# Patient Record
Sex: Female | Born: 1965 | Race: Black or African American | Hispanic: No | Marital: Single | Smoking: Never smoker
Health system: Southern US, Community
[De-identification: ages and names within clinical notes are randomized; demographics above are authoritative.]

## PROBLEM LIST (undated history)

## (undated) ENCOUNTER — Emergency Department (HOSPITAL_COMMUNITY): Admission: EM | Discharge status: Absent without leave | Payer: Self-pay

## (undated) DIAGNOSIS — F333 Major depressive disorder, recurrent, severe with psychotic symptoms: Secondary | ICD-10-CM

## (undated) DIAGNOSIS — I10 Essential (primary) hypertension: Secondary | ICD-10-CM

## (undated) DIAGNOSIS — T50901A Poisoning by unspecified drugs, medicaments and biological substances, accidental (unintentional), initial encounter: Secondary | ICD-10-CM

## (undated) DIAGNOSIS — F22 Delusional disorders: Secondary | ICD-10-CM

## (undated) DIAGNOSIS — Z5900 Homelessness unspecified: Secondary | ICD-10-CM

## (undated) DIAGNOSIS — F200 Paranoid schizophrenia: Secondary | ICD-10-CM

## (undated) DIAGNOSIS — E119 Type 2 diabetes mellitus without complications: Secondary | ICD-10-CM

## (undated) DIAGNOSIS — R251 Tremor, unspecified: Secondary | ICD-10-CM

## (undated) DIAGNOSIS — F419 Anxiety disorder, unspecified: Secondary | ICD-10-CM

## (undated) DIAGNOSIS — F209 Schizophrenia, unspecified: Secondary | ICD-10-CM

---
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
## 2008-12-16 ENCOUNTER — Emergency Department (HOSPITAL_COMMUNITY): Admission: EM | Admit: 2008-12-16 | Discharge: 2008-12-16 | Payer: Self-pay | Admitting: Emergency Medicine

## 2010-11-19 ENCOUNTER — Emergency Department (HOSPITAL_COMMUNITY)
Admission: EM | Admit: 2010-11-19 | Discharge: 2010-11-20 | Disposition: A | Discharge status: Other Acute Inpatient Hospital | Payer: Self-pay | Source: Home / Self Care | Admitting: Emergency Medicine

## 2010-11-20 ENCOUNTER — Inpatient Hospital Stay (HOSPITAL_COMMUNITY)
Admission: AD | Admit: 2010-11-20 | Discharge: 2010-12-09 | DRG: 885 | Disposition: A | Discharge status: Home / Self Care | Payer: Self-pay | Attending: Psychiatry | Admitting: Psychiatry

## 2010-11-20 DIAGNOSIS — E663 Overweight: Secondary | ICD-10-CM

## 2010-11-20 DIAGNOSIS — R45851 Suicidal ideations: Secondary | ICD-10-CM

## 2010-11-20 DIAGNOSIS — F39 Unspecified mood [affective] disorder: Principal | ICD-10-CM

## 2010-11-20 DIAGNOSIS — F29 Unspecified psychosis not due to a substance or known physiological condition: Secondary | ICD-10-CM

## 2010-11-20 DIAGNOSIS — Z560 Unemployment, unspecified: Secondary | ICD-10-CM

## 2010-11-20 LAB — COMPREHENSIVE METABOLIC PANEL
Alkaline Phosphatase: 112 U/L (ref 39–117)
BUN: 19 mg/dL (ref 6–23)
CO2: 25 mEq/L (ref 19–32)
Chloride: 106 mEq/L (ref 96–112)
GFR calc non Af Amer: >60 mL/min (ref >60)
Glucose, Bld: 100 mg/dL — ABNORMAL HIGH (ref 70–99)
Potassium: 4.1 mEq/L (ref 3.5–5.1)
Total Bilirubin: 0.6 mg/dL (ref 0.3–1.2)

## 2010-11-20 LAB — URINALYSIS, ROUTINE W REFLEX MICROSCOPIC
Bilirubin Urine: NEGATIVE
Nitrite: NEGATIVE
Specific Gravity, Urine: 1.025 (ref 1.005–1.030)
Urobilinogen, UA: 0.2 mg/dL (ref 0.0–1.0)

## 2010-11-20 LAB — RAPID URINE DRUG SCREEN, HOSP PERFORMED
Opiates: NOT DETECTED
Tetrahydrocannabinol: NOT DETECTED

## 2010-11-20 LAB — ETHANOL: Alcohol, Ethyl (B): <5 mg/dL (ref 0–10)

## 2010-11-20 LAB — CBC
HCT: 34.3 % — ABNORMAL LOW (ref 36.0–46.0)
Hemoglobin: 11.1 g/dL — ABNORMAL LOW (ref 12.0–15.0)
MCV: 82.1 fL (ref 78.0–100.0)
RBC: 4.18 MIL/uL (ref 3.87–5.11)
RDW: 13.3 % (ref 11.5–15.5)
WBC: 6 10*3/uL (ref 4.0–10.5)

## 2010-11-20 LAB — DIFFERENTIAL
Eosinophils Relative: 4 % (ref 0–5)
Lymphocytes Relative: 42 % (ref 12–46)
Lymphs Abs: 2.5 10*3/uL (ref 0.7–4.0)

## 2010-11-20 LAB — POCT PREGNANCY, URINE: Preg Test, Ur: NEGATIVE

## 2010-11-27 DIAGNOSIS — F411 Generalized anxiety disorder: Secondary | ICD-10-CM

## 2010-11-28 LAB — CBC
MCHC: 31.8 g/dL (ref 30.0–36.0)
Platelets: 274 10*3/uL (ref 150–400)
RDW: 13.5 % (ref 11.5–15.5)
WBC: 4.7 10*3/uL (ref 4.0–10.5)

## 2010-11-28 LAB — HEPATIC FUNCTION PANEL
AST: 18 U/L (ref 0–37)
Albumin: 3.3 g/dL — ABNORMAL LOW (ref 3.5–5.2)

## 2010-11-28 LAB — VALPROIC ACID LEVEL: Valproic Acid Lvl: 59.6 ug/mL (ref 50.0–100.0)

## 2010-12-02 LAB — VALPROIC ACID LEVEL: Valproic Acid Lvl: 71.7 ug/mL (ref 50.0–100.0)
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
## 2010-12-15 ENCOUNTER — Emergency Department (HOSPITAL_COMMUNITY)
Admission: EM | Admit: 2010-12-15 | Discharge: 2010-12-16 | Disposition: A | Discharge status: Other Acute Inpatient Hospital | Payer: Self-pay | Attending: Emergency Medicine | Admitting: Emergency Medicine

## 2010-12-15 DIAGNOSIS — F29 Unspecified psychosis not due to a substance or known physiological condition: Secondary | ICD-10-CM | POA: Insufficient documentation

## 2010-12-15 DIAGNOSIS — R45850 Homicidal ideations: Secondary | ICD-10-CM | POA: Insufficient documentation

## 2010-12-15 DIAGNOSIS — F341 Dysthymic disorder: Secondary | ICD-10-CM | POA: Insufficient documentation

## 2010-12-15 DIAGNOSIS — R45851 Suicidal ideations: Secondary | ICD-10-CM | POA: Insufficient documentation

## 2010-12-15 LAB — DIFFERENTIAL
Basophils Absolute: 0 K/uL (ref 0.0–0.1)
Basophils Relative: 0 % (ref 0–1)
Eosinophils Absolute: 0.2 K/uL (ref 0.0–0.7)
Eosinophils Relative: 3 % (ref 0–5)
Lymphocytes Relative: 27 % (ref 12–46)
Lymphs Abs: 1.8 K/uL (ref 0.7–4.0)
Monocytes Absolute: 0.4 K/uL (ref 0.1–1.0)
Monocytes Relative: 6 % (ref 3–12)
Neutro Abs: 4.2 K/uL (ref 1.7–7.7)
Neutrophils Relative %: 64 % (ref 43–77)

## 2010-12-15 LAB — BASIC METABOLIC PANEL WITH GFR
BUN: 16 mg/dL (ref 6–23)
CO2: 23 meq/L (ref 19–32)
Calcium: 8.6 mg/dL (ref 8.4–10.5)
Chloride: 104 meq/L (ref 96–112)
Creatinine, Ser: 0.8 mg/dL (ref 0.4–1.2)
GFR calc non Af Amer: >60 mL/min
Glucose, Bld: 132 mg/dL — ABNORMAL HIGH (ref 70–99)
Potassium: 3.8 meq/L (ref 3.5–5.1)
Sodium: 135 meq/L (ref 135–145)

## 2010-12-15 LAB — CBC
MCV: 82.2 fL (ref 78.0–100.0)
Platelets: 327 10*3/uL (ref 150–400)
RBC: 3.93 MIL/uL (ref 3.87–5.11)
RDW: 14.1 % (ref 11.5–15.5)
WBC: 6.6 10*3/uL (ref 4.0–10.5)

## 2010-12-15 LAB — ETHANOL

## 2010-12-16 ENCOUNTER — Inpatient Hospital Stay (HOSPITAL_COMMUNITY): Admission: RE | Admit: 2010-12-16 | Payer: Self-pay | Source: Other Acute Inpatient Hospital

## 2010-12-16 ENCOUNTER — Inpatient Hospital Stay (HOSPITAL_COMMUNITY)
Admission: EM | Admit: 2010-12-16 | Discharge: 2010-12-23 | DRG: 885 | Disposition: A | Discharge status: Home / Self Care | Payer: Self-pay | Source: Other Acute Inpatient Hospital | Attending: Psychiatry | Admitting: Psychiatry

## 2010-12-16 DIAGNOSIS — F3164 Bipolar disorder, current episode mixed, severe, with psychotic features: Principal | ICD-10-CM

## 2010-12-16 DIAGNOSIS — F29 Unspecified psychosis not due to a substance or known physiological condition: Secondary | ICD-10-CM

## 2010-12-16 DIAGNOSIS — Z9119 Patient's noncompliance with other medical treatment and regimen: Secondary | ICD-10-CM

## 2010-12-16 DIAGNOSIS — Z91199 Patient's noncompliance with other medical treatment and regimen due to unspecified reason: Secondary | ICD-10-CM

## 2010-12-16 DIAGNOSIS — Z560 Unemployment, unspecified: Secondary | ICD-10-CM

## 2010-12-16 LAB — URINALYSIS, ROUTINE W REFLEX MICROSCOPIC
Hgb urine dipstick: NEGATIVE
Nitrite: NEGATIVE
Protein, ur: NEGATIVE mg/dL
Specific Gravity, Urine: 1.033 — ABNORMAL HIGH (ref 1.005–1.030)
Urobilinogen, UA: 0.2 mg/dL (ref 0.0–1.0)

## 2010-12-16 LAB — PREGNANCY, URINE: Preg Test, Ur: NEGATIVE

## 2010-12-16 LAB — RAPID URINE DRUG SCREEN, HOSP PERFORMED
Amphetamines: NOT DETECTED
Barbiturates: NOT DETECTED
Opiates: NOT DETECTED
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
## 2011-02-11 LAB — COMPREHENSIVE METABOLIC PANEL
ALT: 15 U/L (ref 0–35)
BUN: 21 mg/dL (ref 6–23)
CO2: 24 mEq/L (ref 19–32)
Calcium: 8.7 mg/dL (ref 8.4–10.5)
GFR calc non Af Amer: >60 mL/min (ref >60)
Glucose, Bld: 121 mg/dL — ABNORMAL HIGH (ref 70–99)
Sodium: 136 mEq/L (ref 135–145)
Total Protein: 7.1 g/dL (ref 6.0–8.3)

## 2011-02-11 LAB — URINALYSIS, ROUTINE W REFLEX MICROSCOPIC
Bilirubin Urine: NEGATIVE
Ketones, ur: 15 mg/dL — AB
Nitrite: NEGATIVE
Specific Gravity, Urine: 1.035 — ABNORMAL HIGH (ref 1.005–1.030)
Urobilinogen, UA: 0.2 mg/dL (ref 0.0–1.0)

## 2011-02-11 LAB — POCT PREGNANCY, URINE: Preg Test, Ur: NEGATIVE

## 2011-02-11 LAB — DIFFERENTIAL
Basophils Relative: 0 % (ref 0–1)
Eosinophils Absolute: 0 10*3/uL (ref 0.0–0.7)
Lymphs Abs: 1 10*3/uL (ref 0.7–4.0)
Neutro Abs: 5.3 10*3/uL (ref 1.7–7.7)
Neutrophils Relative %: 82 % — ABNORMAL HIGH (ref 43–77)

## 2011-02-11 LAB — CBC
HCT: 35.5 % — ABNORMAL LOW (ref 36.0–46.0)
Hemoglobin: 12.1 g/dL (ref 12.0–15.0)
MCHC: 34.1 g/dL (ref 30.0–36.0)
MCV: 85.4 fL (ref 78.0–100.0)
RBC: 4.16 MIL/uL (ref 3.87–5.11)
RDW: 13 % (ref 11.5–15.5)

## 2011-02-11 LAB — LIPASE, BLOOD: Lipase: 22 U/L (ref 11–59)

## 2012-03-18 ENCOUNTER — Encounter (HOSPITAL_COMMUNITY): Payer: Self-pay

## 2012-03-18 ENCOUNTER — Emergency Department (HOSPITAL_COMMUNITY): Payer: Self-pay

## 2012-03-18 ENCOUNTER — Emergency Department (HOSPITAL_COMMUNITY)
Admission: EM | Admit: 2012-03-18 | Discharge: 2012-03-18 | Disposition: A | Discharge status: Home / Self Care | Payer: Self-pay | Attending: Emergency Medicine | Admitting: Emergency Medicine

## 2012-03-18 DIAGNOSIS — F411 Generalized anxiety disorder: Secondary | ICD-10-CM | POA: Insufficient documentation

## 2012-03-18 DIAGNOSIS — F22 Delusional disorders: Secondary | ICD-10-CM | POA: Insufficient documentation

## 2012-03-18 DIAGNOSIS — R0602 Shortness of breath: Secondary | ICD-10-CM | POA: Insufficient documentation

## 2012-03-18 DIAGNOSIS — F419 Anxiety disorder, unspecified: Secondary | ICD-10-CM

## 2012-03-18 HISTORY — DX: Anxiety disorder, unspecified: F41.9

## 2012-03-18 MED ORDER — ZIPRASIDONE HCL 20 MG PO CAPS: 20.0000 mg | ORAL_CAPSULE | Freq: Two times a day (BID) | ORAL | Status: DC

## 2012-03-18 MED ORDER — LORAZEPAM 1 MG PO TABS
1.0000 mg | ORAL_TABLET | Freq: Once | ORAL | Status: AC
  Administered 2012-03-18: 1 mg via ORAL
  Filled 2012-03-18: qty 1

## 2012-03-18 MED ORDER — ZIPRASIDONE HCL 20 MG PO CAPS
20.0000 mg | ORAL_CAPSULE | ORAL | Status: AC
  Administered 2012-03-18: 20 mg via ORAL
  Filled 2012-03-18: qty 1

## 2012-03-18 NOTE — ED Notes (Signed)
Pt is attached to the monitor, pt noted to be anxious.

## 2012-03-18 NOTE — ED Notes (Signed)
Pt was brought in by ambulance with sudden onset of exhaustion with SOB this morning. Pt had the same episode last week but went away. Pt claimed that it is worse when she moves.

## 2012-03-18 NOTE — ED Notes (Signed)
Patient transported to X-ray by [facility]
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
## 2012-03-18 NOTE — Discharge Instructions (Signed)
Anxiety and Panic Attacks Your caregiver has informed you that you are having an anxiety or panic attack. There may be many forms of this. Most of the time these attacks come suddenly and without warning. They come at any time of day, including periods of sleep, and at any time of life. They may be strong and unexplained. Although panic attacks are very scary, they are physically harmless. Sometimes the cause of your anxiety is not known. Anxiety is a protective mechanism of the body in its fight or flight mechanism. Most of these perceived danger situations are actually nonphysical situations (such as anxiety over losing a job). CAUSES  The causes of an anxiety or panic attack are many. Panic attacks may occur in otherwise healthy people given a certain set of circumstances. There may be a genetic cause for panic attacks. Some medications may also have anxiety as a side effect. SYMPTOMS  Some of the most common feelings are:  Intense terror.   Dizziness, feeling faint.   Hot and cold flashes.   Fear of going crazy.   Feelings that nothing is real.   Sweating.   Shaking.   Chest pain or a fast heartbeat (palpitations).   Smothering, choking sensations.   Feelings of impending doom and that death is near.   Tingling of extremities, this may be from over-breathing.   Altered reality (derealization).   Being detached from yourself (depersonalization).  Several symptoms can be present to make up anxiety or panic attacks. DIAGNOSIS  The evaluation by your caregiver will depend on the type of symptoms you are experiencing. The diagnosis of anxiety or panic attack is made when no physical illness can be determined to be a cause of the symptoms. TREATMENT  Treatment to prevent anxiety and panic attacks may include:  Avoidance of circumstances that cause anxiety.   Reassurance and relaxation.   Regular exercise.   Relaxation therapies, such as yoga.   Psychotherapy with a  psychiatrist or therapist.   Avoidance of caffeine, alcohol and illegal drugs.   Prescribed medication.  SEEK IMMEDIATE MEDICAL CARE IF:   You experience panic attack symptoms that are different than your usual symptoms.   You have any worsening or concerning symptoms.  Document Released: 10/13/2005 Document Revised: 10/02/2011 Document Reviewed: 02/14/2010 [facility] Patient Information 2012 [place], [place].Paranoia Paranoia is a distrust of others that is not based on a real reason for distrust. This may reach delusional levels. This means the paranoid person feels the world is against them when there is no reason to make them feel that way. People with paranoia feel as though people around them are "out to get them".  SIMILAR MENTAL ILNESSES  Depression is a feeling as though you are down all the time. It is normal in some situations where you have just lost a loved one. It is abnormal if you are having feelings of paranoia with it.   Dementia is a physical problem with the brain in which the brain no longer works properly. There are problems with daily activities of living. Alzheimer's disease is one example of this. Dementia is also caused by old age changes in the brain which come with the death of brain cells and small strokes.   Paranoidschizophrenia. People with paranoid schizophrenia and persecutory delusional disorder have delusions in which they feel people around them are plotting against them. Persecutory delusions in paranoid schizophrenia are bizarre, sometimes grandiose, and often accompanied by auditory hallucinations. This means the person is hearing voices that are  not there.   Delusionaldisorder (persecutory type). Delusions experienced by individuals with delusional disorder are more believable than those experienced by paranoid schizophrenics; they are not bizarre, though still unjustified. Individuals with delusional disorder may seem offbeat or quirky rather than mentally  ill, and therefore, may never seek treatment.  All of these problems usually do not allow these people to interact socially in an acceptable manner. CAUSES The cause of paranoia is often not known. It is common in people with extended abuse of:  Cocaine.   Amphetamine.   Marijuana.   Alcohol.  Sometimes there is an inherited tendency. It may be associated with stress or changes in brain chemistry. DIAGNOSIS  When paranoia is present, your caregiver may:  Refer you to a specialist.   Do a physical exam.   Perform other tests on you to make sure there are not other problems causing the paranoia including:   Physical problems.   Mental problems.   Chemical problems (other than drugs).  Testing may be done to determine if there is a psychiatric disability present that can be treated with medicine. TREATMENT   Paranoia that is a symptom of a psychiatric problem should be treated by professionals.   Medicines are available which can help this disorder. Antipsychotic medicine may be prescribed by your caregiver.   Sometimes psychotherapy may be useful.   Conditions such as depression or drug abuse are treated individually. If the paranoia is caused by drug abuse, a treatment facility may be helpful. Depression may be helped by antidepressants.  PROGNOSIS   Paranoid people are difficult to treat because of their belief that everyone is out to get them or harm them. Because of this mistrust, they often must be talked into entering treatment by a trusted family member or friend. They may not want to take medicine as they may see this as an attempt to poison them.   Gradual gains in the trust of a therapist or caregiver helps in a successful treatment plan.   Some people with PPD or persecutory delusional disorder function in society without treatment in limited fashion.  Document Released: 10/16/2003 Document Revised: 10/02/2011 Document Reviewed: 06/20/2008 [facility] Patient  Information 2012 [place], [place].

## 2012-03-18 NOTE — ED Provider Notes (Signed)
History     CSN: [id number]  Arrival date & time 03/18/12  1033   First MD Initiated Contact with Patient 03/18/12 1047      Chief Complaint  Patient presents with  . Shortness of Breath    (Consider location/radiation/quality/duration/timing/severity/associated sxs/prior treatment) HPI Comments: Pt feeling very anxious today.  Pt says there have been people harassing her and this makes her feel upset.  Earlier was anxious and develops dyspnea while sitting still.  Intermittent chest tightness that is moving throughout he chest.  Now improved.  Has not had previously.  Not worse with inspiration or exertion.  Otherwise doing well.  Police involved with harassment and none of the alleged offenders live with the pt.  Patient is a 46 y.o. female presenting with shortness of breath. The history is provided by the patient.  Shortness of Breath  The current episode started today. The problem occurs continuously. The problem has been unchanged. The problem is mild. The symptoms are relieved by nothing. The symptoms are aggravated by nothing. Associated symptoms include chest pain (chest tightness moving around her chest first left, then right, now gone) and shortness of breath. Pertinent negatives include no fever and no cough.    Past Medical History  Diagnosis Date  . Anxiety     History reviewed. No pertinent past surgical history.  No family history on file.  History  Substance Use Topics  . Smoking status: Never Smoker   . Smokeless tobacco: Not on file  . Alcohol Use: No    OB History    Grav Para Term Preterm Abortions TAB SAB Ect Mult Living                  Review of Systems  Constitutional: Negative for fever and activity change.  HENT: Negative for congestion.   Eyes: Negative for visual disturbance.  Respiratory: Positive for shortness of breath. Negative for cough and chest tightness.   Cardiovascular: Positive for chest pain (chest tightness moving around her  chest first left, then right, now gone). Negative for leg swelling.  Gastrointestinal: Negative for abdominal pain.  Genitourinary: Negative for dysuria.  Skin: Negative for rash.  Neurological: Negative for syncope.  Psychiatric/Behavioral: Negative for behavioral problems.    Allergies  Review of patient's allergies indicates no known allergies.  Home Medications   Current Outpatient Rx  Name Route Sig Dispense Refill  . ZIPRASIDONE HCL 20 MG PO CAPS Oral Take 1 capsule (20 mg total) by mouth 2 (two) times daily with a meal. 30 capsule 0    BP 133/80  Pulse 83  Temp(Src) 97.8 F (36.6 C) (Oral)  Resp 18  Ht 5\' 5"  (1.651 m)  SpO2 100%  Physical Exam  Constitutional: She is oriented to person, place, and time. She appears well-developed and well-nourished.  HENT:  Head: Normocephalic and atraumatic.  Eyes: Conjunctivae and EOM are normal. Pupils are equal, round, and reactive to light. No scleral icterus.  Neck: Normal range of motion. Neck supple.  Cardiovascular: Normal rate and regular rhythm.  Exam reveals no gallop and no friction rub.   No murmur heard. Pulmonary/Chest: Effort normal and breath sounds normal. No respiratory distress. She has no wheezes. She has no rales. She exhibits no tenderness.  Abdominal: Soft. She exhibits no distension and no mass. There is no tenderness. There is no rebound and no guarding.  Musculoskeletal: Normal range of motion.  Neurological: She is alert and oriented to person, place, and time. She has normal  reflexes. No cranial nerve deficit.  Skin: Skin is warm and dry. No rash noted.  Psychiatric: Her behavior is normal. Judgment and thought content normal.       Anxious appearing.    ED Course  Procedures (including critical care time)   Date: 03/18/2012  Rate: 74  Rhythm: normal sinus rhythm  QRS Axis: normal  Intervals: normal  ST/T Wave abnormalities: nonspecific T wave changes  Conduction Disutrbances:none  Narrative  Interpretation:   Old EKG Reviewed: none available    Labs Reviewed - No data to display Dg Chest 2 View  03/18/2012  *RADIOLOGY REPORT*  Clinical Data: Shortness of breath.  CHEST - 2 VIEW  Comparison: 11/19/2010  Findings: Heart and mediastinal contours are within normal limits. No focal opacities or effusions.  No acute bony abnormality.  IMPRESSION: No active cardiopulmonary disease.  Original Report Authenticated By: Cyndie Chime, M.D.     1. Anxiety   2. Delusions       MDM  Pt feeling very anxious today.  Pt says there have been people harassing her and this makes her feel upset.  Earlier was anxious and develops dyspnea while sitting still.  Intermittent chest tightness that is moving throughout he chest.  Now improved.  Has not had previously.  Not worse with inspiration or exertion.  Otherwise doing well.  Police involved with harassment and none of the alleged offenders live with the pt.  VSS and well appearing.  Atypical presentation for ACS, PE.  EKG, CXR unconcerning.  After ativan dose pt is feeling better and without symptoms.  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  Feel pt is safe to go home.  No SI, no HI, and no psychosis, but will have psych talk with pt to try to arrange f/u and counsel pt.  3:50 PM Tele psych has d/w pt.  Does not feel pt is a danger to herself for others.  Recommends starting geodon and arranging f/u.  Social work to arrange psych f/u for pt and provide info.  Pt comfortable with plan and says she will start taking meds and f/u.        Army Chaco, MD 03/18/12 [phone]

## 2012-03-29 NOTE — ED Provider Notes (Signed)
I saw and evaluated the patient, reviewed the resident's note and I agree with the findings and plan.  Jalayne Ganesh, MD 03/29/12 0900 

## 2012-03-30 ENCOUNTER — Emergency Department (HOSPITAL_COMMUNITY)
Admission: EM | Admit: 2012-03-30 | Discharge: 2012-03-31 | Disposition: A | Discharge status: Home / Self Care | Payer: Self-pay | Attending: Emergency Medicine | Admitting: Emergency Medicine

## 2012-03-30 ENCOUNTER — Encounter (HOSPITAL_COMMUNITY): Payer: Self-pay | Admitting: *Deleted

## 2012-03-30 DIAGNOSIS — R1031 Right lower quadrant pain: Secondary | ICD-10-CM | POA: Insufficient documentation

## 2012-03-30 DIAGNOSIS — F411 Generalized anxiety disorder: Secondary | ICD-10-CM | POA: Insufficient documentation

## 2012-03-30 DIAGNOSIS — R110 Nausea: Secondary | ICD-10-CM | POA: Insufficient documentation

## 2012-03-30 MED ORDER — PROMETHAZINE HCL 25 MG PO TABS: 25.0000 mg | ORAL_TABLET | Freq: Four times a day (QID) | ORAL | Status: DC | PRN

## 2012-03-30 MED ORDER — ONDANSETRON 8 MG PO TBDP
8.0000 mg | ORAL_TABLET | Freq: Once | ORAL | Status: DC
  Filled 2012-03-30: qty 1

## 2012-03-30 MED ORDER — ONDANSETRON 4 MG PO TBDP: 4.0000 mg | ORAL_TABLET | Freq: Three times a day (TID) | ORAL | Status: DC | PRN

## 2012-03-30 NOTE — ED Notes (Signed)
Pt reports to this RN that "I ate some candy from my apartment and I think it had maggots in it, and it made me so sick that I felt like I was going to pass out." Pt denies complaints of pain. Only complaint is nausea. States "I had some stuff stuck in my sink and I think it was maggots."

## 2012-03-30 NOTE — Discharge Instructions (Signed)
If you ate a maggot - it will not cause any long term problems - it is benign and your symptoms should go away over night.  Take zofran or phenergan for nausea

## 2012-03-30 NOTE — ED Provider Notes (Addendum)
History     CSN: [id number]  Arrival date & time 03/30/12  2301   First MD Initiated Contact with Patient 03/30/12 2336      Chief Complaint  Patient presents with  . Nausea    (Consider location/radiation/quality/duration/timing/severity/associated sxs/prior treatment) HPI Comments: 47 year old female who presents after eating maggots accidentally.  She states that she was eating a bag of hard candy at home when she ate something that did not feeling of hard candy, she looked and saw that there were maggots in the food, since that time she has been nauseated. She denies any pain, denies any fevers, denies any swelling  The history is provided by the patient.    Past Medical History  Diagnosis Date  . Anxiety     History reviewed. No pertinent past surgical history.  History reviewed. No pertinent family history.  History  Substance Use Topics  . Smoking status: Never Smoker   . Smokeless tobacco: Not on file  . Alcohol Use: No    OB History    Grav Para Term Preterm Abortions TAB SAB Ect Mult Living                  Review of Systems  Constitutional: Negative for fever.  HENT: Negative for sore throat.   Respiratory: Negative for cough, chest tightness and shortness of breath.   Cardiovascular: Negative for chest pain.  Gastrointestinal: Positive for nausea. Negative for abdominal pain.    Allergies  Review of patient's allergies indicates no known allergies.  Home Medications   Current Outpatient Rx  Name Route Sig Dispense Refill  . ONDANSETRON 4 MG PO TBDP Oral Take 1 tablet (4 mg total) by mouth every 8 (eight) hours as needed for nausea. 10 tablet 0  . PROMETHAZINE HCL 25 MG PO TABS Oral Take 1 tablet (25 mg total) by mouth every 6 (six) hours as needed for nausea. 12 tablet 0    BP 129/63  Pulse 83  Temp(Src) 97.8 F (36.6 C) (Oral)  Resp 20  SpO2 100%  Physical Exam  Constitutional: She appears well-developed and well-nourished.  HENT:    Head: Normocephalic and atraumatic.       No foreign bodies in the mouth, mucous membranes are moist  Eyes: Conjunctivae are normal. No scleral icterus.  Cardiovascular: Normal rate, regular rhythm and intact distal pulses.   No murmur heard. Pulmonary/Chest: Effort normal and breath sounds normal. No respiratory distress. She has no wheezes. She has no rales.  Abdominal: Soft. There is no tenderness.  Neurological: She is alert. Coordination normal.  Skin: Skin is warm and dry. No rash noted.  Psychiatric:       Upset, tearful    ED Course  Procedures (including critical care time)  Labs Reviewed - No data to display No results found.   1. Nausea       MDM  Nausea likely from ingestion, patient does not appear toxic in any way shape or form, Zofran given, home with antiemetics and reassurance  Vida Roller, MD 03/30/12 2348   The patient initially refused to give any more information and was given an initial history of this note. She was discharged, she stood in the waiting room, began to scream and yell at people and stated that she thought she was going to pass out. She was brought back to an examination room and now complains of right lower quadrant pain. She states that her nausea has completely resolved. On repeat exam she  does have tenderness in her right lower quadrant which was not present on her initial exam. She does not have any history of surgical procedures on her abdomen. Will proceed with workup for right lower quadrant pain   0500 AM Pt has now refused all interventions other than urinalysis - negative for infection - refuses IVF - has tolerated lots of PO fluids since arrival.  Pt declines pain medicines at this time.  Sleeping, no pain on reexam.  VS normal.  D/c home.  Vida Roller, MD 03/31/12 [phone]

## 2012-03-30 NOTE — ED Notes (Signed)
Per EMS pt non-verbal, reports [place] and [place]. Pt will not state what is wrong.

## 2012-03-31 ENCOUNTER — Emergency Department (HOSPITAL_COMMUNITY): Payer: Self-pay

## 2012-03-31 LAB — URINALYSIS, ROUTINE W REFLEX MICROSCOPIC
Glucose, UA: NEGATIVE mg/dL
Hgb urine dipstick: NEGATIVE
Ketones, ur: 40 mg/dL — AB
Protein, ur: NEGATIVE mg/dL

## 2012-03-31 MED ORDER — MORPHINE SULFATE 4 MG/ML IJ SOLN: 2.0000 mg | Freq: Once | INTRAMUSCULAR | Status: DC

## 2012-03-31 NOTE — ED Notes (Signed)
Pt still in bed.  Security called.

## 2012-03-31 NOTE — ED Notes (Signed)
Again asked pt to get up and get dressed to be discharged.  Pt still in bed.

## 2012-03-31 NOTE — ED Notes (Signed)
Pt was discharged and was refusing to go and asking for everyones names and numbers, charge RN tried to speak with patient when she began raising her voice and cursing at charge RN, pt was then escorted to the lobby by security and [employer], patient insisted on showing everyone her PHD ID card from [facility], while in the lobby patient said that we were required to give her a cab voucher and charge RN explained that we did did provide those anymore but could give her a bus pass, pt then said she had money for a cab but then said she didn't, she then began to hyperventilate and complain of being nauseated, patient was then readmitted to room 25 for further evaluation.

## 2012-03-31 NOTE — ED Notes (Signed)
Pt came out and told me that we were never going to treat her like this again, pt st's I never went in and told her it was time for her to be discharged.  I explained to the patient why we got security involved.

## 2012-03-31 NOTE — ED Notes (Signed)
Pt states she does not want a CT scan done and only wants to have a urinalysis done because she feels like she has a very bad urinary tract infection. Pt refuses IV morphine stating that her pain is not that bad.

## 2012-03-31 NOTE — ED Notes (Signed)
Again asked pt to get dressed because she has been discharged.  Pt sill in bed.

## 2012-03-31 NOTE — ED Notes (Addendum)
Pt states she feels sick on the stomach and feels like is shaking all over. No physical shaking noted at this time.

## 2012-03-31 NOTE — ED Notes (Signed)
Encouraged pt to drink water which was left at the bedside in order to get an urine sample. Pt states she would drink but drifts back to sleep.

## 2012-03-31 NOTE — ED Notes (Signed)
Asked pt to get up and get dressed, pt has been discharged.  Pt remained in bed.

## 2012-03-31 NOTE — ED Notes (Signed)
Attempted to start IV and blood work x 2 but was unsuccessful.  Blood work was also attempted to be drawn by C. Sok, NT x2 but was unsuccessful. Pt stated she did not want any further attempts made.

## 2012-04-01 ENCOUNTER — Emergency Department (HOSPITAL_COMMUNITY): Payer: Self-pay

## 2012-04-01 ENCOUNTER — Encounter (HOSPITAL_COMMUNITY): Payer: Self-pay | Admitting: *Deleted

## 2012-04-01 ENCOUNTER — Emergency Department (HOSPITAL_COMMUNITY)
Admission: EM | Admit: 2012-04-01 | Discharge: 2012-04-01 | Disposition: A | Discharge status: Home / Self Care | Payer: No Typology Code available for payment source | Attending: Emergency Medicine | Admitting: Emergency Medicine

## 2012-04-01 DIAGNOSIS — IMO0002 Reserved for concepts with insufficient information to code with codable children: Secondary | ICD-10-CM | POA: Insufficient documentation

## 2012-04-01 DIAGNOSIS — W19XXXA Unspecified fall, initial encounter: Secondary | ICD-10-CM | POA: Insufficient documentation

## 2012-04-01 DIAGNOSIS — F411 Generalized anxiety disorder: Secondary | ICD-10-CM | POA: Insufficient documentation

## 2012-04-01 DIAGNOSIS — S8390XA Sprain of unspecified site of unspecified knee, initial encounter: Secondary | ICD-10-CM

## 2012-04-01 DIAGNOSIS — M25559 Pain in unspecified hip: Secondary | ICD-10-CM | POA: Insufficient documentation

## 2012-04-01 MED ORDER — NAPROXEN 500 MG PO TABS: 500.0000 mg | ORAL_TABLET | Freq: Two times a day (BID) | ORAL | Status: DC

## 2012-04-01 MED ORDER — IBUPROFEN 200 MG PO TABS
600.0000 mg | ORAL_TABLET | Freq: Once | ORAL | Status: AC
  Administered 2012-04-01: 600 mg via ORAL
  Filled 2012-04-01: qty 3

## 2012-04-01 MED ORDER — ERYTHROMYCIN 5 MG/GM OP OINT: TOPICAL_OINTMENT | OPHTHALMIC | Status: DC

## 2012-04-01 NOTE — ED Provider Notes (Signed)
History     CSN: [id number]  Arrival date & time 04/01/12  1356   First MD Initiated Contact with Patient 04/01/12 1406      Chief Complaint  Patient presents with  . Knee Pain    HPI Pt was walking and slipped on the floor and landed on her knee.  Pt has pain in her left knee now.  No other injuries. She denies any back pain or numbness or weakness. She did not hit her head or lose consciousness. Patient mention to EMS about having some issues with depression but when I asked her about this patient states now that she did not want to see anyone regarding depression or any psychiatric issues. She states that EMS was just ask her she had been having some trouble with stress and she said yes.  Past Medical History  Diagnosis Date  . Anxiety     No past surgical history on file.  No family history on file.  History  Substance Use Topics  . Smoking status: Never Smoker   . Smokeless tobacco: Not on file  . Alcohol Use: No    OB History    Grav Para Term Preterm Abortions TAB SAB Ect Mult Living                  Review of Systems  All other systems reviewed and are negative.    Allergies  Review of patient's allergies indicates no known allergies.  Home Medications   Current Outpatient Rx  Name Route Sig Dispense Refill  . ONDANSETRON 4 MG PO TBDP Oral Take 1 tablet (4 mg total) by mouth every 8 (eight) hours as needed for nausea. 10 tablet 0  . PROMETHAZINE HCL 25 MG PO TABS Oral Take 1 tablet (25 mg total) by mouth every 6 (six) hours as needed for nausea. 12 tablet 0    SpO2 100%  Physical Exam  Nursing note and vitals reviewed. Constitutional: She appears well-developed and well-nourished. No distress.       Obese  HENT:  Head: Normocephalic and atraumatic.  Right Ear: External ear normal.  Left Ear: External ear normal.  Eyes: Conjunctivae are normal. Right eye exhibits no discharge. Left eye exhibits no discharge. No scleral icterus.       Mild lid  edema right eye, mild conjunctival injection  Neck: Neck supple. No tracheal deviation present.  Cardiovascular: Normal rate, regular rhythm and intact distal pulses.   Pulmonary/Chest: Effort normal and breath sounds normal. No stridor. No respiratory distress. She has no wheezes. She has no rales.  Abdominal: Soft. Bowel sounds are normal. She exhibits no distension. There is no tenderness. There is no rebound and no guarding.  Musculoskeletal: She exhibits no edema and no tenderness.       Left hip: She exhibits tenderness. She exhibits no bony tenderness, no swelling and no crepitus.       Left knee: She exhibits normal range of motion, no swelling, no effusion, no deformity, normal alignment, no LCL laxity and normal patellar mobility. tenderness found. Medial joint line and lateral joint line tenderness noted.       Lumbar back: She exhibits no tenderness, no bony tenderness and no swelling.  Neurological: She is alert. She has normal strength. No sensory deficit. Cranial nerve deficit:  no gross defecits noted. She exhibits normal muscle tone. She displays no seizure activity. Coordination normal.  Skin: Skin is warm and dry. No rash noted.  Psychiatric: She has a normal  mood and affect.    ED Course  Procedures (including critical care time)  Labs Reviewed - No data to display Dg Hip Complete Left  04/01/2012  *RADIOLOGY REPORT*  Clinical Data: Left hip and knee pain post fall, limited range of motion  LEFT HIP - COMPLETE 2+ VIEW  Comparison: None  Findings: Symmetric hip and SI joints. Osseous mineralization normal. No acute fracture, dislocation or bone destruction.  IMPRESSION: No acute abnormalities.  Original Report Authenticated By: Lollie Marrow, M.D.   Dg Knee Complete 4 Views Left  04/01/2012  *RADIOLOGY REPORT*  Clinical Data:  Left hip and knee pain post fall, limited range of motion  LEFT KNEE - COMPLETE 4+ VIEW  Comparison: None  Findings: Osseous mineralization grossly  normal. Joint spaces preserved. No acute fracture, dislocation or bone destruction. Slight obliquity on lateral view. No definite knee joint effusion.  IMPRESSION: No definite acute bony abnormalities.  Original Report Authenticated By: Lollie Marrow, M.D.      MDM  Patient does not have any signs of serious injuries. Discharge her home with crutches and some anti-inflammatory medications to take as needed for her pain.        Celene Kras, MD 04/01/12 [phone]

## 2012-04-01 NOTE — Discharge Instructions (Signed)
Knee Pain The knee is the complex joint between your thigh and your lower leg. It is made up of bones, tendons, ligaments, and cartilage. The bones that make up the knee are:  The femur in the thigh.   The tibia and fibula in the lower leg.   The patella or kneecap riding in the groove on the lower femur.  CAUSES  Knee pain is a common complaint with many causes. A few of these causes are:  Injury, such as:   A ruptured ligament or tendon injury.   Torn cartilage.   Medical conditions, such as:   Gout   Arthritis   Infections   Overuse, over training or overdoing a physical activity.  Knee pain can be minor or severe. Knee pain can accompany debilitating injury. Minor knee problems often respond well to self-care measures or get well on their own. More serious injuries may need medical intervention or even surgery. SYMPTOMS The knee is complex. Symptoms of knee problems can vary widely. Some of the problems are:  Pain with movement and weight bearing.   Swelling and tenderness.   Buckling of the knee.   Inability to straighten or extend your knee.   Your knee locks and you cannot straighten it.   Warmth and redness with pain and fever.   Deformity or dislocation of the kneecap.  DIAGNOSIS  Determining what is wrong may be very straight forward such as when there is an injury. It can also be challenging because of the complexity of the knee. Tests to make a diagnosis may include:  Your caregiver taking a history and doing a physical exam.   Routine X-rays can be used to rule out other problems. X-rays will not reveal a cartilage tear. Some injuries of the knee can be diagnosed by:   Arthroscopy a surgical technique by which a small video camera is inserted through tiny incisions on the sides of the knee. This procedure is used to examine and repair internal knee joint problems. Tiny instruments can be used during arthroscopy to repair the torn knee cartilage  (meniscus).   Arthrography is a radiology technique. A contrast liquid is directly injected into the knee joint. Internal structures of the knee joint then become visible on X-ray film.   An MRI scan is a non x-ray radiology procedure in which magnetic fields and a computer produce two- or three-dimensional images of the inside of the knee. Cartilage tears are often visible using an MRI scanner. MRI scans have largely replaced arthrography in diagnosing cartilage tears of the knee.   Blood work.   Examination of the fluid that helps to lubricate the knee joint (synovial fluid). This is done by taking a sample out using a needle and a syringe.  TREATMENT The treatment of knee problems depends on the cause. Some of these treatments are:  Depending on the injury, proper casting, splinting, surgery or physical therapy care will be needed.   Give yourself adequate recovery time. Do not overuse your joints. If you begin to get sore during workout routines, back off. Slow down or do fewer repetitions.   For repetitive activities such as cycling or running, maintain your strength and nutrition.   Alternate muscle groups. For example if you are a weight lifter, work the upper body on one day and the lower body the next.   Either tight or weak muscles do not give the proper support for your knee. Tight or weak muscles do not absorb the stress placed   on the knee joint. Keep the muscles surrounding the knee strong.   Take care of mechanical problems.   If you have flat feet, orthotics or special shoes may help. See your caregiver if you need help.   Arch supports, sometimes with wedges on the inner or outer aspect of the heel, can help. These can shift pressure away from the side of the knee most bothered by osteoarthritis.   A brace called an "unloader" brace also may be used to help ease the pressure on the most arthritic side of the knee.   If your caregiver has prescribed crutches, braces,  wraps or ice, use as directed. The acronym for this is PRICE. This means protection, rest, ice, compression and elevation.   Nonsteroidal anti-inflammatory drugs (NSAID's), can help relieve pain. But if taken immediately after an injury, they may actually increase swelling. Take NSAID's with food in your stomach. Stop them if you develop stomach problems. Do not take these if you have a history of ulcers, stomach pain or bleeding from the bowel. Do not take without your caregiver's approval if you have problems with fluid retention, heart failure, or kidney problems.   For ongoing knee problems, physical therapy may be helpful.   Glucosamine and chondroitin are over-the-counter dietary supplements. Both may help relieve the pain of osteoarthritis in the knee. These medicines are different from the usual anti-inflammatory drugs. Glucosamine may decrease the rate of cartilage destruction.   Injections of a corticosteroid drug into your knee joint may help reduce the symptoms of an arthritis flare-up. They may provide pain relief that lasts a few months. You may have to wait a few months between injections. The injections do have a small increased risk of infection, water retention and elevated blood sugar levels.   Hyaluronic acid injected into damaged joints may ease pain and provide lubrication. These injections may work by reducing inflammation. A series of shots may give relief for as long as 6 months.   Topical painkillers. Applying certain ointments to your skin may help relieve the pain and stiffness of osteoarthritis. Ask your pharmacist for suggestions. Many over the-counter products are approved for temporary relief of arthritis pain.   In some countries, doctors often prescribe topical NSAID's for relief of chronic conditions such as arthritis and tendinitis. A review of treatment with NSAID creams found that they worked as well as oral medications but without the serious side effects.    PREVENTION  Maintain a healthy weight. Extra pounds put more strain on your joints.   Get strong, stay limber. Weak muscles are a common cause of knee injuries. Stretching is important. Include flexibility exercises in your workouts.   Be smart about exercise. If you have osteoarthritis, chronic knee pain or recurring injuries, you may need to change the way you exercise. This does not mean you have to stop being active. If your knees ache after jogging or playing basketball, consider switching to swimming, water aerobics or other low-impact activities, at least for a few days a week. Sometimes limiting high-impact activities will provide relief.   Make sure your shoes fit well. Choose footwear that is right for your sport.   Protect your knees. Use the proper gear for knee-sensitive activities. Use kneepads when playing volleyball or laying carpet. Buckle your seat belt every time you drive. Most shattered kneecaps occur in car accidents.   Rest when you are tired.  SEEK MEDICAL CARE IF:  You have knee pain that is continual and does not   seem to be getting better.  SEEK IMMEDIATE MEDICAL CARE IF:  Your knee joint feels hot to the touch and you have a high fever. MAKE SURE YOU:   Understand these instructions.   Will watch your condition.   Will get help right away if you are not doing well or get worse.  Document Released: 08/10/2007 Document Revised: 10/02/2011 Document Reviewed: 08/10/2007 [employer] Patient Information 2012 [employer]. 

## 2012-04-01 NOTE — ED Notes (Signed)
Pt in via [facility] from [facility] c/o L knee pain post fall, pt tripped & landed on concrete, pt reports pain on weight bearing & ambulation, no contusion, swelling or bleeding present, pt A&Ox 4, pt stated to EMS, "I have been depressed & would like to talk to someone about getting some medication." no psych hx or depression hx

## 2012-04-01 NOTE — Progress Notes (Signed)
Orthopedic Tech Progress Note Patient Details:  [name] 03-12-1966 [id number]  Ortho Devices Type of Ortho Device: Crutches;Knee Sleeve Ortho Device/Splint Location: (L) LE Ortho Device/Splint Interventions: Application;Ordered   Jennye Moccasin 04/01/2012, 4:09 PM

## 2012-04-07 ENCOUNTER — Emergency Department (HOSPITAL_COMMUNITY)
Admission: EM | Admit: 2012-04-07 | Discharge: 2012-04-08 | Disposition: A | Discharge status: Home / Self Care | Payer: Self-pay | Attending: Emergency Medicine | Admitting: Emergency Medicine

## 2012-04-07 ENCOUNTER — Encounter (HOSPITAL_COMMUNITY): Payer: Self-pay | Admitting: Emergency Medicine

## 2012-04-07 DIAGNOSIS — Z79899 Other long term (current) drug therapy: Secondary | ICD-10-CM | POA: Insufficient documentation

## 2012-04-07 DIAGNOSIS — F333 Major depressive disorder, recurrent, severe with psychotic symptoms: Secondary | ICD-10-CM | POA: Insufficient documentation

## 2012-04-07 DIAGNOSIS — F419 Anxiety disorder, unspecified: Secondary | ICD-10-CM

## 2012-04-07 DIAGNOSIS — R42 Dizziness and giddiness: Secondary | ICD-10-CM

## 2012-04-07 DIAGNOSIS — F411 Generalized anxiety disorder: Secondary | ICD-10-CM | POA: Insufficient documentation

## 2012-04-07 DIAGNOSIS — I498 Other specified cardiac arrhythmias: Secondary | ICD-10-CM | POA: Insufficient documentation

## 2012-04-07 HISTORY — DX: Major depressive disorder, recurrent, severe with psychotic symptoms: F33.3

## 2012-04-07 LAB — GLUCOSE, CAPILLARY: Glucose-Capillary: 142 mg/dL — ABNORMAL HIGH (ref 70–99)

## 2012-04-07 LAB — CBC
HCT: 36 % (ref 36.0–46.0)
Hemoglobin: 11.6 g/dL — ABNORMAL LOW (ref 12.0–15.0)
MCV: 80 fL (ref 78.0–100.0)
RDW: 13.8 % (ref 11.5–15.5)
WBC: 5.7 10*3/uL (ref 4.0–10.5)

## 2012-04-07 LAB — URINALYSIS, ROUTINE W REFLEX MICROSCOPIC
Bilirubin Urine: NEGATIVE
Glucose, UA: NEGATIVE mg/dL
Hgb urine dipstick: NEGATIVE
Ketones, ur: NEGATIVE mg/dL
Leukocytes, UA: NEGATIVE
Protein, ur: NEGATIVE mg/dL
pH: 5 (ref 5.0–8.0)

## 2012-04-07 LAB — BASIC METABOLIC PANEL
BUN: 21 mg/dL (ref 6–23)
CO2: 24 mEq/L (ref 19–32)
Calcium: 9.9 mg/dL (ref 8.4–10.5)
Chloride: 104 mEq/L (ref 96–112)
Creatinine, Ser: 0.96 mg/dL (ref 0.50–1.10)
Glucose, Bld: 108 mg/dL — ABNORMAL HIGH (ref 70–99)

## 2012-04-07 LAB — DIFFERENTIAL
Eosinophils Relative: 3 % (ref 0–5)
Lymphocytes Relative: 40 % (ref 12–46)
Lymphs Abs: 2.3 10*3/uL (ref 0.7–4.0)
Monocytes Absolute: 0.3 10*3/uL (ref 0.1–1.0)
Monocytes Relative: 5 % (ref 3–12)
Neutro Abs: 3 10*3/uL (ref 1.7–7.7)

## 2012-04-07 MED ORDER — ALPRAZOLAM 0.5 MG PO TABS: 1.0000 mg | ORAL_TABLET | Freq: Three times a day (TID) | ORAL | Status: DC | PRN

## 2012-04-07 MED ORDER — SODIUM CHLORIDE 0.9 % IV BOLUS (SEPSIS)
1000.0000 mL | Freq: Once | INTRAVENOUS | Status: AC
  Administered 2012-04-07: 1000 mL via INTRAVENOUS

## 2012-04-07 NOTE — Discharge Instructions (Signed)
Near-Syncope Near-syncope is sudden weakness, dizziness, or feeling like you might pass out (faint). This may occur when getting up after sitting or while standing for a long period of time. Near-syncope can be caused by a drop in blood pressure. This is a common reaction, but it may occur to a greater degree in people taking medicines to control their blood pressure. Fainting often occurs when the blood pressure or pulse is too low to provide enough blood flow to the brain to keep you conscious. Fainting and near-syncope are not usually due to serious medical problems. However, certain people should be more cautious in the event of near-syncope, including elderly patients, patients with diabetes, and patients with a history of heart conditions (especially irregular rhythms).  CAUSES   Drop in blood pressure.   Physical pain.   Dehydration.   Heat exhaustion.   Emotional distress.   Low blood sugar.   Internal bleeding.   Heart and circulatory problems.   Infections.  SYMPTOMS   Dizziness.   Feeling sick to your stomach (nauseous).   Nearly fainting.   Body numbness.   Turning pale.   Tunnel vision.   Weakness.  HOME CARE INSTRUCTIONS   Lie down right away if you start feeling like you might faint. Breathe deeply and steadily. Wait until all the symptoms have passed. Most of these episodes last only a few minutes. You may feel tired for several hours.   Drink enough fluids to keep your urine clear or pale yellow.   If you are taking blood pressure or heart medicine, get up slowly, taking several minutes to sit and then stand. This can reduce dizziness that is caused by a drop in blood pressure.  SEEK IMMEDIATE MEDICAL CARE IF:   You have a severe headache.   Unusual pain develops in the chest, abdomen, or back.   There is bleeding from the mouth or rectum, or you have black or tarry stool.   An irregular heartbeat or a very rapid pulse develops.   You have  repeated fainting or seizure-like jerking during an episode.   You faint when sitting or lying down.   You develop confusion.   You have difficulty walking.   Severe weakness develops.   Vision problems develop.  MAKE SURE YOU:   Understand these instructions.   Will watch your condition.   Will get help right away if you are not doing well or get worse.  Document Released: 10/13/2005 Document Revised: 10/02/2011 Document Reviewed: 11/29/2010 [facility] Patient Information 2012 [place], [place].Anxiety and Panic Attacks Your caregiver has informed you that you are having an anxiety or panic attack. There may be many forms of this. Most of the time these attacks come suddenly and without warning. They come at any time of day, including periods of sleep, and at any time of life. They may be strong and unexplained. Although panic attacks are very scary, they are physically harmless. Sometimes the cause of your anxiety is not known. Anxiety is a protective mechanism of the body in its fight or flight mechanism. Most of these perceived danger situations are actually nonphysical situations (such as anxiety over losing a job). CAUSES  The causes of an anxiety or panic attack are many. Panic attacks may occur in otherwise healthy people given a certain set of circumstances. There may be a genetic cause for panic attacks. Some medications may also have anxiety as a side effect. SYMPTOMS  Some of the most common feelings are:  Intense terror.  Dizziness, feeling faint.   Hot and cold flashes.   Fear of going crazy.   Feelings that nothing is real.   Sweating.   Shaking.   Chest pain or a fast heartbeat (palpitations).   Smothering, choking sensations.   Feelings of impending doom and that death is near.   Tingling of extremities, this may be from over-breathing.   Altered reality (derealization).   Being detached from yourself (depersonalization).  Several symptoms can be  present to make up anxiety or panic attacks. DIAGNOSIS  The evaluation by your caregiver will depend on the type of symptoms you are experiencing. The diagnosis of anxiety or panic attack is made when no physical illness can be determined to be a cause of the symptoms. TREATMENT  Treatment to prevent anxiety and panic attacks may include:  Avoidance of circumstances that cause anxiety.   Reassurance and relaxation.   Regular exercise.   Relaxation therapies, such as yoga.   Psychotherapy with a psychiatrist or therapist.   Avoidance of caffeine, alcohol and illegal drugs.   Prescribed medication.  SEEK IMMEDIATE MEDICAL CARE IF:   You experience panic attack symptoms that are different than your usual symptoms.   You have any worsening or concerning symptoms.  Document Released: 10/13/2005 Document Revised: 10/02/2011 Document Reviewed: 02/14/2010 [facility] Patient Information 2012 [place], [place].

## 2012-04-07 NOTE — ED Notes (Signed)
Pt c/o dizziness and lightheadedness starting today; pt sts she was here recently and told she could be dehydrated; pt sts eating and drinking today

## 2012-04-07 NOTE — ED Notes (Signed)
Pt. Called in main waiting room for vital signs update w/ no answer x's 2. 

## 2012-04-07 NOTE — ED Provider Notes (Signed)
History     CSN: [id number]  Arrival date & time 04/07/12  1655   First MD Initiated Contact with Patient 04/07/12 2159      Chief Complaint  Patient presents with  . Dizziness    (Consider location/radiation/quality/duration/timing/severity/associated sxs/prior treatment) The history is provided by the patient.   Patient states that she's been having episodes of dizziness and lightheadedness. He states he is feeling she is going to pass out. She states she's had maggots in her saying she is a she's worried that he got onto her tooth brush. She states the lightheadedness comes on never wants to. He does not when she stands. She is also worried it could be related to dehydration or maggots. No chest pain. She states she's been eating and drinking well. She's been seen for some dizziness previously but would not let him test be done except for urinary test she does have a history of depressive disorder with psychotic features. Past Medical History  Diagnosis Date  . Anxiety   . Major depressive disorder, recurrent, severe with psychotic features     History reviewed. No pertinent past surgical history.  History reviewed. No pertinent family history.  History  Substance Use Topics  . Smoking status: Never Smoker   . Smokeless tobacco: Not on file  . Alcohol Use: No    OB History    Grav Para Term Preterm Abortions TAB SAB Ect Mult Living                  Review of Systems  Constitutional: Negative for fever, activity change, appetite change and fatigue.  HENT: Negative for neck stiffness.   Eyes: Negative for pain.  Respiratory: Negative for chest tightness and shortness of breath.   Cardiovascular: Negative for chest pain and leg swelling.  Gastrointestinal: Negative for nausea, vomiting, abdominal pain and diarrhea.  Genitourinary: Negative for flank pain.  Musculoskeletal: Negative for back pain.  Skin: Negative for rash.  Neurological: Positive for  light-headedness. Negative for weakness, numbness and headaches.  Psychiatric/Behavioral: Negative for hallucinations and behavioral problems.    Allergies  Review of patient's allergies indicates no known allergies.  Home Medications   Current Outpatient Rx  Name Route Sig Dispense Refill  . ALPRAZOLAM 0.5 MG PO TABS Oral Take 2 tablets (1 mg total) by mouth 3 (three) times daily as needed for anxiety. 15 tablet 0    BP 108/81  Pulse 64  Temp 97.9 F (36.6 C) (Oral)  Resp 15  SpO2 100%  Physical Exam  Nursing note and vitals reviewed. Constitutional: She is oriented to person, place, and time. She appears well-developed and well-nourished.  HENT:  Head: Normocephalic and atraumatic.  Eyes: EOM are normal. Pupils are equal, round, and reactive to light.  Neck: Normal range of motion. Neck supple.  Cardiovascular: Normal rate, regular rhythm and normal heart sounds.   No murmur heard. Pulmonary/Chest: Effort normal and breath sounds normal. No respiratory distress. She has no wheezes. She has no rales.  Abdominal: Soft. Bowel sounds are normal. She exhibits no distension. There is no tenderness. There is no rebound and no guarding.  Musculoskeletal: Normal range of motion.  Neurological: She is alert and oriented to person, place, and time. No cranial nerve deficit.  Skin: Skin is warm and dry.  Psychiatric: Her speech is normal.       Patient seems somewhat fixated on maggots. She is awake and otherwise appropriate    ED Course  Procedures (including critical care  time)  Labs Reviewed  GLUCOSE, CAPILLARY - Abnormal; Notable for the following:    Glucose-Capillary 142 (*)     All other components within normal limits  CBC - Abnormal; Notable for the following:    Hemoglobin 11.6 (*)     MCH 25.8 (*)     All other components within normal limits  BASIC METABOLIC PANEL - Abnormal; Notable for the following:    Glucose, Bld 108 (*)     GFR calc non Af Amer 71 (*)      GFR calc Af Amer 82 (*)     All other components within normal limits  URINALYSIS, ROUTINE W REFLEX MICROSCOPIC - Abnormal; Notable for the following:    APPearance CLOUDY (*)     All other components within normal limits  DIFFERENTIAL  PREGNANCY, URINE   No results found.   1. Lightheadedness   2. Anxiety     Date: 04/07/2012  Rate: 56  Rhythm: sinus bradycardia  QRS Axis: normal  Intervals: normal  ST/T Wave abnormalities: normal  Conduction Disutrbances:none  Narrative Interpretation: mild bradycardia  Old EKG Reviewed: changes noted     MDM  Patient comes in for lightheadedness. She's worried about dehydration and maggots. Lab works reassuring. She has a mild bradycardia on EKG that I doubt is the cause of her dizziness. She had an episode while she was here and she was not bradycardic during a period with her focusing on maggots and per the nurses been mentioning her soul, I wonder if there is a slight component to this. Patient states she's been told she needs to followup with her counselor. She states she will. She does not want further treatment or evaluation for that here. She's not appear to be a risk to herself at this time.        Juliet Rude. Rubin Payor, MD 04/07/12 [phone]

## 2012-04-07 NOTE — ED Notes (Signed)
  CBG 142  

## 2012-04-10 ENCOUNTER — Emergency Department (HOSPITAL_COMMUNITY)
Admission: EM | Admit: 2012-04-10 | Discharge: 2012-04-15 | Disposition: A | Discharge status: Other Acute Inpatient Hospital | Payer: Self-pay | Attending: Emergency Medicine | Admitting: Emergency Medicine

## 2012-04-10 ENCOUNTER — Encounter (HOSPITAL_COMMUNITY): Payer: Self-pay | Admitting: *Deleted

## 2012-04-10 DIAGNOSIS — R5381 Other malaise: Secondary | ICD-10-CM | POA: Insufficient documentation

## 2012-04-10 DIAGNOSIS — F22 Delusional disorders: Secondary | ICD-10-CM | POA: Insufficient documentation

## 2012-04-10 DIAGNOSIS — F411 Generalized anxiety disorder: Secondary | ICD-10-CM | POA: Insufficient documentation

## 2012-04-10 DIAGNOSIS — F29 Unspecified psychosis not due to a substance or known physiological condition: Secondary | ICD-10-CM | POA: Insufficient documentation

## 2012-04-10 DIAGNOSIS — F332 Major depressive disorder, recurrent severe without psychotic features: Secondary | ICD-10-CM | POA: Insufficient documentation

## 2012-04-10 LAB — CBC
HCT: 34.4 % — ABNORMAL LOW (ref 36.0–46.0)
Hemoglobin: 11.1 g/dL — ABNORMAL LOW (ref 12.0–15.0)
MCH: 25.8 pg — ABNORMAL LOW (ref 26.0–34.0)
RBC: 4.3 MIL/uL (ref 3.87–5.11)

## 2012-04-10 LAB — COMPREHENSIVE METABOLIC PANEL
ALT: 19 U/L (ref 0–35)
Alkaline Phosphatase: 119 U/L — ABNORMAL HIGH (ref 39–117)
BUN: 20 mg/dL (ref 6–23)
CO2: 22 mEq/L (ref 19–32)
Calcium: 9.3 mg/dL (ref 8.4–10.5)
GFR calc Af Amer: 75 mL/min — ABNORMAL LOW (ref >90)
GFR calc non Af Amer: 64 mL/min — ABNORMAL LOW (ref >90)
Glucose, Bld: 161 mg/dL — ABNORMAL HIGH (ref 70–99)
Potassium: 3.8 mEq/L (ref 3.5–5.1)
Sodium: 139 mEq/L (ref 135–145)

## 2012-04-10 LAB — ETHANOL: Alcohol, Ethyl (B): <11 mg/dL (ref 0–11)

## 2012-04-10 MED ORDER — ACETAMINOPHEN 325 MG PO TABS: 650.0000 mg | ORAL_TABLET | Freq: Once | ORAL | Status: DC

## 2012-04-10 NOTE — ED Notes (Signed)
Patient states that she just off the bus and wants to check in for [facility].  Patient is depressed and at her wits end.  Patient states, "someone is video taping me against my will.  I have called the police and the FBI but they are doing anything about it.  People are demanding me to do things to things with them, feels dehydrated and feels like to pass out."  Patient is tired of dealing with people.  Patient states that people are keeping her from admission to school.  Patient feels frustrated and is physically ill and overwhelmed and having homicidal ideations.

## 2012-04-10 NOTE — ED Notes (Signed)
Pt came to RN first, and was asked if wanted to be seen by MD; pt stated that she did not know and that she has been here recently and received fluid; pt would not clearly state why she came to hospital, but pt stated that she wanted to sit in waiting room to see if she felt better or if decided to be seen; spoke with pt that would be fine and that I was sitting at desk if pt needed anything; pt went to vending machine and sat in waiting room for about 10 minutes and then came to desk requesting to be seen; pt registered and sent to triage

## 2012-04-11 LAB — POCT PREGNANCY, URINE: Preg Test, Ur: NEGATIVE

## 2012-04-11 LAB — TSH: TSH: 1.798 u[IU]/mL (ref 0.350–4.500)

## 2012-04-11 LAB — RAPID URINE DRUG SCREEN, HOSP PERFORMED: Barbiturates: NOT DETECTED

## 2012-04-11 MED ORDER — LORAZEPAM 1 MG PO TABS
1.0000 mg | ORAL_TABLET | Freq: Three times a day (TID) | ORAL | Status: DC | PRN
  Administered 2012-04-11 – 2012-04-13 (×7): 1 mg via ORAL
  Filled 2012-04-11 (×8): qty 1

## 2012-04-11 MED ORDER — IBUPROFEN 200 MG PO TABS: 600.0000 mg | ORAL_TABLET | Freq: Three times a day (TID) | ORAL | Status: DC | PRN

## 2012-04-11 MED ORDER — ACETAMINOPHEN 325 MG PO TABS: 650.0000 mg | ORAL_TABLET | ORAL | Status: DC | PRN

## 2012-04-11 MED ORDER — ALUM & MAG HYDROXIDE-SIMETH 200-200-20 MG/5ML PO SUSP
30.0000 mL | ORAL | Status: DC | PRN
Start: 2012-04-11 — End: 2012-04-15

## 2012-04-11 MED ORDER — ONDANSETRON HCL 8 MG PO TABS
4.0000 mg | ORAL_TABLET | Freq: Three times a day (TID) | ORAL | Status: DC | PRN
  Administered 2012-04-15: 4 mg via ORAL
  Filled 2012-04-11: qty 1

## 2012-04-11 MED ORDER — ZOLPIDEM TARTRATE 5 MG PO TABS
5.0000 mg | ORAL_TABLET | Freq: Every evening | ORAL | Status: DC | PRN
  Administered 2012-04-13 (×2): 5 mg via ORAL
  Filled 2012-04-11 (×2): qty 1

## 2012-04-11 NOTE — ED Notes (Signed)
Sprite brought to pt 

## 2012-04-11 NOTE — ED Notes (Signed)
Act Team informed of pt request for re-evaluation.

## 2012-04-11 NOTE — [facility] Counselor (Signed)
Patient has been declined at [facility] by Verne Spurr PA due to patient acuity and acuity of the unit.

## 2012-04-11 NOTE — ED Provider Notes (Signed)
History     CSN: [id number]  Arrival date & time 04/10/12  2243   First MD Initiated Contact with Patient 04/10/12 2306      Chief Complaint  Patient presents with  . Depression    (Consider location/radiation/quality/duration/timing/severity/associated sxs/prior treatment) HPI 46 year old female presents to emergency department complaining of generalized weakness, fatigue, and "stress". Patient reports for the last 5 years, several packs friends from her church have been monitoring her the video and nausea as surveillance from a secret device. She reports these people have conference calls remotely about her. Patient has been to the police and is also a pock with these people, who deny that they are watching her. Patient reports she's had thoughts of hurting or killing these people, and is specifically named Lennart Pall as one of these people. Prior records reviewed, patient with admission in the past with similar features. Patient has had several visits to the emergency department over the last month with vague complaints. She recently was prescribed Xanax for anxiety. She reports this is not helping her symptoms.   Past Medical History  Diagnosis Date  . Anxiety   . Major depressive disorder, recurrent, severe with psychotic features     History reviewed. No pertinent past surgical history.  History reviewed. No pertinent family history.  History  Substance Use Topics  . Smoking status: Never Smoker   . Smokeless tobacco: Not on file  . Alcohol Use: No    OB History    Grav Para Term Preterm Abortions TAB SAB Ect Mult Living                  Review of Systems  Unable to perform ROS: Psychiatric disorder    Allergies  Review of patient's allergies indicates no known allergies.  Home Medications   Current Outpatient Rx  Name Route Sig Dispense Refill  . ALPRAZOLAM 0.5 MG PO TABS Oral Take 2 tablets (1 mg total) by mouth 3 (three) times daily as needed for anxiety.  15 tablet 0    BP 123/83  Pulse 77  Temp 98.2 F (36.8 C) (Oral)  Resp 16  SpO2 99%  Physical Exam  Nursing note and vitals reviewed. Constitutional: She is oriented to person, place, and time. She appears well-developed and well-nourished. She appears distressed (Anxious, paranoid).  HENT:  Head: Normocephalic and atraumatic.  Nose: Nose normal.  Mouth/Throat: Oropharynx is clear and moist.  Eyes: Conjunctivae and EOM are normal. Pupils are equal, round, and reactive to light.  Neck: Normal range of motion. Neck supple. No JVD present. No tracheal deviation present. No thyromegaly present.  Cardiovascular: Normal rate, regular rhythm, normal heart sounds and intact distal pulses.  Exam reveals no gallop and no friction rub.   No murmur heard. Pulmonary/Chest: Effort normal and breath sounds normal. No stridor. No respiratory distress. She has no wheezes. She has no rales. She exhibits no tenderness.  Abdominal: Soft. Bowel sounds are normal. She exhibits no distension and no mass. There is no tenderness. There is no rebound and no guarding.  Musculoskeletal: Normal range of motion. She exhibits no edema and no tenderness.  Lymphadenopathy:    She has no cervical adenopathy.  Neurological: She is oriented to person, place, and time. She exhibits normal muscle tone. Coordination normal.  Skin: Skin is dry. No rash noted. No erythema. No pallor.  Psychiatric:       Patient with delusions, paranoia. Flat affect. Patient without insight    ED Course  Procedures (  including critical care time)  Labs Reviewed  CBC - Abnormal; Notable for the following:    Hemoglobin 11.1 (*)     HCT 34.4 (*)     MCH 25.8 (*)     All other components within normal limits  COMPREHENSIVE METABOLIC PANEL - Abnormal; Notable for the following:    Glucose, Bld 161 (*)     Alkaline Phosphatase 119 (*)     Total Bilirubin 0.2 (*)     GFR calc non Af Amer 64 (*)     GFR calc Af Amer 75 (*)     All  other components within normal limits  ETHANOL  URINE RAPID DRUG SCREEN (HOSP PERFORMED)  TSH   No results found.   1. Delusion of persecution   2. Psychosis       MDM  Patient with psychosis and delusions. Since she has name someone that she is wanting to hurt, I do not feel she is safe to be discharged. I have instituted IVC paperwork.  Will need placement.        Olivia Mackie, MD 04/11/12 [phone]

## 2012-04-11 NOTE — BH Assessment (Signed)
Assessment Note   [name] is an 46 y.o. female presenting with HI and persecutory delusions.  Pt experiencing thought blocking and mutism and refused to cooperate with the assessor.  Per nurse:  Patient states that she is "just off the bus and wants to check in for [facility]". Patient is depressed and at her wits end. Patient states, "someone is video taping me against my will. I have called the police and the FBI but they are doing anything about it. People are demanding me to do things to things with them, feels dehydrated and feels like to pass out." Patient is tired of dealing with people. Patient states that people are keeping her from admission to school. Patient feels frustrated and is physically ill and overwhelmed and having homicidal ideations.  Pt denied SI and stated "I think Im ok now". Pt dies AV hallucinations. Pt is still highly delusional and fervently believes his is being monitored via video surveillance and that she is low jacked so that her every move is monitored.  Pt is homicidal toward a fellow church member named "Selena Batten" whom the pt believes is the person responsible for her constant surveillance.  Pt placed on IVC.  Pt referred to [facility] of adult inpt care.     REASSESSMENT 04/11/12: PT EXPRESSED THAT SHE FELT MUCH BETTER & WAS NOT SURE IS BEING HER OR GOING HOME TO DEAL WITH HER ISSUES WERE BETTER. PT EXPRESSED THAT SHE HAD SOME SO-CALLED FRIENDS HAD BEING FOLLOWING HER AROUND WITH A CAMERA TO CONFRONT ABOUT ISSUES IN THE PAST. PT CLAIMS THAT AFTER SHE WAS GIVEN SOME MEDS IN THE ED THAT SHE IS NOT BLUE ANY MORE. PT DENIES ANY IDEATION & HALLUCINATIONS. PT EXPRESSED THAT SHE FELT THAT IF SHE COULD BE DISCHARGED TO GO & FOLLOW UP WITH PROVIDER THAT EVERYTHING WILL BE OK. CLINICIAN EXPLAINED TO PT THAT SHE WOULD PAST THE INFORMATION TO EDP & RECOMMEND A TELEPSYCH TO DECIDE ON IF IT WAS OK FOR PT TO FOLLOW UP WITH PROVIDER. PT WAS CALM & COOPERATIVE WITH REASSESSMENT. EDP & NURSE WAS  NOTIFIED OF RECOMMENDATION  OF [facility] WHICH THEY HAVE AGREED TO FOLLOW UP WITH. [facility] HAS BEEN DECLINED BY NEIL MARSHBURN, PA AT [facility]. PT HAS BEEN REFERRED TO [facility]; PENDING DISPOSITION.           Axis I: Chronic Paranoid Schizophrenia Axis II: Deferred Axis III:  Past Medical History  Diagnosis Date  . Anxiety   . Major depressive disorder, recurrent, severe with psychotic features    Axis IV: other psychosocial or environmental problems, problems related to social environment and problems with access to health care services Axis V: 11-20 some danger of hurting self or others possible OR occasionally fails to maintain minimal personal hygiene OR gross impairment in communication  Past Medical History:  Past Medical History  Diagnosis Date  . Anxiety   . Major depressive disorder, recurrent, severe with psychotic features     History reviewed. No pertinent past surgical history.  Family History: History reviewed. No pertinent family history.  Social History:  reports that she has never smoked. She does not have any smokeless tobacco history on file. She reports that she does not drink alcohol or use illicit drugs.  Additional Social History:  Alcohol / Drug Use History of alcohol / drug use?: No history of alcohol / drug abuse  CIWA: CIWA-Ar BP: 123/83 mmHg Pulse Rate: 77  COWS:    Allergies: No Known Allergies  Home Medications:  (Not in a hospital admission)  OB/GYN Status:  No LMP recorded. Patient is postmenopausal.  General Assessment Data Location of Assessment: [facility] ED ACT Assessment: Yes Living Arrangements: Alone Can pt return to current living arrangement?: Yes Admission Status: Involuntary Is patient capable of signing voluntary admission?: Yes Transfer from: Home Referral Source: Self/Family/Friend     Risk to self Suicidal Ideation: No Suicidal Intent: No Is patient at risk for suicide?: No Suicidal Plan?: No Access  to Means: No What has been your use of drugs/alcohol within the last 12 months?: none Previous Attempts/Gestures: No How many times?: 0  Other Self Harm Risks: delusional Triggers for Past Attempts: None known Intentional Self Injurious Behavior: None Family Suicide History: Unable to assess Recent stressful life event(s): Other (Comment) (delusional) Persecutory voices/beliefs?: No Depression: Yes Depression Symptoms: Despondent;Tearfulness;Insomnia;Fatigue;Isolating;Loss of interest in usual pleasures;Feeling worthless/self pity Substance abuse history and/or treatment for substance abuse?: No Suicide prevention information given to non-admitted patients: Not applicable  Risk to Others Homicidal Ideation: Yes-Currently Present Thoughts of Harm to Others: Yes-Currently Present Comment - Thoughts of Harm to Others: wants to harm "Kim" whom pt believes is monitoring her Current Homicidal Intent: Yes-Currently Present Current Homicidal Plan: Yes-Currently Present Describe Current Homicidal Plan: vague plan to kill "Kim" Access to Homicidal Means: No Identified Victim: Kim History of harm to others?: No Assessment of Violence: None Noted Violent Behavior Description: none noted Does patient have access to weapons?: No Criminal Charges Pending?: No Does patient have a court date: No  Psychosis Hallucinations: None noted Delusions: Persecutory;Unspecified  Mental Status Report Appear/Hygiene: Disheveled Eye Contact: Poor Motor Activity: Unremarkable Speech: Elective mutism Level of Consciousness: Alert Mood: Anxious;Preoccupied Affect: Anxious;Preoccupied Anxiety Level: Moderate Thought Processes: Circumstantial;Tangential Judgement: Impaired Orientation: Person Obsessive Compulsive Thoughts/Behaviors: Severe  Cognitive Functioning Concentration: Decreased Memory: Recent Intact;Remote Intact IQ: Average Insight: Poor Impulse Control: Poor Appetite: Fair Weight Loss:  0  Weight Gain: 0  Sleep: Decreased Total Hours of Sleep: 4  Vegetative Symptoms: None  ADLScreening [facility] Assessment Services) Patient's cognitive ability adequate to safely complete daily activities?: Yes Patient able to express need for assistance with ADLs?: Yes Independently performs ADLs?: Yes  Abuse/Neglect [facility]) Physical Abuse: Denies Verbal Abuse: Denies Sexual Abuse: Denies  Prior Inpatient Therapy Prior Inpatient Therapy: Yes Prior Therapy Dates: 1/12 Prior Therapy Facilty/Provider(s): [facility] Reason for Treatment: delusions  Prior Outpatient Therapy Prior Outpatient Therapy: No Prior Therapy Dates: none Prior Therapy Facilty/Provider(s): none Reason for Treatment: none  ADL Screening (condition at time of admission) Patient's cognitive ability adequate to safely complete daily activities?: Yes Patient able to express need for assistance with ADLs?: Yes Independently performs ADLs?: Yes       Abuse/Neglect Assessment (Assessment to be complete while patient is alone) Physical Abuse: Denies Verbal Abuse: Denies Sexual Abuse: Denies Exploitation of patient/patient's resources: Denies Self-Neglect: Denies Values / Beliefs Cultural Requests During Hospitalization: None Spiritual Requests During Hospitalization: None        Additional Information 1:1 In Past 12 Months?: No CIRT Risk: Yes Elopement Risk: Yes Does patient have medical clearance?: Yes     Disposition:  Disposition Disposition of Patient: Inpatient treatment program Type of inpatient treatment program: Adult  On Site Evaluation by:   Reviewed with Physician:     Waldron Session 04/11/2012 3:17 PM

## 2012-04-11 NOTE — ED Notes (Signed)
Pt crying and tearful and states "i am ready to go". Pt informed of process with verbal understanding and will continue to monitor pt, sitter remains at bedside.

## 2012-04-11 NOTE — ED Notes (Signed)
Pt resting quietly with sitter at bedside, pt denies any pain or complaints. Pt states "i think i am ready to go home now because the rest and medications has helped me. I don't think that i want to be locked in there, if they send me to [facility] health then i will go but not any of the others. I can go home and do home health outpatient. Tell them I want to be re-evaluated to go home". In formed pt Act team will be notified but uncertain of time frame of pt re-evaluation with verbal understanding, will continue to monitor pt.

## 2012-04-11 NOTE — ED Notes (Signed)
Asked pt if she wanted to take a shower this morning she stated "no"

## 2012-04-11 NOTE — ED Notes (Signed)
Pt states that she believes there is an audiovideo surveillance device following her everywhere she goes. Pt states that she believes "kim perry" initiated the device and her friends from church are also in on it. States she believes the device is capable of following her everywhere she goes and is "like a Naval architect and she can hear them and knows what the device looks like". Pt states earlier today she felt homicidal towards the people that were bothering her, but states that she has no plan and "just wants to get rid of them".  Pt initially refused to change into paper scrubs, but complied following therapeutic communication by RN and MD. Pt unable to provide urine specimen at this time. Cup at bedside. Pt states she will advise when she is able to give specimen

## 2012-04-11 NOTE — ED Notes (Signed)
Per ACT team, pt refused to speak with them. Began getting hostile at which point the stopped the interview. ACT team to talk to dr Norlene Campbell regarding plan of care for pt

## 2012-04-11 NOTE — ED Notes (Signed)
Pt served IVC paperwork by [employer]. Pt calm and resting

## 2012-04-11 NOTE — ED Provider Notes (Signed)
BP 129/85  Pulse 70  Temp 97.7 F (36.5 C) (Oral)  Resp 18  SpO2 96%  Patient has been in ED for 25h and . Patient states she would like to go home and that's she's "feeling better". Telepsych ordered and pending.   Forbes Cellar, MD 04/11/12 [id number]

## 2012-04-11 NOTE — ED Notes (Signed)
Pt was lying on floor and calling to Jesus. Pt refused to get off floor per sitter. Therapeutic communication initiated and pt layed in bed. Pt states "i was just praying." pt resting at this time"

## 2012-04-11 NOTE — BH Assessment (Signed)
Assessment Note   [name] is an 46 y.o. female presenting with HI and persecutory delusions.  Pt experiencing thought blocking and mutism and refused to cooperate with the assessor.  Per nurse:  Patient states that she is "just off the bus and wants to check in for [facility]". Patient is depressed and at her wits end. Patient states, "someone is video taping me against my will. I have called the police and the FBI but they are doing anything about it. People are demanding me to do things to things with them, feels dehydrated and feels like to pass out." Patient is tired of dealing with people. Patient states that people are keeping her from admission to school. Patient feels frustrated and is physically ill and overwhelmed and having homicidal ideations.  Pt denied SI and stated "I think Im ok now". Pt dies AV hallucinations. Pt is still highly delusional and fervently believes his is being monitored via video surveillance and that she is low jacked so that her every move is monitored.  Pt is homicidal toward a fellow church member named "Selena Batten" whom the pt believes is the person responsible for her constant surveillance.  Pt placed on IVC.  Pt referred to [facility] of adult inpt care.          Axis I: Chronic Paranoid Schizophrenia Axis II: Deferred Axis III:  Past Medical History  Diagnosis Date  . Anxiety   . Major depressive disorder, recurrent, severe with psychotic features    Axis IV: other psychosocial or environmental problems, problems related to social environment and problems with access to health care services Axis V: 11-20 some danger of hurting self or others possible OR occasionally fails to maintain minimal personal hygiene OR gross impairment in communication  Past Medical History:  Past Medical History  Diagnosis Date  . Anxiety   . Major depressive disorder, recurrent, severe with psychotic features     History reviewed. No pertinent past surgical history.  Family History:  History reviewed. No pertinent family history.  Social History:  reports that she has never smoked. She does not have any smokeless tobacco history on file. She reports that she does not drink alcohol or use illicit drugs.  Additional Social History:  Alcohol / Drug Use History of alcohol / drug use?: No history of alcohol / drug abuse  CIWA: CIWA-Ar BP: 123/83 mmHg Pulse Rate: 77  COWS:    Allergies: No Known Allergies  Home Medications:  (Not in a hospital admission)  OB/GYN Status:  No LMP recorded. Patient is postmenopausal.  General Assessment Data Location of Assessment: [facility] ED ACT Assessment: Yes Living Arrangements: Alone Can pt return to current living arrangement?: Yes Admission Status: Involuntary Is patient capable of signing voluntary admission?: Yes Transfer from: Home Referral Source: Self/Family/Friend     Risk to self Suicidal Ideation: No Suicidal Intent: No Is patient at risk for suicide?: No Suicidal Plan?: No Access to Means: No What has been your use of drugs/alcohol within the last 12 months?: none Previous Attempts/Gestures: No How many times?: 0  Other Self Harm Risks: delusional Triggers for Past Attempts: None known Intentional Self Injurious Behavior: None Family Suicide History: Unable to assess Recent stressful life event(s): Other (Comment) (delusional) Persecutory voices/beliefs?: No Depression: Yes Depression Symptoms: Despondent;Tearfulness;Insomnia;Fatigue;Isolating;Loss of interest in usual pleasures;Feeling worthless/self pity Substance abuse history and/or treatment for substance abuse?: No Suicide prevention information given to non-admitted patients: Not applicable  Risk to Others Homicidal Ideation: Yes-Currently Present Thoughts of Harm to  Others: Yes-Currently Present Comment - Thoughts of Harm to Others: wants to harm "Kim" whom pt believes is monitoring her Current Homicidal Intent: Yes-Currently Present Current  Homicidal Plan: Yes-Currently Present Describe Current Homicidal Plan: vague plan to kill "Kim" Access to Homicidal Means: No Identified Victim: Kim History of harm to others?: No Assessment of Violence: None Noted Violent Behavior Description: none noted Does patient have access to weapons?: No Criminal Charges Pending?: No Does patient have a court date: No  Psychosis Hallucinations: None noted Delusions: Persecutory;Unspecified  Mental Status Report Appear/Hygiene: Disheveled Eye Contact: Poor Motor Activity: Unremarkable Speech: Elective mutism Level of Consciousness: Alert Mood: Anxious;Preoccupied Affect: Anxious;Preoccupied Anxiety Level: Moderate Thought Processes: Circumstantial;Tangential Judgement: Impaired Orientation: Person Obsessive Compulsive Thoughts/Behaviors: Severe  Cognitive Functioning Concentration: Decreased Memory: Recent Intact;Remote Intact IQ: Average Insight: Poor Impulse Control: Poor Appetite: Fair Weight Loss: 0  Weight Gain: 0  Sleep: Decreased Total Hours of Sleep: 4  Vegetative Symptoms: None  ADLScreening [facility] Assessment Services) Patient's cognitive ability adequate to safely complete daily activities?: Yes Patient able to express need for assistance with ADLs?: Yes Independently performs ADLs?: Yes  Abuse/Neglect [facility]) Physical Abuse: Denies Verbal Abuse: Denies Sexual Abuse: Denies  Prior Inpatient Therapy Prior Inpatient Therapy: Yes Prior Therapy Dates: 1/12 Prior Therapy Facilty/Provider(s): [facility] Reason for Treatment: delusions  Prior Outpatient Therapy Prior Outpatient Therapy: No Prior Therapy Dates: none Prior Therapy Facilty/Provider(s): none Reason for Treatment: none  ADL Screening (condition at time of admission) Patient's cognitive ability adequate to safely complete daily activities?: Yes Patient able to express need for assistance with ADLs?: Yes Independently performs ADLs?: Yes         Abuse/Neglect Assessment (Assessment to be complete while patient is alone) Physical Abuse: Denies Verbal Abuse: Denies Sexual Abuse: Denies Exploitation of patient/patient's resources: Denies Self-Neglect: Denies Values / Beliefs Cultural Requests During Hospitalization: None Spiritual Requests During Hospitalization: None        Additional Information 1:1 In Past 12 Months?: No CIRT Risk: Yes Elopement Risk: Yes Does patient have medical clearance?: Yes     Disposition:  Disposition Disposition of Patient: Inpatient treatment program Type of inpatient treatment program: Adult  On Site Evaluation by:   Reviewed with Physician:     Ena Dawley Pate 04/11/2012 12:21 AM

## 2012-04-11 NOTE — ED Notes (Signed)
Pt reports she was hungry, pt informed of food choices and refused food, " i wanted a fruit salad", pt informed no fruit salads even in cafeteria, pt continues to deny any other food choices.

## 2012-04-11 NOTE — ED Notes (Signed)
Pt asked for a bag of chips. Went and got pt advocate and she is going down to the cafe to get some for her.

## 2012-04-12 NOTE — ED Notes (Signed)
Additional meal tray ordered for patient.  Patient encouraged to eat meal tray as soon as it arrives to avoid reoccurrence of gnats near food.

## 2012-04-12 NOTE — ED Notes (Signed)
Patient requesting nachos.  Patient informed that the kitchen is closed.  Patient requesting to speak with ACT team.  ACT team, Berna Spare, informed of patient's request.  Berna Spare from ACT team to see the patient.  Patient informed that request has been made and that she will be seen by ACT team.

## 2012-04-12 NOTE — ED Notes (Signed)
Pt is doing another tele psych per psychiatrist request for further questioning, sitter remains at bedside and will continue to monitor pt.

## 2012-04-12 NOTE — ED Notes (Signed)
Pt up to shower w/ security and sitter

## 2012-04-12 NOTE — ED Notes (Signed)
After asking for fruit tray pt refused

## 2012-04-12 NOTE — ED Notes (Signed)
Patient continuously pacing in the room and in the halls.  Patient asked several times to return to her room.  Patient able to be redirected.  Patient requesting "fresh fruit".  Patient informed that we do not have fresh fruit at this time.  Apple sauce offered.  Patient took the applesauce and beverage.  Patient continues to pace in her room.  Patient request, "something to calm my nerves".

## 2012-04-12 NOTE — ED Notes (Signed)
Per sitter pt is talking to herself, asked for blanket which was given then she threw on floor she washed her hands many times at sink  Before eating, requested nachos and then states that she will eat the lunch in front of her

## 2012-04-12 NOTE — ED Notes (Signed)
Report received from [place], [place].  Assumed care of patient at this time.  Patient attendant at bedside with patient.

## 2012-04-12 NOTE — ED Notes (Signed)
Tele conference going on now

## 2012-04-12 NOTE — ED Notes (Signed)
Pt doing tele psych for pt re-evaluation.

## 2012-04-12 NOTE — BH Assessment (Signed)
Assessment Note   [name] is an 46 y.o. female.  Patient was seen for re-assessment today by this clinician.  Pt still appears disheveled but makes good eye contact.  Pt starts out calmly talking but very rapidly gets excited talking about other people causing her problems.  She talks about ex-boyfriend's family bothering her stating "I hate those people."  She goes on to say "I wish they were dead."  Patient also talks about wanting to move to [place] to "make a new start."  She does not know anyone there but thinks that it will solve her problems.  She goes on to say that she has been off medications for a long time and it has been a year since she saw her psychiatrist with [facility] the [place].  She did calm down and thanked clinician for talking to her.  Placement was tried at [facility] but there were no beds available.  Telepsychiatry was done today (06/17) and they recommended inpatient psychiatric hospitalization.  REASSESSMENT 04/11/12:  PT EXPRESSED THAT SHE FELT MUCH BETTER & WAS NOT SURE IS BEING HER OR GOING HOME TO DEAL WITH HER ISSUES WERE BETTER. PT EXPRESSED THAT SHE HAD SOME SO-CALLED FRIENDS HAD BEING FOLLOWING HER AROUND WITH A CAMERA TO CONFRONT ABOUT ISSUES IN THE PAST. PT CLAIMS THAT AFTER SHE WAS GIVEN SOME MEDS IN THE ED THAT SHE IS NOT BLUE ANY MORE. PT DENIES ANY IDEATION & HALLUCINATIONS. PT EXPRESSED THAT SHE FELT THAT IF SHE COULD BE DISCHARGED TO GO & FOLLOW UP WITH PROVIDER THAT EVERYTHING WILL BE OK. CLINICIAN EXPLAINED TO PT THAT SHE WOULD PAST THE INFORMATION TO EDP & RECOMMEND A TELEPSYCH TO DECIDE ON IF IT WAS OK FOR PT TO FOLLOW UP WITH PROVIDER. PT WAS CALM & COOPERATIVE WITH REASSESSMENT. EDP & NURSE WAS NOTIFIED OF RECOMMENDATION OF [facility] WHICH THEY HAVE AGREED TO FOLLOW UP WITH. [facility] HAS BEEN DECLINED BY NEIL MARSHBURN, PA AT [facility]. PT HAS BEEN REFERRED TO [facility]; PENDING DISPOSITION.   Axis I: Chronic  Paranoid Schizophrenia Axis II: Deferred Axis III:  Past Medical History  Diagnosis Date  . Anxiety   . Major depressive disorder, recurrent, severe with psychotic features    Axis IV: economic problems, occupational problems, other psychosocial or environmental problems, problems related to social environment and problems with primary support group Axis V: 31-40 impairment in reality testing  Past Medical History:  Past Medical History  Diagnosis Date  . Anxiety   . Major depressive disorder, recurrent, severe with psychotic features     History reviewed. No pertinent past surgical history.  Family History: History reviewed. No pertinent family history.  Social History:  reports that she has never smoked. She does not have any smokeless tobacco history on file. She reports that she does not drink alcohol or use illicit drugs.  Additional Social History:  Alcohol / Drug Use History of alcohol / drug use?: No history of alcohol / drug abuse  CIWA: CIWA-Ar BP: 122/86 mmHg Pulse Rate: 68  COWS:    Allergies: No Known Allergies  Home Medications:  (Not in a hospital admission)  OB/GYN Status:  No LMP recorded. Patient is postmenopausal.  General Assessment Data Location of Assessment: [facility] ED ACT Assessment: Yes Living Arrangements: Alone Can pt return to current living arrangement?: Yes Admission Status: Involuntary Is patient capable of signing voluntary admission?: No (Pt on IVC) Transfer from: Acute Hospital Referral Source: Self/Family/Friend  Risk to self Suicidal Ideation: No Suicidal Intent: No Is patient at risk for suicide?: No Suicidal Plan?: No Access to Means: No What has been your use of drugs/alcohol within the last 12 months?: None Previous Attempts/Gestures: No How many times?: 0  Other Self Harm Risks: Delusional Triggers for Past Attempts: None known Intentional Self Injurious Behavior: None Family Suicide History: Unable to  assess Recent stressful life event(s): Conflict (Comment) (Reports conflict w/ ex-boyfriend's family) Persecutory voices/beliefs?: Yes Depression: Yes Depression Symptoms: Despondent;Isolating;Insomnia;Feeling worthless/self pity;Feeling angry/irritable Substance abuse history and/or treatment for substance abuse?: No Suicide prevention information given to non-admitted patients: Not applicable  Risk to Others Homicidal Ideation: Yes-Currently Present Thoughts of Harm to Others: Yes-Currently Present Comment - Thoughts of Harm to Others: Pt wants to see people dead that she conflicts with Current Homicidal Intent: Yes-Currently Present Current Homicidal Plan: Yes-Currently Present Describe Current Homicidal Plan: Had a vague plan Access to Homicidal Means: No Identified Victim: "Kim" History of harm to others?: No Assessment of Violence: None Noted Violent Behavior Description: None noted Does patient have access to weapons?: No Criminal Charges Pending?: No Does patient have a court date: No  Psychosis Hallucinations: None noted Delusions: Persecutory  Mental Status Report Appear/Hygiene: Disheveled Eye Contact: Good Motor Activity: Unremarkable Speech: Rapid;Pressured Level of Consciousness: Alert Mood: Anxious;Suspicious Affect: Anxious;Preoccupied Anxiety Level: Moderate Thought Processes: Circumstantial;Tangential Judgement: Impaired Orientation: Person;Place Obsessive Compulsive Thoughts/Behaviors: Severe  Cognitive Functioning Concentration: Decreased Memory: Recent Intact;Remote Intact IQ: Average Insight: Poor Impulse Control: Poor Appetite: Fair Weight Loss: 0  Weight Gain: 0  Sleep: Decreased Total Hours of Sleep: 4  Vegetative Symptoms: None  ADLScreening [facility] Assessment Services) Patient's cognitive ability adequate to safely complete daily activities?: Yes Patient able to express need for assistance with ADLs?: Yes Independently performs ADLs?:  Yes  Abuse/Neglect [facility]) Physical Abuse: Denies Verbal Abuse: Denies Sexual Abuse: Denies  Prior Inpatient Therapy Prior Inpatient Therapy: Yes Prior Therapy Dates: 1/12 Prior Therapy Facilty/Provider(s): [facility] Reason for Treatment: delusions  Prior Outpatient Therapy Prior Outpatient Therapy: Yes Prior Therapy Dates: 2012 Prior [facility] Facilty/Provider([facility]): [facility] [place] Reason for Treatment: chronic mental illness  ADL Screening (condition at time of admission) Patient's cognitive ability adequate to safely complete daily activities?: Yes Patient able to express need for assistance with ADLs?: Yes Independently performs ADLs?: Yes       Abuse/Neglect Assessment (Assessment to be complete while patient is alone) Physical Abuse: Denies Verbal Abuse: Denies Sexual Abuse: Denies Exploitation of patient/patient's resources: Denies Self-Neglect: Denies Values / Beliefs Cultural Requests During Hospitalization: None Spiritual Requests During Hospitalization: None        Additional Information 1:1 In Past 12 Months?: No CIRT Risk: Yes Elopement Risk: Yes Does patient have medical clearance?: Yes     Disposition:  Disposition Disposition of Patient: Inpatient treatment program;Referred to Type of inpatient treatment program: Adult Patient referred to:  ([facility] to [facility] but no beds)  On Site Evaluation by:   Reviewed with Physician:     Beatriz Stallion Ray 04/12/2012 11:45 PM

## 2012-04-12 NOTE — ED Notes (Signed)
Pt is tearful and crying at this time, pt asked why is she crying and pt states "im okay". Pt continues to cry and not answer and other questions, pt raising her hands as though she is praying. Sitter remains at bedside and will continue to monitor pt.

## 2012-04-12 NOTE — ED Provider Notes (Addendum)
Resting comfortably . Appears comfortable offers no complaint. Awaiting consultation from telepsych  Doug Sou, MD 04/12/12 1610 11:10 AM psychiatric tele-psychiatry consultation from yesterday reviewed recommended. Patient reports presently "I'm in a much better place today than yesterday". Patient is alert cheerful seems appropriate .She feels safe for discharge. Psychiatry reconsult at to recommend medications and disposition  Doug Sou, MD 04/12/12 1114  4:50 PM patient alert ambulatory Glasgow Coma Score 15 pleasant and cooperative. Stable for transfer to [facility].  Doug Sou, MD 04/15/12 [phone]

## 2012-04-12 NOTE — ED Notes (Signed)
Pt resting quietly at this time sitter at bedside

## 2012-04-12 NOTE — ED Notes (Signed)
Pt was sleeping good this am. Now has been up to br several times and wants a fruit tray for breakfast also states that she is ready to go home. Act team informed

## 2012-04-13 NOTE — ED Notes (Signed)
AIDET performed. Patient with sitter.

## 2012-04-13 NOTE — BH Assessment (Signed)
Assessment Note   [name] is an 46 y.o. female that was reassessed this day.  Pt is mumbling, praying loudly to God, is restless and believes she should go home to follow up with [facility] [place].  Pt had telepsych yesterday recommending inpatient treatment.  Pt is still stating that she wants others dead.  Pt denies SI.  Pt's speech is tangential and pt has to be redirected often.  However, pt is cooperative currently.  [facility] to [facility] and pt was declined by Alcario Drought at 1540 due to pt acuity.  Pt also declined at [facility] due to pt acuity.  [place], [facility], no beds available.  Left message for [place] and no return call.  Pt will need referral to [facility].  Completed reassessment, assessment notification and faxed to [facility] to log.  [facility] referral initiated.  Updated ED staff.  Previous Note:  [name] is an 46 y.o. female.  Patient was seen for re-assessment today by this clinician. Pt still appears disheveled but makes good eye contact. Pt starts out calmly talking but very rapidly gets excited talking about other people causing her problems. She talks about ex-boyfriend's family bothering her stating "I hate those people." She goes on to say "I wish they were dead." Patient also talks about wanting to move to [place] to "make a new start." She does not know anyone there but thinks that it will solve her problems. She goes on to say that she has been off medications for a long time and it has been a year since she saw her psychiatrist with [facility] the [place]. She did calm down and thanked clinician for talking to her. Placement was tried at [facility] but there were no beds available. Telepsychiatry was done today (06/17) and they recommended inpatient psychiatric hospitalization.   Axis I: Schizophrenia, Paranoid Type (Chronic) Axis II: Deferred Axis III:  Past Medical History  Diagnosis Date  . Anxiety   . Major depressive disorder, recurrent, severe  with psychotic features    Axis IV: economic problems, occupational problems, other psychosocial or environmental problems, problems related to social environment and problems with primary support group Axis V: 11-20 some danger of hurting self or others possible OR occasionally fails to maintain minimal personal hygiene OR gross impairment in communication  Past Medical History:  Past Medical History  Diagnosis Date  . Anxiety   . Major depressive disorder, recurrent, severe with psychotic features     History reviewed. No pertinent past surgical history.  Family History: History reviewed. No pertinent family history.  Social History:  reports that she has never smoked. She does not have any smokeless tobacco history on file. She reports that she does not drink alcohol or use illicit drugs.  Additional Social History:  Alcohol / Drug Use Pain Medications: na Prescriptions: see list Over the Counter: see list History of alcohol / drug use?: No history of alcohol / drug abuse Longest period of sobriety (when/how long): na Negative Consequences of Use:  (na) Withdrawal Symptoms:  (na)  CIWA: CIWA-Ar BP: 142/78 mmHg Pulse Rate: 78  COWS:    Allergies: No Known Allergies  Home Medications:  (Not in a hospital admission)  OB/GYN Status:  No LMP recorded. Patient is postmenopausal.  General Assessment Data Location of Assessment: [facility] ED ACT Assessment: Yes Living Arrangements: Alone Can pt return to current living arrangement?: Yes Admission Status: Involuntary Is patient capable of signing voluntary admission?: No (Pt is IVC)  Transfer from: Acute Hospital Referral Source: Self/Family/Friend  Education Status Is patient currently in school?: No  Risk to self Suicidal Ideation: No Suicidal Intent: No Is patient at risk for suicide?: No Suicidal Plan?: No Access to Means: No What has been your use of drugs/alcohol within the last 12 months?: none Previous  Attempts/Gestures: No How many times?: 0  Other Self Harm Risks: Delusional Triggers for Past Attempts: None known Intentional Self Injurious Behavior: None Family Suicide History: Unknown Recent stressful life event(s): Conflict (Comment) (reports conflict with ex-boyfriend's family) Persecutory voices/beliefs?: No Depression: Yes Depression Symptoms: Despondent;Isolating;Insomnia;Tearfulness;Feeling worthless/self pity;Feeling angry/irritable Substance abuse history and/or treatment for substance abuse?: No Suicide prevention information given to non-admitted patients: Not applicable  Risk to Others Homicidal Ideation: Yes-Currently Present Thoughts of Harm to Others: Yes-Currently Present Comment - Thoughts of Harm to Others: pt wants to see people dead that she has conflict with Current Homicidal Intent: Yes-Currently Present Current Homicidal Plan: Yes-Currently Present Describe Current Homicidal Plan: has a vague plan Access to Homicidal Means: No Identified Victim: "Kim" History of harm to others?: No Assessment of Violence: None Noted Violent Behavior Description: na - pt cooperative currently Does patient have access to weapons?: No Criminal Charges Pending?: No Does patient have a court date: No  Psychosis Hallucinations: None noted Delusions: Persecutory;Unspecified  Mental Status Report Appear/Hygiene: Disheveled Eye Contact: Fair Motor Activity: Restlessness Speech: Rapid;Pressured Level of Consciousness: Alert Mood: Depressed;Preoccupied Affect: Anxious;Preoccupied Anxiety Level: Moderate Thought Processes: Circumstantial;Tangential Judgement: Impaired Orientation: Person;Place Obsessive Compulsive Thoughts/Behaviors: Severe  Cognitive Functioning Concentration: Decreased Memory: Recent Intact;Remote Intact IQ: Average Insight: Poor Impulse Control: Poor Appetite: Fair Weight Loss: 0  Weight Gain: 0  Sleep: Decreased Total Hours of Sleep: 4    Vegetative Symptoms: None  ADLScreening [facility] Assessment Services) Patient's cognitive ability adequate to safely complete daily activities?: Yes Patient able to express need for assistance with ADLs?: Yes Independently performs ADLs?: Yes  Abuse/Neglect [facility]) Physical Abuse: Denies Verbal Abuse: Denies Sexual Abuse: Denies  Prior Inpatient Therapy Prior Inpatient Therapy: Yes Prior Therapy Dates: 1/12 Prior Therapy Facilty/Provider(s): [facility] Reason for Treatment: delusions  Prior Outpatient Therapy Prior Outpatient Therapy: Yes Prior Therapy Dates: 2012 Prior Therapy Facilty/Provider(s): [facility] [place] Reason for Treatment: chronic mental illness  ADL Screening (condition at time of admission) Patient's cognitive ability adequate to safely complete daily activities?: Yes Patient able to express need for assistance with ADLs?: Yes Independently performs ADLs?: Yes Weakness of Legs: None Weakness of Arms/Hands: None  Home Assistive Devices/Equipment Home Assistive Devices/Equipment: None    Abuse/Neglect Assessment (Assessment to be complete while patient is alone) Physical Abuse: Denies Verbal Abuse: Denies Sexual Abuse: Denies Exploitation of patient/patient's resources: Denies Self-Neglect: Denies Values / Beliefs Cultural Requests During Hospitalization: None Spiritual Requests During Hospitalization: None Consults Spiritual Care Consult Needed: No Social Work Consult Needed: No Merchant navy officer (For Healthcare) Advance Directive: Patient does not have advance directive Nutrition Screen Diet: Regular  Additional Information 1:1 In Past 12 Months?: No CIRT Risk: Yes Elopement Risk: Yes Does patient have medical clearance?: Yes     Disposition:  Disposition Disposition of Patient: Referred to;Inpatient treatment program Type of inpatient treatment program: Adult Patient referred to: [facility] ([facility]ending [facility])  On Site Evaluation by:    Reviewed with Physician:  Rancour   Caryl Comes 04/13/2012 6:26 PM

## 2012-04-13 NOTE — ED Notes (Signed)
ACT team has spoke with pt, pt is aware of poc. Pt states she is feeling better states she thinks she just needs to take medications daily and then she will be okay. Pt is aware she can not leave. Pt is on list for placement.

## 2012-04-13 NOTE — ED Notes (Signed)
Pt requesting for something to help her calm down, pt states she is starting to get "antsy and would like to go home." pt made aware of plan of care, will check orders.

## 2012-04-13 NOTE — ED Notes (Signed)
New meal tray ordered per pts request, note pt did eat 75% of meal, pt ordered a fruit tray

## 2012-04-13 NOTE — ED Notes (Signed)
Pt has been calm and cooperative all afternoon. Pt is now agitated and pretending to cry continuely stating "thank you lord." calming techniques used.

## 2012-04-14 NOTE — ED Notes (Signed)
Sitter remains at bedside.  No distress noted.  Pt resting, breathing pattern unlabored.

## 2012-04-14 NOTE — ED Notes (Signed)
pts behavior continues to escalate.  Sitter remains at bedside.  Pt raising her voice, continues to follow commands although has to be de-escalated.

## 2012-04-14 NOTE — ED Notes (Signed)
Per sitter, pt had multiple food items hidden in room including graham crackers, sandwiches and other snacks from meal bags hidden in the room which he consumed prior to going to the restroom.

## 2012-04-14 NOTE — ED Notes (Signed)
Pt alert, pleasant, and cooperative

## 2012-04-14 NOTE — BH Assessment (Signed)
Assessment Note   [name] is an 46 y.o. female.  [name] was seen this evening by this clinician.  Nurses notes indicate that patient was hiding food in her room.  She has been agitated (verbal) with staff but is able to be redirected.  With this clinician she was improved from when he saw her on 06/17.  Pt continues to deny SI and A/V hallucinations.  When asked if she wanted to kill someone she denies and said that she had said that she wanted to hurt this other woman.  When asked if she still felt this way she said no and that she can avoid being around this other person because she hardly ever sees this other female out in public.  She talked a lot about wanting to apply for a Ph.D program in education at [employer].  She is frustrated because the application is on-line for a January start date and the application has to be completed on-line before the end of June.  She said that if she does not get into this program she will move to [place] and start anew.  She also plans to start with [facility] the [place] upon discharge.  Clinician did tell her that the two previous telepsychiatry consults had recommended inpatient care.  This clinician completed the referral to [facility].  Currently awaiting placement on their wait list.  Previous note: [name] is an 46 y.o. female that was reassessed this day. Pt is mumbling, praying loudly to God, is restless and believes she should go home to follow up with [facility] the [place]. Pt had telepsych yesterday recommending inpatient treatment. Pt is still stating that she wants others dead. Pt denies SI. Pt's speech is tangential and pt has to be redirected often. However, pt is cooperative currently. Sent referral to [facility] and pt was declined by Alcario Drought at 1540 due to pt acuity. Pt also declined at [facility] due to pt acuity. [place], [facility], no beds available. Left message for [place] and no return call. Pt will need referral to [facility].  Completed reassessment, assessment notification and faxed to [facility] to log. [facility] referral initiated. Updated ED staff.   Previous Note: [name] is an 46 y.o. female.  Patient was seen for re-assessment today by this clinician. Pt still appears disheveled but makes good eye contact. Pt starts out calmly talking but very rapidly gets excited talking about other people causing her problems. She talks about ex-boyfriend's family bothering her stating "I hate those people." She goes on to say "I wish they were dead." Patient also talks about wanting to move to [place] to "make a new start." She does not know anyone there but thinks that it will solve her problems. She goes on to say that she has been off medications for a long time and it has been a year since she saw her psychiatrist with [employer] of the [place]. She did calm down and thanked clinician for talking to her. Placement was tried at [facility] but there were no beds available. Telepsychiatry was done today (06/17) and they recommended inpatient psychiatric hospitalization.  Axis I: Psychotic Disorder NOS Axis II: Deferred Axis III:  Past Medical History  Diagnosis Date  . Anxiety   . Major depressive disorder, recurrent, severe with psychotic features    Axis IV: economic problems and problems with primary support group Axis V: 31-40 impairment in reality testing  Past Medical History:  Past Medical History  Diagnosis Date  .  Anxiety   . Major depressive disorder, recurrent, severe with psychotic features     History reviewed. No pertinent past surgical history.  Family History: History reviewed. No pertinent family history.  Social History:  reports that she has never smoked. She does not have any smokeless tobacco history on file. She reports that she does not drink alcohol or use illicit drugs.  Additional Social History:  Alcohol / Drug Use Pain Medications: na Prescriptions: see list Over the  Counter: see list History of alcohol / drug use?: No history of alcohol / drug abuse Longest period of sobriety (when/how long): na Negative Consequences of Use:  (na) Withdrawal Symptoms:  (na)  CIWA: CIWA-Ar BP: 130/88 mmHg Pulse Rate: 90  COWS:    Allergies: No Known Allergies  Home Medications:  (Not in a hospital admission)  OB/GYN Status:  No LMP recorded. Patient is postmenopausal.  General Assessment Data Location of Assessment: [facility] ED ACT Assessment: Yes Living Arrangements: Alone Can pt return to current living arrangement?: Yes Admission Status: Involuntary Is patient capable of signing voluntary admission?: No (Pt on IVC) Transfer from: Acute Hospital Referral Source: Self/Family/Friend  Education Status Is patient currently in school?: No  Risk to self Suicidal Ideation: No Suicidal Intent: No Is patient at risk for suicide?: No Suicidal Plan?: No Access to Means: No What has been your use of drugs/alcohol within the last 12 months?: None Previous Attempts/Gestures: No How many times?: 0  Other Self Harm Risks: Delusional Triggers for Past Attempts: None known Intentional Self Injurious Behavior: None Family Suicide History: Unknown Recent stressful life event(s): Conflict (Comment) (Reports past conflict with boyfriend's family) Persecutory voices/beliefs?: No Depression: Yes Depression Symptoms: Loss of interest in usual pleasures;Feeling worthless/self pity Substance abuse history and/or treatment for substance abuse?: No Suicide prevention information given to non-admitted patients: Not applicable  Risk to Others Homicidal Ideation: No Thoughts of Harm to Others: No Comment - Thoughts of Harm to Others: Patient currently denies wanting to harm others Current Homicidal Intent: No Current Homicidal Plan: No Describe Current Homicidal Plan: Denies current plan Access to Homicidal Means: No Identified Victim: None at this time History of harm to  others?: No Assessment of Violence: None Noted Violent Behavior Description: Patient can be redirected Does patient have access to weapons?: No Criminal Charges Pending?: No Does patient have a court date: No  Psychosis Hallucinations: None noted Delusions: Persecutory (Admits other person "Selena Batten" made her mad but says she can avoi)  Mental Status Report Appear/Hygiene: Improved Eye Contact: Good Motor Activity: Unremarkable Speech: Logical/coherent;Rapid Level of Consciousness: Alert Mood: Anxious;Preoccupied (Focused on getting into [employer] in January) Affect: Anxious;Preoccupied Anxiety Level: Moderate Thought Processes: Tangential;Circumstantial Judgement: Impaired Orientation: Person;Place;Time;Situation Obsessive Compulsive Thoughts/Behaviors: Moderate  Cognitive Functioning Concentration: Decreased Memory: Recent Intact;Remote Intact IQ: Average Insight: Fair Impulse Control: Poor Appetite: Good Weight Loss: 0  Weight Gain: 0  Sleep: Decreased Total Hours of Sleep: 4  Vegetative Symptoms: None  ADLScreening [facility] Assessment Services) Patient's cognitive ability adequate to safely complete daily activities?: Yes Patient able to express need for assistance with ADLs?: Yes Independently performs ADLs?: Yes  Abuse/Neglect [facility]) Physical Abuse: Denies Verbal Abuse: Denies Sexual Abuse: Denies  Prior Inpatient Therapy Prior Inpatient Therapy: Yes Prior Therapy Dates: 1/12 Prior Therapy Facilty/Provider(s): [facility] Reason for Treatment: delusions  Prior Outpatient Therapy Prior Outpatient Therapy: Yes Prior Therapy Dates: 2012 Prior [facility] Facilty[facility] [place] Reason for Treatment: chronic mental illness  ADL Screening (condition at time of admission) Patient's  cognitive ability adequate to safely complete daily activities?: Yes Patient able to express need for assistance with ADLs?: Yes Independently performs ADLs?: Yes Weakness  of Legs: None Weakness of Arms/Hands: None  Home Assistive Devices/Equipment Home Assistive Devices/Equipment: None    Abuse/Neglect Assessment (Assessment to be complete while patient is alone) Physical Abuse: Denies Verbal Abuse: Denies Sexual Abuse: Denies Exploitation of patient/patient's resources: Denies Self-Neglect: Denies Values / Beliefs Cultural Requests During Hospitalization: None Spiritual Requests During Hospitalization: None Consults Spiritual Care Consult Needed: No Social Work Consult Needed: No Merchant navy officer (For Healthcare) Advance Directive: Patient does not have advance directive Nutrition Screen Diet: Regular  Additional Information 1:1 In Past 12 Months?: No CIRT Risk: Yes Elopement Risk: Yes Does patient have medical clearance?: Yes     Disposition:  Disposition Disposition of Patient: Referred to;Inpatient treatment program Type of inpatient treatment program: Adult Patient referred to: [facility] (Referral material sent to [facility] on 06/19)  On Site Evaluation by:   Reviewed with Physician:     Beatriz Stallion Ray 04/14/2012 10:20 PM

## 2012-04-14 NOTE — ED Notes (Signed)
Sitter with patient.  

## 2012-04-14 NOTE — ED Notes (Signed)
Pt offered ambien or ativan PO and refused. Pt admits to being mildly anxious but doesn't want to take anything so that she can say that she "is tapering off of medications" for tomorrows reevaluation.

## 2012-04-14 NOTE — ED Notes (Signed)
Relieved sitter for break. 

## 2012-04-15 ENCOUNTER — Inpatient Hospital Stay (HOSPITAL_COMMUNITY)
Admission: AD | Admit: 2012-04-15 | Discharge: 2012-04-21 | DRG: 885 | Disposition: A | Discharge status: Home / Self Care | Payer: Federal, State, Local not specified - Other | Attending: Psychiatry | Admitting: Psychiatry

## 2012-04-15 ENCOUNTER — Encounter (HOSPITAL_COMMUNITY): Payer: Self-pay

## 2012-04-15 DIAGNOSIS — F333 Major depressive disorder, recurrent, severe with psychotic symptoms: Secondary | ICD-10-CM

## 2012-04-15 DIAGNOSIS — F22 Delusional disorders: Principal | ICD-10-CM | POA: Diagnosis present

## 2012-04-15 MED ORDER — ALUM & MAG HYDROXIDE-SIMETH 200-200-20 MG/5ML PO SUSP: 30.0000 mL | ORAL | Status: DC | PRN

## 2012-04-15 MED ORDER — HYDROXYZINE HCL 25 MG PO TABS
25.0000 mg | ORAL_TABLET | Freq: Every evening | ORAL | Status: DC | PRN
  Administered 2012-04-15 – 2012-04-20 (×6): 25 mg via ORAL
  Filled 2012-04-15 (×15): qty 1

## 2012-04-15 MED ORDER — ACETAMINOPHEN 325 MG PO TABS: 650.0000 mg | ORAL_TABLET | Freq: Four times a day (QID) | ORAL | Status: DC | PRN

## 2012-04-15 MED ORDER — HALOPERIDOL 2 MG PO TABS
2.0000 mg | ORAL_TABLET | Freq: Every day | ORAL | Status: DC
  Administered 2012-04-15: 2 mg via ORAL
  Filled 2012-04-15: qty 2
  Filled 2012-04-15 (×3): qty 1

## 2012-04-15 MED ORDER — MAGNESIUM HYDROXIDE 400 MG/5ML PO SUSP: 30.0000 mL | Freq: Every day | ORAL | Status: DC | PRN
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
## 2012-04-15 NOTE — ED Notes (Signed)
Called [facility] to [facility]

## 2012-04-15 NOTE — ED Notes (Signed)
Patient speaking of wanting to have a different sitter at bedside.  Patient becoming manipulative stating she "does not feel safe with sitter".  Patient was explained all sitters have same training and she is safe with sitter that is at bedside.  House coverage notified.  House coverage states that there is not enough sitters to switch.  Per sitter in room, patient was given limits and did not like the limits that were given to her.

## 2012-04-15 NOTE — ED Notes (Signed)
Observe patient walk to restroom with sitter

## 2012-04-15 NOTE — BH Assessment (Signed)
Assessment Note   [name] is an 46 y.o. female that was reassessed this day.  Pt smiling, reporting she is doing good today.  Pt denies SI or HI currently.  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  Pt stated she is ready to go home.  Writer informed pt that inpatient treatment recommended.  Called [facility] and per [facility] @ [phone], pt on wait list for [facility].  Completed reassessment, assessment notification and faxed to [facility] to log.  Updated ED staff.  Previous Note:  [name] is an 46 y.o. female.  [name] was seen this evening by this clinician. Nurses notes indicate that patient was hiding food in her room. She has been agitated (verbal) with staff but is able to be redirected. With this clinician she was improved from when he saw her on 06/17. Pt continues to deny SI and A/V hallucinations. When asked if she wanted to kill someone she denies and said that she had said that she wanted to hurt this other woman. When asked if she still felt this way she said no and that she can avoid being around this other person because she hardly ever sees this other female out in public. She talked a lot about wanting to apply for a Ph.D program in education at [employer]. She is frustrated because the application is on-line for a January start date and the application has to be completed on-line before the end of June. She said that if she does not get into this program she will move to [place] and start anew. She also plans to start with [facility] of the [place] upon discharge. Clinician did tell her that the two previous telepsychiatry consults had recommended inpatient care. This clinician completed the referral to [facility]. Currently awaiting placement on their wait list.   Axis I: Psychotic Disorder NOS Axis II: Deferred Axis III:  Past Medical History  Diagnosis Date  . Anxiety   . Major depressive disorder, recurrent, severe with  psychotic features    Axis IV: occupational problems, other psychosocial or environmental problems, problems related to social environment and problems with primary support group Axis V: 21-30 behavior considerably influenced by delusions or hallucinations OR serious impairment in judgment, communication OR inability to function in almost all areas  Past Medical History:  Past Medical History  Diagnosis Date  . Anxiety   . Major depressive disorder, recurrent, severe with psychotic features     History reviewed. No pertinent past surgical history.  Family History: History reviewed. No pertinent family history.  Social History:  reports that she has never smoked. She does not have any smokeless tobacco history on file. She reports that she does not drink alcohol or use illicit drugs.  Additional Social History:  Alcohol / Drug Use Pain Medications: na Prescriptions: see list Over the Counter: see list History of alcohol / drug use?: No history of alcohol / drug abuse Longest period of sobriety (when/how long): na Negative Consequences of Use:  (na) Withdrawal Symptoms:  (na)  CIWA: CIWA-Ar BP: 106/69 mmHg Pulse Rate: 59  COWS:    Allergies: No Known Allergies  Home Medications:  (Not in a hospital admission)  OB/GYN Status:  No LMP recorded. Patient is postmenopausal.  General Assessment Data Location of Assessment: [facility] ED ACT Assessment: Yes Living Arrangements: Alone Can pt return to current living arrangement?: Yes Admission Status: Involuntary Is patient capable of signing voluntary admission?: No (Pt on  IVC) Transfer from: Acute Hospital Referral Source: Self/Family/Friend  Education Status Is patient currently in school?: No  Risk to self Suicidal Ideation: No Suicidal Intent: No Is patient at risk for suicide?: No Suicidal Plan?: No Access to Means: No What has been your use of drugs/alcohol within the last 12 months?: None Previous Attempts/Gestures:  No How many times?: 0  Other Self Harm Risks: Delusional Triggers for Past Attempts: None known Intentional Self Injurious Behavior: None Family Suicide History: Unknown Recent stressful life event(s): Conflict (Comment) (Reports conflict with boyfriend's family) Persecutory voices/beliefs?: No Depression: Yes Depression Symptoms: Loss of interest in usual pleasures;Feeling worthless/self pity Substance abuse history and/or treatment for substance abuse?: No Suicide prevention information given to non-admitted patients: Not applicable  Risk to Others Homicidal Ideation: No Thoughts of Harm to Others: No Comment - Thoughts of Harm to Others: Pt currently denies wanting to harm others Current Homicidal Intent: No Current Homicidal Plan: No Describe Current Homicidal Plan: Denies current plan Access to Homicidal Means: No Identified Victim: none at this time History of harm to others?: No Assessment of Violence: None Noted Violent Behavior Description: na - pt calm, can be redirected Does patient have access to weapons?: No Criminal Charges Pending?: No Does patient have a court date: No  Psychosis Hallucinations: None noted Delusions: Persecutory  Mental Status Report Appear/Hygiene: Improved Eye Contact: Good Motor Activity: Unremarkable Speech: Logical/coherent;Rapid Level of Consciousness: Alert Mood: Anxious (Focused on getting released from hospital) Affect: Anxious Anxiety Level: Moderate Thought Processes: Tangential Judgement: Impaired Orientation: Person;Place;Time;Situation Obsessive Compulsive Thoughts/Behaviors: Moderate  Cognitive Functioning Concentration: Decreased Memory: Recent Intact;Remote Intact IQ: Average Insight: Fair Impulse Control: Poor Appetite: Good Weight Loss: 0  Weight Gain: 0  Sleep: Decreased Total Hours of Sleep: 4  Vegetative Symptoms: None  ADLScreening [facility] Assessment Services) Patient's cognitive ability adequate to  safely complete daily activities?: Yes Patient able to express need for assistance with ADLs?: Yes Independently performs ADLs?: Yes  Abuse/Neglect [facility]) Physical Abuse: Denies Verbal Abuse: Denies Sexual Abuse: Denies  Prior Inpatient Therapy Prior Inpatient Therapy: Yes Prior Therapy Dates: 1/12 Prior Therapy Facilty/Provider(s): [facility] Reason for Treatment: delusions  Prior Outpatient Therapy Prior Outpatient Therapy: Yes Prior Therapy Dates: 2012 Prior [facility] Facilty[facility] [place] Reason for Treatment: chronic mental illness  ADL Screening (condition at time of admission) Patient's cognitive ability adequate to safely complete daily activities?: Yes Patient able to express need for assistance with ADLs?: Yes Independently performs ADLs?: Yes Weakness of Legs: None Weakness of Arms/Hands: None  Home Assistive Devices/Equipment Home Assistive Devices/Equipment: None    Abuse/Neglect Assessment (Assessment to be complete while patient is alone) Physical Abuse: Denies Verbal Abuse: Denies Sexual Abuse: Denies Exploitation of patient/patient's resources: Denies Self-Neglect: Denies Values / Beliefs Cultural Requests During Hospitalization: None Spiritual Requests During Hospitalization: None Consults Spiritual Care Consult Needed: No Social Work Consult Needed: No Merchant navy officer (For Healthcare) Advance Directive: Patient does not have advance directive Nutrition Screen Diet: Regular  Additional Information 1:1 In Past 12 Months?: No CIRT Risk: Yes Elopement Risk: Yes Does patient have medical clearance?: Yes     Disposition:  Disposition Disposition of Patient: Referred to;Inpatient treatment program Type of inpatient treatment program: Adult [facility]: [facility] ([facility])  On Site Evaluation by:   Reviewed with Physician:     Caryl Comes 04/15/2012 10:49 AM

## 2012-04-15 NOTE — ED Notes (Signed)
Pt requesting hot cocoa, pretzels and shortbread cookies stating that she did not eat her breakfast

## 2012-04-15 NOTE — ED Notes (Signed)
Sitter at bedside.

## 2012-04-15 NOTE — ED Notes (Addendum)
Talked to [facility] in Dietary about what the patient can order on the menu. Monica advised me that the patients on a regular diet can have anything on the menu as long as it is 1 entree, 2 sides,  Dessert and a Bread. Explain the information to patient and she verbally stated that she understands and agrees

## 2012-04-15 NOTE — ED Notes (Signed)
Ordered second lunch tray, patient would not eat the first one.

## 2012-04-15 NOTE — BH Assessment (Signed)
Assessment Note    Received a call from [facility] at [facility] stating that pt was accepted [facility], as pt's acuity has lowered since her stay in the ED.  Pt accepted by Alessandra Grout, NP to Dr. Allena Katz to bed 406-2 and can arrive at [facility] at 1730.  Completed support paperwork, updated assessment disposition, completed assessment notification and faxed to [facility] to log.  Udpated EDP Jacubowitz and ED staff.  ED staff to arrange transport via [facility] to [facility] as pt is IVC.    Disposition:  Disposition Disposition of Patient: Inpatient treatment program Type of inpatient treatment program: Adult Patient referred to: Other (Comment) (Pt accepted [facility])  On Site Evaluation by:   Reviewed with Physician:  Alyson Locket 04/15/2012 4:50 PM

## 2012-04-15 NOTE — Tx Team (Signed)
Initial Interdisciplinary Treatment Plan  PATIENT STRENGTHS: (choose at least two) Communication skills  PATIENT STRESSORS: ?   PROBLEM LIST: Problem List/Patient Goals Date to be addressed Date deferred Reason deferred Estimated date of resolution  Paranoid Psychosis                                                       DISCHARGE CRITERIA:  Improved stabilization in mood, thinking, and/or behavior  PRELIMINARY DISCHARGE PLAN: Outpatient therapy  PATIENT/FAMIILY INVOLVEMENT: This treatment plan has been presented to and reviewed with the patient, [name], and/or family member.  The patient and family have been given the opportunity to ask questions and make suggestions.  Gretta Arab [facility] 04/15/2012, 8:21 PM

## 2012-04-15 NOTE — ED Notes (Signed)
Pt refused breakfast and lunch tray and requested to have something from the [place], Spoke with food services to see if it is ok for the patient to order from grille. Food service rep stated that she would contact me once she gets the information

## 2012-04-15 NOTE — ED Notes (Signed)
Ordered patient lunch tray

## 2012-04-15 NOTE — ED Notes (Signed)
Called and placed order to dietary for dinner tray 

## 2012-04-15 NOTE — ED Notes (Signed)
Pt is currently sleeping/ will continue to monitor

## 2012-04-16 DIAGNOSIS — F22 Delusional disorders: Principal | ICD-10-CM

## 2012-04-16 MED ORDER — RISPERIDONE 2 MG PO TABS
2.0000 mg | ORAL_TABLET | Freq: Every day | ORAL | Status: DC
  Administered 2012-04-16 – 2012-04-17 (×2): 2 mg via ORAL
  Filled 2012-04-16 (×6): qty 1

## 2012-04-16 NOTE — Tx Team (Signed)
Interdisciplinary Treatment Plan Update (Adult)  Date:  04/16/2012  Time Reviewed:  10:15AM-11:15AM  Progress in Treatment: Attending groups:  Yes, even though new Participating in groups: Yes, fully engaged    Taking medication as prescribed:    Yes Tolerating medication:   Yes Family/Significant other contact made:  Refuses, does not want any friends or family to know she is at [facility] Patient understands diagnosis:   Yes, fair insight, fair judgment Discussing patient identified problems/goals with staff:   Yes, completely involved in care Medical problems stabilized or resolved:   None Denies suicidal/homicidal ideation:  Yes Issues/concerns per patient self-inventory:   None Other:    New problem(s) identified: No, Describe:  None  Reason for Continuation of Hospitalization: Delusions  Medication stabilization  Interventions implemented related to continuation of hospitalization:  Medication monitoring and adjustment, safety checks Q15 min., suicide risk assessment, group therapy, psychoeducation, collateral contact, aftercare planning, ongoing physician assessments, medication education  Additional comments:  Not applicable  Estimated length of stay:  3-4 days  Discharge Plan:  Return to her apartment alone, follow up with [facility] the [place] for counseling, look into options for Medication Management  New goal(s):  Not applicable  Review of initial/current patient goals per problem list:   1.  Goal(s):  Medication stabilization.  Met:  No  Target date:  By Discharge   As evidenced by:  New patient  2.  Goal(s):  Patient will be able to identify a comprehensive wellness plan prior to discharge, including psychiatric follow-up, counseling, groups, and/or how to address physical wellbeing.  Met:  No  Target date:  By Discharge   As evidenced by:  Will continue to work on this throughout her stay, especially in groups  3.  Goal(s):   Reduce psychotic  symptoms to baseline per patient and collateral.  Met:  No  Target date:  By Discharge   As evidenced by:  Patient is paranoid about her friend using surveillance equipment, even though she has not seen her for 2 years  4.  Goal(s):  Deny SI & HI for 48 hours prior to D/C.  Met:  Yes  Target date:  By Discharge   As evidenced by:  Has been denying for over 48 hours.  Attendees: Patient:  [name]  04/16/2012 10:15AM-11:15AM  Family:     Physician:  Dr. [name] Readling 04/16/2012 10:15AM-11:15AM  Nursing:   Manuela Schwartz, RN 04/16/2012 10:15AM -11:15AM   Case Manager:  Ambrose Mantle, LCSW 04/16/2012 10:15AM-11:15AM  Counselor:  Marni Griffon, LCAS 04/16/2012 10:15AM-11:15AM  Other:   Clent Demark, LCAS-A 04/16/2012   Other:      Other:      Other:       Scribe for Treatment Team:   Sarina Ser, 04/16/2012, 10:15AM-11:15AM

## 2012-04-16 NOTE — Progress Notes (Signed)
Psychoeducational Group Note  Date:  04/16/2012 Time:  2030  Group Topic/Focus:  Wrap-Up Group:   The focus of this group is to help patients review their daily goal of treatment and discuss progress on daily workbooks.  Participation Level:  Active  Participation Quality:  Appropriate, Attentive and Sharing  Affect:  Appropriate  Cognitive:  Appropriate  Insight:  Good  Engagement in Group:  Good  Additional Comments:  Pt attended wrap up group this evening.  Pt expressed concerns about being ready to go back to work.  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  Pt was concerned that no one know she is in inpatient care with her career in same field.  Reassured pt that confidentiality is held to things spoke upon in this wrap up session.  Aundria Rud, Amedee Cerrone L 04/16/2012, 8:30 PM
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
## 2012-04-16 NOTE — Discharge Planning (Signed)
Met with patient in Aftercare Planning Group.   She reported that she lives alone and will take the bus to get there at discharge.  She was asked about allowing [name] to contact anyone in her family or support system, and she refused adamantly.  Patient stated that she goes to [facility] the [place] and sees counselor Frederick Peers.  She used to get medication management through the [facility], but has not been for some time so did not know that there is no longer an association there.  Case Manager called [facility], left a message requesting a follow-up appointment with Ms. Laural Benes, and requesting an update on med mgmt status.  If patient cannot receive med mgmt at [facility], she does not want to go to [facility].  Patient states that her main goal for this hospitalization is to create a better overall wellness plan going forward.  No case management needs today.  Ambrose Mantle, LCSW 04/16/2012, 4:42 PM
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
## 2012-04-16 NOTE — Progress Notes (Signed)
04/16/2012         Time: 0930      Group Topic/Focus: The focus of the group is on enhancing the patients' ability to cope with stressors by understanding what coping is, why it is important, the negative effects of stress and developing healthier coping skills. Patients practice [place] and discuss how exercise can be used as a healthy coping strategy.   Participation Level: None  Participation Quality: Resistant  Affect: Labile  Cognitive: Alert  Additional Comments: Patient came to group late, disgusted, after MHT would not give her the purple sanitation wipes to clean the furniture in the dayroom. Patient wrapped in a blanket, tearful at times, but on approach would smile and say she was ok, resistant to processing.    Jefry Lesinski 04/16/2012 11:51 AM

## 2012-04-16 NOTE — Progress Notes (Signed)
[facility] Group Notes:  (Counselor/Nursing/MHT/Case Management/Adjunct)  04/16/2012 2:30 pm   Type of Therapy: Group Therapy   Participation Level: Minimal   Participation Quality:  Sharing, Attentive   Affect: Blunted  Cognitive:  Oriented, Alert  Insight: Poor  Engagement in Group: Limited  Modes of Intervention: Clarification, Education, Problem-solving, Socialization and Support, Activity   Summary of Progress/Problems: Pt participated in group by listening attentively and self disclosing.  Therapist prompted patients to identify one thing they would like to change.  Pt explained that she would like to become involved with professional groups and stay away from the wrong people.  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  Therapist explained this was not a program and offered suggestions. Therapist explained the Affirmations Exercise and prompted Pt to give and receive positive affirmations to others.  Pt actively participated. Therapist offered support and encouragement.  Minimal Progress noted.  Intervention effective.         Marni Griffon C 04/16/2012, 2:30 PM

## 2012-04-17 NOTE — Progress Notes (Signed)
Patient ID: Emeri Estill, female   DOB: 02-11-1966, 46 y.o.   MRN: [id number]  [facility] Group Notes:  (Counselor/Nursing/MHT/Case Management/Adjunct)  04/17/2012 11 AM  Type of Therapy:  Aftercare Planning, Group Therapy, Dance/Movement Therapy   Participation Level:  Minimal  Participation Quality:  Drowsy  Affect:  Depressed  Cognitive:  Appropriate  Insight:  Limited   Engagement in Group: Limited  Engagement in Therapy:  Limited  Modes of Intervention:  Clarification, Problem-solving, Role-play, Socialization and Support  Summary of Progress/Problems: After Care: Pt. attended and participated in aftercare planning group. Pt. accepted information on suicide prevention, warning signs to look for with suicide and crisis line numbers to use. The pt. agreed to call crisis line numbers if having warning signs or having thoughts of suicide. Pt. listed their  mood as" feeling pretty good". Counseling:  Therapist discussed the difference between healthy and bad coping mechanisms.  Therapist discussed reasons why healthy coping skills are needed instead of bad skills. Therapist asked group to name at least one healthy coping skill and one bad coping skill. Pt. stated that  a bad skill is having negative thoughts. One healthy skill is supporting others and changing your scenery.  Rhunette Croft

## 2012-04-17 NOTE — Progress Notes (Signed)
Pt has been isolative and has had minimal interaction with others   Pt has attended groups with minimal participation    Pt has not spoken of her delusion of being monitored with some type of device and does not appear to be responding to internal stimuli   She is still guarded in her interactions and what she says   Verbal support given  Medications administered and effectiveness monitored   Q 15 min checks   Pt safe at present
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
## 2012-04-17 NOTE — Progress Notes (Signed)
[facility] Adult Inpatient Family/Significant Other Suicide Prevention Education  Suicide Prevention Education:  Patient Refusal for Family/Significant Other Suicide Prevention Education: The patient [name] has refused to provide written consent for family/significant other to be provided Family/Significant Other Suicide Prevention Education during admission and/or prior to discharge.  Physician notified.  Pt. accepted information on suicide prevention, warning signs to look for with suicide and crisis line numbers to use. The pt. agreed to call crisis line numbers if having warning signs or having thoughts of suicide.  Pt stated that she does not want her family to know she is here.  [facility] 04/17/2012, 10:22 AM

## 2012-04-17 NOTE — Progress Notes (Signed)
Patient ID: [name], female   DOB: 08/31/1966, 46 y.o.   MRN: [id number] D: Patient very pleasant on approach today. Stated that she doesn't feel that she needs to be here in hospital. When talking to her in room and during treatment team she expresses a lot of paranoia about a former friend that she says she hasn't seen in 2 years. Talks about her and her video survelance equipment and the jealousy that the person feels towards her. Doesn't sound reality based. A: Staff will monitor and redirect as needed. R: Patient cooperating with staff and going to groups thus far.
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
## 2012-04-17 NOTE — Progress Notes (Signed)
  [name] is a 47 y.o. female [id number] [birth year]  04/15/2012 Principal Problem:  *Delusional disorder   Mental Status: Mood is calm minimally responsive denies SI/HI/AVH.    Subjective/Objective: Was friendly and appropriate with me. Wants to leave.Has poor insight and judgement .    Filed Vitals:   04/17/12 0824  BP: 120/82  Pulse: 89  Temp: 97.8 F (36.6 C)  Resp: 18    Lab Results:   BMET    Component Value Date/Time   NA 139 04/10/2012 2318   K 3.8 04/10/2012 2318   CL 103 04/10/2012 2318   CO2 22 04/10/2012 2318   GLUCOSE 161* 04/10/2012 2318   BUN 20 04/10/2012 2318   CREATININE 1.04 04/10/2012 2318   CALCIUM 9.3 04/10/2012 2318   GFRNONAA 64* 04/10/2012 2318   GFRAA 75* 04/10/2012 2318    Medications:  Scheduled:     . hydrOXYzine  25 mg Oral QHS,MR X 1  . risperiDONE  2 mg Oral QHS  . DISCONTD: haloperidol  2 mg Oral QHS     PRN Meds acetaminophen, alum & mag hydroxide-simeth, magnesium hydroxide Continue current plan of care.   [name],[name] 04/17/2012

## 2012-04-17 NOTE — Progress Notes (Signed)
At 0058 pt came to med window asking for something to help her sleep. Repeat dose of vistaril given.

## 2012-04-18 MED ORDER — RISPERIDONE 1 MG PO TBDP: 1.0000 mg | ORAL_TABLET | Freq: Four times a day (QID) | ORAL | Status: DC | PRN

## 2012-04-18 NOTE — Progress Notes (Signed)
Patient ID: [name], female   DOB: 1966/09/04, 46 y.o.   MRN: [id number] Has been out and about on the hall this evening, attended group, doesn't initiate conversation, but will engage. Stayed in dayroom watching tv until it was time to go to bed, then came to med window for her meds and initially refused them.  Then agreed to take them, one at a time, with several minutes between each one, and needed encouragement to take them. Came back later for repeat vistaril as she was unable to sleep and had begun walking the hall.  Has been cooperative overall,and has responded well to support and encouragement.  Will continue to monitor.

## 2012-04-18 NOTE — Progress Notes (Signed)
Pt is labile and appears to respond at times to internal stimuli   She attends and participates in group therapy   She was observed in her room cursing out someone who was not there  She was very loud and threatening during her hallucination   She has since calmed down and has been isolative to her room  Verbal support given  Medications administered and effectiveness monitored  Q 15 min checks  Pt safe at present

## 2012-04-18 NOTE — Progress Notes (Signed)
[facility] Group Notes:  (Counselor/Nursing/MHT/Case Management/Adjunct)  04/18/2012 4:01 PM  Type of Therapy:  Group Therapy and discharge planning   Participation Level:  Did Not Attend  Vanetta Mulders, LPCA    Lillyrose Reitan L 04/18/2012, 4:01 PM

## 2012-04-18 NOTE — Progress Notes (Signed)
  [name] is a 46 y.o. female [id number] Aug 28, 1966  04/15/2012 Principal Problem:  *Delusional disorder   Mental Status: Labile responding to internal stimuli although she denies. Says she is angry.    Subjective/Objective: Was talking to herself in bathroom mirror. Then gets aggitated -she's been here 8 days  Why can't we verify what she says . Gets quite colorful  In her language. Refuses follow up at [facility] and now at [facility]. Cannot be reasoned with at this time.  Filed Vitals:   04/18/12 0836  BP: 124/88  Pulse: 96  Temp: 98.5 F (36.9 C)  Resp: 18    Lab Results:   BMET    Component Value Date/Time   NA 139 04/10/2012 2318   K 3.8 04/10/2012 2318   CL 103 04/10/2012 2318   CO2 22 04/10/2012 2318   GLUCOSE 161* 04/10/2012 2318   BUN 20 04/10/2012 2318   CREATININE 1.04 04/10/2012 2318   CALCIUM 9.3 04/10/2012 2318   GFRNONAA 64* 04/10/2012 2318   GFRAA 75* 04/10/2012 2318    Medications:  Scheduled:     . hydrOXYzine  25 mg Oral QHS,MR X 1  . risperiDONE  2 mg Oral QHS     PRN Meds acetaminophen, alum & mag hydroxide-simeth, magnesium hydroxide Plan will order prn Risperdal for outbursts.   Liyat Faulkenberry,[name] 04/18/2012

## 2012-04-19 MED ORDER — HALOPERIDOL 5 MG PO TABS
5.0000 mg | ORAL_TABLET | Freq: Every day | ORAL | Status: DC
  Administered 2012-04-19: 5 mg via ORAL
  Filled 2012-04-19 (×3): qty 1

## 2012-04-19 NOTE — Discharge Planning (Signed)
Met with patient in Aftercare Planning Group.   Case Manager made a follow-up appointment with Frederick Peers at [facility] [place] for 04/23/12 at 1pm, although in group this morning patient said she did not want to go there anymore, as she had not been there for a year.  Since CM had already left a message last week requesting the appointment, they called back and they said that it has not been nearly that long.    No other known case management needs today.  Ambrose Mantle, LCSW 04/19/2012, 3:53 PM

## 2012-04-19 NOTE — Progress Notes (Signed)
D.Pt. Denies Auditory Hallucinations but was seen by writer talking to herself.  Pt. Was angry about having Haldol as a medication A.  Haldol offered to pt. . R.  Pt.Took the Haldol 5 mg. As ordered.  Support given.

## 2012-04-19 NOTE — Progress Notes (Signed)
D. Pt. Pleasant and cooperative.  Discussing books that she has been reading.  Focused on thoughts of school and choosing a new class to take.  Reports how her life has taken a twist and changed.D. Denies SI/HI and denies A/V hallucinations. A.  Encouragement and support given.  R.  Pt. Receptive.
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
## 2012-04-19 NOTE — Progress Notes (Signed)
Patient ID: [name], female   DOB: 02-02-1966, 46 y.o.   MRN: [id number] Has been out walking on the hall this evening, talking about going home and hoping to continue treatment as an outpt, however, when it was time for hs meds, she refused hs dose of risperdal as well as vistaril for sleep.  Went to bed but was awake, stated she would work it out and get herself to sleep without medication and didn't want to be on medication when she is discharged.  Denies A/VH, denies SI/HI.  Interacts with select peers and staff.  Will continue to monitor.
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
## 2012-04-20 MED ORDER — HALOPERIDOL 5 MG PO TABS
10.0000 mg | ORAL_TABLET | Freq: Every day | ORAL | Status: DC
  Administered 2012-04-20: 10 mg via ORAL
  Filled 2012-04-20 (×3): qty 2
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
## 2012-04-20 NOTE — Progress Notes (Signed)
D)Pt. Affect animated and superficial.  Pt. Offered no complaints.  Pt.denied SI/HI and denies A/V hallucinations.  A) Support and staff availability offered. R) pt.remained safe and appropriate in the milieu.

## 2012-04-20 NOTE — Progress Notes (Signed)
Pt has been up and down the hallway most of the night.  She has asked for a sleep aid, but when it was offered she talked herself out of taking it.  She denies hearing voices, but this Clinical research associate observed her talking out loud as if to someone, but no one else was around.  She was heard talking very gruff and sinister, but when staff approached her, her voice changed and she became very soft spoken.  She tells this Clinical research associate she has no needs at this time, and will try to get to sleep on her own.  Safety checks q15 minutes.  Pt remains safe at this time.

## 2012-04-20 NOTE — Progress Notes (Signed)
[facility] Group Notes:  (Counselor/Nursing/MHT/Case Management/Adjunct)  04/20/2012 2:38 PM  Type of Therapy:  Group Therapy  Participation Level:  Minimal  Participation Quality:  Drowsy  Affect:  Blunted  Cognitive:  Oriented  Insight:  Limited  Engagement in Group:  Limited  Engagement in Therapy:  Limited  Modes of Intervention:  Education and Support  Summary of Progress/Problems: Patient was less engaged today because of drowsiness. She had taken her medications the night before. Much more pleasant . Even though she wanted to leave today she was accepting that she may be able to leave on Wednesday. She did not discuss delusional material    Veto Kemps 04/20/2012, 2:38 PM

## 2012-04-20 NOTE — Progress Notes (Signed)
[facility] MD Progress Note  04/20/2012 4:53 PM  Diagnosis:  Axis I: Delusional Disorder.   The patient was seen today and reports the following:   ADL's: Intact.  Sleep: The patient reports to sleeping well last night but according to staff she slept 1.5 hours.  Appetite: The patient reports a good appetite this morning.   Mild>(1-10) >Severe  Hopelessness (1-10): 0  Depression (1-10): 0  Anxiety (1-10): 0   Suicidal Ideation: The patient denies any suicidal ideations today.  Plan: No  Intent: No  Means: No   Homicidal Ideation: The patient denies any homicidal ideations today.  Plan: No  Intent: No.  Means: No   General Appearance/Behavior: The patient was friendly and cooperative today with this provider.  Eye Contact: Good.  Speech: Appropriate in rate and volume with no pressuring noted today.  Motor Behavior: wnl.  Level of Consciousness: Alert and Oriented x 3.  Mental Status: Alert and Oriented x 3.  Mood: Appears euthymic today.  Affect: Bright and full.  Anxiety Level: No anxiety reported today.  Thought Process: Remains delusional in her thinking but much improved. Thought Content: The patient denies any auditory or visual hallucinations today. She expresses much less paranoid delusions today related to a high school friend. Perception: Remains delusional in her thinking but with much improvement.  Judgment: Fair.  Insight: Fair.  Cognition: Oriented to person, place and time.  Sleep:  Number of Hours: 1.5    Vital Signs:Blood pressure 93/62, pulse 80, temperature 98 F (36.7 C), temperature source Oral, resp. rate 16, height 5\' 5"  (1.651 m), weight 103.874 kg (229 lb).  Current Medications: Current Facility-Administered Medications  Medication Dose Route Frequency Provider Last Rate Last Dose  . acetaminophen (TYLENOL) tablet 650 mg  650 mg Oral Q6H PRN Mike Craze, MD      . alum & mag hydroxide-simeth (MAALOX/MYLANTA) 200-200-20 MG/5ML suspension 30 mL  30  mL Oral Q4H PRN Mike Craze, MD      . haloperidol (HALDOL) tablet 10 mg  10 mg Oral QHS Curlene Labrum Nafisah Runions, MD      . hydrOXYzine (ATARAX/VISTARIL) tablet 25 mg  25 mg Oral QHS,MR X 1 Mike Craze, MD   25 mg at 04/18/12 0144  . magnesium hydroxide (MILK OF MAGNESIA) suspension 30 mL  30 mL Oral Daily PRN Mike Craze, MD      . risperiDONE (RISPERDAL M-TABS) disintegrating tablet 1 mg  1 mg Oral Q6H PRN [name] Adams, PA      . DISCONTD: haloperidol (HALDOL) tablet 5 mg  5 mg Oral QHS Curlene Labrum Sausha Raymond, MD   5 mg at 04/19/12 2149   Lab Results: No results found for this or any previous visit (from the past 48 hour(s)).  Review of Systems:  Neurological: The patient denies any headaches today. She denies any seizures or dizziness.  G.I.: The patient denies any constipation or G.I. Upset today.  Musculoskeletal: The patient denies any muscle or skeletal difficulties.   Time was spent today discussing with the patient her current symptoms. The patient states that she slept well last night but according to staff, she slept approximately 1.5 hours. She reports a good appetite and denies any significant feelings of sadness, anhedonia or depressed mood as well as any suicidal or homicidal ideations. The patient denies any anxiety symptoms. She denies any auditory or visual hallucinations and reports less concern about her friend from [employer].  She is taking her po medications  and denies any side effects today.  Treatment Plan Summary:  1. Daily contact with patient to assess and evaluate symptoms and progress in treatment.  2. Medication management  3. The patient will deny suicidal ideations or homicidal ideations for 48 hours prior to discharge and have a depression and anxiety rating of 3 or less. The patient will also deny any auditory or visual hallucinations or delusional thinking.  4. The patient will deny any symptoms of substance withdrawal at time of discharge.   Plan:  1. Will  increase the medication Haldol to 10 mgs po qhs for paranoid delusions.  2. Laboratory studies reviewed.  3. Will continue to monitor.  4. Possible discharge tomorrow.  Makena Murdock 04/20/2012, 4:53 PM

## 2012-04-21 MED ORDER — HALOPERIDOL 10 MG PO TABS: 10.0000 mg | ORAL_TABLET | Freq: Every day | ORAL | Status: DC

## 2012-04-21 NOTE — Progress Notes (Signed)
Patient denies SI/HI, denies A/V hallucinations. Patient verbalized understanding of discharge instructions, follow up care and prescriptions. Patient received all belongings from [facility] locker. Patient escorted out by staff, transported by public bus.

## 2012-04-21 NOTE — Discharge Planning (Signed)
Met with patient in Aftercare Planning Group.   She would like to be discharged today and is calm, goal-directed, and appropriate.  She was sleepy, but awoke when called on.  She stated that the doctor changed her medication 2 days ago and she feels better.  Her Family [facility] counseling appointment was set in [facility] because that is where she used to be seen.  Case Manager shared this with her, and she did ask that it be changed to a [place] appointment since she lives in [place] now.  Case Manager did call and change that appointment to [facility] on 04/23/12 at 2:00 with Frederick Peers.  Also called and requested a [facility] appointment, awaiting call back.  Ambrose Mantle, LCSW 04/21/2012, 9:24 AM

## 2012-04-21 NOTE — Progress Notes (Signed)
04/21/2012         Time: 0930      Group Topic/Focus: The focus of this group is on enhancing the patient's understanding of leisure, barriers to leisure, and the importance of engaging in positive leisure activities upon discharge for improved total health.   Participation Level: Minimal  Participation Quality: Attentive  Affect: Blunted  Cognitive: Alert   Additional Comments: Patient late to group as it appears she had showered. Patient reported reading was her primary interest, but said little else during group discussion.   Loyd Marhefka 04/21/2012 11:54 AM

## 2012-04-21 NOTE — Progress Notes (Signed)
[facility] Case Management Discharge Plan:  Will you be returning to the same living situation after discharge: Yes,  lives alone At discharge, do you have transportation home?:Yes,  bus pass provided Do you have the ability to pay for your medications:Yes,  verbalizes ability to stay on meds  Interagency Information:     Release of information consent forms completed and in the chart;  Patient's signature needed at discharge.  Patient to Follow up at:  Follow-up Information    Follow up with Frederick Peers on 04/23/2012. (2:00PM appointment in [place] office)    Contact information:   [facility]   [address] [place] Telephone:  [phone]      Follow up with Dr. Eulah Pont at [facility] on 04/22/2012. (8:00AM appointment on 04/22/12.  Walk-In Clinic is open Monday-Friday 8:30AM-3:00PM.  Until Family [facility] starts doing medication management again, go to [employer].)    Contact information:   [facility] [address]  [address] Telephone:  [phone]      Follow up with Support group information and [facility] schedule have been provided. (As needed)          Patient denies SI/HI:   Yes,      Safety Planning and Suicide Prevention discussed:  Yes,  During patient's stay, during Aftercare Planning Group, Case Manager provided psychoeducation on "Suicide Prevention Information."  This included descriptions of risk factors for suicide, warning signs that an individual is in crisis and thinking of suicide, and what to do if this occurs.  Pt indicated understanding of information provided, and will read brochure given upon discharge.     Barrier to discharge identified:No.  Summary and Recommendations:  Follow up with counseling.  Follow up with medication management.   Sarina Ser 04/21/2012, 11:38 AM

## 2012-04-21 NOTE — Progress Notes (Signed)
[facility] Group Notes:  (Counselor/Nursing/MHT/Case Management/Adjunct)  04/21/2012 1:24 PM  Type of Therapy:  Group Therapy  Participation Level:  Minimal  Participation Quality:  Drowsy and Sharing  Affect:  Blunted  Cognitive:  Oriented and Delusional  Insight:  Limited  Engagement in Group:  Limited  Engagement in Therapy:  Limited  Modes of Intervention:  Problem-solving, Support and Exploration  Summary of Progress/Problems: Patient was agitated today with her drowsiness. Understands that she has to adjust to the medications. Identified plans to go back to school for PhD. Plans to go to [employer]. Trying to decide between 2 degrees. Her first plan was to go home and go to bed. Later she wants to try to find a job. Continues to minimize her illness.   Danil Wedge, Aram Beecham 04/21/2012, 1:24 PM
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
## 2012-04-21 NOTE — Tx Team (Signed)
Interdisciplinary Treatment Plan Update (Adult)  Date:  04/21/2012  Time Reviewed:  10:15AM-11:15AM  Progress in Treatment: Attending groups:  Yes Participating in groups:    Yes, appears to be fully engaged Taking medication as prescribed:    Yes, no refusals Tolerating medication:   Yes, no side effects have been noted by staff or reported by patient Family/Significant other contact made:  Refuses Patient understands diagnosis:   Yes, fair insight, fair judgment Discussing patient identified problems/goals with staff:   Yes Medical problems stabilized or resolved:   Yes Denies suicidal/homicidal ideation:  Yes Issues/concerns per patient self-inventory:   None Other:    New problem(s) identified: No, Describe:    Reason for Continuation of Hospitalization: None  Interventions implemented related to continuation of hospitalization:  Medication monitoring and adjustment, safety checks Q15 min., suicide risk assessment, group therapy, psychoeducation, collateral contact, aftercare planning, ongoing physician assessments, medication education - UNTIL DISCHARGE  Additional comments:  Not applicable  Estimated length of stay:  Discharge today  Discharge Plan:  Return to her apartment by bus, with bus pass provided by hospital.  Will follow up for counseling with [facility] of the [place].  Will follow up with [facility] for medication management until [facility] starts that service again, then will likely switch.  New goal(s):  Not applicable  Review of initial/current patient goals per problem list:   1.  Goal(s):  Medication stabilization.  Met:  Yes  Target date:  By Discharge   As evidenced by:  Stable for discharge  2.  Goal(s):  Patient will be able to identify a comprehensive wellness plan prior to discharge, including psychiatric follow-up, counseling, groups, and/or how to address physical wellbeing.  Met:  Yes  Target date:  By Discharge   As evidenced by:   Has wellness plan in place  3.  Goal(s):  Reduce psychotic symptoms to baseline per patient and collateral.  Met:  Yes  Target date:  By Discharge   As evidenced by:  Will not pursue any of her complaints about Selena Batten, understands needs to stay away  4.  Goal(s):  Deny SI & HI for 48 hours prior to D/C.  Met:  Yes  Target date:  By Discharge   As evidenced by:  Has been denying, accomplished previously.  Attendees: Patient:  Houda Brau  04/21/2012 10:15AM-11:15AM  Family:     Physician:  Dr. [name] Readling 04/21/2012 10:15AM-11:15AM  Nursing:   Roswell Miners, RN 04/21/2012 10:15AM -11:15AM   Case Manager:  Ambrose Mantle, LCSW 04/21/2012 10:15AM-11:15AM  Counselor:  Veto Kemps, MT-BC 04/21/2012 10:15AM-11:15AM  Other:   Abbie Sons, RN 04/21/2012   Other:      Other:      Other:       Scribe for Treatment Team:   Sarina Ser, 04/21/2012, 10:15AM-11:15AM

## 2012-04-21 NOTE — Progress Notes (Signed)
Pt is in the dayroom watching TV at this time.  She has been appropriate during the shift.  She attended evening wrap-up group.  When this writer first spoke with pt, she was polite and inviting until medication was mentioned.  She then became more serious, saying she was being discharged tomorrow.  Informed pt that she needed to take her medication to show the MD she was being compliant.  When this writer actually took the medication to the pt a short time ago, she took them without any incident.  She denies SI/HI/AV.  She has not been observed by this Clinical research associate tonight responding to internal stimuli.  She voices no needs/concerns.  Safety maintained with q15 minute checks.

## 2012-04-21 NOTE — Progress Notes (Signed)
D) Patient cooperative upon my assessment. Patient refuses to complete "Patient Self Inventory" sheet this morning. Patient states she slept "good."  Patient appears depressed upon my assessment. Patient denies SI/HI, denies A/V hallucinations.   A) Patient offered support and encouragement. Patient educated about plan of care.   R)  Patient verbalizes understanding of care plan. Patient active on unit, attending some groups. Patient remains safe on unit with Q15 minute checks for safety. Will continue to monitor.

## 2012-04-21 NOTE — Progress Notes (Signed)
[facility] Group Notes:  (Counselor/Nursing/MHT/Case Management/Adjunct)  04/21/2012 1:29 PM  Type of Therapy:  Psychoeducational Skills  Participation Level:  Did Not Attend     Veto Kemps 04/21/2012, 1:29 PM

## 2012-04-23 NOTE — Progress Notes (Signed)
Patient Discharge Instructions:  After Visit Summary (AVS):   Faxed to:  04/22/2012 Psychiatric Admission Assessment Note:   Faxed to:  04/22/2012 Suicide Risk Assessment - Discharge Assessment:   Faxed to:  04/22/2012 Faxed/Sent to the Next Level Care provider:  04/22/2012  Faxed to [facility] of the [facility] - Frederick Peers @ [phone]  Wandra Scot, 04/23/2012, 9:50 AM

## 2012-04-26 NOTE — Discharge Summary (Signed)
Physician Discharge Summary Note  Patient:  [name] is an 46 y.o., female 6 days MRN:  [id number] DOB:  02/10/1966 Patient phone:  [phone] (home)  Patient address:   [address] [place] [address],   Date of Admission:  04/15/2012 Date of Discharge: 04/21/2012  Reason for Admission: MDD with psychotic features                                          Delusional disorder Discharge Diagnoses: Principal Problem:  *Delusional disorder   Axis Diagnosis:  Discharge Diagnoses:  AXIS I: Delusional Disorder.  AXIS II: Deferred.  AXIS III: 1. No Acute Health Issues.  AXIS IV: Chronic Mental Illness. Limited Insight into Illness.  AXIS V: GAF at admission approximately 35. GAF at discharge approximatly 50.  Level of Care:  Outpatient  Hospital Course:  [name] was admitted involuntarily due to her paranoid delusions and major depressive disorder when she presented to the [facility].  She was maintained in the Psych ED for 3 days prior to being transferred to [facility] when a bed became available.  She insisted that she was being monitored by people in r church by [employer], and that there multiple conference calls regarding her. She felt that she was being video taped and had called the [employer] and [employer] when no one would respond to her multiple trips to the ED. [name] states that she had to demand to be admitted to [facility], but was committed after a Telepsych evaluation after she tried to leave the ED.  Her behaviors were demanding and oppositional to the staff in the ED. This carried over to her admission at [facility]  Upon admission to the unit, [name] demanded to leave the hospital and was difficult to redirect by staff. She had minimal participation in unit programming and was resistant to medication.      [name] isolated herself in her room and did not interact with other patients or staff. She would respond when spoken to, and was often observed responding verbally to internal  stimulation, which she would deny upon questioning.  [name] often refused her medication, but complied enough to be  Discharged home.  She did not want to follow up at [facility] or any specific group, and would only accept follow up at [facility] of the [place].    Consults:  None  Significant Diagnostic Studies:  None  Discharge Vitals:   Blood pressure 135/93, pulse 101, temperature 97.3 F (36.3 C), temperature source Oral, resp. rate 16, height 5\' 5"  (1.651 m), weight 103.874 kg (229 lb).  Mental Status Exam: See Mental Status Examination and Suicide Risk Assessment completed by Attending Physician prior to discharge.  Discharge destination: Home  Is patient on multiple antipsychotic therapies at discharge:  No   Has Patient had three or more failed trials of antipsychotic monotherapy by history:  No Recommended Plan for Multiple Antipsychotic Therapies: not applicable    Discharge Orders   Future Orders Please Complete By Expires   Diet - low sodium heart healthy      Increase activity slowly      Discharge instructions      Comments:   Please take all medications only as directed and keep all scheduled follow up appointments.     Medication List  As of 04/26/2012 11:07 PM   STOP taking these medications  ALPRAZolam 0.5 MG tablet         TAKE these medications      Indication    haloperidol 10 MG tablet   Commonly known as: HALDOL   Take 1 tablet (10 mg total) by mouth at bedtime. For clarity of thought.            Follow-up Information    Follow up with Frederick Peers on 04/23/2012. (2:00PM appointment in [place] office)    Contact information:   [facility]   [address] [place] Telephone:  [phone]      Follow up with Dr. Eulah Pont at [facility] on 04/22/2012. (8:00AM appointment on 04/22/12.  Walk-In Clinic is open Monday-Friday 8:30AM-3:00PM.  Until [facility] starts doing medication management  again, go to [employer].)    Contact information:   [facility] [address] [place]  [address] Telephone:  [phone]      Follow up with Support group information and [facility] schedule have been provided. (As needed)          Follow-up recommendations: Eat a heart healthy diet.  Exercise daily as tolerated. Get plenty of rest.    Comments:  As she was resistant to medication, it is unlikely that [name] will continue taking the Haldol upon discharge.  Signed: Rona Ravens. Knowledge [place] PAC For Dr. [name] D Readlinng 04/26/2012, 11:07 PM

## 2012-05-13 ENCOUNTER — Encounter (HOSPITAL_COMMUNITY): Payer: Self-pay

## 2012-05-13 ENCOUNTER — Emergency Department (HOSPITAL_COMMUNITY)
Admission: EM | Admit: 2012-05-13 | Discharge: 2012-05-13 | Discharge status: Against Medical Advice | Payer: Self-pay | Attending: Emergency Medicine | Admitting: Emergency Medicine

## 2012-05-13 DIAGNOSIS — F411 Generalized anxiety disorder: Secondary | ICD-10-CM | POA: Insufficient documentation

## 2012-05-13 DIAGNOSIS — F329 Major depressive disorder, single episode, unspecified: Secondary | ICD-10-CM | POA: Insufficient documentation

## 2012-05-13 DIAGNOSIS — F3289 Other specified depressive episodes: Secondary | ICD-10-CM | POA: Insufficient documentation

## 2012-05-13 DIAGNOSIS — R404 Transient alteration of awareness: Secondary | ICD-10-CM | POA: Insufficient documentation

## 2012-05-13 NOTE — ED Notes (Signed)
EAV:[id number]<JX> Expected date:05/13/12<BR> Expected time: 1:48 PM<BR> Means of arrival:Ambulance<BR> Comments:<BR> Found unresponsive on side walk.  A&amp;O with EMS, states she&#39;s &quot;tired&quot;

## 2012-05-13 NOTE — ED Notes (Signed)
EMS was called because pt witnessed walking down street, decided to lay down on the sidewalk, and felt asleep. When EMS arrival, pt whispered she's "tired", denied any other symptoms includes pain. Hx of anxiety and depression. Won't provide much further information to the EMS.

## 2012-05-13 NOTE — ED Notes (Signed)
Pt requesting to leave AMA, form signed and IV removed, pt left after being triaged but before being seen by MD.

## 2012-05-14 ENCOUNTER — Emergency Department (HOSPITAL_COMMUNITY)
Admission: EM | Admit: 2012-05-14 | Discharge: 2012-05-14 | Disposition: A | Discharge status: Home / Self Care | Payer: Self-pay | Attending: Emergency Medicine | Admitting: Emergency Medicine

## 2012-05-14 ENCOUNTER — Encounter (HOSPITAL_COMMUNITY): Payer: Self-pay | Admitting: Emergency Medicine

## 2012-05-14 DIAGNOSIS — F339 Major depressive disorder, recurrent, unspecified: Secondary | ICD-10-CM | POA: Insufficient documentation

## 2012-05-14 DIAGNOSIS — F329 Major depressive disorder, single episode, unspecified: Secondary | ICD-10-CM

## 2012-05-14 DIAGNOSIS — F411 Generalized anxiety disorder: Secondary | ICD-10-CM | POA: Insufficient documentation

## 2012-05-14 DIAGNOSIS — F200 Paranoid schizophrenia: Secondary | ICD-10-CM

## 2012-05-14 LAB — CBC
Hemoglobin: 10.5 g/dL — ABNORMAL LOW (ref 12.0–15.0)
MCHC: 32.4 g/dL (ref 30.0–36.0)
Platelets: 251 10*3/uL (ref 150–400)
RDW: 14 % (ref 11.5–15.5)

## 2012-05-14 LAB — COMPREHENSIVE METABOLIC PANEL
AST: 18 U/L (ref 0–37)
Albumin: 3.3 g/dL — ABNORMAL LOW (ref 3.5–5.2)
CO2: 23 mEq/L (ref 19–32)
Calcium: 8.9 mg/dL (ref 8.4–10.5)
Creatinine, Ser: 0.83 mg/dL (ref 0.50–1.10)
GFR calc non Af Amer: 85 mL/min — ABNORMAL LOW (ref >90)

## 2012-05-14 LAB — GLUCOSE, CAPILLARY: Glucose-Capillary: 90 mg/dL (ref 70–99)

## 2012-05-14 LAB — RAPID URINE DRUG SCREEN, HOSP PERFORMED
Barbiturates: NOT DETECTED
Benzodiazepines: NOT DETECTED
Cocaine: NOT DETECTED
Tetrahydrocannabinol: NOT DETECTED

## 2012-05-14 LAB — ACETAMINOPHEN LEVEL: Acetaminophen (Tylenol), Serum: <15 ug/mL (ref 10–30)

## 2012-05-14 MED ORDER — ZOLPIDEM TARTRATE 5 MG PO TABS: 5.0000 mg | ORAL_TABLET | Freq: Every evening | ORAL | Status: DC | PRN

## 2012-05-14 MED ORDER — NICOTINE 21 MG/24HR TD PT24: 21.0000 mg | MEDICATED_PATCH | Freq: Every day | TRANSDERMAL | Status: DC

## 2012-05-14 MED ORDER — IBUPROFEN 600 MG PO TABS: 600.0000 mg | ORAL_TABLET | Freq: Three times a day (TID) | ORAL | Status: DC | PRN

## 2012-05-14 MED ORDER — ALUM & MAG HYDROXIDE-SIMETH 200-200-20 MG/5ML PO SUSP: 30.0000 mL | ORAL | Status: DC | PRN

## 2012-05-14 MED ORDER — ACETAMINOPHEN 325 MG PO TABS: 650.0000 mg | ORAL_TABLET | ORAL | Status: DC | PRN

## 2012-05-14 MED ORDER — ONDANSETRON HCL 4 MG PO TABS: 4.0000 mg | ORAL_TABLET | Freq: Three times a day (TID) | ORAL | Status: DC | PRN

## 2012-05-14 MED ORDER — HALOPERIDOL 5 MG PO TABS: 10.0000 mg | ORAL_TABLET | Freq: Every day | ORAL | Status: DC

## 2012-05-14 MED ORDER — LORAZEPAM 1 MG PO TABS: 1.0000 mg | ORAL_TABLET | Freq: Three times a day (TID) | ORAL | Status: DC | PRN

## 2012-05-14 NOTE — [facility] Counselor (Signed)
Pt was evaluated by psychiatrist who recommended pt to be d/c back to her OPT care with [facility] of the [place]. Completed IVC rescind paperwork.

## 2012-05-14 NOTE — ED Provider Notes (Signed)
History     CSN: [id number]  Arrival date & time 05/14/12  0030   First MD Initiated Contact with Patient 05/14/12 0044      Chief Complaint  Patient presents with  . Anxiety   Level V applies secondary to patient cooperation and psychiatric disorder  HPI  History provided by EMS and recent medical chart. Patient is a 46 year old female with history of anxiety and major depressive disorder with psychiatric features. Patient was found by EMS outside of the apartment building lying down in the street hyperventilating. Patient did not respond to any questioning by EMS but seem to follow some commands occasionally. Patient had normal vital signs and CBG. Patient was seen in emergency room earlier in the day but left AMA. Patient has history of prior MRSA revisits for anxiety and depressive disorders. She has admissions to behavioral health for the same.     Past Medical History  Diagnosis Date  . Anxiety   . Major depressive disorder, recurrent, severe with psychotic features     History reviewed. No pertinent past surgical history.  No family history on file.  History  Substance Use Topics  . Smoking status: Never Smoker   . Smokeless tobacco: Never Used  . Alcohol Use: Yes     socially    OB History    Grav Para Term Preterm Abortions TAB SAB Ect Mult Living                  Review of Systems  Unable to perform ROS: Psychiatric disorder    Allergies  Review of patient's allergies indicates no known allergies.  Home Medications   Current Outpatient Rx  Name Route Sig Dispense Refill  . HALOPERIDOL 10 MG PO TABS Oral Take 1 tablet (10 mg total) by mouth at bedtime. For clarity of thought. 30 tablet 0    BP 125/73  Pulse 67  Temp 98.8 F (37.1 C) (Oral)  Resp 18  SpO2 97%  Physical Exam  Nursing note and vitals reviewed. Constitutional: She appears well-developed and well-nourished. No distress.  HENT:  Head: Normocephalic and atraumatic.  Eyes:  Conjunctivae are normal. Pupils are equal, round, and reactive to light.  Cardiovascular: Normal rate and regular rhythm.   No murmur heard. Pulmonary/Chest: Effort normal and breath sounds normal. No respiratory distress. She has no wheezes. She has no rales.  Abdominal: Soft. She exhibits no distension.  Musculoskeletal: She exhibits no edema.  Neurological:       Patient does spontaneously open eyes to her name. She occasionally follows commands. Patient only mumbles and does not talk or speak.  Skin: Skin is warm and dry.    ED Course  Procedures  Results for orders placed during the hospital encounter of 05/14/12  URINE RAPID DRUG SCREEN (HOSP PERFORMED)      Component Value Range   Opiates NONE DETECTED  NONE DETECTED   Cocaine NONE DETECTED  NONE DETECTED   Benzodiazepines NONE DETECTED  NONE DETECTED   Amphetamines NONE DETECTED  NONE DETECTED   Tetrahydrocannabinol NONE DETECTED  NONE DETECTED   Barbiturates NONE DETECTED  NONE DETECTED  COMPREHENSIVE METABOLIC PANEL      Component Value Range   Sodium 137  135 - 145 mEq/L   Potassium 4.1  3.5 - 5.1 mEq/L   Chloride 104  96 - 112 mEq/L   CO2 23  19 - 32 mEq/L   Glucose, Bld 108 (*) 70 - 99 mg/dL   BUN 17  6 - 23 mg/dL   Creatinine, Ser 9.14  0.50 - 1.10 mg/dL   Calcium 8.9  8.4 - 78.2 mg/dL   Total Protein 6.5  6.0 - 8.3 g/dL   Albumin 3.3 (*) 3.5 - 5.2 g/dL   AST 18  0 - 37 U/L   ALT 19  0 - 35 U/L   Alkaline Phosphatase 119 (*) 39 - 117 U/L   Total Bilirubin 0.2 (*) 0.3 - 1.2 mg/dL   GFR calc non Af Amer 85 (*) >90 mL/min   GFR calc Af Amer >90  >90 mL/min  ETHANOL      Component Value Range   Alcohol, Ethyl (B) <11  0 - 11 mg/dL  ACETAMINOPHEN LEVEL      Component Value Range   Acetaminophen (Tylenol), Serum <15.0  10 - 30 ug/mL  GLUCOSE, CAPILLARY      Component Value Range   Glucose-Capillary 90  70 - 99 mg/dL  CBC      Component Value Range   WBC 5.2  4.0 - 10.5 K/uL   RBC 4.03  3.87 - 5.11 MIL/uL     Hemoglobin 10.5 (*) 12.0 - 15.0 g/dL   HCT 95.6 (*) 21.3 - 08.6 %   MCV 80.4  78.0 - 100.0 fL   MCH 26.1  26.0 - 34.0 pg   MCHC 32.4  30.0 - 36.0 g/dL   RDW 57.8  46.9 - 62.9 %   Platelets 251  150 - 400 K/uL       1. Depression       MDM  1:10AM  patient seen and evaluated. Patient does not speak or answer questions. Patient occasionally follows commands. She moves all extremities.  Patient has similar presentations to the emergency room the past. Patient was evaluated at [facility] for several days seemed to improve stay.  3:30 AM patient is now speaking. Patient states that she became very stressed and whenever she becomes stressed out she just has to "lay down". Patient denies any SI or HI he feels safe at home. She denies any changes in her medications.  Patient discussed with [facility] act team.  They're arranging for placement. Patient will be moved to psych ED. Holding orders in place.      Angus Seller, [place] 05/14/12 [phone]
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
## 2012-05-14 NOTE — BHH Counselor (Signed)
IVC paperwork including [facility] receipt confirmed by [name]. Original affidavit placed in IVC log in Psych ED and [facility] and copies of affidavit put in pt's chart.

## 2012-05-14 NOTE — ED Notes (Signed)
Security present to wand patient.

## 2012-05-14 NOTE — ED Notes (Signed)
Per Clydie Braun Act Dr Sheryle Spray re-sended IVC paperwork, placed with chart pt ok'ed to be d/c home reports that she advised EDP Lynelle Doctor

## 2012-05-14 NOTE — ED Notes (Signed)
Pt able to ambulate.  

## 2012-05-14 NOTE — ED Notes (Signed)
Pt reports that she doesn't smoke

## 2012-05-14 NOTE — ED Notes (Signed)
[id number]<AV> Expected date:<BR> Expected time:<BR> Means of arrival:<BR> Comments:<BR> EMS/anxiety attack-hx schizophrenia

## 2012-05-14 NOTE — ED Notes (Addendum)
Report given via EMS. Pt found in parking lot of apartment building laying down on ground hyperventilating with no trauma. Apartment door was open, priest knew her name. Pt does not talk to EMS, but is able to follow commands. Pt Sat 100 Pulse 74BP 120 palpated CBG 102 at 0004. NKA. Hx of anxiety, major depressive disorder, schizophrenia. Pt has prescriptions for Haldol. [facility] armband from today present on arrival.

## 2012-05-14 NOTE — ED Notes (Signed)
Dr Hassan Rowan MD at bedside.

## 2012-05-14 NOTE — ED Provider Notes (Addendum)
Pt resting this am. No acute medical issues.  Awaiting psychiatric disposition. Filed Vitals:   05/14/12 0706  BP: 127/81  Pulse: 68  Temp: 98 F (36.7 C)  Resp: 18     Celene Kras, MD 05/14/12 0740  Pt assessed by Dr Shela Commons.  Pt does not require hospitalization.  Safe for outpt follow up  Celene Kras, MD 05/14/12 1235
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
## 2012-05-14 NOTE — ED Notes (Signed)
Lab called to obtain labs

## 2012-05-14 NOTE — ED Notes (Signed)
Report called to psyche ED. 

## 2012-05-31 NOTE — ED Provider Notes (Signed)
Medical screening examination/treatment/procedure(s) were performed by non-physician practitioner and as supervising physician I was immediately available for consultation/collaboration.  Olivia Mackie, MD 05/31/12 2140

## 2012-06-29 ENCOUNTER — Emergency Department (HOSPITAL_COMMUNITY)
Admission: EM | Admit: 2012-06-29 | Discharge: 2012-06-29 | Disposition: A | Discharge status: Home / Self Care | Payer: Self-pay | Attending: Emergency Medicine | Admitting: Emergency Medicine

## 2012-06-29 ENCOUNTER — Encounter (HOSPITAL_COMMUNITY): Payer: Self-pay | Admitting: Emergency Medicine

## 2012-06-29 DIAGNOSIS — F339 Major depressive disorder, recurrent, unspecified: Secondary | ICD-10-CM | POA: Insufficient documentation

## 2012-06-29 DIAGNOSIS — F410 Panic disorder [episodic paroxysmal anxiety] without agoraphobia: Secondary | ICD-10-CM

## 2012-06-29 DIAGNOSIS — F411 Generalized anxiety disorder: Secondary | ICD-10-CM | POA: Insufficient documentation

## 2012-06-29 DIAGNOSIS — F430 Acute stress reaction: Secondary | ICD-10-CM

## 2012-06-29 DIAGNOSIS — R55 Syncope and collapse: Secondary | ICD-10-CM | POA: Insufficient documentation

## 2012-06-29 NOTE — ED Provider Notes (Signed)
History     CSN: [id number]  Arrival date & time 06/29/12  1559   First MD Initiated Contact with Patient 06/29/12 1643      Chief Complaint  Patient presents with  . Loss of Consciousness    (Consider location/radiation/quality/duration/timing/severity/associated sxs/prior treatment) HPI Comments: "i just got some bad news and I lost it.  I got up and tried to walk away but they had already called the ambulance.  I'm so sorry."    [name] presents via EMS after a reported syncopal episode at a local bus stop.  She states she was given some "bad news" regarding her nieces and sister.  Per EMS she is stated to have "fallen out" once, was tearful and mumbling something about "my babies" and then collapsed again.  She states that she has had similar episodes in the past usually associated with emotionally upsetting situations or grief.  She denies cheat pain, palpitations, shortness of breath, SI, HI, intoxication, or drug overdose.  She states she feels fine now and requests to be allowed to leave the ER.  Patient is a 46 y.o. female presenting with syncope. The history is provided by the patient and the EMS personnel. No language interpreter was used.  Loss of Consciousness This is a new problem. The current episode started less than 1 hour ago. The problem has been resolved. Pertinent negatives include no chest pain, no abdominal pain, no headaches and no shortness of breath. Nothing aggravates the symptoms. Nothing relieves the symptoms. She has tried nothing for the symptoms.    Past Medical History  Diagnosis Date  . Anxiety   . Major depressive disorder, recurrent, severe with psychotic features     History reviewed. No pertinent past surgical history.  History reviewed. No pertinent family history.  History  Substance Use Topics  . Smoking status: Never Smoker   . Smokeless tobacco: Never Used  . Alcohol Use: Yes     socially    OB History    Grav Para Term Preterm  Abortions TAB SAB Ect Mult Living                  Review of Systems  Constitutional: Negative.  Negative for fever, chills, diaphoresis, activity change, appetite change and fatigue.  HENT: Negative.   Eyes: Negative.   Respiratory: Negative for apnea, cough, choking, chest tightness, shortness of breath and wheezing.   Cardiovascular: Positive for syncope. Negative for chest pain, palpitations and leg swelling.  Gastrointestinal: Negative.  Negative for nausea, vomiting, abdominal pain, diarrhea, blood in stool and abdominal distention.  Genitourinary: Negative for dysuria, urgency, frequency, vaginal discharge, difficulty urinating and dyspareunia.  Musculoskeletal: Negative for back pain, joint swelling and arthralgias.  Neurological: Positive for light-headedness. Negative for dizziness, tremors, syncope, weakness, numbness and headaches.  Psychiatric/Behavioral: Positive for dysphoric mood. Negative for suicidal ideas, hallucinations, confusion, disturbed wake/sleep cycle, self-injury, decreased concentration and agitation. The patient is nervous/anxious. The patient is not hyperactive.     Allergies  Review of patient's allergies indicates no known allergies.  Home Medications   Current Outpatient Rx  Name Route Sig Dispense Refill  . HALOPERIDOL 10 MG PO TABS Oral Take 1 tablet (10 mg total) by mouth at bedtime. For clarity of thought. 30 tablet 0    BP 120/79  Pulse 87  Temp 98.7 F (37.1 C) (Oral)  Resp 16  SpO2 96%  Physical Exam  Nursing note and vitals reviewed. Constitutional: She is oriented to person, place, and  time. She appears well-developed and well-nourished. No distress.       Pt tearful, easily consoled.  HENT:  Head: Normocephalic and atraumatic.  Right Ear: External ear normal.  Left Ear: External ear normal.  Nose: Nose normal.  Mouth/Throat: Oropharynx is clear and moist. No oropharyngeal exudate.  Eyes: Conjunctivae and EOM are normal. Pupils  are equal, round, and reactive to light. Right eye exhibits no discharge. Left eye exhibits no discharge. No scleral icterus.  Neck: Normal range of motion. Neck supple. No JVD present. No tracheal deviation present. No thyromegaly present.  Cardiovascular: Normal rate, regular rhythm, normal heart sounds and intact distal pulses.  Exam reveals no gallop and no friction rub.   No murmur heard. Pulmonary/Chest: Effort normal and breath sounds normal. No stridor. No respiratory distress. She has no wheezes. She has no rales. She exhibits no tenderness.  Abdominal: Soft. Bowel sounds are normal. She exhibits no distension and no mass. There is no tenderness. There is no rebound and no guarding.       Protuberant, obese  Musculoskeletal: Normal range of motion. She exhibits no edema and no tenderness.  Lymphadenopathy:    She has no cervical adenopathy.  Neurological: She is alert and oriented to person, place, and time. No cranial nerve deficit. Coordination normal.  Skin: Skin is warm and dry. No rash noted. She is not diaphoretic. No erythema. No pallor.  Psychiatric: Thought content normal. Her mood appears anxious. Her affect is not angry, not blunt, not labile and not inappropriate. Her speech is not rapid and/or pressured, not delayed, not tangential and not slurred. She is withdrawn. She is not agitated, not aggressive, is not hyperactive, not slowed, not actively hallucinating and not combative. Thought content is not paranoid and not delusional. Cognition and memory are normal. She exhibits a depressed mood. She expresses no homicidal and no suicidal ideation. She expresses no suicidal plans and no homicidal plans. She is noncommunicative.       Pt refuses to elaborate on the nature of the report she received just prior to coming to the ER.  She is steadfast when she states that she does not want to harm herself or anyone else.  She refuses any further evaluation or resources. She is attentive.     ED Course  Procedures (including critical care time)  Labs Reviewed - No data to display No results found.   No diagnosis found.    MDM  Patient presents status post what appeared to be a witnessed syncopal event at a local bus stop. She states however that she just received bad news via telephone regarding her nieces. She denies any history of heart disease, heart dysrhythmias, chest pain, lung disease, stroke, or neurologic dysfunction. She does state however that she has a long history of anxiety that has not been treated. Review of notes available and affect reveals she has been evaluated at least 7 times in 2013 mostly for anxiety-related events. She denies intentions of harming herself, previous episodes or suicide attempts, intentions to harm anyone else, previous homicide attempts. She reports that she does not have a local primary physician, is currently unemployed, but receiving unemployment benefits. She refuses to elaborate on the specifics of the bad news that led to outburst at the bus stop. I have offered social services, psychiatric services, and police services but she has refused these. She appears appropriate at this time. She is alert and oriented. She has no focal neurologic deficits. She's been advised she can  return to the emergency department at any time for completion of this evaluation or to obtain access to any of the services previously mentioned. Plan at this time is to discharge home. Encourage outpatient followup with both local psychiatric services as well as with her primary care physician. She says she will consider this recommendation.        Tobin Chad, MD 06/29/12 [phone]

## 2012-06-29 NOTE — ED Notes (Signed)
Per EMS.  Called out because pt had syncopal episode at bus stop.  Upon arrival pt was trying to walk away across parking lot.  EMS attempted to talk to pt, pt did not want to go to ED.  Pt began to have second syncopal episode and nearly collapsed.  Pt crying and alternatingly starts laughing.  Pt mentioned not communcative.  Mentioned someone dying and "my babies."  Would not specify further.  Would not answer questions about incident.

## 2012-06-29 NOTE — ED Notes (Signed)
Pt states she got bad news about nieces and wants to leave.  Pt states "I'll be all right."  Informed pt it would be better to be seen by MD for syncopal episode to prevent recurrence.  Told MD of pt's plan to leave before being seen.

## 2012-06-29 NOTE — ED Notes (Signed)
Gave pt money to get bus ride.  Pt states she understands discharge instructions.

## 2012-08-13 ENCOUNTER — Emergency Department (HOSPITAL_COMMUNITY)
Admission: EM | Admit: 2012-08-13 | Discharge: 2012-08-14 | Disposition: A | Discharge status: Home / Self Care | Payer: Self-pay | Attending: Emergency Medicine | Admitting: Emergency Medicine

## 2012-08-13 DIAGNOSIS — F489 Nonpsychotic mental disorder, unspecified: Secondary | ICD-10-CM | POA: Insufficient documentation

## 2012-08-13 DIAGNOSIS — R300 Dysuria: Secondary | ICD-10-CM | POA: Insufficient documentation

## 2012-08-13 DIAGNOSIS — R350 Frequency of micturition: Secondary | ICD-10-CM | POA: Insufficient documentation

## 2012-08-13 DIAGNOSIS — F99 Mental disorder, not otherwise specified: Secondary | ICD-10-CM

## 2012-08-13 DIAGNOSIS — F332 Major depressive disorder, recurrent severe without psychotic features: Secondary | ICD-10-CM | POA: Insufficient documentation

## 2012-08-13 LAB — POCT I-STAT, CHEM 8
HCT: 49 % — ABNORMAL HIGH (ref 36.0–46.0)
Hemoglobin: 16.7 g/dL — ABNORMAL HIGH (ref 12.0–15.0)
Potassium: 4 mEq/L (ref 3.5–5.1)
Sodium: 143 mEq/L (ref 135–145)

## 2012-08-13 LAB — CBC
HCT: 42.4 % (ref 36.0–46.0)
Hemoglobin: 13.8 g/dL (ref 12.0–15.0)
MCH: 25.6 pg — ABNORMAL LOW (ref 26.0–34.0)
MCHC: 32.5 g/dL (ref 30.0–36.0)
RDW: 13.7 % (ref 11.5–15.5)

## 2012-08-13 LAB — POCT PREGNANCY, URINE: Preg Test, Ur: NEGATIVE

## 2012-08-13 LAB — RAPID URINE DRUG SCREEN, HOSP PERFORMED
Amphetamines: NOT DETECTED
Barbiturates: NOT DETECTED
Tetrahydrocannabinol: NOT DETECTED

## 2012-08-13 MED ORDER — HALOPERIDOL 1 MG PO TABS
1.0000 mg | ORAL_TABLET | Freq: Once | ORAL | Status: AC
Start: 2012-08-13 — End: 2012-08-13
  Administered 2012-08-13: 1 mg via ORAL
  Filled 2012-08-13: qty 1

## 2012-08-13 MED ORDER — IBUPROFEN 600 MG PO TABS: 600.0000 mg | ORAL_TABLET | Freq: Three times a day (TID) | ORAL | Status: DC | PRN

## 2012-08-13 MED ORDER — ZOLPIDEM TARTRATE 5 MG PO TABS: 5.0000 mg | ORAL_TABLET | Freq: Every evening | ORAL | Status: DC | PRN

## 2012-08-13 MED ORDER — HALOPERIDOL 1 MG PO TABS: 1.0000 mg | ORAL_TABLET | Freq: Two times a day (BID) | ORAL | Status: DC

## 2012-08-13 MED ORDER — ACETAMINOPHEN 325 MG PO TABS: 650.0000 mg | ORAL_TABLET | ORAL | Status: DC | PRN

## 2012-08-13 MED ORDER — LORAZEPAM 1 MG PO TABS: 1.0000 mg | ORAL_TABLET | Freq: Three times a day (TID) | ORAL | Status: DC | PRN

## 2012-08-13 MED ORDER — ONDANSETRON HCL 4 MG PO TABS: 4.0000 mg | ORAL_TABLET | Freq: Three times a day (TID) | ORAL | Status: DC | PRN

## 2012-08-13 NOTE — ED Notes (Signed)
Pt distraught over being transferred to psych ed and inconsolable; haldol po given.
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
## 2012-08-13 NOTE — ED Notes (Signed)
Pt is shaking head and trying to shake body; will not help take clothes off or put paper scrubs on; pt is jerking extremities; vital signs stable; will continue to monitor patient
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
## 2012-08-13 NOTE — ED Notes (Signed)
ZOX:[id number]<AV> Expected date:08/13/12<BR> Expected time: 1:38 PM<BR> Means of arrival:Ambulance<BR> Comments:<BR> EMS

## 2012-08-14 MED ORDER — ZIPRASIDONE HCL 20 MG PO CAPS
40.0000 mg | ORAL_CAPSULE | Freq: Two times a day (BID) | ORAL | Status: DC
  Filled 2012-08-14: qty 2

## 2012-08-14 MED ORDER — ZIPRASIDONE MESYLATE 20 MG IM SOLR: 20.0000 mg | Freq: Two times a day (BID) | INTRAMUSCULAR | Status: DC | PRN
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
## 2012-08-14 NOTE — ED Notes (Signed)
ACT into see 

## 2012-08-14 NOTE — ED Notes (Signed)
EDP into see 

## 2012-08-14 NOTE — ED Notes (Addendum)
Sitting quietly in room,  Calm, pleasent, cooperative, waiting for dc instructions.  EDP is aware that the pt is leaving AMA.

## 2012-08-14 NOTE — ED Notes (Signed)
Pt is leaving AMA, EDP aware, safety contract and OP referals give to pt prior to dc by ACT.  Pt encouraged to return for any thoughts of si/hi, and follow up as needed. Pt denied si/hi/avh on dc. Pt verbalized understanding  Pt declined to speak with [employer] prior to leaving and reported she would contact them after she returned home.  Bus pass give, belongs returned to pt after dc. Pt ambulatory to dc window.

## 2012-08-14 NOTE — ED Notes (Signed)
Sitting quietly in room , watching tv 
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
## 2012-08-14 NOTE — ED Provider Notes (Signed)
6:32 AM Dr. Trisha Mangle recommends Geodon 40mg  PO BID and 20mg  IM Q12 hours PRN agitation.   Hanley Seamen, MD 08/14/12 [phone]
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
## 2012-08-15 NOTE — ED Provider Notes (Signed)
Medical screening examination/treatment/procedure(s) were performed by non-physician practitioner and as supervising physician I was immediately available for consultation/collaboration.   Loren Racer, MD 08/15/12 2337

## 2012-08-23 ENCOUNTER — Encounter (HOSPITAL_COMMUNITY): Payer: Self-pay | Admitting: Emergency Medicine

## 2012-08-23 ENCOUNTER — Emergency Department (HOSPITAL_COMMUNITY)
Admission: EM | Admit: 2012-08-23 | Discharge: 2012-08-24 | Disposition: A | Discharge status: Home / Self Care | Payer: Self-pay | Attending: Emergency Medicine | Admitting: Emergency Medicine

## 2012-08-23 DIAGNOSIS — Z5900 Homelessness unspecified: Secondary | ICD-10-CM | POA: Insufficient documentation

## 2012-08-23 DIAGNOSIS — F3289 Other specified depressive episodes: Secondary | ICD-10-CM | POA: Insufficient documentation

## 2012-08-23 DIAGNOSIS — Z8659 Personal history of other mental and behavioral disorders: Secondary | ICD-10-CM | POA: Insufficient documentation

## 2012-08-23 DIAGNOSIS — F329 Major depressive disorder, single episode, unspecified: Secondary | ICD-10-CM | POA: Insufficient documentation

## 2012-08-23 NOTE — ED Notes (Signed)
Pt observed in waiting room talking to herself, ticking, and hitting purse

## 2012-08-23 NOTE — ED Notes (Signed)
Pt states she was evicted from her apartment this am and is not having a good day   Pt states she has no where to go at this time and the shelter is full tonight

## 2012-08-24 NOTE — ED Notes (Signed)
Pt states that she was evicted from her apartment this am and has no where to go; no room was available at the shelter per pt; pt denies being suicidal or homicidal or denies needing assistance with pysch issues; pt states "I am here to see a Child psychotherapist to see what they can do for me"; pt states "I have no where to go"
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
## 2012-08-24 NOTE — ED Notes (Signed)
Patient is alert and oriented x3.  She was given DC instructions and follow up visit instructions.  Patient gave verbal understanding. She was DC ambulatory under his own power to home.  V/S stable.  He was not showing any signs of distress on DC 

## 2012-08-24 NOTE — ED Provider Notes (Addendum)
Medical screening examination/treatment/procedure(s) were performed by non-physician practitioner and as supervising physician I was immediately available for consultation/collaboration.  Olivia Mackie, MD 08/24/12 0422  6:22 AM Upon discharge from the emergency department, patient reports that she feels hopeless and overwhelmed, and does not feel that she is safe to go home. Patient states that she does not have a plan for suicide, but reports she feels suicidal "in her heart". We'll readmit to the emergency department, get screening labs, place and paper scrubs and placed on suicide watch. Will have act team member talk with the patient.  Olivia Mackie, MD 08/24/12 [phone]  6:27 AM Patient now feels that she is safe to go home. She says that she hasn't even tried to giver outpatient resources a go yet, and will followup at the resources given  Olivia Mackie, MD 08/24/12 [phone]

## 2012-08-27 ENCOUNTER — Encounter (HOSPITAL_COMMUNITY): Payer: Self-pay | Admitting: *Deleted

## 2012-08-27 DIAGNOSIS — R45851 Suicidal ideations: Secondary | ICD-10-CM | POA: Insufficient documentation

## 2012-08-27 DIAGNOSIS — F333 Major depressive disorder, recurrent, severe with psychotic symptoms: Secondary | ICD-10-CM | POA: Insufficient documentation

## 2012-08-27 DIAGNOSIS — F411 Generalized anxiety disorder: Secondary | ICD-10-CM | POA: Insufficient documentation

## 2012-08-27 NOTE — ED Notes (Signed)
Pt was kicked out of her apartment 3 days ago and has been on the street since then.  Pt thinks if she slashes her wrist or takes pills it will make it all go away.  Pt took pills in the past (can't remember what kind).

## 2012-08-28 ENCOUNTER — Inpatient Hospital Stay (HOSPITAL_COMMUNITY)
Admission: RE | Admit: 2012-08-28 | Discharge: 2012-09-27 | DRG: 885 | Disposition: A | Discharge status: Home / Self Care | Payer: Federal, State, Local not specified - Other | Source: Ambulatory Visit | Attending: Psychiatry | Admitting: Psychiatry

## 2012-08-28 ENCOUNTER — Encounter (HOSPITAL_COMMUNITY): Payer: Self-pay

## 2012-08-28 ENCOUNTER — Emergency Department (HOSPITAL_COMMUNITY)
Admission: EM | Admit: 2012-08-28 | Discharge: 2012-08-28 | Disposition: A | Discharge status: Other Acute Inpatient Hospital | Payer: Self-pay | Attending: Emergency Medicine | Admitting: Emergency Medicine

## 2012-08-28 DIAGNOSIS — I10 Essential (primary) hypertension: Secondary | ICD-10-CM | POA: Diagnosis present

## 2012-08-28 DIAGNOSIS — Z5900 Homelessness unspecified: Secondary | ICD-10-CM

## 2012-08-28 DIAGNOSIS — F411 Generalized anxiety disorder: Secondary | ICD-10-CM | POA: Diagnosis present

## 2012-08-28 DIAGNOSIS — E669 Obesity, unspecified: Secondary | ICD-10-CM | POA: Diagnosis present

## 2012-08-28 DIAGNOSIS — R45851 Suicidal ideations: Secondary | ICD-10-CM

## 2012-08-28 DIAGNOSIS — F333 Major depressive disorder, recurrent, severe with psychotic symptoms: Principal | ICD-10-CM | POA: Diagnosis present

## 2012-08-28 DIAGNOSIS — Z6841 Body Mass Index (BMI) 40.0 and over, adult: Secondary | ICD-10-CM

## 2012-08-28 DIAGNOSIS — E119 Type 2 diabetes mellitus without complications: Secondary | ICD-10-CM | POA: Clinically undetermined

## 2012-08-28 DIAGNOSIS — F22 Delusional disorders: Secondary | ICD-10-CM

## 2012-08-28 LAB — RAPID URINE DRUG SCREEN, HOSP PERFORMED
Amphetamines: NOT DETECTED
Barbiturates: NOT DETECTED
Benzodiazepines: NOT DETECTED
Cocaine: NOT DETECTED
Tetrahydrocannabinol: NOT DETECTED

## 2012-08-28 LAB — COMPREHENSIVE METABOLIC PANEL
ALT: 20 U/L (ref 0–35)
AST: 21 U/L (ref 0–37)
Albumin: 4 g/dL (ref 3.5–5.2)
Alkaline Phosphatase: 145 U/L — ABNORMAL HIGH (ref 39–117)
BUN: 17 mg/dL (ref 6–23)
Chloride: 106 mEq/L (ref 96–112)
Potassium: 4.3 mEq/L (ref 3.5–5.1)
Sodium: 142 mEq/L (ref 135–145)
Total Bilirubin: 0.2 mg/dL — ABNORMAL LOW (ref 0.3–1.2)
Total Protein: 7.6 g/dL (ref 6.0–8.3)

## 2012-08-28 LAB — CBC WITH DIFFERENTIAL/PLATELET
Basophils Absolute: 0 10*3/uL (ref 0.0–0.1)
Basophils Relative: 0 % (ref 0–1)
Eosinophils Absolute: 0.2 10*3/uL (ref 0.0–0.7)
Hemoglobin: 11.7 g/dL — ABNORMAL LOW (ref 12.0–15.0)
MCH: 25.7 pg — ABNORMAL LOW (ref 26.0–34.0)
MCHC: 31.9 g/dL (ref 30.0–36.0)
Monocytes Relative: 4 % (ref 3–12)
Neutro Abs: 3.6 10*3/uL (ref 1.7–7.7)
Neutrophils Relative %: 55 % (ref 43–77)
Platelets: 328 10*3/uL (ref 150–400)

## 2012-08-28 LAB — POCT PREGNANCY, URINE: Preg Test, Ur: NEGATIVE

## 2012-08-28 LAB — ETHANOL: Alcohol, Ethyl (B): <11 mg/dL (ref 0–11)

## 2012-08-28 MED ORDER — ALUM & MAG HYDROXIDE-SIMETH 200-200-20 MG/5ML PO SUSP
30.0000 mL | ORAL | Status: DC | PRN
Start: 2012-08-28 — End: 2012-08-28

## 2012-08-28 MED ORDER — HYDROXYZINE HCL 50 MG PO TABS: 50.0000 mg | ORAL_TABLET | Freq: Every evening | ORAL | Status: DC | PRN

## 2012-08-28 MED ORDER — MAGNESIUM HYDROXIDE 400 MG/5ML PO SUSP
30.0000 mL | Freq: Every day | ORAL | Status: DC | PRN
  Administered 2012-09-16: 30 mL via ORAL

## 2012-08-28 MED ORDER — HYDROXYZINE HCL 25 MG PO TABS
25.0000 mg | ORAL_TABLET | Freq: Four times a day (QID) | ORAL | Status: DC | PRN
  Administered 2012-09-26: 25 mg via ORAL

## 2012-08-28 MED ORDER — ACETAMINOPHEN 325 MG PO TABS
650.0000 mg | ORAL_TABLET | Freq: Four times a day (QID) | ORAL | Status: DC | PRN
  Administered 2012-08-30 – 2012-09-08 (×3): 650 mg via ORAL

## 2012-08-28 MED ORDER — TRAZODONE HCL 50 MG PO TABS
50.0000 mg | ORAL_TABLET | Freq: Every evening | ORAL | Status: DC | PRN
  Administered 2012-08-28: 50 mg via ORAL
  Filled 2012-08-28: qty 1

## 2012-08-28 MED ORDER — LORAZEPAM 1 MG PO TABS
1.0000 mg | ORAL_TABLET | Freq: Three times a day (TID) | ORAL | Status: DC | PRN
  Administered 2012-08-28: 1 mg via ORAL
  Filled 2012-08-28: qty 1

## 2012-08-28 MED ORDER — IBUPROFEN 200 MG PO TABS: 600.0000 mg | ORAL_TABLET | Freq: Three times a day (TID) | ORAL | Status: DC | PRN

## 2012-08-28 MED ORDER — ALUM & MAG HYDROXIDE-SIMETH 200-200-20 MG/5ML PO SUSP: 30.0000 mL | ORAL | Status: DC | PRN

## 2012-08-28 MED ORDER — ONDANSETRON HCL 8 MG PO TABS
4.0000 mg | ORAL_TABLET | Freq: Three times a day (TID) | ORAL | Status: DC | PRN
Start: 2012-08-28 — End: 2012-08-28

## 2012-08-28 MED ORDER — RISPERIDONE 1 MG PO TABS
1.0000 mg | ORAL_TABLET | Freq: Every day | ORAL | Status: DC
  Administered 2012-08-28 – 2012-08-31 (×3): 1 mg via ORAL
  Filled 2012-08-28 (×7): qty 1

## 2012-08-28 MED ORDER — RISPERIDONE 0.5 MG PO TBDP: 0.5000 mg | ORAL_TABLET | Freq: Four times a day (QID) | ORAL | Status: DC | PRN

## 2012-08-28 MED ORDER — LORAZEPAM 1 MG PO TABS
2.0000 mg | ORAL_TABLET | Freq: Four times a day (QID) | ORAL | Status: DC | PRN
  Administered 2012-08-29: 2 mg via ORAL
  Filled 2012-08-28: qty 2

## 2012-08-28 NOTE — ED Provider Notes (Signed)
History     CSN: [id number]  Arrival date & time 08/27/12  2327   First MD Initiated Contact with Patient 08/28/12 0019      Chief Complaint  Patient presents with  . Suicidal    (Consider location/radiation/quality/duration/timing/severity/associated sxs/prior treatment) HPI History provided by pt.   Pt presents w/ c/o suicidal ideation x 5d.  She is a very poor historian.  She has a plan but does not elaborate.  H/o suicide attempt.  She is homicidal as well.  Denies psych history, including depression/anxiety.  Denies alcohol and illicit drug abuse.  Per prior chart, pt has a h/o severe recurrent depression as well as anxiety and has been seen in the ED multiple times for psychiatric complaints.   Past Medical History  Diagnosis Date  . Anxiety   . Major depressive disorder, recurrent, severe with psychotic features     History reviewed. No pertinent past surgical history.  Family History  Problem Relation Age of Onset  . Hypertension Other   . Diabetes Other     History  Substance Use Topics  . Smoking status: Never Smoker   . Smokeless tobacco: Never Used  . Alcohol Use: Yes     socially    OB History    Grav Para Term Preterm Abortions TAB SAB Ect Mult Living                  Review of Systems  All other systems reviewed and are negative.    Allergies  Review of patient's allergies indicates no known allergies.  Home Medications  No current outpatient prescriptions on file.  BP 143/83  Pulse 66  Temp 98.7 F (37.1 C) (Oral)  Resp 18  SpO2 100%  Physical Exam  Nursing note and vitals reviewed. Constitutional: She is oriented to person, place, and time. She appears well-developed and well-nourished. No distress.  HENT:  Head: Normocephalic and atraumatic.  Eyes:       Normal appearance  Neck: Normal range of motion.  Pulmonary/Chest: Effort normal.  Musculoskeletal: Normal range of motion.  Neurological: She is alert and oriented to  person, place, and time.  Psychiatric:       Pt speaks very softly and mumbles.  She does not make direct eye contact.  Flat affect.     ED Course  Procedures (including critical care time)  Labs Reviewed  CBC WITH DIFFERENTIAL - Abnormal; Notable for the following:    Hemoglobin 11.7 (*)     MCH 25.7 (*)     All other components within normal limits  COMPREHENSIVE METABOLIC PANEL - Abnormal; Notable for the following:    Glucose, Bld 111 (*)     Alkaline Phosphatase 145 (*)     Total Bilirubin 0.2 (*)     GFR calc non Af Amer 88 (*)     All other components within normal limits  ETHANOL  ACETAMINOPHEN LEVEL  POCT PREGNANCY, URINE  URINE RAPID DRUG SCREEN (HOSP PERFORMED)   No results found.   1. Suicidal ideation       MDM  46yo F w/ h/o severe depression as well as anxiety, presents w/ c/o SI.  ACT team consulted, pt moved to CDU and holding orders written.  1:59 AM         Otilio Miu, PA 08/28/12 [phone]

## 2012-08-28 NOTE — ED Notes (Signed)
Patient is requesting "something" to make her go to sleep.  Explained to patient that a provider will evaluate her and decide if she needs any medication.  Patient is cooperative and calm at this time.

## 2012-08-28 NOTE — ED Notes (Signed)
Pt moved to pod c from fast track  Calm sleepy sitter at the bedside. Will document  Personal belongings

## 2012-08-28 NOTE — ED Notes (Signed)
Breakfast tray requested.

## 2012-08-28 NOTE — Progress Notes (Signed)
1930 Nursing Admit Note this is a black female who comes to [facility] from [facility] ED. She shares that she is homeless, that she's been living on the street for 3 days because she got kicked out of her apt. SHe denies known drug allergies, says her PMH is sign for depression and anxiety, and denies any other known diagnoses. She is UAL without diff, tolerating well. She becomes indignant, somewhat agitated and makes grandiose gestures, while admit nurse continues down the assessment list of questions. She is quite vague about her history, refusing to share how or why she got kicked out of her apt. She will not share her social history, as well. She contracts for safety. SHe says she has been a pt in this hospital before and " I know all that" when nurse tries to educate pt about the unit. After assessment completed, she is taken to her room and care contd
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
## 2012-08-28 NOTE — BH Assessment (Signed)
Assessment Note  Update:  Received call stating pt accepted to [facility] by Shavon to Dr. Thedore Mins to bed 402-1 and that pt could be transported to [facility].  Updated EDP Ranae Palms and ED staff.  Updated assessment disposition, completed assessment notification and support paperwork.  Faxed to [facility] to log.  ED staff to arrange transport to [facility] via security as pt is voluntary.     Disposition:  Disposition Disposition of Patient: Inpatient treatment program Type of inpatient treatment program: Adult Patient referred to: Other (Comment) (Pt accepted [facility])  On Site Evaluation by:   Reviewed with Physician:  Caryl Asp, Rennis Harding 08/28/2012 3:28 PM

## 2012-08-28 NOTE — ED Notes (Signed)
Engineer, materials and Wes, EMT transporting pt to [facility].  Belongings given to security.

## 2012-08-28 NOTE — ED Notes (Signed)
Notified Consulting civil engineer and [facility] about pt

## 2012-08-28 NOTE — ED Notes (Signed)
Called ACT team to advise that pt was in need of assessment.  ACT counselor aware and will be here shortly.

## 2012-08-28 NOTE — ED Notes (Signed)
Sitter remains at the bedside 

## 2012-08-28 NOTE — ED Notes (Signed)
Pt sleeping sitter at the bedside 

## 2012-08-28 NOTE — ED Notes (Addendum)
Personal belongings  Listed and placed in bags and placed in lockers 1. 2..  Copy of list placed on the pts chart also

## 2012-08-28 NOTE — ED Notes (Signed)
Act team at bedside 

## 2012-08-29 DIAGNOSIS — F332 Major depressive disorder, recurrent severe without psychotic features: Secondary | ICD-10-CM

## 2012-08-29 DIAGNOSIS — F411 Generalized anxiety disorder: Secondary | ICD-10-CM

## 2012-08-29 MED ORDER — TRAZODONE HCL 100 MG PO TABS
100.0000 mg | ORAL_TABLET | Freq: Every evening | ORAL | Status: DC | PRN
  Administered 2012-08-30 – 2012-09-26 (×31): 100 mg via ORAL
  Filled 2012-08-29 (×6): qty 1
  Filled 2012-08-29: qty 6
  Filled 2012-08-29 (×28): qty 1

## 2012-08-29 MED ORDER — RISPERIDONE 1 MG PO TBDP
1.0000 mg | ORAL_TABLET | Freq: Four times a day (QID) | ORAL | Status: DC | PRN
  Administered 2012-08-29 – 2012-09-06 (×2): 1 mg via ORAL
  Filled 2012-08-29 (×2): qty 1

## 2012-08-29 MED ORDER — CITALOPRAM HYDROBROMIDE 10 MG PO TABS
10.0000 mg | ORAL_TABLET | Freq: Every day | ORAL | Status: DC
  Administered 2012-08-29 – 2012-08-31 (×3): 10 mg via ORAL
  Filled 2012-08-29 (×4): qty 1

## 2012-08-29 MED ORDER — DIVALPROEX SODIUM 125 MG PO CPSP
125.0000 mg | ORAL_CAPSULE | Freq: Two times a day (BID) | ORAL | Status: DC
  Administered 2012-08-29 – 2012-09-03 (×11): 125 mg via ORAL
  Filled 2012-08-29 (×13): qty 1

## 2012-08-29 NOTE — [facility] Suicide Risk Assessment (Signed)
Suicide Risk Assessment  Admission Assessment     Nursing information obtained from:    Demographic factors:   62 aaf Current Mental Status:   see below Loss Factors:   homeless Historical Factors:   no si attempts Risk Reduction Factors:   wants to get better  CLINICAL FACTORS:   More than one psychiatric diagnosis  COGNITIVE FEATURES THAT CONTRIBUTE TO RISK:  Closed-mindedness    SUICIDE RISK:   Mild:  Suicidal ideation of limited frequency, intensity, duration, and specificity.  There are no identifiable plans, no associated intent, mild dysphoria and related symptoms, good self-control (both objective and subjective assessment), few other risk factors, and identifiable protective factors, including available and accessible social support.  PLAN OF CARE: Mental Status Examination/Evaluation:  Objective: Appearance: Disheveled   Eye Contact:: Fair   Speech: Normal Rate   Volume: Normal   Mood: Anxious, Depressed, Hopeless and Worthless   Affect: ristricted   Thought Process: Disorganized at times  Orientation: Other: Patient oriented to person and place   Thought Content: Delusions and Paranoid Ideation   Suicidal Thoughts: Yes. with intent/plan   Homicidal Thoughts: No   Memory: Immediate; Fair  Recent; Poor  Remote; Poor   Judgement: Impaired   Insight: Lacking   Psychomotor Activity: Decreased   Concentration: fair  Recall: Poor   Akathisia: No   Handed: Right   AIMS (if indicated):   Assets: Desire for Improvement  Physical Health   Sleep: Number of Hours: 6.5    Laboratory/X-Ray  Psychological Evaluation(s)   Completed in ED    Assessment: Physical assessment and Review of Systems completed in the ED without significant findings except for depression and anxiety    AXIS I: adjustment d/ with mixed emotions, hx of Generalized Anxiety Disorder and Major Depression, Recurrent severe, hx of delusional d/o AXIS II: Deferred  AXIS III:  Past Medical History     Diagnosis  Date   .  Anxiety    .  Major depressive disorder, recurrent, severe with psychotic features     AXIS IV: economic problems, housing problems, occupational problems, other psychosocial or environmental problems, problems related to social environment and problems with access to health care services  AXIS V: 21-30 behavior considerably influenced by delusions or hallucinations OR serious impairment in judgment, communication OR inability to function in almost all areas    Treatment Plan/Recommendations:   Continue current meds Will add celexa 10 mg qam for mood Tsh, rpr, lipids and a1c tomorrow Will Expand hx   Wonda Cerise 08/29/2012, 11:49 AM

## 2012-08-29 NOTE — ED Provider Notes (Signed)
Medical screening examination/treatment/procedure(s) were performed by non-physician practitioner and as supervising physician I was immediately available for consultation/collaboration.   Jarren Para L Branna Cortina, MD 08/29/12 0757 
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
## 2012-08-29 NOTE — Progress Notes (Signed)
D- [name] has been quiet and isolative to room this shift. Refused patient inventory sheet. Drowsy and difficult to arouse. Denies SI. No physical complaints. Was in dayroom for a while following luch. A- Encouraged group and peer interaction.  Medication education done. Continue POC and evaluation of treatment goals. Continue 15' checks for safety. R- Compliant with schedule medications and verbalized understanding of instruction for depakote sprinkles.  Remains safe.

## 2012-08-29 NOTE — Progress Notes (Signed)
Psychoeducational Group Note  Date:  08/29/2012 Time:  0930  Group Topic/Focus:  Spirituality:   The focus of this group is to discuss how one's spirituality can aide in recovery.  Participation Level:  Active  Participation Quality:  Appropriate and Attentive  Affect:  Appropriate  Cognitive:  Alert and Appropriate  Insight:  Good  Engagement in Group:  Good  Additional Comments:   Cresenciano Lick 08/29/2012, 11:41 AM

## 2012-08-29 NOTE — Progress Notes (Signed)
Pt in her room yelling and having loud conversation with herself so much so that she can be heard with her door closed throughout the hallway. Pt came out of her room abruptly stating everything was fine. During 1:1 with pt she tells this Clinical research associate, "I'm just frustrated. I lost my home and have nowhere to go." She denies any hallucinations however behavior suggests otherwise. Her eye contact and interaction are minimal, affect flat, preoccupied. Emotional support and reassurance provided. Medicated with scheduled Depakote sprinkles as well as a risperdal M tab which she willingly takes. On reassess pt is calmer. She denies SI/HI and remains safe. States there is no family or support on which she can rely either in person or via phone. Lawrence Marseilles

## 2012-08-29 NOTE — Progress Notes (Addendum)
Met with pt 1:1. On assessment pt is calm and presents appropriately. She is guarded and has minimal interactions with others. She reports "I still have this deep pain in my heart."  She denies SI/HI/AVH however after 1:1 time, pt observed talking to self in loud, argumentative way. She makes gestures as if someone is sitting with her. Pt offered vistaril prn but states it has no effect on her and she requests ativan. On call PA called and orders received. Pt started on risperdal 1mg  po and was also given trazadone prn for sleep per her request. On reassess pt is calmer and did eventually rest quietly after lights out. She remains safe at this time. Lawrence Marseilles

## 2012-08-29 NOTE — Progress Notes (Signed)
[facility] Group Notes:  (Counselor/Nursing/MHT/Case Management/Adjunct)  08/29/2012 12:54 PM  Type of Therapy:  Group Therapy  Participation Level:  Did Not Attend    [name] 08/29/2012, 12:54 PM

## 2012-08-29 NOTE — H&P (Signed)
  Pt was seen by me today and I agree with the key elements documented in H&P. Also see my SI assessment note for additional plan recommendations. 

## 2012-08-30 DIAGNOSIS — F22 Delusional disorders: Secondary | ICD-10-CM

## 2012-08-30 LAB — HEMOGLOBIN A1C
Hgb A1c MFr Bld: 6.6 % — ABNORMAL HIGH (ref <5.7)
Mean Plasma Glucose: 143 mg/dL — ABNORMAL HIGH (ref <117)

## 2012-08-30 LAB — LIPID PANEL
LDL Cholesterol: 141 mg/dL — ABNORMAL HIGH (ref 0–99)
Total CHOL/HDL Ratio: 4.2 RATIO
VLDL: 24 mg/dL (ref 0–40)

## 2012-08-30 LAB — TSH: TSH: 3.938 u[IU]/mL (ref 0.350–4.500)

## 2012-08-30 NOTE — Progress Notes (Signed)
D: Patient in day room on approach.  Patient does not initiate conversation with Clinical research associate.  Patient states she is concerned because she needs a job so she can pay for her housing.  Patient states she is homeless and she is her so she will be able to get housing.  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  Patient denies SI/HI and deneis AVH. A: Staff to monitor Q 15 mins for safety.  Encouragement and support offered.  Scheduled medications administered per orders.  Tylenol administered prn for painful urination. R: Patient remains safe on the unit.  Patient taking medications.  Patient attended group tonight.  Patient calm, cooperative and visible on the unit.

## 2012-08-30 NOTE — Treatment Plan (Signed)
Interdisciplinary Treatment Plan Update (Adult)  Date: 08/30/2012  Time Reviewed: 9:41 AM   Progress in Treatment: Attending groups: Yes Participating in groups: Yes Taking medication as prescribed: Yes Tolerating medication: Yes   Family/Significant other contact made: None  Patient understands diagnosis:  Yes  As evidenced by asking for help with housing, a car and a job.  Goes on to say that she was feeling suicidal and hopeless prior to coming in, but denies now.  Discussing patient identified problems/goals with staff:  Yes Medical problems stabilized or resolved:  Yes Denies suicidal/homicidal ideation: Yes  In tx team Issues/concerns per patient self-inventory:  Not filled out Other:  New problem(s) identified: N/A  Reason for Continuation of Hospitalization: Medication stabilization  Interventions implemented related to continuation of hospitalization:  Encourage group attendance and participation  Risperdal, depakote trail  Additional comments:  Estimated length of stay: 4-5 days  Discharge Plan: shelter?  New goal(s): N/A  Review of initial/current patient goals per problem list:   1.  Goal(s): Eliminate SI  Met:  Yes  Target date:11/4  As evidenced [id number] report  2.  Goal (s):Get into housing from here  Met:  No  Target date:11/7  As evidenced [id number] of bed  3.  Goal(s): Stabilize mood  Met:  No  Target date:11/7  As evidenced by: Martie Lee will rate her depression and anxiety at 3 or less   4.  Goal(s):Eliminate psychosis  Met:  No  Target date:11/7  As evidenced [id number]  Attendees: Patient: [name]  08/30/2012 9:41 AM  Family:     Physician:  Thedore Mins 08/30/2012 9:41 AM   Nursing: Joslyn Devon   08/30/2012 9:41 AM   Clinical Social Worker:  Richelle Ito 08/30/2012 9:41 AM   Extender:  Verne Spurr PA 08/30/2012 9:41 AM   Other:     Other:     Other:     Other:      Scribe for Treatment Team:   Ida Rogue, 08/30/2012 9:41 AM

## 2012-08-30 NOTE — Progress Notes (Signed)
Patient ID: [name], female   DOB: 1965/11/10, 46 y.o.   MRN: [id number] Patient attended treatment team meeting this am.  She said she was told that she could get help from [facility] with getting a job, possibly a donation of a car, and help with housing.  She was evicted from her apartment due to nonpayment.  She stated that she "was out on my own from 4 days and I gave up hope and collapsed."  Patient reports that she has worked recently; she has two master's degrees.  She reports that she has no family support.  Patient denies any SI/HI/AVH.  She has clear thought processes, however, she remains guarded with staff and peers.  She has minimal contact with staff.    Continue to monitor medication management and MD orders.  Continue 15 minute safety checks per protocol.  Collaborate with treatment team members regarding patient's POC.  Patient's behavior has been appropriate on the unit.  Encourage patient to attend groups and participate in her treatment.
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
## 2012-08-30 NOTE — Progress Notes (Signed)
Psychoeducational Group Note  Date:  08/30/2012 Time:  1100  Group Topic/Focus:  Self Care:   The focus of this group is to help patients understand the importance of self-care in order to improve or restore emotional, physical, spiritual, interpersonal, and financial health.  Participation Level:  Active  Participation Quality:  Appropriate  Affect:  Appropriate  Cognitive:  Appropriate  Insight:  Good  Engagement in Group:  Good  Additional Comments:  Pt. Participated in group and identified activities she wants to do for self-care. Pt. Also recognized she needs to put her own needs first before helping other people.  [name] [facility] 08/30/2012, 11:39 AM

## 2012-08-30 NOTE — Progress Notes (Signed)
Psychoeducational Group Note  Date:  08/30/2012 Time: 2000  Group Topic/Focus:  Wrap-Up Group:   The focus of this group is to help patients review their daily goal of treatment and discuss progress on daily workbooks.  Participation Level:  Active  Participation Quality:  Intrusive and Sharing  Affect:  Anxious  Cognitive:  Alert  Insight:  Limited  Engagement in Group:  Limited  Additional Comments:  Patient shared that she is ready to go home and that she needs to get a job but she needs a computer to look for a job but there are no computers at [facility]. Patient continued to share that she "is ready to go home" and that she wants a "home for the holidays" and a Christmas tree, too.  Aubria Vanecek, Newton Pigg 08/30/2012, 8:24 PM

## 2012-08-31 LAB — URINALYSIS, ROUTINE W REFLEX MICROSCOPIC
Bilirubin Urine: NEGATIVE
Hgb urine dipstick: NEGATIVE
Ketones, ur: NEGATIVE mg/dL
Protein, ur: NEGATIVE mg/dL
Specific Gravity, Urine: 1.027 (ref 1.005–1.030)
Urobilinogen, UA: 0.2 mg/dL (ref 0.0–1.0)

## 2012-08-31 LAB — URINE MICROSCOPIC-ADD ON

## 2012-08-31 MED ORDER — CITALOPRAM HYDROBROMIDE 20 MG PO TABS
20.0000 mg | ORAL_TABLET | Freq: Every day | ORAL | Status: DC
  Administered 2012-09-01 – 2012-09-27 (×27): 20 mg via ORAL
  Filled 2012-08-31: qty 3
  Filled 2012-08-31 (×30): qty 1

## 2012-08-31 NOTE — Progress Notes (Signed)
Patient ID: [name], female   DOB: 06/17/1966, 46 y.o.   MRN: [id number] Patient has clearer thought processes this am, however, this afternoon she became very upset and tearful.  She states that she cannot "believe that I am in this."  She met with MD and nursing today and reported that she is very "scared" due to her lack of living arrangements.  She reports that she became very anxious when she was told by the CW that she could not return to the [facility].  She reports that she is sad and rates her depression as a 10.  She denies any SI/HI/AVH.  She presents with bright affect and sometimes labile mood.  Continue to monitor medication management and MD orders.  Collaborate with treatment team regarding patient's POC.  Safety checks completed every 15 minutes per protocol.  Patient's behavior has been appropriate on the unit.  Encourage patient to attend groups and participate in her treatment.

## 2012-08-31 NOTE — Progress Notes (Signed)
Psychoeducational Group Note  Date:  08/31/2012 Time:  0930  Group Topic/Focus:  Recovery Goals:   The focus of this group is to identify appropriate goals for recovery and establish a plan to achieve them.  Participation Level:  Active  Participation Quality:  Resistant  Affect:  Flat  Cognitive:  Confused  Insight:  Good  Engagement in Group:  Good  Additional Comments:  none  Sreeja Spies M 08/31/2012, 10:56 AM

## 2012-08-31 NOTE — Progress Notes (Signed)
Patient ID: [name], female   DOB: Apr 01, 1966, 46 y.o.   MRN: [id number] Patient ID: [name], female   DOB: [birth year], 46 y.o.   MRN: [id number] [facility] MD Progress Note  08/31/2012 11:17 AM Attie Nawabi  MRN:  [id number]  Diagnosis:  Axis I: Major Depression, Recurrent severe with psychosis  Subjective: " I am anxious and scared that I might not get a place to go after my discharge"  ADL's:  Intact  Sleep: poor  Appetite:  Good  Suicidal Ideation:  Plan:  yes Intent:  denies Means:  denies Homicidal Ideation:  Plan:  denies Intent:  denies Means:  denies  Objective: Patient reports ongoing anxiety,depression, insomnia and suicidal thoughts with no plan. Patient is worried that she will not be able to go back to her previous shelter.  She continues to exhibiting paranoia and delusional thoughts as evidenced by her believe that people are against her success and she is thinks she is being monitored.  Review of Systems - General ROS: negative Psychological ROS: negative Ophthalmic ROS: negative Endocrine ROS: negative Respiratory ROS: negative Cardiovascular ROS: negative Gastrointestinal ROS: no abdominal pain, change in bowel habits, or black or bloody stools Musculoskeletal ROS: negative Neurological ROS: no TIA or stroke symptoms   Mental Status Examination/Evaluation: Objective:  Appearance: Bizarre  Eye Contact::  Good  Speech:  Clear and Coherent  Volume:  Normal  Mood:  Depressed and Hopeless  Affect:  Blunt  Thought Process:  Disorganized  Orientation:  Full  Thought Content:  Delusions  Suicidal Thoughts:  No  Homicidal Thoughts:  No  Memory:  Immediate;   Fair Recent;   Fair Remote;   Fair  Judgement:  Impaired  Insight:  Lacking  Psychomotor Activity:  Decreased  Concentration:  Fair  Recall:  Good  Akathisia:  No  Handed:  Right  AIMS (if indicated):     Assets:  Desire for Improvement  Sleep:  Number of Hours: 3.25    Vital Signs:Blood  pressure 129/83, pulse 73, temperature 96.9 F (36.1 C), temperature source Oral, resp. rate 18, height 5' 4.75" (1.645 m), weight 107.049 kg (236 lb), last menstrual period 10/29/2011. Current Medications: Current Facility-Administered Medications  Medication Dose Route Frequency Provider Last Rate Last Dose  . acetaminophen (TYLENOL) tablet 650 mg  650 mg Oral Q6H PRN Shuvon Rankin, NP   650 mg at 08/30/12 2055  . alum & mag hydroxide-simeth (MAALOX/MYLANTA) 200-200-20 MG/5ML suspension 30 mL  30 mL Oral Q4H PRN Shuvon Rankin, NP      . citalopram (CELEXA) tablet 10 mg  10 mg Oral Daily Wonda Cerise, MD   10 mg at 08/31/12 0802  . divalproex (DEPAKOTE SPRINKLE) capsule 125 mg  125 mg Oral Q12H Nanine Means, NP   125 mg at 08/31/12 0802  . hydrOXYzine (ATARAX/VISTARIL) tablet 25 mg  25 mg Oral Q6H PRN Jorje Guild, PA-C      . LORazepam (ATIVAN) tablet 2 mg  2 mg Oral Q6H PRN Jorje Guild, PA-C   2 mg at 08/29/12 0756  . magnesium hydroxide (MILK OF MAGNESIA) suspension 30 mL  30 mL Oral Daily PRN Shuvon Rankin, NP      . risperiDONE (RISPERDAL M-TABS) disintegrating tablet 1 mg  1 mg Oral Q6H PRN Nanine Means, NP   1 mg at 08/29/12 1928  . risperiDONE (RISPERDAL) tablet 1 mg  1 mg Oral QHS Jorje Guild, PA-C   1 mg at 08/30/12 2123  . traZODone (DESYREL) tablet 100 mg  100 mg Oral QHS PRN Nanine Means, NP   100 mg at 08/30/12 2352    Lab Results:  Results for orders placed during the hospital encounter of 08/28/12 (from the past 48 hour(s))  LIPID PANEL     Status: Abnormal   Collection Time   08/30/12  6:30 AM      Component Value Range Comment   Cholesterol 217 (*) 0 - 200 mg/dL    Triglycerides 161  <096 mg/dL    HDL 52  >04 mg/dL    Total CHOL/HDL Ratio 4.2      VLDL 24  0 - 40 mg/dL    LDL Cholesterol 540 (*) 0 - 99 mg/dL   HEMOGLOBIN J8J     Status: Abnormal   Collection Time   08/30/12  6:30 AM      Component Value Range Comment   Hemoglobin A1C 6.6 (*) <5.7 %    Mean Plasma Glucose 143  (*) <117 mg/dL   TSH     Status: Normal   Collection Time   08/30/12  6:30 AM      Component Value Range Comment   TSH 3.938  0.350 - 4.500 uIU/mL   URINALYSIS, ROUTINE W REFLEX MICROSCOPIC     Status: Abnormal   Collection Time   08/30/12  9:27 PM      Component Value Range Comment   Color, Urine YELLOW  YELLOW    APPearance CLOUDY (*) CLEAR    Specific Gravity, Urine 1.027  1.005 - 1.030    pH 5.5  5.0 - 8.0    Glucose, UA NEGATIVE  NEGATIVE mg/dL    Hgb urine dipstick NEGATIVE  NEGATIVE    Bilirubin Urine NEGATIVE  NEGATIVE    Ketones, ur NEGATIVE  NEGATIVE mg/dL    Protein, ur NEGATIVE  NEGATIVE mg/dL    Urobilinogen, UA 0.2  0.0 - 1.0 mg/dL    Nitrite NEGATIVE  NEGATIVE    Leukocytes, UA TRACE (*) NEGATIVE   URINE MICROSCOPIC-ADD ON     Status: Abnormal   Collection Time   08/30/12  9:27 PM      Component Value Range Comment   Squamous Epithelial / LPF MANY (*) RARE    WBC, UA 3-6  <3 WBC/hpf    Bacteria, UA MANY (*) RARE    Crystals CA OXALATE CRYSTALS (*) NEGATIVE    Urine-Other AMORPHOUS URATES/PHOSPHATES       Physical Findings: AIMS: Facial and Oral Movements Muscles of Facial Expression: None, normal Lips and Perioral Area: None, normal Jaw: None, normal Tongue: None, normal,Extremity Movements Upper (arms, wrists, hands, fingers): None, normal Lower (legs, knees, ankles, toes): None, normal, Trunk Movements Neck, shoulders, hips: None, normal, Overall Severity Severity of abnormal movements (highest score from questions above): None, normal Incapacitation due to abnormal movements: None, normal Patient's awareness of abnormal movements (rate only patient's report): No Awareness, Dental Status Current problems with teeth and/or dentures?: No Does patient usually wear dentures?: No  CIWA:  CIWA-Ar Total: 0  COWS:  COWS Total Score: 0   Treatment Plan Summary: Daily contact with patient to assess and evaluate symptoms and progress in treatment Medication  management  Plan:1. Admit for crisis management and stabilization. 2. Medication management to reduce current symptoms to base line and improve the patient's overall level of functioning: Increase Celexa to 20 mg po daily 3. Treat health problems as indicated. 4. Develop treatment plan to decrease risk of relapse upon discharge and the need for readmission. 5.  Psycho-social education regarding relapse prevention and self care. 6. Health care follow up as needed for medical problems. 7. Restart home medications where appropriate.    Thedore Mins, MD 08/31/2012, 11:30 AM

## 2012-09-01 LAB — URINE CULTURE: Colony Count: 55000

## 2012-09-01 MED ORDER — LORAZEPAM 1 MG PO TABS
1.0000 mg | ORAL_TABLET | Freq: Four times a day (QID) | ORAL | Status: DC | PRN
  Administered 2012-09-07 – 2012-09-19 (×4): 1 mg via ORAL
  Filled 2012-09-01 (×5): qty 1

## 2012-09-01 MED ORDER — RISPERIDONE 2 MG PO TABS
2.0000 mg | ORAL_TABLET | Freq: Every day | ORAL | Status: DC
  Administered 2012-09-01: 2 mg via ORAL
  Filled 2012-09-01 (×3): qty 1

## 2012-09-01 NOTE — Progress Notes (Signed)
D: Patient in dayroom on approach.  Patient animated while speaking with Clinical research associate.  Patient states hse has been depressed and angery today.  Patient states she is 46 years old and does not have a place to live.  Patient is proccupied with not having housing and not having a job.  Patient states she is disappointed because the holidays are coming up and she does not have a home to go to.    Patient states she has some mild depression.  Patient denies anxiety.  Patient denies SI/HI and denies AVH.    A: Staff to monitor Q 15 mins for safety.  Encouragement and support offered.  Scheduled medications administered per orders. R: Patient remains safe on the unit.  Patient cooperative but needed to be redirected because she got into a brief argument with another patient about the thermostat.  Patient redirectable.  Patient taking administered medications.

## 2012-09-01 NOTE — Progress Notes (Signed)
D:  Patient appears labile. Pt is very paranoid. Pt thinks someone my come and hurt her while she is sleeping.  Patient is very cooperative with coaching.  Patient is easily distracted and needs constant redirection and focus.  Pt denies SI/HI/AVH and agreed to contract for safety. Patient makes no complaints of pain at this time. Pt seems Religiously preoccupied. Pt observed praying to self in dayroom and in the hallway.    A: Safety  maintained with Q15 min  checks. Support and encouragement provided.Pt given scheduled meds.  R: Patient remains safe. She is complaint with medication. Safety has been maintained Q15 and continue current POC

## 2012-09-01 NOTE — Progress Notes (Signed)
Psychoeducational Group Note  Date:  09/01/2012 Time:  1100  Group Topic/Focus:  Personal Choices and Values:   The focus of this group is to help patients assess and explore the importance of values in their lives, how their values affect their decisions, how they express their values and what opposes their expression.  Participation Level:  Did Not Attend   Roniel Halloran Meredith 09/01/2012, 11:27 AM 

## 2012-09-01 NOTE — Progress Notes (Signed)
D: Patient denies SI/HI and auditory and visual hallucinations. The patient has a labile mood and affect. The patient can become irritable over questions that staff ask and then will later come up and joke about the incident. The patient is also having intermittent paranoia throughout the day as evidenced by patient staring at medications and drinks that staff provide. The patient is also requesting help with housing when she leaves and wants to know if "churces in the area will help her with money."  A: Patient given emotional support from RN. Patient encouraged to come to staff with concerns and/or questions. Patient's medication routine continued. Patient's orders and plan of care reviewed. Patient referred to case management for housing and monetary questions.  R: Patient remains cooperative. Will continue to monitor patient q15 minutes for safety.

## 2012-09-01 NOTE — Discharge Planning (Signed)
Pt attended morning aftercare planning group. She reported poor sleep and said that she spend much of the night walking the halls. SW and pt called the [facility] and left message to find out if she is eligible for return.  [name] called back saying that she is eligible, and to call re: openings. [name] came to PM group with [name] from [facility], but was in and out of group.  Did not participate.

## 2012-09-01 NOTE — Progress Notes (Signed)
[facility] MD Progress Note  09/01/2012 9:32 AM [name]  MRN:  [id number]  Diagnosis: Major depressive disorder, recurrent, severe with psychotic features   ADL's:  Intact  Sleep: Fair  Appetite:  Fair  Suicidal Ideation:  denies Homicidal Ideation:  denies  AEB (as evidenced by): patient's report. Subjective: [name] reports she is doing "ok" and rates her depression at a 10. She reports that she has a lot on her mind today. She talks with her eyes shut sitting up in bed. She does make eye contact with this provider and does not appear to be sleeping.  Mental Status Examination/Evaluation: Objective:  Appearance: Disheveled  Eye Contact::  Fair  Speech:  Clear and Coherent  Volume:  Normal  Mood:  Depressed  Affect:  Congruent  Thought Process:  Circumstantial  Orientation:  Full  Thought Content:  Delusions denies racing thoughts   Suicidal Thoughts:  No  Homicidal Thoughts:  No  Memory:  Immediate;   Fair Recent;   Fair Remote;   Fair  Judgement:  Impaired  Insight:  Lacking  Psychomotor Activity:  Normal  Concentration:  Fair  Recall:  Fair  Akathisia:  No  Handed:    AIMS (if indicated):     Assets:  Communication Skills Desire for Improvement Physical Health Talents/Skills Vocational/Educational  Sleep:  Number of Hours: 0.25    Vital Signs:Blood pressure 134/94, pulse 77, temperature 96.9 F (36.1 C), temperature source Oral, resp. rate 18, height 5' 4.75" (1.645 m), weight 107.049 kg (236 lb), last menstrual period 10/29/2011.  Objective: [name] is still focused on her immediate needs for housing, employment and transportation. She states her anxiety and depression are worse since we are not providing her with these things. She shows some limited insight in asking about local churches that could provide her with some financial assistance, housing, transportation, and employment. Current Medications: Current Facility-Administered Medications  Medication  Dose Route Frequency Provider Last Rate Last Dose  . acetaminophen (TYLENOL) tablet 650 mg  650 mg Oral Q6H PRN Shuvon Rankin, NP   650 mg at 08/30/12 2055  . alum & mag hydroxide-simeth (MAALOX/MYLANTA) 200-200-20 MG/5ML suspension 30 mL  30 mL Oral Q4H PRN Shuvon Rankin, NP      . citalopram (CELEXA) tablet 20 mg  20 mg Oral Daily Mojeed Akintayo   20 mg at 09/01/12 0801  . divalproex (DEPAKOTE SPRINKLE) capsule 125 mg  125 mg Oral Q12H Nanine Means, NP   125 mg at 09/01/12 0804  . hydrOXYzine (ATARAX/VISTARIL) tablet 25 mg  25 mg Oral Q6H PRN Jorje Guild, PA-C      . LORazepam (ATIVAN) tablet 2 mg  2 mg Oral Q6H PRN Jorje Guild, PA-C   2 mg at 08/29/12 0756  . magnesium hydroxide (MILK OF MAGNESIA) suspension 30 mL  30 mL Oral Daily PRN Shuvon Rankin, NP      . risperiDONE (RISPERDAL M-TABS) disintegrating tablet 1 mg  1 mg Oral Q6H PRN Nanine Means, NP   1 mg at 08/29/12 1928  . risperiDONE (RISPERDAL) tablet 1 mg  1 mg Oral QHS Jorje Guild, PA-C   1 mg at 08/31/12 2209  . traZODone (DESYREL) tablet 100 mg  100 mg Oral QHS PRN Nanine Means, NP   100 mg at 08/30/12 2352  . [DISCONTINUED] citalopram (CELEXA) tablet 10 mg  10 mg Oral Daily Wonda Cerise, MD   10 mg at 08/31/12 0802    Lab Results:  Results for orders placed during the hospital encounter  of 08/28/12 (from the past 48 hour(s))  URINALYSIS, ROUTINE W REFLEX MICROSCOPIC     Status: Abnormal   Collection Time   08/30/12  9:27 PM      Component Value Range Comment   Color, Urine YELLOW  YELLOW    APPearance CLOUDY (*) CLEAR    Specific Gravity, Urine 1.027  1.005 - 1.030    pH 5.5  5.0 - 8.0    Glucose, UA NEGATIVE  NEGATIVE mg/dL    Hgb urine dipstick NEGATIVE  NEGATIVE    Bilirubin Urine NEGATIVE  NEGATIVE    Ketones, ur NEGATIVE  NEGATIVE mg/dL    Protein, ur NEGATIVE  NEGATIVE mg/dL    Urobilinogen, UA 0.2  0.0 - 1.0 mg/dL    Nitrite NEGATIVE  NEGATIVE    Leukocytes, UA TRACE (*) NEGATIVE   URINE MICROSCOPIC-ADD ON     Status:  Abnormal   Collection Time   08/30/12  9:27 PM      Component Value Range Comment   Squamous Epithelial / LPF MANY (*) RARE    WBC, UA 3-6  <3 WBC/hpf    Bacteria, UA MANY (*) RARE    Crystals CA OXALATE CRYSTALS (*) NEGATIVE    Urine-Other AMORPHOUS URATES/PHOSPHATES       Physical Findings: AIMS: Facial and Oral Movements Muscles of Facial Expression: None, normal Lips and Perioral Area: None, normal Jaw: None, normal Tongue: None, normal,Extremity Movements Upper (arms, wrists, hands, fingers): None, normal Lower (legs, knees, ankles, toes): None, normal, Trunk Movements Neck, shoulders, hips: None, normal, Overall Severity Severity of abnormal movements (highest score from questions above): None, normal Incapacitation due to abnormal movements: None, normal Patient's awareness of abnormal movements (rate only patient's report): No Awareness, Dental Status Current problems with teeth and/or dentures?: No Does patient usually wear dentures?: No  CIWA:  CIWA-Ar Total: 0  COWS:  COWS Total Score: 0   Treatment Plan Summary:  1. Daily contact with patient to assess and evaluate symptoms and progress in treatment.  2. Medication management  3. The patient will deny suicidal ideations or homicidal ideations for 48 hours prior to discharge and have a depression and anxiety rating of 3 or less.The patient will also deny any auditory or visual hallucinations or delusional thinking.  4. The patient will deny any symptoms of substance withdrawal at time of discharge.  Plan: 1. Continue the Celexa 20mg  po qd. 2. Continue the Depakote 125 mg po q 12 hours for mood stabilization. 3.Increased the Risperdal to 2mg  po at hs for delusions. 4. Decreased the prn Ativan from 2mg  to 1mg  as the patient is cooperative and starting to stabilize. 5. Will follow and current LOS is 2-3 days.  6. [name] is encouraged to speak with her CM about these concerns and hopefully he can provide her with more  information. 7. The idea of a injectable medication was introduced to [name] for her consideration, presented as "one less thing she has to remember to do on a daily basis".  She is asked to give this some thought regarding the convenience of 1 a month dosing vs. Daily dosing. [name] 09/01/2012, 9:32 AM

## 2012-09-02 NOTE — Progress Notes (Signed)
Patient ID: [name], female   DOB: 23-Apr-1966, 46 y.o.   MRN: [id number] [facility] MD Progress Note  09/02/2012 11:17 AM [name]  MRN:  [id number]  Diagnosis: Major depressive disorder, recurrent, severe with psychotic features   ADL's:  Intact  Sleep: Fair  Appetite:  Fair  Suicidal Ideation:  denies Homicidal Ideation:  denies  AEB (as evidenced by): patient's report. Subjective: [name] reports she is still depressed and overwhelmed. She remains delusional and disorganized in thinking. She is worry about getting a place to live and a new job. She reports that she has a lot on her mind today. She thinks the police are looking for her because she had wandered  into an hotel before coming to the hospital after she was evicted from her residence. Now she thinks they were going to charge her for trespassing.  Mental Status Examination/Evaluation: Objective:  Appearance: Disheveled  Eye Contact::  Fair  Speech:  Clear and Coherent  Volume:  Normal  Mood:  Depressed  Affect:  Congruent  Thought Process:  Circumstantial  Orientation:  Full  Thought Content:  Delusions denies racing thoughts   Suicidal Thoughts:  No  Homicidal Thoughts:  No  Memory:  Immediate;   Fair Recent;   Fair Remote;   Fair  Judgement:  Impaired  Insight:  Lacking  Psychomotor Activity:  Normal  Concentration:  Fair  Recall:  Fair  Akathisia:  No  Handed:    AIMS (if indicated):     Assets:  Communication Skills Desire for Improvement Physical Health Talents/Skills Vocational/Educational  Sleep:  Number of Hours: 0.25    Vital Signs:Blood pressure 134/94, pulse 77, temperature 96.9 F (36.1 C), temperature source Oral, resp. rate 18, height 5' 4.75" (1.645 m), weight 107.049 kg (236 lb), last menstrual period 10/29/2011.  Objective: [name] is still focused on her immediate needs for housing, employment and transportation. She states her anxiety and depression are worse since we are not  providing her with these things. She shows some limited insight in asking about local churches that could provide her with some financial assistance, housing, transportation, and employment. Current Medications: Current Facility-Administered Medications  Medication Dose Route Frequency Provider Last Rate Last Dose  . acetaminophen (TYLENOL) tablet 650 mg  650 mg Oral Q6H PRN Shuvon Rankin, NP   650 mg at 08/30/12 2055  . alum & mag hydroxide-simeth (MAALOX/MYLANTA) 200-200-20 MG/5ML suspension 30 mL  30 mL Oral Q4H PRN Shuvon Rankin, NP      . citalopram (CELEXA) tablet 20 mg  20 mg Oral Daily Jarius Dieudonne   20 mg at 09/01/12 0801  . divalproex (DEPAKOTE SPRINKLE) capsule 125 mg  125 mg Oral Q12H Nanine Means, NP   125 mg at 09/01/12 0804  . hydrOXYzine (ATARAX/VISTARIL) tablet 25 mg  25 mg Oral Q6H PRN Jorje Guild, PA-C      . LORazepam (ATIVAN) tablet 2 mg  2 mg Oral Q6H PRN Jorje Guild, PA-C   2 mg at 08/29/12 0756  . magnesium hydroxide (MILK OF MAGNESIA) suspension 30 mL  30 mL Oral Daily PRN Shuvon Rankin, NP      . risperiDONE (RISPERDAL M-TABS) disintegrating tablet 1 mg  1 mg Oral Q6H PRN Nanine Means, NP   1 mg at 08/29/12 1928  . risperiDONE (RISPERDAL) tablet 1 mg  1 mg Oral QHS Jorje Guild, PA-C   1 mg at 08/31/12 2209  . traZODone (DESYREL) tablet 100 mg  100 mg Oral QHS PRN Nanine Means, NP  100 mg at 08/30/12 2352  . [DISCONTINUED] citalopram (CELEXA) tablet 10 mg  10 mg Oral Daily Wonda Cerise, MD   10 mg at 08/31/12 2130    Lab Results:  Results for orders placed during the hospital encounter of 08/28/12 (from the past 48 hour(s))  URINALYSIS, ROUTINE W REFLEX MICROSCOPIC     Status: Abnormal   Collection Time   08/30/12  9:27 PM      Component Value Range Comment   Color, Urine YELLOW  YELLOW    APPearance CLOUDY (*) CLEAR    Specific Gravity, Urine 1.027  1.005 - 1.030    pH 5.5  5.0 - 8.0    Glucose, UA NEGATIVE  NEGATIVE mg/dL    Hgb urine dipstick NEGATIVE  NEGATIVE     Bilirubin Urine NEGATIVE  NEGATIVE    Ketones, ur NEGATIVE  NEGATIVE mg/dL    Protein, ur NEGATIVE  NEGATIVE mg/dL    Urobilinogen, UA 0.2  0.0 - 1.0 mg/dL    Nitrite NEGATIVE  NEGATIVE    Leukocytes, UA TRACE (*) NEGATIVE   URINE MICROSCOPIC-ADD ON     Status: Abnormal   Collection Time   08/30/12  9:27 PM      Component Value Range Comment   Squamous Epithelial / LPF MANY (*) RARE    WBC, UA 3-6  <3 WBC/hpf    Bacteria, UA MANY (*) RARE    Crystals CA OXALATE CRYSTALS (*) NEGATIVE    Urine-Other AMORPHOUS URATES/PHOSPHATES       Physical Findings: AIMS: Facial and Oral Movements Muscles of Facial Expression: None, normal Lips and Perioral Area: None, normal Jaw: None, normal Tongue: None, normal,Extremity Movements Upper (arms, wrists, hands, fingers): None, normal Lower (legs, knees, ankles, toes): None, normal, Trunk Movements Neck, shoulders, hips: None, normal, Overall Severity Severity of abnormal movements (highest score from questions above): None, normal Incapacitation due to abnormal movements: None, normal Patient's awareness of abnormal movements (rate only patient's report): No Awareness, Dental Status Current problems with teeth and/or dentures?: No Does patient usually wear dentures?: No  CIWA:  CIWA-Ar Total: 0  COWS:  COWS Total Score: 0   Treatment Plan Summary:  1. Daily contact with patient to assess and evaluate symptoms and progress in treatment.  2. Medication management  3. The patient will deny suicidal ideations or homicidal ideations for 48 hours prior to discharge and have a depression and anxiety rating of 3 or less.The patient will also deny any auditory or visual hallucinations or delusional thinking.  4. The patient will deny any symptoms of substance withdrawal at time of discharge.  Plan: 1. I will increase  Celexa 40mg  po daily 2. Continue the Depakote 125 mg po q 12 hours for mood stabilization. 3. Continue  Risperdal  2mg  po at hs for  delusions. 4. Continue  Ativan  1mg  as needed for anxiety as the patient is cooperative and starting to stabilize. 5. Will follow and current LOS is 2-3 days.  6. [name] is encouraged to speak with her CM about these concerns and hopefully he can provide her with more information. 7. The idea of a injectable medication was introduced to [place] for her consideration, presented as "one less thing she has to remember to do on a daily basis".  She is asked to give this some thought regarding the convenience of 1 a month dosing vs. Daily dosing.   Thedore Mins, MD 09/02/2012 11:17 AM

## 2012-09-02 NOTE — Discharge Planning (Signed)
Pt attended morning aftercare planning group. SW informed pt that he heard from [place] at the [facility] who confirmed that she is eligible for return when there is an opening available. Pt reports that she does not feel well enough to d/c today and has been experiencing mood swings partly due to housing and job uncertainties. Pt asked to speak with SW privately after group.  After group, Bernisha described paranoid thoughts, SI, and an inability to sequentially plan steps for self care.  Dr to adjust medications.  Marty did not attend PM group.

## 2012-09-02 NOTE — Treatment Plan (Signed)
Interdisciplinary Treatment Plan Update (Adult)  Date: 09/02/2012  Time Reviewed:11:55 AM  Progress in Treatment:  Attending groups: Yes  Participating in groups: Yes  Taking medication as prescribed: Yes  Tolerating medication: Yes  Family/Significant other contact made: No. Patient understands diagnosis: Yes Discussing patient identified problems/goals with staff: Yes Medical problems stabilized or resolved: Yes  Denies suicidal/homicidal ideation: No  C/O SI today Issues/concerns per patient self-inventory: Yes  SI Other:  New problem(s) identified: N/A  Reason for Continuation of Hospitalization:  Depression  Medication stabilization  SI Psychosis Interventions implemented related to continuation of hospitalization: medication stabilization, therapeutic milieu, encourage group attendance and participation, safety checks q 15 min. Additional comments:  Estimated length of stay:3-5 days  Discharge Plan: Patient will f/u at [facility] for medication management and will call [facility] daily to check for open bed. New goal(s): N/A  Review of initial/current patient goals per problem list:  1. Goal(s):Eliminate SI  Met: No Target date: 11/11 As evidenced AV:WUJW report in tx team.  At the last review, [name] was denying SI.  Today she states it has returned due to overwhelming emotions related to some paranoid thinking 2. Goal (s): Stabilize mood  Met: No  Target date:by d/c As evidenced by: Pt will rate her depression at a 4 or less 3. Goal(s): Eliminate psychosis Met: No Target date: 11/11 As evidenced by self report in tx team/observation 4. Goal(s): Identify safe living placement and mental wellness plan.            Met: Yes            Target date: 11/7             As evidenced by: Pt will stay at [facility] and will f/u at [facility] for medication management. Attendees:    Patient: [name] 09/02/2012 11:55 AM   Family:     Physician: Thedore Mins, MD 09/02/2012   11:55 AM   Nursing: Nestor Ramp RN 11/7/201311:55 AM   Social Worker: Richelle Ito, LCSW  09/02/2012 11:55 AM    11/7/201311:55 AM    Other: Herbert Seta Smart 09/02/2012 11:55AM   Other:     Other:     Other:     Scribe for Treatment Team: Trula Slade, MSW Intern, 11/7/201311:55 AM

## 2012-09-02 NOTE — Progress Notes (Signed)
Adult Psychosocial Assessment Update Interdisciplinary Team  Previous [facility] admissions/discharges:  Admissions   Date: 08/28/2012 Date:  Date: 04/15/2012 Date:  Date: 12/16/2010 Date:  Date: 11/20/2010 Date:  Date: Date:   Changes since the last Psychosocial Assessment (including adherence to outpatient mental health and/or substance abuse treatment, situational issues contributing to decompensation and/or relapse). Patient had been living at [facility] but had been behaving in increasingly bizarre ways--beating on walls, laying in street, cursing at others, and expressing extreme paranoia. Patient was unable to pay rent as well. Patient was asked to leave. Patient in need of job, car, and housing at this point.             Discharge Plan 1. Will you be returning to the same living situation after discharge?   Yes: No:      If no, what is your plan?    Patient is eligible for return to the [facility] and is waiting for bed availability.       2. Would you like a referral for services when you are discharged? Yes:     If yes, for what services?  No:       Patient will be following up at [facility].       Summary and Recommendations (to be completed by the evaluator) Patient is 46 year old [place] American female with major depressive disorder, with severe psychotic symptoms and delusional disorder living in [place], [place]. Medication management, therapeutic milieu, encourage group participation and attendance, safety checks q 15 min during hospital stay. Patient will be calling [facility] daily in order to check for bed availability and will f/u at [facility] for medication management.                       Signature:  Smart, Ledell Peoples, 09/02/2012 11:06 AM

## 2012-09-02 NOTE — Progress Notes (Signed)
Patient ID: [name], female   DOB: August 26, 1966, 46 y.o.   MRN: [id number]  D:  Patient appears sad and depressed. Pt very labile.  Patient is very superficial when talking to nurse.  Pt denies SI/HI/AVH and agreed to contract for safety. Pt actively observed having conversations in the dayroom with some one NOT there. Pt. Observed arguing with someone in her room when no one was in there.  Patient makes no complaints of pain at this time. Pt complains of not being able to sleep. Pt refused risperdal. Pt attended karaoke, but was asked to return to the hall due to talking to herself and disrupting fellow patients.    A: Safety  maintained with Q15 min  checks. Support and encouragement provided. Pt given Trazadone PRN at 2132 and 2342 per order. Pt encouraged to take medications.   R: Patient remains safe. Pt is non-complaint with ordered risperdal  during this shift. Safety has been maintained Q15 and continue current POC.

## 2012-09-02 NOTE — Progress Notes (Signed)
Patient ID: [name], female   DOB: Mar 19, 1966, 46 y.o.   MRN: [id number] Patient remains depressed over her housing situation which has not been procured for her as of yet.  She feels hopeless over her situation, but denies any SI/HI/AVH.  She appears pleasant and cooperative this am.  She is compliant with her medications.  She has been attending groups and participating in her treatment.  Continue to monitor medication management and MD orders.  Collaborate with treatment team members regarding patient's POC.  Continue with 15 minute safety checks per protocol.  Patient's behavior has been appropriate on the unit.

## 2012-09-03 MED ORDER — LISINOPRIL 5 MG PO TABS
5.0000 mg | ORAL_TABLET | Freq: Every day | ORAL | Status: DC
  Administered 2012-09-03 – 2012-09-27 (×24): 5 mg via ORAL
  Filled 2012-09-03 (×14): qty 1
  Filled 2012-09-03: qty 3
  Filled 2012-09-03 (×12): qty 1

## 2012-09-03 MED ORDER — DIVALPROEX SODIUM 125 MG PO CPSP
250.0000 mg | ORAL_CAPSULE | Freq: Two times a day (BID) | ORAL | Status: DC
  Administered 2012-09-03 – 2012-09-05 (×4): 250 mg via ORAL
  Filled 2012-09-03 (×8): qty 2

## 2012-09-03 MED ORDER — RISPERIDONE 3 MG PO TABS
3.0000 mg | ORAL_TABLET | Freq: Every day | ORAL | Status: DC
  Administered 2012-09-03 – 2012-09-05 (×3): 3 mg via ORAL
  Filled 2012-09-03 (×5): qty 1

## 2012-09-03 NOTE — Progress Notes (Signed)
Psychoeducational Group Note  Date:  09/03/2012 Time:  1315  Group Topic/Focus:  Relapse Prevention Planning:   The focus of this group is to define relapse and discuss the need for planning to combat relapse.  Participation Level:  Minimal  Participation Quality:  Redirectable  Affect:  Flat  Cognitive:  Appropriate  Insight:  Good  Engagement in Group:  Good  Additional Comments:  none  [name] M 09/03/2012, 1:55 PM

## 2012-09-03 NOTE — Progress Notes (Signed)
Psychoeducational Group Note  Date:  09/03/2012 Time:  2000  Group Topic/Focus:  Wrap-Up Group:   The focus of this group is to help patients review their daily goal of treatment and discuss progress on daily workbooks.  Participation Level:  Did Not Attend  Participation Quality:    Affect:    Cognitive:    Insight:    Engagement in Group:    Additional Comments:  Pt didn't attend wrap-up group this evening.   Kern Gingras A 09/03/2012, 9:24 PM

## 2012-09-03 NOTE — Discharge Planning (Signed)
[name] attended AM group.  She was dressed in street clothes today-usually has on a gown.  Stated she feels about the same as yesterday, and is not yet feeling well enough to go to shelter, so will likely not call today. [name] started out in PM group on relapse prevention, but quickly dismissed herself staitng that she did not feel well enough to attend.

## 2012-09-03 NOTE — Progress Notes (Addendum)
D: Patient denies HI and auditory and visual hallucinations but reports having passive SI. The patient verbally contracted for safety. The patient has a labile mood and affect. The patient refused to fill out her self-inventory sheet. The patient isolates throughout the day and appears guarded and cautious in her interactions with staff. The patient is exhibiting some religious delusions and can be heard occassionally praying and shouting throughout the day. The patient saw a Child psychotherapist who she recognized from a former job and became very irritable and started shouting racial epithets. The patient also began isolating toward the end of the day and could be heard intermittently screaming curse words from her room.  A: Patient given emotional support from RN. Patient encouraged to come to staff with concerns and/or questions. Patient's medication routine continued. Patient's orders and plan of care reviewed. The patient was given emotional support regarding her incident.  R: Patient remains cooperative. Will continue to monitor patient q15 minutes for safety.

## 2012-09-03 NOTE — Progress Notes (Signed)
[facility] MD Progress Note  09/03/2012 1:06 PM [name]  MRN:  [id number]  Diagnosis:  Major depressive disorder, recurrent, severe with psychotic features  ADL's:  Impaired  Sleep: Fair  4.5 HRS  Appetite:  Good  Suicidal Ideation:  denies Homicidal Ideation:  denies  Subjective:  [name] was up and active in the unit milieu today. There were Social Workers visiting on the unit from a community agency.  [name] happened to recognize one of them and proceeded to berate and curse them.  The Social worker did admit that she knew [name] from another job. Mental Status Examination/Evaluation: Objective:  Appearance: Disheveled  Eye Contact::  Poor  Speech:  Pressured  Volume:  Increased  Mood:  Angry  Affect:  Inappropriate  Thought Process:  Disorganized  Orientation:  Other:    Thought Content:  Delusions [name] acted as if the visitor had persecuted her in [place].  Suicidal Thoughts:  No  Homicidal Thoughts:  Yes, [name] verbally threatened the social worker  Memory:  Immediate;   Fair Recent;   Fair Remote;   Fair  Judgement:  Impaired  Insight:  Lacking  Psychomotor Activity:  Normal  Concentration:  Poor  Recall:  Poor  Akathisia:  No  Handed:    AIMS (if indicated):     Assets:  Desire for Improvement  Sleep:  Number of Hours: 4.5    Vital Signs:Blood pressure 147/99, pulse 87, temperature 97.6 F (36.4 C), temperature source Oral, resp. rate 18, height 5' 4.75" (1.645 m), weight 107.049 kg (236 lb), last menstrual period 10/29/2011.  Objective: [name] continues to be delusional with feelings of persecution. She was inappropriate and hostile toward the visitors on the unit. She also verbally threatened them.  Current Medications: Current Facility-Administered Medications  Medication Dose Route Frequency Provider Last Rate Last Dose  . acetaminophen (TYLENOL) tablet 650 mg  650 mg Oral Q6H PRN Shuvon Rankin, NP   650 mg at 08/30/12 2055  . alum & mag  hydroxide-simeth (MAALOX/MYLANTA) 200-200-20 MG/5ML suspension 30 mL  30 mL Oral Q4H PRN Shuvon Rankin, NP      . citalopram (CELEXA) tablet 20 mg  20 mg Oral Daily Mojeed Akintayo   20 mg at 09/03/12 0752  . divalproex (DEPAKOTE SPRINKLE) capsule 250 mg  250 mg Oral Q12H Verne Spurr, PA-C      . hydrOXYzine (ATARAX/VISTARIL) tablet 25 mg  25 mg Oral Q6H PRN Jorje Guild, PA-C      . lisinopril (PRINIVIL,ZESTRIL) tablet 5 mg  5 mg Oral Daily Verne Spurr, PA-C      . LORazepam (ATIVAN) tablet 1 mg  1 mg Oral Q6H PRN Verne Spurr, PA-C      . magnesium hydroxide (MILK OF MAGNESIA) suspension 30 mL  30 mL Oral Daily PRN Shuvon Rankin, NP      . risperiDONE (RISPERDAL M-TABS) disintegrating tablet 1 mg  1 mg Oral Q6H PRN Nanine Means, NP   1 mg at 08/29/12 1928  . risperiDONE (RISPERDAL) tablet 3 mg  3 mg Oral QHS Verne Spurr, PA-C      . traZODone (DESYREL) tablet 100 mg  100 mg Oral QHS PRN Nanine Means, NP   100 mg at 09/02/12 2342  . [DISCONTINUED] divalproex (DEPAKOTE SPRINKLE) capsule 125 mg  125 mg Oral Q12H Nanine Means, NP   125 mg at 09/03/12 0752  . [DISCONTINUED] risperiDONE (RISPERDAL) tablet 2 mg  2 mg Oral QHS Verne Spurr, PA-C   2 mg at 09/01/12 2103  Lab Results: none reported for the last 24 hours.  Physical Findings: AIMS: Facial and Oral Movements Muscles of Facial Expression: None, normal Lips and Perioral Area: None, normal Jaw: None, normal Tongue: None, normal,Extremity Movements Upper (arms, wrists, hands, fingers): None, normal Lower (legs, knees, ankles, toes): None, normal, Trunk Movements Neck, shoulders, hips: None, normal, Overall Severity Severity of abnormal movements (highest score from questions above): None, normal Incapacitation due to abnormal movements: None, normal Patient's awareness of abnormal movements (rate only patient's report): No Awareness, Dental Status Current problems with teeth and/or dentures?: No Does patient usually wear  dentures?: No  CIWA:  CIWA-Ar Total: 0  COWS:  COWS Total Score: 0   Treatment Plan Summary: Daily contact with patient to assess and evaluate symptoms and progress in treatment Medication management  Plan: 1. Increase depakote to 250mg  BID. 2. Increase Risperdal to 3mg  po at hs. 3. Will also add Lisinopril 5 po q AM for hypertension. 4. Will continue to follow.  Rona Ravens. Suan Pyeatt PAC 09/03/2012, 1:06 PM

## 2012-09-04 NOTE — Progress Notes (Signed)
[facility] MD Progress Note  09/04/2012 11:31 AM [name]  MRN:  [id number]  Diagnosis:   Axis I: Major Depression, Recurrent severe and Psychotic Disorder NOS Axis II: Deferred Axis III:  Past Medical History  Diagnosis Date  . Anxiety   . Major depressive disorder, recurrent, severe with psychotic features    Axis IV: economic problems, housing problems, occupational problems, other psychosocial or environmental problems, problems related to social environment and problems with primary support group Axis V: 21-30 behavior considerably influenced by delusions or hallucinations OR serious impairment in judgment, communication OR inability to function in almost all areas  ADL's:  Impaired  Sleep: Poor  Appetite:  Fair  Suicidal Ideation:  Passive Homicidal Ideation:  Denies  Mental Status Examination/Evaluation: Objective:  Appearance: Disheveled  Eye Contact::  Minimal  Speech:  Normal Rate  Volume:  Increased  Mood:  Irritable  Affect:  Depressed and Labile  Thought Process:  Tangential  Orientation:  Full  Thought Content:  Delusions and Hallucinations: Auditory  Suicidal Thoughts:  Passive  Homicidal Thoughts:  No  Memory:  Immediate;   Fair Recent;   Poor Remote;   Poor  Judgement:  Impaired  Insight:  Lacking  Psychomotor Activity:  Decreased  Concentration:  Poor  Recall:  Poor  Akathisia:  No  Handed:  Right  AIMS (if indicated):     Assets:  Communication Skills Desire for Improvement Resilience  Sleep:  Number of Hours: 0    Vital Signs:Blood pressure 131/88, pulse 91, temperature 98.2 F (36.8 C), temperature source Oral, resp. rate 18, height 5' 4.75" (1.645 m), weight 107.049 kg (236 lb), last menstrual period 10/29/2011. Current Medications: Current Facility-Administered Medications  Medication Dose Route Frequency Provider Last Rate Last Dose  . acetaminophen (TYLENOL) tablet 650 mg  650 mg Oral Q6H PRN Shuvon Rankin, NP   650 mg at 08/30/12  2055  . alum & mag hydroxide-simeth (MAALOX/MYLANTA) 200-200-20 MG/5ML suspension 30 mL  30 mL Oral Q4H PRN Shuvon Rankin, NP      . citalopram (CELEXA) tablet 20 mg  20 mg Oral Daily Mojeed Akintayo   20 mg at 09/04/12 0836  . divalproex (DEPAKOTE SPRINKLE) capsule 250 mg  250 mg Oral Q12H Verne Spurr, PA-C   250 mg at 09/04/12 2130  . hydrOXYzine (ATARAX/VISTARIL) tablet 25 mg  25 mg Oral Q6H PRN Jorje Guild, PA-C      . lisinopril (PRINIVIL,ZESTRIL) tablet 5 mg  5 mg Oral Daily Verne Spurr, PA-C   5 mg at 09/04/12 0836  . LORazepam (ATIVAN) tablet 1 mg  1 mg Oral Q6H PRN Verne Spurr, PA-C      . magnesium hydroxide (MILK OF MAGNESIA) suspension 30 mL  30 mL Oral Daily PRN Shuvon Rankin, NP      . risperiDONE (RISPERDAL M-TABS) disintegrating tablet 1 mg  1 mg Oral Q6H PRN Nanine Means, NP   1 mg at 08/29/12 1928  . risperiDONE (RISPERDAL) tablet 3 mg  3 mg Oral QHS Verne Spurr, PA-C   3 mg at 09/03/12 2240  . traZODone (DESYREL) tablet 100 mg  100 mg Oral QHS PRN Nanine Means, NP   100 mg at 09/03/12 2241  . [DISCONTINUED] divalproex (DEPAKOTE SPRINKLE) capsule 125 mg  125 mg Oral Q12H Nanine Means, NP   125 mg at 09/03/12 0752  . [DISCONTINUED] risperiDONE (RISPERDAL) tablet 2 mg  2 mg Oral QHS Verne Spurr, PA-C   2 mg at 09/01/12 2103    Lab Results:  No results found for this or any previous visit (from the past 48 hour(s)).  Physical Findings: AIMS: Facial and Oral Movements Muscles of Facial Expression: None, normal Lips and Perioral Area: None, normal Jaw: None, normal Tongue: None, normal,Extremity Movements Upper (arms, wrists, hands, fingers): None, normal Lower (legs, knees, ankles, toes): None, normal, Trunk Movements Neck, shoulders, hips: None, normal, Overall Severity Severity of abnormal movements (highest score from questions above): None, normal Incapacitation due to abnormal movements: None, normal Patient's awareness of abnormal movements (rate only patient's  report): No Awareness, Dental Status Current problems with teeth and/or dentures?: No Does patient usually wear dentures?: No  CIWA:  CIWA-Ar Total: 0  COWS:  COWS Total Score: 0   Treatment Plan Summary: Daily contact with patient to assess and evaluate symptoms and progress in treatment Medication management  Plan:  Individual and group therapy, medication management, therapy to management anger-impatience-worry, patient met with one-one but irritable and did not want to talk except she repetively said she was going to find a church to get cash assistance and "pull herself up by her boot straps", monitoring continues.  Nanine Means, NP 09/04/2012, 11:31 AM

## 2012-09-04 NOTE — Progress Notes (Signed)
[facility] Group Notes:  (Counselor/Nursing/MHT/Case Management/Adjunct)  09/04/2012 12:52 PM  Type of Therapy:  Group Therapy  Participation Level:  Minimal  Participation Quality:  Inattentive  Affect:  Depressed  Cognitive:  Oriented  Insight:  Limited  Engagement in Group:  Limited  Engagement in Therapy:  Limited  Modes of Intervention:  Socialization  Summary of Progress/Problems: The patient did not participate within the group process and declined to ask the topic question.   Estevan Ryder Renee 09/04/2012, 12:52 PM

## 2012-09-04 NOTE — Progress Notes (Signed)
Psychoeducational Group Note  Date:  09/04/2012 Time:  0900  Group Topic/Focus:  Self Inventory  Participation Level:  Active  Participation Quality:  Appropriate and Sharing  Affect:  Anxious and Depressed  Cognitive:  Appropriate  Insight:  Good  Engagement in Group:  Good  Additional Comments:    Marquerite Forsman Shari Prows 09/04/2012, 9:46 AM

## 2012-09-04 NOTE — Progress Notes (Signed)
Psychoeducational Group Note  Date:  09/04/2012 Time:  1515  Group Topic/Focus:  Self Care:   The focus of this group is to help patients understand the importance of self-care in order to improve or restore emotional, physical, spiritual, interpersonal, and financial health.  Participation Level:  Minimal  Participation Quality:  Inattentive  Affect:  Labile  Cognitive:  Confused and Delusional  Insight:  None  Engagement in Group:  Limited  Additional Comments:  Pt was in group but was not able to process.  Marquis Lunch, Temica Righetti 09/04/2012, 7:08 PM

## 2012-09-04 NOTE — Progress Notes (Signed)
D   Pt is guarded and somewhat irritable   Her speech is pressured   She has not endorsed any psychotic symptoms but does appear to be responding to internal stimuli and intrusive thoughts   She did attend group and has been socializing with others in the dayroom  She has limited insight  A   Verbal support given  Medications administered and effectiveness monitored  Q 15 min checks   Encourage  Group participation R   Pt safe at present

## 2012-09-04 NOTE — Progress Notes (Signed)
Psychoeducational Group Note  Date:  09/04/2012 Time:  0945 am  Group Topic/Focus:  Identifying Needs:   The focus of this group is to help patients identify their personal needs that have been historically problematic and identify healthy behaviors to address their needs.  Participation Level:  Minimal  Participation Quality:  Appropriate  Affect:  Appropriate  Cognitive:  Appropriate  Insight:  Limited  Engagement in Group:  Limited  Additional Comments:    Andrena Mews 09/04/2012,11:56 AM

## 2012-09-04 NOTE — Progress Notes (Signed)
Psychoeducational Group Note  Date:  09/04/2012 Time:  2000  Group Topic/Focus:  Wrap-Up Group:   The focus of this group is to help patients review their daily goal of treatment and discuss progress on daily workbooks.   Participation Level:  Active  Participation Quality:  Appropriate  Affect:  Appropriate  Cognitive:  Appropriate  Insight:  Good  Engagement in Group:  Good  Additional Comments:  Patient attended and participated in group tonight. She reported not remembering  Anything from her groups today. She advise that she is not coping at this time, however, she received the hand out from group and she will read it to see what she can use to help her with her current situation.  Lita Mains [facility] 09/04/2012, 9:34 PM

## 2012-09-04 NOTE — Progress Notes (Signed)
Patient ID: [name], female   DOB: 1965-12-16, 46 y.o.   MRN: [id number] D)  Was out in the dayroom at intervals this evening, but somewhat isolative and went back to her room, then needed encouragement to come out for her meds.  Began asking about leaving tonight as she stated she is an important witness in a serious case, initially talked about how important it was for her to leave and go to a hotel where she would be kept, and brought out to the court room when the trial started, but didn't want to talk about it as it could affect the outcome.   Took her meds with tiny sips of water, then said she had swallowed them, but as she was saying it, the pill nearly fell out of her mouth.  She took it out and thought about whether she needed it, but agreed to take it, and got it down with many more tiny sips.  Went back to her room and got ready for bed.

## 2012-09-05 DIAGNOSIS — F333 Major depressive disorder, recurrent, severe with psychotic symptoms: Principal | ICD-10-CM

## 2012-09-05 LAB — GLUCOSE, CAPILLARY: Glucose-Capillary: 120 mg/dL — ABNORMAL HIGH (ref 70–99)

## 2012-09-05 MED ORDER — BENZTROPINE MESYLATE 0.5 MG PO TABS
0.5000 mg | ORAL_TABLET | Freq: Two times a day (BID) | ORAL | Status: DC
  Administered 2012-09-05: 0.5 mg via ORAL
  Filled 2012-09-05 (×4): qty 1

## 2012-09-05 MED ORDER — RISPERIDONE 1 MG PO TABS
1.0000 mg | ORAL_TABLET | Freq: Every day | ORAL | Status: DC
  Administered 2012-09-06: 1 mg via ORAL
  Filled 2012-09-05 (×2): qty 1

## 2012-09-05 MED ORDER — DIVALPROEX SODIUM 125 MG PO CPSP
500.0000 mg | ORAL_CAPSULE | Freq: Every day | ORAL | Status: DC
  Administered 2012-09-06: 500 mg via ORAL
  Filled 2012-09-05 (×2): qty 4

## 2012-09-05 NOTE — Progress Notes (Signed)
Pt remains guarded, irritable, paranoid and superficial. She is overheard talking loudly to self in her room. When asked if she needs assistance pt states, "oh no, everything is fine." Attended evening group but participation was minimal and superficial. Pt observed plugging her ears with her fingers while sitting in group. Emotional support and encouragement given, especially during med pass. Studied pills suspiciously but did eventually take. She continues to deny AVH but is clearly responding to internal stimuli. She also denies SI/HI. Lawrence Marseilles

## 2012-09-05 NOTE — Progress Notes (Signed)
Psychoeducational Group Note  Date:  09/05/2012 Time:  0900  Group Topic/Focus:  Self Inventory  Participation Level:  Did Not Attend   Barbette Merino Shakeira Rhee Shari Prows 09/05/2012, 9:50 AM

## 2012-09-05 NOTE — Progress Notes (Signed)
Found pt sitting in floor in hallway crying this evening. Pt intermittently praying out loud while crying and telling this Clinical research associate "I'm sorry." Speech is pressured and halting at times. She verbalizes despair over her current situation and lack of housing. "They won't pick me up and take me home." When asked who "they" are pt responds "some are family, some are not." Pt medicated and checked for cheeking. Redirected as needed. Requested and was given trazadone for sleep. Continues to maintain she is free of hallucinations however pt clearly responding to internal stimuli as evidenced by her bizarre behavior and outbursts. Denies SI/HI. Lawrence Marseilles

## 2012-09-05 NOTE — Progress Notes (Signed)
Psychoeducational Group Note  Date:  09/05/2012 Time:  0945 am  Group Topic/Focus:  Making Healthy Choices:   The focus of this group is to help patients identify negative/unhealthy choices they were using prior to admission and identify positive/healthier coping strategies to replace them upon discharge.  Participation Level:  Did Not Attend    Shakeisha Horine J 09/05/2012, 10:29 AM  

## 2012-09-05 NOTE — Progress Notes (Signed)
D   Pt has been bizarre and isolative today  She is guarded and at first refused her medications however around noon she did take them prior to going to the cafeteria   She is clearly responding to internal stimuli and is resistant to admit same and becomes defensive if encouraged to talk about it   She did not attend group   She said she fell asleep in the chair last night and slept well with no soreness or stiffness A   Verbal support given   Medications administered and effectiveness monitored   Q 15 min checks   Present reality and logic when appropriate without agitating the pt   Praise decision to take medications R   Pt safe at present and responds fairly well to redirection

## 2012-09-05 NOTE — Progress Notes (Signed)
[name]  09/05/2012 1:53 PM [name]  MRN:  [id number]  Diagnosis:  Major depressive disorder, recurrent, severe with psychotic features  ADL's:  Intact  Sleep: Fair  Appetite:  Fair  Suicidal Ideation:  denies Homicidal Ideation:  denies  AEB (as evidenced by): Subjective: [name] states she is just too fatigued to talk and feels that she is going to "have a stroke" and then states that her legs feel funny but she is too tired to talk. She notes that she is "just not getting any better." She also reports that she is participating in all groups, but documentation shows she does not attend. Mental Status Examination/Evaluation: Objective:  Appearance: Disheveled  Eye Contact::  Fair  Speech:  Pressured and dramatic  Volume:  Normal  Mood:  Anxious and Irritable  impatient  Affect:  Labile  Thought Process:  Disorganized  Orientation:  Other:    Thought Content:  Delusions  Suicidal Thoughts:  No  Homicidal Thoughts:  No  Memory:  Immediate;   Poor Recent;   Poor Remote;   Poor  Judgement:  Impaired  Insight:  Lacking  Psychomotor Activity:  Normal  Concentration:  Fair  Recall:  Poor  Akathisia:  No  Handed:    AIMS (if indicated):     Assets:  Resilience  Sleep:  Number of Hours: 6    Vital Signs:Blood pressure 131/88, pulse 91, temperature 98.2 F (36.8 C), temperature source Oral, resp. rate 18, height 5' 4.75" (1.645 m), weight 107.049 kg (236 lb), last menstrual period 10/29/2011.  Objective: [name] is resistant to treatment, oppositional and rigid in her thinking. She is observed responding to internal stimulations and has conversations with people who are not present. Current Medications: Current Facility-Administered Medications  Medication Dose Route Frequency Provider Last Rate Last Dose  . acetaminophen (TYLENOL) tablet 650 mg  650 mg Oral Q6H PRN Shuvon Rankin, NP   650 mg at 08/30/12 2055  . alum & mag hydroxide-simeth  (MAALOX/MYLANTA) 200-200-20 MG/5ML suspension 30 mL  30 mL Oral Q4H PRN Shuvon Rankin, NP      . citalopram (CELEXA) tablet 20 mg  20 mg Oral Daily Mojeed Akintayo   20 mg at 09/05/12 1219  . divalproex (DEPAKOTE SPRINKLE) capsule 500 mg  500 mg Oral QHS Verne Spurr, PA-C      . hydrOXYzine (ATARAX/VISTARIL) tablet 25 mg  25 mg Oral Q6H PRN Jorje Guild, PA-C      . lisinopril (PRINIVIL,ZESTRIL) tablet 5 mg  5 mg Oral Daily Verne Spurr, PA-C   5 mg at 09/05/12 1220  . LORazepam (ATIVAN) tablet 1 mg  1 mg Oral Q6H PRN Verne Spurr, PA-C      . magnesium hydroxide (MILK OF MAGNESIA) suspension 30 mL  30 mL Oral Daily PRN Shuvon Rankin, NP      . risperiDONE (RISPERDAL M-TABS) disintegrating tablet 1 mg  1 mg Oral Q6H PRN Nanine Means, NP   1 mg at 08/29/12 1928  . risperiDONE (RISPERDAL) tablet 3 mg  3 mg Oral QHS Verne Spurr, PA-C   3 mg at 09/04/12 2123  . traZODone (DESYREL) tablet 100 mg  100 mg Oral QHS PRN Nanine Means, NP   100 mg at 09/04/12 2123  . [DISCONTINUED] divalproex (DEPAKOTE SPRINKLE) capsule 250 mg  250 mg Oral Q12H Verne Spurr, PA-C   250 mg at 09/05/12 1219    Lab Results: No results found for this or any previous visit (from the past 48 hour(s)).  Physical Findings: AIMS: Facial and Oral Movements Muscles of Facial Expression: None, normal Lips and Perioral Area: None, normal Jaw: None, normal Tongue: None, normal,Extremity Movements Upper (arms, wrists, hands, fingers): None, normal Lower (legs, knees, ankles, toes): None, normal, Trunk Movements Neck, shoulders, hips: None, normal, Overall Severity Severity of abnormal movements (highest score from questions above): None, normal Incapacitation due to abnormal movements: None, normal Patient's awareness of abnormal movements (rate only patient's report): No Awareness, Dental Status Current problems with teeth and/or dentures?: No Does patient usually wear dentures?: No  CIWA:  CIWA-Ar Total: 0  COWS:  COWS  Total Score: 0   Treatment Plan Summary: 1. Admit for crisis management and stabilization. 2. Medication management to reduce current symptoms to base line and improve the patient's overall level of functioning 3. Treat health problems as indicated. 4. Develop treatment plan to decrease risk of relapse upon discharge and the need for readmission. 5. Psycho-social education regarding relapse prevention and self care. 6. Health care follow up as needed for medical problems. 7. Restart home medications where appropriate.  Plan: 1. Will increase the risperdal to 1mg  each AM and 3mg  at HS. 2. Will add cogentin 0.5mg  po BID. 3. Depakote is increased to 500mg  and dosing is changed to HS. With a goal to titrate upward as needed for mood lability. 4. Samaire is still quite ill and her response to medication has been slow.  Will continue to titrate upward on Risperdal until a reduction of symptoms is noted with a goal of moving toward injectable medication. 5. She will need 3-5 more days of care as her symptoms are severe, she is out of touch with reality,continues to demonstrates poor judgement, and no insight into her situation.  Rona Ravens. Laverne Klugh PAC 09/05/2012, 1:53 PM

## 2012-09-05 NOTE — Clinical Social Work Note (Signed)
[facility] Group Notes: (Clinical Social Work)   09/05/2012   11:58 AM    Type of Therapy:  Group Therapy   Participation Level:  Did Not Attend    Ambrose Mantle, LCSW 09/05/2012, 11:58 AM

## 2012-09-06 DIAGNOSIS — E119 Type 2 diabetes mellitus without complications: Secondary | ICD-10-CM | POA: Clinically undetermined

## 2012-09-06 LAB — GLUCOSE, CAPILLARY
Glucose-Capillary: 118 mg/dL — ABNORMAL HIGH (ref 70–99)
Glucose-Capillary: 145 mg/dL — ABNORMAL HIGH (ref 70–99)

## 2012-09-06 MED ORDER — RISPERIDONE 2 MG PO TBDP
3.0000 mg | ORAL_TABLET | Freq: Every day | ORAL | Status: DC
  Administered 2012-09-06 – 2012-09-26 (×21): 3 mg via ORAL
  Filled 2012-09-06 (×24): qty 1

## 2012-09-06 MED ORDER — RISPERIDONE 1 MG PO TBDP
1.0000 mg | ORAL_TABLET | ORAL | Status: DC
  Administered 2012-09-06: 1 mg via ORAL
  Filled 2012-09-06 (×4): qty 1

## 2012-09-06 MED ORDER — BENZTROPINE MESYLATE 1 MG PO TABS
1.0000 mg | ORAL_TABLET | Freq: Two times a day (BID) | ORAL | Status: DC
  Administered 2012-09-06 – 2012-09-27 (×39): 1 mg via ORAL
  Filled 2012-09-06 (×19): qty 1
  Filled 2012-09-06: qty 6
  Filled 2012-09-06: qty 1
  Filled 2012-09-06: qty 6
  Filled 2012-09-06 (×25): qty 1

## 2012-09-06 MED ORDER — DIPHENHYDRAMINE HCL 50 MG/ML IJ SOLN
25.0000 mg | Freq: Once | INTRAMUSCULAR | Status: DC
  Filled 2012-09-06: qty 0.5

## 2012-09-06 MED ORDER — HALOPERIDOL LACTATE 5 MG/ML IJ SOLN
5.0000 mg | Freq: Once | INTRAMUSCULAR | Status: DC
  Filled 2012-09-06: qty 1

## 2012-09-06 MED ORDER — LORAZEPAM 2 MG/ML IJ SOLN: 2.0000 mg | Freq: Once | INTRAMUSCULAR | Status: DC

## 2012-09-06 NOTE — Clinical Social Work Note (Signed)
[name] did not come to AM group. I called shelter for a bed this AM, and they had one.  Discussed with treatment team who felt she is not yet well enough to d/c.  Will initiate referral to [facility] per tx team plan. [name] came to Wellness group briefly this PM.  Excused herself after 3 minutes and did not return.

## 2012-09-06 NOTE — Progress Notes (Signed)
Psychoeducational Group Note  Date:  09/06/2012 Time:  1100  Group Topic/Focus:  Wellness Toolbox:   The focus of this group is to discuss various aspects of wellness, balancing those aspects and exploring ways to increase the ability to experience wellness.  Patients will create a wellness toolbox for use upon discharge.  Participation Level:  Active  Participation Quality:  Redirectable and Sharing  Affect:  Irritable  Cognitive:  Confused  Insight:  Limited  Engagement in Group:  Limited  Additional Comments:  Pt has been having out burst and occasionally yelling and talking too her self. When you ask the pt is she ok, pt replied with "yes Im fine."  Saharsh Sterling M 09/06/2012, 12:19 PM

## 2012-09-06 NOTE — Progress Notes (Signed)
Inpatient Diabetes Program Recommendations  AACE/ADA: New Consensus Statement on Inpatient Glycemic Control (2013)  Target Ranges:  Prepandial:   less than 140 mg/dL      Peak postprandial:   less than 180 mg/dL (1-2 hours)      Critically ill patients:  140 - 180 mg/dL   Reason for Visit: Elevated HgbA1C - 6.6% (08/30/2012)  Patient presented to the ED after being homeless for 4 days after being evicted from her apartment. She was depressed and became suicidal with a plan to cut her wrist. Patient had considered suicide in the past by overdose but went to the ED for help. Admitted to [facility] in 2008 for depression and at [facility] in 2012 for depression with suicidal thoughts. She expresses anxiety and hopelessness over her homelessness and lack of employment. No hx DM.  Results for [name], [name] (MRN [id number]) as of 09/06/2012 16:19  Ref. Range 08/28/2012 00:53  Sodium Latest Range: 135-145 mEq/L 142  Potassium Latest Range: 3.5-5.1 mEq/L 4.3  Chloride Latest Range: 96-112 mEq/L 106  CO2 Latest Range: 19-32 mEq/L 22  BUN Latest Range: 6-23 mg/dL 17  Creatinine Latest Range: 0.50-1.10 mg/dL 1.19  Calcium Latest Range: 8.4-10.5 mg/dL 9.4  GFR calc non Af Amer Latest Range: >90 mL/min 88 (L)  GFR calc Af Amer Latest Range: >90 mL/min >90  Glucose Latest Range: 70-99 mg/dL 147 (H)  Results for [name], [name] (MRN [id number]) as of 09/06/2012 16:19  Ref. Range 08/30/2012 06:30  Hemoglobin A1C Latest Range: <5.7 % 6.6 (H)   Recommendations:   HgbA1C indicative of new-onset diabetes. (>6.5%) Would benefit from decreasing weight and increasing her activity level. Check CBGs bid and cover with Novolog sensitive. Living Well With Diabetes materials prior to discharge. Pt will need to f/u with PCP to manage DM.  Will continue to follow.

## 2012-09-06 NOTE — Progress Notes (Signed)
I agree with this note.  

## 2012-09-06 NOTE — Progress Notes (Signed)
[facility] MD Progress Note  09/05/2012 1:53 PM Aastha Dayley  MRN:  [id number]  Diagnosis:  Major depressive disorder, recurrent, severe with psychotic features  ADL's:  Intact  Sleep: Fair  Appetite:  Fair  Suicidal Ideation:  denies Homicidal Ideation:  denies  AEB (as evidenced by): Subjective: Kalyiah is seen 1:1 today. She is still quite ill despite increasing her medication yesterday. She is resistant and oppositional. Laylynn is notified that she is going to be getting regular blood sugar checks due to an elevated HgbA1c.  Mental Status Examination/Evaluation: Objective:  Appearance: Disheveled  Eye Contact::  Fair  Speech:  Goal directed with inflection  Volume:  Normal  Mood:   Irritable  Resistant to talk denies depression  Affect:  Labile reports her anxiety is a 30/10 because we aren't helping her.  Thought Process:  Disorganized  Orientation:  Other:    Thought Content:  Delusions  Suicidal Thoughts:  No  Homicidal Thoughts:  No  Memory:  Immediate;   Poor Recent;   Poor Remote;   Poor  Judgement:  Impaired  Insight:  Lacking  Psychomotor Activity:  Normal  Concentration:  Fair  Recall:  Poor  Akathisia:  No  Handed:    AIMS (if indicated):     Assets:  Resilience  Sleep:  Number of Hours: 6    Vital Signs:Blood pressure 131/88, pulse 91, temperature 98.2 F (36.8 C), temperature source Oral, resp. rate 18, height 5' 4.75" (1.645 m), weight 107.049 kg (236 lb), last menstrual period 10/29/2011.  Objective: Marigrace continues to be resistant to medication and shows no insight at all. She continues to respond to internal stimulation. Shaterrica voices her dissatisfaction that we are not helping her.  Current Medications: Current Facility-Administered Medications  Medication Dose Route Frequency Provider Last Rate Last Dose  . acetaminophen (TYLENOL) tablet 650 mg  650 mg Oral Q6H PRN Shuvon Rankin, NP   650 mg at 08/30/12 2055  . alum & mag hydroxide-simeth  (MAALOX/MYLANTA) 200-200-20 MG/5ML suspension 30 mL  30 mL Oral Q4H PRN Shuvon Rankin, NP      . citalopram (CELEXA) tablet 20 mg  20 mg Oral Daily Mojeed Akintayo   20 mg at 09/05/12 1219  . divalproex (DEPAKOTE SPRINKLE) capsule 500 mg  500 mg Oral QHS Verne Spurr, PA-C      . hydrOXYzine (ATARAX/VISTARIL) tablet 25 mg  25 mg Oral Q6H PRN Jorje Guild, PA-C      . lisinopril (PRINIVIL,ZESTRIL) tablet 5 mg  5 mg Oral Daily Verne Spurr, PA-C   5 mg at 09/05/12 1220  . LORazepam (ATIVAN) tablet 1 mg  1 mg Oral Q6H PRN Verne Spurr, PA-C      . magnesium hydroxide (MILK OF MAGNESIA) suspension 30 mL  30 mL Oral Daily PRN Shuvon Rankin, NP      . risperiDONE (RISPERDAL M-TABS) disintegrating tablet 1 mg  1 mg Oral Q6H PRN Nanine Means, NP   1 mg at 08/29/12 1928  . risperiDONE (RISPERDAL) tablet 3 mg  3 mg Oral QHS Verne Spurr, PA-C   3 mg at 09/04/12 2123  . traZODone (DESYREL) tablet 100 mg  100 mg Oral QHS PRN Nanine Means, NP   100 mg at 09/04/12 2123  . [DISCONTINUED] divalproex (DEPAKOTE SPRINKLE) capsule 250 mg  250 mg Oral Q12H Verne Spurr, PA-C   250 mg at 09/05/12 1219    Lab Results: No results found for this or any previous visit (from the past 48 hour(s)).  Physical  Findings: AIMS: Facial and Oral Movements Muscles of Facial Expression: None, normal Lips and Perioral Area: None, normal Jaw: None, normal Tongue: None, normal,Extremity Movements Upper (arms, wrists, hands, fingers): None, normal Lower (legs, knees, ankles, toes): None, normal, Trunk Movements Neck, shoulders, hips: None, normal, Overall Severity Severity of abnormal movements (highest score from questions above): None, normal Incapacitation due to abnormal movements: None, normal Patient's awareness of abnormal movements (rate only patient's report): No Awareness, Dental Status Current problems with teeth and/or dentures?: No Does patient usually wear dentures?: No  CIWA:  CIWA-Ar Total: 0  COWS:  COWS  Total Score: 0   Treatment Plan Summary: 1. Continue crisis management and stabilization. 2. Medication management to reduce current symptoms to base line and improve the patient's overall level of functioning 3. Treat health problems as indicated. 4. Develop treatment plan to decrease risk of relapse upon discharge and the need for readmission. 5. Psycho-social education regarding relapse prevention and self care. 6. Health care follow up as needed for medical problems. 7. Restart home medications where appropriate.  Plan: 1. Will increase the risperdal to 1mg  each AM and 3mg  at HS. 2. Will increase cogentin to 1 mg po BID. 3. Depakote is increased to 500mg  and dosing is changed to HS. With a goal to titrate upward as needed for mood lability. 4. Josi is still quite ill and her response to medication has been slow.  Will continue to titrate upward on Risperdal until a reduction of symptoms is noted with a goal of moving toward injectable medication. 5. After discussion with CM and MD Xiara will be placed on the [facility] wait list. Lloyd Huger T. Fabricio Endsley [facility] 09/06/2012 5:38 PM

## 2012-09-06 NOTE — Progress Notes (Addendum)
Patient ID: [name], female   DOB: 06-Oct-1966, 46 y.o.   MRN: [id number] Patient has been tearful and guarded today.  She has been reluctant to take her medications today, stating that "I'll take them later."  She had to be held back from going to the cafeteria at lunch due to her irratic behavior and she refused to come back onto the 400 hall until several staff members assisted her.  She sat in the day room during lunch and sobbed.  She denies any SI/HI/AVH.  She remains with sad affect and bizzare thought processes.  Continue to monitor medication management and MD orders.  Collaborate with treatment team members regarding patient's POC.  Safety checks continued every 15 minutes per protocol.

## 2012-09-06 NOTE — Progress Notes (Signed)
Pt up and asking to sit in the day room reporting she is afraid to be in her room. "I have a lot of people harassing me." When this writer asked if she were seeing or hearing these people, she responded irritably, "no, I'm a grown woman." Pt observed looking under bed and in bathroom. "I think something is in here that isn't supposed to be." Pt also describes urgency and frequency and states she is only able to urinate small amounts of foul smelling urine. Risperdal M Tab 1mg  given which pt took reluctantly. Also gave urine cup and wipes with instructions for clean catch. Emotional support and reassurance given. Will update day staff to obtain order for U/A (last spec 11-4). Pt currently pacing in hallway, tearful at times. Will continue to monitor closely. Lawrence Marseilles

## 2012-09-06 NOTE — Progress Notes (Signed)
Patient ID: [name], female   DOB: January 26, 1966, 46 y.o.   MRN: [id number] Staff called.  Patient is currently delusional and agitated, and displaying extremely disruptive behavior.  Patient is reported to not be compliant with medications.   PLAN: 1. Given patient's reported behavior, will give Haldol 5 mg, Ativan 2 mg, and Diphenhydramine 25 mg.

## 2012-09-06 NOTE — Treatment Plan (Signed)
Interdisciplinary Treatment Plan Update (Adult)  Date: 09/06/2012  Time Reviewed: 3:04 PM   Progress in Treatment: Attending groups: No Participating in groups: No Taking medication as prescribed: Yes Tolerating medication: Yes   Family/Significant other contact made:  No Patient understands diagnosis:  Yes Discussing patient identified problems/goals with staff:  Yes Medical problems stabilized or resolved:  Yes Denies suicidal/homicidal ideation: Yes Issues/concerns per patient self-inventory:  Not filled out Other:  New problem(s) identified: N/A  Reason for Continuation of Hospitalization: Hallucinations Medication stabilization  Interventions implemented related to continuation of hospitalization: Continue to increase antipsychotic  Encourage group attendance and participation  Initiate referral to [facility]  Additional comments:  Estimated length of stay: 3-5 days  Discharge Plan: unknown  New goal(s): N/A  Review of initial/current patient goals per problem list:   1.  Goal(s):Eliminate SI  Met:  No  Target date:11/15  As evidenced [id number] report   2.  Goal (s):Stabilize mood  Met:  No  Target date:11/15  As evidenced [id number] will rate her anxiety and depression at a 4 or less  3.  Goal(s): Eliminate psychosis  Met:  No  Target date:11/15  As evidenced [id number] report/observation  4.  Goal(s): Identify safe living situation and mental wellness plan  Met:  No  Target date:11/15  As evidenced by: previous plan was shelter and mental health, unclear now  Attendees: Patient:     Family:     Physician:  Thedore Mins 09/06/2012 3:04 PM   Nursing:    09/06/2012 3:04 PM   Clinical Social Worker:  Richelle Ito 09/06/2012 3:04 PM   Extender:  Verne Spurr PA 09/06/2012 3:04 PM   Other:     Other:     Other:     Other:      Scribe for Treatment Team:   Ida Rogue, 09/06/2012 3:04 PM

## 2012-09-07 LAB — URINALYSIS, ROUTINE W REFLEX MICROSCOPIC
Glucose, UA: NEGATIVE mg/dL
Leukocytes, UA: NEGATIVE
Protein, ur: NEGATIVE mg/dL
Specific Gravity, Urine: 1.015 (ref 1.005–1.030)
pH: 5.5 (ref 5.0–8.0)

## 2012-09-07 LAB — GLUCOSE, CAPILLARY

## 2012-09-07 MED ORDER — LORAZEPAM 1 MG PO TABS
1.0000 mg | ORAL_TABLET | Freq: Once | ORAL | Status: AC
  Administered 2012-09-07: 1 mg via ORAL

## 2012-09-07 MED ORDER — ZIPRASIDONE MESYLATE 20 MG IM SOLR
INTRAMUSCULAR | Status: AC
  Filled 2012-09-07: qty 20

## 2012-09-07 MED ORDER — ZIPRASIDONE MESYLATE 20 MG IM SOLR
10.0000 mg | Freq: Once | INTRAMUSCULAR | Status: AC
  Administered 2012-09-07: 10 mg via INTRAMUSCULAR
  Filled 2012-09-07: qty 20

## 2012-09-07 MED ORDER — RISPERIDONE 2 MG PO TBDP
3.0000 mg | ORAL_TABLET | ORAL | Status: DC
  Administered 2012-09-08 – 2012-09-27 (×19): 3 mg via ORAL
  Filled 2012-09-07 (×22): qty 1

## 2012-09-07 MED ORDER — DIVALPROEX SODIUM 125 MG PO CPSP
1000.0000 mg | ORAL_CAPSULE | Freq: Every day | ORAL | Status: DC
  Administered 2012-09-07: 500 mg via ORAL
  Filled 2012-09-07 (×2): qty 8

## 2012-09-07 NOTE — Progress Notes (Signed)
Approached pt, accompanied by the PA, Spencer, to encourage her to take her HS medications.  Pt then began telling us she was uncomfortable with the unmade bed in her room because it reminded her of a dead body and she brought up the code blue that happened last week on the hall.  Staff assured her that no one had coded in her room, but she said it bothered her that it was the room next to her.  She wanted the bed made up with linens.  The PA told her the bed would be made, but that she needed to take her scheduled meds.  The MHT made the bed and the meds were scanned for the pt to take.  The pt then became reluctant again saying that it was too much.  Offered to put the Depakote sprinkles in pudding for her.  At first she said ok, but when the pudding was opened, she said she didn't like how it looked.  By that time the PA had returned to the nurse's station and the pt again said she would not take them.  This Clinical research associate asked the MHT to go get the PA, but the pt said that was not necessary and she would take them.  Pt put all the pills/capsules in her mouth at the same time, then took small sips of water and acted as if she could not swallow them.  This writer remained in the room with the pt until she swallowed all the pills.  The Risperdal was in M-tab form and dissolved quickly.   Pt continued to talk about being uncomfortable in her room.  Pt was shown the quiet room as an alternative, but she returned to her room. Will continue to monitor.  Pt remains safe.

## 2012-09-07 NOTE — Progress Notes (Signed)
Patient ID: [name], female   DOB: 1966/08/13, 46 y.o.   MRN: [id number] close observation note: patient has been resting quietly since her initial placement on close observation.  She did request tylenol for a headache and promptly went to sleep.  Her behavior has been appropriate; she has been calm and cooperative.  She has not presented any aggressive or hostile behavior.  She has not been talkative with staff sitting with her.  She is sleeping in the chair in her room.  Continue to observe patient continuously for behavior.

## 2012-09-07 NOTE — Progress Notes (Signed)
Patient ID: Alvie Speltz, female   DOB: May 14, 1966, 46 y.o.   MRN: [id number] Patient's behavior has been continuously escalating today.  A patient reached across her to answer the phone today in the day room and patient went off on her, verbally cursing and swearing.  Patient was waving her hands and calling her a "lesbian bitch".  Patient was provoking the patient on the phone and the situation was becoming extremely threatening.  A show of force was needed where patient was escorted back to her room.  An injection of Geodon 10 mg was drawn up and readied which patient patient refused.  PA talked with patient and informed her she would be put on 1:1 observation.  Patient stated that she would take the injection, however, she would not allow anyone to stay in her room.  She stated "I will throw up with someone in my room.  I am fine now."  Staff counseled her on her inappropriate behavior and aggressiveness.  Patient spoke with director of the unit and it was decided she would take the injection and PA put in an order for close observation.  Patient is currently in her room calm and cooperative. She is being monitored for aggressive and escalating hostile behavior.  Her behavior is appropriate at this time.

## 2012-09-07 NOTE — Progress Notes (Signed)
Pt was in her room moaning and wailing, saying she was having a stroke.  MHT informed RN who requested the PA come to pt's room to assess.  VS were taken which were WNL.  Upon assessment, it was determined that pt was having a panic attack.  Pt had been given Ativan 1mg  earlier.  PA ordered an additional 1mg  be given now.  By the time this writer put in the order and took the med to the pt, she had calmed down and was having no more symptoms.  Pt took the med without incident.  Pt continues on Close Obs for safety d/t her unpredictable behavior.  Pt safe at this time.

## 2012-09-07 NOTE — Progress Notes (Signed)
Pt has continued agitated, but has not been verbally inappropriate since 1900.  Pt came to the med window about 2145 and asked for her "sleeping pill".  Informed pt that she had other meds that were scheduled.  Pt responded that she did not need those other meds, just the sleeping med.  Pt was given the Trazodone at that time.  Pt denies SI/HI/AV, but can be heard in her room yelling and cursing at "someone" even though she is the only one in the room.  When staff goes to her room, she says she is fine.  Will continue to monitor pt for escalating behavior.  Safety maintained with q15 minute checks.

## 2012-09-07 NOTE — Progress Notes (Signed)
Patient ID: [name], female   DOB: 1966/03/21, 46 y.o.   MRN: [id number] [facility] MD Progress Note  09/07/2012 10:44 AM [name]   MRN:  [id number]  Diagnosis:  Major depressive disorder, recurrent, severe with psychotic features  ADL's:  Intact  Sleep: Fair  Appetite:  Fair  Suicidal Ideation:  denies Homicidal Ideation:  denies  AEB (as evidenced by): Subjective: [name] is seen today  for follow up care. She remains delusional, psychotic and has become increasingly agitated. She has been observed talking to herself. She is still quite ill despite increasing her medication yesterday. She is resistant and oppositional. She has poor insight into her problem, she attributing her psychiatric disorder to a urinary tract infection. Patient look like she will benefit from higher level of care.  Mental Status Examination/Evaluation: Objective:  Appearance: Disheveled  Eye Contact::  Fair  Speech:  Goal directed with inflection  Volume:  Normal, low tone  Mood:   dysphoric  Affect:  Blunted but could be labile  Thought Process:  Disorganized  Orientation:  To person but not to time   Thought Content:  Delusions  Suicidal Thoughts:  No  Homicidal Thoughts:  No  Memory:  Immediate;   Poor Recent;   Poor Remote;   Poor  Judgement:  Impaired  Insight:  Lacking  Psychomotor Activity:  Normal  Concentration:  Fair  Recall:  Poor  Akathisia:  No  Handed:    AIMS (if indicated):     Assets:  Resilience  Sleep:  Number of Hours: 6.5    Vital Signs:Blood pressure 131/88, pulse 91, temperature 98.2 F (36.8 C), temperature source Oral, resp. rate 18, height 5' 4.75" (1.645 m), weight 107.049 kg (236 lb), last menstrual period 10/29/2011.  Objective: [name] continues to be resistant to medication and shows no insight at all. She continues to respond to internal stimulation. [name] her dissatisfaction that we are not helping her.  Current Medications: Current  Facility-Administered Medications  Medication Dose Route Frequency Provider Last Rate Last Dose  . acetaminophen (TYLENOL) tablet 650 mg  650 mg Oral Q6H PRN Shuvon Rankin, NP   650 mg at 08/30/12 2055  . alum & mag hydroxide-simeth (MAALOX/MYLANTA) 200-200-20 MG/5ML suspension 30 mL  30 mL Oral Q4H PRN Shuvon Rankin, NP      . citalopram (CELEXA) tablet 20 mg  20 mg Oral Daily Nihal Marzella   20 mg at 09/05/12 1219  . divalproex (DEPAKOTE SPRINKLE) capsule 500 mg  500 mg Oral QHS Verne Spurr, PA-C      . hydrOXYzine (ATARAX/VISTARIL) tablet 25 mg  25 mg Oral Q6H PRN Jorje Guild, PA-C      . lisinopril (PRINIVIL,ZESTRIL) tablet 5 mg  5 mg Oral Daily Verne Spurr, PA-C   5 mg at 09/05/12 1220  . LORazepam (ATIVAN) tablet 1 mg  1 mg Oral Q6H PRN Verne Spurr, PA-C      . magnesium hydroxide (MILK OF MAGNESIA) suspension 30 mL  30 mL Oral Daily PRN Shuvon Rankin, NP      . risperiDONE (RISPERDAL M-TABS) disintegrating tablet 1 mg  1 mg Oral Q6H PRN Nanine Means, NP   1 mg at 08/29/12 1928  . risperiDONE (RISPERDAL) tablet 3 mg  3 mg Oral QHS Verne Spurr, PA-C   3 mg at 09/04/12 2123  . traZODone (DESYREL) tablet 100 mg  100 mg Oral QHS PRN Nanine Means, NP   100 mg at 09/04/12 2123  . [DISCONTINUED] divalproex (DEPAKOTE SPRINKLE) capsule  250 mg  250 mg Oral Q12H Verne Spurr, PA-C   250 mg at 09/05/12 1219    Lab Results: No results found for this or any previous visit (from the past 48 hour(s)).  Physical Findings: AIMS: Facial and Oral Movements Muscles of Facial Expression: None, normal Lips and Perioral Area: None, normal Jaw: None, normal Tongue: None, normal,Extremity Movements Upper (arms, wrists, hands, fingers): None, normal Lower (legs, knees, ankles, toes): None, normal, Trunk Movements Neck, shoulders, hips: None, normal, Overall Severity Severity of abnormal movements (highest score from questions above): None, normal Incapacitation due to abnormal movements: None,  normal Patient's awareness of abnormal movements (rate only patient's report): No Awareness, Dental Status Current problems with teeth and/or dentures?: No Does patient usually wear dentures?: No  CIWA:  CIWA-Ar Total: 0  COWS:  COWS Total Score: 0   Treatment Plan Summary: 1. Continue crisis management and stabilization. 2. Medication management to reduce current symptoms to base line and improve the patient's overall level of functioning 3. Treat health problems as indicated. 4. Develop treatment plan to decrease risk of relapse upon discharge and the need for readmission. 5. Psycho-social education regarding relapse prevention and self care. 6. Health care follow up as needed for medical problems. 7. Restart home medications where appropriate.  Plan: 1. Will increase the Risperdal M-TAB to 3mg  each AM and 3mg  at HS. 2. Will increase cogentin to 1 mg po BID. 3. Depakote is increased to 1000mg  at bedtime to address increased agitation. 4. [name] is still quite ill and her response to medication has been slow.  Will continue to titrate upward on Risperdal until a reduction of symptoms is noted with a goal of moving toward injectable medication. 5. After discussion with CM and MD [name] will be placed on the [facility] wait list. Lloyd Huger T. Mashburn [facility] 09/07/2012 10:44 AM

## 2012-09-07 NOTE — Progress Notes (Signed)
[id number] 1966/09/17 3:02 PM 09/07/2012  [name] is a 46 y.o. female  Subjective: [name] escalated quickly and loudly without provocation. Another patient reached for the telephone and this upset [name]. She was verbally inappropriate, threatening physical harm to another patient.  She was redirected to her room, where she continued to scream and yell. She appeared to be responding again to internal stimulation.  BP 131/76  Pulse 88  Temp 98.9 F (37.2 C) (Oral)  Resp 18  Ht 5' 4.75" (1.645 m)  Wt 107.049 kg (236 lb)  BMI 39.58 kg/m2  LMP 10/29/2011  Objective: patient is escalating and will need 1:1 observation. She is currently redirected to her room. She was not as aggressive after a few minutes.  She is asked to take the IM Geodon, but declined. [name] is educated that she will have a 1:1 today and that her behavior and safety is our concern.  Assessment: increased mood lability.  Plan: 1:1 at this time.           Geodon will be on hold if the patient escalates again. [name]. [name] [facility] 09/07/2012 3:23 PM

## 2012-09-07 NOTE — Progress Notes (Signed)
Patient ID: [name], female   DOB: Jul 22, 1966, 46 y.o.   MRN: [id number] [facility] MD Progress Note  09/07/2012 10:44 AM Chyrstal Parmelee   MRN:  [id number]  Diagnosis:  Major depressive disorder, recurrent, severe with psychotic features  ADL's:  Intact  Sleep: Fair  Appetite:  Fair  Suicidal Ideation:  denies Homicidal Ideation:  denies  AEB (as evidenced by): Subjective: [name] is seen today  for follow up care. She remains delusional, psychotic and has become increasingly agitated. She has been observed talking to herself. She is still quite ill despite increasing her medication yesterday. She is resistant and oppositional. She has poor insight into her problem, she attributing her psychiatric disorder to a urinary tract infection. Patient look like she will benefit from higher level of care.  Mental Status Examination/Evaluation: Objective:  Appearance: Disheveled  Eye Contact::  Fair  Speech:  Goal directed with inflection  Volume:  Normal, low tone  Mood:   dysphoric  Affect:  Blunted but could be labile  Thought Process:  Disorganized  Orientation:  To person but not to time   Thought Content:  Delusions  Suicidal Thoughts:  No  Homicidal Thoughts:  No  Memory:  Immediate;   Poor Recent;   Poor Remote;   Poor  Judgement:  Impaired  Insight:  Lacking  Psychomotor Activity:  Normal  Concentration:  Fair  Recall:  Poor  Akathisia:  No  Handed:    AIMS (if indicated):     Assets:  Resilience  Sleep:  Number of Hours: 6.5    Vital Signs:Blood pressure 131/88, pulse 91, temperature 98.2 F (36.8 C), temperature source Oral, resp. rate 18, height 5' 4.75" (1.645 m), weight 107.049 kg (236 lb), last menstrual period 10/29/2011.  Objective: [name] continues to be resistant to medication and shows no insight at all. She continues to respond to internal stimulation. [name] voices her dissatisfaction that we are not helping her.  Current Medications: Current  Facility-Administered Medications  Medication Dose Route Frequency Provider Last Rate Last Dose  . acetaminophen (TYLENOL) tablet 650 mg  650 mg Oral Q6H PRN Shuvon Rankin, NP   650 mg at 08/30/12 2055  . alum & mag hydroxide-simeth (MAALOX/MYLANTA) 200-200-20 MG/5ML suspension 30 mL  30 mL Oral Q4H PRN Shuvon Rankin, NP      . citalopram (CELEXA) tablet 20 mg  20 mg Oral Daily Anelly Samarin   20 mg at 09/05/12 1219  . divalproex (DEPAKOTE SPRINKLE) capsule 500 mg  500 mg Oral QHS Verne Spurr, PA-C      . hydrOXYzine (ATARAX/VISTARIL) tablet 25 mg  25 mg Oral Q6H PRN Jorje Guild, PA-C      . lisinopril (PRINIVIL,ZESTRIL) tablet 5 mg  5 mg Oral Daily Verne Spurr, PA-C   5 mg at 09/05/12 1220  . LORazepam (ATIVAN) tablet 1 mg  1 mg Oral Q6H PRN Verne Spurr, PA-C      . magnesium hydroxide (MILK OF MAGNESIA) suspension 30 mL  30 mL Oral Daily PRN Shuvon Rankin, NP      . risperiDONE (RISPERDAL M-TABS) disintegrating tablet 1 mg  1 mg Oral Q6H PRN Nanine Means, NP   1 mg at 08/29/12 1928  . risperiDONE (RISPERDAL) tablet 3 mg  3 mg Oral QHS Verne Spurr, PA-C   3 mg at 09/04/12 2123  . traZODone (DESYREL) tablet 100 mg  100 mg Oral QHS PRN Nanine Means, NP   100 mg at 09/04/12 2123  . [DISCONTINUED] divalproex (DEPAKOTE SPRINKLE) capsule  250 mg  250 mg Oral Q12H Verne Spurr, PA-C   250 mg at 09/05/12 1219    Lab Results: No results found for this or any previous visit (from the past 48 hour(s)).  Physical Findings: AIMS: Facial and Oral Movements Muscles of Facial Expression: None, normal Lips and Perioral Area: None, normal Jaw: None, normal Tongue: None, normal,Extremity Movements Upper (arms, wrists, hands, fingers): None, normal Lower (legs, knees, ankles, toes): None, normal, Trunk Movements Neck, shoulders, hips: None, normal, Overall Severity Severity of abnormal movements (highest score from questions above): None, normal Incapacitation due to abnormal movements: None,  normal Patient's awareness of abnormal movements (rate only patient's report): No Awareness, Dental Status Current problems with teeth and/or dentures?: No Does patient usually wear dentures?: No  CIWA:  CIWA-Ar Total: 0  COWS:  COWS Total Score: 0   Treatment Plan Summary: 1. Continue crisis management and stabilization. 2. Medication management to reduce current symptoms to base line and improve the patient's overall level of functioning 3. Treat health problems as indicated. 4. Develop treatment plan to decrease risk of relapse upon discharge and the need for readmission. 5. Psycho-social education regarding relapse prevention and self care. 6. Health care follow up as needed for medical problems. 7. Restart home medications where appropriate.  Plan: 1. Will increase the Risperdal M-TAB to 3mg  each AM and 3mg  at HS. 2. Will increase cogentin to 1 mg po BID. 3. Depakote is increased to 1000mg  at bedtime to address increased agitation. 4. [name] is still quite ill and her response to medication has been slow.  Will continue to titrate upward on Risperdal until a reduction of symptoms is noted with a goal of moving toward injectable medication. 5. After discussion with CM and MD Cheila will be placed on the [facility] wait list.   Thedore Mins, MD 09/07/2012 11:02 AM

## 2012-09-07 NOTE — Progress Notes (Signed)
Close Obs note:  Pt in the dayroom getting something to eat.  Pt was agitated earlier around dinner time and did not eat.  Pt continues uncooperative and agitated although she is speaking softly and has not raised her voice since this writer came on shift.  She had been in bed asleep most of the evening.  Pt is being cooperative with the MHT who is with pt for the close obs.  Pt again was reluctant to take her meds this evening.  She refused at first, saying it was more than she had last night.  Pt is correct as her Depakote Sprinkles was increased to 8 capsules.  Pt agreed to take 4 caps like she did last night, otherwise she was not going to take any of it.  Pt had no complaints of discomfort or pain.  Pt still speaks in an arrogant/entitled manner.  Continue close obs d/t pt's unpredictable behaviors.  Sitter within sight of pt at all times.  Pt is safe at this time.

## 2012-09-07 NOTE — Progress Notes (Signed)
Patient ID: [name], female   DOB: 07/01/1966, 46 y.o.   MRN: [id number] [facility] MD Progress Note  09/07/2012 10:44 AM Vasudha Marinaccio   MRN:  [id number]  Diagnosis:  Major depressive disorder, recurrent, severe with psychotic features  ADL's:  Intact  Sleep: Fair  Appetite:  Fair  Suicidal Ideation:  denies Homicidal Ideation:  denies  AEB (as evidenced by): Subjective: [name] is seen today  for follow up care. She remains delusional, psychotic and has become increasingly agitated. She has been observed talking to herself. She is still quite ill despite increasing her medication yesterday. She is resistant and oppositional. She has poor insight into her problem, she attributing her psychiatric disorder to a urinary tract infection. Patient look like she will benefit from higher level of care.  Mental Status Examination/Evaluation: Objective:  Appearance: Disheveled  Eye Contact::  Fair  Speech:  Goal directed with inflection  Volume:  Normal, low tone  Mood:   dysphoric  Affect:  Blunted but could be labile  Thought Process:  Disorganized  Orientation:  To person but not to time   Thought Content:  Delusions  Suicidal Thoughts:  No  Homicidal Thoughts:  No  Memory:  Immediate;   Poor Recent;   Poor Remote;   Poor  Judgement:  Impaired  Insight:  Lacking  Psychomotor Activity:  Normal  Concentration:  Fair  Recall:  Poor  Akathisia:  No  Handed:    AIMS (if indicated):     Assets:  Resilience  Sleep:  Number of Hours: 6.5    Vital Signs:Blood pressure 131/88, pulse 91, temperature 98.2 F (36.8 C), temperature source Oral, resp. rate 18, height 5' 4.75" (1.645 m), weight 107.049 kg (236 lb), last menstrual period 10/29/2011.  Objective: [name] continues to be resistant to medication and shows no insight at all. She continues to respond to internal stimulation. [name] voices her dissatisfaction that we are not helping her.  Current Medications: Current  Facility-Administered Medications  Medication Dose Route Frequency Provider Last Rate Last Dose  . acetaminophen (TYLENOL) tablet 650 mg  650 mg Oral Q6H PRN Shuvon Rankin, NP   650 mg at 08/30/12 2055  . alum & mag hydroxide-simeth (MAALOX/MYLANTA) 200-200-20 MG/5ML suspension 30 mL  30 mL Oral Q4H PRN Shuvon Rankin, NP      . citalopram (CELEXA) tablet 20 mg  20 mg Oral Daily Aamilah Augenstein   20 mg at 09/05/12 1219  . divalproex (DEPAKOTE SPRINKLE) capsule 500 mg  500 mg Oral QHS Verne Spurr, PA-C      . hydrOXYzine (ATARAX/VISTARIL) tablet 25 mg  25 mg Oral Q6H PRN Jorje Guild, PA-C      . lisinopril (PRINIVIL,ZESTRIL) tablet 5 mg  5 mg Oral Daily Verne Spurr, PA-C   5 mg at 09/05/12 1220  . LORazepam (ATIVAN) tablet 1 mg  1 mg Oral Q6H PRN Verne Spurr, PA-C      . magnesium hydroxide (MILK OF MAGNESIA) suspension 30 mL  30 mL Oral Daily PRN Shuvon Rankin, NP      . risperiDONE (RISPERDAL M-TABS) disintegrating tablet 1 mg  1 mg Oral Q6H PRN Nanine Means, NP   1 mg at 08/29/12 1928  . risperiDONE (RISPERDAL) tablet 3 mg  3 mg Oral QHS Verne Spurr, PA-C   3 mg at 09/04/12 2123  . traZODone (DESYREL) tablet 100 mg  100 mg Oral QHS PRN Nanine Means, NP   100 mg at 09/04/12 2123  . [DISCONTINUED] divalproex (DEPAKOTE SPRINKLE) capsule  250 mg  250 mg Oral Q12H Verne Spurr, PA-C   250 mg at 09/05/12 1219    Lab Results: No results found for this or any previous visit (from the past 48 hour(s)).  Physical Findings: AIMS: Facial and Oral Movements Muscles of Facial Expression: None, normal Lips and Perioral Area: None, normal Jaw: None, normal Tongue: None, normal,Extremity Movements Upper (arms, wrists, hands, fingers): None, normal Lower (legs, knees, ankles, toes): None, normal, Trunk Movements Neck, shoulders, hips: None, normal, Overall Severity Severity of abnormal movements (highest score from questions above): None, normal Incapacitation due to abnormal movements: None,  normal Patient's awareness of abnormal movements (rate only patient's report): No Awareness, Dental Status Current problems with teeth and/or dentures?: No Does patient usually wear dentures?: No  CIWA:  CIWA-Ar Total: 0  COWS:  COWS Total Score: 0   Treatment Plan Summary: 1. Continue crisis management and stabilization. 2. Medication management to reduce current symptoms to base line and improve the patient's overall level of functioning 3. Treat health problems as indicated. 4. Develop treatment plan to decrease risk of relapse upon discharge and the need for readmission. 5. Psycho-social education regarding relapse prevention and self care. 6. Health care follow up as needed for medical problems. 7. Restart home medications where appropriate.  Plan: 1. Will increase the Risperdal M-TAB to 3mg  each AM and 3mg  at HS. 2. Will increase cogentin to 1 mg po BID. 3. Depakote is increased to 1000mg  at bedtime to address increased agitation. 4. [name] is still quite ill and her response to medication has been slow.  Will continue to titrate upward on Risperdal until a reduction of symptoms is noted with a goal of moving toward injectable medication. 5. After discussion with CM and MD Katelynne will be placed on the [facility] wait list. [name] [facility] 09/07/2012 10:54 AM

## 2012-09-07 NOTE — Progress Notes (Signed)
Pt has been in and out of her room claiming that she cannot sleep in it because of the "spirits".  Pt became argumentative with MHT, wanting to stay in the dayroom.  Pt began to get louder and confrontational.  Spoke with CN who said she could stay in the dayroom for 15-20 minutes until she was able to calm down.  When the time was up, pt had gone to sleep in a chair.  CN and this writer woke pt up and assisted her to her room.  Pt refused to get in her bed, but sat down in the chair in her room.  She insisted that the lights remain on.  Assured pt that she would continue to be checked on q15 minutes.

## 2012-09-08 LAB — GLUCOSE, CAPILLARY
Glucose-Capillary: 93 mg/dL (ref 70–99)
Glucose-Capillary: 145 mg/dL — ABNORMAL HIGH (ref 70–99)

## 2012-09-08 MED ORDER — OLANZAPINE 10 MG PO TBDP: 10.0000 mg | ORAL_TABLET | Freq: Three times a day (TID) | ORAL | Status: DC | PRN

## 2012-09-08 MED ORDER — DIVALPROEX SODIUM ER 500 MG PO TB24
500.0000 mg | ORAL_TABLET | Freq: Two times a day (BID) | ORAL | Status: DC
  Administered 2012-09-08 – 2012-09-22 (×28): 500 mg via ORAL
  Filled 2012-09-08 (×34): qty 1

## 2012-09-08 NOTE — Progress Notes (Signed)
Close Observation Note Patient currently in room resting on bed with MHT present. The patient denies any concerns or questions at this time. The patient maintains a labile mood and affect. The patient is having minimal interaction within the milieu and prefers to isolate to her room. Patient remains on close observation for behavior. 

## 2012-09-08 NOTE — Progress Notes (Signed)
  Psychoeducational Group Note  Date:  09/08/2012 Time:  1100  Group Topic/Focus:  Personal Choices and Values:   The focus of this group is to help patients assess and explore the importance of values in their lives, how their values affect their decisions, how they express their values and what opposes their expression.  Participation Level:  Did Not Attend  Participation Quality:    Affect:    Cognitive:    Insight:    Engagement in Group:    Additional Comments:  Pt was sleeping all day long, pt refused to attend.  Isla Pence M 09/08/2012, 5:09 PM

## 2012-09-08 NOTE — Progress Notes (Signed)
Close Observation Note Patient currently in room resting on bed with MHT present. The patient denies any concerns or questions at this time. The patient maintains a labile mood and affect. The patient is having minimal interaction within the milieu and prefers to isolate to her room. Patient remains on close observation for behavior.

## 2012-09-08 NOTE — Progress Notes (Signed)
Pt resting in bed with eyes closed.  Respirations even/unlabored.  Continue close obs for safety d/t unpredictable behaviors.  Sitter within sight of patient.  Pt remains safe.

## 2012-09-08 NOTE — Progress Notes (Signed)
Patient ID: [name], female   DOB: 1966/05/30, 46 y.o.   MRN: [id number] [facility] MD Progress Note  09/08/2012 10:47 AM Kendall Arnell   MRN:  [id number]  Diagnosis:  Major depressive disorder, recurrent, severe with psychotic features                     R/O Bipolar disorder  ADL's:  Intact  Sleep: Fair  Appetite:  Fair  Suicidal Ideation:  denies Homicidal Ideation:  denies  AEB (as evidenced by): Subjective: [name] is seen in her room today for follow up care, she was too sleeping to attend treatment plan meeting. She reports that she slept very late last night. She reportedly became agitated yesterday instigating fight with peers and calling them names. She remains oppositional, delusional, psychotic and  showing poor insight and judgment. She is still quite ill despite increasing her medication yesterday. Patient will benefit from higher level of care.  Mental Status Examination/Evaluation: Objective:  Appearance: Disheveled  Eye Contact::  Fair  Speech:  Clear and coherent  Volume:  Normal, low tone  Mood:   dysphoric  Affect:  Blunted but could be labile  Thought Process:  Disorganized  Orientation:  To person but not to time   Thought Content:  Delusions  Suicidal Thoughts:  No  Homicidal Thoughts:  No  Memory:  Immediate;   Poor Recent;   Poor Remote;   Poor  Judgement:  Impaired  Insight:  Lacking  Psychomotor Activity:  Normal  Concentration:  Fair  Recall:  Poor  Akathisia:  No  Handed:    AIMS (if indicated):     Assets:  Resilience  Sleep:  Number of Hours: 6.5    Vital Signs:Blood pressure 131/88, pulse 91, temperature 98.2 F (36.8 C), temperature source Oral, resp. rate 18, height 5' 4.75" (1.645 m), weight 107.049 kg (236 lb), last menstrual period 10/29/2011.  Objective: [name] continues to be resistant to medication and shows no insight at all. She continues to respond to internal stimulation. [name] voices her dissatisfaction that we are not  helping her.  Current Medications: Current Facility-Administered Medications  Medication Dose Route Frequency Provider Last Rate Last Dose  . acetaminophen (TYLENOL) tablet 650 mg  650 mg Oral Q6H PRN Shuvon Rankin, NP   650 mg at 08/30/12 2055  . alum & mag hydroxide-simeth (MAALOX/MYLANTA) 200-200-20 MG/5ML suspension 30 mL  30 mL Oral Q4H PRN Shuvon Rankin, NP      . citalopram (CELEXA) tablet 20 mg  20 mg Oral Daily Daesha Insco   20 mg at 09/05/12 1219  . divalproex (DEPAKOTE SPRINKLE) capsule 500 mg  500 mg Oral QHS Verne Spurr, PA-C      . hydrOXYzine (ATARAX/VISTARIL) tablet 25 mg  25 mg Oral Q6H PRN Jorje Guild, PA-C      . lisinopril (PRINIVIL,ZESTRIL) tablet 5 mg  5 mg Oral Daily Verne Spurr, PA-C   5 mg at 09/05/12 1220  . LORazepam (ATIVAN) tablet 1 mg  1 mg Oral Q6H PRN Verne Spurr, PA-C      . magnesium hydroxide (MILK OF MAGNESIA) suspension 30 mL  30 mL Oral Daily PRN Shuvon Rankin, NP      . risperiDONE (RISPERDAL M-TABS) disintegrating tablet 1 mg  1 mg Oral Q6H PRN Nanine Means, NP   1 mg at 08/29/12 1928  . risperiDONE (RISPERDAL) tablet 3 mg  3 mg Oral QHS Verne Spurr, PA-C   3 mg at 09/04/12 2123  . traZODone (DESYREL)  tablet 100 mg  100 mg Oral QHS PRN Nanine Means, NP   100 mg at 09/04/12 2123  . [DISCONTINUED] divalproex (DEPAKOTE SPRINKLE) capsule 250 mg  250 mg Oral Q12H Verne Spurr, PA-C   250 mg at 09/05/12 1219    Lab Results: No results found for this or any previous visit (from the past 48 hour(s)).  Physical Findings: AIMS: Facial and Oral Movements Muscles of Facial Expression: None, normal Lips and Perioral Area: None, normal Jaw: None, normal Tongue: None, normal,Extremity Movements Upper (arms, wrists, hands, fingers): None, normal Lower (legs, knees, ankles, toes): None, normal, Trunk Movements Neck, shoulders, hips: None, normal, Overall Severity Severity of abnormal movements (highest score from questions above): None,  normal Incapacitation due to abnormal movements: None, normal Patient's awareness of abnormal movements (rate only patient's report): No Awareness, Dental Status Current problems with teeth and/or dentures?: No Does patient usually wear dentures?: No  CIWA:  CIWA-Ar Total: 0  COWS:  COWS Total Score: 0   Treatment Plan Summary: 1. Continue crisis management and stabilization. 2. Medication management to reduce current symptoms to base line and improve the patient's overall level of functioning 3. Treat health problems as indicated. 4. Develop treatment plan to decrease risk of relapse upon discharge and the need for readmission. 5. Psycho-social education regarding relapse prevention and self care. 6. Health care follow up as needed for medical problems. 7. Restart home medications where appropriate.  Plan: 1. Will continue Risperdal M-TAB  3mg  each AM and 3mg  at HS. 2. Will continue cogentin  1 mg po BID. 3. Will change Depakote ER to 500mg  po BID to address increased agitation. 4. Shellina is still quite ill and her response to medication has been slow.  Will continue to titrate upward on Risperdal until a reduction of symptoms is noted with a goal of moving toward injectable medication. 5. Due to lack of improvement of her symptoms, she will be referred to higher level of care; Application will be sent to [facility].   Thedore Mins, MD 09/08/2012 10:47 AM

## 2012-09-08 NOTE — Progress Notes (Signed)
Note d/t Pt on close obs.@ 2200. D: Pt is resting in bed with eyes closed. Breathing is even & unlabored. A: Close observation continues d/t pt,s unpredictable behaviors.R: Pt safety maintained.

## 2012-09-08 NOTE — Progress Notes (Signed)
D: Patient denies SI/HI and auditory and visual hallucinations, however, the patient does appear to be responding to internal stimuli as evidenced by the patient screaming and yelling in her room while alone. The patient has a labile mood and affect and can shift between being appropriate to yelling quickly. The patient isolates on the unit and prefers to stay in her room and have minimal interaction within the milieu.  A: Patient given emotional support from RN. Patient encouraged to come to staff with concerns and/or questions. Patient's medication routine continued. Patient's orders and plan of care reviewed.  R: Patient remains cooperative. Will continue to monitor patient q15 minutes for safety. Patient remains on a 1:1 for close observation.

## 2012-09-08 NOTE — Progress Notes (Signed)
Psychoeducational Group Note  Date:  09/08/2012 Time:  2000  Group Topic/Focus:  Wrap-Up Group:   The focus of this group is to help patients review their daily goal of treatment and discuss progress on daily workbooks.  Participation Level: Did Not Attend  Participation Quality:  Not Applicable  Affect:  Not Applicable  Cognitive:  Not Applicable  Insight:  Not Applicable  Engagement in Group: Not Applicable  Additional Comments:  Pt did not attend, was asleep in room.  Christ Kick 09/08/2012, 12:13 AM

## 2012-09-08 NOTE — Progress Notes (Signed)
Psychoeducational Group Note  Date:  09/08/2012 Time: 2000  Group Topic/Focus:  Wrap-Up Group:   The focus of this group is to help patients review their daily goal of treatment and discuss progress on daily workbooks.  Participation Level:  Active  Participation Quality:  Attentive  Affect:  Appropriate  Cognitive:  Appropriate  Insight:  Limited  Engagement in Group:  Limited  Additional Comments:  Patient shared that she wanted this hospital to get her a home and that is what she will focus on.  Nikesh Teschner, Newton Pigg 09/08/2012, 9:30 PM

## 2012-09-08 NOTE — Progress Notes (Signed)
Close obs. Note @ 1800. Pt got up & was getting her evening meal. Pt attempted to cheek her 1700 medications. Pt. Pretended to swallow the med but she had spit them into her hand. With some encouragement Pt. Did swallow them.Pt labile & irritable @ times.A: Close observation is still in place due to pt,s unpredictable behaviors.CBG@ 1700= 87.R: Pt continues on close observation.Pt safety maintained.

## 2012-09-08 NOTE — Clinical Social Work Note (Signed)
Pt did not attend morning aftercare planning group or afternoon group.

## 2012-09-08 NOTE — Progress Notes (Signed)
Pt resting in bed with eyes closed.  Snoring softly.  Respirations even/unlabored.  Sitter within sight of patient.  Pt continues on close obs for safety.  Pt remains safe.

## 2012-09-09 LAB — GLUCOSE, CAPILLARY
Glucose-Capillary: 112 mg/dL — ABNORMAL HIGH (ref 70–99)
Glucose-Capillary: 155 mg/dL — ABNORMAL HIGH (ref 70–99)

## 2012-09-09 NOTE — Progress Notes (Signed)
Pt just came up to the med window requesting her second prn dose of Trazodone 100mg .  Pt had been asleep, but got up to go to the bathroom.  She also requested ginger ale.  When RN was getting the medication, pt tipped the cup of ginger ale over into the med room floor.  Pt then said that she wanted gatorade instead.  Pt did not apologize for the spill.  Pt was given a cup of gatorade.  MHT later reported that pt had done the same thing with a different RN on another day.  Pt returned to her room with sitter, and is now back in bed.  Sitter within view of patient on close obs.  Pt safe at this time.

## 2012-09-09 NOTE — Progress Notes (Signed)
D: Patient denies SI/HI and auditory and visual hallucinations but is responding to internal stimuli as evidenced by her verbal outbursts when in her room. The patient has a labile mood and affect. The patient refuses to fill out her self-inventory form. The patient remains on close observation for safety. The patient prefers to isolate during the day and has little to no interaction within the milieu. The patient is still paranoid when accepting medications, food and drink from staff. The patient prefers to watch staff pour her drinks and open her medications.  A: Patient given emotional support from RN. Patient encouraged to come to staff with concerns and/or questions. Patient's medication routine continued. Patient's orders and plan of care reviewed.  R: Patient remains cooperative. Will continue to monitor patient q15 minutes for safety.

## 2012-09-09 NOTE — Treatment Plan (Signed)
Interdisciplinary Treatment Plan Update (Adult)  Date: 09/09/2012  Time Reviewed: 8:37 AM   Progress in Treatment: Attending groups: No Participating in groups: No Taking medication as prescribed: Yes Tolerating medication: Yes   Family/Significant other contact made:  No Patient understands diagnosis:  Yes Discussing patient identified problems/goals with staff:  Yes Medical problems stabilized or resolved:  Yes Denies suicidal/homicidal ideation: Yes No Issues/concerns per patient self-inventory:  Not filled out Other:  New problem(s) identified: N/A  Reason for Continuation of Hospitalization: Depression Hallucinations Medication stabilization  Interventions implemented related to continuation of hospitalization: continue to increase psychotropics,  Continue 1:1 due to labile mood/unpredictability,  Encourage group attendance and participation  Additional comments:  Estimated length of stay:3-5 days  Discharge Plan: [facility]/shelter if stabilized  New goal(s): Get on [facility] wait list-accomplished today  Review of initial/current patient goals per problem list:   1.  Goal(s):Eliminate SI  Met:  No  Target date:11/18  As evidenced ZO:XWRU report verbally and on self inventory  2.  Goal (s): Stabilize mood by decreasing depression to self report of 3 or less and demonstrating decreased lability to the extent that there are no altercations with others  Met:  No  Target date:11/18  As evidenced EA:[id number] report of patient verbally and on self inventory  3.  Goal(s):Eliminate psychosis  Met:  No  Target date:11/18  As evidenced by: self report/observation of no internal conversations  4.  Goal(s):  Met:  No  Target date:  As evidenced by:  Attendees: Patient:  [name] 09/09/2012 8:37 AM  Family:     Physician:  Thedore Mins 09/09/2012 8:37 AM   Nursing: Joslyn Devon   09/09/2012 8:37 AM   Clinical Social Worker:  Richelle Ito  09/09/2012 8:37 AM   Extender:  Verne Spurr PA 09/09/2012 8:37 AM   Other:     Other:     Other:     Other:      Scribe for Treatment Team:   Ida Rogue, 09/09/2012 8:37 AM

## 2012-09-09 NOTE — Clinical Social Work Note (Signed)
[facility] Group Notes:  (Counselor/Nursing/MHT/Case Management/Adjunct)  09/09/2012 2:49 PM   Type of Therapy:  Group Therapy  Participation Level:  Minimal   Summary of Progress/Problems: Topic-Balance: The topic for group was balance in life. Pt walked in toward the end of group, stayed for approximately five minutes, then walked out. She did not disturb the group while in session but did not participate in activity.   Smart, Heather N 09/09/2012, 2:34 PM

## 2012-09-09 NOTE — Progress Notes (Signed)
Patient ID: [name], female   DOB: [birth year], 46 y.o.   MRN: [id number] [facility] MD Progress Note  09/09/2012 11:03 AM Blimi Godby   MRN:  [id number]  Diagnosis:  Major depressive disorder, recurrent, severe with psychotic features                     R/O Bipolar disorder  ADL's:  Intact  Sleep: poor  Appetite:  Fair  Suicidal Ideation:  denies Homicidal Ideation:  denies  AEB (as evidenced by): Subjective: Sarahmarie is seen in her room, she refused to come to the treatment room saying she is sleeping. She reports that she did not sleep well last. She is socially withdrawn for her peers, has difficulty getting along with others. She remains oppositional, delusional, psychotic and  showing poor insight and judgment. She continues to demand that the hospital provide her with a place to live, a job and a car. Patient is not making much improvement, will benefit from higher level of care.  Mental Status Examination/Evaluation: Objective:  Appearance: casual  Eye Contact::  Fair  Speech:  Clear and coherent  Volume:  Normal, low tone  Mood:   dysphoric  Affect:  Blunted but could be labile  Thought Process:  Disorganized  Orientation:  To person but not to time   Thought Content:  Delusions  Suicidal Thoughts:  No  Homicidal Thoughts:  No  Memory:  Immediate;   Poor Recent;   Poor Remote;   Poor  Judgement:  Impaired  Insight:  Lacking  Psychomotor Activity:  Normal  Concentration:  Fair  Recall:  Poor  Akathisia:  No  Handed:    AIMS (if indicated):     Assets:  Resilience  Sleep:  Number of Hours: 4   Vital Signs:Blood pressure 131/88, pulse 91, temperature 98.2 F (36.8 C), temperature source Oral, resp. rate 18, height 5' 4.75" (1.645 m), weight 107.049 kg (236 lb), last menstrual period 10/29/2011.  Objective: Symphoni continues to be resistant to medication and shows no insight at all. She continues to respond to internal stimulation. Antoniette voices her  dissatisfaction that we are not helping her.  Current Medications: Current Facility-Administered Medications  Medication Dose Route Frequency Provider Last Rate Last Dose  . acetaminophen (TYLENOL) tablet 650 mg  650 mg Oral Q6H PRN Shuvon Rankin, NP   650 mg at 08/30/12 2055  . alum & mag hydroxide-simeth (MAALOX/MYLANTA) 200-200-20 MG/5ML suspension 30 mL  30 mL Oral Q4H PRN Shuvon Rankin, NP      . citalopram (CELEXA) tablet 20 mg  20 mg Oral Daily Tahjae Durr,MD   20 mg at 09/05/12 1219  . divalproex (DEPAKOTE ER) capsule 500 mg  500 mg Oral BID Thomas Mabry,MD      . hydrOXYzine (ATARAX/VISTARIL) tablet 25 mg  25 mg Oral Q6H PRN Jorje Guild, PA-C      . lisinopril (PRINIVIL,ZESTRIL) tablet 5 mg  5 mg Oral Daily Verne Spurr, PA-C   5 mg at 09/05/12 1220  . LORazepam (ATIVAN) tablet 1 mg  1 mg Oral Q6H PRN Verne Spurr, PA-C      . magnesium hydroxide (MILK OF MAGNESIA) suspension 30 mL  30 mL Oral Daily PRN Shuvon Rankin, NP      . risperiDONE (RISPERDAL M-TABS) disintegrating tablet 1 mg  1 mg Oral Q6H PRN Nanine Means, NP   1 mg at 08/29/12 1928  . risperiDONE (RISPERDAL) tablet 3 mg  3 mg Oral BID Thedore Mins, MD  3 mg at 09/04/12 2123  . traZODone (DESYREL) tablet 100 mg  100 mg Oral QHS PRN Nanine Means, NP   100 mg at 09/04/12 2123  . [DISCONTINUED] divalproex (DEPAKOTE SPRINKLE) capsule 250 mg  250 mg Oral Q12H Verne Spurr, PA-C   250 mg at 09/05/12 1219    Lab Results: No results found for this or any previous visit (from the past 48 hour(s)).  Physical Findings: AIMS: Facial and Oral Movements Muscles of Facial Expression: None, normal Lips and Perioral Area: None, normal Jaw: None, normal Tongue: None, normal,Extremity Movements Upper (arms, wrists, hands, fingers): None, normal Lower (legs, knees, ankles, toes): None, normal, Trunk Movements Neck, shoulders, hips: None, normal, Overall Severity Severity of abnormal movements (highest score from questions  above): None, normal Incapacitation due to abnormal movements: None, normal Patient's awareness of abnormal movements (rate only patient's report): No Awareness, Dental Status Current problems with teeth and/or dentures?: No Does patient usually wear dentures?: No  CIWA:  CIWA-Ar Total: 0  COWS:  COWS Total Score: 0   Treatment Plan Summary: 1. Continue crisis management and stabilization. 2. Medication management to reduce current symptoms to base line and improve the patient's overall level of functioning 3. Treat health problems as indicated. 4. Develop treatment plan to decrease risk of relapse upon discharge and the need for readmission. 5. Psycho-social education regarding relapse prevention and self care. 6. Health care follow up as needed for medical problems. 7. Restart home medications where appropriate.  Plan: 1. Will continue Risperdal M-TAB  3mg  each AM and 3mg  at HS. 2. Will continue cogentin  1 mg po BID. 3. Will continue Depakote ER  500mg  po BID to address increased agitation. 4. Shacara is still quite ill and her response to medication has been slow.  Will continue to titrate upward on Risperdal until a reduction of symptoms as needed with a goal of moving toward injectable medication. 5. Due to lack of improvement of her symptoms, she will be referred to higher level of care; Application has been sent to the [facility].   Thedore Mins, MD 09/09/2012 11:03 AM

## 2012-09-09 NOTE — Progress Notes (Signed)
Close Observation Note Patient observed in hallway, leaving group early. Patient stated that she wanted to "go back to her room." MHT was present with patient. Patient denies any concerns at this time. Will continue to monitor patient for safety.

## 2012-09-09 NOTE — Progress Notes (Signed)
Pt resting in bed with eyes closed.  No distress observed.  Continue 1:1 for unpredictable behaviors.  Sitter within view of pt.  Pt remains safe.

## 2012-09-09 NOTE — Progress Notes (Signed)
Close Observation Note Patient currently in room resting with sitter present. Patient remains labile, occasionally having verbal outbursts when alone in room. The patient denies and concerns or questions at this time. Will continue to monitor patient for safety.

## 2012-09-09 NOTE — Progress Notes (Signed)
[facility] Group Notes:  (Counselor/Nursing/MHT/Case Management/Adjunct)  09/09/2012 2:38 PM  Type of Therapy:  Psychoeducational Skills  Participation Level:  Did Not Attend  Summary of Progress/Problems: Rosaleah did not attend psychoeducation group and where group was asked to participate in activity sharing one goal for the day, one thing the is grateful for and one things that makes them unique.    Wandra Scot 09/09/2012, 2:38 PM

## 2012-09-09 NOTE — Progress Notes (Signed)
Close Observation Note Patient currently in group room watching television. Sitter present. Patient currently denies any questions or concerns at this time. The patient remains on close observation at this time. Will continue to monitor patient for safety.

## 2012-09-09 NOTE — Clinical Social Work Note (Signed)
Pt did not attend discharge planning group.   

## 2012-09-09 NOTE — Progress Notes (Signed)
D   Pt is labile and unpredictable at times   She is childlike and guarded   She did request to go to karoke group and was appropriate for the time she spent there A   Pt on 1:1   Medications administered and effectiveness monitored   Q 15 min checks  Verbal support given R   Pt safe at present

## 2012-09-10 LAB — GLUCOSE, CAPILLARY
Glucose-Capillary: 102 mg/dL — ABNORMAL HIGH (ref 70–99)
Glucose-Capillary: 118 mg/dL — ABNORMAL HIGH (ref 70–99)

## 2012-09-10 NOTE — Progress Notes (Signed)
[facility] Group Notes:  (Counselor/Nursing/MHT/Case Management/Adjunct)  09/10/2012 4:19 PM  Type of Therapy: Process Group Therapy  Participation Level:  Minimal  Participation Quality:  Intrusive  Affect:  Flat  Cognitive:  Delusional  Insight:  None  Engagement in Group:  None  Engagement in Therapy:  Limited  Modes of Intervention:  Clarification  Summary of Progress/Problems:The main focus of process group therapy was about the struggles that each patient faces on a regular basis in the outside world. Then the patient processed what could be utilized as a coping skill to help overcome the issue.  The patient was unable to focus on the group topic. The patient remain delusional throughout the session talking about being under surveillance by the government.   [name] Renee 09/10/2012, 4:19 PM

## 2012-09-10 NOTE — Progress Notes (Signed)
Patient ID: [name], female   DOB: 1966-09-06, 46 y.o.   MRN: [id number] [facility] MD Progress Note  09/10/2012 10:13 AM [name]   MRN:  [id number]  Diagnosis:  Major depressive disorder, recurrent, severe with psychotic features                     R/O Bipolar disorder  ADL's:  Intact  Sleep: fair  Appetite:  Fair  Suicidal Ideation:  denies Homicidal Ideation:  denies  AEB (as evidenced by): Subjective: She remains oppositional, delusional, psychotic and  showing poor insight and judgment. She continues to demand that the hospital provide her with a place to live, a job and a car. Patient is requesting to be taking off 1:1 today and states that she will tell the staff if she needs to get on again.  Mental Status Examination/Evaluation: Objective:  Appearance: casual  Eye Contact::  Fair  Speech:  Clear and coherent  Volume:  Normal, low tone  Mood:   dysphoric  Affect:  Blunted but could be labile  Thought Process:  Disorganized  Orientation:  To person but not to time   Thought Content:  Delusions  Suicidal Thoughts:  No  Homicidal Thoughts:  No  Memory:  Immediate;   Poor Recent;   Poor Remote;   Poor  Judgement:  Impaired  Insight:  Lacking  Psychomotor Activity:  Normal  Concentration:  Fair  Recall:  Poor  Akathisia:  No  Handed:    AIMS (if indicated):     Assets:  Resilience  Sleep:  Number of Hours: 4   Vital Signs:Blood pressure 131/88, pulse 91, temperature 98.2 F (36.8 C), temperature source Oral, resp. rate 18, height 5' 4.75" (1.645 m), weight 107.049 kg (236 lb), last menstrual period 10/29/2011.  Objective: [name] continues to be resistant to medication and shows no insight at all. She continues to respond to internal stimulation. [name] voices her dissatisfaction that we are not helping her.  Current Medications: Current Facility-Administered Medications  Medication Dose Route Frequency Provider Last Rate Last Dose  . acetaminophen  (TYLENOL) tablet 650 mg  650 mg Oral Q6H PRN Shuvon Rankin, NP   650 mg at 08/30/12 2055  . alum & mag hydroxide-simeth (MAALOX/MYLANTA) 200-200-20 MG/5ML suspension 30 mL  30 mL Oral Q4H PRN Shuvon Rankin, NP      . citalopram (CELEXA) tablet 20 mg  20 mg Oral Daily Aaryn Parrilla,MD   20 mg at 09/05/12 1219  . divalproex (DEPAKOTE ER) capsule 500 mg  500 mg Oral BID Joniqua Sidle,MD      . hydrOXYzine (ATARAX/VISTARIL) tablet 25 mg  25 mg Oral Q6H PRN Jorje Guild, PA-C      . lisinopril (PRINIVIL,ZESTRIL) tablet 5 mg  5 mg Oral Daily Verne Spurr, PA-C   5 mg at 09/05/12 1220  . LORazepam (ATIVAN) tablet 1 mg  1 mg Oral Q6H PRN Verne Spurr, PA-C      . magnesium hydroxide (MILK OF MAGNESIA) suspension 30 mL  30 mL Oral Daily PRN Shuvon Rankin, NP      . risperiDONE (RISPERDAL M-TABS) disintegrating tablet 1 mg  1 mg Oral Q6H PRN Nanine Means, NP   1 mg at 08/29/12 1928  . risperiDONE (RISPERDAL) tablet 3 mg  3 mg Oral BID Thedore Mins, MD   3 mg at 09/04/12 2123  . traZODone (DESYREL) tablet 100 mg  100 mg Oral QHS PRN Nanine Means, NP   100 mg at 09/04/12 2123  . [  DISCONTINUED] divalproex (DEPAKOTE SPRINKLE) capsule 250 mg  250 mg Oral Q12H Verne Spurr, PA-C   250 mg at 09/05/12 1219    Lab Results: No results found for this or any previous visit (from the past 48 hour(s)).  Physical Findings: AIMS: Facial and Oral Movements Muscles of Facial Expression: None, normal Lips and Perioral Area: None, normal Jaw: None, normal Tongue: None, normal,Extremity Movements Upper (arms, wrists, hands, fingers): None, normal Lower (legs, knees, ankles, toes): None, normal, Trunk Movements Neck, shoulders, hips: None, normal, Overall Severity Severity of abnormal movements (highest score from questions above): None, normal Incapacitation due to abnormal movements: None, normal Patient's awareness of abnormal movements (rate only patient's report): No Awareness, Dental Status Current problems  with teeth and/or dentures?: No Does patient usually wear dentures?: No  CIWA:  CIWA-Ar Total: 0  COWS:  COWS Total Score: 0   Treatment Plan Summary: 1. Continue crisis management and stabilization. 2. Medication management to reduce current symptoms to base line and improve the patient's overall level of functioning 3. Treat health problems as indicated. 4. Develop treatment plan to decrease risk of relapse upon discharge and the need for readmission. 5. Psycho-social education regarding relapse prevention and self care. 6. Health care follow up as needed for medical problems. 7. Restart home medications where appropriate.  Plan: 1. Will continue Risperdal M-TAB  3mg  each AM and 3mg  at HS. 2. Will continue cogentin  1 mg po BID. 3. Will continue Depakote ER  500mg  po BID to address increased agitation. 4. Patient is awaiting placement to the [facility].   Thedore Mins, MD 09/10/2012 10:13 AM

## 2012-09-10 NOTE — Progress Notes (Addendum)
D: Patient denies SI/HI and auditory and visual hallucinations but is responding to internal stimuli as evidenced by her screaming while alone in her room. The patient has a labile mood and affect. The patient also exhibits paranoia toward staff and requests food and drink to shown to her before taking them. The patient rates her depression and hopelessness both a 10 out of 10 (1 low/10 high). The patient reports sleeping well and states that her appetite is good but that her energy level is low. The patient is isolating on the unit and prefers to sleep day and night in her room.  A: Patient given emotional support from RN. Patient encouraged to come to staff with concerns and/or questions. Patient's medication routine continued. Patient's orders and plan of care reviewed. The patient's 1:1 was recently discontinued around 1000.  R: Patient remains somewhat cooperative. Will continue to monitor patient q15 minutes for safety.

## 2012-09-10 NOTE — Progress Notes (Signed)
D: "I feel good!" Pt. Denies S/H/I's, A/V/H's or other issues. Pt.asked if she could go to the cafeteria with her 1:1 and looked disappointed, then asked if she could go to the cafe. before her peers with her 1:1 "to see what they have".  Pt. began to rescind her enthusiasm about feeling better and said, "I don't think I'm ready: I think I want to hurt somebody".  A: Pt. was encouraged to consider the benefits of socializing with her peers and that the doctor would be consulted about taking her off her 1:1 observations.  Pt. was also reminded that socialization was part of her treatment and could not stay on 1:1 observations forever.  Pt. has a needy affect and behavior and seems to enjoy having the 1:1 observer near her at all times.  Pt. was reported to have enjoyed Kareoke last night with her peers.  R: Pt. remains on 1:1 observations but seems to be enjoying the company and is not genuine in her need for close observations. Will report to Dr. Jannifer Franklin.

## 2012-09-10 NOTE — Progress Notes (Signed)
[facility] Group Notes:  (Counselor/Nursing/MHT/Case Management/Adjunct)  09/10/2012 9:53 PM  Type of Therapy:  Psychoeducational Skills  Participation Level:  Active  Participation Quality:  Appropriate  Affect:  Anxious  Cognitive:  Appropriate  Insight:  Limited  Engagement in Group:  Good  Engagement in Therapy:  Good  Modes of Intervention:  Education  Summary of Progress/Problems: The patient verbalized that she had a good day since her 1:1 observation was discontinued. In addition, she verbalized that she attended all of her groups. She mentioned that their was an issue at dinner in which she didn't go to the cafeteria. She was unable to come up with a goal for tomorrow.  The patient was provided with two separate snacks and some fluids following group. She had multiple requests within a very short period of time.    Hazle Coca S 09/10/2012, 9:53 PM

## 2012-09-11 MED ORDER — OLANZAPINE 5 MG PO TBDP
5.0000 mg | ORAL_TABLET | Freq: Every day | ORAL | Status: DC
  Administered 2012-09-11 – 2012-09-26 (×16): 5 mg via ORAL
  Filled 2012-09-11 (×4): qty 1
  Filled 2012-09-11: qty 3
  Filled 2012-09-11 (×14): qty 1

## 2012-09-11 NOTE — Progress Notes (Signed)
Patient ID: [name], female   DOB: 01-08-1966, 46 y.o.   MRN: [id number] D. The patient continues to be labile. She can be pleasant, but often has a sarcastic tone when speaking to others. Wandered in and out of wrap up group. Was not able to participate.  A. Encouraged to take her medication. Assessed for hallucinations. R. Compliant with medications. Denies any a/v hallucinations, however, appears to be actively responding to auditory hallucinations. Having loud conversations while alone in her room.

## 2012-09-11 NOTE — Progress Notes (Signed)
[facility] MD Progress Note  09/11/2012 2:58 PM [name]  MRN:  [id number]  Diagnosis:  Major depressive disorder, recurrent, severe with psychotic features   ADL's:  Impaired  Sleep: Poor  Appetite:  Fair  Suicidal Ideation:  denies Homicidal Ideation:  denies  AEB (as evidenced by): Subjective: Met with the patient 1:1 to discuss her symptoms and her response to treatment. She is still disorganized and continues to respond to internal stimulation. She does not go to the dining hall for meals due to her impulsive behaviors. Mental Status Examination/Evaluation: Objective:  Appearance: Disheveled  Eye Contact::  Fair  Speech:  Normal Rate  Volume:  Normal  Mood:  Depressed and Irritable  Affect:  Constricted  Thought Process:  Disorganized  Orientation:  Full  Thought Content:  Delusions and Hallucinations: Command:  continues to have conversations with people who aren't there.  Suicidal Thoughts:  No  Homicidal Thoughts:  No  Memory:  Immediate;   Poor  Judgement:  Impaired  Insight:  Lacking  Psychomotor Activity:  Normal  Concentration:  Poor  Recall:  Poor  Akathisia:  No  Handed:    AIMS (if indicated):     Assets:  Desire for Improvement Physical Health  Sleep:  Number of Hours: 3.5    Vital Signs:Blood pressure 115/82, pulse 106, temperature 98.2 F (36.8 C), temperature source Oral, resp. rate 18, height 5' 4.75" (1.645 m), weight 107.049 kg (236 lb), last menstrual period 10/29/2011. Current Medications: Current Facility-Administered Medications  Medication Dose Route Frequency Provider Last Rate Last Dose  . acetaminophen (TYLENOL) tablet 650 mg  650 mg Oral Q6H PRN Shuvon Rankin, NP   650 mg at 09/08/12 1800  . alum & mag hydroxide-simeth (MAALOX/MYLANTA) 200-200-20 MG/5ML suspension 30 mL  30 mL Oral Q4H PRN Shuvon Rankin, NP      . benztropine (COGENTIN) tablet 1 mg  1 mg Oral BID Verne Spurr, PA-C   1 mg at 09/11/12 1220  . citalopram (CELEXA)  tablet 20 mg  20 mg Oral Daily Mojeed Akintayo   20 mg at 09/11/12 1220  . diphenhydrAMINE (BENADRYL) injection 25 mg  25 mg Intravenous Once Larena Sox, MD      . divalproex (DEPAKOTE ER) 24 hr tablet 500 mg  500 mg Oral BID Mojeed Akintayo   500 mg at 09/11/12 1218  . hydrOXYzine (ATARAX/VISTARIL) tablet 25 mg  25 mg Oral Q6H PRN Jorje Guild, PA-C      . lisinopril (PRINIVIL,ZESTRIL) tablet 5 mg  5 mg Oral Daily Verne Spurr, PA-C   5 mg at 09/11/12 1220  . LORazepam (ATIVAN) injection 2 mg  2 mg Intramuscular Once Larena Sox, MD      . LORazepam (ATIVAN) tablet 1 mg  1 mg Oral Q6H PRN Verne Spurr, PA-C   1 mg at 09/07/12 2254  . magnesium hydroxide (MILK OF MAGNESIA) suspension 30 mL  30 mL Oral Daily PRN Shuvon Rankin, NP      . OLANZapine zydis (ZYPREXA) disintegrating tablet 10 mg  10 mg Oral Q8H PRN Mojeed Akintayo      . OLANZapine zydis (ZYPREXA) disintegrating tablet 5 mg  5 mg Oral QHS Verne Spurr, PA-C      . risperiDONE (RISPERDAL M-TABS) disintegrating tablet 3 mg  3 mg Oral QHS Verne Spurr, PA-C   3 mg at 09/10/12 2158  . risperiDONE (RISPERDAL M-TABS) disintegrating tablet 3 mg  3 mg Oral BH-q7a Verne Spurr, PA-C   3 mg at 09/11/12  1610  . traZODone (DESYREL) tablet 100 mg  100 mg Oral QHS PRN Nanine Means, NP   100 mg at 09/09/12 2132    Lab Results:  Results for orders placed during the hospital encounter of 08/28/12 (from the past 48 hour(s))  GLUCOSE, CAPILLARY     Status: Abnormal   Collection Time   09/09/12  4:55 PM      Component Value Range Comment   Glucose-Capillary 155 (*) 70 - 99 mg/dL   GLUCOSE, CAPILLARY     Status: Abnormal   Collection Time   09/10/12 11:27 AM      Component Value Range Comment   Glucose-Capillary 102 (*) 70 - 99 mg/dL   GLUCOSE, CAPILLARY     Status: Abnormal   Collection Time   09/10/12  4:48 PM      Component Value Range Comment   Glucose-Capillary 118 (*) 70 - 99 mg/dL   GLUCOSE, CAPILLARY     Status: Abnormal    Collection Time   09/11/12  6:28 AM      Component Value Range Comment   Glucose-Capillary 115 (*) 70 - 99 mg/dL     Physical Findings: AIMS: Facial and Oral Movements Muscles of Facial Expression: None, normal Lips and Perioral Area: None, normal Jaw: None, normal Tongue: None, normal,Extremity Movements Upper (arms, wrists, hands, fingers): None, normal Lower (legs, knees, ankles, toes): None, normal, Trunk Movements Neck, shoulders, hips: None, normal, Overall Severity Severity of abnormal movements (highest score from questions above): None, normal Incapacitation due to abnormal movements: None, normal Patient's awareness of abnormal movements (rate only patient's report): No Awareness, Dental Status Current problems with teeth and/or dentures?: No Does patient usually wear dentures?: No  CIWA:  CIWA-Ar Total: 0  COWS:  COWS Total Score: 0   Treatment Plan Summary: Daily contact with patient to assess and evaluate symptoms and progress in treatment Medication management  Plan: 1. Discussed medications with the pharm-D and will add Zyprexa to Risperdal as regular order will start Zyprexa 5mg  M-tab at hs tonight. 2. Continue all other medications as written. 3. Will follow. Rona Ravens. Lashanta Elbe PAC 09/11/2012, 2:58 PM

## 2012-09-11 NOTE — Progress Notes (Signed)
Psychoeducational Group Note  Date:  09/11/2012 Time:  1045  Group Topic/Focus:  Making Healthy Choices:   The focus of this group is to help patients identify negative/unhealthy choices they were using prior to admission and identify positive/healthier coping strategies to replace them upon discharge.  Participation Level:  Did Not Attend  Participation Quality:    Affect:      Cognitive:    Insight:    Engagement in Group:    Additional Comments:    Cresenciano Lick 09/11/2012, 11:30 AM

## 2012-09-11 NOTE — Clinical Social Work Note (Signed)
[facility] Group Notes: (Clinical Social Work)   09/11/2012  11:15-11:45am   Type of Therapy:  Group Therapy   Participation Level:  Did Not Attend    Ambrose Mantle, LCSW 09/11/2012, 12:52 PM

## 2012-09-11 NOTE — Progress Notes (Signed)
D  Pt spent most of the morning in bed  She is superficial and appears to respond to internal stimuli  She continues to be somewhat unpredictable and impulsive   She continues to respond loudly to internal stimuli in her room behind closed doors  A   Verbal support given  Medications administered and effectiveness monitored   Q 15 min checks R   Pt safe at present

## 2012-09-11 NOTE — Progress Notes (Signed)
Psychoeducational Group Note  Date:  09/11/2012 Time:  2000  Group Topic/Focus:  Wrap-Up Group:   The focus of this group is to help patients review their daily goal of treatment and discuss progress on daily workbooks.  Participation Level:  Active  Participation Quality:  Appropriate  Affect:  Appropriate  Cognitive:  Appropriate  Insight:  Good  Engagement in Group:  Good  Additional Comments:  Patient attended and participated in group tonight. She reports having a boring none interesting day. Patient advised that she had a plan. She stated that she did not attend any of her groups today, she slept through groups.  Lita Mains [facility] 09/11/2012, 8:57 PM

## 2012-09-12 LAB — GLUCOSE, CAPILLARY

## 2012-09-12 NOTE — Progress Notes (Signed)
D   Pt has isolated to her room all day   She has refused medications   She was given several opportunities to change her mind and take the medications but continues to refuse them   Instructed pt non compliance would result in prolonged hospitalization and possible transfer to [facility]   Pt has not attended groups and has had no interaction with others A   Verbal support given  Will continue to encourage med compliance and treatment compliance  Q 15 min R   Pt safe but continues to refuse medications

## 2012-09-12 NOTE — Progress Notes (Signed)
Psychoeducational Group Note  Date:  09/12/2012 Time:  0945 am  Group Topic/Focus:  Making Healthy Choices:   The focus of this group is to help patients identify negative/unhealthy choices they were using prior to admission and identify positive/healthier coping strategies to replace them upon discharge.  Participation Level:  Did Not Attend    Lynora Dymond J 09/12/2012, 10:29 AM  

## 2012-09-12 NOTE — Clinical Social Work Note (Signed)
[facility] Group Notes: (Clinical Social Work)   09/12/2012   11:15-11:45AM   Type of Therapy:  Group Therapy   Participation Level:  Did Not Attend    Ambrose Mantle, LCSW 09/12/2012, 11:55 AM

## 2012-09-12 NOTE — Progress Notes (Signed)
[facility] MD Progress Note  09/12/2012 12:53 PM [name]  MRN:  [id number]  Diagnosis:  Major depressive disorder, recurrent, severe with psychotic features   ADL's:  Impaired  Sleep: Poor  Appetite:  Fair  Suicidal Ideation:  denies Homicidal Ideation:  denies  AEB (as evidenced by): Subjective: Patient has complained mild headache and the has plan of asking staff nurse Tylenol, who. Patient continues to respond to internal stimulation. She she has been staying in her room mostly isolated, not interacting much. She has no behavioral problems on the unit. She does not required restraints. Patient has no extrapyramidal symptoms.  Mental Status Examination/Evaluation: Objective:  Appearance: Disheveled  Eye Contact::  Fair  Speech:  Normal Rate  Volume:  Normal  Mood:  Depressed and Irritable  Affect:  Constricted  Thought Process:  Disorganized  Orientation:  Full  Thought Content:  Delusions and Hallucinations: Command:  continues to have conversations with people who aren't there.  Suicidal Thoughts:  No  Homicidal Thoughts:  No  Memory:  Immediate;   Poor  Judgement:  Impaired  Insight:  Lacking  Psychomotor Activity:  Normal  Concentration:  Poor  Recall:  Poor  Akathisia:  No  Handed:    AIMS (if indicated):     Assets:  Desire for Improvement Physical Health  Sleep:  Number of Hours: 3.75    Vital Signs:Blood pressure 115/82, pulse 106, temperature 98.2 F (36.8 C), temperature source Oral, resp. rate 18, height 5' 4.75" (1.645 m), weight 107.049 kg (236 lb), last menstrual period 10/29/2011.  Current Medications: Current Facility-Administered Medications  Medication Dose Route Frequency Provider Last Rate Last Dose  . acetaminophen (TYLENOL) tablet 650 mg  650 mg Oral Q6H PRN Shuvon Rankin, NP   650 mg at 09/08/12 1800  . alum & mag hydroxide-simeth (MAALOX/MYLANTA) 200-200-20 MG/5ML suspension 30 mL  30 mL Oral Q4H PRN Shuvon Rankin, NP      . benztropine  (COGENTIN) tablet 1 mg  1 mg Oral BID Verne Spurr, PA-C   1 mg at 09/11/12 1220  . citalopram (CELEXA) tablet 20 mg  20 mg Oral Daily Mojeed Akintayo   20 mg at 09/11/12 1220  . diphenhydrAMINE (BENADRYL) injection 25 mg  25 mg Intravenous Once Larena Sox, MD      . divalproex (DEPAKOTE ER) 24 hr tablet 500 mg  500 mg Oral BID Mojeed Akintayo   500 mg at 09/11/12 1218  . hydrOXYzine (ATARAX/VISTARIL) tablet 25 mg  25 mg Oral Q6H PRN Jorje Guild, PA-C      . lisinopril (PRINIVIL,ZESTRIL) tablet 5 mg  5 mg Oral Daily Verne Spurr, PA-C   5 mg at 09/11/12 1220  . LORazepam (ATIVAN) injection 2 mg  2 mg Intramuscular Once Larena Sox, MD      . LORazepam (ATIVAN) tablet 1 mg  1 mg Oral Q6H PRN Verne Spurr, PA-C   1 mg at 09/07/12 2254  . magnesium hydroxide (MILK OF MAGNESIA) suspension 30 mL  30 mL Oral Daily PRN Shuvon Rankin, NP      . OLANZapine zydis (ZYPREXA) disintegrating tablet 10 mg  10 mg Oral Q8H PRN Mojeed Akintayo      . OLANZapine zydis (ZYPREXA) disintegrating tablet 5 mg  5 mg Oral QHS Verne Spurr, PA-C      . risperiDONE (RISPERDAL M-TABS) disintegrating tablet 3 mg  3 mg Oral QHS Verne Spurr, PA-C   3 mg at 09/10/12 2158  . risperiDONE (RISPERDAL M-TABS) disintegrating tablet 3  mg  3 mg Oral [address] PA-C   3 mg at 09/11/12 0865  . traZODone (DESYREL) tablet 100 mg  100 mg Oral QHS PRN Nanine Means, NP   100 mg at 09/09/12 2132    Lab Results:  Results for orders placed during the hospital encounter of 08/28/12 (from the past 48 hour(s))  GLUCOSE, CAPILLARY     Status: Abnormal   Collection Time   09/09/12  4:55 PM      Component Value Range Comment   Glucose-Capillary 155 (*) 70 - 99 mg/dL   GLUCOSE, CAPILLARY     Status: Abnormal   Collection Time   09/10/12 11:27 AM      Component Value Range Comment   Glucose-Capillary 102 (*) 70 - 99 mg/dL   GLUCOSE, CAPILLARY     Status: Abnormal   Collection Time   09/10/12  4:48 PM      Component  Value Range Comment   Glucose-Capillary 118 (*) 70 - 99 mg/dL   GLUCOSE, CAPILLARY     Status: Abnormal   Collection Time   09/11/12  6:28 AM      Component Value Range Comment   Glucose-Capillary 115 (*) 70 - 99 mg/dL     Physical Findings: AIMS: Facial and Oral Movements Muscles of Facial Expression: None, normal Lips and Perioral Area: None, normal Jaw: None, normal Tongue: None, normal,Extremity Movements Upper (arms, wrists, hands, fingers): None, normal Lower (legs, knees, ankles, toes): None, normal, Trunk Movements Neck, shoulders, hips: None, normal, Overall Severity Severity of abnormal movements (highest score from questions above): None, normal Incapacitation due to abnormal movements: None, normal Patient's awareness of abnormal movements (rate only patient's report): No Awareness, Dental Status Current problems with teeth and/or dentures?: No Does patient usually wear dentures?: No  CIWA:  CIWA-Ar Total: 0  COWS:  COWS Total Score: 0   Treatment Plan Summary: Daily contact with patient to assess and evaluate symptoms and progress in treatment Medication management  Plan: 1. Patient will continue her current medication management and treatment plan. 2. Patient was encouraged to compliant with her medication management as prescribed. 3. Patient has no extrapyramidal symptoms and disposition plan as per the primary treatment team   Leata Mouse, MD  09/12/2012, 12:53 PM

## 2012-09-12 NOTE — Progress Notes (Signed)
D: Pt continues to be labile with affect and mood angry/anxious/irritable.  Eye contact intense with worried facial expression; most interactions hostile and negative, although did attend group with peers.  Continues to be impulsive and unpredictable, so has been closely observed while in open areas.  Denies SI, states positive HI with "secret" target who is not on unit; denies AVH but was repeatedly observed in silent conversation with no apparent recipient- responding to internal stimuli inferred.  Contracting for safety.  During 1:1, Pt stated "I'm really upset and angry- I don't want to talk about it" while smiling pleasantly.  Thought process tangential with paranoia, judgment impaired.  While speaking with this RN, Pt suddenly adopted hostile expression and stated very angrily "stop that now, that's threatening behavior, you need to leave now" while gaze was fixated on my pen.  Pt then immediately engaged in private silent conversation in direction of back of couch.  Initially agreed to take HS meds but then abruptly walked away.  Returned at urging of another RN 20 minutes later, took medications on condition that all pills be pulled out new from [employer].  At approximately 0030, Pt found sitting on floor in bedroom by Charge RN, tearful re: disposition after discharge.  Refused PRNs. Approached staff @ 0130 to get PRNs.  CBG @ 1700 = 92.  A: Provided offer of emotional support so that Pt could maintain control of relationship; encouraged her to approach staff with any needs or if in distress.  Administered requested PRNs @ 0156- Ativan 1mg  PO, Trazodone 100mg  PO; declined PRNs to help with racing thoughts/voices.  Medications administered according to med orders and POC.  Q15 minute safety checks maintained as per unit policy, also random checks by RN due to Pts recent behavioral impulsivity and unpredictability.  R: Pt labile throughout evening but overall compliant.  Slept after receiving PRNs.  Safety  maintained. Dion Saucier RN

## 2012-09-13 LAB — GLUCOSE, CAPILLARY
Glucose-Capillary: 112 mg/dL — ABNORMAL HIGH (ref 70–99)
Glucose-Capillary: 157 mg/dL — ABNORMAL HIGH (ref 70–99)

## 2012-09-13 NOTE — Progress Notes (Signed)
D: Pt slept through shift until had to be woken up for HS medications.  When awake, Pt was pleasant and cooperative, took meds without complaint.  Affect blunted, mood anxious/depressed; unable to assess thought content or process due to brevity of interaction but no overt signs of psychosis noted.  Pt facial expression was flat with averted eye contact but responded to all questions and statements.  Pt did initially state that she wants to change her mattress because "the lady who coded is in mine" but topic was not pursued and did not come up again.  Pt denied SI/HI; UTA AVH as previously stated.  A: Pt given HS meds @2302  in bedroom along with Trazodone 100mg  PO PRN.  Came to med window approximately 45 minutes later to request Trazodone repeat; was informed of need to wait x 15 minutes, returned to bedroom without complaint.  Repeat Trazodone dose administered at 0107.  All medications administered according to med orders and POC.  Q15 minute safety checks maintained as per unit protocol.  R: Pt more cooperative than in recent days.  Safety maintained. Dion Saucier RN

## 2012-09-13 NOTE — Progress Notes (Signed)
Patient ID: [name], female   DOB: December 05, 1965, 45 y.o.   MRN: [id number] D: Pt. In group, limited interaction. Pt. Report to writer "sleep most of the day", "day okay". Pt. Sullen, and guarded. Pt. Also complains that dayroom smells of urine. A: Staff will monitor q7min for safety. Pt. Given options of having chair she sat in wiped down with germicidal disposable wipes or remain in room and magazines would be given.R: Pt. Is safe on the unit. Pt. Refused options given her "can't bring TV in here",  "who cares right"

## 2012-09-13 NOTE — Clinical Social Work Note (Signed)
  Type of Therapy: Process Group Therapy  Participation Level:  Did Not Attend  Participation Quality:  N/A Affect:  N/A  Cognitive:  N/A  Insight:  N/A  Engagement in Group:  N/A  Engagement in Therapy:  N/A  Modes of Intervention:  Activity, Clarification, Education, Problem-solving and Support  Summary of Progress/Problems: Today's group addressed the issue of overcoming obstacles.  Patients were asked to identify their biggest obstacle post d/c that stands in the way of their on-going success.  Keatin Benham B 09/13/2012, 3:52 PM  

## 2012-09-13 NOTE — Progress Notes (Signed)
Psychoeducational Group Note  Date:  09/13/2012 Time:  2000  Group Topic/Focus:  Wrap-Up Group:   The focus of this group is to help patients review their daily goal of treatment and discuss progress on daily workbooks.  Participation Level:  Active  Participation Quality:  Appropriate  Affect:  Appropriate  Cognitive:  Appropriate  Insight:  Good  Engagement in Group:  Good  Additional Comments:  Patient attended and participated in group tonight. She reports not attending any of her groups today because she was asleep all day. She advised that she did not have breakfast but went for lunch and dinner.  Lita Mains [facility] 09/13/2012, 8:39 PM

## 2012-09-13 NOTE — Progress Notes (Signed)
[facility] MD Progress Note  09/13/2012 11:36 AM [name]  MRN:  [id number]  Diagnosis:   Axis I: Major Depression, Recurrent severe and with psychotic features Axis II: Deferred Axis III:  Past Medical History  Diagnosis Date  . Anxiety   . Major depressive disorder, recurrent, severe with psychotic features    Axis IV: housing problems, occupational problems and problems with primary support group Axis V: 41-50 serious symptoms  Subjective:  "My mood is ok. Im not happy, but Im not in a bad mood. Im worried/anxious bc I dont have a place to live. I feel scared. I feel unstable."  ADL's:  Impaired still in hospital gown late AM.  Sleep: Fair "I wake up from 3am to 6 am, then I go back to sleep."  Appetite:  Good  Suicidal Ideation:  Plan:  intermittent suicidal ideation Intent:  none. Contracts for safety. Means:  none  Homicidal Ideation:  Plan:  denies Intent:  denies Means:  none  AEB (as evidenced by):  Mental Status Examination/Evaluation: Objective:  Appearance: Fairly Groomed  Eye Contact::  Minimal  Speech:  Normal Rate  Volume:  Decreased  Mood:  Depressed  Affect:  Blunt and Constricted  Thought Process:  Logical  Orientation:  Full  Thought Content:  WDL  Suicidal Thoughts:  Yes.  without intent/plan  Homicidal Thoughts:  No  Memory:  Immediate;   Fair Recent;   Fair Remote;   Fair  Judgement:  Impaired  Insight:  Lacking  Psychomotor Activity:  Normal  Concentration:  Fair  Recall:  Fair  Akathisia:  No  Handed:    AIMS (if indicated):     Assets:  Vocational/Educational  Sleep:  Number of Hours: 3.75    ROS: HEENT: +nasal stuffiness, - cough, - congestion All other sxs negative except Psych.   Vital Signs:Blood pressure 122/82, pulse 130, temperature 97.9 F (36.6 C), temperature source Oral, resp. rate 20, height 5' 4.75" (1.645 m), weight 107.049 kg (236 lb), last menstrual period 10/29/2011.  Current Medications: Current  Facility-Administered Medications  Medication Dose Route Frequency Provider Last Rate Last Dose  . acetaminophen (TYLENOL) tablet 650 mg  650 mg Oral Q6H PRN Shuvon Rankin, NP   650 mg at 09/08/12 1800  . alum & mag hydroxide-simeth (MAALOX/MYLANTA) 200-200-20 MG/5ML suspension 30 mL  30 mL Oral Q4H PRN Shuvon Rankin, NP      . benztropine (COGENTIN) tablet 1 mg  1 mg Oral BID Verne Spurr, PA-C   1 mg at 09/12/12 1538  . citalopram (CELEXA) tablet 20 mg  20 mg Oral Daily Mojeed Akintayo   20 mg at 09/12/12 1538  . diphenhydrAMINE (BENADRYL) injection 25 mg  25 mg Intravenous Once Larena Sox, MD      . divalproex (DEPAKOTE ER) 24 hr tablet 500 mg  500 mg Oral BID Mojeed Akintayo   500 mg at 09/12/12 1538  . hydrOXYzine (ATARAX/VISTARIL) tablet 25 mg  25 mg Oral Q6H PRN Jorje Guild, PA-C      . lisinopril (PRINIVIL,ZESTRIL) tablet 5 mg  5 mg Oral Daily Verne Spurr, PA-C   5 mg at 09/12/12 1538  . LORazepam (ATIVAN) injection 2 mg  2 mg Intramuscular Once Larena Sox, MD      . LORazepam (ATIVAN) tablet 1 mg  1 mg Oral Q6H PRN Verne Spurr, PA-C   1 mg at 09/12/12 0136  . magnesium hydroxide (MILK OF MAGNESIA) suspension 30 mL  30 mL Oral Daily PRN Shuvon  Rankin, NP      . OLANZapine zydis (ZYPREXA) disintegrating tablet 10 mg  10 mg Oral Q8H PRN Mojeed Akintayo      . OLANZapine zydis (ZYPREXA) disintegrating tablet 5 mg  5 mg Oral QHS Verne Spurr, PA-C   5 mg at 09/12/12 2300  . risperiDONE (RISPERDAL M-TABS) disintegrating tablet 3 mg  3 mg Oral QHS Verne Spurr, PA-C   3 mg at 09/12/12 2300  . risperiDONE (RISPERDAL M-TABS) disintegrating tablet 3 mg  3 mg Oral BH-q7a Verne Spurr, PA-C   3 mg at 09/12/12 1539  . traZODone (DESYREL) tablet 100 mg  100 mg Oral QHS PRN Nanine Means, NP   100 mg at 09/13/12 0107    Lab Results:  Results for orders placed during the hospital encounter of 08/28/12 (from the past 48 hour(s))  GLUCOSE, CAPILLARY     Status: Normal   Collection Time    09/11/12  5:05 PM      Component Value Range Comment   Glucose-Capillary 92  70 - 99 mg/dL   GLUCOSE, CAPILLARY     Status: Abnormal   Collection Time   09/12/12  5:52 AM      Component Value Range Comment   Glucose-Capillary 106 (*) 70 - 99 mg/dL   GLUCOSE, CAPILLARY     Status: Abnormal   Collection Time   09/12/12  5:15 PM      Component Value Range Comment   Glucose-Capillary 132 (*) 70 - 99 mg/dL    Comment 1 Notify RN      Comment 2 Documented in Chart     GLUCOSE, CAPILLARY     Status: Abnormal   Collection Time   09/13/12  6:12 AM      Component Value Range Comment   Glucose-Capillary 112 (*) 70 - 99 mg/dL     Physical Findings: AIMS: Facial and Oral Movements Muscles of Facial Expression: None, normal Lips and Perioral Area: None, normal Jaw: None, normal Tongue: None, normal, Extremity Movements Upper (arms, wrists, hands, fingers): None, normal Lower (legs, knees, ankles, toes): None, normal,  Trunk Movements Neck, shoulders, hips: None, normal,  Overall Severity Severity of abnormal movements (highest score from questions above): None, normal Incapacitation due to abnormal movements: None, normal Patient's awareness of abnormal movements (rate only patient's report): No Awareness,  Dental Status Current problems with teeth and/or dentures?: No Does patient usually wear dentures?: No  CIWA:  CIWA-Ar Total: 0  COWS:  COWS Total Score: 0   Treatment Plan Summary: Daily contact with patient to assess and evaluate symptoms and progress in treatment Medication management  Plan: Continue Current orders.  Awaiting [facility] placement for discharge.  Norval Gable FNP-BC 09/13/2012, 11:37 AM

## 2012-09-13 NOTE — Progress Notes (Addendum)
Pt b/p 89/63. Pt encouraged to drink Gatorade and elevate legs. Pt noncompliant being manipulative. Pt refused fluids when offered to help increase b/p. Pt did not elevate legs, pt observed in dayroom watching tv. B/p monitored.  B/p rechecked 108/76

## 2012-09-13 NOTE — Progress Notes (Signed)
D: Patient denies SI/HI/AVH. Patient denies pain and show no s/s of distress. Patient slept during 8 am med pass and received  meds at noon. Patient noncompliant with medications. Patient encouraged to take meds at scheduled times. Patient has poor hygiene. Depressed affect. Minimal interaction on milieu.  A: 15 minute checks performed routinely for safety. Medications administered as ordered per MD. Verbal support given. Patient encouraged to attend groups. Patient encouraged to shower daily. Shift assessment performed. R: Controlling. Depressed. Soft slow speech.

## 2012-09-14 LAB — GLUCOSE, CAPILLARY
Glucose-Capillary: 110 mg/dL — ABNORMAL HIGH (ref 70–99)
Glucose-Capillary: 114 mg/dL — ABNORMAL HIGH (ref 70–99)

## 2012-09-14 NOTE — Progress Notes (Signed)
Psychoeducational Group Note  Date:  09/14/2012 Time:  0930  Group Topic/Focus:  Rediscovering Joy:   The focus of this group is to explore various ways to relieve stress in a positive manner.  Participation Level: Did Not Attend  Participation Quality:  Not Applicable  Affect:  Not Applicable  Cognitive:  Not Applicable  Insight:  Not Applicable  Engagement in Group: Not Applicable  Additional Comments:  Pt was asleep and could not attend group this morning.  Olivia Pavelko E 09/14/2012, 1:31 PM

## 2012-09-14 NOTE — Progress Notes (Signed)
Seen and agreed. Dilan Fullenwider, MD 

## 2012-09-14 NOTE — Progress Notes (Signed)
Patient ID: [name], female   DOB: Oct 30, 1965, 46 y.o.   MRN: [id number] [facility] MD Progress Note  09/14/2012 1:56 PM Rozann Holts  MRN:  [id number]  Diagnosis: Diagnosis:  Axis I: Major Depression, Recurrent severe and with psychotic features  Axis II: Deferred  Axis III:  Past Medical History   Diagnosis   Obesity  Hypertension  DM2 new onset Date   Axis IV: housing problems, occupational problems and problems with primary support group  Axis V: 41-50 serious symptoms ADL's:  Impaired  Sleep: Fair  Appetite:  Fair patient is requesting Ensure, and appears to have gained significant amount of weight since admission.  Suicidal Ideation:  "on and off" no intent, no plan, no means Homicidal Ideation:  denies  AEB (as evidenced by): Subjective: [name] is still isolating in her room, still responding to internal stimulation, and appears to have gained weight.  She reports increased anxiety regarding where she will go from here, and continues to worry over housing, employment, and transportation. Mental Status Examination/Evaluation: Objective:  Appearance: Disheveled  Eye Contact::  Fair  Speech:  Normal Rate  Volume:  Normal  Mood:  Depressed, Dysphoric and Hopeless  Affect:  Congruent  Thought Process:  Disorganized  Orientation:  Other:    Thought Content:  Delusions of entitlement to gifts of housing and transportation  Suicidal Thoughts:  Yes.  without intent/plan  Homicidal Thoughts:  No  Memory:  Immediate;   Poor  Judgement:  Impaired  Insight:  Lacking  Psychomotor Activity:  Normal  Concentration:  Poor  Recall:  Poor  Akathisia:  No  Handed:  Right  AIMS (if indicated):     Assets:  Resilience  Sleep:  Number of Hours: 6    Vital Signs:Blood pressure 108/76, pulse 86, temperature 97.9 F (36.6 C), temperature source Oral, resp. rate 20, height 5' 4.75" (1.645 m), weight 107.049 kg (236 lb), last menstrual period 10/29/2011. Current  Medications: Current Facility-Administered Medications  Medication Dose Route Frequency Provider Last Rate Last Dose  . acetaminophen (TYLENOL) tablet 650 mg  650 mg Oral Q6H PRN Shuvon Rankin, NP   650 mg at 09/08/12 1800  . alum & mag hydroxide-simeth (MAALOX/MYLANTA) 200-200-20 MG/5ML suspension 30 mL  30 mL Oral Q4H PRN Shuvon Rankin, NP      . benztropine (COGENTIN) tablet 1 mg  1 mg Oral BID Verne Spurr, PA-C   1 mg at 09/14/12 1017  . citalopram (CELEXA) tablet 20 mg  20 mg Oral Daily Mojeed Akintayo   20 mg at 09/14/12 1017  . diphenhydrAMINE (BENADRYL) injection 25 mg  25 mg Intravenous Once Larena Sox, MD      . divalproex (DEPAKOTE ER) 24 hr tablet 500 mg  500 mg Oral BID Mojeed Akintayo   500 mg at 09/14/12 1019  . hydrOXYzine (ATARAX/VISTARIL) tablet 25 mg  25 mg Oral Q6H PRN Jorje Guild, PA-C      . lisinopril (PRINIVIL,ZESTRIL) tablet 5 mg  5 mg Oral Daily Verne Spurr, PA-C   5 mg at 09/14/12 1018  . LORazepam (ATIVAN) injection 2 mg  2 mg Intramuscular Once Larena Sox, MD      . LORazepam (ATIVAN) tablet 1 mg  1 mg Oral Q6H PRN Verne Spurr, PA-C   1 mg at 09/12/12 0136  . magnesium hydroxide (MILK OF MAGNESIA) suspension 30 mL  30 mL Oral Daily PRN Shuvon Rankin, NP      . OLANZapine zydis (ZYPREXA) disintegrating tablet 10 mg  10 mg Oral Q8H PRN Mojeed Akintayo      . OLANZapine zydis (ZYPREXA) disintegrating tablet 5 mg  5 mg Oral QHS Verne Spurr, PA-C   5 mg at 09/13/12 2134  . risperiDONE (RISPERDAL M-TABS) disintegrating tablet 3 mg  3 mg Oral QHS Verne Spurr, PA-C   3 mg at 09/13/12 2134  . risperiDONE (RISPERDAL M-TABS) disintegrating tablet 3 mg  3 mg Oral BH-q7a Verne Spurr, PA-C   3 mg at 09/14/12 1018  . traZODone (DESYREL) tablet 100 mg  100 mg Oral QHS PRN Nanine Means, NP   100 mg at 09/13/12 2135    Lab Results:  Results for orders placed during the hospital encounter of 08/28/12 (from the past 48 hour(s))  GLUCOSE, CAPILLARY     Status:  Abnormal   Collection Time   09/12/12  5:15 PM      Component Value Range Comment   Glucose-Capillary 132 (*) 70 - 99 mg/dL    Comment 1 Notify RN      Comment 2 Documented in Chart     GLUCOSE, CAPILLARY     Status: Abnormal   Collection Time   09/13/12  6:12 AM      Component Value Range Comment   Glucose-Capillary 112 (*) 70 - 99 mg/dL   GLUCOSE, CAPILLARY     Status: Abnormal   Collection Time   09/13/12  6:38 PM      Component Value Range Comment   Glucose-Capillary 157 (*) 70 - 99 mg/dL    Comment 1 Notify RN     GLUCOSE, CAPILLARY     Status: Abnormal   Collection Time   09/14/12  7:35 AM      Component Value Range Comment   Glucose-Capillary 110 (*) 70 - 99 mg/dL     Physical Findings: AIMS: Facial and Oral Movements Muscles of Facial Expression: None, normal Lips and Perioral Area: None, normal Jaw: None, normal Tongue: None, normal,Extremity Movements Upper (arms, wrists, hands, fingers): None, normal Lower (legs, knees, ankles, toes): None, normal, Trunk Movements Neck, shoulders, hips: None, normal, Overall Severity Severity of abnormal movements (highest score from questions above): None, normal Incapacitation due to abnormal movements: None, normal Patient's awareness of abnormal movements (rate only patient's report): No Awareness, Dental Status Current problems with teeth and/or dentures?: No Does patient usually wear dentures?: No  CIWA:  CIWA-Ar Total: 0  COWS:  COWS Total Score: 0   Treatment Plan Summary: Daily contact with patient to assess and evaluate symptoms and progress in treatment Medication management  Plan: 1. Will continue current plan of care with no changes at this time.  2. Will order Diabetic Educator for evaluation and recommendations. 3. Will continue to await placement at [facility]. [name]. Nesta Kimple PAC 09/14/2012, 1:56 PM

## 2012-09-14 NOTE — Progress Notes (Signed)
[facility] Group Notes:  (Counselor/Nursing/MHT/Case Management/Adjunct)  09/14/2012 12:46 PM  Type of Therapy: Process  Group Therapy  Participation Level:  Did Not Attend    [name] 09/14/2012, 12:46 PM 

## 2012-09-14 NOTE — Progress Notes (Signed)
Patient ID: [name], female   DOB: 20-Jan-1966, 46 y.o.   MRN: [id number] Patient has been lethargic and isolative to room today.  She has been compliant with her medications today.  She denies any SI/HI/AVH.  She now has an order for daily weights as it appears she may be gaining some weight since her admission.  Her order for ensure has been discontinued and reports have been passed along regarding excessive gatorade and ginger ale requested.  Patient has numerous cups in her rooms where she has been drinking excessive fluids.    Continue to monitor medication management and MD order.  Collaborate with treatment team members regarding patient's POC.  Safety checks continued every 15 minutes per protocol.  Patient's behavior has been appropriate; encourage and support patient.

## 2012-09-14 NOTE — Progress Notes (Signed)
The focus of this group is to help patients review their daily goal of treatment and discuss progress on daily workbooks. Pt attended the evening session, but was limited in her responses to questions and appeared unhappy to be there. Pt interrupted a peer toward the end of the session with "Is this thing over yet? Can I leave?"

## 2012-09-15 LAB — GLUCOSE, CAPILLARY

## 2012-09-15 NOTE — Clinical Social Work Note (Signed)
Pt attended morning aftercare planning group. She stated that she slept "okay" and just talked to the doctor. Pt feels that she is not ready to leave. Pt rates her depression as 1, anxiety as 0, with no SI/HI or auditory/visual hallucinations.

## 2012-09-15 NOTE — Clinical Social Work Note (Signed)
[facility] Group Notes:  (Counselor/Nursing/MHT/Case Management/Adjunct)  09/10/2012 12:00 PM  Type of Therapy:  Group Therapy  Participation Level:  Minimal  Participation Quality:  Intrusive and Resistant  Affect:  Angry  Cognitive:  Appropriate  Insight:  Limited  Engagement in Group:  Limited  Engagement in Therapy:  Limited  Modes of Intervention:  Education and Support  Summary of Progress/Problems: MHA: Patient was angry and upset when [facility] speaker began to tell his story about experiences with drug use and mental illness. Pt stated that she could not take another sad story and walked out of group. She came back briefly for one song when the [facility] speaker played his guitar, excused herself afterward, and left.    Smart, Heather N 09/10/2012, 12:00 PM

## 2012-09-15 NOTE — Progress Notes (Signed)
Psychoeducational Group Note  Date:  09/15/2012 Time:  1100  Group Topic/Focus:  Personal Choices and Values:   The focus of this group is to help patients assess and explore the importance of values in their lives, how their values affect their decisions, how they express their values and what opposes their expression.  Participation Level:  Active  Participation Quality:  Appropriate  Affect:  Appropriate  Cognitive:  Appropriate  Insight:  Good  Engagement in Group:  Good  Additional Comments:  none  Kirk Sampley M 09/15/2012, 5:34 PM

## 2012-09-15 NOTE — Progress Notes (Signed)
D: D: Patient denies SI/HI and auditory and visual hallucinations. The patient has a labile mood and affect. The patient rates her depression and hopelessness both a 1 out of 10 (1 low/10 high). The patient reports sleeping well and states that her appetite is good and her energy level is normal. The patient is having minimal interaction within the milieu and is unwilling to participate in groups and isolates to her room throughout the day.  A: Patient given emotional support from RN. Patient encouraged to come to staff with concerns and/or questions. Patient's medication routine continued. Patient's orders and plan of care reviewed.  R: Patient remains cooperative. Will continue to monitor patient q15 minutes for safety.

## 2012-09-15 NOTE — Treatment Plan (Signed)
Interdisciplinary Treatment Plan Update (Adult)  Date: 09/15/2012  Time Reviewed: 10:00 AM   Progress in Treatment: Attending groups: Yes Participating in groups: Yes Taking medication as prescribed: Yes Tolerating medication: Yes   Family/Significant other contact made: No  Patient understands diagnosis:  Yes Discussing patient identified problems/goals with staff:  Yes Medical problems stabilized or resolved:  Yes Denies suicidal/homicidal ideation: Yes  In tx team Issues/concerns per patient self-inventory:  Yes Other:  New problem(s) identified: N/A  Reason for Continuation of Hospitalization: Medication stabilization  Interventions implemented related to continuation of hospitalization: Today [name] was told that she is on the wait list at [facility].  Her response was that  She does not want to go there.  She agreed to attend groups, get dressed,  participate in the milieu, and call the shelter so she can secure a bed to get out of here.  Additional comments:  Estimated length of stay:1-3 days  Discharge Plan: Get into shelter, or transfer to [facility], whichever comes first  New goal(s): N/A  Review of initial/current patient goals per problem list:   1.  Goal(s): Eliminate SI  Met:  Yes  Target date:11/20  As evidenced UJ:[id number] report  2.  Goal (s): Stabilize mood by decreasing depression to self report of 3 or less and demonstrating decreased lability to the extent that there are no altercations with others  Met:  Yes  Target date:11/20  As evidenced by: [name] denies depression today, and she has had no further altercations  3.  Goal(s):Eliminate psychosis  Met:  Yes  Target date:11/20  As evidenced by:No evidence today of thought disorder  4.  Goal(s):  Met:  Yes  Target date:  As evidenced by:  Attendees: Patient:  [name]  09/15/2012 10:00 AM  Family:     Physician:  Thedore Mins 09/15/2012 10:00 AM   Nursing: Nestor Ramp   09/15/2012  10:00 AM   Clinical Social Worker:  Richelle Ito 09/15/2012 10:00 AM   Extender:  Verne Spurr PA 09/15/2012 10:00 AM   Other:     Other:     Other:     Other:      Scribe for Treatment Team:   Daryel Gerald B, 09/15/2012 10:00 AM

## 2012-09-15 NOTE — Progress Notes (Signed)
Patient ID: [name], female   DOB: [birth year], 46 y.o.   MRN: [id number] D: Patient in dayroom approach. Pt presented with  depressed, labile mood and flat affect. Pt attended evening wrap-up group and  interacted appropriately with peers. Cooperative with assessment. No acute distressed noted. Denies SI/HI/AV and pain. Pt encouraged to come to staff with any question or concerns  A: meet with pt 1:1. Medication administered as prescribed. safety has been maintained with Q15 minutes observation. Supported and encouragement provided to attend groups.  R: Patient remains safe. She is complaint with medications and group programming. Safety has been maintained Q15 and continue current POC.

## 2012-09-15 NOTE — Progress Notes (Signed)
Patient ID: [name], female   DOB: 1965-12-17, 46 y.o.   MRN: [id number] [facility] MD Progress Note  09/15/2012 10:36 AM [name]  MRN:  [id number]  Diagnosis: Diagnosis:  Axis I: Major Depression, Recurrent severe and with psychotic features  Axis II: Deferred  Axis III:  Past Medical History   Diagnosis   Obesity  Hypertension  DM2 new onset Date   Axis IV: housing problems, occupational problems and problems with primary support group  Axis V: 41-50 serious symptoms ADL's:  Impaired  Sleep: Fair  Appetite:  Fair patient is requesting Ensure, and appears to have gained significant amount of weight since admission.  Suicidal Ideation:  "on and off" no intent, no plan, no means Homicidal Ideation:  denies  AEB (as evidenced by): Subjective: [name] remains guarded, self isolating, socially withdrawn, not participating in groups. She say "I have a lot on my plate" but would not elaborate. Still responding to internal stimulation, observed talking to herself. Reports on and off suicidal thoughts but contract for safety.  She reports increased anxiety regarding where she will go from here, and continues to worry over housing, employment, and transportation when she gets out of the hospital.  Mental Status Examination/Evaluation: Objective:  Appearance: Disheveled  Patent attorney::  Fair  Speech:  Normal Rate  Volume:  Normal  Mood:   Dysphoric and Hopeless  Affect:  Congruent  Thought Process:  Disorganized  Orientation:  Other:    Thought Content:  Delusions of entitlement to gifts of housing and transportation  Suicidal Thoughts:  Yes.  without intent/plan  Homicidal Thoughts:  No  Memory:  Immediate;   Poor  Judgement:  Impaired  Insight:  Lacking  Psychomotor Activity:  Normal  Concentration:  Poor  Recall:  Poor  Akathisia:  No  Handed:  Right  AIMS (if indicated):     Assets:  Resilience  Sleep:  Number of Hours: 6    Vital Signs:Blood pressure 108/76,  pulse 86, temperature 97.9 F (36.6 C), temperature source Oral, resp. rate 20, height 5' 4.75" (1.645 m), weight 107.049 kg (236 lb), last menstrual period 10/29/2011. Current Medications: Current Facility-Administered Medications  Medication Dose Route Frequency Provider Last Rate Last Dose  . acetaminophen (TYLENOL) tablet 650 mg  650 mg Oral Q6H PRN Shuvon Rankin, NP   650 mg at 09/08/12 1800  . alum & mag hydroxide-simeth (MAALOX/MYLANTA) 200-200-20 MG/5ML suspension 30 mL  30 mL Oral Q4H PRN Shuvon Rankin, NP      . benztropine (COGENTIN) tablet 1 mg  1 mg Oral BID Verne Spurr, PA-C   1 mg at 09/14/12 1017  . citalopram (CELEXA) tablet 20 mg  20 mg Oral Daily Shevette Bess   20 mg at 09/14/12 1017  . diphenhydrAMINE (BENADRYL) injection 25 mg  25 mg Intravenous Once Larena Sox, MD      . divalproex (DEPAKOTE ER) 24 hr tablet 500 mg  500 mg Oral BID Kalyn Hofstra   500 mg at 09/14/12 1019  . hydrOXYzine (ATARAX/VISTARIL) tablet 25 mg  25 mg Oral Q6H PRN Jorje Guild, PA-C      . lisinopril (PRINIVIL,ZESTRIL) tablet 5 mg  5 mg Oral Daily Verne Spurr, PA-C   5 mg at 09/14/12 1018  . LORazepam (ATIVAN) injection 2 mg  2 mg Intramuscular Once Larena Sox, MD      . LORazepam (ATIVAN) tablet 1 mg  1 mg Oral Q6H PRN Verne Spurr, PA-C   1 mg at 09/12/12 0136  .  magnesium hydroxide (MILK OF MAGNESIA) suspension 30 mL  30 mL Oral Daily PRN Shuvon Rankin, NP      . OLANZapine zydis (ZYPREXA) disintegrating tablet 10 mg  10 mg Oral Q8H PRN Ladarrian Asencio      . OLANZapine zydis (ZYPREXA) disintegrating tablet 5 mg  5 mg Oral QHS Verne Spurr, PA-C   5 mg at 09/13/12 2134  . risperiDONE (RISPERDAL M-TABS) disintegrating tablet 3 mg  3 mg Oral QHS Verne Spurr, PA-C   3 mg at 09/13/12 2134  . risperiDONE (RISPERDAL M-TABS) disintegrating tablet 3 mg  3 mg Oral BH-q7a Verne Spurr, PA-C   3 mg at 09/14/12 1018  . traZODone (DESYREL) tablet 100 mg  100 mg Oral QHS PRN Nanine Means, NP    100 mg at 09/13/12 2135    Lab Results:  Results for orders placed during the hospital encounter of 08/28/12 (from the past 48 hour(s))  GLUCOSE, CAPILLARY     Status: Abnormal   Collection Time   09/12/12  5:15 PM      Component Value Range Comment   Glucose-Capillary 132 (*) 70 - 99 mg/dL    Comment 1 Notify RN      Comment 2 Documented in Chart     GLUCOSE, CAPILLARY     Status: Abnormal   Collection Time   09/13/12  6:12 AM      Component Value Range Comment   Glucose-Capillary 112 (*) 70 - 99 mg/dL   GLUCOSE, CAPILLARY     Status: Abnormal   Collection Time   09/13/12  6:38 PM      Component Value Range Comment   Glucose-Capillary 157 (*) 70 - 99 mg/dL    Comment 1 Notify RN     GLUCOSE, CAPILLARY     Status: Abnormal   Collection Time   09/14/12  7:35 AM      Component Value Range Comment   Glucose-Capillary 110 (*) 70 - 99 mg/dL     Physical Findings: AIMS: Facial and Oral Movements Muscles of Facial Expression: None, normal Lips and Perioral Area: None, normal Jaw: None, normal Tongue: None, normal,Extremity Movements Upper (arms, wrists, hands, fingers): None, normal Lower (legs, knees, ankles, toes): None, normal, Trunk Movements Neck, shoulders, hips: None, normal, Overall Severity Severity of abnormal movements (highest score from questions above): None, normal Incapacitation due to abnormal movements: None, normal Patient's awareness of abnormal movements (rate only patient's report): No Awareness, Dental Status Current problems with teeth and/or dentures?: No Does patient usually wear dentures?: No  CIWA:  CIWA-Ar Total: 0  COWS:  COWS Total Score: 0   Treatment Plan Summary: Daily contact with patient to assess and evaluate symptoms and progress in treatment Medication management  Plan: 1. Will continue current plan of care with no changes at this time.  2. Will order Diabetic Educator for evaluation and recommendations. 3. Will continue to await  placement at [facility].  Thedore Mins, MD 10:36 AM

## 2012-09-16 LAB — GLUCOSE, CAPILLARY: Glucose-Capillary: 116 mg/dL — ABNORMAL HIGH (ref 70–99)

## 2012-09-16 NOTE — Progress Notes (Signed)
Chaplain met with pt in response to spiritual care consult.    Pt was seated in day room.  Exhibited flat affect and was somewhat lethargic (closing eyes / looking down) throughout conversation.   Pt initially told chaplain "I'm ok", but proceeded to speak with chaplain about attempting to find housing for discharge.  Has been in contact with [facility] and reports she is waiting for a bed.  Hopeful that [employer] will hold bed until discharge.  Pt expressed some fear and uncertainty around not having job and housing.  Spoke with chaplain about "putting one foot in front of other."    Chaplain moved to explore with pt whether groups have been helpful.  Pt did not speak to helpfulness of milieu.     Chaplain provided emotional support and empathic presence throughout conversation.  Pt requested chaplain continue to pray for her.   Will continue to follow as needed.  Please page if needs arise.    Belva Crome  MDiv, Chaplain

## 2012-09-16 NOTE — Progress Notes (Signed)
D: D: Patient denies SI/HI and auditory and visual hallucinations. The patient has a labile mood and affect. The patient refused to complete her self-inventory form. The patient is having minimal interaction within the milieu and is unwilling to participate in groups and isolates to her room throughout the day. The patient states that she isn't going to groups because the staff don't give her "advance notice" and that she is "too sleepy from all the meds."  A: Patient given emotional support from RN. Patient encouraged to come to staff with concerns and/or questions. Patient's medication routine continued. Patient's orders and plan of care reviewed. Patient given education regarding the importance of attending groups.  R: Patient remains cooperative. Will continue to monitor patient q15 minutes for safety.

## 2012-09-16 NOTE — Progress Notes (Signed)
[facility] Group Notes:  (Counselor/Nursing/MHT/Case Management/Adjunct)  09/16/2012 4:18 PM  Type of Therapy:  Psychoeducational Skills  Participation Level:  Active  Participation Quality:  Attentive, Redirectable and Resistant  Affect:  Blunted  Cognitive:  Oriented  Insight:  Limited  Engagement in Group:  Limited  Engagement in Therapy:  n/a  Modes of Intervention:  Activity, Problem-solving, Socialization and Support  Summary of Progress/Problems: Laporshe attended a Psychoeducational group where patients were asked to play a sharing game. Leonetta stated that chicken casserole is favorite thanksgiving dish, that she lost her job in 2009 and is not grateful for anything her life, Clinical research associate asked her to try to think about something even if it is small to be grateful for, and that she would like to work on finding housing during this admission to the hospital. Jutta was quiet while group discussed engaging in gratitude activities and created a gratitude leaf to hang on the hall "tree".   Wandra Scot 09/16/2012, 4:18 PM

## 2012-09-16 NOTE — Clinical Social Work Note (Signed)
[facility] Group Notes:  (Counselor/Nursing/MHT/Case Management/Adjunct)  09/16/2012 2:27 PM   Type of Therapy:  Group Therapy  Participation Level:  Active  Participation Quality:  Appropriate and Attentive  Affect:  Appropriate  Cognitive:  Alert and Oriented  Insight:  Good  Engagement in Group:  Good  Engagement in Therapy:  Good  Modes of Intervention:  Activity, Education, Socialization and Support  Summary of Progress/Problems:Topic-Balance: The topic for group was balance in life. Pt participated in the discussion about when their life was in balance and out of balance and how this feels. Pt discussed ways to get back in balance and ways that they can achieve and maintain balance in life. Pt participated in group activity where members played the un-game. Pt talked about her feelings in talking about personal issues in a group setting. She offered advice and gave her blessing to other group members.   Smart, Heather N 09/09/2012, 2:34 PM

## 2012-09-16 NOTE — Clinical Social Work Note (Signed)
Pt did not attend morning aftercare planning group.  She was seen later, and she inquired about how she would have her blood sugars checked post d/c.  Told her about the nurse at the [facility].  She also requested a number for [facility] in [place].  Given.

## 2012-09-16 NOTE — Progress Notes (Signed)
Psychoeducational Group Note  Date:  09/16/2012 Time:  2000  Group Topic/Focus:  Karaoke   Participation Level:  Minimal  Participation Quality:  Appropriate  Affect:  Appropriate  Cognitive:  Appropriate  Insight:  Limited  Engagement in Group:  Limited  Additional Comments:  Pt attended karaoke this evening but pt didn't participated in group.   Kaili Castille A 09/16/2012, 11:59 PM

## 2012-09-16 NOTE — Progress Notes (Signed)
Patient ID: [name], femalePatient ID: [name], female   DOB: 07-Nov-1965, 46 y.o.   MRN: [id number] [facility] MD Progress Note  09/16/2012 09:30 AM [name]  MRN:  [id number]  Diagnosis: Diagnosis:  Axis I: Major Depression, Recurrent severe and with psychotic features  Axis II: Deferred  Axis III:  Past Medical History   Diagnosis   Obesity  Hypertension  DM2 new onset Date   Axis IV: housing problems, occupational problems and problems with primary support group  Axis V: 41-50  ADL's:  Impaired  Sleep: Fair  Appetite:  Fair patient is requesting Ensure, and appears to have gained significant amount of weight since admission.  Suicidal Ideation:  "on and off" no intent, no plan, no means Homicidal Ideation:  denies  AEB (as evidenced by): Subjective: [name] remains guarded, oppositional, defiant, self isolating, socially withdrawn, not participating in groups. She says "I have a lot on my plate" but would not elaborate.  She reports decreased anxiety and depressive symptoms. However, she  continues to worry over housing, employment, and transportation when she gets out of the hospital.  Mental Status Examination/Evaluation: Objective:  Appearance: casual  Eye Contact::  Fair  Speech:  Normal Rate  Volume:  Normal  Mood:   dysphoric  Affect:  neutral  Thought Process:  linear  Orientation:  Other:    Thought Content:  Fixed delusions of entitlement to gifts of housing and transportation  Suicidal Thoughts:  Fleeting but able to contract for safety  Homicidal Thoughts:  No  Memory:  Immediate;   fair  Judgement:  marginal  Insight:  Lacking  Psychomotor Activity:  Normal  Concentration: fair  Recall:  fair  Akathisia:  No  Handed:  Right  AIMS (if indicated):     Assets:  Resilience  Sleep:  Number of Hours: 6    Vital Signs:Blood pressure 108/76, pulse 86, temperature 97.9 F (36.6 C), temperature source Oral, resp. rate 20, height 5' 4.75" (1.645  m), weight 107.049 kg (236 lb), last menstrual period 10/29/2011. Current Medications: Current Facility-Administered Medications  Medication Dose Route Frequency Provider Last Rate Last Dose  . acetaminophen (TYLENOL) tablet 650 mg  650 mg Oral Q6H PRN Shuvon Rankin, NP   650 mg at 09/08/12 1800  . alum & mag hydroxide-simeth (MAALOX/MYLANTA) 200-200-20 MG/5ML suspension 30 mL  30 mL Oral Q4H PRN Shuvon Rankin, NP      . benztropine (COGENTIN) tablet 1 mg  1 mg Oral BID Verne Spurr, PA-C   1 mg at 09/14/12 1017  . citalopram (CELEXA) tablet 20 mg  20 mg Oral Daily Taaliyah Delpriore   20 mg at 09/14/12 1017  . diphenhydrAMINE (BENADRYL) injection 25 mg  25 mg Intravenous Once Larena Sox, MD      . divalproex (DEPAKOTE ER) 24 hr tablet 500 mg  500 mg Oral BID Vadie Principato   500 mg at 09/14/12 1019  . hydrOXYzine (ATARAX/VISTARIL) tablet 25 mg  25 mg Oral Q6H PRN Jorje Guild, PA-C      . lisinopril (PRINIVIL,ZESTRIL) tablet 5 mg  5 mg Oral Daily Verne Spurr, PA-C   5 mg at 09/14/12 1018  . LORazepam (ATIVAN) injection 2 mg  2 mg Intramuscular Once Larena Sox, MD      . LORazepam (ATIVAN) tablet 1 mg  1 mg Oral Q6H PRN Verne Spurr, PA-C   1 mg at 09/12/12 0136  . magnesium hydroxide (MILK OF MAGNESIA) suspension 30 mL  30 mL Oral Daily  PRN Shuvon Rankin, NP      . OLANZapine zydis (ZYPREXA) disintegrating tablet 10 mg  10 mg Oral Q8H PRN Sione Baumgarten      . OLANZapine zydis (ZYPREXA) disintegrating tablet 5 mg  5 mg Oral QHS Verne Spurr, PA-C   5 mg at 09/13/12 2134  . risperiDONE (RISPERDAL M-TABS) disintegrating tablet 3 mg  3 mg Oral QHS Verne Spurr, PA-C   3 mg at 09/13/12 2134  . risperiDONE (RISPERDAL M-TABS) disintegrating tablet 3 mg  3 mg Oral BH-q7a Verne Spurr, PA-C   3 mg at 09/14/12 1018  . traZODone (DESYREL) tablet 100 mg  100 mg Oral QHS PRN Nanine Means, NP   100 mg at 09/13/12 2135    Lab Results:  Results for orders placed during the hospital encounter  of 08/28/12 (from the past 48 hour(s))  GLUCOSE, CAPILLARY     Status: Abnormal   Collection Time   09/12/12  5:15 PM      Component Value Range Comment   Glucose-Capillary 132 (*) 70 - 99 mg/dL    Comment 1 Notify RN      Comment 2 Documented in Chart     GLUCOSE, CAPILLARY     Status: Abnormal   Collection Time   09/13/12  6:12 AM      Component Value Range Comment   Glucose-Capillary 112 (*) 70 - 99 mg/dL   GLUCOSE, CAPILLARY     Status: Abnormal   Collection Time   09/13/12  6:38 PM      Component Value Range Comment   Glucose-Capillary 157 (*) 70 - 99 mg/dL    Comment 1 Notify RN     GLUCOSE, CAPILLARY     Status: Abnormal   Collection Time   09/14/12  7:35 AM      Component Value Range Comment   Glucose-Capillary 110 (*) 70 - 99 mg/dL     Physical Findings: AIMS: Facial and Oral Movements Muscles of Facial Expression: None, normal Lips and Perioral Area: None, normal Jaw: None, normal Tongue: None, normal,Extremity Movements Upper (arms, wrists, hands, fingers): None, normal Lower (legs, knees, ankles, toes): None, normal, Trunk Movements Neck, shoulders, hips: None, normal, Overall Severity Severity of abnormal movements (highest score from questions above): None, normal Incapacitation due to abnormal movements: None, normal Patient's awareness of abnormal movements (rate only patient's report): No Awareness, Dental Status Current problems with teeth and/or dentures?: No Does patient usually wear dentures?: No  CIWA:  CIWA-Ar Total: 0  COWS:  COWS Total Score: 0   Treatment Plan Summary: Daily contact with patient to assess and evaluate symptoms and progress in treatment Medication management  Plan: 1. Will continue current plan of care with no changes at this time.  2. Will order Diabetic Educator for evaluation and recommendations. 3. Valproic acid level in am 3. Will continue to await placement at [facility].  Thedore Mins, MD 09:30 AM

## 2012-09-16 NOTE — Progress Notes (Signed)
Psychoeducational Group Note  Date:  09/16/2012 Time:  0930  Group Topic/Focus:  Rediscovering Joy:   The focus of this group is to explore various ways to relieve stress in a positive manner.  Participation Level: Did Not Attend  Participation Quality:  Not Applicable  Affect:  Not Applicable  Cognitive:  Not Applicable  Insight:  Not Applicable  Engagement in Group: Not Applicable  Additional Comments:  Pt refused to attend group this morning.  Asher Torpey E 09/16/2012, 11:41 AM

## 2012-09-17 LAB — GLUCOSE, CAPILLARY
Glucose-Capillary: 116 mg/dL — ABNORMAL HIGH (ref 70–99)
Glucose-Capillary: 118 mg/dL — ABNORMAL HIGH (ref 70–99)

## 2012-09-17 LAB — VALPROIC ACID LEVEL: Valproic Acid Lvl: 74.5 ug/mL (ref 50.0–100.0)

## 2012-09-17 NOTE — Clinical Social Work Note (Signed)
[facility] Group Notes:  (Counselor/Nursing/MHT/Case Management/Adjunct)  09/10/2012 12:00 PM  Type of Therapy:  Group Therapy  Participation Level:  Minimal  Participation Quality:  Resistant  Affect:  Anxious and Depressed  Cognitive:  Appropriate and Oriented  Insight:  Good  Engagement in Group:  Limited  Engagement in Therapy:  Limited  Modes of Intervention:  Activity, Socialization and Support  Summary of Progress/Problems: Processing group activity: Continuation of the un-game. Pt minimally participated in game, answered some questions when her turn came up, but was resistant to talking beyond superficial level of questions but listened to others throughout session. Spoke with pt after group to make sure she was feeling well and pt informed SW intern that she felt antsy and felt the need to walk the halls to let out anxiety and extra energy. Pt was pleasant and grateful for concern by staff.   Smart, Heather N 09/10/2012, 12:00 PM

## 2012-09-17 NOTE — Progress Notes (Signed)
Patient ID: [name], femalePatient ID: [name], female   DOB: 06-20-1966, 46 y.o.   MRN: [id number] [facility] MD Progress Note  09/17/2012 10:25 AM Mailen Newborn  MRN:  [id number]  Diagnosis: Diagnosis:  Axis I: Major Depression, Recurrent severe and with psychotic features  Axis II: Deferred  Axis III:  Past Medical History   Diagnosis   Obesity  Hypertension  DM2 new onset Date   Axis IV: housing problems, occupational problems and problems with primary support group  Axis V: 41-50  ADL's:  Impaired  Sleep: Fair  Appetite:  Fair patient is requesting Ensure, and appears to have gained significant amount of weight since admission.  Suicidal Ideation:  "on and off" no intent, no plan, no means Homicidal Ideation:  denies  AEB (as evidenced by): Subjective: Patient remains delusional, reportedly insisted on sleeping in the day room last night because she thoughts somebody was standing outside her room's window. However, she  reports decreasing depressive symptom. She is interracting more with peers and has started to attend some groups therapy. Patient is willing to be paced in the shelter if her symptoms cleared up. However, she  continues to worry over housing, employment, and transportation when she gets out of the hospital.  Mental Status Examination/Evaluation: Objective:  Appearance: casual  Eye Contact::  Fair  Speech:  Normal Rate  Volume:  Normal  Mood:   dysphoric  Affect:  neutral  Thought Process:  linear  Orientation:  Other:    Thought Content:  Fixed delusions   Suicidal Thoughts:  Fleeting but able to contract for safety  Homicidal Thoughts:  No  Memory:  Immediate;   fair  Judgement:  marginal  Insight:  Lacking  Psychomotor Activity:  Normal  Concentration: fair  Recall:  fair  Akathisia:  No  Handed:  Right  AIMS (if indicated):     Assets:  Resilience  Sleep:  Number of Hours: 4.25   Vital Signs:Blood pressure 108/76, pulse 86,  temperature 97.9 F (36.6 C), temperature source Oral, resp. rate 20, height 5' 4.75" (1.645 m), weight 107.049 kg (236 lb), last menstrual period 10/29/2011. Current Medications: Current Facility-Administered Medications  Medication Dose Route Frequency Provider Last Rate Last Dose  . acetaminophen (TYLENOL) tablet 650 mg  650 mg Oral Q6H PRN Shuvon Rankin, NP   650 mg at 09/08/12 1800  . alum & mag hydroxide-simeth (MAALOX/MYLANTA) 200-200-20 MG/5ML suspension 30 mL  30 mL Oral Q4H PRN Shuvon Rankin, NP      . benztropine (COGENTIN) tablet 1 mg  1 mg Oral BID Verne Spurr, PA-C   1 mg at 09/14/12 1017  . citalopram (CELEXA) tablet 20 mg  20 mg Oral Daily Aldeen Riga   20 mg at 09/14/12 1017  . diphenhydrAMINE (BENADRYL) injection 25 mg  25 mg Intravenous Once Larena Sox, MD      . divalproex (DEPAKOTE ER) 24 hr tablet 500 mg  500 mg Oral BID Leland Staszewski   500 mg at 09/14/12 1019  . hydrOXYzine (ATARAX/VISTARIL) tablet 25 mg  25 mg Oral Q6H PRN Jorje Guild, PA-C      . lisinopril (PRINIVIL,ZESTRIL) tablet 5 mg  5 mg Oral Daily Verne Spurr, PA-C   5 mg at 09/14/12 1018  . LORazepam (ATIVAN) injection 2 mg  2 mg Intramuscular Once Larena Sox, MD      . LORazepam (ATIVAN) tablet 1 mg  1 mg Oral Q6H PRN Verne Spurr, PA-C   1 mg at 09/12/12  0136  . magnesium hydroxide (MILK OF MAGNESIA) suspension 30 mL  30 mL Oral Daily PRN Shuvon Rankin, NP      . OLANZapine zydis (ZYPREXA) disintegrating tablet 10 mg  10 mg Oral Q8H PRN Jailin Moomaw      . OLANZapine zydis (ZYPREXA) disintegrating tablet 5 mg  5 mg Oral QHS Verne Spurr, PA-C   5 mg at 09/13/12 2134  . risperiDONE (RISPERDAL M-TABS) disintegrating tablet 3 mg  3 mg Oral QHS Verne Spurr, PA-C   3 mg at 09/13/12 2134  . risperiDONE (RISPERDAL M-TABS) disintegrating tablet 3 mg  3 mg Oral BH-q7a Verne Spurr, PA-C   3 mg at 09/14/12 1018  . traZODone (DESYREL) tablet 100 mg  100 mg Oral QHS PRN Nanine Means, NP   100 mg  at 09/13/12 2135    Lab Results:  Results for orders placed during the hospital encounter of 08/28/12 (from the past 48 hour(s))  GLUCOSE, CAPILLARY     Status: Abnormal   Collection Time   09/12/12  5:15 PM      Component Value Range Comment   Glucose-Capillary 132 (*) 70 - 99 mg/dL    Comment 1 Notify RN      Comment 2 Documented in Chart     GLUCOSE, CAPILLARY     Status: Abnormal   Collection Time   09/13/12  6:12 AM      Component Value Range Comment   Glucose-Capillary 112 (*) 70 - 99 mg/dL   GLUCOSE, CAPILLARY     Status: Abnormal   Collection Time   09/13/12  6:38 PM      Component Value Range Comment   Glucose-Capillary 157 (*) 70 - 99 mg/dL    Comment 1 Notify RN     GLUCOSE, CAPILLARY     Status: Abnormal   Collection Time   09/14/12  7:35 AM      Component Value Range Comment   Glucose-Capillary 110 (*) 70 - 99 mg/dL     Physical Findings: AIMS: Facial and Oral Movements Muscles of Facial Expression: None, normal Lips and Perioral Area: None, normal Jaw: None, normal Tongue: None, normal,Extremity Movements Upper (arms, wrists, hands, fingers): None, normal Lower (legs, knees, ankles, toes): None, normal, Trunk Movements Neck, shoulders, hips: None, normal, Overall Severity Severity of abnormal movements (highest score from questions above): None, normal Incapacitation due to abnormal movements: None, normal Patient's awareness of abnormal movements (rate only patient's report): No Awareness, Dental Status Current problems with teeth and/or dentures?: No Does patient usually wear dentures?: No  CIWA:  CIWA-Ar Total: 0  COWS:  COWS Total Score: 0   Treatment Plan Summary: Daily contact with patient to assess and evaluate symptoms and progress in treatment Medication management  Plan: 1. Will continue current plan of care with no changes at this time.  2. Will follow up Valproic acid level 3. Will continue to await placement at [facility].  Thedore Mins,  MD 10:25 AM

## 2012-09-17 NOTE — Progress Notes (Signed)
D: D: Patient denies SI/HI and auditory and visual hallucinations. The patient has a labile mood and affect. The patient refused to complete her self-inventory form. The patient did reports that she was having trouble sleeping. She stated that she couldn't sleep in her room because "someone was outside the window waiting to shoot her." The patient is having minimal interaction within the milieu and is unwilling to participate in groups and isolates to her room throughout the day.  A: Patient given emotional support from RN. Patient encouraged to come to staff with concerns and/or questions. Patient's medication routine continued. Patient's orders and plan of care reviewed. Patient given education regarding the importance of attending groups.  R: Patient remains cooperative. Will continue to monitor patient q15 minutes for safety.

## 2012-09-17 NOTE — Progress Notes (Signed)
D  Pt attended group and has been cooperative and pleasant   She complained of constipation and received medication for same   She took her medications without difficulty  She denies hearing voices and denies suicidal and homicidal ideation A   Verbal support given   Medications administered and effectiveness monitored   Q 15 min checks R  Pt safe at present

## 2012-09-18 LAB — GLUCOSE, CAPILLARY

## 2012-09-18 NOTE — Progress Notes (Signed)
Psychoeducational Group Note  Date:  09/18/2012 Time:  0945 am  Group Topic/Focus:  Identifying Needs:   The focus of this group is to help patients identify their personal needs that have been historically problematic and identify healthy behaviors to address their needs.  Participation Level:  Minimal  Participation Quality:  Resistant  Affect:  Depressed  Cognitive:  Alert  Insight:  Limited  Engagement in Group:  Limited  Additional Comments:    Andrena Mews 09/18/2012,11:52 AM

## 2012-09-18 NOTE — Progress Notes (Signed)
Patient ID: [name], female   DOB: 01/01/1966, 46 y.o.   MRN: [id number] D. The patient slept in her chair in her room the early part of the evening. Several attempts were made to wake her up to go to group. When group was over was able to get up and go to the dayroom for snack. After eating a large snack request a dinner because she slept through dinner and had not eaten. Requested to sleep in the dayroom tonight because she felt that there are people who are out to get her and they might look in her window and see her. A. Met with the patient 1:1 to assess. Medications reviewed and administered. The patient was assured of her safety at [facility] and that she would not be able to sleep in the dayroom. However, the quiet room would be available if she needed it. R. The patient was compliant with medications tonight. She initially was going to sleep in the quiet room, but after pacing the hall a couple of times decided to return to her room. Was observed resting with her eyes closed soon after returning to her room.

## 2012-09-18 NOTE — Progress Notes (Signed)
[facility] Group Notes:  (Counselor/Nursing/MHT/Case Management/Adjunct)  09/18/2012 10:30 AM  Type of Therapy self inventory  Participation Level:  Did Not Attend  Summary of Progress/Problems:   [name] 09/18/2012, 10:30 AM

## 2012-09-18 NOTE — Progress Notes (Signed)
Met with pt 1:1 privately who initially was guarded. Pt then began speaking about "the people who are out to get me." "They are outside of my window sometimes at night." When asked if she sees or hears them pt responds "no, but I know they are there. I can sense them." Pt then referred to another group of people "who are good and I wish they would bring me a check so I can have a place to stay." Pt also making references as she has earlier in this admission to "wanting to punch someone - I'm just so angry I feel like acting out." Pt given support and reminded her that acting out would not be tolerated on the unit. Reassured her of her safety while at [facility]. Pt able to contract for safety and states she will not be physically aggressive. Denies SI/AVH. Lawrence Marseilles

## 2012-09-18 NOTE — Progress Notes (Signed)
D   Pt initially did not want to take her medications but did agree to take them   She reports feeling very sleepy  She attended group but did not participate   She appears anxious and does attempt to intellectualize her lack of participation and socialization   She denies voices and denies suicidal and homicidal ideation    Her thinking is more logical and clearly improved   Her mood is less labile and anxious  She remains paranoid but less so A   Verbal support given  Medications administered and effectiveness monitored   Q 15 min checks  Continue to reinforce medication compliance R   Pt safe at present  And verbalized understanding of need for medication compliance

## 2012-09-18 NOTE — Progress Notes (Addendum)
Patient ID: [name], female   DOB: July 01, 1966, 46 y.o.   MRN: [id number] Patient ID: [name], femalePatient ID: [name], female   DOB: [birth year], 46 y.o.   MRN: [id number] [facility] MD Progress Note    Diagnosis: Diagnosis:  Axis I: Major Depression, Recurrent severe and with psychotic features  Axis II: Deferred  Axis III:  Past Medical History   Diagnosis   Obesity  Hypertension  DM2 new onset Date   Axis IV: housing problems, occupational problems and problems with primary support group  Axis V: 41-50  ADL's:  Impaired  Sleep: Fair  Appetite:  Fair   Suicidal Ideation:  "on and off" no intent, no plan, no means Homicidal Ideation:  denies  AEB (as evidenced by):  Subjective: thinks she is not ready to leave now. Sleepy on bed now. Reports fair sleep. Not sure how exactly she is doing now. Willing to consider shelter in future.  Mental Status Examination/Evaluation: Objective:  Appearance: casual  Eye Contact::  Fair  Speech:  Normal Rate  Volume:  Normal  Mood:   ok  Affect:  constricted  Thought Process:  Linear most of the time  Orientation:  Other:    Thought Content:  Fixed delusions   Suicidal Thoughts:  Fleeting but able to contract for safety  Homicidal Thoughts:  No  Memory:  Immediate;   fair  Judgement:  marginal  Insight:  Lacking  Psychomotor Activity:  Normal  Concentration: fair  Recall:  fair  Akathisia:  No  Handed:  Right  AIMS (if indicated):     Assets:  Resilience  Sleep:  Number of Hours: 4.25   Vital Signs:Blood pressure 108/76, pulse 86, temperature 97.9 F (36.6 C), temperature source Oral, resp. rate 20, height 5' 4.75" (1.645 m), weight 107.049 kg (236 lb), last menstrual period 10/29/2011. Current Medications: Current Facility-Administered Medications  Medication Dose Route Frequency Provider Last Rate Last Dose  . acetaminophen (TYLENOL) tablet 650 mg  650 mg Oral Q6H PRN Shuvon Rankin, NP   650 mg at 09/08/12 1800    . alum & mag hydroxide-simeth (MAALOX/MYLANTA) 200-200-20 MG/5ML suspension 30 mL  30 mL Oral Q4H PRN Shuvon Rankin, NP      . benztropine (COGENTIN) tablet 1 mg  1 mg Oral BID Verne Spurr, PA-C   1 mg at 09/14/12 1017  . citalopram (CELEXA) tablet 20 mg  20 mg Oral Daily Mojeed Akintayo   20 mg at 09/14/12 1017  . diphenhydrAMINE (BENADRYL) injection 25 mg  25 mg Intravenous Once Larena Sox, MD      . divalproex (DEPAKOTE ER) 24 hr tablet 500 mg  500 mg Oral BID Mojeed Akintayo   500 mg at 09/14/12 1019  . hydrOXYzine (ATARAX/VISTARIL) tablet 25 mg  25 mg Oral Q6H PRN Jorje Guild, PA-C      . lisinopril (PRINIVIL,ZESTRIL) tablet 5 mg  5 mg Oral Daily Verne Spurr, PA-C   5 mg at 09/14/12 1018  . LORazepam (ATIVAN) injection 2 mg  2 mg Intramuscular Once Larena Sox, MD      . LORazepam (ATIVAN) tablet 1 mg  1 mg Oral Q6H PRN Verne Spurr, PA-C   1 mg at 09/12/12 0136  . magnesium hydroxide (MILK OF MAGNESIA) suspension 30 mL  30 mL Oral Daily PRN Shuvon Rankin, NP      . OLANZapine zydis (ZYPREXA) disintegrating tablet 10 mg  10 mg Oral Q8H PRN Mojeed Akintayo      . OLANZapine  zydis (ZYPREXA) disintegrating tablet 5 mg  5 mg Oral QHS Verne Spurr, PA-C   5 mg at 09/13/12 2134  . risperiDONE (RISPERDAL M-TABS) disintegrating tablet 3 mg  3 mg Oral QHS Verne Spurr, PA-C   3 mg at 09/13/12 2134  . risperiDONE (RISPERDAL M-TABS) disintegrating tablet 3 mg  3 mg Oral BH-q7a Verne Spurr, PA-C   3 mg at 09/14/12 1018  . traZODone (DESYREL) tablet 100 mg  100 mg Oral QHS PRN Nanine Means, NP   100 mg at 09/13/12 2135    Lab Results:  Results for orders placed during the hospital encounter of 08/28/12 (from the past 48 hour(s))  GLUCOSE, CAPILLARY     Status: Abnormal   Collection Time   09/12/12  5:15 PM      Component Value Range Comment   Glucose-Capillary 132 (*) 70 - 99 mg/dL    Comment 1 Notify RN      Comment 2 Documented in Chart     GLUCOSE, CAPILLARY     Status:  Abnormal   Collection Time   09/13/12  6:12 AM      Component Value Range Comment   Glucose-Capillary 112 (*) 70 - 99 mg/dL   GLUCOSE, CAPILLARY     Status: Abnormal   Collection Time   09/13/12  6:38 PM      Component Value Range Comment   Glucose-Capillary 157 (*) 70 - 99 mg/dL    Comment 1 Notify RN     GLUCOSE, CAPILLARY     Status: Abnormal   Collection Time   09/14/12  7:35 AM      Component Value Range Comment   Glucose-Capillary 110 (*) 70 - 99 mg/dL     Physical Findings: AIMS: Facial and Oral Movements Muscles of Facial Expression: None, normal Lips and Perioral Area: None, normal Jaw: None, normal Tongue: None, normal,Extremity Movements Upper (arms, wrists, hands, fingers): None, normal Lower (legs, knees, ankles, toes): None, normal, Trunk Movements Neck, shoulders, hips: None, normal, Overall Severity Severity of abnormal movements (highest score from questions above): None, normal Incapacitation due to abnormal movements: None, normal Patient's awareness of abnormal movements (rate only patient's report): No Awareness, Dental Status Current problems with teeth and/or dentures?: No Does patient usually wear dentures?: No  CIWA:  CIWA-Ar Total: 0  COWS:  COWS Total Score: 0   Treatment Plan Summary: Daily contact with patient to assess and evaluate symptoms and progress in treatment Medication management  Plan: 1. Will continue current meds now 2.  Valproic acid level was reviewed 3. Will continue to await placement at [facility].

## 2012-09-18 NOTE — Progress Notes (Signed)
Psychoeducational Group Note  Date:  09/18/2012 Time:  2000 Group Topic/Focus:  Wrap-Up Group:   The focus of this group is to help patients review their daily goal of treatment and discuss progress on daily workbooks.  Participation Level:  Did Not Attend  Participation Quality:    Affect:    Cognitive:    Insight:    Engagement in Group:    Additional Comments:  Pt didn't attend  wrap-up group this evening. Pt was sleeping.   Tarin Johndrow A 09/18/2012, 2:39 AM

## 2012-09-18 NOTE — Clinical Social Work Note (Signed)
[facility] Group Notes:  (Clinical Social Work)  09/18/2012  11:15-11:45AM  Summary of Progress/Problems:   The main focus of today's process group was for the patient to identify ways in which they have in the past sabotaged their own recovery and reasons they may have done this/what they received from doing it.  We then worked to identify a specific plan to avoid doing this when discharged from the hospital for this admission.  The patient expressed that she has sabotaged herself in the past by yelling at a boss when she felt mistreated.  The patient was very sedated throughout group and had to be awakened several times.   Even when talking, her voice would drop off to a whisper and she would nod off.  CSW asked the techs to help her return to her room to rest.  Type of Therapy:  Group Therapy - Process  Participation Level:  Minimal  Participation Quality:  Drowsy  Affect:  Flat  Cognitive:  Oriented  Insight:  Limited  Engagement in Group:  None  Engagement in Therapy:  Limited  Modes of Intervention:  Clarification, Education, Limit-setting, Problem-solving, Socialization, Support and Processing   Ambrose Mantle, LCSW 09/18/2012, 11:46 AM

## 2012-09-19 LAB — GLUCOSE, CAPILLARY
Glucose-Capillary: 105 mg/dL — ABNORMAL HIGH (ref 70–99)
Glucose-Capillary: 115 mg/dL — ABNORMAL HIGH (ref 70–99)

## 2012-09-19 NOTE — Progress Notes (Signed)
Pt reports having a much better day today because she received "good news from my sister." "She's going to bring me money. I'm not sure how much but any amount will be well received." Interaction remains superficial. Affect and mood are anxious at this time. Pt still ambivalent about discharge plan as she does not wish to be transferred to a higher level of care but also acknowledges options for housing are not satisfactory. Pt given support and encouragement. She denies SI/HI/AVH at present and no outbursts of internal stimuli observed. Lawrence Marseilles

## 2012-09-19 NOTE — Progress Notes (Signed)
Psychoeducational Group Note  Date:  09/19/2012 Time: 1000  Group Topic/Focus:  Spirituality:   The focus of this group is to discuss how one's spirituality can aide in recovery.  Participation Level:  Did Not Attend  Participation Quality:    Affect:    Cognitive:    Insight:    Engagement in Group:    Additional Comments:    [name] 09/19/2012, 5:58 PM

## 2012-09-19 NOTE — Progress Notes (Signed)
Patient ID: [name], female   DOB: May 19, 1966, 46 y.o.   MRN: [id number] [facility] MD Progress Note    Diagnosis: Diagnosis:  Axis I: Major Depression, Recurrent severe and with psychotic features  Axis II: Deferred  Axis III:  Past Medical History   Diagnosis   Obesity  Hypertension  DM2 new onset Date   Axis IV: housing problems, occupational problems and problems with primary support group  Axis V: 41-50  ADL's:  Impaired  Sleep: Fair  Appetite:  Fair   Suicidal Ideation:  None today Homicidal Ideation:  denies  AEB (as evidenced by):  Subjective:  Reports fair sleep. Thinks she is getting better now. Feels not ready for discharge now. dont want to go to [facility].  Mental Status Examination/Evaluation: Objective:  Appearance: casual  Eye Contact::  Fair  Speech:  Normal Rate  Volume:  Normal  Mood:   ok  Affect:  constricted  Thought Process:  Linear most of the time  Orientation:  Other:    Thought Content:  Fixed delusions   Suicidal Thoughts:  denies  Homicidal Thoughts:  No  Memory:  Immediate;   fair  Judgement:  marginal  Insight:  Lacking  Psychomotor Activity:  Normal  Concentration: fair  Recall:  fair  Akathisia:  No  Handed:  Right  AIMS (if indicated):     Assets:  Resilience  Sleep:  Number of Hours: 4.25   Vital Signs:Blood pressure 108/76, pulse 86, temperature 97.9 F (36.6 C), temperature source Oral, resp. rate 20, height 5' 4.75" (1.645 m), weight 107.049 kg (236 lb), last menstrual period 10/29/2011. Current Medications: Current Facility-Administered Medications  Medication Dose Route Frequency Provider Last Rate Last Dose  . acetaminophen (TYLENOL) tablet 650 mg  650 mg Oral Q6H PRN Shuvon Rankin, NP   650 mg at 09/08/12 1800  . alum & mag hydroxide-simeth (MAALOX/MYLANTA) 200-200-20 MG/5ML suspension 30 mL  30 mL Oral Q4H PRN Shuvon Rankin, NP      . benztropine (COGENTIN) tablet 1 mg  1 mg Oral BID Verne Spurr, PA-C   1 mg at  09/14/12 1017  . citalopram (CELEXA) tablet 20 mg  20 mg Oral Daily Mojeed Akintayo   20 mg at 09/14/12 1017  . diphenhydrAMINE (BENADRYL) injection 25 mg  25 mg Intravenous Once Larena Sox, MD      . divalproex (DEPAKOTE ER) 24 hr tablet 500 mg  500 mg Oral BID Mojeed Akintayo   500 mg at 09/14/12 1019  . hydrOXYzine (ATARAX/VISTARIL) tablet 25 mg  25 mg Oral Q6H PRN Jorje Guild, PA-C      . lisinopril (PRINIVIL,ZESTRIL) tablet 5 mg  5 mg Oral Daily Verne Spurr, PA-C   5 mg at 09/14/12 1018  . LORazepam (ATIVAN) injection 2 mg  2 mg Intramuscular Once Larena Sox, MD      . LORazepam (ATIVAN) tablet 1 mg  1 mg Oral Q6H PRN Verne Spurr, PA-C   1 mg at 09/12/12 0136  . magnesium hydroxide (MILK OF MAGNESIA) suspension 30 mL  30 mL Oral Daily PRN Shuvon Rankin, NP      . OLANZapine zydis (ZYPREXA) disintegrating tablet 10 mg  10 mg Oral Q8H PRN Mojeed Akintayo      . OLANZapine zydis (ZYPREXA) disintegrating tablet 5 mg  5 mg Oral QHS Verne Spurr, PA-C   5 mg at 09/13/12 2134  . risperiDONE (RISPERDAL M-TABS) disintegrating tablet 3 mg  3 mg Oral QHS Verne Spurr, PA-C  3 mg at 09/13/12 2134  . risperiDONE (RISPERDAL M-TABS) disintegrating tablet 3 mg  3 mg Oral BH-q7a Verne Spurr, PA-C   3 mg at 09/14/12 1018  . traZODone (DESYREL) tablet 100 mg  100 mg Oral QHS PRN Nanine Means, NP   100 mg at 09/13/12 2135    Lab Results:  Results for orders placed during the hospital encounter of 08/28/12 (from the past 48 hour(s))  GLUCOSE, CAPILLARY     Status: Abnormal   Collection Time   09/12/12  5:15 PM      Component Value Range Comment   Glucose-Capillary 132 (*) 70 - 99 mg/dL    Comment 1 Notify RN      Comment 2 Documented in Chart     GLUCOSE, CAPILLARY     Status: Abnormal   Collection Time   09/13/12  6:12 AM      Component Value Range Comment   Glucose-Capillary 112 (*) 70 - 99 mg/dL   GLUCOSE, CAPILLARY     Status: Abnormal   Collection Time   09/13/12  6:38 PM       Component Value Range Comment   Glucose-Capillary 157 (*) 70 - 99 mg/dL    Comment 1 Notify RN     GLUCOSE, CAPILLARY     Status: Abnormal   Collection Time   09/14/12  7:35 AM      Component Value Range Comment   Glucose-Capillary 110 (*) 70 - 99 mg/dL     Physical Findings: AIMS: Facial and Oral Movements Muscles of Facial Expression: None, normal Lips and Perioral Area: None, normal Jaw: None, normal Tongue: None, normal,Extremity Movements Upper (arms, wrists, hands, fingers): None, normal Lower (legs, knees, ankles, toes): None, normal, Trunk Movements Neck, shoulders, hips: None, normal, Overall Severity Severity of abnormal movements (highest score from questions above): None, normal Incapacitation due to abnormal movements: None, normal Patient's awareness of abnormal movements (rate only patient's report): No Awareness, Dental Status Current problems with teeth and/or dentures?: No Does patient usually wear dentures?: No  CIWA:  CIWA-Ar Total: 0  COWS:  COWS Total Score: 0   Treatment Plan Summary: Daily contact with patient to assess and evaluate symptoms and progress in treatment Medication management  Plan:  1. Will continue current meds now  2. Will continue to await placement at [facility].

## 2012-09-19 NOTE — Clinical Social Work Note (Signed)
[facility] Group Notes:  (Clinical Social Work)  09/19/2012   11:15-11:45AM  Summary of Progress/Problems:  The main focus of today's process group was for the patient to define "support" and describe what healthy supports are, then to identify the patient's current support system and decide on other supports that can be put in place to prevent future hospitalizations.   An emphasis was placed on using therapist, doctor and problem-specific support groups to expand supports. The patient expressed that she herself is a good support.  Also, she has her family, the [employer] of the [employer], and supports in the community.  She was not present until toward the end of group.  Type of Therapy:  Group Therapy  Participation Level:  Minimal  Participation Quality:  Appropriate and Attentive  Affect:  Blunted  Cognitive:  Alert, Appropriate and Oriented  Insight:  Good  Engagement in Group:  Good  Engagement in Therapy:  Good  Modes of Intervention:  Clarification, Education, Limit-setting, Problem-solving, Socialization, Support and Processing   Ambrose Mantle, LCSW 09/19/2012,

## 2012-09-19 NOTE — Progress Notes (Signed)
[facility] Group Notes:  (Counselor/Nursing/MHT/Case Management/Adjunct)  09/19/2012 9:00 PM  Type of Therapy:  Psychoeducational Skills  Participation Level:  Minimal  Participation Quality:  Inattentive  Affect:  Excited  Cognitive:  Appropriate  Insight:  Limited  Engagement in Group:  Limited  Engagement in Therapy:  Limited  Modes of Intervention:  Education  Summary of Progress/Problems: The patient described her day as being "extraordinary". She went on to say that she is waiting for her sister to drop off some money at the hospital. She did not go into further detail regarding her day. Her goal for tomorrow is to obtain money from her sister.    Aycen Porreca S 09/19/2012, 9:00 PM

## 2012-09-19 NOTE — Progress Notes (Signed)
D- Patient is quiet and isolative this shift.  Did not attend groups. Compliant with scheduled medications and no prn's requested.Refused patient inventory. Denies SI and contracts for safety.  A- Support and encouragement given. Continue POC and evaluation of treatment goals. Continue 15' chexks for safety.  R- Remains safe.

## 2012-09-19 NOTE — Progress Notes (Signed)
Psychoeducational Group Note  Date:  09/19/2012 Time:  2000  Group Topic/Focus:  Wrap-Up Group:   The focus of this group is to help patients review their daily goal of treatment and discuss progress on daily workbooks.  Participation Level:  Active  Participation Quality:  Appropriate  Affect:  Appropriate  Cognitive:  Appropriate  Insight:  Good  Engagement in Group:  Good  Additional Comments:  Patient attended and participated in group tonight. She reports that she went to her groups and talk to her doctor. Patient advised that she cope by talking to someone.  Lita Mains [facility] 09/19/2012, 12:28 AM

## 2012-09-20 LAB — GLUCOSE, CAPILLARY: Glucose-Capillary: 125 mg/dL — ABNORMAL HIGH (ref 70–99)

## 2012-09-20 NOTE — Progress Notes (Signed)
D: Patient in her room on approach. Patient cooperative but loud.  Patient states she was just cursed out by another patient for playing the piano.  Patient states she was not going to argue with the patient so she came to her room.  Patient states that people with dark skin the same complexion of the patient she argued with have bad attitudes.  Patient states she is aware that she is gaining weight.  Patient states she does not care that she is gaining weight.  Patient states she eats to comfort herself because she is dealing with a lot of things.  Patient states she is stressed because she may have to go live in a shelter.  Patient states she has not learned anything.  Patient denies SI/HI and denies AVH. A: Staff to monitor Q 15 mins for safety.  Encouragement and support offered.  Scheduled medications administered. per orders.  Trazodone administered prn for sleep. R: Patient remains safe on the unit.  Patient cooperative but remains loud.  Patient attended group tonight. Patient taking administered mediations

## 2012-09-20 NOTE — Progress Notes (Signed)
Psychoeducational Group Note  Date:  09/20/2012 Time:  2000  Group Topic/Focus:  Wrap-Up Group:   The focus of this group is to help patients review their daily goal of treatment and discuss progress on daily workbooks.  Participation Level:  Active  Participation Quality:  Appropriate  Affect:  Appropriate  Cognitive:  Appropriate  Insight:  Good  Engagement in Group:  Good  Additional Comments:  Patient attended and participated in group tonight. She reports having a good day. She talked to her sister, who promised to give her some money. She take care of her wellness by talking to people. Patient advised that she was tired, got up and left the group. She later returned  [name] 09/20/2012, 10:52 PM

## 2012-09-20 NOTE — Progress Notes (Signed)
Patient ID: [name], female   DOB: Nov 26, 1965, 46 y.o.   MRN: [id number] Patient continues to not progress in her treatment.  She is resistant to her medication; has to be prompted to take it.  She attempted to cheek her meds, especially the depakote up under her tongue.  She is isolative to her room the majority of the day coming out to eat snacks and go to the cafeteria.  She still maintains that she would rather go to a shelter than transfer to [facility].  She denies any SI/HI/AVH.  Continue to monitor medication management and MD order.  15 minute safety checks continued per protocol.  Patient's behavior has been appropriate; encourage and support patient.

## 2012-09-20 NOTE — Clinical Social Work Note (Signed)
Pt was seen this morning during discharge planning group. Pt stated that she was not ready for discharge.  Pt rated his depression level at a three and her anxiety level at four. Pt denies SI/HI/AVH.

## 2012-09-20 NOTE — Clinical Social Work Note (Signed)
  Type of Therapy: Process Group Therapy  Participation Level:  Minimal  Participation Quality:  Appropriate and Attentive  Affect:  Appropriate  Cognitive:  Alert and Oriented  Insight:  Limited  Engagement in Group:  Limited  Engagement in Therapy:  Limited  Modes of Intervention:  Activity, Clarification, Education, Problem-solving and Support  Summary of Progress/Problems: Today's group addressed the issue of overcoming obstacles.  Patients were asked to identify their biggest obstacle post d/c that stands in the way of their on-going success, and then problem solve as to how to manage this. Lilyian identified her obstacle as getting out of the hospital to a safe place, and talked about how there have not been any openings at the shelter.  She stated that she had a brief conversation with her sister yesterday, and plans to call her again today to see if maybe her sister will loan her some money so she can stay in a hotel.  Kewanna abruptly got up and opened the door at one point, saying that she was too hot.  She walked out of group, but later returned.       Ida Rogue 09/20/2012   4:23 PM

## 2012-09-20 NOTE — Progress Notes (Signed)
Patient ID: [name], female   DOB: March 31, 1966, 46 y.o.   MRN: [id number] [facility] MD Progress Note    Diagnosis: Diagnosis:  Axis I: Major Depression, Recurrent severe and with psychotic features  Axis II: Deferred  Axis III:  Past Medical History   Diagnosis   Obesity  Hypertension  DM2 new onset Date   Axis IV: housing problems, occupational problems and problems with primary support group  Axis V: 50  ADL's:  fair  Sleep: Fair  Appetite:  Fair   Suicidal Ideation:  None today Homicidal Ideation:  denies  AEB (as evidenced by):  Subjective: Patient seeing today and chart reviewed. She reports decreasing anxiety and depressive symptoms. She denies psychosis and delusional symptoms today. Patient remains socially withdrawn with minimal interaction with peers. Patient is looking forward to be placed in the shelter, [facility]  Mental Status Examination/Evaluation: Objective:  Appearance: casual  Eye Contact::  Fair  Speech:  Normal Rate  Volume:  Normal  Mood:   ok  Affect:  constricted  Thought Process:  Linear most of the time  Orientation:  Other:    Thought Content:  Fixed delusions   Suicidal Thoughts:  denies  Homicidal Thoughts:  No  Memory:  Immediate;   fair  Judgement:  marginal  Insight:  Lacking  Psychomotor Activity:  Normal  Concentration: fair  Recall:  fair  Akathisia:  No  Handed:  Right  AIMS (if indicated):     Assets:  Resilience  Sleep:  Number of Hours: 4.25   Vital Signs:Blood pressure 108/76, pulse 86, temperature 97.9 F (36.6 C), temperature source Oral, resp. rate 20, height 5' 4.75" (1.645 m), weight 107.049 kg (236 lb), last menstrual period 10/29/2011. Current Medications: Current Facility-Administered Medications  Medication Dose Route Frequency Provider Last Rate Last Dose  . acetaminophen (TYLENOL) tablet 650 mg  650 mg Oral Q6H PRN Shuvon Rankin, NP   650 mg at 09/08/12  1800  . alum & mag hydroxide-simeth (MAALOX/MYLANTA) 200-200-20 MG/5ML suspension 30 mL  30 mL Oral Q4H PRN Shuvon Rankin, NP      . benztropine (COGENTIN) tablet 1 mg  1 mg Oral BID Verne Spurr, PA-C   1 mg at 09/14/12 1017  . citalopram (CELEXA) tablet 20 mg  20 mg Oral Daily Kendelle Schweers   20 mg at 09/14/12 1017  . diphenhydrAMINE (BENADRYL) injection 25 mg  25 mg Intravenous Once Larena Sox, MD      . divalproex (DEPAKOTE ER) 24 hr tablet 500 mg  500 mg Oral BID Shoua Ressler   500 mg at 09/14/12 1019  . hydrOXYzine (ATARAX/VISTARIL) tablet 25 mg  25 mg Oral Q6H PRN Jorje Guild, PA-C      . lisinopril (PRINIVIL,ZESTRIL) tablet 5 mg  5 mg Oral Daily Verne Spurr, PA-C   5 mg at 09/14/12 1018  . LORazepam (ATIVAN) injection 2 mg  2 mg Intramuscular Once Larena Sox, MD      . LORazepam (ATIVAN) tablet 1 mg  1 mg Oral Q6H PRN Verne Spurr, PA-C   1 mg at 09/12/12 0136  . magnesium hydroxide (MILK OF MAGNESIA) suspension 30 mL  30 mL Oral Daily PRN Shuvon Rankin, NP      . OLANZapine zydis (ZYPREXA) disintegrating tablet 10 mg  10 mg Oral Q8H PRN Kenna Seward      . OLANZapine zydis (ZYPREXA) disintegrating tablet 5 mg  5 mg Oral QHS Verne Spurr, PA-C  5 mg at 09/13/12 2134  . risperiDONE (RISPERDAL M-TABS) disintegrating tablet 3 mg  3 mg Oral QHS Verne Spurr, PA-C   3 mg at 09/13/12 2134  . risperiDONE (RISPERDAL M-TABS) disintegrating tablet 3 mg  3 mg Oral BH-q7a Verne Spurr, PA-C   3 mg at 09/14/12 1018  . traZODone (DESYREL) tablet 100 mg  100 mg Oral QHS PRN Nanine Means, NP   100 mg at 09/13/12 2135    Lab Results:  Results for orders placed during the hospital encounter of 08/28/12 (from the past 48 hour(s))  GLUCOSE, CAPILLARY     Status: Abnormal   Collection Time   09/12/12  5:15 PM      Component Value Range Comment   Glucose-Capillary 132 (*) 70 - 99 mg/dL    Comment 1 Notify RN      Comment 2 Documented in Chart     GLUCOSE, CAPILLARY     Status:  Abnormal   Collection Time   09/13/12  6:12 AM      Component Value Range Comment   Glucose-Capillary 112 (*) 70 - 99 mg/dL   GLUCOSE, CAPILLARY     Status: Abnormal   Collection Time   09/13/12  6:38 PM      Component Value Range Comment   Glucose-Capillary 157 (*) 70 - 99 mg/dL    Comment 1 Notify RN     GLUCOSE, CAPILLARY     Status: Abnormal   Collection Time   09/14/12  7:35 AM      Component Value Range Comment   Glucose-Capillary 110 (*) 70 - 99 mg/dL     Physical Findings: AIMS: Facial and Oral Movements Muscles of Facial Expression: None, normal Lips and Perioral Area: None, normal Jaw: None, normal Tongue: None, normal,Extremity Movements Upper (arms, wrists, hands, fingers): None, normal Lower (legs, knees, ankles, toes): None, normal, Trunk Movements Neck, shoulders, hips: None, normal, Overall Severity Severity of abnormal movements (highest score from questions above): None, normal Incapacitation due to abnormal movements: None, normal Patient's awareness of abnormal movements (rate only patient's report): No Awareness, Dental Status Current problems with teeth and/or dentures?: No Does patient usually wear dentures?: No  CIWA:  CIWA-Ar Total: 0  COWS:  COWS Total Score: 0   Treatment Plan Summary: Daily contact with patient to assess and evaluate symptoms and progress in treatment Medication management  Plan:  1. Will continue current meds now  2. Will continue to await placement at [facility].  Thedore Mins, MD 10:52 AM

## 2012-09-21 DIAGNOSIS — F4323 Adjustment disorder with mixed anxiety and depressed mood: Secondary | ICD-10-CM

## 2012-09-21 LAB — GLUCOSE, CAPILLARY: Glucose-Capillary: 146 mg/dL — ABNORMAL HIGH (ref 70–99)

## 2012-09-21 NOTE — Progress Notes (Signed)
Patient ID: [name], female   DOB: April 13, 1966, 46 y.o.   MRN: [id number] Patient remains unchanged with her mood and affect.  She talked of her fears of going to the homeless shelter speaking of "those people hanging out front of the shelter..I'm just so scared."  She also expressed her concern over her increasing weight gain and asked about a breast reduction.  She stated that her family situation was "just a big mess" and that she has no support from them whatsoever.  She feels that she received little help from [name] here in that regard.  She denies any SI/HI/AVH.  She is observed for med cheeking in the morning.  Continue to monitor medication management and MD orders.  15 minute safety checks continued per protocol.  Encourage and support patient.  Patient's behavior has been appropriate.

## 2012-09-21 NOTE — Progress Notes (Signed)
Psychoeducational Group Note  Date:  09/21/2012 Time:  9:30am  Group Topic/Focus:  Recovery Goals:   The focus of this group is to identify appropriate goals for recovery and establish a plan to achieve them.  Participation Level:  Did Not Attend  Additional Comments: Patient did not attend   Nile Dear 09/21/2012, 10:33 AM

## 2012-09-21 NOTE — Progress Notes (Signed)
[facility] MD Progress Note  09/21/2012 10:49 AM [name]  MRN:  [id number]  Diagnosis:   AXIS I: adjustment d/ with mixed emotions, hx of Generalized Anxiety Disorder and Major Depression, Recurrent severe, hx of delusional d/o  AXIS II: Deferred  AXIS III:  Past Medical History   Diagnosis  Date   .  Anxiety    .  Major depressive disorder, recurrent, severe with psychotic features    AXIS IV: economic problems, housing problems, occupational problems, other psychosocial or environmental problems, problems related to social environment and problems with access to health care services  AXIS V: 21-30 behavior considerably influenced by delusions or hallucinations OR serious impairment in judgment, communication OR inability to function in almost all areas   ADL's:  Impaired  Sleep: Fair  Appetite:  Good  Suicidal Ideation:  denies Homicidal Ideation:  denies  AEB (as evidenced by): Subjective: Met with the patient to discuss her symptoms. She is guarded and vague about her family who she feels is not a good source for her. She states she is very frightened about going to the shelter and what could happen to her.  Yet, she refuses to reveal any information about her family or allow [name] to contact them.     [name] asks about a breast reduction due to the weight she has gained since her arrival. She feels that if she had a breast reduction she wouldn't have any problems. Mental Status Examination/Evaluation: Objective:  Appearance: Disheveled    Eye Contact::  Fair  Speech:  Normal Rate  Volume:  Normal  Mood:  Anxious and Depressed  Affect:  Constricted  And guarded, vague.  Thought Process:  Circumstantial  Orientation:  Other:    Thought Content:  Delusions  Suicidal Thoughts:  No  Homicidal Thoughts:  No  Memory:  Immediate;   Fair  Judgement:  Impaired  Insight:  Lacking  Psychomotor Activity:  Normal  Concentration:  Fair  Recall:  Fair  Akathisia:  No  Handed:   Right  AIMS (if indicated):     Assets:  Communication Skills  Sleep:  Number of Hours: 5    Vital Signs:Blood pressure 119/73, pulse 89, temperature 97.7 F (36.5 C), temperature source Oral, resp. rate 20, height 5' 4.75" (1.645 m), weight 68.493 kg (151 lb), last menstrual period 10/29/2011. Current Medications: Current Facility-Administered Medications  Medication Dose Route Frequency Provider Last Rate Last Dose  . acetaminophen (TYLENOL) tablet 650 mg  650 mg Oral Q6H PRN Shuvon Rankin, NP   650 mg at 09/08/12 1800  . alum & mag hydroxide-simeth (MAALOX/MYLANTA) 200-200-20 MG/5ML suspension 30 mL  30 mL Oral Q4H PRN Shuvon Rankin, NP      . benztropine (COGENTIN) tablet 1 mg  1 mg Oral BID Verne Spurr, PA-C   1 mg at 09/21/12 1023  . citalopram (CELEXA) tablet 20 mg  20 mg Oral Daily Mojeed Akintayo   20 mg at 09/21/12 1023  . diphenhydrAMINE (BENADRYL) injection 25 mg  25 mg Intravenous Once Larena Sox, MD      . divalproex (DEPAKOTE ER) 24 hr tablet 500 mg  500 mg Oral BID Mojeed Akintayo   500 mg at 09/21/12 1023  . hydrOXYzine (ATARAX/VISTARIL) tablet 25 mg  25 mg Oral Q6H PRN Jorje Guild, PA-C      . lisinopril (PRINIVIL,ZESTRIL) tablet 5 mg  5 mg Oral Daily Verne Spurr, PA-C   5 mg at 09/21/12 1023  . LORazepam (ATIVAN) injection 2 mg  2 mg Intramuscular Once Larena Sox, MD      . LORazepam (ATIVAN) tablet 1 mg  1 mg Oral Q6H PRN Verne Spurr, PA-C   1 mg at 09/19/12 2243  . magnesium hydroxide (MILK OF MAGNESIA) suspension 30 mL  30 mL Oral Daily PRN Shuvon Rankin, NP   30 mL at 09/16/12 2148  . OLANZapine zydis (ZYPREXA) disintegrating tablet 10 mg  10 mg Oral Q8H PRN Mojeed Akintayo      . OLANZapine zydis (ZYPREXA) disintegrating tablet 5 mg  5 mg Oral QHS Verne Spurr, PA-C   5 mg at 09/20/12 2139  . risperiDONE (RISPERDAL M-TABS) disintegrating tablet 3 mg  3 mg Oral QHS Verne Spurr, PA-C   3 mg at 09/20/12 2139  . risperiDONE (RISPERDAL M-TABS)  disintegrating tablet 3 mg  3 mg Oral BH-q7a Verne Spurr, PA-C   3 mg at 09/21/12 1023  . traZODone (DESYREL) tablet 100 mg  100 mg Oral QHS PRN Nanine Means, NP   100 mg at 09/20/12 2139    Lab Results:  Results for orders placed during the hospital encounter of 08/28/12 (from the past 48 hour(s))  GLUCOSE, CAPILLARY     Status: Abnormal   Collection Time   09/19/12  4:49 PM      Component Value Range Comment   Glucose-Capillary 115 (*) 70 - 99 mg/dL    Comment 1 Documented in Chart      Comment 2 Notify RN     GLUCOSE, CAPILLARY     Status: Abnormal   Collection Time   09/20/12  5:55 AM      Component Value Range Comment   Glucose-Capillary 100 (*) 70 - 99 mg/dL    Comment 1 Notify RN     GLUCOSE, CAPILLARY     Status: Abnormal   Collection Time   09/20/12  5:07 PM      Component Value Range Comment   Glucose-Capillary 125 (*) 70 - 99 mg/dL    Comment 1 Notify RN     GLUCOSE, CAPILLARY     Status: Abnormal   Collection Time   09/21/12  6:12 AM      Component Value Range Comment   Glucose-Capillary 117 (*) 70 - 99 mg/dL     Physical Findings: AIMS: Facial and Oral Movements Muscles of Facial Expression: None, normal Lips and Perioral Area: None, normal Jaw: None, normal Tongue: None, normal,Extremity Movements Upper (arms, wrists, hands, fingers): None, normal Lower (legs, knees, ankles, toes): None, normal, Trunk Movements Neck, shoulders, hips: None, normal, Overall Severity Severity of abnormal movements (highest score from questions above): None, normal Incapacitation due to abnormal movements: None, normal Patient's awareness of abnormal movements (rate only patient's report): No Awareness, Dental Status Current problems with teeth and/or dentures?: No Does patient usually wear dentures?: No  CIWA:  CIWA-Ar Total: 0  COWS:  COWS Total Score: 0   Treatment Plan Summary: Daily contact with patient to assess and evaluate symptoms and progress in  treatment Medication management  Plan: 1. Patient is encouraged to continue to take her medications, as there is some concern that she is cheeking the meds. 2. She is also encouraged to attend groups as she continues to sleep through a lot of them. 3. CM will continue to find a shelter. 4. No changes in medications at this time. Continue current plan of care.   Rona Ravens. Kashia Brossard PAC 09/21/2012, 10:49 AM

## 2012-09-21 NOTE — Progress Notes (Signed)
D: Patient in the dayroom on approach.  Patient states she had an ok day today.  Patient states her day was ok today.  Patient states she has not learned anything the entire time she has been here.  Patient states she is concerned because she thinks she needs a breast reduction and patient states she is gaining weight.  Patient states she no longer wants to walk down to the cafeteria because she thinks she eats more so she would like her meals to be brought to her room.  Patient denies SI/HI and denies AVH.  Patient verbally contracts for safety.   A: Staff to monitor Q 15 mins for safety.  Encouragement and support offered. Scheduled medications administered per orders.  Trazodone administered prn for sleep.  Patient was instructed that she needs to walk down to the cafeteria for each meal if she is able to.  R: Patient remains safe on the unit.  Patient verbalizes understanding of all instructions given per Clinical research associate.  Patient manipulative but cooperative and taking administered medications.  ** After group patient came to the writer and asked for an additional snack.  Patient stated she had already eaten 3 snacks but she wanted more.  Patient was instructed that she was not allowed to have additional snack and patient was begging and pleading for more snack.  Both RN and MHT told her no and patient stated she wanted to speak to the charge nurse because this was unfair.  Patient then changed her mind and stated she did not want to speak to the carge nurse but states she will worry about her diet another day.

## 2012-09-21 NOTE — Clinical Social Work Note (Signed)
Pt did not attend discharge planning group.   

## 2012-09-21 NOTE — Progress Notes (Signed)
Psychoeducational Group Note  Date:  09/21/2012 Time:  2000  Group Topic/Focus:  Wrap-Up Group:   The focus of this group is to help patients review their daily goal of treatment and discuss progress on daily workbooks.  Participation Level:  Active  Participation Quality:  Appropriate  Affect:  Appropriate  Cognitive:  Appropriate  Insight:  Limited  Engagement in Group:  Good  Additional Comments:  Patient shared that she has been successful with her goal of attending all groups. Patient had no complaints.   Lyndee Hensen 09/21/2012, 8:53 PM

## 2012-09-21 NOTE — Clinical Social Work Note (Signed)
  [facility] Group Notes:  (Counselor/Nursing/MHT/Case Management/Adjunct)  09/21/2012 1:34 PM   Type of Therapy:  Group Therapy  Participation Level:  Minimal  Participation Quality:  Appropriate  Affect:  Flat  Cognitive:  Alert  Insight:  Limited  Engagement in Group:  Limited  Engagement in Therapy:  Limited  Modes of Intervention:  Education, Problem-solving and Support  Summary of Progress/Problems: The theme of today's group was relapse prevention.  We focused specifically on both vulnerability and protective factors.  Patients were encourage to identify both, and identify at least one additional protective factor. Brittan stayed for the entire group.  She did not participate spontaneously, but when called on specifically, identified her potential d/c situation in the shelter as both a vulnerability and a protective factor.  The vulnerability is how it can feel unsafe and unstable; the protective factor is how it motivates her to set reachable goals for herself to set herself up for a more paremanent situation.  Daryel Gerald B 09/21/2012 1:34 PM

## 2012-09-22 LAB — GLUCOSE, CAPILLARY
Glucose-Capillary: 103 mg/dL — ABNORMAL HIGH (ref 70–99)
Glucose-Capillary: 96 mg/dL (ref 70–99)

## 2012-09-22 MED ORDER — DIVALPROEX SODIUM ER 500 MG PO TB24
1000.0000 mg | ORAL_TABLET | Freq: Every day | ORAL | Status: DC
  Administered 2012-09-23 – 2012-09-26 (×4): 1000 mg via ORAL
  Filled 2012-09-22: qty 6
  Filled 2012-09-22 (×5): qty 2

## 2012-09-22 NOTE — Progress Notes (Signed)
[facility] MD Progress Note  09/22/2012 1:38 PM [name]  MRN:  [id number]  Diagnosis:   AXIS I: adjustment d/ with mixed emotions, hx of Generalized Anxiety Disorder and Major Depression, Recurrent severe, hx of delusional d/o  AXIS II: Deferred  AXIS III:  Past Medical History   Diagnosis  Date   .  Anxiety    .  Major depressive disorder, recurrent, severe with psychotic features    AXIS IV: economic problems, housing problems, occupational problems, other psychosocial or environmental problems, problems related to social environment and problems with access to health care services  AXIS V: 21-30 behavior considerably influenced by delusions or hallucinations OR serious impairment in judgment, communication OR inability to function in almost all areas   ADL's:  Impaired  Sleep: poor  Appetite:  Good  Suicidal Ideation:  denies Homicidal Ideation:  denies  AEB (as evidenced by): Subjective: Met with the patient to discuss her symptoms. She is guarded and vague. She is isolating in her room. Due to her concern about weight gain, she had asked the PM nurse to bring her meals to her instead of going to the cafeteria, where she feels she eats more.  [name] was told she had to go to the cafeteria if she was able and that meals would not be brought to her. Later in the evening [name] had eaten 3 snacks and was upset when she was denied a fourth one. Previously in the evening she was asking about breast reduction surgery and felt that this would solve a lot of her problems.  Mental Status Examination/Evaluation: Objective:  Appearance: Disheveled    Eye Contact::  Fair  Speech:  Normal Rate  Volume:  Normal  Mood:  Anxious and Depressed  Affect:  Constricted  And guarded, vague.  Thought Process:  Circumstantial  Orientation:  Other:    Thought Content:  Delusions  Suicidal Thoughts:  No  Homicidal Thoughts:  No  Memory:  Immediate;   Fair  Judgement:  Impaired  Insight:   Lacking  Psychomotor Activity:  Normal  Concentration:  Fair  Recall:  Fair  Akathisia:  No  Handed:  Right  AIMS (if indicated):     Assets:  Communication Skills  Sleep:  Number of Hours: 4.25    Vital Signs:Blood pressure 132/89, pulse 102, temperature 97.7 F (36.5 C), temperature source Oral, resp. rate 20, height 5' 4.75" (1.645 m), weight 68.493 kg (151 lb), last menstrual period 10/29/2011. Current Medications: Current Facility-Administered Medications  Medication Dose Route Frequency Provider Last Rate Last Dose  . acetaminophen (TYLENOL) tablet 650 mg  650 mg Oral Q6H PRN Shuvon Rankin, NP   650 mg at 09/08/12 1800  . alum & mag hydroxide-simeth (MAALOX/MYLANTA) 200-200-20 MG/5ML suspension 30 mL  30 mL Oral Q4H PRN Shuvon Rankin, NP      . benztropine (COGENTIN) tablet 1 mg  1 mg Oral BID Verne Spurr, PA-C   1 mg at 09/21/12 1023  . citalopram (CELEXA) tablet 20 mg  20 mg Oral Daily Mojeed Akintayo   20 mg at 09/21/12 1023  . diphenhydrAMINE (BENADRYL) injection 25 mg  25 mg Intravenous Once Larena Sox, MD      . divalproex (DEPAKOTE ER) 24 hr tablet 500 mg  500 mg Oral BID Mojeed Akintayo   500 mg at 09/21/12 1023  . hydrOXYzine (ATARAX/VISTARIL) tablet 25 mg  25 mg Oral Q6H PRN Jorje Guild, PA-C      . lisinopril (PRINIVIL,ZESTRIL) tablet 5 mg  5 mg Oral Daily Verne Spurr, PA-C   5 mg at 09/21/12 1023  . LORazepam (ATIVAN) injection 2 mg  2 mg Intramuscular Once Larena Sox, MD      . LORazepam (ATIVAN) tablet 1 mg  1 mg Oral Q6H PRN Verne Spurr, PA-C   1 mg at 09/19/12 2243  . magnesium hydroxide (MILK OF MAGNESIA) suspension 30 mL  30 mL Oral Daily PRN Shuvon Rankin, NP   30 mL at 09/16/12 2148  . OLANZapine zydis (ZYPREXA) disintegrating tablet 10 mg  10 mg Oral Q8H PRN Mojeed Akintayo      . OLANZapine zydis (ZYPREXA) disintegrating tablet 5 mg  5 mg Oral QHS Verne Spurr, PA-C   5 mg at 09/20/12 2139  . risperiDONE (RISPERDAL M-TABS) disintegrating tablet  3 mg  3 mg Oral QHS Verne Spurr, PA-C   3 mg at 09/20/12 2139  . risperiDONE (RISPERDAL M-TABS) disintegrating tablet 3 mg  3 mg Oral BH-q7a Verne Spurr, PA-C   3 mg at 09/21/12 1023  . traZODone (DESYREL) tablet 100 mg  100 mg Oral QHS PRN Nanine Means, NP   100 mg at 09/20/12 2139    Lab Results:  Results for orders placed during the hospital encounter of 08/28/12 (from the past 48 hour(s))  GLUCOSE, CAPILLARY     Status: Abnormal   Collection Time   09/19/12  4:49 PM      Component Value Range Comment   Glucose-Capillary 115 (*) 70 - 99 mg/dL    Comment 1 Documented in Chart      Comment 2 Notify RN     GLUCOSE, CAPILLARY     Status: Abnormal   Collection Time   09/20/12  5:55 AM      Component Value Range Comment   Glucose-Capillary 100 (*) 70 - 99 mg/dL    Comment 1 Notify RN     GLUCOSE, CAPILLARY     Status: Abnormal   Collection Time   09/20/12  5:07 PM      Component Value Range Comment   Glucose-Capillary 125 (*) 70 - 99 mg/dL    Comment 1 Notify RN     GLUCOSE, CAPILLARY     Status: Abnormal   Collection Time   09/21/12  6:12 AM      Component Value Range Comment   Glucose-Capillary 117 (*) 70 - 99 mg/dL     Physical Findings: AIMS: Facial and Oral Movements Muscles of Facial Expression: None, normal Lips and Perioral Area: None, normal Jaw: None, normal Tongue: None, normal,Extremity Movements Upper (arms, wrists, hands, fingers): None, normal Lower (legs, knees, ankles, toes): None, normal, Trunk Movements Neck, shoulders, hips: None, normal, Overall Severity Severity of abnormal movements (highest score from questions above): None, normal Incapacitation due to abnormal movements: None, normal Patient's awareness of abnormal movements (rate only patient's report): No Awareness, Dental Status Current problems with teeth and/or dentures?: No Does patient usually wear dentures?: No  CIWA:  CIWA-Ar Total: 0  COWS:  COWS Total Score: 0   Treatment Plan  Summary: Daily contact with patient to assess and evaluate symptoms and progress in treatment Medication management  Plan: 1. Patient is encouraged to continue to take her medications, as there is some concern that she is cheeking the meds. 2. She is also encouraged to attend groups as she continues to sleep through a lot of them. 3. CM will continue to find a shelter. 4. Will move Depakote to HS dose to decrease daytime sleepiness.  5. Will continue other meds as written.  Rona Ravens. Mala Gibbard PAC 09/22/2012, 1:38 PM

## 2012-09-22 NOTE — Progress Notes (Signed)
Psychoeducational Group Note  Date:  09/22/2012 Time:  1100  Group Topic/Focus:  Self Care:   The focus of this group is to help patients understand the importance of self-care in order to improve or restore emotional, physical, spiritual, interpersonal, and financial health.  Participation Level:  Did Not Attend  Participation Quality:    Affect:    Cognitive:    Insight:    Engagement in Group:    Additional Comments:  Pt has been sleeping all morning long. Pt refused to attend.  Isla Pence M 09/22/2012, 11:47 AM

## 2012-09-22 NOTE — Progress Notes (Signed)
The focus of this group is to help patients review their daily goal of treatment and discuss progress on daily workbooks. Pt attended the evening wrap-up group and participated in the discussion about finding positives and expressing needs.

## 2012-09-22 NOTE — Progress Notes (Signed)
D: Patient in her room sitting in the dark on approach.  Patient states she was just relaxing.  Patient states she is ok.  Patient states she slept the whole day and even slept through lunch.  Patient states she has learned that it is not good to sleep all day.  Patient denies SI/HI and denies AVH.  Patient has been hyper verbal tonight.  Patient states she is so mad because she needs a car so she can go home.  Patient also states she also needs a home and she states she cannot believe she is in this predicament.  Patient then starts to smile and said she is ok.  A: Staff to monitor Q 15 mins for safety.  Encouragement and support offered.  Scheduled medications administered per orders. R: Patient remains safe on the unit.  Patient visible in the milieu.  Patient taking administered medications.

## 2012-09-22 NOTE — Treatment Plan (Signed)
Interdisciplinary Treatment Plan Update (Adult)  Date: 09/22/2012  Time Reviewed: 10:01 AM   Progress in Treatment: Attending groups: Yes Participating in groups: Yes Taking medication as prescribed: Yes Tolerating medication: Yes   Family/Significant other contact made:  No Patient understands diagnosis:  Yes Discussing patient identified problems/goals with staff:  Yes Medical problems stabilized or resolved:  Yes Denies suicidal/homicidal ideation: Yes  In tx team Issues/concerns per patient self-inventory:  Yes Other:  New problem(s) identified: N/A  Reason for Continuation of Hospitalization: Medication stabilization  Interventions implemented related to continuation of hospitalization:   Additional comments:  [name] and CM have been calling daily for [employer]  No openings    Estimated length of stay: Will d/c on Monday at latest as the winter shelter will open then  Discharge Plan:  Shelter, [facility]  New goal(s): N/A  Review of initial/current patient goals per problem list:   1.  Goal(s):Eliminate SI  Met:  Yes  Target date:see previous tx plan  As evidenced by:  2.  Goal (s):Stabilize mood  Met:  Yes  Target date:see previous tx plan  As evidenced by:  3.  Goal(s):Eliminate psychosis  Met:  Yes  Target date:see previous tx plan  As evidenced by:  4.  Goal(s):  Met:  Yes  Target date:  As evidenced by:  Attendees: Patient:  [name] 09/22/2012 10:01 AM  Family:     Physician:  Thedore Mins 09/22/2012 10:01 AM   Nursing:  Liborio Nixon  09/22/2012 10:01 AM   Clinical Social Worker:  Richelle Ito 09/22/2012 10:01 AM   Extender:  Verne Spurr PA 09/22/2012 10:01 AM   Other:     Other:     Other:     Other:      Scribe for Treatment Team:   Daryel Gerald B, 09/22/2012 10:01 AM

## 2012-09-22 NOTE — Progress Notes (Signed)
  D) Patient isolative but cooperative upon my assessment. Patient resistant to care, reluctant speak with staff at times. Patient refuses to complete Patient Self Inventory but states slept "good," and  Appetite is "okay." . Patient denies SI/HI, denies A/V hallucinations.   A) Patient offered support and encouragement, patient encouraged to discuss feelings/concerns with staff. Patient verbalized understanding. Patient monitored Q15 minutes for safety. Patient met with MD to discuss today's goals and plan of care.  R) Patient isolates to room at times, attending some groups in day room and most meals in dining room.  Patient taking medications as ordered. Will continue to monitor.

## 2012-09-22 NOTE — Clinical Social Work Note (Signed)
Pt did not attend discharge planning group.   

## 2012-09-23 DIAGNOSIS — F411 Generalized anxiety disorder: Secondary | ICD-10-CM

## 2012-09-23 NOTE — Progress Notes (Addendum)
D.  Pt pleasant on introduction, shook hands.  Denied complaints at this time, stated that she would like to have a diet pill ordered or get a referral for breast reduction surgery.  Did not wish to take her Cogentin at 1700 but stated that she may take it later tonight.  Pt states that she might go home tomorrow.  Denies SI/HI/hallucinations at this time.  A.  Support and encouragement offered R.  Will continue to monitor.  Pt is engaging in staff splitting to acquire more snacks.  She did attend evening karoake group

## 2012-09-23 NOTE — Progress Notes (Signed)
Psychoeducational Group Note  Date:  09/23/2012 Time:  1100  Group Topic/Focus:  Self Care:   The focus of this group is to help patients understand the importance of self-care in order to improve or restore emotional, physical, spiritual, interpersonal, and financial health.  Participation Level:  Active  Participation Quality:  Appropriate  Affect:  Appropriate  Cognitive:  Appropriate  Insight:  Good  Engagement in Group:  Good  Additional Comments:  none  Ahmod Gillespie, Genia Plants 09/23/2012, 5:31 PM

## 2012-09-23 NOTE — Progress Notes (Signed)
  D) Patient quiet but cooperative upon my assessment. Patient states slept "poor ," and  appetite is " good." Patient rates depression as   0/10, patient rates hopeless feelings as  5/10. Patient rates anxiety as 0/10. Patient denies SI/HI, denies A/V hallucinations.   A) Patient offered support and encouragement, patient encouraged to discuss feelings/concerns with staff. Patient verbalized understanding. Patient monitored Q15 minutes for safety. Patient met with MD to discuss today's goals and plan of care.  R) Patient isolates to room most of this shift, not  attending groups in day room. Patient encouraged to attend groups, patient answers "I don't need groups I am fine!."   Patient taking medications as ordered. Will continue to monitor.

## 2012-09-23 NOTE — Progress Notes (Signed)
[facility] MD Progress Note  09/23/2012 9:03 AM [name]  MRN:  [id number]  Diagnosis:   Axis I: Anxiety Disorder NOS and Major Depression, Recurrent severe Axis II: Deferred Axis III:  Past Medical History  Diagnosis Date  . Anxiety   . Major depressive disorder, recurrent, severe with psychotic features    Axis IV: economic problems, housing problems, occupational problems, other psychosocial or environmental problems, problems related to social environment and problems with primary support group Axis V: 21-30 behavior considerably influenced by delusions or hallucinations OR serious impairment in judgment, communication OR inability to function in almost all areas  ADL's:  Intact  Sleep: Fair  Appetite:  Fair  Suicidal Ideation:  Denies Homicidal Ideation:  Denies  Mental Status Examination/Evaluation: Objective:  Appearance: Casual  Eye Contact::  Minimal  Speech:  Normal Rate  Volume:  Normal  Mood:  Depressed  Affect:  Flat  Thought Process:  Logical  Orientation:  Full  Thought Content:  WDL  Suicidal Thoughts:  No  Homicidal Thoughts:  No  Memory:  Immediate;   Fair Recent;   Fair Remote;   Fair  Judgement:  Impaired  Insight:  Fair  Psychomotor Activity:  Decreased  Concentration:  Fair  Recall:  Fair  Akathisia:  No  Handed:  Right  AIMS (if indicated):     Assets:  Communication Skills Talents/Skills Vocational/Educational  Sleep:  Number of Hours: 1    Vital Signs:Blood pressure 118/90, pulse 110, temperature 98 F (36.7 C), temperature source Oral, resp. rate 16, height 5' 4.75" (1.645 m), weight 114.76 kg (253 lb), last menstrual period 10/29/2011. Current Medications: Current Facility-Administered Medications  Medication Dose Route Frequency Provider Last Rate Last Dose  . acetaminophen (TYLENOL) tablet 650 mg  650 mg Oral Q6H PRN Shuvon Rankin, NP   650 mg at 09/08/12 1800  . alum & mag hydroxide-simeth (MAALOX/MYLANTA) 200-200-20 MG/5ML  suspension 30 mL  30 mL Oral Q4H PRN Shuvon Rankin, NP      . benztropine (COGENTIN) tablet 1 mg  1 mg Oral BID Verne Spurr, PA-C   1 mg at 09/23/12 0744  . citalopram (CELEXA) tablet 20 mg  20 mg Oral Daily Mojeed Akintayo   20 mg at 09/23/12 0743  . diphenhydrAMINE (BENADRYL) injection 25 mg  25 mg Intravenous Once Larena Sox, MD      . divalproex (DEPAKOTE ER) 24 hr tablet 1,000 mg  1,000 mg Oral QHS Verne Spurr, PA-C      . hydrOXYzine (ATARAX/VISTARIL) tablet 25 mg  25 mg Oral Q6H PRN Jorje Guild, PA-C      . lisinopril (PRINIVIL,ZESTRIL) tablet 5 mg  5 mg Oral Daily Verne Spurr, PA-C   5 mg at 09/23/12 0744  . LORazepam (ATIVAN) injection 2 mg  2 mg Intramuscular Once Larena Sox, MD      . LORazepam (ATIVAN) tablet 1 mg  1 mg Oral Q6H PRN Verne Spurr, PA-C   1 mg at 09/19/12 2243  . magnesium hydroxide (MILK OF MAGNESIA) suspension 30 mL  30 mL Oral Daily PRN Shuvon Rankin, NP   30 mL at 09/16/12 2148  . OLANZapine zydis (ZYPREXA) disintegrating tablet 10 mg  10 mg Oral Q8H PRN Mojeed Akintayo      . OLANZapine zydis (ZYPREXA) disintegrating tablet 5 mg  5 mg Oral QHS Verne Spurr, PA-C   5 mg at 09/22/12 2155  . risperiDONE (RISPERDAL M-TABS) disintegrating tablet 3 mg  3 mg Oral QHS Verne Spurr, PA-C  3 mg at 09/22/12 2155  . risperiDONE (RISPERDAL M-TABS) disintegrating tablet 3 mg  3 mg Oral BH-q7a Verne Spurr, PA-C   3 mg at 09/23/12 0701  . traZODone (DESYREL) tablet 100 mg  100 mg Oral QHS PRN Nanine Means, NP   100 mg at 09/23/12 0005  . [DISCONTINUED] divalproex (DEPAKOTE ER) 24 hr tablet 500 mg  500 mg Oral BID Mojeed Akintayo   500 mg at 09/22/12 2725    Lab Results:  Results for orders placed during the hospital encounter of 08/28/12 (from the past 48 hour(s))  GLUCOSE, CAPILLARY     Status: Abnormal   Collection Time   09/21/12  4:59 PM      Component Value Range Comment   Glucose-Capillary 146 (*) 70 - 99 mg/dL   GLUCOSE, CAPILLARY     Status:  Abnormal   Collection Time   09/22/12  6:11 AM      Component Value Range Comment   Glucose-Capillary 103 (*) 70 - 99 mg/dL   GLUCOSE, CAPILLARY     Status: Normal   Collection Time   09/22/12  5:13 PM      Component Value Range Comment   Glucose-Capillary 96  70 - 99 mg/dL    Comment 1 Notify RN     GLUCOSE, CAPILLARY     Status: Abnormal   Collection Time   09/23/12  7:01 AM      Component Value Range Comment   Glucose-Capillary 112 (*) 70 - 99 mg/dL     Physical Findings: AIMS: Facial and Oral Movements Muscles of Facial Expression: None, normal Lips and Perioral Area: None, normal Jaw: None, normal Tongue: None, normal,Extremity Movements Upper (arms, wrists, hands, fingers): None, normal Lower (legs, knees, ankles, toes): None, normal, Trunk Movements Neck, shoulders, hips: None, normal, Overall Severity Severity of abnormal movements (highest score from questions above): None, normal Incapacitation due to abnormal movements: None, normal Patient's awareness of abnormal movements (rate only patient's report): No Awareness, Dental Status Current problems with teeth and/or dentures?: No Does patient usually wear dentures?: No  CIWA:  CIWA-Ar Total: 0  COWS:  COWS Total Score: 0   Treatment Plan Summary: Daily contact with patient to assess and evaluate symptoms and progress in treatment Medication management  Plan:  Patient reports sleep and appetite are "fine"--tired at times but not often, denies physical issues or discomforts, denies depression but is hopeless due to the inability to find housing, denies auditory/visual hallucinations and suicidal/homicidal thoughts.  Individual and group therapy continues.  Nanine Means, PMH-NP 09/23/2012, 9:03 AM

## 2012-09-24 LAB — GLUCOSE, CAPILLARY: Glucose-Capillary: 115 mg/dL — ABNORMAL HIGH (ref 70–99)

## 2012-09-24 NOTE — Clinical Social Work Note (Signed)
[name] attended AM group.  [name] wondered if I had called shelter, and I told her I had and let her know no openings today.  Informed her [facility], so [name] could leave that day if [name] chooses.  If not we will d/c Monday.  Confirmed that [name] will follow up at [facility].  [name] is concerned about checking blood sugar and wants to know how to do that.  Encouraged her to talk to NP about this.

## 2012-09-24 NOTE — Progress Notes (Signed)
Patient ID: [name], female   DOB: Jan 29, 1966, 46 y.o.   MRN: [id number] D: Patient in dark room speaking to herself on approach. Pt stated praying and does not need anything. Pt presented with depressed mood and flat affect. Pt denies SI/HI/AVH and pain.  Cooperative with assessment. No acute distressed noted at this time. Pt encouraged to come to staff with any question or concerns   A: Met with pt 1:1.  Safety has been maintained with Q15 minutes observation. Supported and encouragement provided.   R: Patient remains safe.  Safety has been maintained Q15 and continue current POC

## 2012-09-24 NOTE — Progress Notes (Signed)
[facility] Group Notes:  (Counselor/Nursing/MHT/Case Management/Adjunct)  09/24/2012   Type of Therapy:  Group Therapy  Participation Level:  Active  Participation Quality:  Appropriate and Sharing  Affect:  Appropriate  Cognitive:  Appropriate  Insight:  Good  Engagement in Group:  Good  Engagement in Therapy:  Limited  Modes of Intervention:  Clarification and Support  Summary of Progress/Problems: Focus of group session was sharing reason for hospitalization and what patients can do to prevent future hospitalizations. [name] shared that she has requested family to come in and meet with her this evening and discuss ways they can improve the family relationships.  She reports "we need to tear down walls of malice and I need to meet them where they are. I need for [name] to work on basic trust and values and I need a better place to go to when I leave."  [name] 09/24/2012,

## 2012-09-24 NOTE — Progress Notes (Signed)
[facility] MD Progress Note  09/24/2012 11:06 AM [name]  MRN:  [id number]  Diagnosis:   Axis I: Major Depression, Recurrent severe; delusional Axis II: Deferred Axis III:  Past Medical History  Diagnosis Date  . Anxiety   . Major depressive disorder, recurrent, severe with psychotic features    Axis IV: economic problems, housing problems, occupational problems, other psychosocial or environmental problems, problems related to social environment and problems with primary support group Axis V: 21-30 behavior considerably influenced by delusions or hallucinations OR serious impairment in judgment, communication OR inability to function in almost all areas  ADL's:  Intact  Sleep: Patients stated her sleep was good but only slept 3.5 hours  Appetite:  Fair  Suicidal Ideation:  Denies Homicidal Ideation:  Denies  Mental Status Examination/Evaluation: Objective:  Appearance: Casual  Eye Contact::  Fair  Speech:  Normal Rate  Volume:  Normal  Mood:  Depressed and Irritable  Affect:  Flat  Thought Process:  Logical  Orientation:  Full  Thought Content:  Delusions, thinks her medications are pseudo pills  Suicidal Thoughts:  No  Homicidal Thoughts:  No  Memory:  Immediate;   Fair Recent;   Fair Remote;   Fair  Judgement:  Impaired  Insight:  Lacking  Psychomotor Activity:  Decreased  Concentration:  Fair  Recall:  Fair  Akathisia:  No  Handed:  Right  AIMS (if indicated):     Assets:  Vocational/Educational  Sleep:  Number of Hours: 3.5    Vital Signs:Blood pressure 120/88, pulse 110, temperature 97.8 F (36.6 C), temperature source Oral, resp. rate 16, height 5' 4.75" (1.645 m), weight 114.76 kg (253 lb), last menstrual period 10/29/2011. Current Medications: Current Facility-Administered Medications  Medication Dose Route Frequency Provider Last Rate Last Dose  . acetaminophen (TYLENOL) tablet 650 mg  650 mg Oral Q6H PRN Shuvon Rankin, NP   650 mg at 09/08/12 1800    . alum & mag hydroxide-simeth (MAALOX/MYLANTA) 200-200-20 MG/5ML suspension 30 mL  30 mL Oral Q4H PRN Shuvon Rankin, NP      . benztropine (COGENTIN) tablet 1 mg  1 mg Oral BID Verne Spurr, PA-C   1 mg at 09/24/12 4098  . citalopram (CELEXA) tablet 20 mg  20 mg Oral Daily Mojeed Akintayo   20 mg at 09/24/12 1191  . diphenhydrAMINE (BENADRYL) injection 25 mg  25 mg Intravenous Once Larena Sox, MD      . divalproex (DEPAKOTE ER) 24 hr tablet 1,000 mg  1,000 mg Oral QHS Verne Spurr, PA-C   1,000 mg at 09/23/12 2158  . hydrOXYzine (ATARAX/VISTARIL) tablet 25 mg  25 mg Oral Q6H PRN Jorje Guild, PA-C      . lisinopril (PRINIVIL,ZESTRIL) tablet 5 mg  5 mg Oral Daily Verne Spurr, PA-C   5 mg at 09/24/12 4782  . LORazepam (ATIVAN) injection 2 mg  2 mg Intramuscular Once Larena Sox, MD      . LORazepam (ATIVAN) tablet 1 mg  1 mg Oral Q6H PRN Verne Spurr, PA-C   1 mg at 09/19/12 2243  . magnesium hydroxide (MILK OF MAGNESIA) suspension 30 mL  30 mL Oral Daily PRN Shuvon Rankin, NP   30 mL at 09/16/12 2148  . OLANZapine zydis (ZYPREXA) disintegrating tablet 10 mg  10 mg Oral Q8H PRN Mojeed Akintayo      . OLANZapine zydis (ZYPREXA) disintegrating tablet 5 mg  5 mg Oral QHS Verne Spurr, PA-C   5 mg at 09/23/12 2200  .  risperiDONE (RISPERDAL M-TABS) disintegrating tablet 3 mg  3 mg Oral QHS Verne Spurr, PA-C   3 mg at 09/23/12 2200  . risperiDONE (RISPERDAL M-TABS) disintegrating tablet 3 mg  3 mg Oral BH-q7a Verne Spurr, PA-C   3 mg at 09/24/12 0824  . traZODone (DESYREL) tablet 100 mg  100 mg Oral QHS PRN Nanine Means, NP   100 mg at 09/23/12 2208    Lab Results:  Results for orders placed during the hospital encounter of 08/28/12 (from the past 48 hour(s))  GLUCOSE, CAPILLARY     Status: Normal   Collection Time   09/22/12  5:13 PM      Component Value Range Comment   Glucose-Capillary 96  70 - 99 mg/dL    Comment 1 Notify RN     GLUCOSE, CAPILLARY     Status: Abnormal    Collection Time   09/23/12  7:01 AM      Component Value Range Comment   Glucose-Capillary 112 (*) 70 - 99 mg/dL   GLUCOSE, CAPILLARY     Status: Abnormal   Collection Time   09/23/12  4:33 PM      Component Value Range Comment   Glucose-Capillary 178 (*) 70 - 99 mg/dL   GLUCOSE, CAPILLARY     Status: Normal   Collection Time   09/24/12  6:05 AM      Component Value Range Comment   Glucose-Capillary 99  70 - 99 mg/dL     Physical Findings: AIMS: Facial and Oral Movements Muscles of Facial Expression: None, normal Lips and Perioral Area: None, normal Jaw: None, normal Tongue: None, normal,Extremity Movements Upper (arms, wrists, hands, fingers): None, normal Lower (legs, knees, ankles, toes): None, normal, Trunk Movements Neck, shoulders, hips: None, normal, Overall Severity Severity of abnormal movements (highest score from questions above): None, normal Incapacitation due to abnormal movements: None, normal Patient's awareness of abnormal movements (rate only patient's report): No Awareness, Dental Status Current problems with teeth and/or dentures?: No Does patient usually wear dentures?: No  CIWA:  CIWA-Ar Total: 0  COWS:  COWS Total Score: 0   Treatment Plan Summary: Daily contact with patient to assess and evaluate symptoms and progress in treatment Medication management  Plan:   Patient denied suicidal/homicidal ideations and auditory/visual hallucinations but still appears to be responding to internal stimuli, reports she slept well but only slept 3.5 hours, denies depression and does not want to continue her medications, she stated she felt the pills were pseudo pills, stated her family was coming to see her and at first appeared excited about it but then changed and said she did not want to see them, encouraged her to call them and let them know before they get here, [name] also reported she had planned on moving in with her mother but now feels she should go to a  shelter, close monitoring continues.  Nanine Means, PMH-NP 09/24/2012, 11:06 AM

## 2012-09-24 NOTE — Progress Notes (Signed)
Psychoeducational Group Note  Date:  09/24/2012 Time:  2000  Group Topic/Focus:  Karaoke   Participation Level:  None  Participation Quality:  Appropriate  Affect:  Appropriate  Cognitive:  Appropriate  Insight:  Limited  Engagement in Group:  None  Additional Comments:  Pt attended karaoke this evening but didn't participated in group. Pt was appropriate while in group.    Girlie Veltri A 09/24/2012, 12:19 AM

## 2012-09-24 NOTE — Progress Notes (Signed)
Psychoeducational Group Note  Date:  09/24/2012 Time:  2000  Group Topic/Focus:  Wrap-Up Group:   The focus of this group is to help patients review their daily goal of treatment and discuss progress on daily workbooks.  Participation Level:  Active  Participation Quality:  Appropriate  Affect:  Appropriate and Depressed  Cognitive:  Appropriate  Insight:  Good  Engagement in Group:  Good  Additional Comments:  Pt attended wrap-up group this evening. Pt shared that she spoke to her family this evening and requested for her family to visit. Pt shared that she wants to improve family relationship. Pt is depressed due to her family not visiting her this evening.    Raahim Shartzer A 09/24/2012, 9:38 PM

## 2012-09-24 NOTE — Progress Notes (Signed)
D: Patient denies SI/HI and auditory and visual hallucinations. The patient has an irritable mood and affect. The patient refuses to fill out her self-inventory sheet. The patient prefers to isolate to her room throughout the day. The patient also needed to be persuaded into taking her morning medication (she believes it "makes her sleepy" and that she "doesn't need it").  A: Patient given emotional support from RN. Patient encouraged to come to staff with concerns and/or questions. Patient's medication routine continued. Patient's orders and plan of care reviewed. Patient given education regarding the importance of medication compliance and patient successfully took her medication.  R: Patient remains cooperative. Will continue to monitor patient q15 minutes for safety.

## 2012-09-24 NOTE — Progress Notes (Signed)
Psychoeducational Group Note  Date:  09/24/2012 Time:  1100  Group Topic/Focus:  Relapse Prevention Planning:   The focus of this group is to define relapse and discuss the need for planning to combat relapse.  Participation Level:  Minimal  Participation Quality:  Appropriate  Affect:  Appropriate  Cognitive:  Appropriate  Insight:  Good  Engagement in Group:  Good  Additional Comments:  Pt participated in group.  Marquis Lunch, Cheney Gosch 09/24/2012, 1:07 PM

## 2012-09-25 DIAGNOSIS — F29 Unspecified psychosis not due to a substance or known physiological condition: Secondary | ICD-10-CM

## 2012-09-25 LAB — GLUCOSE, CAPILLARY: Glucose-Capillary: 100 mg/dL — ABNORMAL HIGH (ref 70–99)

## 2012-09-25 NOTE — Progress Notes (Signed)
Psychoeducational Group Note  Date:  09/25/2012 Time:  0900  Group Topic/Focus:  self inventory  Participation Level:  None  Participation Quality:  Drowsy and Inattentive  Affect:  Blunted and Depressed  Cognitive:   Asleep  Insight:  None  Engagement in Group:  None  Additional Comments:    [name] 09/25/2012, 9:46 AM

## 2012-09-25 NOTE — Clinical Social Work Note (Signed)
[facility] Group Notes: (Clinical Social Work)   09/25/2012 11:15-11:45   Type of Therapy:  Group Therapy   Participation Level:  Did Not Attend    Ambrose Mantle, LCSW 09/25/2012, 12:16 PM

## 2012-09-25 NOTE — Progress Notes (Signed)
Patient ID: [name], female   DOB: 1965-12-25, 46 y.o.   MRN: [id number] D: Patient in dayroom on approach. Pt presented with depressed mood and flat affect. Pt stated "mom and sister were suppose to visit today nut did not show up and she feels sad". Pt interacting appropriately with peers.  No acute distressed noted. Denies SI/HI/AV and pain. Calm and cooperative with assessment. Pt encouraged to come to staff with any question or concerns  A: Met with pt 1:1. Medications administered as prescribed. Safety has been maintained with Q15 minutes observation. Supported and encouragement provided to attend groups.  R: Patient remains safe. She is complaint with medications. Safety has been maintained Q15 and continue current POC.

## 2012-09-25 NOTE — Progress Notes (Signed)
Psychoeducational Group Note  Date:  09/25/2012 Time:  1515  Group Topic/Focus:  Coping With Mental Health Crisis:   The purpose of this group is to help patients identify strategies for coping with mental health crisis.  Group discusses possible causes of crisis and ways to manage them effectively.  Participation Level:  Minimal  Participation Quality:  Appropriate and Attentive  Affect:  Appropriate  Cognitive:  Alert and Appropriate  Insight:  Good  Engagement in Group:  Good  Additional Comments:  Pt participated and processed in group.  Marquis Lunch, Dennard Vezina 09/25/2012, 4:42 PM

## 2012-09-25 NOTE — Progress Notes (Signed)
[facility] MD Progress Note  09/25/2012 8:40 AM [name]  MRN:  [id number]  Diagnosis:   Axis I: Major Depression, Recurrent severe and Psychotic Disorder NOS Axis II: Deferred Axis III:  Past Medical History  Diagnosis Date  . Anxiety   . Major depressive disorder, recurrent, severe with psychotic features    Axis IV: economic problems, housing problems, occupational problems, other psychosocial or environmental problems, problems related to social environment and problems with primary support group Axis V: 31-40 impairment in reality testing  ADL's:  Intact  Sleep: Poor, patient stated it was good  Appetite:  Good  Suicidal Ideation:  Denies Homicidal Ideation:  Denies  Mental Status Examination/Evaluation: Objective:  Appearance: Casual  Eye Contact::  Good  Speech:  Normal Rate  Volume:  Normal  Mood:  Irritable  Affect:  Constricted  Thought Process:  Tangential  Orientation:  Full  Thought Content:  Obsessions  Suicidal Thoughts:  No  Homicidal Thoughts:  No  Memory:  Immediate;   Fair Recent;   Fair Remote;   Fair  Judgement:  Fair  Insight:  Fair  Psychomotor Activity:  Decreased  Concentration:  Fair  Recall:  Fair  Akathisia:  No  Handed:  Right  AIMS (if indicated):     Assets:  Vocational/Educational  Sleep:  Number of Hours: 3    Vital Signs:Blood pressure 134/83, pulse 93, temperature 97.6 F (36.4 C), temperature source Oral, resp. rate 20, height 5' 4.75" (1.645 m), weight 114.76 kg (253 lb), last menstrual period 10/29/2011. Current Medications: Current Facility-Administered Medications  Medication Dose Route Frequency Provider Last Rate Last Dose  . acetaminophen (TYLENOL) tablet 650 mg  650 mg Oral Q6H PRN Shuvon Rankin, NP   650 mg at 09/08/12 1800  . alum & mag hydroxide-simeth (MAALOX/MYLANTA) 200-200-20 MG/5ML suspension 30 mL  30 mL Oral Q4H PRN Shuvon Rankin, NP      . benztropine (COGENTIN) tablet 1 mg  1 mg Oral BID Verne Spurr,  PA-C   1 mg at 09/25/12 4098  . citalopram (CELEXA) tablet 20 mg  20 mg Oral Daily Mojeed Akintayo   20 mg at 09/25/12 0828  . diphenhydrAMINE (BENADRYL) injection 25 mg  25 mg Intravenous Once Larena Sox, MD      . divalproex (DEPAKOTE ER) 24 hr tablet 1,000 mg  1,000 mg Oral QHS Verne Spurr, PA-C   1,000 mg at 09/24/12 2203  . hydrOXYzine (ATARAX/VISTARIL) tablet 25 mg  25 mg Oral Q6H PRN Jorje Guild, PA-C      . lisinopril (PRINIVIL,ZESTRIL) tablet 5 mg  5 mg Oral Daily Verne Spurr, PA-C   5 mg at 09/25/12 1191  . LORazepam (ATIVAN) injection 2 mg  2 mg Intramuscular Once Larena Sox, MD      . LORazepam (ATIVAN) tablet 1 mg  1 mg Oral Q6H PRN Verne Spurr, PA-C   1 mg at 09/19/12 2243  . magnesium hydroxide (MILK OF MAGNESIA) suspension 30 mL  30 mL Oral Daily PRN Shuvon Rankin, NP   30 mL at 09/16/12 2148  . OLANZapine zydis (ZYPREXA) disintegrating tablet 10 mg  10 mg Oral Q8H PRN Mojeed Akintayo      . OLANZapine zydis (ZYPREXA) disintegrating tablet 5 mg  5 mg Oral QHS Verne Spurr, PA-C   5 mg at 09/24/12 2206  . risperiDONE (RISPERDAL M-TABS) disintegrating tablet 3 mg  3 mg Oral QHS Verne Spurr, PA-C   3 mg at 09/24/12 2207  . risperiDONE (RISPERDAL M-TABS)  disintegrating tablet 3 mg  3 mg Oral BH-q7a Verne Spurr, PA-C   3 mg at 09/25/12 1610  . traZODone (DESYREL) tablet 100 mg  100 mg Oral QHS PRN Nanine Means, NP   100 mg at 09/24/12 2204    Lab Results:  Results for orders placed during the hospital encounter of 08/28/12 (from the past 48 hour(s))  GLUCOSE, CAPILLARY     Status: Abnormal   Collection Time   09/23/12  4:33 PM      Component Value Range Comment   Glucose-Capillary 178 (*) 70 - 99 mg/dL   GLUCOSE, CAPILLARY     Status: Normal   Collection Time   09/24/12  6:05 AM      Component Value Range Comment   Glucose-Capillary 99  70 - 99 mg/dL   GLUCOSE, CAPILLARY     Status: Abnormal   Collection Time   09/24/12  4:51 PM      Component Value  Range Comment   Glucose-Capillary 115 (*) 70 - 99 mg/dL    Comment 1 Documented in Chart      Comment 2 Notify RN     GLUCOSE, CAPILLARY     Status: Abnormal   Collection Time   09/25/12  8:18 AM      Component Value Range Comment   Glucose-Capillary 100 (*) 70 - 99 mg/dL    Comment 1 Documented in Chart      Comment 2 Notify RN       Physical Findings: AIMS: Facial and Oral Movements Muscles of Facial Expression: None, normal Lips and Perioral Area: None, normal Jaw: None, normal Tongue: None, normal,Extremity Movements Upper (arms, wrists, hands, fingers): None, normal Lower (legs, knees, ankles, toes): None, normal, Trunk Movements Neck, shoulders, hips: None, normal, Overall Severity Severity of abnormal movements (highest score from questions above): None, normal Incapacitation due to abnormal movements: None, normal Patient's awareness of abnormal movements (rate only patient's report): No Awareness, Dental Status Current problems with teeth and/or dentures?: No Does patient usually wear dentures?: No  CIWA:  CIWA-Ar Total: 0  COWS:  COWS Total Score: 0   Treatment Plan Summary: Daily contact with patient to assess and evaluate symptoms and progress in treatment Medication management  Plan:  Patient stated she slept well but only noted as having 3 hours of sleep, labile and irritable at times, appetite good, denies auditory/visual hallucinations and suicidal/homicidal ideations, she did talk to her family last night and smiled when she talked about it but does not want to live with her mother after discharge, does not want to go to [employer] despite saying she did yesterday, complained of "itchy skin" at times--on assessment, skin dry--patient stated she has lotion--encouraged her to use it after her shower daily to help her dry skin.  Discharge planned for tomorrow per schedule.  Nanine Means, PMH-NP 09/25/2012, 8:40 AM

## 2012-09-25 NOTE — Progress Notes (Signed)
Psychoeducational Group Note  Date:  09/25/2012 Time:  2000  Group Topic/Focus:  Wrap-Up Group:   The focus of this group is to help patients review their daily goal of treatment and discuss progress on daily workbooks.  Participation Level:  Active  Participation Quality:  Appropriate  Affect:  Appropriate  Cognitive:  Appropriate  Insight:  Good  Engagement in Group:  Good  Additional Comments:  Patient attended and participated in group tonight. She reports having a good day. She missed breakfast and lunch, however, went to dinner. She advised that she attended one of her group today. Her family She talked with family today and she slept a lot. She believe her sleeping may due to her medication. The patient reports that she cope by reading and exercise.  Lita Mains [facility] 09/25/2012, 10:17 PM

## 2012-09-25 NOTE — Progress Notes (Signed)
Patient ID: [name], female   DOB: 01/17/1966, 46 y.o.   MRN: [id number] D: Pt is awake and active on the unit this AM. Pt denies SI/HI and A/V hallucinations. Pt is not participating in the milieu but is cooperative with staff. Pt fell asleep during group and excused herself and returned to her room. Pt rates their depression at 10 and hopelessness at 10. Pt mood is depressed and her affect is blunted. Pt states that she does not wish to return home with her mother.  A: Writer utilized therapeutic communication, encouraged pt to discuss feelings with staff and administered medication per MD orders. Writer also encouraged pt to attend groups. Writer suggested that her mother would visit another time and that she might keep an open mind about living with her after discharge.  R: Pt agreed to stay open minded about her discharge plan. Pt is attending some groups but is asleep but she tolerating medications well. Writer will continue to monitor. 15 minute checks are ongoing for safety.

## 2012-09-25 NOTE — Progress Notes (Signed)
Psychoeducational Group Note  Date:  09/25/2012 Time:  0945 am  Group Topic/Focus:  Identifying Needs:   The focus of this group is to help patients identify their personal needs that have been historically problematic and identify healthy behaviors to address their needs.  Participation Level:  Did Not Attend  Andrena Mews 09/25/2012,10:32 AM

## 2012-09-25 NOTE — Progress Notes (Signed)
Pt remains superficial in her interactions even though writer is well known to patient. She reports her mom and sister are offering support and sister is to bring her clothes. States she can and would be interested in discharging to mom's home but then says, "I may just go to a winter shelter." She admits this arrangement has not worked previously and she has ended up staying with grandmother. Pt's affect remains sad and depressed and is congruent with mood. Pt initially refused hs meds and requested her trazadone prn stating, "everything else keeps me up and I can't sleep." Pt later changed her mind and took meds. Support and encouragement given. On reassess of trazadone pt states she is wide awake though admits she napped a lot today. Denies SI/HI/AVH and is safe. Lawrence Marseilles

## 2012-09-26 LAB — GLUCOSE, CAPILLARY

## 2012-09-26 NOTE — Progress Notes (Signed)
[name] was in bed most of morning. Did not attend am groups.  Reports that she is fatigued upon awakening. Went down to cafeteria with peers for afternoon meal.  Initially refused am meds, then took them following encouragement.  Denies SI and states she is "ready for discharge".  A- Support and encouragement given. Continue current POC and evaluation of treatment goals.  Continue 15' checks for safety.  R- Remains safe. Appropriate behavior.

## 2012-09-26 NOTE — Clinical Social Work Note (Signed)
[facility] Group Notes: (Clinical Social Work)   09/26/2012  11:15-11:45am   Type of Therapy:  Group Therapy   Participation Level:  Did Not Attend    Ambrose Mantle, LCSW 09/26/2012, 12:18 PM

## 2012-09-26 NOTE — Progress Notes (Signed)
Pt offered 0700 Risperdal dose. Pt states, "I'm not going to take any more meds except for my sleeping pill." "I'm going to go solo and just attend groups." Reminded pt her depression and hopelessness were both a 10/10 yesterday but pt states, "no, I'm not going to take any more." Strongly encouraged pt to reconsider but pt unreceptive at this time. Lawrence Marseilles

## 2012-09-26 NOTE — Progress Notes (Signed)
D: In dayroom watching TV on approach. Agreed to speak with Clinical research associate. Appears flat. Calm and cooperative with assessment. No acute distress noted. States she feels like she continues to do well. Discussed DCP and she expressed embarrassment r/t having to potentially go to a shelter. She went on to detail the events leading up to her admission and discussed her plans to hopefully return to school to work on her PhD. When Clinical research associate reminded pt that medication compliance will be a crucial element in her organization, she dismissed the medication as only being necessary here so she can get some sleep and she most likely will not take them after DC. She denies SI/HI/AVH and contracts for safety. She requested a prn for sleep but offered no additional questions or concerns.   A: Safety has been maintained with Q15 minute observation. Support and encouragement provided. Meds given as ordered by MD. POC explained and understanding verbalized. Encouraged med compliance as a crucial part of her DCP. PRN provided for sleep.    R: Pt is calm and cooperative. She is attending groups and participating appropriately. She needs additional reinforcement r/t medications. Will f/u response to prn, continue Q15 minute observation and continue current POC.

## 2012-09-26 NOTE — Progress Notes (Signed)
Psychoeducational Group Note  Date:  09/26/2012 Time:  1000  Group Topic/Focus:  Spirituality:   The focus of this group is to discuss how one's spirituality can aide in recovery.  Participation Level:  Did Not Attend  Participation Quality:    Affect:    Cognitive:    Insight:    Engagement in Group:    Additional Comments:    Cresenciano Lick 09/26/2012, 10:45 AM

## 2012-09-26 NOTE — Progress Notes (Signed)
[facility] MD Progress Note  09/26/2012 10:04 AM [name]  MRN:  [id number]  Diagnosis:   Axis I: Major Depression, Recurrent severe with psychotic features Axis II: Deferred Axis III:  Past Medical History  Diagnosis Date  . Anxiety   . Major depressive disorder, recurrent, severe with psychotic features    Axis IV: economic problems, housing problems, occupational problems, other psychosocial or environmental problems, problems related to social environment and problems with primary support group Axis V: 31-40 impairment in reality testing  ADL's:  Intact  Sleep: Fair  Appetite:  Good  Suicidal Ideation:  Denies Homicidal Ideation:  Denies  Mental Status Examination/Evaluation: Objective:  Appearance: Casual  Eye Contact::  Minimal  Speech:  Slow  Volume:  Decreased  Mood:  Depressed  Affect:  Flat  Thought Process:  Logical  Orientation:  Full  Thought Content:  Obsessions  Suicidal Thoughts:  No  Homicidal Thoughts:  No  Memory:  Immediate;   Fair Recent;   Fair Remote;   Fair  Judgement:  Poor  Insight:  Lacking  Psychomotor Activity:  Decreased  Concentration:  Fair  Recall:  Fair  Akathisia:  No  Handed:  Right  AIMS (if indicated):     Assets:  Vocational/Educational  Sleep:  Number of Hours: 5    Vital Signs:Blood pressure 139/96, pulse 111, temperature 97.3 F (36.3 C), temperature source Oral, resp. rate 20, height 5' 4.75" (1.645 m), weight 113.286 kg (249 lb 12 oz), last menstrual period 10/29/2011. Current Medications: Current Facility-Administered Medications  Medication Dose Route Frequency Provider Last Rate Last Dose  . acetaminophen (TYLENOL) tablet 650 mg  650 mg Oral Q6H PRN Shuvon Rankin, NP   650 mg at 09/08/12 1800  . alum & mag hydroxide-simeth (MAALOX/MYLANTA) 200-200-20 MG/5ML suspension 30 mL  30 mL Oral Q4H PRN Shuvon Rankin, NP      . benztropine (COGENTIN) tablet 1 mg  1 mg Oral BID Verne Spurr, PA-C   1 mg at 09/25/12 7829    . citalopram (CELEXA) tablet 20 mg  20 mg Oral Daily Mojeed Akintayo   20 mg at 09/25/12 0828  . diphenhydrAMINE (BENADRYL) injection 25 mg  25 mg Intravenous Once Larena Sox, MD      . divalproex (DEPAKOTE ER) 24 hr tablet 1,000 mg  1,000 mg Oral QHS Verne Spurr, PA-C   1,000 mg at 09/25/12 2200  . hydrOXYzine (ATARAX/VISTARIL) tablet 25 mg  25 mg Oral Q6H PRN Jorje Guild, PA-C      . lisinopril (PRINIVIL,ZESTRIL) tablet 5 mg  5 mg Oral Daily Verne Spurr, PA-C   5 mg at 09/25/12 5621  . LORazepam (ATIVAN) injection 2 mg  2 mg Intramuscular Once Larena Sox, MD      . LORazepam (ATIVAN) tablet 1 mg  1 mg Oral Q6H PRN Verne Spurr, PA-C   1 mg at 09/19/12 2243  . magnesium hydroxide (MILK OF MAGNESIA) suspension 30 mL  30 mL Oral Daily PRN Shuvon Rankin, NP   30 mL at 09/16/12 2148  . OLANZapine zydis (ZYPREXA) disintegrating tablet 10 mg  10 mg Oral Q8H PRN Mojeed Akintayo      . OLANZapine zydis (ZYPREXA) disintegrating tablet 5 mg  5 mg Oral QHS Verne Spurr, PA-C   5 mg at 09/25/12 2200  . risperiDONE (RISPERDAL M-TABS) disintegrating tablet 3 mg  3 mg Oral QHS Verne Spurr, PA-C   3 mg at 09/25/12 2200  . risperiDONE (RISPERDAL M-TABS) disintegrating tablet 3 mg  3 mg Oral [address] PA-C   3 mg at 09/25/12 1610  . traZODone (DESYREL) tablet 100 mg  100 mg Oral QHS PRN Nanine Means, NP   100 mg at 09/25/12 2121    Lab Results:  Results for orders placed during the hospital encounter of 08/28/12 (from the past 48 hour(s))  GLUCOSE, CAPILLARY     Status: Abnormal   Collection Time   09/24/12  4:51 PM      Component Value Range Comment   Glucose-Capillary 115 (*) 70 - 99 mg/dL    Comment 1 Documented in Chart      Comment 2 Notify RN     GLUCOSE, CAPILLARY     Status: Abnormal   Collection Time   09/25/12  8:18 AM      Component Value Range Comment   Glucose-Capillary 100 (*) 70 - 99 mg/dL    Comment 1 Documented in Chart      Comment 2 Notify RN      GLUCOSE, CAPILLARY     Status: Normal   Collection Time   09/25/12  5:24 PM      Component Value Range Comment   Glucose-Capillary 91  70 - 99 mg/dL    Comment 1 Documented in Chart      Comment 2 Notify RN     GLUCOSE, CAPILLARY     Status: Abnormal   Collection Time   09/26/12  6:12 AM      Component Value Range Comment   Glucose-Capillary 106 (*) 70 - 99 mg/dL     Physical Findings: AIMS: Facial and Oral Movements Muscles of Facial Expression: None, normal Lips and Perioral Area: None, normal Jaw: None, normal Tongue: None, normal,Extremity Movements Upper (arms, wrists, hands, fingers): None, normal Lower (legs, knees, ankles, toes): None, normal, Trunk Movements Neck, shoulders, hips: None, normal, Overall Severity Severity of abnormal movements (highest score from questions above): None, normal Incapacitation due to abnormal movements: None, normal Patient's awareness of abnormal movements (rate only patient's report): No Awareness, Dental Status Current problems with teeth and/or dentures?: No Does patient usually wear dentures?: No  CIWA:  CIWA-Ar Total: 0  COWS:  COWS Total Score: 0   Treatment Plan Summary: Daily contact with patient to assess and evaluate symptoms and progress in treatment Medication management  Plan:  Patient was asleep in her chair--she reported sleeping well but still felt tired, flat affect, minimal answers to questions, denies pain, reports good appetite and depression a 3/10 (10 is the worst), patient stated her only concern was her weight, denies any physical pain or discomfort, denies suicidal/homicidal ideations and auditory/visual hallucinations, monitoring and group/individual therapy to continue.  Nanine Means, PMH-NP 09/26/2012, 10:04 AM

## 2012-09-26 NOTE — Progress Notes (Signed)
Psychoeducational Group Note  Date:  09/26/2012 Time:  1315  Group Topic/Focus:  Therapeutic activity  Participation Level:  Active  Participation Quality:  Appropriate, Attentive and Sharing  Affect:  Appropriate and Depressed  Cognitive:  Alert, Appropriate and Oriented  Insight:  Good  Engagement in Group:  Good  Additional Comments:  Pt states that she does wish to go home with her mother after discharge and wants to "stay connected with her family in the future."  Barbette Merino, Ellyn Rubiano Shari Prows 09/26/2012, 1:57 PM

## 2012-09-27 MED ORDER — RISPERIDONE 3 MG PO TBDP: 3.0000 mg | ORAL_TABLET | Freq: Two times a day (BID) | ORAL | Status: DC

## 2012-09-27 MED ORDER — LISINOPRIL 5 MG PO TABS: 5.0000 mg | ORAL_TABLET | Freq: Every day | ORAL | Status: DC

## 2012-09-27 MED ORDER — RISPERIDONE 1 MG PO TBDP
3.0000 mg | ORAL_TABLET | ORAL | Status: DC
  Filled 2012-09-27 (×2): qty 18

## 2012-09-27 MED ORDER — CITALOPRAM HYDROBROMIDE 20 MG PO TABS: 20.0000 mg | ORAL_TABLET | Freq: Every day | ORAL | Status: DC

## 2012-09-27 MED ORDER — DIVALPROEX SODIUM ER 500 MG PO TB24: 1000.0000 mg | ORAL_TABLET | Freq: Every day | ORAL | Status: DC

## 2012-09-27 MED ORDER — RISPERIDONE 3 MG PO TBDP: 3.0000 mg | ORAL_TABLET | Freq: Every day | ORAL | Status: DC

## 2012-09-27 MED ORDER — BENZTROPINE MESYLATE 1 MG PO TABS: 1.0000 mg | ORAL_TABLET | Freq: Two times a day (BID) | ORAL | Status: DC

## 2012-09-27 MED ORDER — TRAZODONE HCL 100 MG PO TABS: 100.0000 mg | ORAL_TABLET | Freq: Every evening | ORAL | Status: DC | PRN

## 2012-09-27 NOTE — Progress Notes (Signed)
Patient ID: [name], female   DOB: [birth year], 46 y.o.   MRN: [id number] Patient discharged to self care / shelter per MD order.  Patient had meeting with treatment team members and Chiropodist.  She expressed her concern over being discharged today without knowing how to take her blood sugar.  Nurse explained to patient that her levels did not warrant her to be on medication daily or take insulin; that it was a precautionary measure while she was here in the hospital.  Explained we were also monitoring her blood pressure and had her on a low dose of lisinopril which we were giving her samples of.  Patient was not willing to stay with any family members until a bed became available at the shelter.  She felt we were "throwing her to the wolves" so to speak.  She spoke with the MD this am and denied any SI/HI/AVH.  Nursing called urgent care and set up an appointment for her to be seen Wednesday afternoon for her continuing medical needs.  Patient also spoke to a female Therapist, nutritional) at [facility] who stated that a bed should be available tonight for her if she showed up this evening.  She was given a bus pass and all appropriate paperwork.  Patient understood all instructions and was calm and cooperative during the discharge process.  She left ambulatory for the bus stop.

## 2012-09-27 NOTE — Progress Notes (Signed)
D. Met with patient with Richelle Ito, CSW and Joslyn Devon, RN to discuss patient's plan for discharge today as patient expressed concerns she had no place to go as could not get into the [facility] overflow shelter until 12/6 and there are currently no available beds at the [facility].  Patient reported she had contacted family with [facility] and there were no options.  Patient reported concerns she now was testing positive for "high" glucose levels and needed to be taught over the next 3 days how to check her blood glucose levels 2-3 times per day as was currently being done.  Patient stated concern "I am now taking medication for seizures and haven't been taught about that".   Patient reported "I think you all  just need to keep me a few more days and then discharge me on Friday morning at seven".  Patient admitted to no current psychiatric reasons to still be at [facility]  No SI/HI or other symptoms.  A. Continued to go back and forth with patient about need for discharge today and agreed team would set up for follow up medical appointment as Shelda Jakes. PA reported patient had a diabetic consult as is currently now considered diabetic with recent HgbA1C of 6.6.  Recommendations was for patient to follow up in outpatient once released and no current medications ordered for discharge to treat diabetes.  Patient was also informed she is currently taking anti-seizure medications for mood and not for seizure history.  Arranged for treatment team to set up medical appointment for follow up on diagnosed diabetes and encouraged patient to keep all follow up appointments.  Patient stated plan to call sister and to try to obtain money for one night stay at a hotel as informed the [facility] shelter thought they would have a bed on tomorrow.  Patient also encouraged if could not arrange this to go to [facility] today as they still may possibly get her a bed.  R. Patient not happy with plan to discharge today as  does not have a definite plan in place.  Patient set up for [facility] appointment this week to follow up on diabetes and hypertension.  Patient to have sample medications to leave with today.  Staff to contact this Chiropodist back today if problems arise with current discharge plans.  Team still agrees currently no need to stop discharge today as patient without any current symptoms, no SI/HI and stable on current psychiatric medications.

## 2012-09-27 NOTE — Progress Notes (Signed)
Psychoeducational Group Note  Date:  09/27/2012 Time:  2000  Group Topic/Focus:  Wrap-Up Group:   The focus of this group is to help patients review their daily goal of treatment and discuss progress on daily workbooks.  Participation Level:  Active  Participation Quality:  Appropriate  Affect:  Appropriate  Cognitive:  Appropriate  Insight:  Good  Engagement in Group:  Good  Additional Comments:  Patient attended and participated in group tonight. She reports having a good day. She advised that she will be going home tomorrow. Her support system is her mother right now.  Lita Mains [facility] 09/27/2012, 1:57 AM

## 2012-09-27 NOTE — Progress Notes (Signed)
Psychoeducational Group Note  Date:  09/27/2012 Time:  1100  Group Topic/Focus:  Self Care:   The focus of this group is to help patients understand the importance of self-care in order to improve or restore emotional, physical, spiritual, interpersonal, and financial health.  Participation Level:  Minimal  Participation Quality:  Drowsy  Affect:  Flat  Cognitive:  Appropriate  Insight:  Good  Engagement in Group:  Good  Additional Comments:  Pt feel asleep a few times during group.  Isla Pence M 09/27/2012, 11:41 AM

## 2012-09-27 NOTE — Progress Notes (Signed)
Nutrition Brief Note  Pt stops RD in hallway and requests diet change to "diabetic diet."  Body mass index is 41.92 kg/(m^2). Pt meets criteria for morbidly obese based on current BMI.   Current diet order is Heart Healthy, patient is consuming approximately 100% of meals at this time. Labs and medications reviewed.   Pt initially requests diet change, then changes her mind stating "Nevermind. I want to try and do good and manage it myself." No nutrition interventions warranted at this time. If nutrition issues arise, please consult RD.   Loyce Dys, MS RD LDN Clinical Inpatient Dietitian Pager: [phone] Weekend/After hours pager: [phone]
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
## 2012-09-27 NOTE — [facility] Suicide Risk Assessment (Signed)
Suicide Risk Assessment  Discharge Assessment     Demographic Factors:  female  Mental Status Per Nursing Assessment::   On Admission:     Current Mental Status by Physician: patient denies suicidal ideations, intent or plan  Loss Factors: Decrease in vocational status and Financial problems/change in socioeconomic status  Historical Factors: Impulsivity  Risk Reduction Factors:   Positive therapeutic relationship  Continued Clinical Symptoms:  Resolving depressive symptoms  Cognitive Features That Contribute To Risk:  Closed-mindedness Polarized thinking    Suicide Risk:  Minimal: No identifiable suicidal ideation.  Patients presenting with no risk factors but with morbid ruminations; may be classified as minimal risk based on the severity of the depressive symptoms  Discharge Diagnoses:   AXIS I:  Major Depression, Recurrent severe AXIS II:  Cluster C Traits AXIS III:   Past Medical History  Diagnosis Date   AXIS IV:  housing problems and other psychosocial or environmental problems AXIS V:  61-70 mild symptoms  Plan Of Care/Follow-up recommendations:  Activity:  as tolerated Diet:  healthy Tests:  routine Other:  patient to keep after care appointment  Is patient on multiple antipsychotic therapies at discharge:  No   Has Patient had three or more failed trials of antipsychotic monotherapy by history:  No  Recommended Plan for Multiple Antipsychotic Therapies: N/A  Thedore Mins, MD 09/27/2012, 11:06 AM

## 2012-09-29 NOTE — Progress Notes (Signed)
Patient Discharge Instructions:  After Visit Summary (AVS):   Faxed to:  09/29/12 Psychiatric Admission Assessment Note:   Faxed to:  09/29/12 Suicide Risk Assessment - Discharge Assessment:   Faxed to:  09/29/12 Faxed/Sent to the Next Level Care provider:  09/29/12 Faxed to [facility] @ [phone]  Jerelene Redden, 09/29/2012, 3:43 PM

## 2012-10-01 NOTE — Progress Notes (Signed)
Agree with assessment and plan Marsena Taff A. Coreyon Nicotra, M.D. 

## 2012-10-01 NOTE — Progress Notes (Signed)
Agree with assessment and plan Eulis Salazar A. Carry Weesner, M.D. 

## 2012-10-01 NOTE — Progress Notes (Signed)
Agree with assessment and plan Dastan Krider A. Candita Borenstein, M.D. 

## 2012-10-01 NOTE — Progress Notes (Signed)
Agree with assessment and plan Keiasia Christianson A. Tywone Bembenek, M.D. 

## 2012-10-05 NOTE — Discharge Summary (Signed)
Seen and agreed. Weldon Nouri, MD 

## 2012-10-23 ENCOUNTER — Encounter (HOSPITAL_COMMUNITY): Payer: Self-pay | Admitting: Family Medicine

## 2012-10-23 ENCOUNTER — Emergency Department (HOSPITAL_COMMUNITY)
Admission: EM | Admit: 2012-10-23 | Discharge: 2012-10-24 | Disposition: A | Discharge status: Home / Self Care | Payer: Self-pay | Attending: Emergency Medicine | Admitting: Emergency Medicine

## 2012-10-23 DIAGNOSIS — F329 Major depressive disorder, single episode, unspecified: Secondary | ICD-10-CM | POA: Insufficient documentation

## 2012-10-23 DIAGNOSIS — E119 Type 2 diabetes mellitus without complications: Secondary | ICD-10-CM | POA: Insufficient documentation

## 2012-10-23 DIAGNOSIS — F411 Generalized anxiety disorder: Secondary | ICD-10-CM | POA: Insufficient documentation

## 2012-10-23 DIAGNOSIS — K529 Noninfective gastroenteritis and colitis, unspecified: Secondary | ICD-10-CM

## 2012-10-23 DIAGNOSIS — R197 Diarrhea, unspecified: Secondary | ICD-10-CM | POA: Insufficient documentation

## 2012-10-23 DIAGNOSIS — Z79899 Other long term (current) drug therapy: Secondary | ICD-10-CM | POA: Insufficient documentation

## 2012-10-23 DIAGNOSIS — K5289 Other specified noninfective gastroenteritis and colitis: Secondary | ICD-10-CM | POA: Insufficient documentation

## 2012-10-23 HISTORY — DX: Type 2 diabetes mellitus without complications: E11.9

## 2012-10-23 NOTE — ED Notes (Signed)
[id number]<AV> Expected date:10/23/12<BR> Expected time:10:30 PM<BR> Means of arrival:<BR> Comments:<BR> 46 yo F N/V

## 2012-10-23 NOTE — ED Notes (Addendum)
Per EMS, patient here for evaluation of nausea, vomiting and diarrhea. Patient actively vomiting large amount of gastric contents upon arrival to ER. [facility] and [facility]. CBG 89 en route by EMS.

## 2012-10-24 LAB — CBC WITH DIFFERENTIAL/PLATELET
Basophils Absolute: 0 10*3/uL (ref 0.0–0.1)
Basophils Relative: 0 % (ref 0–1)
Eosinophils Relative: 1 % (ref 0–5)
Lymphocytes Relative: 14 % (ref 12–46)
MCV: 79.8 fL (ref 78.0–100.0)
Platelets: 280 10*3/uL (ref 150–400)
RDW: 14.5 % (ref 11.5–15.5)
WBC: 7.1 10*3/uL (ref 4.0–10.5)

## 2012-10-24 LAB — URINALYSIS, ROUTINE W REFLEX MICROSCOPIC
Hgb urine dipstick: NEGATIVE
Leukocytes, UA: NEGATIVE
Protein, ur: NEGATIVE mg/dL
Urobilinogen, UA: 0.2 mg/dL (ref 0.0–1.0)

## 2012-10-24 LAB — POCT PREGNANCY, URINE: Preg Test, Ur: NEGATIVE

## 2012-10-24 MED ORDER — PROMETHAZINE HCL 25 MG/ML IJ SOLN
25.0000 mg | Freq: Once | INTRAMUSCULAR | Status: AC
  Administered 2012-10-24: 25 mg via INTRAVENOUS
  Filled 2012-10-24: qty 1

## 2012-10-24 MED ORDER — PROMETHAZINE HCL 25 MG PO TABS: 25.0000 mg | ORAL_TABLET | Freq: Four times a day (QID) | ORAL | Status: DC | PRN

## 2012-10-24 MED ORDER — SODIUM CHLORIDE 0.9 % IV BOLUS (SEPSIS)
1000.0000 mL | Freq: Once | INTRAVENOUS | Status: AC
  Administered 2012-10-24: 1000 mL via INTRAVENOUS

## 2012-10-24 MED ORDER — ONDANSETRON HCL 4 MG/2ML IJ SOLN
4.0000 mg | Freq: Once | INTRAMUSCULAR | Status: AC
  Administered 2012-10-24: 4 mg via INTRAVENOUS
  Filled 2012-10-24: qty 2

## 2012-10-24 NOTE — ED Provider Notes (Signed)
Medical screening examination/treatment/procedure(s) were performed by non-physician practitioner and as supervising physician I was immediately available for consultation/collaboration.   Raiza Kiesel L Elanor Cale, MD 10/24/12 0729 

## 2012-10-24 NOTE — ED Provider Notes (Signed)
History     CSN: [id number]  Arrival date & time 10/23/12  2247   First MD Initiated Contact with Patient 10/24/12 0142      Chief Complaint  Patient presents with  . Nausea, vomiting and diarrhea     (Consider location/radiation/quality/duration/timing/severity/associated sxs/prior treatment) HPI History provided by pt.   Pt reports N/V/D since yesterday.  Has been staying at the [facility] and believes that something she ate there  disagreed with her.  No associated fever, CP, abdominal pain, urinary sx.  No known sick contacts.  No h/o abdominal surgeries.   Past Medical History  Diagnosis Date  . Anxiety   . Major depressive disorder, recurrent, severe with psychotic features   . Diabetes mellitus without complication     History reviewed. No pertinent past surgical history.  Family History  Problem Relation Age of Onset  . Hypertension Other   . Diabetes Other     History  Substance Use Topics  . Smoking status: Never Smoker   . Smokeless tobacco: Never Used  . Alcohol Use: Yes     Comment: socially    OB History    Grav Para Term Preterm Abortions TAB SAB Ect Mult Living                  Review of Systems  All other systems reviewed and are negative.    Allergies  Review of patient's allergies indicates no known allergies.  Home Medications   Current Outpatient Rx  Name  Route  Sig  Dispense  Refill  . BENZTROPINE MESYLATE 1 MG PO TABS   Oral   Take 1 tablet (1 mg total) by mouth 2 (two) times daily. For side effects.   60 tablet   0   . CITALOPRAM HYDROBROMIDE 20 MG PO TABS   Oral   Take 1 tablet (20 mg total) by mouth daily. For depression.   30 tablet   0   . DIVALPROEX SODIUM ER 500 MG PO TB24   Oral   Take 2 tablets (1,000 mg total) by mouth at bedtime. For mood stabilization.   60 tablet   0   . LISINOPRIL 5 MG PO TABS   Oral   Take 1 tablet (5 mg total) by mouth daily. For hypertension.   30 tablet   0   . RISPERIDONE 3 MG PO  TBDP   Oral   Take 1 tablet (3 mg total) by mouth 2 (two) times daily.   60 tablet   0   . TRAZODONE HCL 100 MG PO TABS   Oral   Take 1 tablet (100 mg total) by mouth at bedtime as needed for sleep (and may repeat in 1 hour if ineffective). For insomnia.   30 tablet   0     BP 115/79  Pulse 93  Temp 98.6 F (37 C) (Oral)  Resp 24  SpO2 100%  LMP 10/29/2011  Physical Exam  Nursing note and vitals reviewed. Constitutional: She is oriented to person, place, and time. She appears well-developed and well-nourished. No distress.  HENT:  Head: Normocephalic and atraumatic.  Mouth/Throat: Oropharynx is clear and moist.  Eyes:       Normal appearance  Neck: Normal range of motion.  Cardiovascular: Normal rate and regular rhythm.   Pulmonary/Chest: Effort normal and breath sounds normal. No respiratory distress.  Abdominal: Soft. Bowel sounds are normal. She exhibits no distension and no mass. There is no tenderness. There is no rebound and  no guarding.  Genitourinary:       No CVA tenderness  Musculoskeletal: Normal range of motion.  Neurological: She is alert and oriented to person, place, and time.  Skin: Skin is warm and dry. No rash noted.  Psychiatric: She has a normal mood and affect. Her behavior is normal.    ED Course  Procedures (including critical care time)  Labs Reviewed  CBC WITH DIFFERENTIAL - Abnormal; Notable for the following:    Hemoglobin 11.7 (*)     MCH 25.4 (*)     All other components within normal limits  URINALYSIS, ROUTINE W REFLEX MICROSCOPIC  POCT PREGNANCY, URINE   No results found.   1. Gastroenteritis       MDM  Healthy, homeless, 46yo F presents w/ c/o N/V/D since yesterday.  I suspect that patient is really here to sleep.  She is afebrile,  well-hydrated, abdomen benign on exam.  Sleeping every time I enter room, tolerating pos and has not had any vomiting in ED.  When I told her I would prescribe an anti-emetic, she said it was  unnecessary.  Return precautions discussed.         Otilio Miu, PA-C 10/24/12 [id number]

## 2012-10-24 NOTE — ED Notes (Signed)
Patient requested water to drink instead of ginger ale; patient given water.

## 2012-11-22 ENCOUNTER — Encounter (HOSPITAL_COMMUNITY): Payer: Self-pay | Admitting: *Deleted

## 2012-11-22 ENCOUNTER — Emergency Department (HOSPITAL_COMMUNITY)
Admission: EM | Admit: 2012-11-22 | Discharge: 2012-11-22 | Disposition: A | Discharge status: Home / Self Care | Payer: Self-pay | Attending: Emergency Medicine | Admitting: Emergency Medicine

## 2012-11-22 DIAGNOSIS — R5381 Other malaise: Secondary | ICD-10-CM | POA: Insufficient documentation

## 2012-11-22 DIAGNOSIS — R42 Dizziness and giddiness: Secondary | ICD-10-CM | POA: Insufficient documentation

## 2012-11-22 DIAGNOSIS — R531 Weakness: Secondary | ICD-10-CM

## 2012-11-22 DIAGNOSIS — Z79899 Other long term (current) drug therapy: Secondary | ICD-10-CM | POA: Insufficient documentation

## 2012-11-22 DIAGNOSIS — E119 Type 2 diabetes mellitus without complications: Secondary | ICD-10-CM | POA: Insufficient documentation

## 2012-11-22 DIAGNOSIS — F333 Major depressive disorder, recurrent, severe with psychotic symptoms: Secondary | ICD-10-CM | POA: Insufficient documentation

## 2012-11-22 DIAGNOSIS — R5383 Other fatigue: Secondary | ICD-10-CM | POA: Insufficient documentation

## 2012-11-22 LAB — CBC WITH DIFFERENTIAL/PLATELET
Eosinophils Absolute: 0.2 10*3/uL (ref 0.0–0.7)
Eosinophils Relative: 3 % (ref 0–5)
HCT: 34.5 % — ABNORMAL LOW (ref 36.0–46.0)
Lymphocytes Relative: 40 % (ref 12–46)
Lymphs Abs: 2.1 10*3/uL (ref 0.7–4.0)
MCH: 25.5 pg — ABNORMAL LOW (ref 26.0–34.0)
MCV: 79.3 fL (ref 78.0–100.0)
Monocytes Absolute: 0.3 10*3/uL (ref 0.1–1.0)
RDW: 13.9 % (ref 11.5–15.5)
WBC: 5.3 10*3/uL (ref 4.0–10.5)

## 2012-11-22 LAB — BASIC METABOLIC PANEL
CO2: 22 mEq/L (ref 19–32)
Calcium: 8.9 mg/dL (ref 8.4–10.5)
Creatinine, Ser: 0.88 mg/dL (ref 0.50–1.10)
Glucose, Bld: 112 mg/dL — ABNORMAL HIGH (ref 70–99)

## 2012-11-22 MED ORDER — SODIUM CHLORIDE 0.9 % IV BOLUS (SEPSIS)
1000.0000 mL | Freq: Once | INTRAVENOUS | Status: AC
  Administered 2012-11-22: 1000 mL via INTRAVENOUS
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
## 2012-11-22 NOTE — ED Notes (Signed)
Pt states she is out of all her meds- denies SI/HI- states she feels dizzy and lightheaded

## 2012-11-22 NOTE — ED Provider Notes (Signed)
History     CSN: [id number]  Arrival date & time 11/22/12  [name]   First MD Initiated Contact with Patient 11/22/12 2140      Chief Complaint  Patient presents with  . Anxiety  . Dizziness    (Consider location/radiation/quality/duration/timing/severity/associated sxs/prior treatment) Patient is a 47 y.o. female presenting with weakness. The history is provided by the patient (the pt states she felt weak and dizzy today). No language interpreter was used.  Weakness Primary symptoms do not include headaches or seizures. The symptoms began 12 to 24 hours ago. The symptoms are waxing and waning. The neurological symptoms are diffuse. Context: nothing.  Additional symptoms include weakness. Additional symptoms do not include hallucinations.    Past Medical History  Diagnosis Date  . Anxiety   . Major depressive disorder, recurrent, severe with psychotic features   . Diabetes mellitus without complication     History reviewed. No pertinent past surgical history.  Family History  Problem Relation Age of Onset  . Hypertension Other   . Diabetes Other     History  Substance Use Topics  . Smoking status: Never Smoker   . Smokeless tobacco: Never Used  . Alcohol Use: Yes     Comment: socially    OB History    Grav Para Term Preterm Abortions TAB SAB Ect Mult Living                  Review of Systems  Constitutional: Negative for fatigue.  HENT: Negative for congestion, sinus pressure and ear discharge.   Eyes: Negative for discharge.  Respiratory: Negative for cough.   Cardiovascular: Negative for chest pain.  Gastrointestinal: Negative for abdominal pain and diarrhea.  Genitourinary: Negative for frequency and hematuria.  Musculoskeletal: Negative for back pain.  Skin: Negative for rash.  Neurological: Positive for weakness. Negative for seizures and headaches.  Hematological: Negative.   Psychiatric/Behavioral: Negative for hallucinations.    Allergies    Review of patient's allergies indicates no known allergies.  Home Medications   Current Outpatient Rx  Name  Route  Sig  Dispense  Refill  . BENZTROPINE MESYLATE 1 MG PO TABS   Oral   Take 1 tablet (1 mg total) by mouth 2 (two) times daily. For side effects.   60 tablet   0   . CITALOPRAM HYDROBROMIDE 20 MG PO TABS   Oral   Take 1 tablet (20 mg total) by mouth daily. For depression.   30 tablet   0   . DIVALPROEX SODIUM ER 500 MG PO TB24   Oral   Take 2 tablets (1,000 mg total) by mouth at bedtime. For mood stabilization.   60 tablet   0   . LISINOPRIL 5 MG PO TABS   Oral   Take 1 tablet (5 mg total) by mouth daily. For hypertension.   30 tablet   0   . PROMETHAZINE HCL 25 MG PO TABS   Oral   Take 1 tablet (25 mg total) by mouth every 6 (six) hours as needed for nausea.   20 tablet   0   . RISPERIDONE 3 MG PO TBDP   Oral   Take 1 tablet (3 mg total) by mouth 2 (two) times daily.   60 tablet   0   . TRAZODONE HCL 100 MG PO TABS   Oral   Take 1 tablet (100 mg total) by mouth at bedtime as needed for sleep (and may repeat in 1 hour if ineffective).  For insomnia.   30 tablet   0     BP 139/97  Pulse 97  Temp 98.8 F (37.1 C) (Oral)  Resp 20  SpO2 97%  LMP 10/29/2011  Physical Exam  Constitutional: She is oriented to person, place, and time. She appears well-developed.  HENT:  Head: Normocephalic and atraumatic.  Eyes: Conjunctivae normal and EOM are normal. No scleral icterus.  Neck: Neck supple. No thyromegaly present.  Cardiovascular: Normal rate and regular rhythm.  Exam reveals no gallop and no friction rub.   No murmur heard. Pulmonary/Chest: No stridor. She has no wheezes. She has no rales. She exhibits no tenderness.  Abdominal: She exhibits no distension. There is no tenderness. There is no rebound.  Musculoskeletal: Normal range of motion. She exhibits no edema.  Lymphadenopathy:    She has no cervical adenopathy.  Neurological: She is  oriented to person, place, and time. Coordination normal.  Skin: No rash noted. No erythema.  Psychiatric: She has a normal mood and affect. Her behavior is normal.    ED Course  Procedures (including critical care time)  Labs Reviewed  CBC WITH DIFFERENTIAL - Abnormal; Notable for the following:    Hemoglobin 11.1 (*)     HCT 34.5 (*)     MCH 25.5 (*)     All other components within normal limits  BASIC METABOLIC PANEL - Abnormal; Notable for the following:    Glucose, Bld 112 (*)     GFR calc non Af Amer 79 (*)     All other components within normal limits   No results found.   1. Weakness       MDM  Pt mildly anemic other wise normal labs.  Pt improved with hydration and will follow up        Benny Lennert, MD 11/22/12 2324

## 2012-12-25 ENCOUNTER — Emergency Department (HOSPITAL_COMMUNITY)
Admission: EM | Admit: 2012-12-25 | Discharge: 2012-12-25 | Disposition: A | Discharge status: Home / Self Care | Payer: Self-pay | Attending: Emergency Medicine | Admitting: Emergency Medicine

## 2012-12-25 ENCOUNTER — Emergency Department (HOSPITAL_COMMUNITY): Payer: Self-pay

## 2012-12-25 DIAGNOSIS — J069 Acute upper respiratory infection, unspecified: Secondary | ICD-10-CM | POA: Insufficient documentation

## 2012-12-25 DIAGNOSIS — E119 Type 2 diabetes mellitus without complications: Secondary | ICD-10-CM | POA: Insufficient documentation

## 2012-12-25 DIAGNOSIS — R05 Cough: Secondary | ICD-10-CM | POA: Insufficient documentation

## 2012-12-25 DIAGNOSIS — Z8659 Personal history of other mental and behavioral disorders: Secondary | ICD-10-CM | POA: Insufficient documentation

## 2012-12-25 DIAGNOSIS — R6883 Chills (without fever): Secondary | ICD-10-CM | POA: Insufficient documentation

## 2012-12-25 DIAGNOSIS — R059 Cough, unspecified: Secondary | ICD-10-CM | POA: Insufficient documentation

## 2012-12-25 DIAGNOSIS — J3489 Other specified disorders of nose and nasal sinuses: Secondary | ICD-10-CM | POA: Insufficient documentation

## 2012-12-25 LAB — GLUCOSE, CAPILLARY: Glucose-Capillary: 95 mg/dL (ref 70–99)

## 2012-12-25 MED ORDER — HYDROCODONE-HOMATROPINE 5-1.5 MG/5ML PO SYRP: 5.0000 mL | ORAL_SOLUTION | Freq: Four times a day (QID) | ORAL | Status: DC | PRN

## 2012-12-25 MED ORDER — ALBUTEROL SULFATE HFA 108 (90 BASE) MCG/ACT IN AERS
2.0000 | INHALATION_SPRAY | RESPIRATORY_TRACT | Status: DC | PRN
  Administered 2012-12-25: 2 via RESPIRATORY_TRACT
  Filled 2012-12-25: qty 6.7

## 2012-12-25 MED ORDER — PREDNISONE 20 MG PO TABS: 40.0000 mg | ORAL_TABLET | Freq: Every day | ORAL | Status: DC

## 2012-12-25 NOTE — ED Notes (Signed)
Pt returned to ED and requested evaluation. Pt alert, oriented. Charge RN advised, PA advised

## 2012-12-25 NOTE — ED Provider Notes (Signed)
History     CSN: [id number]  Arrival date & time 12/25/12  1216   First MD Initiated Contact with Patient 12/25/12 1250      Chief Complaint  Patient presents with  . URI    (Consider location/radiation/quality/duration/timing/severity/associated sxs/prior treatment) Patient is a 47 y.o. female presenting with cough. The history is provided by the patient.  Cough Cough characteristics:  Productive Sputum characteristics:  Nondescript Onset quality:  Gradual Duration:  2 weeks Progression:  Worsening Smoker: no   Context: not sick contacts   Relieved by:  Nothing Ineffective treatments:  Cough suppressants Associated symptoms: chills and rhinorrhea   Associated symptoms: no chest pain, no ear pain, no fever, no shortness of breath, no sinus congestion and no wheezing     Past Medical History  Diagnosis Date  . Anxiety   . Major depressive disorder, recurrent, severe with psychotic features   . Diabetes mellitus without complication     No past surgical history on file.  Family History  Problem Relation Age of Onset  . Hypertension Other   . Diabetes Other     History  Substance Use Topics  . Smoking status: Never Smoker   . Smokeless tobacco: Never Used  . Alcohol Use: Yes     Comment: socially    OB History   Grav Para Term Preterm Abortions TAB SAB Ect Mult Living                  Review of Systems  Constitutional: Positive for chills. Negative for fever.  HENT: Positive for congestion and rhinorrhea. Negative for ear pain.   Respiratory: Positive for cough. Negative for shortness of breath and wheezing.   Cardiovascular: Negative for chest pain.  All other systems reviewed and are negative.    Allergies  Review of patient's allergies indicates no known allergies.  Home Medications  No current outpatient prescriptions on file.  BP 120/94  Pulse 88  Temp(Src) 98.5 F (36.9 C) (Oral)  Resp 18  Ht 5' 5.5" (1.664 m)  Wt 250 lb (113.399 kg)   BMI 40.95 kg/m2  SpO2 99%  LMP 12/25/2009  Physical Exam  Nursing note and vitals reviewed. Constitutional: She appears well-developed and well-nourished. No distress.  HENT:  Head: Normocephalic and atraumatic.  Right Ear: Tympanic membrane and ear canal normal.  Left Ear: Tympanic membrane and ear canal normal.  Nose: Mucosal edema present. Right sinus exhibits no maxillary sinus tenderness and no frontal sinus tenderness. Left sinus exhibits no maxillary sinus tenderness and no frontal sinus tenderness.  Mouth/Throat: Oropharynx is clear and moist.  Neck: Normal range of motion. Neck supple.  Cardiovascular: Normal rate, regular rhythm and normal heart sounds.   Pulmonary/Chest: Effort normal and breath sounds normal. No respiratory distress. She has no wheezes. She has no rales.  Abdominal: Bowel sounds are normal.  Neurological: She is alert.  Skin: Skin is warm and dry. She is not diaphoretic.    ED Course  Procedures (including critical care time)  Labs Reviewed  GLUCOSE, CAPILLARY   Dg Chest 2 View  12/25/2012  *RADIOLOGY REPORT*  Clinical Data: Productive cough  CHEST - 2 VIEW  Comparison: 03/18/2012  Findings: Lungs are clear. No pleural effusion or pneumothorax.  The heart is top normal in size.  Mild degenerative changes of the visualized thoracolumbar spine.  IMPRESSION: No evidence of acute cardiopulmonary disease.   Original Report Authenticated By: Charline Bills, M.D.      1. URI (upper respiratory infection)  MDM  Pt CXR negative for acute infiltrate. Patients symptoms are consistent with URI, likely viral etiology. Discussed that antibiotics are not indicated for viral infections. Pt will be discharged with symptomatic treatment.  Verbalizes understanding and is agreeable with plan. Pt is hemodynamically stable & in NAD prior to dc.        Pascal Lux [place], PA-C 12/25/12 1701

## 2012-12-25 NOTE — ED Notes (Signed)
She c/o cough/cold/u.r.i. Sx x ~2 weeks.  She is in no distress.

## 2012-12-26 NOTE — ED Provider Notes (Signed)
Medical screening examination/treatment/procedure(s) were performed by non-physician practitioner and as supervising physician I was immediately available for consultation/collaboration.  Eulan Heyward, MD 12/26/12 0658 

## 2014-03-04 ENCOUNTER — Emergency Department (HOSPITAL_COMMUNITY): Payer: Self-pay

## 2014-03-04 ENCOUNTER — Encounter (HOSPITAL_COMMUNITY): Payer: Self-pay | Admitting: Emergency Medicine

## 2014-03-04 ENCOUNTER — Emergency Department (HOSPITAL_COMMUNITY)
Admission: EM | Admit: 2014-03-04 | Discharge: 2014-03-04 | Disposition: A | Discharge status: Home / Self Care | Payer: Self-pay | Attending: Emergency Medicine | Admitting: Emergency Medicine

## 2014-03-04 DIAGNOSIS — R1084 Generalized abdominal pain: Secondary | ICD-10-CM | POA: Insufficient documentation

## 2014-03-04 DIAGNOSIS — Z3202 Encounter for pregnancy test, result negative: Secondary | ICD-10-CM | POA: Insufficient documentation

## 2014-03-04 DIAGNOSIS — R110 Nausea: Secondary | ICD-10-CM | POA: Insufficient documentation

## 2014-03-04 DIAGNOSIS — Z8659 Personal history of other mental and behavioral disorders: Secondary | ICD-10-CM | POA: Insufficient documentation

## 2014-03-04 DIAGNOSIS — E119 Type 2 diabetes mellitus without complications: Secondary | ICD-10-CM | POA: Insufficient documentation

## 2014-03-04 DIAGNOSIS — R109 Unspecified abdominal pain: Secondary | ICD-10-CM

## 2014-03-04 LAB — COMPREHENSIVE METABOLIC PANEL
ALK PHOS: 141 U/L — AB (ref 39–117)
ALT: 25 U/L (ref 0–35)
AST: 22 U/L (ref 0–37)
Albumin: 3.7 g/dL (ref 3.5–5.2)
BUN: 17 mg/dL (ref 6–23)
CHLORIDE: 104 meq/L (ref 96–112)
CO2: 25 mEq/L (ref 19–32)
Calcium: 9.1 mg/dL (ref 8.4–10.5)
Creatinine, Ser: 0.81 mg/dL (ref 0.50–1.10)
GFR calc non Af Amer: 86 mL/min — ABNORMAL LOW (ref >90)
GLUCOSE: 118 mg/dL — AB (ref 70–99)
POTASSIUM: 3.7 meq/L (ref 3.7–5.3)
SODIUM: 143 meq/L (ref 137–147)
Total Protein: 7.1 g/dL (ref 6.0–8.3)

## 2014-03-04 LAB — URINALYSIS, ROUTINE W REFLEX MICROSCOPIC
BILIRUBIN URINE: NEGATIVE
Bilirubin Urine: NEGATIVE
Glucose, UA: NEGATIVE mg/dL
Glucose, UA: NEGATIVE mg/dL
HGB URINE DIPSTICK: NEGATIVE
Hgb urine dipstick: NEGATIVE
KETONES UR: NEGATIVE mg/dL
KETONES UR: NEGATIVE mg/dL
LEUKOCYTES UA: NEGATIVE
NITRITE: NEGATIVE
NITRITE: NEGATIVE
PH: 5.5 (ref 5.0–8.0)
PH: 6.5 (ref 5.0–8.0)
Protein, ur: NEGATIVE mg/dL
Protein, ur: NEGATIVE mg/dL
SPECIFIC GRAVITY, URINE: 1.025 (ref 1.005–1.030)
Specific Gravity, Urine: 1.015 (ref 1.005–1.030)
UROBILINOGEN UA: 0.2 mg/dL (ref 0.0–1.0)
Urobilinogen, UA: 1 mg/dL (ref 0.0–1.0)

## 2014-03-04 LAB — CBC WITH DIFFERENTIAL/PLATELET
Basophils Absolute: 0 10*3/uL (ref 0.0–0.1)
Basophils Relative: 0 % (ref 0–1)
Eosinophils Absolute: 0.2 10*3/uL (ref 0.0–0.7)
Eosinophils Relative: 2 % (ref 0–5)
HCT: 35.9 % — ABNORMAL LOW (ref 36.0–46.0)
HEMOGLOBIN: 11.6 g/dL — AB (ref 12.0–15.0)
LYMPHS ABS: 1.4 10*3/uL (ref 0.7–4.0)
LYMPHS PCT: 20 % (ref 12–46)
MCH: 26.1 pg (ref 26.0–34.0)
MCHC: 32.3 g/dL (ref 30.0–36.0)
MCV: 80.7 fL (ref 78.0–100.0)
MONOS PCT: 4 % (ref 3–12)
Monocytes Absolute: 0.3 10*3/uL (ref 0.1–1.0)
NEUTROS ABS: 5.1 10*3/uL (ref 1.7–7.7)
NEUTROS PCT: 73 % (ref 43–77)
PLATELETS: 273 10*3/uL (ref 150–400)
RBC: 4.45 MIL/uL (ref 3.87–5.11)
RDW: 13.5 % (ref 11.5–15.5)
WBC: 7 10*3/uL (ref 4.0–10.5)

## 2014-03-04 LAB — URINE MICROSCOPIC-ADD ON

## 2014-03-04 LAB — RAPID URINE DRUG SCREEN, HOSP PERFORMED
AMPHETAMINES: NOT DETECTED
BENZODIAZEPINES: NOT DETECTED
Barbiturates: NOT DETECTED
Cocaine: NOT DETECTED
OPIATES: NOT DETECTED
TETRAHYDROCANNABINOL: NOT DETECTED

## 2014-03-04 LAB — ETHANOL

## 2014-03-04 LAB — POC URINE PREG, ED: Preg Test, Ur: NEGATIVE

## 2014-03-04 LAB — LIPASE, BLOOD: LIPASE: 31 U/L (ref 11–59)

## 2014-03-04 MED ORDER — SODIUM CHLORIDE 0.9 % IV SOLN
1000.0000 mL | INTRAVENOUS | Status: DC
  Administered 2014-03-04: 1000 mL via INTRAVENOUS

## 2014-03-04 MED ORDER — PROMETHAZINE HCL 25 MG PO TABS: 25.0000 mg | ORAL_TABLET | Freq: Four times a day (QID) | ORAL | Status: DC | PRN

## 2014-03-04 MED ORDER — METOCLOPRAMIDE HCL 5 MG/ML IJ SOLN
10.0000 mg | Freq: Once | INTRAMUSCULAR | Status: AC
  Administered 2014-03-04: 10 mg via INTRAVENOUS
  Filled 2014-03-04: qty 2

## 2014-03-04 MED ORDER — ONDANSETRON HCL 4 MG/2ML IJ SOLN
4.0000 mg | Freq: Once | INTRAMUSCULAR | Status: AC
  Administered 2014-03-04: 4 mg via INTRAVENOUS
  Filled 2014-03-04: qty 2

## 2014-03-04 NOTE — ED Provider Notes (Signed)
Medical screening examination/treatment/procedure(s) were conducted as a shared visit with non-physician practitioner(s) and myself.  I personally evaluated the patient during the encounter.   EKG Interpretation None       Orlie Dakin, MD 03/04/14 2341

## 2014-03-04 NOTE — ED Provider Notes (Signed)
CSN: [id number]     Arrival date & time 03/04/14  1545 History   First MD Initiated Contact with Patient 03/04/14 1546     Chief Complaint  Patient presents with  . Abdominal Pain  . Nausea     (Consider location/radiation/quality/duration/timing/severity/associated sxs/prior Treatment) Patient is a 48 y.o. female presenting with abdominal pain. The history is provided by the patient and medical records. No language interpreter was used.  Abdominal Pain Associated symptoms: nausea   Associated symptoms: no chest pain, no constipation, no cough, no diarrhea, no dysuria, no fatigue, no fever, no hematuria, no shortness of breath and no vomiting     [name] is a 48 y.o. female  with a hx of anxiety, MDD, NIDDM presents to the Emergency Department complaining of gradual, persistent, progressively worsening generalized abd pain onset immediately after eating a cheeseburger and drinking a margarita. Pt brought in via EMS who reports she was lying on the ground, awake outside the restaurant. Pt and EMS deny emesis.  Pt denies other somatic symptoms, but is largely uncooperative with history taking and exam.  No aggravating or alleviating symptoms.  EMS administered zofran PTA, but pt reports persistent nausea. Pt denies fever, chills, headache, neck pain, CP, SOB, vomiting, diarrhea, weakness, dizziness, syncope, dysuria.       Past Medical History  Diagnosis Date  . Anxiety   . Major depressive disorder, recurrent, severe with psychotic features   . Diabetes mellitus without complication    History reviewed. No pertinent past surgical history. Family History  Problem Relation Age of Onset  . Hypertension Other   . Diabetes Other    History  Substance Use Topics  . Smoking status: Never Smoker   . Smokeless tobacco: Never Used  . Alcohol Use: Yes     Comment: socially   OB History   Grav Para Term Preterm Abortions TAB SAB Ect Mult Living                 Review of Systems   Constitutional: Negative for fever, diaphoresis, appetite change, fatigue and unexpected weight change.  HENT: Negative for mouth sores and trouble swallowing.   Respiratory: Negative for cough, chest tightness, shortness of breath, wheezing and stridor.   Cardiovascular: Negative for chest pain and palpitations.  Gastrointestinal: Positive for nausea and abdominal pain. Negative for vomiting, diarrhea, constipation, blood in stool, abdominal distention and rectal pain.  Genitourinary: Negative for dysuria, urgency, frequency, hematuria, flank pain and difficulty urinating.  Musculoskeletal: Negative for back pain, neck pain and neck stiffness.  Skin: Negative for rash.  Neurological: Negative for weakness.  Hematological: Negative for adenopathy.  Psychiatric/Behavioral: Negative for confusion.  All other systems reviewed and are negative.     Allergies  Review of patient's allergies indicates no known allergies.  Home Medications   Prior to Admission medications   Not on File   BP 132/66  Pulse 81  Temp(Src) 97.5 F (36.4 C) (Oral)  Resp 18  SpO2 96% Physical Exam  Nursing note and vitals reviewed. Constitutional: She is oriented to person, place, and time. She appears well-developed and well-nourished. No distress.  Awake, alert, nontoxic appearance  HENT:  Head: Normocephalic and atraumatic.  Nose: Nose normal.  Mouth/Throat: Oropharynx is clear and moist. No oropharyngeal exudate.  Eyes: Conjunctivae and EOM are normal. Pupils are equal, round, and reactive to light. No scleral icterus.  Neck: Normal range of motion. Neck supple.  No midline or paraspinal tenderness Full ROM without pain  Cardiovascular: Normal rate, regular rhythm, normal heart sounds and intact distal pulses.   No murmur heard. RRR  Pulmonary/Chest: Effort normal and breath sounds normal. No respiratory distress. She has no wheezes.  Clear and equal breath sounds  Abdominal: Soft. Bowel sounds  are normal. She exhibits no distension and no mass. There is no tenderness. There is no rebound and no guarding.  abd obese, but soft and nontender  Musculoskeletal: Normal range of motion. She exhibits no edema.  Lymphadenopathy:    She has no cervical adenopathy.  Neurological: She is alert and oriented to person, place, and time. She exhibits normal muscle tone. Coordination normal.  Speech is clear and goal oriented Moves extremities without ataxia  Skin: Skin is warm and dry. She is not diaphoretic. No erythema.  Psychiatric:  Pt uncooperative    ED Course  Procedures (including critical care time) Labs Review Labs Reviewed  CBC WITH DIFFERENTIAL - Abnormal; Notable for the following:    Hemoglobin 11.6 (*)    HCT 35.9 (*)    All other components within normal limits  COMPREHENSIVE METABOLIC PANEL - Abnormal; Notable for the following:    Glucose, Bld 118 (*)    Alkaline Phosphatase 141 (*)    Total Bilirubin <0.2 (*)    GFR calc non Af Amer 86 (*)    All other components within normal limits  URINALYSIS, ROUTINE W REFLEX MICROSCOPIC - Abnormal; Notable for the following:    APPearance CLOUDY (*)    Leukocytes, UA MODERATE (*)    All other components within normal limits  URINE MICROSCOPIC-ADD ON - Abnormal; Notable for the following:    Squamous Epithelial / LPF MANY (*)    Bacteria, UA FEW (*)    All other components within normal limits  URINE CULTURE  LIPASE, BLOOD  ETHANOL  URINE RAPID DRUG SCREEN (HOSP PERFORMED)  URINALYSIS, ROUTINE W REFLEX MICROSCOPIC  POC URINE PREG, ED    Imaging Review US Abdomen Complete  03/04/2014   CLINICAL DATA:  Abdominal pain.  EXAM: ULTRASOUND ABDOMEN COMPLETE  COMPARISON:  None.  FINDINGS: Gallbladder:  No gallstones or wall thickening visualized. No sonographic Murphy sign noted.  Common bile duct:  Diameter: 2.9 mm.  Liver:  Mild fatty infiltration without focal mass.  IVC:  No abnormality visualized.  Pancreas:  Visualized  portion unremarkable.  Spleen:  Size and appearance within normal limits.  Right Kidney:  Length: 9.1 cm. Echogenicity within normal limits. No mass or hydronephrosis visualized.  Left Kidney:  Length: 10.1 cm. Echogenicity within normal limits. No mass or hydronephrosis visualized.  Abdominal aorta:  No aneurysm visualized.  Other findings:  None.  IMPRESSION: No acute hepatobiliary findings.  Mild hepatic steatosis.   Electronically Signed   By: Marin Olp M.D.   On: 03/04/2014 17:56     EKG Interpretation None      MDM   Final diagnoses:  Nausea  Abdominal pain   [name] presents via EMS for abd pain.  Pt is uncooperative stating that she smelled the cheese and it made her sick.  Will obtain labs, abd [name], give nausea medication and reassess.    5:45PM Pt continues to rest comfortably, but continues to complain of nausea.  She has had no emesis in the department and is tolerating PO fluids without difficulty at this time.  UA concerning for UTI, but this is a dirty sample.  Discussed with pt my concerns and she agrees to in and out cath for continued  evaluation.    6:44 PM Pt alert and oriented, refusing in and out catheter.  Pt will give clean catch and we will repeat UA.    7:06 PM Repeat UA without evidence of UTI.  Pt continues to deny dysuria, hematuria, vaginal discharge or pelvic pain.  She is afebrile, non-tachycardic and well appearing.  Discussed all lab results findings including the initial questionable urinalysis. Recommend she followup with her primary care physician within 3 days. She is to return to the emergency department for intractable vomiting, high fevers or other concerning symptoms.  It has been determined that no acute conditions requiring further emergency intervention are present at this time. The patient/guardian have been advised of the diagnosis and plan. We have discussed signs and symptoms that warrant return to the ED, such as changes or worsening  in symptoms.   Vital signs are stable at discharge.   BP 132/66  Pulse 81  Temp(Src) 97.5 F (36.4 C) (Oral)  Resp 18  SpO2 96%  Patient/guardian has voiced understanding and agreed to follow-up with the PCP or specialist.   The patient was discussed with and seen by Dr. Winfred Leeds who agrees with the treatment plan.    Abigail Butts, PA-C 03/04/14 1921

## 2014-03-04 NOTE — Discharge Instructions (Signed)
1. Medications: phenergan, usual home medications 2. Treatment: rest, drink plenty of fluids,  3. Follow Up: Please followup with your primary doctor in 3 days for discussion of your diagnoses and further evaluation after today's visit; if you do not have a primary care doctor use the resource guide provided to find one;   Nausea, Adult Nausea is the feeling that you have an upset stomach or have to vomit. Nausea by itself is not likely a serious concern, but it may be an early sign of more serious medical problems. As nausea gets worse, it can lead to vomiting. If vomiting develops, there is the risk of dehydration.  CAUSES   Viral infections.  Food poisoning.  Medicines.  Pregnancy.  Motion sickness.  Migraine headaches.  Emotional distress.  Severe pain from any source.  Alcohol intoxication. HOME CARE INSTRUCTIONS  Get plenty of rest.  Ask your caregiver about specific rehydration instructions.  Eat small amounts of food and sip liquids more often.  Take all medicines as told by your caregiver. SEEK MEDICAL CARE IF:  You have not improved after 2 days, or you get worse.  You have a headache. SEEK IMMEDIATE MEDICAL CARE IF:   You have a fever.  You faint.  You keep vomiting or have blood in your vomit.  You are extremely weak or dehydrated.  You have dark or bloody stools.  You have severe chest or abdominal pain. MAKE SURE YOU:  Understand these instructions.  Will watch your condition.  Will get help right away if you are not doing well or get worse. Document Released: 11/20/2004 Document Revised: 07/07/2012 Document Reviewed: 06/25/2011 [facility] Patient Information 2014 [place], [place].   Emergency Department Resource Guide 1) Find a Doctor and Pay Out of Pocket Although you won't have to find out who is covered by your insurance plan, it is a good idea to ask around and get recommendations. You will then need to call the office and see if the  doctor you have chosen will accept you as a new patient and what types of options they offer for patients who are self-pay. Some doctors offer discounts or will set up payment plans for their patients who do not have insurance, but you will need to ask so you aren't surprised when you get to your appointment.  2) Contact Your Local Health Department Not all health departments have doctors that can see patients for sick visits, but many do, so it is worth a call to see if yours does. If you don't know where your local health department is, you can check in your phone book. The CDC also has a tool to help you locate your state's health department, and many state websites also have listings of all of their local health departments.  3) Find a [facility] If your illness is not likely to be very severe or complicated, you may want to try a walk in clinic. These are popping up all over the country in pharmacies, drugstores, and shopping centers. They're usually staffed by nurse practitioners or physician assistants that have been trained to treat common illnesses and complaints. They're usually fairly quick and inexpensive. However, if you have serious medical issues or chronic medical problems, these are probably not your best option.  No Primary Care Doctor: - Call Health Connect at  [phone] - they can help you locate a primary care doctor that  accepts your insurance, provides certain services, etc. - Physician Referral Service- [phone]  Chronic Pain Problems:  Organization         Address  Phone   Notes  [facility]  [phone] Patients need to be referred by their primary care doctor.   Medication Assistance: Organization         Address  Phone   Notes  [facility] Medication [facility] [address] [place], [place] [address] [phone] --Must be a resident of [facility] -- Must have NO insurance coverage whatsoever (no Medicaid/  Medicare, etc.) -- The pt. MUST have a primary care doctor that directs their care regularly and follows them in the community   Med[facility]ist  [phone]   [employer]  [phone]    Agencies that provide inexpensive medical care: Organization         Address  Phone   Notes  [place]  [phone]   [name]    [phone]   [facility] [place], [place] [address] [phone]   [place] [address], [place] [phone]   Planned Parenthood    [phone]   [facility]    [phone]   [place] and [address], [place] Phone:  [phone], Fax:  [phone] Hours of Operation:  9 am - 6 pm, M-F.  Also accepts Medicaid/Medicare and self-pay.  [facility] for [place], [address], [place] Phone: [phone], Fax: [phone]. Hours of Operation:  8:30 am - 5:30 pm, M-F.  Also accepts Medicaid and self-pay.  [facility] [place] [address] [place] Phone: [phone]   [place], [place], [place] [phone], Ext. [phone] Mondays & Thursdays: 7-9 AM.  First 15 patients are seen on a first come, first serve basis.    [place] Providers:  Organization         Address  Phone   Notes  [facility] [address], [place] [phone] Also accepts self-pay patients.  [facility] [address], [place]  [phone]   [place], [address], [place] [phone]   [facility] [address] [place] [phone]   Lucianne Lei [address], [place]   [phone] Only accepts [place] Access [place] patients after they have their name applied to their card.    Self-Pay (no insurance) in [facility]:  Organization         Address  Phone   Notes  Sickle Cell Patients, [facility] [place] [phone]   [facility] Urgent Care [place] [phone]   [name] [place]  [place], [place],  [phone]   [facility]/Dr. Osei-Bonsu  [address], [place] or [address], [place] [phone] Phone number for both [place] and [place] locations is the same.  Urgent Medical and [facility] [address] [place] [phone]   [facility] [address], [place] or [address] [phone]   [facility] [address] [place] [phone], phone; [phone], fax Sees patients 1st and  3rd Saturday of every month.  Must not qualify for public or private insurance (i.e. Medicaid, Medicare, [place] Health Choice, Veterans' Benefits)  Household income should be no more than 200% of the poverty level The clinic cannot treat you if you are pregnant or think you are pregnant  Sexually transmitted diseases are not treated at the clinic.    Dental Care: Organization         Address  Phone  Notes  [facility] Department of [facility] [phone] Accepts children up to age 64 who are enrolled in [place] or [place]; pregnant women with a Medicaid card; and children who have applied for Medicaid or [place] Health Choice, but were declined, whose parents can pay a reduced fee at time of service.  [facility] Department of [facility]  [address], [place] [phone] Accepts children up to age 60 who are enrolled in [place] or [place]; pregnant women with a Medicaid card; and children who have applied for Medicaid or [place] Health Choice,  but were declined, whose parents can pay a reduced fee at time of service.  [place] Adult [facility] Access PROGRAM  [place] [phone] Patients are seen by appointment only. Walk-ins are not accepted. [place] will see patients 35 years of age and older. Monday - Tuesday (8am-5pm) Most Wednesdays (8:30-5pm) $30 per visit, cash only  [facility] Adult Dental Access PROGRAM  [address], [facility] [phone] Patients are seen by appointment only. Walk-ins are not accepted. [place] will see patients 69 years of age and older. One Wednesday Evening (Monthly: Volunteer Based).  $30 per visit, cash only  [place]  [phone] for adults; Children under age 23, call Graduate Pediatric Dentistry at [phone]. Children aged 56-14, please call [phone] to request a pediatric application.  Dental services are provided in all areas of dental care including fillings, crowns and bridges, complete and partial dentures, implants, gum treatment, root canals, and extractions. Preventive care is also provided. Treatment is provided to both adults and children. Patients are selected via a lottery and there is often a waiting list.   [facility] [place] [facility] [address] [place]  [phone] [email or web address]   [facility] [address] [place], [place] [phone], Ext. [phone] Second and Fourth Thursday of each month, opens at 6:30 AM; Clinic ends at 9 AM.  Patients are seen on a first-come first-served basis, and a limited number are seen during each clinic.   [facility]  [address] Hillard Danker [place], [place] [phone]   Eligibility Requirements You must have lived in [place], [place], or [place] counties for at least the last three months.   You cannot be eligible for state or federal sponsored [employer], including [employer], [place], or [employer].   You generally  cannot be eligible for healthcare insurance through your employer.    How to apply: Eligibility screenings are held every Tuesday and Wednesday afternoon from 1:00 pm until 4:00 pm. You do not need an appointment for the interview!  [facility] [address], [place], [place]   [place]  [place] Department  [place]  [phone]    Behavioral Health Resources in the Community: Intensive Outpatient Programs Organization  Address  Phone  Notes  [place] [address], [place], [place] [phone]   [facility] [address], [place], [place]   ADS: Alcohol [facility] Drug Svcs [address] [place], [place]   [place] [address]  [place], [place] or [phone]   Substance Abuse Resources Organization         Address  Phone  Notes  Alcohol and Drug Services  [phone]   [place]  [phone]   [facility] [place]   Chinita Pester  [phone]   Residential & Outpatient Substance Abuse Program  [phone]   Psychological Services Organization         Address  Phone  Notes  [facility] [place]  [place]  [phone]   [place] [address] [place] or [phone]    Mobile Crisis Teams Organization         Address  Phone  Notes  [facility], Mobile Crisis Care Unit  [phone]   [facility]  [address] [place], [place]   [place] [address], [place] [phone]    Self-Help/Support Groups Organization         Address  Phone             Notes  [place]. of [place] - variety of support groups  [place] Call for more  information  Narcotics Anonymous (NA), [facility]aring [facility] [address], [employer] [place]  2 meetings at this location   Special educational needs teacher         Address  Phone  Notes  [facility] Residential Treatment [place],    [place]  [phone]   [facility]  [address], [place] [address], [place], [place]   [place], [place] [phone] Admissions: 8am-3pm M-F  Incentives Substance [place] [address],    [place], [place] [phone]   [facility] [address] [place], [place], [place]   [facility] [address]  [place], [place]   [facility] Programs - Intensive Outpatient [address], [place], [place]   [facility] ([place] [address]  [place], [place] [phone] or [phone]   [facility] Services (RTS) [address], [place], [place] Accepts Medicaid  [facility] [place] [address]  [place] [phone] Substance Abuse/Addiction Treatment   [facility] Organization         Address  Phone  Notes  [facility]  [phone]   Domenic Schwab, PhD [address] Arlis Porta [place], [place]   [phone] or [phone]   Michigamme Ames [place], [place] [phone]   [place] [address], [place], [place] [phone] Insurance/Medicaid/sponsorship through [name] and Families [address] [place]                                    [place], [place] [phone] [place] [address], [place] [phone]    Dr. Adele Schilder  [phone]   [facility]  [place] Dept. 1) [address], [place] 2) [place] 3)  [address], [place] [phone]  [phone]   [place] [phone] or [phone] (After Hours)

## 2014-03-04 NOTE — ED Notes (Signed)
Pt presented by EMS, report of being found outside a restaurant, bystanders were not sure if she fell, pt c/o of abdominal pain and nausea. Pt not offering information as to the events leading up to her being brought in by EMS, didn't offer any information to EMS as well.

## 2014-03-04 NOTE — ED Notes (Signed)
Patient given bus pass at discharge.  

## 2014-03-04 NOTE — ED Notes (Signed)
Bed: AN19 Expected date:  Expected time:  Means of arrival:  Comments: EMS abd. Pain/uncooperative

## 2014-03-04 NOTE — ED Notes (Signed)
Pt refused to get an in and out cath, provided a clean catch sample

## 2014-03-04 NOTE — ED Provider Notes (Signed)
Patient brought by EMS patient developed right upper quadrant pain with nausea after eating a cheeseburger and drinking a margarita this afternoon. On exam appears uncomfortable. Lungs clear auscultation abdomen morbidly obese positive bowel sounds tender at right upper quadrant  Orlie Dakin, MD 03/04/14 6314

## 2014-03-06 LAB — URINE CULTURE: Colony Count: 50000

## 2015-03-12 ENCOUNTER — Encounter (HOSPITAL_COMMUNITY): Payer: Self-pay | Admitting: *Deleted

## 2015-03-12 ENCOUNTER — Emergency Department (HOSPITAL_COMMUNITY)
Admission: EM | Admit: 2015-03-12 | Discharge: 2015-03-16 | Disposition: A | Discharge status: Other Acute Inpatient Hospital | Payer: Self-pay

## 2015-03-12 ENCOUNTER — Ambulatory Visit (HOSPITAL_COMMUNITY)
Admission: AD | Admit: 2015-03-12 | Discharge: 2015-03-12 | Disposition: A | Discharge status: Home / Self Care | Payer: Federal, State, Local not specified - Other | Attending: Psychiatry | Admitting: Psychiatry

## 2015-03-12 DIAGNOSIS — Z3202 Encounter for pregnancy test, result negative: Secondary | ICD-10-CM | POA: Insufficient documentation

## 2015-03-12 DIAGNOSIS — F22 Delusional disorders: Secondary | ICD-10-CM | POA: Insufficient documentation

## 2015-03-12 DIAGNOSIS — F332 Major depressive disorder, recurrent severe without psychotic features: Secondary | ICD-10-CM | POA: Diagnosis not present

## 2015-03-12 DIAGNOSIS — F1310 Sedative, hypnotic or anxiolytic abuse, uncomplicated: Secondary | ICD-10-CM | POA: Insufficient documentation

## 2015-03-12 DIAGNOSIS — F333 Major depressive disorder, recurrent, severe with psychotic symptoms: Secondary | ICD-10-CM | POA: Insufficient documentation

## 2015-03-12 DIAGNOSIS — R45851 Suicidal ideations: Secondary | ICD-10-CM | POA: Diagnosis not present

## 2015-03-12 DIAGNOSIS — F419 Anxiety disorder, unspecified: Secondary | ICD-10-CM | POA: Insufficient documentation

## 2015-03-12 DIAGNOSIS — E119 Type 2 diabetes mellitus without complications: Secondary | ICD-10-CM | POA: Insufficient documentation

## 2015-03-12 LAB — COMPREHENSIVE METABOLIC PANEL
ALBUMIN: 4.6 g/dL (ref 3.5–5.0)
ALT: 23 U/L (ref 14–54)
AST: 21 U/L (ref 15–41)
Alkaline Phosphatase: 134 U/L — ABNORMAL HIGH (ref 38–126)
Anion gap: 12 (ref 5–15)
BUN: 17 mg/dL (ref 6–20)
CHLORIDE: 105 mmol/L (ref 101–111)
CO2: 22 mmol/L (ref 22–32)
Calcium: 9.3 mg/dL (ref 8.9–10.3)
Creatinine, Ser: 0.98 mg/dL (ref 0.44–1.00)
GFR calc Af Amer: >60 mL/min (ref >60)
GLUCOSE: 117 mg/dL — AB (ref 65–99)
POTASSIUM: 3.9 mmol/L (ref 3.5–5.1)
Sodium: 139 mmol/L (ref 135–145)
Total Bilirubin: 0.6 mg/dL (ref 0.3–1.2)
Total Protein: 8.3 g/dL — ABNORMAL HIGH (ref 6.5–8.1)

## 2015-03-12 LAB — CBC WITH DIFFERENTIAL/PLATELET
BASOS ABS: 0 10*3/uL (ref 0.0–0.1)
BASOS PCT: 0 % (ref 0–1)
Eosinophils Absolute: 0.2 10*3/uL (ref 0.0–0.7)
Eosinophils Relative: 3 % (ref 0–5)
HCT: 38.9 % (ref 36.0–46.0)
Hemoglobin: 12.2 g/dL (ref 12.0–15.0)
LYMPHS PCT: 36 % (ref 12–46)
Lymphs Abs: 1.9 10*3/uL (ref 0.7–4.0)
MCH: 25.5 pg — ABNORMAL LOW (ref 26.0–34.0)
MCHC: 31.4 g/dL (ref 30.0–36.0)
MCV: 81.4 fL (ref 78.0–100.0)
MONO ABS: 0.2 10*3/uL (ref 0.1–1.0)
MONOS PCT: 4 % (ref 3–12)
NEUTROS ABS: 2.9 10*3/uL (ref 1.7–7.7)
Neutrophils Relative %: 57 % (ref 43–77)
Platelets: 321 10*3/uL (ref 150–400)
RBC: 4.78 MIL/uL (ref 3.87–5.11)
RDW: 14.1 % (ref 11.5–15.5)
WBC: 5.2 10*3/uL (ref 4.0–10.5)

## 2015-03-12 LAB — ETHANOL: Alcohol, Ethyl (B): <5 mg/dL (ref <5)

## 2015-03-12 MED ORDER — ONDANSETRON HCL 4 MG PO TABS: 4.0000 mg | ORAL_TABLET | Freq: Three times a day (TID) | ORAL | Status: DC | PRN

## 2015-03-12 MED ORDER — ACETAMINOPHEN 325 MG PO TABS: 650.0000 mg | ORAL_TABLET | ORAL | Status: DC | PRN

## 2015-03-12 MED ORDER — LORAZEPAM 2 MG/ML IJ SOLN
1.0000 mg | Freq: Once | INTRAMUSCULAR | Status: AC
  Administered 2015-03-12: 1 mg via INTRAMUSCULAR
  Filled 2015-03-12: qty 1

## 2015-03-12 MED ORDER — IBUPROFEN 200 MG PO TABS: 600.0000 mg | ORAL_TABLET | Freq: Three times a day (TID) | ORAL | Status: DC | PRN

## 2015-03-12 MED ORDER — ZIPRASIDONE MESYLATE 20 MG IM SOLR
20.0000 mg | Freq: Once | INTRAMUSCULAR | Status: AC
  Administered 2015-03-12 – 2015-03-13 (×2): 20 mg via INTRAMUSCULAR
  Filled 2015-03-12: qty 20

## 2015-03-12 MED ORDER — ALUM & MAG HYDROXIDE-SIMETH 200-200-20 MG/5ML PO SUSP: 30.0000 mL | ORAL | Status: DC | PRN

## 2015-03-12 NOTE — ED Notes (Signed)
Attempted to get vital signs on pt, when asked pt if I could get vital signs she stated yes, when took machine in room she then stated "I'm no in the mood", and rolled back over, pt states she cannot give urine sample at this time either. Sitter at bedside.

## 2015-03-12 NOTE — ED Notes (Signed)
Patient refused vs. Encouraged food/fluids.

## 2015-03-12 NOTE — BHH Counselor (Addendum)
Received a call from [facility] that patient arrived for medical clearance and is being IVC'd. Patient will not be transported back to [facility] per [place], [place], [facility].

## 2015-03-12 NOTE — ED Provider Notes (Signed)
CSN: [id number]     Arrival date & time 03/12/15  0701 History   First MD Initiated Contact with Patient 03/12/15 [phone]     Chief Complaint  Patient presents with  . IVC   . Suicidal     (Consider location/radiation/quality/duration/timing/severity/associated sxs/prior Treatment) HPI Comments: Patient reports she has been feeling very low and feels like she hit rock bottom.  She is currently homeless and has been living out of her car, however.  The car was said to be repossessed today.  She states that since 2008.  She has been monitored by the government using audiovisual equipment and that they have ruined her life.  She states that she is college-educated however, is lost her job.  She has filed for unemployment [place] to run out of money and will not receive unemployment for several weeks.  Today she began having thoughts of self-harm and drove herself to the behavioral health Hospital.  She was sent from behavioral [facility] Hospital to the ED for medical clearance.  She had become upset earlier with [employer], however, my exam.  She is calm, though tearful.  She will ports thoughts of suicide, though no definitive plan.  She also denies homicidal ideation.   Past Medical History  Diagnosis Date  . Anxiety   . Major depressive disorder, recurrent, severe with psychotic features   . Diabetes mellitus without complication    History reviewed. No pertinent past surgical history. Family History  Problem Relation Age of Onset  . Hypertension Other   . Diabetes Other    History  Substance Use Topics  . Smoking status: Never Smoker   . Smokeless tobacco: Never Used  . Alcohol Use: Yes     Comment: socially   OB History    No data available     Review of Systems  Constitutional: Negative for fever, chills, diaphoresis, activity change, appetite change and fatigue.  HENT: Negative for congestion, facial swelling, rhinorrhea and sore throat.   Eyes: Negative for photophobia and discharge.   Respiratory: Negative for cough, chest tightness and shortness of breath.   Cardiovascular: Negative for chest pain, palpitations and leg swelling.  Gastrointestinal: Negative for nausea, vomiting, abdominal pain and diarrhea.  Endocrine: Negative for polydipsia and polyuria.  Genitourinary: Negative for dysuria, frequency, difficulty urinating and pelvic pain.  Musculoskeletal: Negative for back pain, arthralgias, neck pain and neck stiffness.  Skin: Negative for color change and wound.  Allergic/Immunologic: Negative for immunocompromised state.  Neurological: Negative for facial asymmetry, weakness, numbness and headaches.  Hematological: Does not bruise/bleed easily.  Psychiatric/Behavioral: Positive for suicidal ideas. Negative for confusion, self-injury and agitation.      Allergies  Review of patient's allergies indicates no known allergies.  Home Medications   Prior to Admission medications   Medication Sig Start Date End Date Taking? Authorizing Provider  promethazine (PHENERGAN) 25 MG tablet Take 1 tablet (25 mg total) by mouth every 6 (six) hours as needed for nausea or vomiting. Patient not taking: Reported on 03/12/2015 03/04/14   Jarrett Soho Muthersbaugh, PA-C   BP 97/52 mmHg  Pulse 55  Temp(Src) 97.5 F (36.4 C) (Oral)  Resp 16  SpO2 99% Physical Exam  Constitutional: She is oriented to person, place, and time. She appears well-developed and well-nourished. No distress.  HENT:  Head: Normocephalic and atraumatic.  Mouth/Throat: No oropharyngeal exudate.  Eyes: Pupils are equal, round, and reactive to light.  Neck: Normal range of motion. Neck supple.  Cardiovascular: Normal rate, regular rhythm and normal  heart sounds.  Exam reveals no gallop and no friction rub.   No murmur heard. Pulmonary/Chest: Effort normal and breath sounds normal. No respiratory distress. She has no wheezes. She has no rales.  Abdominal: Soft. Bowel sounds are normal. She exhibits no  distension and no mass. There is no tenderness. There is no rebound and no guarding.  Musculoskeletal: Normal range of motion. She exhibits no edema or tenderness.  Neurological: She is alert and oriented to person, place, and time.  Skin: Skin is warm and dry.  Psychiatric: Thought content is delusional. She exhibits a depressed mood. She expresses suicidal ideation.  Patient believes that the government survey lying her constantly using audio visual equipment    ED Course  Procedures (including critical care time) Labs Review Labs Reviewed  COMPREHENSIVE METABOLIC PANEL - Abnormal; Notable for the following:    Glucose, Bld 117 (*)    Total Protein 8.3 (*)    Alkaline Phosphatase 134 (*)    All other components within normal limits  CBC WITH DIFFERENTIAL/PLATELET - Abnormal; Notable for the following:    MCH 25.5 (*)    All other components within normal limits  ETHANOL  CBC WITH DIFFERENTIAL/PLATELET  URINE RAPID DRUG SCREEN (HOSP PERFORMED)  URINALYSIS, ROUTINE W REFLEX MICROSCOPIC    Imaging Review No results found.   EKG Interpretation   Date/Time:  Monday Mar 12 2015 07:48:46 EDT Ventricular Rate:  59 PR Interval:  159 QRS Duration: 64 QT Interval:  420 QTC Calculation: 416 R Axis:   41 Text Interpretation:  Sinus rhythm Low voltage, precordial leads No change  vs 04-07-2012 Confirmed by Jeneen Rinks  MD, [place] ([id number]) on 03/12/2015 7:54:16 AM      MDM   Final diagnoses:  Delusion of persecution  Suicidal ideation    Pt is a 49 y.o. female with Pmhx as above who presents with feelings of hopelessness and also persecution by the government as well as SI without clear plan.  Patient denies HI.  She does not appear to be having active hallucinations.  She checked herself into behavioral health Hospital this morning and was sent for medical clearance.  Upon learning that she would be transported by [employer].  She became agitated, however, my exam.  She is calm,  cooperative, though tearful.  Plan for medical clearance in TTS eval.  During exam.  She tells me that she would maybe hurt herself.  She left and then told nursing that she was thinking about leaving.  Therefore IVC has been filed.    [name], MD 03/13/15 [phone]

## 2015-03-12 NOTE — BH Assessment (Signed)
Assessment completed. Consulted Arlester Marker, NP who agrees that pt meets inpatient criteria. Pt will be sent to [facility] for medical clearance.

## 2015-03-12 NOTE — ED Notes (Signed)
Pt told AC and NT that she didn't like the "nurse in the [place]", pt then came out to nurses station and asked if the nurses name was Carilyn Goodpasture, the pt then started to throw bible, pt charts, papers and whatever else she could get to at nurse, also yelling at nurse and cussing, Security and [employer] called, to give pt medication.

## 2015-03-12 NOTE — ED Notes (Signed)
Pt held down by security, [employer] and staff to give shots, before that pt started yelling and hitting/kicking.

## 2015-03-12 NOTE — ED Notes (Signed)
Pt brought over from [facility] via [employer]; pt voluntarily went to [facility] this am because she was feeling low; pt states that she might cause harm to herself but has specific plan; pt states that she is homeless and has been living in her car; pt states that she has a rental and she has to turn that in today; pt also states that the car that she is has is to be repossessed; pt states that she realized she was in such a low place in her life and when she was asked to come here with pelham she began yelling and screaming and was bought to the ER via [employer]
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
## 2015-03-12 NOTE — ED Notes (Signed)
Urine hat placed in toliet for next time pt urinates.
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
## 2015-03-12 NOTE — ED Notes (Signed)
Pt in room agitated, spitting in floor, stating "I want to go home".
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
## 2015-03-13 DIAGNOSIS — R45851 Suicidal ideations: Secondary | ICD-10-CM

## 2015-03-13 DIAGNOSIS — F332 Major depressive disorder, recurrent severe without psychotic features: Secondary | ICD-10-CM

## 2015-03-13 MED ORDER — DIPHENHYDRAMINE HCL 50 MG/ML IJ SOLN
INTRAMUSCULAR | Status: AC
  Administered 2015-03-13: 12:00:00
  Filled 2015-03-13: qty 1

## 2015-03-13 MED ORDER — LORAZEPAM 2 MG/ML IJ SOLN
INTRAMUSCULAR | Status: AC
  Administered 2015-03-13: 2 mg via INTRAMUSCULAR
  Filled 2015-03-13: qty 1

## 2015-03-13 MED ORDER — ZIPRASIDONE MESYLATE 20 MG IM SOLR
INTRAMUSCULAR | Status: AC
  Administered 2015-03-13: 20 mg via INTRAMUSCULAR
  Filled 2015-03-13: qty 20

## 2015-03-13 NOTE — ED Notes (Signed)
Pt talking to self in anger and kicking around in bed, sitter at bedside speaking w/ pt at this time.

## 2015-03-13 NOTE — ED Notes (Signed)
Patient refusing to communicate with this Probation officer. Declined food/fluids.  Explained POC and morning rounds be providers.  Support offered and sitter at bedside.

## 2015-03-13 NOTE — ED Notes (Signed)
Pt crying loudly, redirected and given ginger ale and crackers by sitter.

## 2015-03-13 NOTE — ED Notes (Signed)
Patient was observed responding to internal stimuli. She spat at staff and began throwing items in her room.  This Probation officer attempted to verbally deescalate patient.  She became verbally aggressive and was unwilling to calm herself. Patient escalated to screaming and thrashing,becoming unsafefor her own safety.  [employer]/security to bedside and provider informed.  PRN IM medications administered as ordered. Will continue to monitor and assess.

## 2015-03-13 NOTE — BH Assessment (Signed)
[facility] Assessment Progress Note  The following facilities have been contacted to seek placement for this patient with results as noted:  Beds available, information sent, decision pending:  [facility]  Previously declined:  [facility]  At capacity:  Ginny Forth, [place] Triage Specialist [phone]

## 2015-03-13 NOTE — ED Notes (Signed)
Patient in NAD. Breathing is steady, easy and unlabored. Snoring.

## 2015-03-13 NOTE — ED Notes (Signed)
Sitter within sight.

## 2015-03-13 NOTE — ED Notes (Signed)
Pt up ambulating in hallway, will not talk or answer staff when spoken to.

## 2015-03-13 NOTE — ED Notes (Signed)
Unable to assess nutritional status for boarder.  Deferred until pt willing to communicate.

## 2015-03-14 LAB — URINE MICROSCOPIC-ADD ON

## 2015-03-14 LAB — URINALYSIS, ROUTINE W REFLEX MICROSCOPIC
Bilirubin Urine: NEGATIVE
GLUCOSE, UA: NEGATIVE mg/dL
Hgb urine dipstick: NEGATIVE
Ketones, ur: NEGATIVE mg/dL
Nitrite: NEGATIVE
Protein, ur: NEGATIVE mg/dL
Specific Gravity, Urine: 1.02 (ref 1.005–1.030)
UROBILINOGEN UA: 0.2 mg/dL (ref 0.0–1.0)
pH: 5.5 (ref 5.0–8.0)

## 2015-03-14 LAB — PREGNANCY, URINE: PREG TEST UR: NEGATIVE

## 2015-03-14 LAB — RAPID URINE DRUG SCREEN, HOSP PERFORMED
AMPHETAMINES: NOT DETECTED
Barbiturates: NOT DETECTED
Benzodiazepines: POSITIVE — AB
Cocaine: NOT DETECTED
OPIATES: NOT DETECTED
Tetrahydrocannabinol: NOT DETECTED

## 2015-03-14 LAB — CBG MONITORING, ED: Glucose-Capillary: 125 mg/dL — ABNORMAL HIGH (ref 65–99)

## 2015-03-14 MED ORDER — LORAZEPAM 2 MG/ML IJ SOLN
2.0000 mg | Freq: Once | INTRAMUSCULAR | Status: AC
  Administered 2015-03-14: 2 mg via INTRAMUSCULAR
  Filled 2015-03-14: qty 1

## 2015-03-14 MED ORDER — DIPHENHYDRAMINE HCL 50 MG/ML IJ SOLN
50.0000 mg | Freq: Once | INTRAMUSCULAR | Status: AC
  Administered 2015-03-14: 50 mg via INTRAMUSCULAR
  Filled 2015-03-14: qty 1

## 2015-03-14 MED ORDER — OLANZAPINE 10 MG PO TBDP
10.0000 mg | ORAL_TABLET | Freq: Three times a day (TID) | ORAL | Status: DC | PRN
  Administered 2015-03-15 – 2015-03-16 (×2): 10 mg via ORAL
  Filled 2015-03-14 (×2): qty 1

## 2015-03-14 MED ORDER — TRAZODONE HCL 100 MG PO TABS: 100.0000 mg | ORAL_TABLET | Freq: Every evening | ORAL | Status: DC | PRN

## 2015-03-14 MED ORDER — OLANZAPINE 10 MG PO TBDP: 10.0000 mg | ORAL_TABLET | Freq: Two times a day (BID) | ORAL | Status: DC

## 2015-03-14 MED ORDER — CARBAMAZEPINE 200 MG PO TABS
200.0000 mg | ORAL_TABLET | Freq: Two times a day (BID) | ORAL | Status: DC
  Administered 2015-03-15 – 2015-03-16 (×2): 200 mg via ORAL
  Filled 2015-03-14 (×7): qty 1

## 2015-03-14 MED ORDER — ZIPRASIDONE MESYLATE 20 MG IM SOLR
20.0000 mg | Freq: Once | INTRAMUSCULAR | Status: AC
  Administered 2015-03-14: 20 mg via INTRAMUSCULAR
  Filled 2015-03-14: qty 20

## 2015-03-14 NOTE — ED Notes (Signed)
Pt would not listen to directives to stay in room nor would the patient stop cursing loud and being belligerent to staff.   Pt was asked several times to calm down; however, she was demanding staff members to answer to her as if she has two master's degree.

## 2015-03-14 NOTE — ED Notes (Signed)
Patient will not allow medical staff to take VS, will f/u

## 2015-03-14 NOTE — ED Notes (Signed)
Pt sitting up in chair, talking to herself or maybe someone else, acting like she is beating someone w/ the tv remote.

## 2015-03-14 NOTE — ED Notes (Signed)
Patient intially refused food (lunch tray). It was placed on the desk.Patient in hallway eating tray from desk.

## 2015-03-14 NOTE — Consult Note (Signed)
[facility] Face-to-Face Psychiatry Consult   Reason for Consult:  Suicidal threats Referring Physician:  EDP Patient Identification: [name] MRN:  [id number] Principal Diagnosis: Severe recurrent major depression without psychotic features Diagnosis:   Patient Active Problem List   Diagnosis Date Noted  . Severe recurrent major depression without psychotic features [F33.2] 03/12/2015    Priority: High  . Suicidal ideations [R45.851] 03/12/2015    Priority: High  . Diabetes [E11.9] 09/06/2012  . Delusional disorder(297.1) [F22] 04/16/2012    Total Time spent with patient: 30 minutes  Subjective:   [name] is a 49 y.o. female patient admitted with psychosis.  HPI:  The patient presented to the ED with depression and suicide threats.  While in the ED, she has been talking to people who are not there,yelling at times, and responding to internal stimuli.  She is a poor historian as she will forward very little but clearly unstable and safety risk. HPI Elements:   Location:  generalized. Quality:  acute. Severity:  severe. Timing:  constants. Duration:  few days. Context:  stressors.  Past Medical History:  Past Medical History  Diagnosis Date  . Anxiety   . Major depressive disorder, recurrent, severe with psychotic features   . Diabetes mellitus without complication    History reviewed. No pertinent past surgical history. Family History:  Family History  Problem Relation Age of Onset  . Hypertension Other   . Diabetes Other    Social History:  History  Alcohol Use  . Yes    Comment: socially     History  Drug Use No    History   Social History  . Marital Status: Single    Spouse Name: N/A  . Number of Children: N/A  . Years of Education: N/A   Social History Main Topics  . Smoking status: Never Smoker   . Smokeless tobacco: Never Used  . Alcohol Use: Yes     Comment: socially  . Drug Use: No  . Sexual Activity: Not on file   Other Topics Concern   . None   Social History Narrative   Additional Social History:                          Allergies:  No Known Allergies  Labs:  Results for orders placed or performed during the hospital encounter of 03/12/15 (from the past 48 hour(s))  CBG monitoring, ED     Status: Abnormal   Collection Time: 03/14/15 11:00 AM  Result Value Ref Range   Glucose-Capillary 125 (H) 65 - 99 mg/dL    Vitals: Blood pressure 89/56, pulse 56, temperature 97.5 F (36.4 C), temperature source Oral, resp. rate 18, SpO2 95 %.  Risk to Self: Is patient at risk for suicide?: Yes Risk to Others:   Prior Inpatient Therapy:   Prior Outpatient Therapy:    Current Facility-Administered Medications  Medication Dose Route Frequency Provider Last Rate Last Dose  . acetaminophen (TYLENOL) tablet 650 mg  650 mg Oral Q4H PRN [name], MD      . alum & mag hydroxide-simeth (MAALOX/MYLANTA) 200-200-20 MG/5ML suspension 30 mL  30 mL Oral PRN [name], MD      . carbamazepine (TEGRETOL) tablet 200 mg  200 mg Oral BID PC Harlei Lehrmann      . ibuprofen (ADVIL,MOTRIN) tablet 600 mg  600 mg Oral Q8H PRN [name], MD      . OLANZapine zydis (ZYPREXA) disintegrating tablet 10  mg  10 mg Oral BID PC Nobie Alleyne      . OLANZapine zydis (ZYPREXA) disintegrating tablet 10 mg  10 mg Oral Q8H PRN Kalecia Hartney      . ondansetron (ZOFRAN) tablet 4 mg  4 mg Oral Q8H PRN [name], MD      . traZODone (DESYREL) tablet 100 mg  100 mg Oral QHS PRN Zalaya Astarita       Current Outpatient Prescriptions  Medication Sig Dispense Refill  . promethazine (PHENERGAN) 25 MG tablet Take 1 tablet (25 mg total) by mouth every 6 (six) hours as needed for nausea or vomiting. (Patient not taking: Reported on 03/12/2015) 5 tablet 0    Musculoskeletal: Strength & Muscle Tone: within normal limits Gait & Station: normal Patient leans: N/A  Psychiatric Specialty Exam: Physical Exam  Review of Systems   Constitutional: Negative.   HENT: Negative.   Eyes: Negative.   Respiratory: Negative.   Cardiovascular: Negative.   Gastrointestinal: Negative.   Genitourinary: Negative.   Musculoskeletal: Negative.   Skin: Negative.   Neurological: Negative.   Endo/Heme/Allergies: Negative.   Psychiatric/Behavioral: Positive for depression, suicidal ideas and hallucinations.    Blood pressure 89/56, pulse 56, temperature 97.5 F (36.4 C), temperature source Oral, resp. rate 18, SpO2 95 %.There is no weight on file to calculate BMI.  General Appearance: Disheveled  Eye Contact::  Minimal  Speech:  Normal Rate  Volume:  Normal  Mood:  Anxious, Depressed and Irritable  Affect:  Blunt  Thought Process:  Disorganized  Orientation:  Other:  person  Thought Content:  Hallucinations: Auditory Visual  Suicidal Thoughts:  Yes.  with intent/plan  Homicidal Thoughts:  No  Memory:  Immediate;   Fair Recent;   Poor Remote;   Poor  Judgement:  Impaired  Insight:  Fair  Psychomotor Activity:  Normal  Concentration:  Poor  Recall:  Poor  Fund of Knowledge:Fair  Language: Fair  Akathisia:  No  Handed:  Right  AIMS (if indicated):     Assets:  Leisure Time Physical Health Resilience  ADL's:  Intact  Cognition: WNL  Sleep:      Medical Decision Making: Review of Psycho-Social Stressors (1), Review or order clinical lab tests (1) and Review of Medication Regimen & Side Effects (2)  Treatment Plan Summary: Daily contact with patient to assess and evaluate symptoms and progress in treatment, Medication management and Plan admit to inpatient psychiatry for stabilization  Plan:  Recommend psychiatric Inpatient admission when medically cleared. Disposition: Johny Sax, PMH-NP 03/14/2015 3:11 PM Patient seen face-to-face for psychiatric evaluation, chart reviewed and case discussed with the physician extender and developed treatment plan. Reviewed the information documented and agree with  the treatment plan. Corena Pilgrim, MD

## 2015-03-14 NOTE — ED Notes (Addendum)
Pt began to raise her voice, speaking to herself with her eyes closed stating that "Russella Dar" has her under "government surveillance" and is calling her a "Nigger, Bitch, and Dumbass". Pt continued to have a verbal discussion containing expletives with "Russella Dar". RN requested pt to calm down and discontinue using expletives immediately.

## 2015-03-14 NOTE — ED Notes (Signed)
Pt sleeping comfortably.

## 2015-03-14 NOTE — Progress Notes (Signed)
49 yr old female homeless [place] pt dx severe recurrent major depression with psychoitic features PMH DM, Delusional Pt stated on 03/12/15 she was living in a car Pt noted by ED CM on 03/13/15 & 03/14/15 to speak with people she think are in her room (no one is in room), voices aggressively and to speak aggressively with anyone approaching her pt noted to throw items including fluids on the floor in her room and into the unit hallway on 03/13/15 As evidence per [facility] pt was given geodon and Ativan Later on 03/13/15 CM came through unit to note pt was sleeping 1035 03/14/15 Pt noted again to be speaking with people she think are in her room (no one is in room), voices aggressively and loudly Pt speaking about churches, pastors, taking someone's money and harming someone  Pt listed without insurance coverage nor a pcp locally This assessment of these needs will be postponed.

## 2015-03-14 NOTE — Consult Note (Signed)
[facility] Face-to-Face Psychiatry Consult   Reason for Consult:  Suicidal ideations Referring Physician:  EDP Patient Identification: [name] MRN:  [id number] Principal Diagnosis: Severe recurrent major depression without psychotic features Diagnosis:   Patient Active Problem List   Diagnosis Date Noted  . 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 Suicidal ideations [R45.851] 03/12/2015  . Diabetes [E11.9] 09/06/2012  . Delusional disorder(297.1) [F22] 04/16/2012    Total Time spent with patient: 20 minutes  Subjective:   [name] is a 49 y.o. female patient admitted with depression and suicide intent.  HPI:  The patient has been living in her car but it is to be reposessed today.  She has been in a bad relationship since 2008.  [name] has a court date on 5/17 (tomorrow) for intentionally scratching a car for a person she claims is harassing her.  Past history of depression and suicide attempts.  She would not talk to the psychiatrist of nurse practitioner on rounds.  Information obtained from her nurse when admitted to [facility]. HPI Elements:   Location:  generalized. Quality:  acute. Severity:  severe. Timing:  constnat. Duration:  few days. Context:  stressors.  03/13/15 [name] is seen face-to face today by this NP and Dr. Darleene Cleaver. She is laying in bed with her eyes closed. She makes eye contact with NP once after being prompted multiple times to open her eyes. She states she is in the hospital because "I got to my wits end." She reports having "money issues" and that she is "trying to resolve issues with police." She did not disclose what the issues were, only stating they "are personal." She reports having "1 or 2" suicidal thoughts earlier in the day; she not disclose a plan. She denies HI and AVH. Past Medical History:  Past Medical History  Diagnosis Date  . Anxiety   . Major depressive disorder, recurrent, severe with psychotic  features   . Diabetes mellitus without complication    History reviewed. No pertinent past surgical history. Family History:  Family History  Problem Relation Age of Onset  . Hypertension Other   . Diabetes Other    Social History:  History  Alcohol Use  . Yes    Comment: socially     History  Drug Use No    History   Social History  . Marital Status: Single    Spouse Name: N/A  . Number of Children: N/A  . Years of Education: N/A   Social History Main Topics  . Smoking status: Never Smoker   . Smokeless tobacco: Never Used  . Alcohol Use: Yes     Comment: socially  . Drug Use: No  . Sexual Activity: Not on file   Other Topics Concern  . None   Social History Narrative   Additional Social History:                          Allergies:  No Known Allergies  Labs:  Results for orders placed or performed during the hospital encounter of 03/12/15 (from the past 48 hour(s))  CBC with Differential/Platelet     Status: Abnormal   Collection Time: 03/12/15  8:11 AM  Result Value Ref Range   WBC 5.2 4.0 - 10.5 K/uL   RBC 4.78 3.87 - 5.11 MIL/uL   Hemoglobin 12.2 12.0 - 15.0 g/dL   HCT 38.9 36.0 - 46.0 %   MCV 81.4 78.0 - 100.0 fL  MCH 25.5 (L) 26.0 - 34.0 pg   MCHC 31.4 30.0 - 36.0 g/dL   RDW 14.1 11.5 - 15.5 %   Platelets 321 150 - 400 K/uL   Neutrophils Relative % 57 43 - 77 %   Neutro Abs 2.9 1.7 - 7.7 K/uL   Lymphocytes Relative 36 12 - 46 %   Lymphs Abs 1.9 0.7 - 4.0 K/uL   Monocytes Relative 4 3 - 12 %   Monocytes Absolute 0.2 0.1 - 1.0 K/uL   Eosinophils Relative 3 0 - 5 %   Eosinophils Absolute 0.2 0.0 - 0.7 K/uL   Basophils Relative 0 0 - 1 %   Basophils Absolute 0.0 0.0 - 0.1 K/uL  Comprehensive metabolic panel     Status: Abnormal   Collection Time: 03/12/15  8:22 AM  Result Value Ref Range   Sodium 139 135 - 145 mmol/L   Potassium 3.9 3.5 - 5.1 mmol/L   Chloride 105 101 - 111 mmol/L   CO2 22 22 - 32 mmol/L   Glucose, Bld 117 (H) 65  - 99 mg/dL   BUN 17 6 - 20 mg/dL   Creatinine, Ser 0.98 0.44 - 1.00 mg/dL   Calcium 9.3 8.9 - 10.3 mg/dL   Total Protein 8.3 (H) 6.5 - 8.1 g/dL   Albumin 4.6 3.5 - 5.0 g/dL   AST 21 15 - 41 U/L   ALT 23 14 - 54 U/L   Alkaline Phosphatase 134 (H) 38 - 126 U/L   Total Bilirubin 0.6 0.3 - 1.2 mg/dL   GFR calc non Af Amer >60 >60 mL/min   GFR calc Af Amer >60 >60 mL/min    Comment: (NOTE) The eGFR has been calculated using the CKD EPI equation. This calculation has not been validated in all clinical situations. eGFR's persistently <60 mL/min signify possible Chronic Kidney Disease.    Anion gap 12 5 - 15  Ethanol     Status: None   Collection Time: 03/12/15  8:22 AM  Result Value Ref Range   Alcohol, Ethyl (B) <5 <5 mg/dL    Comment:        LOWEST DETECTABLE LIMIT FOR SERUM ALCOHOL IS 11 mg/dL FOR MEDICAL PURPOSES ONLY     Vitals: Blood pressure 119/65, pulse 79, temperature 97.5 F (36.4 C), temperature source Oral, resp. rate 16, SpO2 98 %.  Risk to Self: Is patient at risk for suicide?: Yes Risk to Others:   Prior Inpatient Therapy:   Prior Outpatient Therapy:    Current Facility-Administered Medications  Medication Dose Route Frequency Provider Last Rate Last Dose  . acetaminophen (TYLENOL) tablet 650 mg  650 mg Oral Q4H PRN [name], MD      . alum & mag hydroxide-simeth (MAALOX/MYLANTA) 200-200-20 MG/5ML suspension 30 mL  30 mL Oral PRN [name], MD      . ibuprofen (ADVIL,MOTRIN) tablet 600 mg  600 mg Oral Q8H PRN [name], MD      . ondansetron (ZOFRAN) tablet 4 mg  4 mg Oral Q8H PRN [name], MD       Current Outpatient Prescriptions  Medication Sig Dispense Refill  . promethazine (PHENERGAN) 25 MG tablet Take 1 tablet (25 mg total) by mouth every 6 (six) hours as needed for nausea or vomiting. (Patient not taking: Reported on 03/12/2015) 5 tablet 0    Musculoskeletal: Strength & Muscle Tone: within normal limits Gait & Station:  normal Patient leans: N/A  Psychiatric Specialty Exam: Physical Exam  ROS  Blood pressure 119/65, pulse 79, temperature 97.5 F (36.4 C), temperature source Oral, resp. rate 16, SpO2 98 %.There is no weight on file to calculate BMI.  General Appearance: Disheveled  Eye Contact::  Minimal  Speech:  Slow  Volume:  Decreased  Mood:  Depressed  Affect:  Congruent  Thought Process:  Coherent  Orientation:  Full (Time, Place, and Person)  Thought Content:  Rumination  Suicidal Thoughts:  Yes.  without intent/plan  Homicidal Thoughts:  No  Memory:  Immediate;   Fair Recent;   Fair Remote;   Fair  Judgement:  Impaired  Insight:  Fair  Psychomotor Activity:  Decreased  Concentration:  Fair   Recall:  [employer] of Knowledge:Fair   Language: Fair   Akathisia:  No  Handed:  Right  AIMS (if indicated):     Assets:  Desire for Improvement Leisure Time Resilience  ADL's:  Intact  Cognition: WNL  Sleep:      Medical Decision Making: Review of Psycho-Social Stressors (1), Review or order clinical lab tests (1) and Review of Medication Regimen & Side Effects (2)  Treatment Plan Summary: Daily contact with patient to assess and evaluate symptoms and progress in treatment, Medication management and Plan admit to inpatient hospitalization for stabilization  Plan:  Recommend psychiatric Inpatient admission when medically cleared. Disposition: Admit  Lurena Nida 03/14/2015 1:09 AM  Patient seen face-to-face for psychiatric evaluation, chart reviewed and case discussed with the physician extender and developed treatment plan. Reviewed the information documented and agree with the treatment plan. Corena Pilgrim, MD

## 2015-03-14 NOTE — BH Assessment (Addendum)
[place] Assessment Progress Note  Per Corena Pilgrim, MD, this pt still requires psychiatric hospitalization.  No private facilities have agreed to accept pt up to this time.  Referral to [facility] has been initiated.  At 15:53 Lanelle Bal at the [facility] authorized admission from 03/14/2015 - 03/20/2015, authorization #[id number].  Please note that authorization does not mean that pt has been accepted to the facility.  At 16:01 I spoke to Robinette at [facility], providing demographic information; she agreed to accept referral information.  Information has been faxed.  [name], MA Triage Specialist [phone]   Addendum:  At 16:28 I spoke to [facility] at [facility], who confirms receipt of referral documentation.  Decision is pending as of this writing.  [name], MA Triage Specialist [phone]

## 2015-03-15 MED ORDER — LORAZEPAM 2 MG/ML IJ SOLN
2.0000 mg | Freq: Once | INTRAMUSCULAR | Status: AC
  Administered 2015-03-15: 2 mg via INTRAMUSCULAR
  Filled 2015-03-15: qty 1

## 2015-03-15 MED ORDER — OLANZAPINE 10 MG IM SOLR
10.0000 mg | Freq: Two times a day (BID) | INTRAMUSCULAR | Status: DC
  Administered 2015-03-15 – 2015-03-16 (×3): 10 mg via INTRAMUSCULAR
  Filled 2015-03-15 (×3): qty 10

## 2015-03-15 NOTE — ED Notes (Signed)
Pt crying, tearful, stating everything is being taken from her, she doesn't have a place to stay, reports she was living in her car and that was taken from her.  Pt on knees at present, singing to [place].

## 2015-03-15 NOTE — ED Notes (Signed)
Pt observed rocking in her bed, crying loudly, when approached pt stops crying and turns her head away from Probation officer.

## 2015-03-15 NOTE — ED Notes (Signed)
Pt walked up to nurses station, requesting graham crackers and peanut butter, pt got really loud talking about "monitoring devices military has put on me with touch component, don;t play like you don't know what I'm talking about, you better cut this shit off of me or I will sue all of y'all for it." Pt was yelling at off duty [employer] officer, and after a few minutes walked back to her room. Will continue to monitor.

## 2015-03-15 NOTE — Progress Notes (Addendum)
Pt noted to be at [facility] today 03/15/15 slapping the desk and screaming and crying at intervals about how "they" are not treating her right Pt referring to someone but not looking at anyone Belligerent.  Cm discussed with [facility] RN that pt noted to have similar behavior on 03/13/15, 03/14/15 & 03/15/15 generally between 1100 and 1200.  Pt being transferred from TCU to [facility]

## 2015-03-15 NOTE — ED Notes (Signed)
Pt is getting verbally aggressive while at nurses station, banging her fist on the counter, screaming loudly "What do you want from me you mad dogs, leave me alone, I have two masters degrees and unlike you I am viable and valuable part of society. You mad dogs". Security and off-duty officer called to help move pt to room #40 in [facility].

## 2015-03-15 NOTE — ED Notes (Signed)
Pt continues to scream out in intervals.

## 2015-03-15 NOTE — ED Notes (Signed)
Patient is currently sleeping, informed attending RN. We will retrieve her vitals once she wakes up.

## 2015-03-15 NOTE — ED Notes (Signed)
Pt from another room attempting to come to Loretta's room to make her be quiet, she states.  Pt redirected back to room with assist of security and [employer].

## 2015-03-15 NOTE — Consult Note (Signed)
[facility] Face-to-Face Psychiatry Consult   Reason for Consult:  Suicidal threats Referring Physician:  EDP Patient Identification: [name] MRN:  [id number] Principal Diagnosis: Severe recurrent major depression without psychotic features Diagnosis:   Patient Active Problem List   Diagnosis Date Noted  . Severe recurrent major depression without psychotic features [F33.2] 03/12/2015    Priority: High  . Suicidal ideations [R45.851] 03/12/2015    Priority: High  . Diabetes [E11.9] 09/06/2012  . Delusional disorder(297.1) [F22] 04/16/2012    Total Time spent with patient: 30 minutes  Subjective:   [name] is a 49 y.o. female patient admitted with psychosis.  HPI:  The patient continues to be depressed with suicidal ideations.  She does not appear to be responding to internal stimuli today but forwards little information.  Currently, eating/drinking and watching television. HPI Elements:   Location:  generalized. Quality:  acute. Severity:  severe. Timing:  constants. Duration:  few days. Context:  stressors.  Past Medical History:  Past Medical History  Diagnosis Date  . Anxiety   . Major depressive disorder, recurrent, severe with psychotic features   . Diabetes mellitus without complication    History reviewed. No pertinent past surgical history. Family History:  Family History  Problem Relation Age of Onset  . Hypertension Other   . Diabetes Other    Social History:  History  Alcohol Use  . Yes    Comment: socially     History  Drug Use No    History   Social History  . Marital Status: Single    Spouse Name: N/A  . Number of Children: N/A  . Years of Education: N/A   Social History Main Topics  . Smoking status: Never Smoker   . Smokeless tobacco: Never Used  . Alcohol Use: Yes     Comment: socially  . Drug Use: No  . Sexual Activity: Not on file   Other Topics Concern  . None   Social History Narrative   Additional Social History:                          Allergies:  No Known Allergies  Labs:  Results for orders placed or performed during the hospital encounter of 03/12/15 (from the past 48 hour(s))  Urinalysis, Routine w reflex microscopic     Status: Abnormal   Collection Time: 03/14/15  7:02 AM  Result Value Ref Range   Color, Urine YELLOW YELLOW   APPearance CLOUDY (A) CLEAR   Specific Gravity, Urine 1.020 1.005 - 1.030   pH 5.5 5.0 - 8.0   Glucose, UA NEGATIVE NEGATIVE mg/dL   Hgb urine dipstick NEGATIVE NEGATIVE   Bilirubin Urine NEGATIVE NEGATIVE   Ketones, ur NEGATIVE NEGATIVE mg/dL   Protein, ur NEGATIVE NEGATIVE mg/dL   Urobilinogen, UA 0.2 0.0 - 1.0 mg/dL   Nitrite NEGATIVE NEGATIVE   Leukocytes, UA SMALL (A) NEGATIVE  Pregnancy, urine     Status: None   Collection Time: 03/14/15  7:02 AM  Result Value Ref Range   Preg Test, Ur NEGATIVE NEGATIVE    Comment:        THE SENSITIVITY OF THIS METHODOLOGY IS >20 mIU/mL.   Urine microscopic-add on     Status: Abnormal   Collection Time: 03/14/15  7:02 AM  Result Value Ref Range   Squamous Epithelial / LPF RARE RARE   WBC, UA 7-10 <3 WBC/hpf   RBC / HPF 0-2 <3 RBC/hpf   Bacteria,  UA MANY (A) RARE   Casts GRANULAR CAST (A) NEGATIVE  CBG monitoring, ED     Status: Abnormal   Collection Time: 03/14/15 11:00 AM  Result Value Ref Range   Glucose-Capillary 125 (H) 65 - 99 mg/dL  Drug screen panel, emergency     Status: Abnormal   Collection Time: 03/14/15  7:00 PM  Result Value Ref Range   Opiates NONE DETECTED NONE DETECTED   Cocaine NONE DETECTED NONE DETECTED   Benzodiazepines POSITIVE (A) NONE DETECTED   Amphetamines NONE DETECTED NONE DETECTED   Tetrahydrocannabinol NONE DETECTED NONE DETECTED   Barbiturates NONE DETECTED NONE DETECTED    Comment:        DRUG SCREEN FOR MEDICAL PURPOSES ONLY.  IF CONFIRMATION IS NEEDED FOR ANY PURPOSE, NOTIFY LAB WITHIN 5 DAYS.        LOWEST DETECTABLE LIMITS FOR URINE DRUG SCREEN Drug Class        Cutoff (ng/mL) Amphetamine      1000 Barbiturate      200 Benzodiazepine   703 Tricyclics       500 Opiates          300 Cocaine          300 THC              50     Vitals: Blood pressure 106/56, pulse 68, temperature 98.6 F (37 C), temperature source Axillary, resp. rate 17, SpO2 94 %.  Risk to Self: Is patient at risk for suicide?: Yes Risk to Others:   Prior Inpatient Therapy:   Prior Outpatient Therapy:    Current Facility-Administered Medications  Medication Dose Route Frequency Provider Last Rate Last Dose  . acetaminophen (TYLENOL) tablet 650 mg  650 mg Oral Q4H PRN [name], MD      . alum & mag hydroxide-simeth (MAALOX/MYLANTA) 200-200-20 MG/5ML suspension 30 mL  30 mL Oral PRN [name], MD      . carbamazepine (TEGRETOL) tablet 200 mg  200 mg Oral BID PC Fredia Chittenden   200 mg at 03/14/15 1928  . ibuprofen (ADVIL,MOTRIN) tablet 600 mg  600 mg Oral Q8H PRN [name], MD      . OLANZapine zydis (ZYPREXA) disintegrating tablet 10 mg  10 mg Oral BID PC Clarene Curran   10 mg at 03/14/15 1928  . OLANZapine zydis (ZYPREXA) disintegrating tablet 10 mg  10 mg Oral Q8H PRN Maxwell Martorano      . ondansetron (ZOFRAN) tablet 4 mg  4 mg Oral Q8H PRN [name], MD      . traZODone (DESYREL) tablet 100 mg  100 mg Oral QHS PRN Hendrick Pavich       Current Outpatient Prescriptions  Medication Sig Dispense Refill  . promethazine (PHENERGAN) 25 MG tablet Take 1 tablet (25 mg total) by mouth every 6 (six) hours as needed for nausea or vomiting. (Patient not taking: Reported on 03/12/2015) 5 tablet 0    Musculoskeletal: Strength & Muscle Tone: within normal limits Gait & Station: normal Patient leans: N/A  Psychiatric Specialty Exam: Physical Exam  ROS  Blood pressure 106/56, pulse 68, temperature 98.6 F (37 C), temperature source Axillary, resp. rate 17, SpO2 94 %.There is no weight on file to calculate BMI.  General Appearance: Disheveled  Eye  Contact::  Minimal  Speech:  Normal Rate  Volume:  Normal  Mood:  Anxious, Depressed and Irritable  Affect:  Blunt  Thought Process:  Disorganized  Orientation:  Other:  person  Thought Content: WDL  Suicidal Thoughts:  Yes.  with intent/plan  Homicidal Thoughts:  No  Memory:  Immediate;   Fair Recent;   Poor Remote;   Poor  Judgement:  Impaired  Insight:  Fair  Psychomotor Activity:  Normal  Concentration:  Poor  Recall:  Poor  Fund of Knowledge:Fair  Language: Fair  Akathisia:  No  Handed:  Right  AIMS (if indicated):     Assets:  Leisure Time Physical Health Resilience  ADL's:  Intact  Cognition: WNL  Sleep:      Medical Decision Making: Review of Psycho-Social Stressors (1), Review or order clinical lab tests (1) and Review of Medication Regimen & Side Effects (2)  Treatment Plan Summary: Daily contact with patient to assess and evaluate symptoms and progress in treatment, Medication management and Plan admit to inpatient psychiatry for stabilization  Plan:  Recommend psychiatric Inpatient admission when medically cleared. Disposition: Johny Sax, PMH-NP 03/15/2015 10:34 AM Patient seen face-to-face for psychiatric evaluation, chart reviewed and case discussed with the physician extender and developed treatment plan. Reviewed the information documented and agree with the treatment plan. Corena Pilgrim, MD

## 2015-03-15 NOTE — BH Assessment (Signed)
Dr. Darleene [name] and Jamie Boga, DNP continue to recommend inpatient treatment. The providers requested collateral information from family or friends. Patient refused to provide this Probation officer with any names or numbers of individuals that may attest to her psychiatric history. Writer found [name],[name] (Grandmother) [phone] and tried to reach out for collateral information. This contact was not viable.

## 2015-03-15 NOTE — ED Notes (Signed)
Pt screaming and hollering out in room, auditory hallucinations noted, awake, alert & responsive, no distress noted, monitoring for safety, Q 15 min checks in effect.

## 2015-03-15 NOTE — BH Assessment (Addendum)
[facility] Assessment Progress Note  [facility] reportedly requested additional information for this pt, who was referred to them yesterday, 03/14/2015.  This was to include urine drug screen, urine pregnancy, height and weight.  This information was faxed to them at 09:45 and they have confirmed receipt.  Decision is pending at this time.  [name], MA Triage Specialist [phone]  Addendum:  At 09:50 Marlowe Kays called from [facility].  Pt has been placed on their wait list.  [name], [place] Triage Specialist [phone]

## 2015-03-15 NOTE — ED Notes (Signed)
Pt crying out and screaming, stating she is stressed. NP Ij[place] notified.

## 2015-03-16 MED ORDER — ZIPRASIDONE MESYLATE 20 MG IM SOLR
20.0000 mg | Freq: Once | INTRAMUSCULAR | Status: AC
  Administered 2015-03-16: 20 mg via INTRAMUSCULAR
  Filled 2015-03-16: qty 20

## 2015-03-16 MED ORDER — LORAZEPAM 2 MG/ML IJ SOLN
2.0000 mg | Freq: Once | INTRAMUSCULAR | Status: AC
  Administered 2015-03-16: 2 mg via INTRAMUSCULAR
  Filled 2015-03-16: qty 1

## 2015-03-16 MED ORDER — DIPHENHYDRAMINE HCL 50 MG/ML IJ SOLN
50.0000 mg | Freq: Once | INTRAMUSCULAR | Status: AC
  Administered 2015-03-16: 50 mg via INTRAMUSCULAR
  Filled 2015-03-16: qty 1

## 2015-03-16 NOTE — Consult Note (Signed)
[facility] Face-to-Face Psychiatry Consult   Reason for Consult:  Suicidal threats Referring Physician:  EDP Patient Identification: [name] MRN:  [id number] Principal Diagnosis: Severe recurrent major depression without psychotic features Diagnosis:   Patient Active Problem List   Diagnosis Date Noted  . Severe recurrent major depression without psychotic features [F33.2] 03/12/2015    Priority: High  . Suicidal ideations [R45.851] 03/12/2015    Priority: High  . Diabetes [E11.9] 09/06/2012  . Delusional disorder(297.1) [F22] 04/16/2012    Total Time spent with patient: 30 minutes  Subjective:   [name] is a 49 y.o. female patient admitted with psychosis.  HPI:  The patient continues to have outbursts of crying and screaming but can stop quickly.  Today, she continued with this behavior and throwing her coffee until she had to have PRN medications.  She continues to forward little verbal dialogue during rounds. HPI Elements:   Location:  generalized. Quality:  acute. Severity:  severe. Timing:  constants. Duration:  few days. Context:  stressors.  Past Medical History:  Past Medical History  Diagnosis Date  . Anxiety   . Major depressive disorder, recurrent, severe with psychotic features   . Diabetes mellitus without complication    History reviewed. No pertinent past surgical history. Family History:  Family History  Problem Relation Age of Onset  . Hypertension Other   . Diabetes Other    Social History:  History  Alcohol Use  . Yes    Comment: socially     History  Drug Use No    History   Social History  . Marital Status: Single    Spouse Name: N/A  . Number of Children: N/A  . Years of Education: N/A   Social History Main Topics  . Smoking status: Never Smoker   . Smokeless tobacco: Never Used  . Alcohol Use: Yes     Comment: socially  . Drug Use: No  . Sexual Activity: Not on file   Other Topics Concern  . None   Social History  Narrative   Additional Social History:                          Allergies:  No Known Allergies  Labs:  Results for orders placed or performed during the hospital encounter of 03/12/15 (from the past 48 hour(s))  Drug screen panel, emergency     Status: Abnormal   Collection Time: 03/14/15  7:00 PM  Result Value Ref Range   Opiates NONE DETECTED NONE DETECTED   Cocaine NONE DETECTED NONE DETECTED   Benzodiazepines POSITIVE (A) NONE DETECTED   Amphetamines NONE DETECTED NONE DETECTED   Tetrahydrocannabinol NONE DETECTED NONE DETECTED   Barbiturates NONE DETECTED NONE DETECTED    Comment:        DRUG SCREEN FOR MEDICAL PURPOSES ONLY.  IF CONFIRMATION IS NEEDED FOR ANY PURPOSE, NOTIFY LAB WITHIN 5 DAYS.        LOWEST DETECTABLE LIMITS FOR URINE DRUG SCREEN Drug Class       Cutoff (ng/mL) Amphetamine      1000 Barbiturate      200 Benzodiazepine   378 Tricyclics       588 Opiates          300 Cocaine          300 THC              50     Vitals: Blood pressure 116/100, pulse 92, temperature  98.3 F (36.8 C), temperature source Oral, resp. rate 17, SpO2 94 %.  Risk to Self: Is patient at risk for suicide?: Yes Risk to Others:   Prior Inpatient Therapy:   Prior Outpatient Therapy:    Current Facility-Administered Medications  Medication Dose Route Frequency Provider Last Rate Last Dose  . acetaminophen (TYLENOL) tablet 650 mg  650 mg Oral Q4H PRN [name], MD      . alum & mag hydroxide-simeth (MAALOX/MYLANTA) 200-200-20 MG/5ML suspension 30 mL  30 mL Oral PRN [name], MD      . carbamazepine (TEGRETOL) tablet 200 mg  200 mg Oral BID PC Arris Meyn   200 mg at 03/16/15 0539  . ibuprofen (ADVIL,MOTRIN) tablet 600 mg  600 mg Oral Q8H PRN [name], MD      . OLANZapine (ZYPREXA) injection 10 mg  10 mg Intramuscular BID Patrecia Pour, NP   10 mg at 03/16/15 7673  . OLANZapine zydis (ZYPREXA) disintegrating tablet 10 mg  10 mg Oral Q8H PRN  Journee Kohen   10 mg at 03/16/15 1216  . ondansetron (ZOFRAN) tablet 4 mg  4 mg Oral Q8H PRN [name], MD      . traZODone (DESYREL) tablet 100 mg  100 mg Oral QHS PRN Ronav Furney       Current Outpatient Prescriptions  Medication Sig Dispense Refill  . promethazine (PHENERGAN) 25 MG tablet Take 1 tablet (25 mg total) by mouth every 6 (six) hours as needed for nausea or vomiting. (Patient not taking: Reported on 03/12/2015) 5 tablet 0    Musculoskeletal: Strength & Muscle Tone: within normal limits Gait & Station: normal Patient leans: N/A  Psychiatric Specialty Exam: Physical Exam  ROS  Blood pressure 116/100, pulse 92, temperature 98.3 F (36.8 C), temperature source Oral, resp. rate 17, SpO2 94 %.There is no weight on file to calculate BMI.  General Appearance: Disheveled  Eye Contact::  Minimal  Speech:  Normal Rate  Volume:  Normal  Mood:  Anxious, Depressed and Irritable  Affect:  Blunt  Thought Process:  Disorganized  Orientation:  Other:  person  Thought Content: WDL  Suicidal Thoughts:  Yes.  with intent/plan  Homicidal Thoughts:  No  Memory:  Immediate;   Fair Recent;   Poor Remote;   Poor  Judgement:  Impaired  Insight:  Fair  Psychomotor Activity:  Normal  Concentration:  Poor  Recall:  Poor  Fund of Knowledge:Fair  Language: Fair  Akathisia:  No  Handed:  Right  AIMS (if indicated):     Assets:  Leisure Time Physical Health Resilience  ADL's:  Intact  Cognition: WNL  Sleep:      Medical Decision Making: Review of Psycho-Social Stressors (1), Review or order clinical lab tests (1) and Review of Medication Regimen & Side Effects (2)  Treatment Plan Summary: Daily contact with patient to assess and evaluate symptoms and progress in treatment, Medication management and Plan admit to inpatient psychiatry for stabilization  Plan:  Recommend psychiatric Inpatient admission when medically cleared. Disposition: Johny Sax,  PMH-NP 03/16/2015 2:25 PM Patient seen face-to-face for psychiatric evaluation, chart reviewed and case discussed with the physician extender and developed treatment plan. Reviewed the information documented and agree with the treatment plan. Corena Pilgrim, MD

## 2015-03-16 NOTE — BH Assessment (Signed)
[place] Assessment Progress Note  At 10:00 [name] at [facility] reports that pt remains on their wait list.  [name], MA Triage Specialist [phone]

## 2015-03-16 NOTE — ED Notes (Signed)
Pt became acutely upset. Started screaming out uncontrollably and threw her bedside table into the wall, sending coffee and hot chocolate all over the room. NP aware and orders were given and carried out.

## 2015-03-16 NOTE — ED Notes (Addendum)
Spoke with Lynelle Smoke, Therapist, sports at [employer]. She requests that report be called immediately AFTER she leaves. Report can be called to [phone]

## 2015-04-11 ENCOUNTER — Emergency Department (HOSPITAL_BASED_OUTPATIENT_CLINIC_OR_DEPARTMENT_OTHER)
Admission: EM | Admit: 2015-04-11 | Discharge: 2015-04-11 | Disposition: A | Discharge status: Home / Self Care | Payer: Self-pay | Attending: Emergency Medicine | Admitting: Emergency Medicine

## 2015-04-11 ENCOUNTER — Encounter (HOSPITAL_BASED_OUTPATIENT_CLINIC_OR_DEPARTMENT_OTHER): Payer: Self-pay

## 2015-04-11 DIAGNOSIS — E119 Type 2 diabetes mellitus without complications: Secondary | ICD-10-CM | POA: Insufficient documentation

## 2015-04-11 DIAGNOSIS — Z3202 Encounter for pregnancy test, result negative: Secondary | ICD-10-CM | POA: Insufficient documentation

## 2015-04-11 DIAGNOSIS — Z79899 Other long term (current) drug therapy: Secondary | ICD-10-CM | POA: Insufficient documentation

## 2015-04-11 DIAGNOSIS — Z8659 Personal history of other mental and behavioral disorders: Secondary | ICD-10-CM | POA: Insufficient documentation

## 2015-04-11 DIAGNOSIS — R110 Nausea: Secondary | ICD-10-CM | POA: Insufficient documentation

## 2015-04-11 DIAGNOSIS — R1084 Generalized abdominal pain: Secondary | ICD-10-CM | POA: Insufficient documentation

## 2015-04-11 LAB — URINALYSIS, ROUTINE W REFLEX MICROSCOPIC
Bilirubin Urine: NEGATIVE
Glucose, UA: NEGATIVE mg/dL
HGB URINE DIPSTICK: NEGATIVE
Ketones, ur: NEGATIVE mg/dL
Nitrite: NEGATIVE
PROTEIN: NEGATIVE mg/dL
SPECIFIC GRAVITY, URINE: 1.014 (ref 1.005–1.030)
UROBILINOGEN UA: 0.2 mg/dL (ref 0.0–1.0)
pH: 5.5 (ref 5.0–8.0)

## 2015-04-11 LAB — CBC
HCT: 35.3 % — ABNORMAL LOW (ref 36.0–46.0)
Hemoglobin: 11.3 g/dL — ABNORMAL LOW (ref 12.0–15.0)
MCH: 25.7 pg — ABNORMAL LOW (ref 26.0–34.0)
MCHC: 32 g/dL (ref 30.0–36.0)
MCV: 80.4 fL (ref 78.0–100.0)
Platelets: 291 10*3/uL (ref 150–400)
RBC: 4.39 MIL/uL (ref 3.87–5.11)
RDW: 14.2 % (ref 11.5–15.5)
WBC: 5 10*3/uL (ref 4.0–10.5)

## 2015-04-11 LAB — COMPREHENSIVE METABOLIC PANEL
ALBUMIN: 4 g/dL (ref 3.5–5.0)
ALT: 19 U/L (ref 14–54)
AST: 21 U/L (ref 15–41)
Alkaline Phosphatase: 124 U/L (ref 38–126)
Anion gap: 10 (ref 5–15)
BUN: 16 mg/dL (ref 6–20)
CALCIUM: 9 mg/dL (ref 8.9–10.3)
CO2: 23 mmol/L (ref 22–32)
Chloride: 108 mmol/L (ref 101–111)
Creatinine, Ser: 0.94 mg/dL (ref 0.44–1.00)
GLUCOSE: 113 mg/dL — AB (ref 65–99)
Potassium: 4 mmol/L (ref 3.5–5.1)
Sodium: 141 mmol/L (ref 135–145)
TOTAL PROTEIN: 7.1 g/dL (ref 6.5–8.1)
Total Bilirubin: 0.2 mg/dL — ABNORMAL LOW (ref 0.3–1.2)

## 2015-04-11 LAB — URINE MICROSCOPIC-ADD ON

## 2015-04-11 LAB — LIPASE, BLOOD: Lipase: 18 U/L — ABNORMAL LOW (ref 22–51)

## 2015-04-11 LAB — PREGNANCY, URINE: Preg Test, Ur: NEGATIVE

## 2015-04-11 MED ORDER — OXYCODONE-ACETAMINOPHEN 5-325 MG PO TABS: 1.0000 | ORAL_TABLET | Freq: Once | ORAL | Status: DC

## 2015-04-11 NOTE — ED Notes (Signed)
Pt's sister brought pt in to ED-requested to speak with me in private-pt became angry stating that she did not authorize her sister to speak to me on her behalf-sister advised pt she is leaving and left the triage room

## 2015-04-11 NOTE — ED Notes (Signed)
EDP made aware of why pt will not receive pain med and agrees that pt needs to have a ride prior to administration.

## 2015-04-11 NOTE — ED Provider Notes (Addendum)
CSN: [id number]     Arrival date & time 04/11/15  2125 History  This chart was scribed for Alfonzo Beers, MD by Stephania Fragmin, ED Scribe. This patient was seen in room MH05/MH05 and the patient's care was started at 10:19 PM.    Chief Complaint  Patient presents with  . Abdominal Pain   The history is provided by the patient. No language interpreter was used.    HPI Comments: [name] is a 49 y.o. female with a history of anxiety, depression, and DM, who presents to the Emergency Department complaining of constant, moderate epigastric abdominal pain that began today. She complains of associated nausea and the sense that she feels like she is about to have diarrhea, although she has not yet had any diarrhea. Patient states she ate 2 servings of ramen noodles that was slightly malodorous within the past 3 days. She also shrimp with linguini noodles today which she states she feels is still sitting in her stomach. Patient states she had been to the [facility] ED earlier today and was only given a sublingual Zofran tablet with no relief.   Past Medical History  Diagnosis Date  . Anxiety   . Major depressive disorder, recurrent, severe with psychotic features   . Diabetes mellitus without complication    History reviewed. No pertinent past surgical history. Family History  Problem Relation Age of Onset  . Hypertension Other   . Diabetes Other    History  Substance Use Topics  . Smoking status: Never Smoker   . Smokeless tobacco: Never Used  . Alcohol Use: Yes   OB History    No data available     Review of Systems  Gastrointestinal: Positive for nausea and abdominal pain. Negative for diarrhea.  All other systems reviewed and are negative.   Allergies  Review of patient's allergies indicates no known allergies.  Home Medications   Prior to Admission medications   Medication Sig Start Date End Date Taking? Authorizing Provider  Ondansetron (ZOFRAN ODT PO) Take by  mouth.   Yes Historical Provider, MD  dicyclomine (BENTYL) 20 MG tablet Take 1 tablet (20 mg total) by mouth 2 (two) times daily. 04/12/15   Comer Locket, PA-C  omeprazole (PRILOSEC) 20 MG capsule Take 1 capsule (20 mg total) by mouth daily. 04/12/15   Benjamin Cartner, PA-C   BP 135/50 mmHg  Pulse 79  Temp(Src) 98.4 F (36.9 C) (Oral)  Resp 18  Ht 5\' 5"  (1.651 m)  Wt 252 lb (114.306 kg)  BMI 41.93 kg/m2  SpO2 99%  LMP 12/25/2009  Vitals reviewed Physical Exam  Physical Examination: General appearance - alert, well appearing, and in no distress Mental status - alert, oriented to person, place, and time Eyes - no conjunctival injection, no scleral icerus Mouth - mucous membranes moist, pharynx normal without lesions Chest - clear to auscultation, no wheezes, rales or rhonchi, symmetric air entry Heart - normal rate, regular rhythm, normal S1, S2, no murmurs, rubs, clicks or gallops Abdomen - soft, nontender, nondistended, no masses or organomegaly, nabs Neurological - alert, oriented Extremities - peripheral pulses normal, no pedal edema, no clubbing or cyanosis Skin - normal coloration and turgor, no rashes  ED Course  Procedures (including critical care time)  DIAGNOSTIC STUDIES: Oxygen Saturation is 99% on RA, normal by my interpretation.    COORDINATION OF CARE: 10:24 PM - Discussed treatment plan with pt at bedside which includes diagnostic blood tests, and pt agreed to plan.  Labs Review Labs Reviewed  URINALYSIS, ROUTINE W REFLEX MICROSCOPIC (NOT AT [facility]) - Abnormal; Notable for the following:    Leukocytes, UA SMALL (*)    All other components within normal limits  CBC - Abnormal; Notable for the following:    Hemoglobin 11.3 (*)    HCT 35.3 (*)    MCH 25.7 (*)    All other components within normal limits  COMPREHENSIVE METABOLIC PANEL - Abnormal; Notable for the following:    Glucose, Bld 113 (*)    Total Bilirubin 0.2 (*)    All other components within  normal limits  LIPASE, BLOOD - Abnormal; Notable for the following:    Lipase 18 (*)    All other components within normal limits  PREGNANCY, URINE  URINE MICROSCOPIC-ADD ON    MDM   Final diagnoses:  Generalized abdominal pain  Nausea   Pt presenting with diffuse abdominal cramping, nausea without vomiting.  She has reassuring labs, treated with zofran for nausea.  Pt was seen earlier today at [facility]- she states she had the same treatment there and was wanting a second opinion.  Pt offered one dose of pain medication, but she did not have ride home so this was not given.  No indication for narcotic pain medications at home.  Doubt appendicitis, ectopic, TOA, cholecystitis, or other acute emergent pathology at this time.   Discharged with strict return precautions.  Pt agreeable with plan.    I personally performed the services described in this documentation, which was scribed in my presence. The recorded information has been reviewed and is accurate.     Alfonzo Beers, MD 04/14/15 [place], MD 04/14/15 [phone]

## 2015-04-11 NOTE — Discharge Instructions (Signed)
Return to the ED with any concerns including vomiting and not able to keep down liquids, abdominal pain that localizes to your right lower abdomen, decreased level of alertness/lethargy, or any other alarming symptoms

## 2015-04-11 NOTE — ED Notes (Signed)
Pt states she doesn thave a ride , informed pt that she could not get narcotic pain meds unless there is a ride present.  Pt demanding pain meds

## 2015-04-11 NOTE — ED Notes (Signed)
C/o abd pain, nausea started today-was seen at [facility] today

## 2015-04-12 ENCOUNTER — Emergency Department (HOSPITAL_BASED_OUTPATIENT_CLINIC_OR_DEPARTMENT_OTHER)
Admission: EM | Admit: 2015-04-12 | Discharge: 2015-04-12 | Disposition: A | Discharge status: Home / Self Care | Payer: Self-pay | Attending: Emergency Medicine | Admitting: Emergency Medicine

## 2015-04-12 ENCOUNTER — Encounter (HOSPITAL_BASED_OUTPATIENT_CLINIC_OR_DEPARTMENT_OTHER): Payer: Self-pay | Admitting: *Deleted

## 2015-04-12 DIAGNOSIS — R109 Unspecified abdominal pain: Secondary | ICD-10-CM | POA: Insufficient documentation

## 2015-04-12 DIAGNOSIS — E119 Type 2 diabetes mellitus without complications: Secondary | ICD-10-CM | POA: Insufficient documentation

## 2015-04-12 DIAGNOSIS — Z8659 Personal history of other mental and behavioral disorders: Secondary | ICD-10-CM | POA: Insufficient documentation

## 2015-04-12 MED ORDER — HYDROCODONE-ACETAMINOPHEN 5-325 MG PO TABS
1.0000 | ORAL_TABLET | Freq: Once | ORAL | Status: AC
  Administered 2015-04-12: 1 via ORAL
  Filled 2015-04-12: qty 1

## 2015-04-12 MED ORDER — DICYCLOMINE HCL 20 MG PO TABS: 20.0000 mg | ORAL_TABLET | Freq: Two times a day (BID) | ORAL | Status: DC

## 2015-04-12 MED ORDER — OMEPRAZOLE 20 MG PO CPDR: 20.0000 mg | DELAYED_RELEASE_CAPSULE | Freq: Every day | ORAL | Status: DC

## 2015-04-12 MED ORDER — GI COCKTAIL ~~LOC~~
30.0000 mL | Freq: Once | ORAL | Status: AC
  Administered 2015-04-12: 30 mL via ORAL
  Filled 2015-04-12: qty 30

## 2015-04-12 NOTE — ED Notes (Signed)
Was seen at [facility] yesterday then here yesterday. She was not given pain medication due to no ride. She is here today to get pain medication. She has sharp abdominal pain. She has a ride today.

## 2015-04-12 NOTE — ED Notes (Signed)
Pt states she is here today for pain medicine, "they couldn't give me none yesterday because I didn't have a ride". Pt states she has food poisoning from eating bad shrimp linguine on Tuesday night. Denies any vomiting or diarrhea today.

## 2015-04-12 NOTE — ED Provider Notes (Signed)
CSN: [id number]     Arrival date & time 04/12/15  1251 History   First MD Initiated Contact with Patient 04/12/15 1257     Chief Complaint  Patient presents with  . Abdominal Pain     (Consider location/radiation/quality/duration/timing/severity/associated sxs/prior Treatment) HPI [name] is a 49 y.o. female who comes in for evaluation of abdominal discomfort. Patient says she was seen here in the ED yesterday as well as [facility] for her abdominal discomfort. She returns today for pain medicine because she was unable to get yesterday due to not having a ride. She attributes her abdominal discomfort to eating "chow mein and a seafood dinner from the drugstore, and now have fish scales in my stomach that hurt ". She reports associated nausea and gagging without any emesis. She reports diarrhea yesterday, but no bowel movement today. She has only tried Zofran without relief of her symptoms. She denies any urinary symptoms, no vaginal bleeding, discharge or pelvic pain. Denies fevers, chills, source of breath, chest pain.  Past Medical History  Diagnosis Date  . Anxiety   . Major depressive disorder, recurrent, severe with psychotic features   . Diabetes mellitus without complication    History reviewed. No pertinent past surgical history. Family History  Problem Relation Age of Onset  . Hypertension Other   . Diabetes Other    History  Substance Use Topics  . Smoking status: Never Smoker   . Smokeless tobacco: Never Used  . Alcohol Use: Yes   OB History    No data available     Review of Systems A 10 point review of systems was completed and was negative except for pertinent positives and negatives as mentioned in the history of present illness     Allergies  Review of patient's allergies indicates no known allergies.  Home Medications   Prior to Admission medications   Medication Sig Start Date End Date Taking? Authorizing Provider  dicyclomine (BENTYL)  20 MG tablet Take 1 tablet (20 mg total) by mouth 2 (two) times daily. 04/12/15   Comer Locket, PA-C  omeprazole (PRILOSEC) 20 MG capsule Take 1 capsule (20 mg total) by mouth daily. 04/12/15   Comer Locket, PA-C  Ondansetron (ZOFRAN ODT PO) Take by mouth.    Historical Provider, MD   BP 121/72 mmHg  Pulse 84  Temp(Src) 98.4 F (36.9 C) (Oral)  Resp 16  Ht 5' 5.5" (1.664 m)  Wt 259 lb (117.482 kg)  BMI 42.43 kg/m2  SpO2 99%  LMP 12/25/2009 Physical Exam  Constitutional: She is oriented to person, place, and time. She appears well-developed and well-nourished.  Morbidly obese  HENT:  Head: Normocephalic and atraumatic.  Mouth/Throat: Oropharynx is clear and moist.  Eyes: Conjunctivae are normal. Pupils are equal, round, and reactive to light. Right eye exhibits no discharge. Left eye exhibits no discharge. No scleral icterus.  Neck: Neck supple.  Cardiovascular: Normal rate, regular rhythm and normal heart sounds.   Pulmonary/Chest: Effort normal and breath sounds normal. No respiratory distress. She has no wheezes. She has no rales.  Abdominal: Soft.  No focal abdominal tenderness. No rebound or guarding. Abdomen soft and without distention. No obvious deformities, lesions or palpable masses.  Musculoskeletal: She exhibits no tenderness.  Neurological: She is alert and oriented to person, place, and time.  Cranial Nerves II-XII grossly intact  Skin: Skin is warm and dry. No rash noted.  Psychiatric: She has a normal mood and affect.  Nursing note and vitals reviewed.  ED Course  Procedures (including critical care time) Labs Review Labs Reviewed - No data to display  Imaging Review No results found.   EKG Interpretation None      Meds given in ED:  Medications  gi cocktail (Maalox,Lidocaine,Donnatal) (30 mLs Oral Given 04/12/15 1323)  HYDROcodone-acetaminophen (NORCO/VICODIN) 5-325 MG per tablet 1 tablet (1 tablet Oral Given 04/12/15 1434)    New  Prescriptions   DICYCLOMINE (BENTYL) 20 MG TABLET    Take 1 tablet (20 mg total) by mouth 2 (two) times daily.   OMEPRAZOLE (PRILOSEC) 20 MG CAPSULE    Take 1 capsule (20 mg total) by mouth daily.   Filed Vitals:   04/12/15 1256  BP: 121/72  Pulse: 84  Temp: 98.4 F (36.9 C)  TempSrc: Oral  Resp: 16  Height: 5' 5.5" (1.664 m)  Weight: 259 lb (117.482 kg)  SpO2: 99%     MDM  Vitals stable - WNL -afebrile Pt resting comfortably in ED. PE--abdominal exam is not concerning. Grossly benign physical exam. Labwork--labs obtained yesterday including CBC, Cmet, lipase, urinalysis, pregnancy, or not concerning for any acute or emergent pathology. No evidence of infection. Negative pregnancy  DDX--patient says she feels much better after administration of GI cocktail. The discomfort does not appear related to any acute or emergent pathology. Potentially gastritis versus colonic spasms. Doubt appendicitis, ectopic, TOA. Will DC with PPI and Bentyl for discomfort. Discussed follow-up with primary care for further evaluation and management of symptoms and patient agrees to plan.  I discussed all relevant lab findings and imaging results with pt and they verbalized understanding. Discussed f/u with PCP within 48 hrs and return precautions, pt very amenable to plan.  Final diagnoses:  Abdominal discomfort        Comer Locket, PA-C 04/12/15 1437  Blanchie Dessert, MD 04/12/15 1511

## 2015-04-12 NOTE — Discharge Instructions (Signed)
You were evaluated in the ED today for your abdominal discomfort. There is not appear to be an emergent cause her symptoms at this time. Your exam, labs were all reassuring. Please take your medications as prescribed to help with your discomfort. The Bentyl is for abdominal spasms and your omeprazole is for stomach acid. Please follow-up with your primary care for further evaluation and management of your symptoms. Return to ED for new or worsening symptoms.

## 2015-04-12 NOTE — ED Notes (Addendum)
Pt called out asking to speak to RN. Upon following up on request, pt stated she wanted to know if she would be sent home with a prescription for pain medicine. Pt was told that this would be the provider's decision. Spoke with [employer], PA and states pt may have one Vicodin here but will not be sent home with pain medicine.

## 2015-04-14 DIAGNOSIS — F4325 Adjustment disorder with mixed disturbance of emotions and conduct: Secondary | ICD-10-CM | POA: Insufficient documentation

## 2015-04-14 DIAGNOSIS — F29 Unspecified psychosis not due to a substance or known physiological condition: Secondary | ICD-10-CM | POA: Insufficient documentation

## 2015-04-14 DIAGNOSIS — K5900 Constipation, unspecified: Secondary | ICD-10-CM | POA: Insufficient documentation

## 2015-04-14 NOTE — ED Notes (Signed)
Per EMS: Here by EMS, here from a restaurant, here for constipation, last BM yesterday, d/c'd yesterday from [facility], prescribed zofran and omeprazole, stated "these meds are making me constipated, constipated since this am",  Here for eval. [place] PD wants to be notified prior to pt being d/c'd/ leaving. Pt alert, NAD< calm, interactive, ambulatory into w/r for registration. Pending triage.

## 2015-04-14 NOTE — ED Notes (Signed)
Pt reports constipation r/t Zofran - LBM: 6/17

## 2015-04-15 ENCOUNTER — Emergency Department (HOSPITAL_BASED_OUTPATIENT_CLINIC_OR_DEPARTMENT_OTHER)
Admission: EM | Admit: 2015-04-15 | Discharge: 2015-04-15 | Disposition: A | Discharge status: Home / Self Care | Payer: Self-pay | Attending: Emergency Medicine | Admitting: Emergency Medicine

## 2015-04-15 ENCOUNTER — Encounter (HOSPITAL_BASED_OUTPATIENT_CLINIC_OR_DEPARTMENT_OTHER): Payer: Self-pay

## 2015-04-15 DIAGNOSIS — F23 Brief psychotic disorder: Secondary | ICD-10-CM

## 2015-04-15 DIAGNOSIS — K5900 Constipation, unspecified: Secondary | ICD-10-CM

## 2015-04-15 DIAGNOSIS — F4325 Adjustment disorder with mixed disturbance of emotions and conduct: Secondary | ICD-10-CM | POA: Diagnosis present

## 2015-04-15 LAB — COMPREHENSIVE METABOLIC PANEL
ALBUMIN: 4.6 g/dL (ref 3.5–5.0)
ALT: 25 U/L (ref 14–54)
ANION GAP: 10 (ref 5–15)
AST: 26 U/L (ref 15–41)
Alkaline Phosphatase: 125 U/L (ref 38–126)
BUN: 23 mg/dL — AB (ref 6–20)
CALCIUM: 9.3 mg/dL (ref 8.9–10.3)
CO2: 21 mmol/L — ABNORMAL LOW (ref 22–32)
Chloride: 109 mmol/L (ref 101–111)
Creatinine, Ser: 1.33 mg/dL — ABNORMAL HIGH (ref 0.44–1.00)
GFR calc non Af Amer: 47 mL/min — ABNORMAL LOW (ref >60)
GFR, EST AFRICAN AMERICAN: 54 mL/min — AB (ref >60)
Glucose, Bld: 137 mg/dL — ABNORMAL HIGH (ref 65–99)
Potassium: 4.2 mmol/L (ref 3.5–5.1)
Sodium: 140 mmol/L (ref 135–145)
Total Bilirubin: 0.1 mg/dL — ABNORMAL LOW (ref 0.3–1.2)
Total Protein: 7.8 g/dL (ref 6.5–8.1)

## 2015-04-15 LAB — CBC WITH DIFFERENTIAL/PLATELET
Basophils Absolute: 0 10*3/uL (ref 0.0–0.1)
Basophils Relative: 0 % (ref 0–1)
EOS ABS: 0.2 10*3/uL (ref 0.0–0.7)
EOS PCT: 3 % (ref 0–5)
HEMATOCRIT: 38.4 % (ref 36.0–46.0)
HEMOGLOBIN: 12.3 g/dL (ref 12.0–15.0)
Lymphocytes Relative: 35 % (ref 12–46)
Lymphs Abs: 2.4 10*3/uL (ref 0.7–4.0)
MCH: 25.8 pg — AB (ref 26.0–34.0)
MCHC: 32 g/dL (ref 30.0–36.0)
MCV: 80.7 fL (ref 78.0–100.0)
Monocytes Absolute: 0.4 10*3/uL (ref 0.1–1.0)
Monocytes Relative: 5 % (ref 3–12)
NEUTROS PCT: 57 % (ref 43–77)
Neutro Abs: 3.9 10*3/uL (ref 1.7–7.7)
Platelets: 340 10*3/uL (ref 150–400)
RBC: 4.76 MIL/uL (ref 3.87–5.11)
RDW: 14.7 % (ref 11.5–15.5)
WBC: 6.9 10*3/uL (ref 4.0–10.5)

## 2015-04-15 MED ORDER — DICYCLOMINE HCL 20 MG PO TABS
20.0000 mg | ORAL_TABLET | Freq: Two times a day (BID) | ORAL | Status: DC
  Administered 2015-04-15: 20 mg via ORAL
  Filled 2015-04-15: qty 1

## 2015-04-15 MED ORDER — ONDANSETRON 4 MG PO TBDP: 4.0000 mg | ORAL_TABLET | Freq: Three times a day (TID) | ORAL | Status: DC | PRN

## 2015-04-15 MED ORDER — ZIPRASIDONE MESYLATE 20 MG IM SOLR
20.0000 mg | Freq: Once | INTRAMUSCULAR | Status: AC
  Administered 2015-04-15: 20 mg via INTRAMUSCULAR

## 2015-04-15 MED ORDER — IBUPROFEN 800 MG PO TABS
800.0000 mg | ORAL_TABLET | Freq: Once | ORAL | Status: AC
Start: 2015-04-15 — End: 2015-04-15
  Administered 2015-04-15: 800 mg via ORAL
  Filled 2015-04-15: qty 1

## 2015-04-15 MED ORDER — FLEET ENEMA 7-19 GM/118ML RE ENEM
1.0000 | ENEMA | Freq: Once | RECTAL | Status: DC
  Filled 2015-04-15: qty 1

## 2015-04-15 MED ORDER — ZIPRASIDONE MESYLATE 20 MG IM SOLR
INTRAMUSCULAR | Status: AC
  Administered 2015-04-15: 20 mg via INTRAMUSCULAR
  Filled 2015-04-15: qty 20

## 2015-04-15 MED ORDER — PANTOPRAZOLE SODIUM 40 MG PO TBEC
40.0000 mg | DELAYED_RELEASE_TABLET | Freq: Every day | ORAL | Status: DC
  Administered 2015-04-15: 40 mg via ORAL
  Filled 2015-04-15: qty 1

## 2015-04-15 NOTE — ED Notes (Signed)
Bed: ELY59 Expected date:  Expected time:  Means of arrival:  Comments: [facility]

## 2015-04-15 NOTE — ED Notes (Signed)
Pt seen and dispositioned for d/c prior to RN assessment, see MD notes, orders received and initiated. Fleets given to pt for d/c home. Pt c/o leg cramps, preferring to walk, asking for something for leg cramps. Pt walking EDP notified.

## 2015-04-15 NOTE — [facility] Counselor (Signed)
Contacted EDP to enter TTS consult for patient who is in Psych ED in room 39.

## 2015-04-15 NOTE — ED Notes (Signed)
Pt c/o constipation. Dr. Darleene Cleaver made aware and to proceed to discharge with follow-up PCP. Pt agrees with discharge and follow-up.

## 2015-04-15 NOTE — ED Notes (Signed)
At this time: Alert, NAD, calm, sitting on edge of bed.

## 2015-04-15 NOTE — ED Notes (Signed)
Pt sleeping, arousable to voice, acknowledged plan for TTS, communicating agreeable to plan, attempted TTS, pt unable to maintain conversation, drifts back to sleep, poor concentration, TTS aborted and cancelled for time being, will re-order when pt able, Dr. Stark Jock and [facility] aware, pt back to sleep. VSS.

## 2015-04-15 NOTE — ED Notes (Signed)
Discharge instructions reviewed with patient. Pt request bus pass and states she will be going to a friends home.

## 2015-04-15 NOTE — ED Notes (Signed)
No changes. Resting, sleeping, NAD, calm, rise and fall of chest noted, CBIR, door open, in view of nurses station.

## 2015-04-15 NOTE — Consult Note (Signed)
[facility] Face-to-Face Psychiatry Consult   Reason for Consult:  Behavior issues Referring Physician:  EDP Patient Identification: Annisa Mazzarella MRN:  [id number] Principal Diagnosis: Adjustment disorder with mixed disturbance of emotions and conduct Diagnosis:   Patient Active Problem List   Diagnosis Date Noted  . Adjustment disorder with mixed disturbance of emotions and conduct [F43.25] 04/15/2015    Priority: High  . Severe recurrent major depression without psychotic features [F33.2] 03/12/2015    Priority: High  . Suicidal ideations [R45.851] 03/12/2015    Priority: High  . 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 Diabetes [E11.9] 09/06/2012  . Delusional disorder(297.1) [F22] 04/16/2012    Total Time spent with patient: 45 minutes  Subjective:   Latyra Jaye is a 49 y.o. female patient does not warrant admission.  HPI:  The patient went to [facility] for constipation and was treated but then she started demanding pain medications.  Her behavior escalated until she was given PRN medications for sedation.   Transferred to this ED.   She denies suicidal/homicidal ideations, hallucinations, and alcohol/drug abuse.  Patient calm and cooperative.   HPI Elements:   Location:  generalized. Quality:  acute. Severity:  mild. Timing:  intermittent. Duration:  brief. Context:  constipation.  Past Medical History:  Past Medical History  Diagnosis Date  . Anxiety   . Major depressive disorder, recurrent, severe with psychotic features   . Diabetes mellitus without complication    History reviewed. No pertinent past surgical history. Family History:  Family History  Problem Relation Age of Onset  . Hypertension Other   . Diabetes Other    Social History:  History  Alcohol Use  . Yes     History  Drug Use No    History   Social History  . Marital Status: Single    Spouse Name: N/A  . Number of Children: N/A  . Years of Education: N/A   Social History Main Topics   . Smoking status: Never Smoker   . Smokeless tobacco: Never Used  . Alcohol Use: Yes  . Drug Use: No  . Sexual Activity: Not on file   Other Topics Concern  . None   Social History Narrative   Additional Social History:                          Allergies:  No Known Allergies  Labs:  Results for orders placed or performed during the hospital encounter of 04/15/15 (from the past 48 hour(s))  CBC with Differential/Platelet     Status: Abnormal   Collection Time: 04/15/15  2:35 AM  Result Value Ref Range   WBC 6.9 4.0 - 10.5 K/uL   RBC 4.76 3.87 - 5.11 MIL/uL   Hemoglobin 12.3 12.0 - 15.0 g/dL   HCT 38.4 36.0 - 46.0 %   MCV 80.7 78.0 - 100.0 fL   MCH 25.8 (L) 26.0 - 34.0 pg   MCHC 32.0 30.0 - 36.0 g/dL   RDW 14.7 11.5 - 15.5 %   Platelets 340 150 - 400 K/uL   Neutrophils Relative % 57 43 - 77 %   Neutro Abs 3.9 1.7 - 7.7 K/uL   Lymphocytes Relative 35 12 - 46 %   Lymphs Abs 2.4 0.7 - 4.0 K/uL   Monocytes Relative 5 3 - 12 %   Monocytes Absolute 0.4 0.1 - 1.0 K/uL   Eosinophils Relative 3 0 - 5 %   Eosinophils Absolute 0.2  0.0 - 0.7 K/uL   Basophils Relative 0 0 - 1 %   Basophils Absolute 0.0 0.0 - 0.1 K/uL  Comprehensive metabolic panel     Status: Abnormal   Collection Time: 04/15/15  2:35 AM  Result Value Ref Range   Sodium 140 135 - 145 mmol/L   Potassium 4.2 3.5 - 5.1 mmol/L   Chloride 109 101 - 111 mmol/L   CO2 21 (L) 22 - 32 mmol/L   Glucose, Bld 137 (H) 65 - 99 mg/dL   BUN 23 (H) 6 - 20 mg/dL   Creatinine, Ser 1.33 (H) 0.44 - 1.00 mg/dL   Calcium 9.3 8.9 - 10.3 mg/dL   Total Protein 7.8 6.5 - 8.1 g/dL   Albumin 4.6 3.5 - 5.0 g/dL   AST 26 15 - 41 U/L   ALT 25 14 - 54 U/L   Alkaline Phosphatase 125 38 - 126 U/L   Total Bilirubin 0.1 (L) 0.3 - 1.2 mg/dL   GFR calc non Af Amer 47 (L) >60 mL/min   GFR calc Af Amer 54 (L) >60 mL/min    Comment: (NOTE) The eGFR has been calculated using the CKD EPI equation. This calculation has not been validated  in all clinical situations. eGFR's persistently <60 mL/min signify possible Chronic Kidney Disease.    Anion gap 10 5 - 15    Vitals: Blood pressure 143/92, pulse 91, temperature 97.7 F (36.5 C), temperature source Oral, resp. rate 18, height _0  (1.651 m), weight 114.306 kg (252 lb), last menstrual period 12/25/2009, SpO2 100 %.  Risk to Self: Is patient at risk for suicide?: No Risk to Others:   Prior Inpatient Therapy:   Prior Outpatient Therapy:    Current Facility-Administered Medications  Medication Dose Route Frequency Provider Last Rate Last Dose  . dicyclomine (BENTYL) tablet 20 mg  20 mg Oral BID Milton Ferguson, MD   20 mg at 04/15/15 1050  . ondansetron (ZOFRAN-ODT) disintegrating tablet 4 mg  4 mg Oral Q8H PRN Milton Ferguson, MD      . pantoprazole (PROTONIX) EC tablet 40 mg  40 mg Oral Daily Milton Ferguson, MD   40 mg at 04/15/15 1050  . sodium phosphate (FLEET) 7-19 GM/118ML enema 1 enema  1 enema Rectal Once Veryl Speak, MD   1 enema at 04/15/15 0211   Current Outpatient Prescriptions  Medication Sig Dispense Refill  . dicyclomine (BENTYL) 20 MG tablet Take 1 tablet (20 mg total) by mouth 2 (two) times daily. 20 tablet 0  . omeprazole (PRILOSEC) 20 MG capsule Take 1 capsule (20 mg total) by mouth daily. 20 capsule 0  . Ondansetron (ZOFRAN ODT PO) Take by mouth.      Musculoskeletal: Strength & Muscle Tone: within normal limits Gait & Station: normal Patient leans: N/A  Psychiatric Specialty Exam: Physical Exam  Review of Systems  Constitutional: Negative.   HENT: Negative.   Eyes: Negative.   Respiratory: Negative.   Cardiovascular: Negative.   Gastrointestinal: Negative.   Genitourinary: Negative.   Musculoskeletal: Negative.   Skin: Negative.   Neurological: Negative.   Endo/Heme/Allergies: Negative.   Psychiatric/Behavioral:       Constipation    Blood pressure 143/92, pulse 91, temperature 97.7 F (36.5 C), temperature source Oral, resp. rate  18, height _1  (1.651 m), weight 114.306 kg (252 lb), last menstrual period 12/25/2009, SpO2 100 %.Body mass index is 41.93 kg/(m^2).  General Appearance: Casual  Eye Contact::  Good  Speech:  Normal Rate  Volume:  Normal  Mood:  Irritable  Affect:  Congruent  Thought Process:  Coherent  Orientation:  Full (Time, Place, and Person)  Thought Content:  WDL  Suicidal Thoughts:  No  Homicidal Thoughts:  No  Memory:  Immediate;   Good Recent;   Good Remote;   Good  Judgement:  Fair  Insight:  Good  Psychomotor Activity:  Normal  Concentration:  Good  Recall:  Good  Fund of Knowledge:Good  Language: Good  Akathisia:  No  Handed:  Right  AIMS (if indicated):     Assets:  Leisure Time Physical Health Resilience Social Support  ADL's:  Intact  Cognition: WNL  Sleep:      Medical Decision Making: Review of Psycho-Social Stressors (1), Review or order clinical lab tests (1) and Review of Medication Regimen & Side Effects (2)  Treatment Plan Summary: Daily contact with patient to assess and evaluate symptoms and progress in treatment, Medication management and Plan discharge home and follow-up with her regular providers  Plan:  No evidence of imminent risk to self or others at present.   Disposition: discharge home and follow-up with her regular providers  Waylan Boga, [place] 04/15/2015 2:30 PM Patient seen face-to-face for psychiatric evaluation, chart reviewed and case discussed with the physician extender and developed treatment plan. Reviewed the information documented and agree with the treatment plan. Corena Pilgrim, MD

## 2015-04-15 NOTE — ED Notes (Signed)
Pt alert and cooperative. -SI/HI/A/V/H. Continue to c/o constipation,denies abdominal pain. Will continue to monitor closely and evaluate for stabilization.

## 2015-04-15 NOTE — ED Notes (Signed)
"  Attempting to lie down" (done), "spasms are gone for now", aware of need for urine and agreeable, but "unable at this time", given water per request (done), also prefers lights bright and door open (done).

## 2015-04-15 NOTE — ED Notes (Signed)
Pt discharged. All belongings returned to patient. Staff escorted pt to lobby.

## 2015-04-15 NOTE — ED Notes (Signed)
PTAR here, pt arousable to voice, verbalizes understanding of where she is going and why. VSS. sBP 90s while pt sleeping on L side.

## 2015-04-15 NOTE — ED Notes (Signed)
[facility] unable to transport. PTAR contacted.

## 2015-04-15 NOTE — ED Notes (Signed)
Calmer, cooperative, polite, apologetic, attempted urine sample, ambulatory with RN to b/r, steady gait (unable), tolerated and cooperated with IM injection and lab draw. Sitting in chair at this time.

## 2015-04-15 NOTE — ED Notes (Signed)
Patient brought in by EMS from [facility]. Patient came in sleeping probably from the effect of Geodon given @ 2:30am. Breathing even and unlabored. Will continue to monitor patient.

## 2015-04-15 NOTE — ED Notes (Addendum)
Out with PTAR, VSS, verbalizes understanding, belongings with pt. [place] PD notified. Pt refused to sign transfer form. PTAR present/ witnessed.

## 2015-04-15 NOTE — [facility] Suicide Risk Assessment (Signed)
Suicide Risk Assessment  Discharge Assessment   [facility] Discharge Suicide Risk Assessment   Demographic Factors:  NA  Total Time spent with patient: 45 minutes  Musculoskeletal: Strength & Muscle Tone: within normal limits Gait & Station: normal Patient leans: N/A  Psychiatric Specialty Exam: Physical Exam  Review of Systems  Constitutional: Negative.   HENT: Negative.   Eyes: Negative.   Respiratory: Negative.   Cardiovascular: Negative.   Gastrointestinal: Negative.   Genitourinary: Negative.   Musculoskeletal: Negative.   Skin: Negative.   Neurological: Negative.   Endo/Heme/Allergies: Negative.   Psychiatric/Behavioral:       Constipation    Blood pressure 143/92, pulse 91, temperature 97.7 F (36.5 C), temperature source Oral, resp. rate 18, height 5\' 5"  (1.651 m), weight 114.306 kg (252 lb), last menstrual period 12/25/2009, SpO2 100 %.Body mass index is 41.93 kg/(m^2).  General Appearance: Casual  Eye Contact::  Good  Speech:  Normal Rate  Volume:  Normal  Mood:  Irritable  Affect:  Congruent  Thought Process:  Coherent  Orientation:  Full (Time, Place, and Person)  Thought Content:  WDL  Suicidal Thoughts:  No  Homicidal Thoughts:  No  Memory:  Immediate;   Good Recent;   Good Remote;   Good  Judgement:  Fair  Insight:  Good  Psychomotor Activity:  Normal  Concentration:  Good  Recall:  Good  Fund of Knowledge:Good  Language: Good  Akathisia:  No  Handed:  Right  AIMS (if indicated):     Assets:  Leisure Time Physical Health Resilience Social Support  ADL's:  Intact  Cognition: WNL  Sleep:          Has this patient used any form of tobacco in the last 30 days? (Cigarettes, Smokeless Tobacco, Cigars, and/or Pipes) No  Mental Status Per Nursing Assessment::   On Admission:   Behavior issues  Current Mental Status by Physician: NA  Loss Factors: NA  Historical Factors: NA  Risk Reduction Factors:   Sense of responsibility to family,  Living with another person, especially a relative, Positive social support and Positive therapeutic relationship  Continued Clinical Symptoms:  Mild irritability  Cognitive Features That Contribute To Risk:  None    Suicide Risk:  Minimal: No identifiable suicidal ideation.  Patients presenting with no risk factors but with morbid ruminations; may be classified as minimal risk based on the severity of the depressive symptoms  Principal Problem: Adjustment disorder with mixed disturbance of emotions and conduct Discharge Diagnoses:  Patient Active Problem List   Diagnosis Date Noted  . Adjustment disorder with mixed disturbance of emotions and conduct [F43.25] 04/15/2015    Priority: High  . Severe recurrent major depression without psychotic features [F33.2] 03/12/2015    Priority: High  . Suicidal ideations [R45.851] 03/12/2015    Priority: High  . 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 Diabetes [E11.9] 09/06/2012  . Delusional disorder(297.1) [F22] 04/16/2012    Follow-up Information    Follow up with [facility].   Specialty:  Emergency Medicine   Why:  As needed, If symptoms worsen   Contact information:   [address] [id number] [place] [phone]      Plan Of Care/Follow-up recommendations:  Activity:  as tolerated Diet:  heart healthy diet  Is patient on multiple antipsychotic therapies at discharge:  No   Has Patient had three or more failed trials of antipsychotic monotherapy by history:  No  Recommended Plan for  Multiple Antipsychotic Therapies: NA    Sabriel Borromeo, PMH-NP 04/15/2015, 2:39 PM

## 2015-04-15 NOTE — ED Notes (Addendum)
No changes. Pt sedated, unable to keep conversation. VSS. Pending PTAR for transport. Pt made aware. Acknowledged by pt.

## 2015-04-15 NOTE — Discharge Instructions (Signed)
Fleets enema as needed for relief of constipation.   Constipation Constipation is when a person has fewer than three bowel movements a week, has difficulty having a bowel movement, or has stools that are dry, hard, or larger than normal. As people grow older, constipation is more common. If you try to fix constipation with medicines that make you have a bowel movement (laxatives), the problem may get worse. Long-term laxative use may cause the muscles of the colon to become weak. A low-fiber diet, not taking in enough fluids, and taking certain medicines may make constipation worse.  CAUSES   Certain medicines, such as antidepressants, pain medicine, iron supplements, antacids, and water pills.   Certain diseases, such as diabetes, irritable bowel syndrome (IBS), thyroid disease, or depression.   Not drinking enough water.   Not eating enough fiber-rich foods.   Stress or travel.   Lack of physical activity or exercise.   Ignoring the urge to have a bowel movement.   Using laxatives too much.  SIGNS AND SYMPTOMS   Having fewer than three bowel movements a week.   Straining to have a bowel movement.   Having stools that are hard, dry, or larger than normal.   Feeling full or bloated.   Pain in the lower abdomen.   Not feeling relief after having a bowel movement.  DIAGNOSIS  Your health care provider will take a medical history and perform a physical exam. Further testing may be done for severe constipation. Some tests may include:  A barium enema X-ray to examine your rectum, colon, and, sometimes, your small intestine.   A sigmoidoscopy to examine your lower colon.   A colonoscopy to examine your entire colon. TREATMENT  Treatment will depend on the severity of your constipation and what is causing it. Some dietary treatments include drinking more fluids and eating more fiber-rich foods. Lifestyle treatments may include regular exercise. If these diet and  lifestyle recommendations do not help, your health care provider may recommend taking over-the-counter laxative medicines to help you have bowel movements. Prescription medicines may be prescribed if over-the-counter medicines do not work.  HOME CARE INSTRUCTIONS   Eat foods that have a lot of fiber, such as fruits, vegetables, whole grains, and beans.  Limit foods high in fat and processed sugars, such as french fries, hamburgers, cookies, candies, and soda.   A fiber supplement may be added to your diet if you cannot get enough fiber from foods.   Drink enough fluids to keep your urine clear or pale yellow.   Exercise regularly or as directed by your health care provider.   Go to the restroom when you have the urge to go. Do not hold it.   Only take over-the-counter or prescription medicines as directed by your health care provider. Do not take other medicines for constipation without talking to your health care provider first.  [place] IF:   You have bright red blood in your stool.   Your constipation lasts for more than 4 days or gets worse.   You have abdominal or rectal pain.   You have thin, pencil-like stools.   You have unexplained weight loss. MAKE SURE YOU:   Understand these instructions.  Will watch your condition.  Will get help right away if you are not doing well or get worse. Document Released: 07/11/2004 Document Revised: 10/18/2013 Document Reviewed: 07/25/2013 [facility] Patient Information 2015 [place], [place]. This information is not intended to replace advice given to you by  your health care provider. Make sure you discuss any questions you have with your health care provider. ° °

## 2015-04-15 NOTE — ED Notes (Addendum)
Pt yelling, cursing, demanding pain medicine. C/o wait. Cursing at doctor and nurse. Plan, meds  and boundaries explained. Security and [employer] called to room. Pt accepted ibuprofen. Now yelling moaning and crying d/t leg cramps.  Resposne appears to be be behavioral and emotional and not physical.  EDP present and aware. BLE assessed as normal. pedal pulses palpable. BLE warm. BLE w/o pulling, twisting, turning, rigidity. BLE calves soft and supple. Pt response inconsistent with assessment.

## 2015-04-15 NOTE — BH Assessment (Signed)
Received notification of TTS consult request. Spoke to Dr. Veryl Speak who said Pt became agitated in ED and appears psychotic. Tele-assessment will be initiated.  Orpah Greek Anson Fret, [place], [facility], [facility] Triage Specialist [phone]

## 2015-04-15 NOTE — ED Provider Notes (Signed)
CSN: [id number]     Arrival date & time 04/14/15  2254 History   First MD Initiated Contact with Patient 04/15/15 0109     Chief Complaint  Patient presents with  . Constipation     (Consider location/radiation/quality/duration/timing/severity/associated sxs/prior Treatment) HPI Comments: Patient is a 49 year old female with history of morbid obesity, diabetes, and psychosis. She presents for evaluation of constipation/fecal impaction. She reports having stool in her rectum which she cannot push out. She states that this is painful and is worried that she "might tear".  Patient is a 49 y.o. female presenting with constipation. The history is provided by the patient.  Constipation Severity:  Moderate Time since last bowel movement:  12 hours Timing:  Constant Progression:  Worsening Chronicity:  New Stool description:  None produced Relieved by:  Nothing Worsened by:  Nothing tried Ineffective treatments:  None tried   Past Medical History  Diagnosis Date  . Anxiety   . Major depressive disorder, recurrent, severe with psychotic features   . Diabetes mellitus without complication    History reviewed. No pertinent past surgical history. Family History  Problem Relation Age of Onset  . Hypertension Other   . Diabetes Other    History  Substance Use Topics  . Smoking status: Never Smoker   . Smokeless tobacco: Never Used  . Alcohol Use: Yes   OB History    No data available     Review of Systems  Gastrointestinal: Positive for constipation.  All other systems reviewed and are negative.     Allergies  Review of patient's allergies indicates no known allergies.  Home Medications   Prior to Admission medications   Medication Sig Start Date End Date Taking? Authorizing Provider  dicyclomine (BENTYL) 20 MG tablet Take 1 tablet (20 mg total) by mouth 2 (two) times daily. 04/12/15  Yes Comer Locket, PA-C  omeprazole (PRILOSEC) 20 MG capsule Take 1 capsule (20 mg  total) by mouth daily. 04/12/15  Yes Benjamin Cartner, PA-C  Ondansetron (ZOFRAN ODT PO) Take by mouth.   Yes Historical Provider, MD   BP 131/97 mmHg  Pulse 105  Temp(Src) 98 F (36.7 C) (Oral)  Resp 16  Ht 5\' 5"  (1.651 m)  Wt 252 lb (114.306 kg)  BMI 41.93 kg/m2  SpO2 98%  LMP 12/25/2009 Physical Exam  Constitutional: She is oriented to person, place, and time. She appears well-developed and well-nourished. No distress.  HENT:  Head: Normocephalic and atraumatic.  Neck: Normal range of motion. Neck supple.  Cardiovascular: Normal rate and regular rhythm.  Exam reveals no gallop and no friction rub.   No murmur heard. Pulmonary/Chest: Effort normal and breath sounds normal. No respiratory distress. She has no wheezes.  Abdominal: Soft. Bowel sounds are normal. She exhibits no distension. There is no tenderness.  Genitourinary:  There is a fecal impaction present.  Musculoskeletal: Normal range of motion.  Neurological: She is alert and oriented to person, place, and time.  Skin: Skin is warm and dry. She is not diaphoretic.  Nursing note and vitals reviewed.   ED Course  Procedures (including critical care time) Labs Review Labs Reviewed - No data to display  Imaging Review No results found.   EKG Interpretation None      MDM   Final diagnoses:  None    Patient presented here with complaints of constipation. Shortly after she was evaluated, she became agitated and began screaming and carrying on conversations in her room with people who were not there.  She was screaming and insisting that her toes were crooked and that her legs were falling off. We attempted to calm her down, however these attempts were unsuccessful. At one point she left her exam room and began walking the halls screaming and threatening the nursing staff. For this reason, she was given an IM dose of Geodon and is now resting comfortably. Upon reading her chart, she has a history of psychiatric  issues. She has been on medications in the past, however I do not see where she is on anything at present.   I feel as though the patient will require psychiatric evaluation and have consulted TTS for this. The patient was too drowsy from her Geodon for discussion with TTS. As this appears to be a lengthy process, I've decided to transfer the patient to a more appropriate facility. I have spoken with Dr. Colin Rhein from [facility] who agrees to accept the patient in transfer.    Veryl Speak, MD 04/15/15 [phone]

## 2015-04-15 NOTE — BH Assessment (Signed)
Per [name], RN Pt has been given Geodon. Attempted tele-assessment but Pt is too drowsy to respond to questions. Tele-assessment will be completed when Pt is able to participate.  [name], [place], [facility], [facility] Triage Specialist [phone]

## 2015-04-15 NOTE — ED Notes (Addendum)
Resting, sleeping, NAD, calm, rise and fall of chest noted, CBIR, door open, in view of nurses station.

## 2015-04-16 ENCOUNTER — Encounter (HOSPITAL_COMMUNITY): Payer: Self-pay | Admitting: Emergency Medicine

## 2015-04-16 ENCOUNTER — Emergency Department (HOSPITAL_COMMUNITY)
Admission: EM | Admit: 2015-04-16 | Discharge: 2015-04-16 | Disposition: A | Discharge status: Home / Self Care | Payer: No Typology Code available for payment source | Attending: Emergency Medicine | Admitting: Emergency Medicine

## 2015-04-16 DIAGNOSIS — K5900 Constipation, unspecified: Secondary | ICD-10-CM | POA: Insufficient documentation

## 2015-04-16 DIAGNOSIS — E119 Type 2 diabetes mellitus without complications: Secondary | ICD-10-CM | POA: Insufficient documentation

## 2015-04-16 DIAGNOSIS — F1310 Sedative, hypnotic or anxiolytic abuse, uncomplicated: Secondary | ICD-10-CM | POA: Insufficient documentation

## 2015-04-16 DIAGNOSIS — Z8659 Personal history of other mental and behavioral disorders: Secondary | ICD-10-CM | POA: Insufficient documentation

## 2015-04-16 LAB — RAPID URINE DRUG SCREEN, HOSP PERFORMED
Amphetamines: NOT DETECTED
Barbiturates: POSITIVE — AB
Benzodiazepines: POSITIVE — AB
Cocaine: NOT DETECTED
Opiates: NOT DETECTED
Tetrahydrocannabinol: NOT DETECTED

## 2015-04-16 LAB — COMPREHENSIVE METABOLIC PANEL
ALBUMIN: 4.6 g/dL (ref 3.5–5.0)
ALK PHOS: 123 U/L (ref 38–126)
ALT: 24 U/L (ref 14–54)
ANION GAP: 11 (ref 5–15)
AST: 27 U/L (ref 15–41)
BUN: 25 mg/dL — AB (ref 6–20)
CHLORIDE: 111 mmol/L (ref 101–111)
CO2: 21 mmol/L — ABNORMAL LOW (ref 22–32)
Calcium: 9.5 mg/dL (ref 8.9–10.3)
Creatinine, Ser: 1.06 mg/dL — ABNORMAL HIGH (ref 0.44–1.00)
GFR calc Af Amer: >60 mL/min (ref >60)
GFR calc non Af Amer: >60 mL/min (ref >60)
Glucose, Bld: 129 mg/dL — ABNORMAL HIGH (ref 65–99)
Potassium: 4 mmol/L (ref 3.5–5.1)
SODIUM: 143 mmol/L (ref 135–145)
TOTAL PROTEIN: 8.2 g/dL — AB (ref 6.5–8.1)
Total Bilirubin: 0.1 mg/dL — ABNORMAL LOW (ref 0.3–1.2)

## 2015-04-16 LAB — ETHANOL: Alcohol, Ethyl (B): <5 mg/dL (ref <5)

## 2015-04-16 LAB — CBC
HCT: 39.5 % (ref 36.0–46.0)
Hemoglobin: 12.6 g/dL (ref 12.0–15.0)
MCH: 26 pg (ref 26.0–34.0)
MCHC: 31.9 g/dL (ref 30.0–36.0)
MCV: 81.6 fL (ref 78.0–100.0)
Platelets: 347 10*3/uL (ref 150–400)
RBC: 4.84 MIL/uL (ref 3.87–5.11)
RDW: 14.4 % (ref 11.5–15.5)
WBC: 6.7 10*3/uL (ref 4.0–10.5)

## 2015-04-16 LAB — ACETAMINOPHEN LEVEL: Acetaminophen (Tylenol), Serum: <10 ug/mL — ABNORMAL LOW (ref 10–30)

## 2015-04-16 LAB — SALICYLATE LEVEL
Salicylate Lvl: 11.9 mg/dL (ref 2.8–30.0)
Salicylate Lvl: 11.5 mg/dL (ref 2.8–30.0)

## 2015-04-16 MED ORDER — POLYETHYLENE GLYCOL 3350 17 G PO PACK: 17.0000 g | PACK | Freq: Every day | ORAL | Status: DC

## 2015-04-16 NOTE — Discharge Instructions (Signed)

## 2015-04-16 NOTE — ED Provider Notes (Signed)
CSN: [id number]     Arrival date & time 04/16/15  0012 History   First MD Initiated Contact with Patient 04/16/15 0115     Chief Complaint  Patient presents with  . Constipation     (Consider location/radiation/quality/duration/timing/severity/associated sxs/prior Treatment) Patient is a 49 y.o. female presenting with constipation. The history is provided by the patient. No language interpreter was used.  Constipation Severity:  Moderate Timing:  Constant Associated symptoms: no fever, no nausea and no vomiting   Associated symptoms comment:  She is here for evaluation of constipation. She cannot define how long this has been a problems. Shew as seen at [facility] early yesterday morning for same and became psychiatrically unstable. She was transferred to [facility] ED for psychiatric evaluation which was done. She was discharged home. She returns tonight with symptoms of persistent constipation despite taking a "whole bottle of Pepto Bismol." No vomiting. No blood per rectum.   Past Medical History  Diagnosis Date  . Anxiety   . Major depressive disorder, recurrent, severe with psychotic features   . Diabetes mellitus without complication    History reviewed. No pertinent past surgical history. Family History  Problem Relation Age of Onset  . Hypertension Other   . Diabetes Other    History  Substance Use Topics  . Smoking status: Never Smoker   . Smokeless tobacco: Never Used  . Alcohol Use: Yes   OB History    No data available     Review of Systems  Constitutional: Negative for fever and chills.  Gastrointestinal: Positive for constipation. Negative for nausea and vomiting.  Genitourinary: Negative.   Musculoskeletal: Negative.  Negative for myalgias.  Skin: Negative.   Neurological: Negative.       Allergies  Review of patient's allergies indicates no known allergies.  Home Medications   Prior to Admission medications   Medication Sig Start Date End  Date Taking? Authorizing Provider  bismuth subsalicylate (PEPTO BISMOL) 262 MG/15ML suspension Take 30 mLs by mouth every 6 (six) hours as needed for indigestion.   Yes Historical Provider, MD  dicyclomine (BENTYL) 20 MG tablet Take 1 tablet (20 mg total) by mouth 2 (two) times daily. 04/12/15  Yes Comer Locket, PA-C  omeprazole (PRILOSEC) 20 MG capsule Take 1 capsule (20 mg total) by mouth daily. 04/12/15  Yes Benjamin Cartner, PA-C  ondansetron (ZOFRAN-ODT) 4 MG disintegrating tablet Take 4 mg by mouth every 8 (eight) hours as needed for nausea or vomiting.   Yes Historical Provider, MD  polyethylene glycol (MIRALAX) packet Take 17 g by mouth daily. 04/16/15   Justn Quale, PA-C   BP 124/70 mmHg  Pulse 110  Temp(Src) 98.7 F (37.1 C) (Oral)  Resp 18  SpO2 100%  LMP 12/25/2009 Physical Exam  Constitutional: She is oriented to person, place, and time. She appears well-developed and well-nourished.  HENT:  Head: Normocephalic.  Neck: Normal range of motion. Neck supple.  Cardiovascular: Normal rate.   Pulmonary/Chest: Effort normal.  Abdominal: Soft. Bowel sounds are normal. There is no tenderness. There is no rebound and no guarding.  Musculoskeletal: Normal range of motion.  Neurological: She is alert and oriented to person, place, and time.  Skin: Skin is warm and dry. No rash noted.  Psychiatric: Her affect is blunt. Her speech is rapid and/or pressured. She expresses impulsivity.  Demonstrates 'flight of ideas', unable to stay on topic, requiring much redirection. When directed she is able to answer questions directly.    ED Course  Procedures (including critical care time) Labs Review Labs Reviewed  ACETAMINOPHEN LEVEL - Abnormal; Notable for the following:    Acetaminophen (Tylenol), Serum <10 (*)    All other components within normal limits  COMPREHENSIVE METABOLIC PANEL - Abnormal; Notable for the following:    CO2 21 (*)    Glucose, Bld 129 (*)    BUN 25 (*)     Creatinine, Ser 1.06 (*)    Total Protein 8.2 (*)    Total Bilirubin 0.1 (*)    All other components within normal limits  URINE RAPID DRUG SCREEN, HOSP PERFORMED - Abnormal; Notable for the following:    Benzodiazepines POSITIVE (*)    Barbiturates POSITIVE (*)    All other components within normal limits  CBC  ETHANOL  SALICYLATE LEVEL  SALICYLATE LEVEL   Results for orders placed or performed during the hospital encounter of 04/16/15  Acetaminophen level  Result Value Ref Range   Acetaminophen (Tylenol), Serum <10 (L) 10 - 30 ug/mL  CBC  Result Value Ref Range   WBC 6.7 4.0 - 10.5 K/uL   RBC 4.84 3.87 - 5.11 MIL/uL   Hemoglobin 12.6 12.0 - 15.0 g/dL   HCT 39.5 36.0 - 46.0 %   MCV 81.6 78.0 - 100.0 fL   MCH 26.0 26.0 - 34.0 pg   MCHC 31.9 30.0 - 36.0 g/dL   RDW 14.4 11.5 - 15.5 %   Platelets 347 150 - 400 K/uL  Comprehensive metabolic panel  Result Value Ref Range   Sodium 143 135 - 145 mmol/L   Potassium 4.0 3.5 - 5.1 mmol/L   Chloride 111 101 - 111 mmol/L   CO2 21 (L) 22 - 32 mmol/L   Glucose, Bld 129 (H) 65 - 99 mg/dL   BUN 25 (H) 6 - 20 mg/dL   Creatinine, Ser 1.06 (H) 0.44 - 1.00 mg/dL   Calcium 9.5 8.9 - 10.3 mg/dL   Total Protein 8.2 (H) 6.5 - 8.1 g/dL   Albumin 4.6 3.5 - 5.0 g/dL   AST 27 15 - 41 U/L   ALT 24 14 - 54 U/L   Alkaline Phosphatase 123 38 - 126 U/L   Total Bilirubin 0.1 (L) 0.3 - 1.2 mg/dL   GFR calc non Af Amer >60 >60 mL/min   GFR calc Af Amer >60 >60 mL/min   Anion gap 11 5 - 15  Ethanol (ETOH)  Result Value Ref Range   Alcohol, Ethyl (B) <5 <5 mg/dL  Salicylate level  Result Value Ref Range   Salicylate Lvl 44.8 2.8 - 30.0 mg/dL  Urine rapid drug screen (hosp performed)not at [facility]  Result Value Ref Range   Opiates NONE DETECTED NONE DETECTED   Cocaine NONE DETECTED NONE DETECTED   Benzodiazepines POSITIVE (A) NONE DETECTED   Amphetamines NONE DETECTED NONE DETECTED   Tetrahydrocannabinol NONE DETECTED NONE DETECTED   Barbiturates  POSITIVE (A) NONE DETECTED  Salicylate level  Result Value Ref Range   Salicylate Lvl 18.5 2.8 - 30.0 mg/dL    Imaging Review No results found.   EKG Interpretation None      MDM   Final diagnoses:  Constipation, unspecified constipation type    1. Constipation  The patient has psychiatric conditions that were fully assessed this morning and were not addressed this evening on return visit to ED with c/o constipation.  Per recommendation of poison control, repeat salicylate level is trending down. She is stable for discharge with home medications for constipation.  She  is apparently wanted for outstanding warrants and police will be made aware of discharge plans.    Charlann Lange, PA-C 04/16/15 0345  Serita Grit, MD 04/16/15 [phone]

## 2015-04-16 NOTE — ED Notes (Addendum)
Spoke to [place] with [employer] she recommends salicylate level, EKG , and acetaminophen level and repeat salicylate level in 2-3 hours if initial level is elevated. Per Poison control ingested 2,049mg  of Salicylate. Ebony Hail reports due to weight of patient this amount should not be harmful. Ebony Hail will call back to check status of labs.

## 2015-04-16 NOTE — ED Notes (Signed)
Pt immediately taken in to custody by [employer] after d/c for outstanding warrants.

## 2015-04-16 NOTE — ED Notes (Signed)
Pt very loud speaking to staff at desk. Pt states she is being picked on because she is black. Pt then states she wants something done about the stains in her pants

## 2015-04-16 NOTE — ED Notes (Addendum)
Pt from [facility] via [employer] (pt is voluntary) c/o constipation. When asked when her last BM was she states " I don't know it won't come out". Pt reports that she drank a whole bottle Pepto Bismal "trying to get it out" She reports [facility] and asked them to give her an enema but she reports "they wouldn't". Pt made aware that those can be purchased over the counter future. Pt then states she has  Not had a laxative for 8 years. When asked if she is suicidal or homicidal  She will not directly answer question. She refers back to the police wanting  to arrest her for missing a court date and states  "they won't stop these people from tryng to run me out of town". Pt reports that she is upset about her they (the police) treat her.

## 2015-05-30 ENCOUNTER — Observation Stay (HOSPITAL_COMMUNITY): Payer: Self-pay

## 2015-05-30 ENCOUNTER — Encounter (HOSPITAL_COMMUNITY): Payer: Self-pay | Admitting: Emergency Medicine

## 2015-05-30 ENCOUNTER — Inpatient Hospital Stay (HOSPITAL_COMMUNITY)
Admission: EM | Admit: 2015-05-30 | Discharge: 2015-05-31 | DRG: 389 | Disposition: A | Discharge status: Home / Self Care | Payer: Self-pay | Attending: Internal Medicine | Admitting: Internal Medicine

## 2015-05-30 DIAGNOSIS — F333 Major depressive disorder, recurrent, severe with psychotic symptoms: Secondary | ICD-10-CM | POA: Diagnosis present

## 2015-05-30 DIAGNOSIS — E440 Moderate protein-calorie malnutrition: Secondary | ICD-10-CM | POA: Insufficient documentation

## 2015-05-30 DIAGNOSIS — F419 Anxiety disorder, unspecified: Secondary | ICD-10-CM | POA: Diagnosis present

## 2015-05-30 DIAGNOSIS — F4325 Adjustment disorder with mixed disturbance of emotions and conduct: Secondary | ICD-10-CM | POA: Diagnosis present

## 2015-05-30 DIAGNOSIS — F22 Delusional disorders: Secondary | ICD-10-CM | POA: Diagnosis present

## 2015-05-30 DIAGNOSIS — Z833 Family history of diabetes mellitus: Secondary | ICD-10-CM

## 2015-05-30 DIAGNOSIS — K5641 Fecal impaction: Principal | ICD-10-CM | POA: Diagnosis present

## 2015-05-30 DIAGNOSIS — K5900 Constipation, unspecified: Secondary | ICD-10-CM | POA: Diagnosis present

## 2015-05-30 DIAGNOSIS — I4581 Long QT syndrome: Secondary | ICD-10-CM | POA: Diagnosis present

## 2015-05-30 DIAGNOSIS — E860 Dehydration: Secondary | ICD-10-CM | POA: Diagnosis present

## 2015-05-30 DIAGNOSIS — Z8249 Family history of ischemic heart disease and other diseases of the circulatory system: Secondary | ICD-10-CM

## 2015-05-30 DIAGNOSIS — R9431 Abnormal electrocardiogram [ECG] [EKG]: Secondary | ICD-10-CM | POA: Diagnosis present

## 2015-05-30 DIAGNOSIS — Z9111 Patient's noncompliance with dietary regimen: Secondary | ICD-10-CM | POA: Diagnosis present

## 2015-05-30 DIAGNOSIS — E876 Hypokalemia: Secondary | ICD-10-CM | POA: Diagnosis present

## 2015-05-30 DIAGNOSIS — I10 Essential (primary) hypertension: Secondary | ICD-10-CM | POA: Diagnosis present

## 2015-05-30 DIAGNOSIS — E119 Type 2 diabetes mellitus without complications: Secondary | ICD-10-CM

## 2015-05-30 LAB — BASIC METABOLIC PANEL WITH GFR
Anion gap: 15 (ref 5–15)
BUN: 15 mg/dL (ref 6–20)
CO2: 24 mmol/L (ref 22–32)
Calcium: 9.5 mg/dL (ref 8.9–10.3)
Chloride: 100 mmol/L — ABNORMAL LOW (ref 101–111)
Creatinine, Ser: 1 mg/dL (ref 0.44–1.00)
GFR calc Af Amer: >60 mL/min
GFR calc non Af Amer: >60 mL/min
Glucose, Bld: 145 mg/dL — ABNORMAL HIGH (ref 65–99)
Potassium: 2.4 mmol/L — CL (ref 3.5–5.1)
Sodium: 139 mmol/L (ref 135–145)

## 2015-05-30 LAB — URINE MICROSCOPIC-ADD ON

## 2015-05-30 LAB — URINALYSIS, ROUTINE W REFLEX MICROSCOPIC
Glucose, UA: NEGATIVE mg/dL
Hgb urine dipstick: NEGATIVE
Ketones, ur: >80 mg/dL — AB
Nitrite: NEGATIVE
Protein, ur: 100 mg/dL — AB
Specific Gravity, Urine: 1.035 — ABNORMAL HIGH (ref 1.005–1.030)
Urobilinogen, UA: 1 mg/dL (ref 0.0–1.0)
pH: 6 (ref 5.0–8.0)

## 2015-05-30 LAB — CBC WITH DIFFERENTIAL/PLATELET
Basophils Absolute: 0 10*3/uL (ref 0.0–0.1)
Basophils Relative: 0 % (ref 0–1)
Eosinophils Absolute: 0.1 10*3/uL (ref 0.0–0.7)
Eosinophils Relative: 1 % (ref 0–5)
HEMATOCRIT: 41.5 % (ref 36.0–46.0)
Hemoglobin: 13.6 g/dL (ref 12.0–15.0)
LYMPHS PCT: 23 % (ref 12–46)
Lymphs Abs: 1.4 10*3/uL (ref 0.7–4.0)
MCH: 25.5 pg — ABNORMAL LOW (ref 26.0–34.0)
MCHC: 32.8 g/dL (ref 30.0–36.0)
MCV: 77.9 fL — ABNORMAL LOW (ref 78.0–100.0)
MONO ABS: 0.3 10*3/uL (ref 0.1–1.0)
MONOS PCT: 5 % (ref 3–12)
Neutro Abs: 4.3 10*3/uL (ref 1.7–7.7)
Neutrophils Relative %: 71 % (ref 43–77)
PLATELETS: 256 10*3/uL (ref 150–400)
RBC: 5.33 MIL/uL — AB (ref 3.87–5.11)
RDW: 14.7 % (ref 11.5–15.5)
WBC: 6 10*3/uL (ref 4.0–10.5)

## 2015-05-30 LAB — HEPATIC FUNCTION PANEL
ALK PHOS: 95 U/L (ref 38–126)
ALT: 24 U/L (ref 14–54)
AST: 25 U/L (ref 15–41)
Albumin: 4.4 g/dL (ref 3.5–5.0)
BILIRUBIN TOTAL: 1 mg/dL (ref 0.3–1.2)
Bilirubin, Direct: 0.2 mg/dL (ref 0.1–0.5)
Indirect Bilirubin: 0.8 mg/dL (ref 0.3–0.9)
Total Protein: 8.1 g/dL (ref 6.5–8.1)

## 2015-05-30 LAB — PHOSPHORUS: Phosphorus: 2.5 mg/dL (ref 2.5–4.6)

## 2015-05-30 LAB — POC URINE PREG, ED: Preg Test, Ur: NEGATIVE

## 2015-05-30 LAB — MAGNESIUM: Magnesium: 2 mg/dL (ref 1.7–2.4)

## 2015-05-30 MED ORDER — FLEET ENEMA 7-19 GM/118ML RE ENEM: 1.0000 | ENEMA | Freq: Once | RECTAL | Status: AC | PRN

## 2015-05-30 MED ORDER — INSULIN ASPART 100 UNIT/ML ~~LOC~~ SOLN
0.0000 [IU] | Freq: Three times a day (TID) | SUBCUTANEOUS | Status: DC
  Administered 2015-05-31 (×2): 1 [IU] via SUBCUTANEOUS

## 2015-05-30 MED ORDER — POLYETHYLENE GLYCOL 3350 17 G PO PACK: 17.0000 g | PACK | Freq: Every day | ORAL | Status: DC | PRN

## 2015-05-30 MED ORDER — SENNA 8.6 MG PO TABS
1.0000 | ORAL_TABLET | Freq: Two times a day (BID) | ORAL | Status: DC
  Administered 2015-05-31 (×2): 8.6 mg via ORAL
  Filled 2015-05-30 (×2): qty 1

## 2015-05-30 MED ORDER — ONDANSETRON HCL 4 MG/2ML IJ SOLN: 4.0000 mg | Freq: Four times a day (QID) | INTRAMUSCULAR | Status: DC | PRN

## 2015-05-30 MED ORDER — MAGNESIUM SULFATE 2 GM/50ML IV SOLN
2.0000 g | Freq: Once | INTRAVENOUS | Status: DC
  Administered 2015-05-30: 2 g via INTRAVENOUS
  Filled 2015-05-30: qty 50

## 2015-05-30 MED ORDER — THIAMINE HCL 100 MG/ML IJ SOLN
100.0000 mg | Freq: Once | INTRAMUSCULAR | Status: AC
  Administered 2015-05-31: 100 mg via INTRAVENOUS
  Filled 2015-05-30: qty 1

## 2015-05-30 MED ORDER — ACETAMINOPHEN 325 MG PO TABS: 650.0000 mg | ORAL_TABLET | Freq: Four times a day (QID) | ORAL | Status: DC | PRN

## 2015-05-30 MED ORDER — ADULT MULTIVITAMIN W/MINERALS CH
1.0000 | ORAL_TABLET | Freq: Every day | ORAL | Status: DC
  Administered 2015-05-31: 1 via ORAL
  Filled 2015-05-30: qty 1

## 2015-05-30 MED ORDER — ENOXAPARIN SODIUM 40 MG/0.4ML ~~LOC~~ SOLN
40.0000 mg | Freq: Every day | SUBCUTANEOUS | Status: DC
  Administered 2015-05-31: 40 mg via SUBCUTANEOUS
  Filled 2015-05-30 (×2): qty 0.4

## 2015-05-30 MED ORDER — FOLIC ACID 1 MG PO TABS
1.0000 mg | ORAL_TABLET | Freq: Every day | ORAL | Status: DC
  Administered 2015-05-31: 1 mg via ORAL
  Filled 2015-05-30: qty 1

## 2015-05-30 MED ORDER — ACETAMINOPHEN 650 MG RE SUPP: 650.0000 mg | Freq: Four times a day (QID) | RECTAL | Status: DC | PRN

## 2015-05-30 MED ORDER — POTASSIUM CHLORIDE 10 MEQ/100ML IV SOLN
10.0000 meq | INTRAVENOUS | Status: AC
  Administered 2015-05-31 (×4): 10 meq via INTRAVENOUS
  Filled 2015-05-30 (×4): qty 100

## 2015-05-30 MED ORDER — INSULIN ASPART 100 UNIT/ML ~~LOC~~ SOLN: 0.0000 [IU] | Freq: Every day | SUBCUTANEOUS | Status: DC

## 2015-05-30 MED ORDER — BISACODYL 10 MG RE SUPP
10.0000 mg | Freq: Every day | RECTAL | Status: DC | PRN
  Administered 2015-05-31: 10 mg via RECTAL
  Filled 2015-05-30: qty 1

## 2015-05-30 MED ORDER — DOCUSATE SODIUM 100 MG PO CAPS
100.0000 mg | ORAL_CAPSULE | Freq: Two times a day (BID) | ORAL | Status: DC
  Administered 2015-05-31 (×2): 100 mg via ORAL
  Filled 2015-05-30 (×4): qty 1

## 2015-05-30 MED ORDER — POTASSIUM CHLORIDE CRYS ER 20 MEQ PO TBCR
40.0000 meq | EXTENDED_RELEASE_TABLET | Freq: Once | ORAL | Status: AC
  Administered 2015-05-30: 40 meq via ORAL
  Filled 2015-05-30: qty 2

## 2015-05-30 MED ORDER — POTASSIUM CHLORIDE 10 MEQ/100ML IV SOLN
10.0000 meq | INTRAVENOUS | Status: AC
  Administered 2015-05-30 (×2): 10 meq via INTRAVENOUS
  Filled 2015-05-30 (×2): qty 100

## 2015-05-30 MED ORDER — MILK AND MOLASSES ENEMA
1.0000 | Freq: Once | RECTAL | Status: AC
  Administered 2015-05-31: 250 mL via RECTAL
  Filled 2015-05-30 (×2): qty 250

## 2015-05-30 MED ORDER — ONDANSETRON HCL 4 MG PO TABS: 4.0000 mg | ORAL_TABLET | Freq: Four times a day (QID) | ORAL | Status: DC | PRN

## 2015-05-30 MED ORDER — SODIUM CHLORIDE 0.9 % IJ SOLN
3.0000 mL | Freq: Two times a day (BID) | INTRAMUSCULAR | Status: DC
  Administered 2015-05-31: 3 mL via INTRAVENOUS

## 2015-05-30 MED ORDER — SODIUM CHLORIDE 0.9 % IV BOLUS (SEPSIS)
1000.0000 mL | Freq: Once | INTRAVENOUS | Status: AC
  Administered 2015-05-30: 1000 mL via INTRAVENOUS

## 2015-05-30 MED ORDER — SODIUM CHLORIDE 0.9 % IV SOLN
INTRAVENOUS | Status: AC
  Administered 2015-05-31: 01:00:00 via INTRAVENOUS

## 2015-05-30 MED ORDER — VITAMIN B-1 100 MG PO TABS
100.0000 mg | ORAL_TABLET | Freq: Every day | ORAL | Status: DC
  Administered 2015-05-31: 100 mg via ORAL
  Filled 2015-05-30: qty 1

## 2015-05-30 NOTE — Progress Notes (Signed)
CSW was consulted by PA to speak with patient regarding homelessness.   CSW reached out to 2 local shelters tonight. However, they do not have any beds.  CSW provided the patient with a list of shelters and food pantries. Patient states that she does not have any questions at this time.  Tilda Burrow, [place] ED CSW 05/30/2015.9:30 PM

## 2015-05-30 NOTE — ED Provider Notes (Signed)
CSN: [id number]     Arrival date & time 05/30/15  1957 History   First MD Initiated Contact with Patient 05/30/15 1959     Chief Complaint  Patient presents with  . Dehydration  . Constipation   [name] is a 49 y.o. female with a history of anxiety, major depressive disorder and diabetes who presents to the emergency department complaining of constipation for the past month. She complains of rectal pain from straining. The patient reports that she has been in jail over the past month and has not been eating or drinking much of anything. She reports drinking only fluids and had not been eating any food. She reports she has not been having any bowel movements in the past month. She complains of rectal pain and no abdominal pain. She reports she got out of jail today as her case was dismissed and she went to [employer] and ate a bunch of food prior to arrival. She reports she has not been taking anything for treatment of her constipation. The patient denies fevers, chills, abdominal pain, nausea, vomiting, chest pain, cough, wheezing, shortness of breath, urinary symptoms, hematuria, lightheadedness or dizziness.  (Consider location/radiation/quality/duration/timing/severity/associated sxs/prior Treatment) HPI  Past Medical History  Diagnosis Date  . Anxiety   . Major depressive disorder, recurrent, severe with psychotic features   . Diabetes mellitus without complication    History reviewed. No pertinent past surgical history. Family History  Problem Relation Age of Onset  . Hypertension Other   . Diabetes Other   . Hypertension Mother   . Diabetes Mother   . Aneurysm Mother   . Hypertension Father   . Diabetes Father   . Hypertension Sister   . Diabetes Sister    History  Substance Use Topics  . Smoking status: Never Smoker   . Smokeless tobacco: Never Used  . Alcohol Use: Yes   OB History    No data available     Review of Systems  Constitutional: Negative for fever and  chills.  HENT: Negative for congestion and sore throat.   Eyes: Negative for visual disturbance.  Respiratory: Negative for cough, shortness of breath and wheezing.   Cardiovascular: Negative for chest pain and palpitations.  Gastrointestinal: Positive for constipation and rectal pain. Negative for nausea, vomiting, abdominal pain and diarrhea.  Genitourinary: Negative for dysuria, frequency, hematuria, decreased urine volume and difficulty urinating.  Musculoskeletal: Negative for back pain and neck pain.  Skin: Negative for rash.  Neurological: Negative for light-headedness and headaches.      Allergies  Review of patient's allergies indicates no known allergies.  Home Medications   Prior to Admission medications   Medication Sig Start Date End Date Taking? Authorizing Provider  dicyclomine (BENTYL) 20 MG tablet Take 1 tablet (20 mg total) by mouth 2 (two) times daily. Patient not taking: Reported on 05/30/2015 04/12/15   Comer Locket, PA-C  omeprazole (PRILOSEC) 20 MG capsule Take 1 capsule (20 mg total) by mouth daily. Patient not taking: Reported on 05/30/2015 04/12/15   Comer Locket, PA-C  polyethylene glycol [facility]) packet Take 17 g by mouth daily. Patient not taking: Reported on 05/30/2015 04/16/15   Charlann Lange, PA-C   BP 113/62 mmHg  Pulse 91  Temp(Src) 98.5 F (36.9 C) (Oral)  Resp 18  SpO2 100%  LMP 12/25/2009 Physical Exam  Constitutional: She is oriented to person, place, and time. She appears well-developed and well-nourished. No distress.  Nontoxic appearing.  HENT:  Head: Normocephalic and atraumatic.  Mouth/Throat:  Oropharynx is clear and moist.  Mucous membranes appear only slightly dry.  Eyes: Conjunctivae are normal. Pupils are equal, round, and reactive to light. Right eye exhibits no discharge. Left eye exhibits no discharge.  Neck: Neck supple.  Cardiovascular: Regular rhythm, normal heart sounds and intact distal pulses.  Exam reveals no gallop  and no friction rub.   No murmur heard. Mildly tachycardic with a heart rate of 108.  Pulmonary/Chest: Effort normal and breath sounds normal. No respiratory distress. She has no wheezes. She has no rales.  Lungs are clear to auscultation bilaterally.  Abdominal: Soft. Bowel sounds are normal. She exhibits no distension and no mass. There is no tenderness. There is no rebound and no guarding.  Abdomen is soft and nontender to palpation.  Genitourinary:  Digital rectal exam performed by me with female RN chaperone. There are no external hemorrhoids noted. Patient has normal anal sphincter tone. Patient appears to have a deep impaction that I am unable to disimpact digitally.  Musculoskeletal: She exhibits no edema.  Lymphadenopathy:    She has no cervical adenopathy.  Neurological: She is alert and oriented to person, place, and time. Coordination normal.  Skin: Skin is warm and dry. No rash noted. She is not diaphoretic. No erythema. No pallor.  Psychiatric: She has a normal mood and affect. Her behavior is normal. She expresses no homicidal and no suicidal ideation.  Patient denies suicidal or homicidal ideations.  Nursing note and vitals reviewed.   ED Course  Procedures (including critical care time) Labs Review Labs Reviewed  BASIC METABOLIC PANEL - Abnormal; Notable for the following:    Potassium 2.4 (*)    Chloride 100 (*)    Glucose, Bld 145 (*)    All other components within normal limits  CBC WITH DIFFERENTIAL/PLATELET - Abnormal; Notable for the following:    RBC 5.33 (*)    MCV 77.9 (*)    MCH 25.5 (*)    All other components within normal limits  URINALYSIS, ROUTINE W REFLEX MICROSCOPIC (NOT AT [facility]) - Abnormal; Notable for the following:    Color, Urine AMBER (*)    APPearance TURBID (*)    Specific Gravity, Urine 1.035 (*)    Bilirubin Urine MODERATE (*)    Ketones, ur >80 (*)    Protein, ur 100 (*)    Leukocytes, UA TRACE (*)    All other components within  normal limits  URINE MICROSCOPIC-ADD ON - Abnormal; Notable for the following:    Squamous Epithelial / LPF MANY (*)    Bacteria, UA MANY (*)    Crystals CA OXALATE CRYSTALS (*)    All other components within normal limits  MAGNESIUM  HEPATIC FUNCTION PANEL  PHOSPHORUS  URINE RAPID DRUG SCREEN, HOSP PERFORMED  PREALBUMIN  MAGNESIUM  PHOSPHORUS  TSH  COMPREHENSIVE METABOLIC PANEL  CBC  CBC  CREATININE, SERUM  CK  POC URINE PREG, ED    Imaging Review Dg Abd 1 View  05/30/2015   CLINICAL DATA:  Generalized abdominal pain  EXAM: ABDOMEN - 1 VIEW  COMPARISON:  None.  FINDINGS: There is a moderate volume colonic stool, within the normal spectrum although somewhat generous. There is no bowel obstruction. There is no extraluminal air. No biliary or urinary calculi are evident.  IMPRESSION: Normal bowel gas pattern.  Generous colonic stool volume.   Electronically Signed   By: Andreas Newport M.D.   On: 05/30/2015 23:53     EKG Interpretation   Date/Time:  Wednesday  May 30 2015 22:31:34 EDT Ventricular Rate:  89 PR Interval:  151 QRS Duration: 89 QT Interval:  437 QTC Calculation: 532 R Axis:   53 Text Interpretation:  Sinus rhythm Inferior infarct, age indeterminate  Prolonged QT interval Confirmed by Jeneen Rinks  MD, [place] ([id number]) on 05/30/2015  11:01:41 PM      Filed Vitals:   05/30/15 2003 05/30/15 2234 05/30/15 2319 05/30/15 2349  BP: 101/74 151/118 125/97 113/62  Pulse: 112 89  91  Temp: 98.5 F (36.9 C)   98.5 F (36.9 C)  TempSrc: Oral   Oral  Resp: 17 16  18   SpO2: 100% 100%  100%     MDM   Meds given in ED:  Medications  milk and molasses enema (not administered)  potassium chloride 10 mEq in 100 mL IVPB (10 mEq Intravenous New Bag/Given 05/30/15 2343)  insulin aspart (novoLOG) injection 0-5 Units (not administered)  insulin aspart (novoLOG) injection 0-9 Units (not administered)  potassium chloride 10 mEq in 100 mL IVPB (not administered)  sodium chloride 0.9  % injection 3 mL (not administered)  acetaminophen (TYLENOL) tablet 650 mg (not administered)    Or  acetaminophen (TYLENOL) suppository 650 mg (not administered)  enoxaparin (LOVENOX) injection 40 mg (not administered)  0.9 %  sodium chloride infusion (not administered)  docusate sodium (COLACE) capsule 100 mg (not administered)  senna (SENOKOT) tablet 8.6 mg (not administered)  polyethylene glycol (MIRALAX / GLYCOLAX) packet 17 g (not administered)  bisacodyl (DULCOLAX) suppository 10 mg (not administered)  sodium phosphate (FLEET) 7-19 GM/118ML enema 1 enema (not administered)  thiamine (B-1) injection 100 mg (not administered)  thiamine (VITAMIN B-1) tablet 100 mg (not administered)  folic acid (FOLVITE) tablet 1 mg (not administered)  multivitamin with minerals tablet 1 tablet (not administered)  sodium chloride 0.9 % bolus 1,000 mL (1,000 mLs Intravenous New Bag/Given 05/30/15 2235)  potassium chloride SA (K-DUR,KLOR-CON) CR tablet 40 mEq (40 mEq Oral Given 05/30/15 2244)    Current Discharge Medication List      Final diagnoses:  Hypokalemia  Obstipation   This is a 49 y.o. female with a history of anxiety, major depressive disorder and diabetes who presents to the emergency department complaining of constipation for the past month. She complains of rectal pain from straining. The patient reports that she has been in jail over the past month and has not been eating or drinking much of anything. She reports drinking only fluids and had not been eating any food. She reports she has not been having any bowel movements in the past month. She complains of rectal pain and no abdominal pain. She reports she got out of jail today as her case was dismissed and she went to [employer] and ate a bunch of food prior to arrival. On exam the patient is afebrile nontoxic appearing. Patient's abdomen is soft and nontender to palpation. On digital rectal exam the patient appears to have a deep fecal  impaction that I am unable to disimpact digitally. Patient's mucous membranes appear only slightly dry. She denies any muscle weakness or cramping currently. She is a negative urine pregnancy test. Urinalysis shows trace leukocytes. CBC is unremarkable. BMP indicates a potassium of 2.4 with a normal magnesium. Patient ordered IV potassium by me as well as oral potassium. Plan for admission for hypokalemia. EKG is unremarkable.   I consulted for admission with hospitalist Dr. Roel Cluck who accepted the patient for admission. The patient is in agreement with admission.   This patient  was discussed with Dr. Jeneen Rinks who agrees with assessment and plan.     Waynetta Pean, PA-C 05/31/15 0005  Tanna Furry, MD 06/05/15 [phone]

## 2015-05-30 NOTE — ED Notes (Signed)
Chaperoned PA Will during rectal exam, afer talking to PA pt agrees to have a PIV line started.

## 2015-05-30 NOTE — H&P (Addendum)
PCP: none  Referring provider Will Dansie PA  Chief Complaint:  constipation  HPI: [name] is a 49 y.o. female   has a past medical history of Anxiety; Major depressive disorder, recurrent, severe with psychotic features; and Diabetes mellitus without complication.   Presented with complaints of constipation since June. Patient has psychiatric history with paranoia. She was seen on June 20 in ER after taking a whole bottle of Pepto-Bismol to try to help with constipation after her salicylate level has come down she was discharged in to police custody. Patient was incarcerated thereafter for failure to appear in court. Patient reports that during her incarceration she did not eat or drink anything for 30 days and once she started to feel lightheaded started to have minimal by mouth intake and only drank fluids Mainly Juice. The smell of the food in [place] made her want to Gag so she stopped eating.  States she did not eat since June 26th. Patient appeared in court today and her charges dismissed she was released from jail after promptly went [employer] where she ate and drank a lot of Gatorade and soda. Patient returns to emerge department with constipation. Perirectal exam she was noted to have firm stool in rectum enema has been ordered. In emergency department patient was noted to have potassium of 2.4. Magnesium was noted to be 2.0 Patient states she has not had a BM since June. She denies any nausea no vomiting. Deneis any rash.  Hospitalist was called for admission for hypokalemia  Review of Systems:    Pertinent positives include:  Constipation, leg cramps  Constitutional:  No weight loss, night sweats, Fevers, chills, fatigue, weight loss  HEENT:  No headaches, Difficulty swallowing,Tooth/dental problems,Sore throat,  No sneezing, itching, ear ache, nasal congestion, post nasal drip,  Cardio-vascular:  No chest pain, Orthopnea, PND, anasarca, dizziness, palpitations.no Bilateral  lower extremity swelling  GI:  No heartburn, indigestion, abdominal pain, nausea, vomiting, diarrhea, change in bowel habits, loss of appetite, melena, blood in stool, hematemesis Resp:  no shortness of breath at rest. No dyspnea on exertion, No excess mucus, no productive cough, No non-productive cough, No coughing up of blood.No change in color of mucus.No wheezing. Skin:  no rash or lesions. No jaundice GU:  no dysuria, change in color of urine, no urgency or frequency. No straining to urinate.  No flank pain.  Musculoskeletal:  No joint pain or no joint swelling. No decreased range of motion. No back pain.  Psych:  No change in mood or affect. No depression or anxiety. No memory loss.  Neuro: no localizing neurological complaints, no tingling, no weakness, no double vision, no gait abnormality, no slurred speech, no confusion  Otherwise ROS are negative except for above, 10 systems were reviewed  Past Medical History: Past Medical History  Diagnosis Date  . Anxiety   . Major depressive disorder, recurrent, severe with psychotic features   . Diabetes mellitus without complication    History reviewed. No pertinent past surgical history.   Medications: Prior to Admission medications   Medication Sig Start Date End Date Taking? Authorizing Provider  bismuth subsalicylate (PEPTO BISMOL) 262 MG/15ML suspension Take 30 mLs by mouth every 6 (six) hours as needed for indigestion.   Yes Historical Provider, MD  dicyclomine (BENTYL) 20 MG tablet Take 1 tablet (20 mg total) by mouth 2 (two) times daily. Patient not taking: Reported on 05/30/2015 04/12/15   Comer Locket, PA-C  omeprazole (PRILOSEC) 20 MG capsule Take 1 capsule (  20 mg total) by mouth daily. Patient not taking: Reported on 05/30/2015 04/12/15   Comer Locket, PA-C  polyethylene glycol [facility]) packet Take 17 g by mouth daily. Patient not taking: Reported on 05/30/2015 04/16/15   Charlann Lange, PA-C    Allergies:  No  Known Allergies  Social History:  Ambulatory   independently   Recently incarcerated     reports that she has never smoked. She has never used smokeless tobacco. She reports that she drinks alcohol. She reports that she does not use illicit drugs.    Family History: family history includes Diabetes in her other; Hypertension in her other.    Physical Exam: Patient Vitals for the past 24 hrs:  BP Temp Temp src Pulse Resp SpO2  05/30/15 2234 (!) 151/118 mmHg - - 89 16 100 %  05/30/15 2003 101/74 mmHg 98.5 F (36.9 C) Oral 112 17 100 %    1. General:  in No Acute distress 2. Psychological: Alert and  Oriented 3. Head/ENT:     Dry Mucous Membranes                          Head Non traumatic, neck supple                          Norma  Dentition 4. SKIN:   decreased Skin turgor,  Skin clean Dry and intact no rash 5. Heart: Regular rate and rhythm no Murmur, Rub or gallop 6. Lungs: Clear to auscultation bilaterally, no wheezes or crackles   7. Abdomen: Soft, non-tender, Non distended 8. Lower extremities: no clubbing, cyanosis, or edema 9. Neurologically Grossly intact, moving all 4 extremities equally 10. MSK: Normal range of motion  body mass index is unknown because there is no weight on file.   Labs on Admission:   Results for orders placed or performed during the hospital encounter of 05/30/15 (from the past 24 hour(s))  Basic metabolic panel     Status: Abnormal   Collection Time: 05/30/15  9:05 PM  Result Value Ref Range   Sodium 139 135 - 145 mmol/L   Potassium 2.4 (LL) 3.5 - 5.1 mmol/L   Chloride 100 (L) 101 - 111 mmol/L   CO2 24 22 - 32 mmol/L   Glucose, Bld 145 (H) 65 - 99 mg/dL   BUN 15 6 - 20 mg/dL   Creatinine, Ser 1.00 0.44 - 1.00 mg/dL   Calcium 9.5 8.9 - 10.3 mg/dL   GFR calc non Af Amer >60 >60 mL/min   GFR calc Af Amer >60 >60 mL/min   Anion gap 15 5 - 15  CBC with Differential     Status: Abnormal   Collection Time: 05/30/15  9:05 PM  Result  Value Ref Range   WBC 6.0 4.0 - 10.5 K/uL   RBC 5.33 (H) 3.87 - 5.11 MIL/uL   Hemoglobin 13.6 12.0 - 15.0 g/dL   HCT 41.5 36.0 - 46.0 %   MCV 77.9 (L) 78.0 - 100.0 fL   MCH 25.5 (L) 26.0 - 34.0 pg   MCHC 32.8 30.0 - 36.0 g/dL   RDW 14.7 11.5 - 15.5 %   Platelets 256 150 - 400 K/uL   Neutrophils Relative % 71 43 - 77 %   Neutro Abs 4.3 1.7 - 7.7 K/uL   Lymphocytes Relative 23 12 - 46 %   Lymphs Abs 1.4 0.7 - 4.0 K/uL   Monocytes  Relative 5 3 - 12 %   Monocytes Absolute 0.3 0.1 - 1.0 K/uL   Eosinophils Relative 1 0 - 5 %   Eosinophils Absolute 0.1 0.0 - 0.7 K/uL   Basophils Relative 0 0 - 1 %   Basophils Absolute 0.0 0.0 - 0.1 K/uL  Magnesium     Status: None   Collection Time: 05/30/15  9:05 PM  Result Value Ref Range   Magnesium 2.0 1.7 - 2.4 mg/dL  POC urine preg, ED     Status: None   Collection Time: 05/30/15 10:44 PM  Result Value Ref Range   Preg Test, Ur NEGATIVE NEGATIVE    UA pending  Lab Results  Component Value Date   HGBA1C 6.6* 08/30/2012    CrCl cannot be calculated (Unknown ideal weight.).  BNP (last 3 results) No results for input(s): PROBNP in the last 8760 hours.  Other results:  I have pearsonaly reviewed this: ECG REPORT  Rate: 89  Rhythm: Sinus rhythm ST&T Change: U waves noted QTC prolongation at 512    There were no vitals filed for this visit.   Cultures:    Component Value Date/Time   SDES URINE, CLEAN CATCH 03/04/2014 1848   SPECREQUEST NONE 03/04/2014 1848   CULT  03/04/2014 1848    Multiple bacterial morphotypes present, none predominant. Suggest appropriate recollection if clinically indicated. Performed at [facility] 03/06/2014 FINAL 03/04/2014 1848     Radiological Exams on Admission: No results found.  Chart has been reviewed  Family   at  Bedside    Assessment/Plan 49 yo F with hx  of DM and delusional disorder admitted with hypokalemia and obstipation with prolongation of QT noted on  ECG  Present on Admission:  . Delusional disorder - will need pshychiatry evaluation. Discussed with Behavioral Health will be seen in the morning  . Hypokalemia - will replace, Mg level within normal limits, will check Phosphate . Obstipation - possibly due to patient's erratic food intake and electrolyte disturbances.   KUB showing large amount of stool  . Prolonged QT interval - will replace electrolytes monitor on telemetry avoid QT prolonging indications  . Hypertension - so far stable continue to monitor patient is not taken any antihypertensives at home Diabetes - diet-controlled will order sliding scale Self-reported starvation - patient does not appear to be clinically in a state of starvation. It is unlikely that she have not had any by mouth intake including fluids for 30 days as this would be incompatible with life. Patient has known delusional disorder. We'll need Behavioral Health psychiatric consult to help manage. For now check electrolytes including phosphorus replace if needed, check prealbumin Check urine for ketones, check CK level   Prophylaxis:   Lovenox   CODE STATUS:  FULL CODE  as per patient    Disposition:  To home once workup is complete and patient is stable  Other plan as per orders.  I have spent a total of 65 min on this admission extra time taken to discuss with behavioral health   [place] 05/30/2015, 11:00 PM  [facility]  Pager [phone]   after 2 AM please page floor coverage PA If 7AM-7PM, please contact the day team taking care of the patient  [email or web address]  Password [email or web address]

## 2015-05-30 NOTE — ED Notes (Signed)
PA currently in room, will draw labs after.

## 2015-05-30 NOTE — ED Notes (Signed)
Patient refused IV line, sts she would rather try to drink something, pt also sts she is ready to leave, but if doctor thinks that she needs an enema she will stay for that, but only if it doesn't take a long time. PA notified

## 2015-05-30 NOTE — ED Notes (Signed)
Unsuccessful attempt to insert PIV, another RN will attempt ultrasound guided IV

## 2015-05-30 NOTE — ED Notes (Signed)
Pt reports constipation since June. Was seen at [facility] and here in June. Fecal impaction was attempted without success. Was never given an enema for unknown reason. Pt has been on liquid diet- Powerade and soda. Has not attempted stool softeners/laxatives/etc. C/o rectal pain from straining. Also appears dehydrated. No other c/c.

## 2015-05-31 DIAGNOSIS — K5900 Constipation, unspecified: Secondary | ICD-10-CM

## 2015-05-31 DIAGNOSIS — I4581 Long QT syndrome: Secondary | ICD-10-CM

## 2015-05-31 DIAGNOSIS — E876 Hypokalemia: Secondary | ICD-10-CM

## 2015-05-31 DIAGNOSIS — F22 Delusional disorders: Secondary | ICD-10-CM

## 2015-05-31 DIAGNOSIS — F4325 Adjustment disorder with mixed disturbance of emotions and conduct: Secondary | ICD-10-CM

## 2015-05-31 DIAGNOSIS — E440 Moderate protein-calorie malnutrition: Secondary | ICD-10-CM | POA: Insufficient documentation

## 2015-05-31 LAB — CBC
HEMATOCRIT: 34.5 % — AB (ref 36.0–46.0)
Hemoglobin: 11.1 g/dL — ABNORMAL LOW (ref 12.0–15.0)
MCH: 25.2 pg — ABNORMAL LOW (ref 26.0–34.0)
MCHC: 32.2 g/dL (ref 30.0–36.0)
MCV: 78.2 fL (ref 78.0–100.0)
Platelets: 220 10*3/uL (ref 150–400)
RBC: 4.41 MIL/uL (ref 3.87–5.11)
RDW: 14.6 % (ref 11.5–15.5)
WBC: 5.4 10*3/uL (ref 4.0–10.5)

## 2015-05-31 LAB — COMPREHENSIVE METABOLIC PANEL
ALK PHOS: 71 U/L (ref 38–126)
ALT: 16 U/L (ref 14–54)
AST: 20 U/L (ref 15–41)
Albumin: 3.2 g/dL — ABNORMAL LOW (ref 3.5–5.0)
Anion gap: 10 (ref 5–15)
BUN: 11 mg/dL (ref 6–20)
CALCIUM: 8.5 mg/dL — AB (ref 8.9–10.3)
CO2: 24 mmol/L (ref 22–32)
Chloride: 103 mmol/L (ref 101–111)
Creatinine, Ser: 0.83 mg/dL (ref 0.44–1.00)
GFR calc Af Amer: >60 mL/min (ref >60)
Glucose, Bld: 172 mg/dL — ABNORMAL HIGH (ref 65–99)
POTASSIUM: 2.7 mmol/L — AB (ref 3.5–5.1)
Sodium: 137 mmol/L (ref 135–145)
TOTAL PROTEIN: 6 g/dL — AB (ref 6.5–8.1)
Total Bilirubin: 0.8 mg/dL (ref 0.3–1.2)

## 2015-05-31 LAB — GLUCOSE, CAPILLARY
GLUCOSE-CAPILLARY: 137 mg/dL — AB (ref 65–99)
GLUCOSE-CAPILLARY: 142 mg/dL — AB (ref 65–99)
Glucose-Capillary: 139 mg/dL — ABNORMAL HIGH (ref 65–99)
Glucose-Capillary: 145 mg/dL — ABNORMAL HIGH (ref 65–99)

## 2015-05-31 LAB — RAPID URINE DRUG SCREEN, HOSP PERFORMED
AMPHETAMINES: NOT DETECTED
BARBITURATES: NOT DETECTED
BENZODIAZEPINES: POSITIVE — AB
Cocaine: NOT DETECTED
Opiates: NOT DETECTED
Tetrahydrocannabinol: NOT DETECTED

## 2015-05-31 LAB — TSH: TSH: 2.495 u[IU]/mL (ref 0.350–4.500)

## 2015-05-31 LAB — CK: Total CK: 476 U/L — ABNORMAL HIGH (ref 38–234)

## 2015-05-31 LAB — PREALBUMIN: Prealbumin: 10.7 mg/dL — ABNORMAL LOW (ref 18–38)

## 2015-05-31 LAB — POTASSIUM: Potassium: 3.2 mmol/L — ABNORMAL LOW (ref 3.5–5.1)

## 2015-05-31 LAB — PHOSPHORUS: Phosphorus: 2 mg/dL — ABNORMAL LOW (ref 2.5–4.6)

## 2015-05-31 LAB — MAGNESIUM: Magnesium: 1.9 mg/dL (ref 1.7–2.4)

## 2015-05-31 MED ORDER — FLEET ENEMA 7-19 GM/118ML RE ENEM
1.0000 | ENEMA | Freq: Once | RECTAL | Status: AC
  Administered 2015-05-31: 1 via RECTAL
  Filled 2015-05-31: qty 1

## 2015-05-31 MED ORDER — POTASSIUM CHLORIDE CRYS ER 20 MEQ PO TBCR: 40.0000 meq | EXTENDED_RELEASE_TABLET | ORAL | Status: DC

## 2015-05-31 MED ORDER — POTASSIUM CHLORIDE CRYS ER 20 MEQ PO TBCR
40.0000 meq | EXTENDED_RELEASE_TABLET | ORAL | Status: AC
  Administered 2015-05-31 (×2): 40 meq via ORAL
  Filled 2015-05-31 (×2): qty 2

## 2015-05-31 MED ORDER — CHLORHEXIDINE GLUCONATE 0.12 % MT SOLN
15.0000 mL | Freq: Two times a day (BID) | OROMUCOSAL | Status: DC
  Administered 2015-05-31 (×2): 15 mL via OROMUCOSAL
  Filled 2015-05-31 (×4): qty 15

## 2015-05-31 MED ORDER — POLYETHYLENE GLYCOL 3350 17 G PO PACK: 17.0000 g | PACK | Freq: Every day | ORAL | Status: DC

## 2015-05-31 MED ORDER — ENSURE ENLIVE PO LIQD
237.0000 mL | Freq: Two times a day (BID) | ORAL | Status: DC
  Administered 2015-05-31: 237 mL via ORAL

## 2015-05-31 MED ORDER — POTASSIUM & SODIUM PHOSPHATES 280-160-250 MG PO PACK
1.0000 | PACK | Freq: Three times a day (TID) | ORAL | Status: AC
  Administered 2015-05-31: 1 via ORAL
  Filled 2015-05-31: qty 1

## 2015-05-31 MED ORDER — PROMETHAZINE HCL 25 MG/ML IJ SOLN
12.5000 mg | Freq: Once | INTRAMUSCULAR | Status: AC
  Administered 2015-05-31: 12.5 mg via INTRAVENOUS
  Filled 2015-05-31: qty 1

## 2015-05-31 MED ORDER — POTASSIUM CHLORIDE CRYS ER 20 MEQ PO TBCR
40.0000 meq | EXTENDED_RELEASE_TABLET | Freq: Once | ORAL | Status: AC
  Administered 2015-05-31: 40 meq via ORAL
  Filled 2015-05-31: qty 2

## 2015-05-31 MED ORDER — MAGNESIUM SULFATE IN D5W 10-5 MG/ML-% IV SOLN
1.0000 g | Freq: Once | INTRAVENOUS | Status: AC
  Administered 2015-05-31: 1 g via INTRAVENOUS
  Filled 2015-05-31: qty 100

## 2015-05-31 MED ORDER — DOCUSATE SODIUM 100 MG PO CAPS: 100.0000 mg | ORAL_CAPSULE | Freq: Two times a day (BID) | ORAL | Status: DC

## 2015-05-31 MED ORDER — MAGNESIUM CITRATE PO SOLN
1.0000 | Freq: Once | ORAL | Status: AC
  Administered 2015-05-31: 1 via ORAL

## 2015-05-31 MED ORDER — CETYLPYRIDINIUM CHLORIDE 0.05 % MT LIQD
7.0000 mL | Freq: Two times a day (BID) | OROMUCOSAL | Status: DC
  Administered 2015-05-31 (×2): 7 mL via OROMUCOSAL

## 2015-05-31 NOTE — Consult Note (Signed)
[facility] Face-to-Face Psychiatry Consult   Reason for Consult:   Depression none delusional disorder Referring Physician:  Dr. Clementeen Graham Patient Identification: [name] MRN:  [id number] Principal Diagnosis: Adjustment disorder with mixed disturbance of emotions and conduct Diagnosis:   Patient Active Problem List   Diagnosis Date Noted  . Hypokalemia [E87.6] 05/30/2015  . Obstipation [K59.00] 05/30/2015  . Prolonged QT interval [I45.81] 05/30/2015  . Hypertension [I10] 05/30/2015  . Adjustment disorder with mixed disturbance of emotions and conduct [F43.25] 04/15/2015  . 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 Suicidal ideations [R45.851] 03/12/2015  . Diabetes [E11.9] 09/06/2012  . Delusional disorder [F22] 04/16/2012    Total Time spent with patient: 1 hour  Subjective:   [name] is a 49 y.o. female patient admitted with delusional psychosis.  HPI:  [name] is a 49 y.o. female  Seen face-to-face for psychiatric consultation and evaluation of delusional behaviors , depression and recent incarceration for not showing up in a court for the property damage and recent acute psychiatric hospitalization at the release is [facility] for depression and suicidal ideation.  Patient reported she has been followed up by several people and harassing her because she did not date them in the past.Patient reportedly suffering with unknown mental illness or depression and delusional disorder and has been in and out of the acute psychiatric hospitalizations. Patient reportedly lost her job in April 2016 than she lost her car and placed live.  Patient stated her mom is not going to be supporting her because of her previous relationship issues and behavioral problems. Patient reported she has been in contact with her sister who might be able to provide some kind  Social support. Patient was incarcerated for 3 weeks and  noncompliant with her diet fund fluids to drink which leads to constipation and required to be hospitalized. Patient denies current symptoms of depression, anxiety, psychosis, paranoia, and delusions.  Patient denies suicidal/homicidal ideation, intentions or plans. Patient contract for safety and will be contact family members regarding place to live.  Patient has no previous history of suicidal attempts. Psychiatric social service can provide information about local shelters if needed. Patient stated she is not seeing any outpatient psychiatrist and does not believe she needed one at this time.  HPI Elements:   Location:  Adjustment disorder. Quality:  Fair to poor. Severity:  Recent incarceration and failed to eat and drink leads to constipation. Timing:  Psychosocial stressors. Duration:  For several months. Context:  Multiple psychosocial stressors, relationship problems.  Past Medical History:  Past Medical History  Diagnosis Date  . Anxiety   . Major depressive disorder, recurrent, severe with psychotic features   . Diabetes mellitus without complication    History reviewed. No pertinent past surgical history. Family History:  Family History  Problem Relation Age of Onset  . Hypertension Other   . Diabetes Other   . Hypertension Mother   . Diabetes Mother   . Aneurysm Mother   . Hypertension Father   . Diabetes Father   . Hypertension Sister   . Diabetes Sister    Social History:  History  Alcohol Use  . Yes     History  Drug Use No    History   Social History  . Marital Status: Single    Spouse Name: N/A  . Number of Children: N/A  . Years of Education: N/A   Social History Main Topics  .  Smoking status: Never Smoker   . Smokeless tobacco: Never Used  . Alcohol Use: Yes  . Drug Use: No  . Sexual Activity: Not on file   Other Topics Concern  . None   Social History Narrative   Additional Social History:                          Allergies:   No Known Allergies  Labs:  Results for orders placed or performed during the hospital encounter of 05/30/15 (from the past 48 hour(s))  Basic metabolic panel     Status: Abnormal   Collection Time: 05/30/15  9:05 PM  Result Value Ref Range   Sodium 139 135 - 145 mmol/L   Potassium 2.4 (LL) 3.5 - 5.1 mmol/L    Comment: CRITICAL RESULT CALLED TO, READ BACK BY AND VERIFIED WITH: Marjie Skiff RN 2153 05/30/15 A NAVARRO    Chloride 100 (L) 101 - 111 mmol/L   CO2 24 22 - 32 mmol/L   Glucose, Bld 145 (H) 65 - 99 mg/dL   BUN 15 6 - 20 mg/dL   Creatinine, Ser 1.00 0.44 - 1.00 mg/dL   Calcium 9.5 8.9 - 10.3 mg/dL   GFR calc non Af Amer >60 >60 mL/min   GFR calc Af Amer >60 >60 mL/min    Comment: (NOTE) The eGFR has been calculated using the CKD EPI equation. This calculation has not been validated in all clinical situations. eGFR's persistently <60 mL/min signify possible Chronic Kidney Disease.    Anion gap 15 5 - 15  CBC with Differential     Status: Abnormal   Collection Time: 05/30/15  9:05 PM  Result Value Ref Range   WBC 6.0 4.0 - 10.5 K/uL   RBC 5.33 (H) 3.87 - 5.11 MIL/uL   Hemoglobin 13.6 12.0 - 15.0 g/dL   HCT 41.5 36.0 - 46.0 %   MCV 77.9 (L) 78.0 - 100.0 fL   MCH 25.5 (L) 26.0 - 34.0 pg   MCHC 32.8 30.0 - 36.0 g/dL   RDW 14.7 11.5 - 15.5 %   Platelets 256 150 - 400 K/uL   Neutrophils Relative % 71 43 - 77 %   Neutro Abs 4.3 1.7 - 7.7 K/uL   Lymphocytes Relative 23 12 - 46 %   Lymphs Abs 1.4 0.7 - 4.0 K/uL   Monocytes Relative 5 3 - 12 %   Monocytes Absolute 0.3 0.1 - 1.0 K/uL   Eosinophils Relative 1 0 - 5 %   Eosinophils Absolute 0.1 0.0 - 0.7 K/uL   Basophils Relative 0 0 - 1 %   Basophils Absolute 0.0 0.0 - 0.1 K/uL  Magnesium     Status: None   Collection Time: 05/30/15  9:05 PM  Result Value Ref Range   Magnesium 2.0 1.7 - 2.4 mg/dL  CK     Status: Abnormal   Collection Time: 05/30/15  9:05 PM  Result Value Ref Range   Total CK 476 (H) 38 - 234 U/L   Urinalysis, Routine w reflex microscopic (not at [facility])     Status: Abnormal   Collection Time: 05/30/15 10:33 PM  Result Value Ref Range   Color, Urine AMBER (A) YELLOW    Comment: BIOCHEMICALS MAY BE AFFECTED BY COLOR   APPearance TURBID (A) CLEAR   Specific Gravity, Urine 1.035 (H) 1.005 - 1.030   pH 6.0 5.0 - 8.0   Glucose, UA NEGATIVE NEGATIVE mg/dL   Hgb  urine dipstick NEGATIVE NEGATIVE   Bilirubin Urine MODERATE (A) NEGATIVE   Ketones, ur >80 (A) NEGATIVE mg/dL   Protein, ur 100 (A) NEGATIVE mg/dL   Urobilinogen, UA 1.0 0.0 - 1.0 mg/dL   Nitrite NEGATIVE NEGATIVE   Leukocytes, UA TRACE (A) NEGATIVE  Urine microscopic-add on     Status: Abnormal   Collection Time: 05/30/15 10:33 PM  Result Value Ref Range   Squamous Epithelial / LPF MANY (A) RARE   WBC, UA 3-6 <3 WBC/hpf   Bacteria, UA MANY (A) RARE   Crystals CA OXALATE CRYSTALS (A) NEGATIVE   Urine-Other MUCOUS PRESENT   POC urine preg, ED     Status: None   Collection Time: 05/30/15 10:44 PM  Result Value Ref Range   Preg Test, Ur NEGATIVE NEGATIVE    Comment:        THE SENSITIVITY OF THIS METHODOLOGY IS >24 mIU/mL   Hepatic function panel     Status: None   Collection Time: 05/30/15 10:58 PM  Result Value Ref Range   Total Protein 8.1 6.5 - 8.1 g/dL   Albumin 4.4 3.5 - 5.0 g/dL   AST 25 15 - 41 U/L   ALT 24 14 - 54 U/L   Alkaline Phosphatase 95 38 - 126 U/L   Total Bilirubin 1.0 0.3 - 1.2 mg/dL   Bilirubin, Direct 0.2 0.1 - 0.5 mg/dL   Indirect Bilirubin 0.8 0.3 - 0.9 mg/dL  Phosphorus     Status: None   Collection Time: 05/30/15 10:58 PM  Result Value Ref Range   Phosphorus 2.5 2.5 - 4.6 mg/dL  Glucose, capillary     Status: Abnormal   Collection Time: 05/31/15 12:00 AM  Result Value Ref Range   Glucose-Capillary 139 (H) 65 - 99 mg/dL  Urine rapid drug screen (hosp performed)     Status: Abnormal   Collection Time: 05/31/15 12:53 AM  Result Value Ref Range   Opiates NONE DETECTED NONE DETECTED    Cocaine NONE DETECTED NONE DETECTED   Benzodiazepines POSITIVE (A) NONE DETECTED   Amphetamines NONE DETECTED NONE DETECTED   Tetrahydrocannabinol NONE DETECTED NONE DETECTED   Barbiturates NONE DETECTED NONE DETECTED    Comment:        DRUG SCREEN FOR MEDICAL PURPOSES ONLY.  IF CONFIRMATION IS NEEDED FOR ANY PURPOSE, NOTIFY LAB WITHIN 5 DAYS.        LOWEST DETECTABLE LIMITS FOR URINE DRUG SCREEN Drug Class       Cutoff (ng/mL) Amphetamine      1000 Barbiturate      200 Benzodiazepine   568 Tricyclics       127 Opiates          300 Cocaine          300 THC              50   Prealbumin     Status: Abnormal   Collection Time: 05/31/15  5:03 AM  Result Value Ref Range   Prealbumin 10.7 (L) 18 - 38 mg/dL    Comment: Performed at [facility]  Magnesium     Status: None   Collection Time: 05/31/15  5:03 AM  Result Value Ref Range   Magnesium 1.9 1.7 - 2.4 mg/dL  Phosphorus     Status: Abnormal   Collection Time: 05/31/15  5:03 AM  Result Value Ref Range   Phosphorus 2.0 (L) 2.5 - 4.6 mg/dL  TSH     Status: None  Collection Time: 05/31/15  5:03 AM  Result Value Ref Range   TSH 2.495 0.350 - 4.500 uIU/mL  Comprehensive metabolic panel     Status: Abnormal   Collection Time: 05/31/15  5:03 AM  Result Value Ref Range   Sodium 137 135 - 145 mmol/L   Potassium 2.7 (LL) 3.5 - 5.1 mmol/L    Comment: NO VISIBLE HEMOLYSIS REPEATED TO VERIFY CRITICAL RESULT CALLED TO, READ BACK BY AND VERIFIED WITH: YOUNG,C. RN AT 8242 05/31/15 MULLINS,T    Chloride 103 101 - 111 mmol/L   CO2 24 22 - 32 mmol/L   Glucose, Bld 172 (H) 65 - 99 mg/dL   BUN 11 6 - 20 mg/dL   Creatinine, Ser 0.83 0.44 - 1.00 mg/dL   Calcium 8.5 (L) 8.9 - 10.3 mg/dL   Total Protein 6.0 (L) 6.5 - 8.1 g/dL   Albumin 3.2 (L) 3.5 - 5.0 g/dL   AST 20 15 - 41 U/L   ALT 16 14 - 54 U/L   Alkaline Phosphatase 71 38 - 126 U/L   Total Bilirubin 0.8 0.3 - 1.2 mg/dL   GFR calc non Af Amer >60 >60 mL/min   GFR calc  Af Amer >60 >60 mL/min    Comment: (NOTE) The eGFR has been calculated using the CKD EPI equation. This calculation has not been validated in all clinical situations. eGFR's persistently <60 mL/min signify possible Chronic Kidney Disease.    Anion gap 10 5 - 15  CBC     Status: Abnormal   Collection Time: 05/31/15  5:03 AM  Result Value Ref Range   WBC 5.4 4.0 - 10.5 K/uL    Comment: Win COUNT CONFIRMED ON SMEAR   RBC 4.41 3.87 - 5.11 MIL/uL   Hemoglobin 11.1 (L) 12.0 - 15.0 g/dL   HCT 34.5 (L) 36.0 - 46.0 %   MCV 78.2 78.0 - 100.0 fL   MCH 25.2 (L) 26.0 - 34.0 pg   MCHC 32.2 30.0 - 36.0 g/dL   RDW 14.6 11.5 - 15.5 %   Platelets 220 150 - 400 K/uL  Glucose, capillary     Status: Abnormal   Collection Time: 05/31/15  7:32 AM  Result Value Ref Range   Glucose-Capillary 145 (H) 65 - 99 mg/dL    Vitals: Blood pressure 99/67, pulse 81, temperature 98.2 F (36.8 C), temperature source Oral, resp. rate 18, last menstrual period 12/25/2009, SpO2 100 %.  Risk to Self: Is patient at risk for suicide?: No Risk to Others:   Prior Inpatient Therapy:   Prior Outpatient Therapy:    Current Facility-Administered Medications  Medication Dose Route Frequency Provider Last Rate Last Dose  . acetaminophen (TYLENOL) tablet 650 mg  650 mg Oral Q6H PRN Toy Baker, MD       Or  . acetaminophen (TYLENOL) suppository 650 mg  650 mg Rectal Q6H PRN Toy Baker, MD      . antiseptic oral rinse (CPC / CETYLPYRIDINIUM CHLORIDE 0.05%) solution 7 mL  7 mL Mouth Rinse q12n4p Toy Baker, MD      . bisacodyl (DULCOLAX) suppository 10 mg  10 mg Rectal Daily PRN Toy Baker, MD      . chlorhexidine (PERIDEX) 0.12 % solution 15 mL  15 mL Mouth Rinse BID Toy Baker, MD   15 mL at 05/31/15 0827  . docusate sodium (COLACE) capsule 100 mg  100 mg Oral BID Toy Baker, MD   100 mg at 05/31/15 1035  . enoxaparin (LOVENOX) injection 40  mg  40 mg Subcutaneous QHS  Toy Baker, MD   40 mg at 05/31/15 0123  . feeding supplement (ENSURE ENLIVE) (ENSURE ENLIVE) liquid 237 mL  237 mL Oral BID BM Anastassia Doutova, MD   237 mL at 05/31/15 1036  . folic acid (FOLVITE) tablet 1 mg  1 mg Oral Daily Toy Baker, MD   1 mg at 05/31/15 1035  . insulin aspart (novoLOG) injection 0-5 Units  0-5 Units Subcutaneous QHS Toy Baker, MD   0 Units at 05/31/15 0103  . insulin aspart (novoLOG) injection 0-9 Units  0-9 Units Subcutaneous TID WC Toy Baker, MD   1 Units at 05/31/15 0828  . magnesium sulfate IVPB 1 g 100 mL  1 g Intravenous Once Nishant Dhungel, MD   1 g at 05/31/15 1034  . multivitamin with minerals tablet 1 tablet  1 tablet Oral Daily Toy Baker, MD   1 tablet at 05/31/15 1036  . polyethylene glycol (MIRALAX / GLYCOLAX) packet 17 g  17 g Oral Daily PRN Toy Baker, MD      . potassium chloride SA (K-DUR,KLOR-CON) CR tablet 40 mEq  40 mEq Oral Q3H Nishant Dhungel, MD   40 mEq at 05/31/15 1036  . senna (SENOKOT) tablet 8.6 mg  1 tablet Oral BID Toy Baker, MD   8.6 mg at 05/31/15 1036  . sodium chloride 0.9 % injection 3 mL  3 mL Intravenous Q12H Toy Baker, MD   3 mL at 05/31/15 0126  . thiamine (VITAMIN B-1) tablet 100 mg  100 mg Oral Daily Toy Baker, MD   100 mg at 05/31/15 1035    Musculoskeletal: Strength & Muscle Tone: within normal limits Gait & Station: normal Patient leans: N/A  Psychiatric Specialty Exam: Physical Exam   ROS constipation and abdo pain No Fever-chills, No Headache, No changes with Vision or hearing, reports vertigo No problems swallowing food or Liquids, No Chest pain, Cough or Shortness of Breath, No Abdominal pain, No Nausea or Vommitting, Bowel movements are regular, No Blood in stool or Urine, No dysuria, No new skin rashes or bruises, No new joints pains-aches,  No new weakness, tingling, numbness in any extremity, No recent weight gain or loss, No  polyuria, polydypsia or polyphagia,   A full 10 point Review of Systems was done, except as stated above, all other Review of Systems were negative.  Blood pressure 99/67, pulse 81, temperature 98.2 F (36.8 C), temperature source Oral, resp. rate 18, last menstrual period 12/25/2009, SpO2 100 %.There is no weight on file to calculate BMI.  General Appearance: Casual  Eye Contact::  Good  Speech:  Clear and Coherent  Volume:  Decreased  Mood:  Anxious  Affect:  Appropriate and Congruent  Thought Process:  Coherent and Goal Directed  Orientation:  Full (Time, Place, and Person)  Thought Content:  Delusions and Rumination  Suicidal Thoughts:  No  Homicidal Thoughts:  No  Memory:  Immediate;   Good Recent;   Good  Judgement:  Intact  Insight:  Fair  Psychomotor Activity:  Normal  Concentration:  Good  Recall:  Good  Fund of Knowledge:Good  Language: Good  Akathisia:  Negative  Handed:  Right  AIMS (if indicated):     Assets:  Communication Skills Desire for Improvement Leisure Time Resilience Social Support Talents/Skills Transportation  ADL's:  Intact  Cognition: WNL  Sleep:      Medical Decision Making: New problem, with additional work up planned, Review of Psycho-Social Stressors (1), Review  or order clinical lab tests (1), Established Problem, Worsening (2), Review of Last Therapy Session (1), Review or order medicine tests (1), Review of Medication Regimen & Side Effects (2) and Review of New Medication or Change in Dosage (2)  Treatment Plan Summary: Patient presented with his severe constipation and stresses related to psychosocial issues, financial difficulties, on unemployment, no job and relationship problems with her mother.patient has no safety concerns. Patient to urine drug screen is positive for benzodiazepine   Daily contact with patient to assess and evaluate symptoms and progress in treatment and Medication management  Plan:  Patient has no safety  concerns Recommended no psychiatric medication at this time Patient does not meet criteria for psychiatric inpatient admission. Supportive therapy provided about ongoing stressors.  Appreciate psychiatric consultation Please contact [phone] or [phone] if needs further assistance   Disposition: Discharge home with community resources.   Coady Train,JANARDHAHA R. 05/31/2015 10:39 AM

## 2015-05-31 NOTE — Care Management Note (Signed)
Case Management Note  Patient Details  Name: [name] MRN: [id number] Date of Birth: 06-Aug-1966  Subjective/Objective:                 hypokalemia depression conspitation   Action/Plan: Date:  May 31, 2015 U.R. performed for needs and level of care. Will continue to follow for Case Management needs.  Velva Harman, RN, BSN, [place]   [phone]  Expected Discharge Date:                  Expected Discharge Plan:  Home/Self Care  In-House Referral:  Clinical Social Work  Discharge planning Services  CM Consult  Post Acute Care Choice:  NA Choice offered to:  NA  DME Arranged:  N/A DME Agency:  NA  HH Arranged:  NA HH Agency:  NA  Status of Service:  In process, will continue to follow  Medicare Important Message Given:    Date Medicare IM Given:    Medicare IM give by:    Date Additional Medicare IM Given:    Additional Medicare Important Message give by:     If discussed at [place] of Stay Meetings, dates discussed:    Additional Comments:  Leeroy Cha, RN 05/31/2015, 11:34 AM

## 2015-05-31 NOTE — Progress Notes (Signed)
Initial Nutrition Assessment  DOCUMENTATION CODES:   Non-severe (moderate) malnutrition in context of acute illness/injury, Obesity unspecified  INTERVENTION:  - Continue Ensure Enlive BID - RD will continue to monitor for needs  NUTRITION DIAGNOSIS:   Inadequate oral intake related to acute illness as evidenced by per patient/family report, meal completion < 25%.  GOAL:   Patient will meet greater than or equal to 90% of their needs  MONITOR:   PO intake, Supplement acceptance, Weight trends, Labs, I & O's  REASON FOR ASSESSMENT:   Malnutrition Screening Tool  ASSESSMENT:   Presented with complaints of constipation since June. Patient has psychiatric history with paranoia. She was seen on June 20 in ER after taking a whole bottle of Pepto-Bismol to try to help with constipation after her salicylate level has come down she was discharged in to police custody. Patient was incarcerated thereafter for failure to appear in court. Patient reports that during her incarceration she did not eat or drink anything for 30 days and once she started to feel lightheaded started to have minimal by mouth intake and only drank fluids Mainly Juice. The smell of the food in [place] made her want to Gag so she stopped eating. States she did not eat since June 26th. Patient appeared in court today and her charges dismissed she was released from jail after promptly went [employer] where she ate and drank a lot of Gatorade and soda. Patient returns to emerge department with constipation.   Pt seen for MST. BMI indicates morbid obesity but no new weight since 04/15/15. Pt reports that she has had constipation without nausea or vomiting since the end of June. She indicates that during this time she was drinking fluids without issue but the only solid foods she consumed were an apple and orange which she tolerated without issue.  She states she was drinking Ensure this AM and tolerated well. She has ordered a chef  salad with fruit for lunch and also requests prune juice which RD ordered. She indicates that since June she has lost a pant's size but was not being weighed in jail so unsure of amount of weight lost; no update in weight since admission.   Pt not meeting needs PTA. Noted BM from this AM and will monitor intakes as constipation resolved. Mild muscle and fat wasting noted. Medications reviewed. Labs reviewed; CBGs: 139 and 145 mg/dL, K: 2.7 mmol/L, Ca: 8.5 mg/dL, Phos 2 mg/dL.  Diet Order:  Diet Carb Modified Fluid consistency:: Thin; Room service appropriate?: Yes  Skin:  Reviewed, no issues  Last BM:  8/4  Height:   Ht Readings from Last 1 Encounters:  04/15/15 5\' 5"  (1.651 m)    Weight:   Wt Readings from Last 1 Encounters:  04/15/15 252 lb (114.306 kg)    Ideal Body Weight:  56.82 kg/m2  BMI:  41.9 kg/m2  Estimated Nutritional Needs:   Kcal:  8563-1497  Protein:  85-95 grams  Fluid:  2.5 L/day  EDUCATION NEEDS:   No education needs identified at this time     Jarome Matin, RD, LDN Inpatient Clinical Dietitian Pager # [phone] After hours/weekend pager # [phone]
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
## 2015-05-31 NOTE — Progress Notes (Signed)
CRITICAL VALUE ALERT  Critical value received:  *K+ 2.7  Date of notification:  05/31/2015  Time of notification:  0730  Critical value read back:Yes.    Nurse who received alert:  Synetta Fail   MD notified (1st page):  Dhungel  Time of first page:  0730  MD notified (2nd page):  Time of second page:  Responding MD:  Dhungel  Time MD responded:  [phone]

## 2015-05-31 NOTE — Progress Notes (Signed)
Clinical Social Work  Patient has been discharged. CSW provided resources for [employer] of the [place] for community follow up. Patient reports her sister is at work but should be able to stay with her since her mom is not allowing her to return to her home. CSW provided shelter list and information for [employer] as well in case patient's plans to stay with sister falls through. CSW provided bus pass and informed RN of DC plans.  Patient reports no further concerns. CSW is signing off but available if needed.  [place], [place] [phone]

## 2015-05-31 NOTE — Discharge Summary (Addendum)
Physician Discharge Summary  [name] EZM:[id number] DOB: 07/07/1966 DOA: 05/30/2015  PCP: does not have a PCP Admit date: 05/30/2015 Discharge date: 05/31/2015  Time spent: <30 minutes  Recommendations for Outpatient Follow-up:  1. Discharge to family/ shelter. Outpatient psych referral provided.  Discharge Diagnoses:  Principal Problem:   Adjustment disorder with mixed disturbance of emotions and conduct  Active Problems:   Delusional disorder   Diabetes   Hypokalemia   Obstipation   Prolonged QT interval   Hypertension   Malnutrition of moderate degree   Discharge Condition: fair  Diet recommendation: regular  There were no vitals filed for this visit.  History of present illness:  49 y.o. female with past medical history of Anxiety; Major depressive disorder, recurrent, severe with psychotic features; and Diabetes mellitus without complication presented with complaints of constipation since June. Patient has psychiatric history with paranoia. She was seen on June 20 in ER after taking a whole bottle of Pepto-Bismol for  Constipation. her salicylate level came down she was discharged in to police custody. Patient was incarcerated thereafter for failure to appear in court. Patient reports that during her incarceration she did not eat or drink anything for 30 days and once she started to feel lightheaded started to have minimal by mouth intake and only drank fluids, mainly Juice. The smell of the food in [place] made her want to Gag so she stopped eating. States she did not eat since June 26th. Patient appeared in court on 8/3 and her charges dismissed she was released from jail . She then went to  [employer] where she ate and drank a lot of Gatorade and soda. Patient returned to emergency department with constipation. Perirectal exam she was noted to have firm stool in rectum and enema was ordered. In emergency department patient was noted to have potassium of 2.4. Magnesium was noted to  be 2.0 Patient states she has not had a BM since June. She denied any nausea no vomiting.  Hospitalist was called for admission for hypokalemia.  Hospital Course:  Delusional disorder  Seen by psych consult. suggests this is more of a personality disorder. She is not at threat to herself. No further recommendations.   . Hypokalemia  -replaced . Mg normal . Noted for low phosphorous. Will order a dose of sodium phosphate powder.  . Obstipation  - possibly due to patient's erratic food intake and electrolyte disturbances. KUB showing large amount of stool . Given 2 rounds of enema with some response. Abdomen feels better. i doubt she is fully constipated for over a month.  Also received a dose of magnesium citrate. Will discharge her on daily senna -colace and miralax.  . Prolonged QT interval replenished k. Repeat EKG showed normal Qtc.  Marland Kitchen Hypertension  - not on meds stable .  Will not start any meds due to poor likelyhood of outpt follow up  Diabetes mellitus Diet controlled. Counseled  on diet and exercise to lose weight.  patient stable for discharge home.     Disposition: patient plans to go and live with her sister  Discharge Exam: Filed Vitals:   05/31/15 1403  BP: 136/92  Pulse: 90  Temp: 98.5 F (36.9 C)  Resp: 18    General: NAD HEENT: moist oral mucosa  chest: clear b/l  CVS: NS1&S2, no murmurs GI: soft, NT, mild distention , BS+ Musculoskeletal: warm, no edema  CNS: alert and oreinted   Discharge Instructions    Current Discharge Medication List  START taking these medications   Details  docusate sodium (COLACE) 100 MG capsule Take 1 capsule (100 mg total) by mouth 2 (two) times daily. Qty: 15 capsule, Refills: 0      CONTINUE these medications which have CHANGED   Details  polyethylene glycol (MIRALAX) packet Take 17 g by mouth daily. Qty: 10 each, Refills: 0      STOP taking these medications     dicyclomine (BENTYL) 20 MG tablet       omeprazole (PRILOSEC) 20 MG capsule        No Known Allergies    The results of significant diagnostics from this hospitalization (including imaging, microbiology, ancillary and laboratory) are listed below for reference.    Significant Diagnostic Studies: Dg Abd 1 View  05/30/2015   CLINICAL DATA:  Generalized abdominal pain  EXAM: ABDOMEN - 1 VIEW  COMPARISON:  None.  FINDINGS: There is a moderate volume colonic stool, within the normal spectrum although somewhat generous. There is no bowel obstruction. There is no extraluminal air. No biliary or urinary calculi are evident.  IMPRESSION: Normal bowel gas pattern.  Generous colonic stool volume.   Electronically Signed   By: Andreas Newport M.D.   On: 05/30/2015 23:53    Microbiology: No results found for this or any previous visit (from the past 240 hour(s)).   Labs: Basic Metabolic Panel:  Recent Labs Lab 05/30/15 2105 05/30/15 2258 05/31/15 0503 05/31/15 1500  NA 139  --  137  --   K 2.4*  --  2.7* 3.2*  CL 100*  --  103  --   CO2 24  --  24  --   GLUCOSE 145*  --  172*  --   BUN 15  --  11  --   CREATININE 1.00  --  0.83  --   CALCIUM 9.5  --  8.5*  --   MG 2.0  --  1.9  --   PHOS  --  2.5 2.0*  --    Liver Function Tests:  Recent Labs Lab 05/30/15 2258 05/31/15 0503  AST 25 20  ALT 24 16  ALKPHOS 95 71  BILITOT 1.0 0.8  PROT 8.1 6.0*  ALBUMIN 4.4 3.2*   No results for input(s): LIPASE, AMYLASE in the last 168 hours. No results for input(s): AMMONIA in the last 168 hours. CBC:  Recent Labs Lab 05/30/15 2105 05/31/15 0503  WBC 6.0 5.4  NEUTROABS 4.3  --   HGB 13.6 11.1*  HCT 41.5 34.5*  MCV 77.9* 78.2  PLT 256 220   Cardiac Enzymes:  Recent Labs Lab 05/30/15 2105  CKTOTAL 476*   BNP: BNP (last 3 results) No results for input(s): BNP in the last 8760 hours.  ProBNP (last 3 results) No results for input(s): PROBNP in the last 8760 hours.  CBG:  Recent Labs Lab 05/31/15  05/31/15 0732 05/31/15 1129  GLUCAP 139* 145* 137*       Signed:  Sylina Henion  [facility] 05/31/2015, 3:26 PM

## 2015-06-06 ENCOUNTER — Encounter (HOSPITAL_COMMUNITY): Payer: Self-pay | Admitting: Emergency Medicine

## 2015-06-06 ENCOUNTER — Emergency Department (HOSPITAL_COMMUNITY): Payer: Self-pay

## 2015-06-06 ENCOUNTER — Emergency Department (HOSPITAL_COMMUNITY)
Admission: EM | Admit: 2015-06-06 | Discharge: 2015-06-06 | Disposition: A | Discharge status: Home / Self Care | Payer: Self-pay | Attending: Emergency Medicine | Admitting: Emergency Medicine

## 2015-06-06 DIAGNOSIS — E119 Type 2 diabetes mellitus without complications: Secondary | ICD-10-CM | POA: Insufficient documentation

## 2015-06-06 DIAGNOSIS — R51 Headache: Secondary | ICD-10-CM | POA: Insufficient documentation

## 2015-06-06 DIAGNOSIS — R55 Syncope and collapse: Secondary | ICD-10-CM | POA: Insufficient documentation

## 2015-06-06 DIAGNOSIS — R519 Headache, unspecified: Secondary | ICD-10-CM

## 2015-06-06 DIAGNOSIS — Z3202 Encounter for pregnancy test, result negative: Secondary | ICD-10-CM | POA: Insufficient documentation

## 2015-06-06 DIAGNOSIS — Z8659 Personal history of other mental and behavioral disorders: Secondary | ICD-10-CM | POA: Insufficient documentation

## 2015-06-06 LAB — URINALYSIS, ROUTINE W REFLEX MICROSCOPIC
Bilirubin Urine: NEGATIVE
Glucose, UA: NEGATIVE mg/dL
HGB URINE DIPSTICK: NEGATIVE
Ketones, ur: NEGATIVE mg/dL
Leukocytes, UA: NEGATIVE
Nitrite: NEGATIVE
PROTEIN: NEGATIVE mg/dL
Specific Gravity, Urine: 1.011 (ref 1.005–1.030)
Urobilinogen, UA: 0.2 mg/dL (ref 0.0–1.0)
pH: 5 (ref 5.0–8.0)

## 2015-06-06 LAB — BASIC METABOLIC PANEL
Anion gap: 9 (ref 5–15)
BUN: 15 mg/dL (ref 6–20)
CALCIUM: 8.8 mg/dL — AB (ref 8.9–10.3)
CO2: 22 mmol/L (ref 22–32)
CREATININE: 0.98 mg/dL (ref 0.44–1.00)
Chloride: 110 mmol/L (ref 101–111)
GFR calc Af Amer: >60 mL/min (ref >60)
GFR calc non Af Amer: >60 mL/min (ref >60)
Glucose, Bld: 155 mg/dL — ABNORMAL HIGH (ref 65–99)
POTASSIUM: 3.3 mmol/L — AB (ref 3.5–5.1)
SODIUM: 141 mmol/L (ref 135–145)

## 2015-06-06 LAB — CBC
HCT: 32.8 % — ABNORMAL LOW (ref 36.0–46.0)
HEMOGLOBIN: 10.4 g/dL — AB (ref 12.0–15.0)
MCH: 26.5 pg (ref 26.0–34.0)
MCHC: 31.7 g/dL (ref 30.0–36.0)
MCV: 83.5 fL (ref 78.0–100.0)
Platelets: 261 10*3/uL (ref 150–400)
RBC: 3.93 MIL/uL (ref 3.87–5.11)
RDW: 17.1 % — ABNORMAL HIGH (ref 11.5–15.5)
WBC: 5.5 10*3/uL (ref 4.0–10.5)

## 2015-06-06 LAB — I-STAT BETA HCG BLOOD, ED (MC, WL, AP ONLY)

## 2015-06-06 MED ORDER — DIPHENHYDRAMINE HCL 50 MG/ML IJ SOLN
25.0000 mg | Freq: Once | INTRAMUSCULAR | Status: AC
  Administered 2015-06-06: 25 mg via INTRAVENOUS
  Filled 2015-06-06: qty 1

## 2015-06-06 MED ORDER — SODIUM CHLORIDE 0.9 % IV BOLUS (SEPSIS)
1000.0000 mL | Freq: Once | INTRAVENOUS | Status: AC
  Administered 2015-06-06: 1000 mL via INTRAVENOUS

## 2015-06-06 MED ORDER — PROCHLORPERAZINE EDISYLATE 5 MG/ML IJ SOLN
10.0000 mg | Freq: Once | INTRAMUSCULAR | Status: AC
  Administered 2015-06-06: 10 mg via INTRAVENOUS
  Filled 2015-06-06: qty 2

## 2015-06-06 MED ORDER — IOHEXOL 350 MG/ML SOLN
100.0000 mL | Freq: Once | INTRAVENOUS | Status: AC | PRN
  Administered 2015-06-06: 100 mL via INTRAVENOUS

## 2015-06-06 MED ORDER — POTASSIUM CHLORIDE CRYS ER 20 MEQ PO TBCR
40.0000 meq | EXTENDED_RELEASE_TABLET | Freq: Once | ORAL | Status: AC
  Administered 2015-06-06: 40 meq via ORAL
  Filled 2015-06-06: qty 2

## 2015-06-06 NOTE — ED Notes (Signed)
RN DRAWING LABS 

## 2015-06-06 NOTE — Discharge Instructions (Signed)

## 2015-06-06 NOTE — ED Notes (Signed)
Pt to CT via stretcher

## 2015-06-06 NOTE — ED Notes (Signed)
Attempted IV x 2, unsuccessful,  IV order placed,  Pt states a little pain but unable to quantify

## 2015-06-06 NOTE — ED Notes (Signed)
Pt states she was outside waiting for the bus when she felt like she was going to pass out, and "it felt like (she) was going to die." States "I may have lost conciousness for maybe a second, I was sitting so I didn't fall over.When the feeling stopped I had a terrible headache afterwards that's continuing now." Denies N/V/D, chest pain, SOB.
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
## 2015-06-26 ENCOUNTER — Emergency Department (HOSPITAL_COMMUNITY)
Admission: EM | Admit: 2015-06-26 | Discharge: 2015-06-26 | Disposition: A | Discharge status: Home / Self Care | Payer: Self-pay | Attending: Emergency Medicine | Admitting: Emergency Medicine

## 2015-06-26 ENCOUNTER — Emergency Department (HOSPITAL_COMMUNITY): Payer: Self-pay

## 2015-06-26 DIAGNOSIS — R059 Cough, unspecified: Secondary | ICD-10-CM

## 2015-06-26 DIAGNOSIS — F439 Reaction to severe stress, unspecified: Secondary | ICD-10-CM | POA: Insufficient documentation

## 2015-06-26 DIAGNOSIS — J029 Acute pharyngitis, unspecified: Secondary | ICD-10-CM | POA: Insufficient documentation

## 2015-06-26 DIAGNOSIS — R0602 Shortness of breath: Secondary | ICD-10-CM | POA: Insufficient documentation

## 2015-06-26 DIAGNOSIS — J3489 Other specified disorders of nose and nasal sinuses: Secondary | ICD-10-CM | POA: Insufficient documentation

## 2015-06-26 DIAGNOSIS — R05 Cough: Secondary | ICD-10-CM | POA: Insufficient documentation

## 2015-06-26 DIAGNOSIS — E119 Type 2 diabetes mellitus without complications: Secondary | ICD-10-CM | POA: Insufficient documentation

## 2015-06-26 NOTE — ED Provider Notes (Signed)
CSN: [id number]     Arrival date & time 06/26/15  2022 History  This chart was scribed for non-physician practitioner, Lenard Galloway, working with Sherwood Gambler, MD by Evelene Croon, ED Scribe. This patient was seen in room WTR8/WTR8 and the patient's care was started at 9:50 PM.   Chief Complaint  Patient presents with  . Cough   The history is provided by the patient. No language interpreter was used.    HPI Comments:  [name] is a 49 y.o. female who presents to the Emergency Department complaining of mild SOB and persistent cough with occasional sputum for ~ 2 weeks. She reports associated congestion, rhinorrhea and sore throat. She denies a h/o asthma. No alleviating factors noted.  Pt has a h/o major depressive disorder. She states she is having a hard time. She reports frequent depression and previous SI, but denies SI currently. She denies auditory and visual hallucinations. When asked if she needs help with depression or with mental health issues today, she states she does not need help at this time.    Past Medical History  Diagnosis Date  . Anxiety   . Major depressive disorder, recurrent, severe with psychotic features   . Diabetes mellitus without complication    No past surgical history on file. Family History  Problem Relation Age of Onset  . Hypertension Other   . Diabetes Other   . Hypertension Mother   . Diabetes Mother   . Aneurysm Mother   . Hypertension Father   . Diabetes Father   . Hypertension Sister   . Diabetes Sister    Social History  Substance Use Topics  . Smoking status: Never Smoker   . Smokeless tobacco: Never Used  . Alcohol Use: Yes   OB History    No data available     Review of Systems  HENT: Positive for congestion, rhinorrhea and sore throat.   Respiratory: Positive for cough and shortness of breath.   Gastrointestinal: Negative for nausea and vomiting.  Musculoskeletal: Negative for myalgias and neck stiffness.   Skin: Negative for rash.  Psychiatric/Behavioral: Positive for dysphoric mood.   Allergies  Review of patient's allergies indicates no known allergies.  Home Medications   Prior to Admission medications   Medication Sig Start Date End Date Taking? Authorizing Provider  docusate sodium (COLACE) 100 MG capsule Take 1 capsule (100 mg total) by mouth 2 (two) times daily. Patient not taking: Reported on 06/06/2015 05/31/15   Nishant Dhungel, MD  polyethylene glycol (MIRALAX) packet Take 17 g by mouth daily. Patient not taking: Reported on 06/06/2015 05/31/15   Nishant Dhungel, MD   BP 127/75 mmHg  Pulse 79  Temp(Src) 98 F (36.7 C) (Oral)  Resp 18  SpO2 98%  LMP 12/25/2009 Physical Exam  Constitutional: She is oriented to person, place, and time. She appears well-developed and well-nourished. No distress.  HENT:  Head: Normocephalic and atraumatic.  Eyes: Conjunctivae are normal.  Neck: Normal range of motion. Neck supple.  Cardiovascular: Normal rate.   No murmur heard. Pulmonary/Chest: Effort normal. She has no wheezes. She has no rales. She exhibits no tenderness.  Abdominal: There is no tenderness.  Musculoskeletal: Normal range of motion. She exhibits no edema.  Neurological: She is alert and oriented to person, place, and time.  Skin: Skin is warm and dry.  Nursing note and vitals reviewed.   ED Course  Procedures   DIAGNOSTIC STUDIES:  Oxygen Saturation is 98% on RA, normal by my  interpretation.    COORDINATION OF CARE:  9:55 PM Discussed treatment plan with pt at bedside and pt agreed to plan.  Labs Review Labs Reviewed - No data to display  Imaging Review No results found. I have personally reviewed and evaluated these images and lab results as part of my medical decision-making.   EKG Interpretation None     Ct Angio Head W/cm &/or Wo Cm  06/06/2015   CLINICAL DATA:  Sudden onset of headache and syncope.  EXAM: CT ANGIOGRAPHY HEAD  TECHNIQUE:  Multidetector CT imaging of the head was performed using the standard protocol during bolus administration of intravenous contrast. Multiplanar CT image reconstructions and MIPs were obtained to evaluate the vascular anatomy.  CONTRAST:  128mL OMNIPAQUE IOHEXOL 350 MG/ML SOLN  COMPARISON:  CT without contrast 08/22/2010.  FINDINGS: CT HEAD  Brain: No acute infarct, hemorrhage, or mass lesion is present. The ventricles are of normal size. No significant extraaxial fluid collection is present.  Calvarium and skull base: Within normal limits.  Paranasal sinuses: Clear  Orbits: Within normal limits  CTA HEAD  Anterior circulation: The internal carotid arteries are within normal limits to the ICA termini bilaterally. A1 and M1 segments are within normal limits. The MCA bifurcations are intact. There is some attenuation of distal MCA branch vessels without significant proximal stenosis or occlusion.  Posterior circulation: New the right vertebral artery is the dominant vessel. The PICA origins are visualized and normal bilaterally. The basilar artery is small. The posterior cerebral arteries originate from the basilar tip and posterior communicating arteries bilaterally. There is mild narrowing of proximal left posterior cerebral artery. PCA branch vessels are intact with some attenuation of distal vessels bilaterally.  Venous sinuses: The dural sinuses are patent. Straight sinus and deep cerebral veins are intact. Cortical veins are within normal limits.  Anatomic variants: None  Delayed phase:The postcontrast images demonstrate no pathologic enhancement.  IMPRESSION: 1. Mild distal small vessel disease. 2. No significant proximal stenosis, aneurysm, or branch vessel occlusion. 3. Normal CT appearance of the brain.   Electronically Signed   By: San Morelle M.D.   On: 06/06/2015 21:29   Dg Chest 2 View  06/26/2015   CLINICAL DATA:  Acute onset of cough and shortness of breath. Initial encounter.  EXAM: CHEST   2 VIEW  COMPARISON:  Chest radiograph from 12/25/2012  FINDINGS: The lungs are well-aerated and clear. There is no evidence of focal opacification, pleural effusion or pneumothorax.  The heart is normal in size; the mediastinal contour is within normal limits. No acute osseous abnormalities are seen.  IMPRESSION: No acute cardiopulmonary process seen.   Electronically Signed   By: Garald Balding M.D.   On: 06/26/2015 22:27   Dg Abd 1 View  05/30/2015   CLINICAL DATA:  Generalized abdominal pain  EXAM: ABDOMEN - 1 VIEW  COMPARISON:  None.  FINDINGS: There is a moderate volume colonic stool, within the normal spectrum although somewhat generous. There is no bowel obstruction. There is no extraluminal air. No biliary or urinary calculi are evident.  IMPRESSION: Normal bowel gas pattern.  Generous colonic stool volume.   Electronically Signed   By: Andreas Newport M.D.   On: 05/30/2015 23:53    MDM   Final diagnoses:  None    1. Cough 2. Depression  She denies need for psychiatric assistance. She denies current SI, hallucinations. She is told she can return here any time if she feels she is getting worse or is  having suicidal thoughts.  No sign of pneumonia. She is well appearing. VSS. She is stable for discharge.   I personally performed the services described in this documentation, which was scribed in my presence. The recorded information has been reviewed and is accurate.     Charlann Lange, PA-C 06/27/15 [id number]  Sherwood Gambler, MD 06/30/15 [phone]

## 2015-06-26 NOTE — ED Notes (Signed)
Pt states that she has had a productive cough x 2 weeks. Alert and oriented.

## 2015-06-26 NOTE — Discharge Instructions (Signed)
Cough, Adult  A cough is a reflex that helps clear your throat and airways. It can help heal the body or may be a reaction to an irritated airway. A cough may only last 2 or 3 weeks (acute) or may last more than 8 weeks (chronic).  CAUSES Acute cough:  Viral or bacterial infections. Chronic cough:  Infections.  Allergies.  Asthma.  Post-nasal drip.  Smoking.  Heartburn or acid reflux.  Some medicines.  Chronic lung problems (COPD).  Cancer. SYMPTOMS   Cough.  Fever.  Chest pain.  Increased breathing rate.  High-pitched whistling sound when breathing (wheezing).  Colored mucus that you cough up (sputum). TREATMENT   A bacterial cough may be treated with antibiotic medicine.  A viral cough must run its course and will not respond to antibiotics.  Your caregiver may recommend other treatments if you have a chronic cough. HOME CARE INSTRUCTIONS   Only take over-the-counter or prescription medicines for pain, discomfort, or fever as directed by your caregiver. Use cough suppressants only as directed by your caregiver.  Use a cold steam vaporizer or humidifier in your bedroom or home to help loosen secretions.  Sleep in a semi-upright position if your cough is worse at night.  Rest as needed.  Stop smoking if you smoke. SEEK IMMEDIATE MEDICAL CARE IF:   You have pus in your sputum.  Your cough starts to worsen.  You cannot control your cough with suppressants and are losing sleep.  You begin coughing up blood.  You have difficulty breathing.  You develop pain which is getting worse or is uncontrolled with medicine.  You have a fever. MAKE SURE YOU:   Understand these instructions.  Will watch your condition.  Will get help right away if you are not doing well or get worse. Document Released: 04/11/2011 Document Revised: 01/05/2012 Document Reviewed: 04/11/2011 [facility] Patient Information 2015 [place], [place]. This information is not intended  to replace advice given to you by your health care provider. Make sure you discuss any questions you have with your health care provider. Stress Stress-related medical problems are becoming increasingly common. The body has a built-in physical response to stressful situations. Faced with pressure, challenge or danger, we need to react quickly. Our bodies release hormones such as cortisol and adrenaline to help do this. These hormones are part of the "fight or flight" response and affect the metabolic rate, heart rate and blood pressure, resulting in a heightened, stressed state that prepares the body for optimum performance in dealing with a stressful situation. It is likely that early man required these mechanisms to stay alive, but usually modern stresses do not call for this, and the same hormones released in today's world can damage health and reduce coping ability. CAUSES  Pressure to perform at work, at school or in sports.  Threats of physical violence.  Money worries.  Arguments.  Family conflicts.  Divorce or separation from significant other.  Bereavement.  New job or unemployment.  Changes in location.  Alcohol or drug abuse. SOMETIMES, THERE IS NO PARTICULAR REASON FOR DEVELOPING STRESS. Almost all people are at risk of being stressed at some time in their lives. It is important to know that some stress is temporary and some is long term.  Temporary stress will go away when a situation is resolved. Most people can cope with short periods of stress, and it can often be relieved by relaxing, taking a walk or getting any type of exercise, chatting through issues with  friends, or having a good night's sleep.  Chronic (long-term, continuous) stress is much harder to deal with. It can be psychologically and emotionally damaging. It can be harmful both for an individual and for friends and family. SYMPTOMS Everyone reacts to stress differently. There are some common effects that  help [name] recognize it. In times of extreme stress, people may:  Shake uncontrollably.  Breathe faster and deeper than normal (hyperventilate).  Vomit.  For people with asthma, stress can trigger an attack.  For some people, stress may trigger migraine headaches, ulcers, and body pain. PHYSICAL EFFECTS OF STRESS MAY INCLUDE:  Loss of energy.  Skin problems.  Aches and pains resulting from tense muscles, including neck ache, backache and tension headaches.  Increased pain from arthritis and other conditions.  Irregular heart beat (palpitations).  Periods of irritability or anger.  Apathy or depression.  Anxiety (feeling uptight or worrying).  Unusual behavior.  Loss of appetite.  Comfort eating.  Lack of concentration.  Loss of, or decreased, sex-drive.  Increased smoking, drinking, or recreational drug use.  For women, missed periods.  Ulcers, joint pain, and muscle pain. Post-traumatic stress is the stress caused by any serious accident, strong emotional damage, or extremely difficult or violent experience such as rape or war. Post-traumatic stress victims can experience mixtures of emotions such as fear, shame, depression, guilt or anger. It may include recurrent memories or images that may be haunting. These feelings can last for weeks, months or even years after the traumatic event that triggered them. Specialized treatment, possibly with medicines and psychological therapies, is available. If stress is causing physical symptoms, severe distress or making it difficult for you to function as normal, it is worth seeing your caregiver. It is important to remember that although stress is a usual part of life, extreme or prolonged stress can lead to other illnesses that will need treatment. It is better to visit a doctor sooner rather than later. Stress has been linked to the development of high blood pressure and heart disease, as well as insomnia and depression. There is  no diagnostic test for stress since everyone reacts to it differently. But a caregiver will be able to spot the physical symptoms, such as:  Headaches.  Shingles.  Ulcers. Emotional distress such as intense worry, low mood or irritability should be detected when the doctor asks pertinent questions to identify any underlying problems that might be the cause. In case there are physical reasons for the symptoms, the doctor may also want to do some tests to exclude certain conditions. If you feel that you are suffering from stress, try to identify the aspects of your life that are causing it. Sometimes you may not be able to change or avoid them, but even a small change can have a positive ripple effect. A simple lifestyle change can make all the difference. STRATEGIES THAT CAN HELP DEAL WITH STRESS:  Delegating or sharing responsibilities.  Avoiding confrontations.  Learning to be more assertive.  Regular exercise.  Avoid using alcohol or street drugs to cope.  Eating a healthy, balanced diet, rich in fruit and vegetables and proteins.  Finding humor or absurdity in stressful situations.  Never taking on more than you know you can handle comfortably.  Organizing your time better to get as much done as possible.  Talking to friends or family and sharing your thoughts and fears.  Listening to music or relaxation tapes.  Relaxation techniques like deep breathing, meditation, and yoga.  Tensing and  then relaxing your muscles, starting at the toes and working up to the head and neck. If you think that you would benefit from help, either in identifying the things that are causing your stress or in learning techniques to help you relax, see a caregiver who is capable of helping you with this. Rather than relying on medications, it is usually better to try and identify the things in your life that are causing stress and try to deal with them. There are many techniques of managing stress  including counseling, psychotherapy, aromatherapy, yoga, and exercise. Your caregiver can help you determine what is best for you. Document Released: 01/03/2003 Document Revised: 10/18/2013 Document Reviewed: 11/30/2007 [facility] Patient Information 2015 [place], [place]. This information is not intended to replace advice given to you by your health care provider. Make sure you discuss any questions you have with your health care provider.

## 2015-07-04 ENCOUNTER — Inpatient Hospital Stay
Admission: EM | Admit: 2015-07-04 | Discharge: 2015-07-05 | DRG: 885 | Disposition: A | Discharge status: Home / Self Care | Payer: No Typology Code available for payment source | Source: Other Acute Inpatient Hospital | Attending: Psychiatry | Admitting: Psychiatry

## 2015-07-04 DIAGNOSIS — Z6837 Body mass index (BMI) 37.0-37.9, adult: Secondary | ICD-10-CM

## 2015-07-04 DIAGNOSIS — Z590 Homelessness: Secondary | ICD-10-CM | POA: Diagnosis not present

## 2015-07-04 DIAGNOSIS — I10 Essential (primary) hypertension: Secondary | ICD-10-CM | POA: Diagnosis present

## 2015-07-04 DIAGNOSIS — K5900 Constipation, unspecified: Secondary | ICD-10-CM | POA: Diagnosis present

## 2015-07-04 DIAGNOSIS — E119 Type 2 diabetes mellitus without complications: Secondary | ICD-10-CM | POA: Diagnosis present

## 2015-07-04 DIAGNOSIS — Z8249 Family history of ischemic heart disease and other diseases of the circulatory system: Secondary | ICD-10-CM

## 2015-07-04 DIAGNOSIS — E876 Hypokalemia: Secondary | ICD-10-CM | POA: Diagnosis present

## 2015-07-04 DIAGNOSIS — F22 Delusional disorders: Principal | ICD-10-CM | POA: Diagnosis present

## 2015-07-04 DIAGNOSIS — Z833 Family history of diabetes mellitus: Secondary | ICD-10-CM | POA: Diagnosis not present

## 2015-07-04 DIAGNOSIS — F17200 Nicotine dependence, unspecified, uncomplicated: Secondary | ICD-10-CM | POA: Diagnosis present

## 2015-07-04 DIAGNOSIS — F209 Schizophrenia, unspecified: Secondary | ICD-10-CM | POA: Diagnosis present

## 2015-07-04 DIAGNOSIS — E440 Moderate protein-calorie malnutrition: Secondary | ICD-10-CM | POA: Diagnosis present

## 2015-07-04 LAB — URINE DRUG SCREEN, QUALITATIVE (ARMC ONLY)
AMPHETAMINES, UR SCREEN: NOT DETECTED
BENZODIAZEPINE, UR SCRN: POSITIVE — AB
Barbiturates, Ur Screen: NOT DETECTED
Cannabinoid 50 Ng, Ur ~~LOC~~: NOT DETECTED
Cocaine Metabolite,Ur ~~LOC~~: NOT DETECTED
MDMA (Ecstasy)Ur Screen: NOT DETECTED
Methadone Scn, Ur: NOT DETECTED
Opiate, Ur Screen: NOT DETECTED
PHENCYCLIDINE (PCP) UR S: NOT DETECTED
Tricyclic, Ur Screen: NOT DETECTED

## 2015-07-04 LAB — URINALYSIS COMPLETE WITH MICROSCOPIC (ARMC ONLY)
BACTERIA UA: NONE SEEN
Bilirubin Urine: NEGATIVE
Glucose, UA: NEGATIVE mg/dL
Hgb urine dipstick: NEGATIVE
Ketones, ur: NEGATIVE mg/dL
NITRITE: NEGATIVE
PROTEIN: NEGATIVE mg/dL
SPECIFIC GRAVITY, URINE: 1.025 (ref 1.005–1.030)
pH: 5 (ref 5.0–8.0)

## 2015-07-04 LAB — PREGNANCY, URINE: PREG TEST UR: NEGATIVE

## 2015-07-04 MED ORDER — RISPERIDONE 1 MG PO TABS
1.0000 mg | ORAL_TABLET | Freq: Every day | ORAL | Status: DC
  Administered 2015-07-04: 1 mg via ORAL
  Filled 2015-07-04: qty 1

## 2015-07-04 MED ORDER — NICOTINE POLACRILEX 2 MG MT GUM: 2.0000 mg | CHEWING_GUM | OROMUCOSAL | Status: DC | PRN

## 2015-07-04 MED ORDER — TRAZODONE HCL 100 MG PO TABS
100.0000 mg | ORAL_TABLET | Freq: Every day | ORAL | Status: DC
  Administered 2015-07-04: 100 mg via ORAL
  Filled 2015-07-04: qty 1

## 2015-07-04 MED ORDER — ALUM & MAG HYDROXIDE-SIMETH 200-200-20 MG/5ML PO SUSP: 30.0000 mL | ORAL | Status: DC | PRN

## 2015-07-04 MED ORDER — MAGNESIUM HYDROXIDE 400 MG/5ML PO SUSP: 30.0000 mL | Freq: Every day | ORAL | Status: DC | PRN

## 2015-07-04 MED ORDER — ACETAMINOPHEN 325 MG PO TABS: 650.0000 mg | ORAL_TABLET | Freq: Four times a day (QID) | ORAL | Status: DC | PRN

## 2015-07-05 DIAGNOSIS — F22 Delusional disorders: Principal | ICD-10-CM

## 2015-07-05 DIAGNOSIS — F17200 Nicotine dependence, unspecified, uncomplicated: Secondary | ICD-10-CM | POA: Diagnosis present

## 2015-07-05 LAB — LIPID PANEL
Cholesterol: 223 mg/dL — ABNORMAL HIGH (ref 0–200)
HDL: 51 mg/dL (ref >40)
LDL CALC: 150 mg/dL — AB (ref 0–99)
Total CHOL/HDL Ratio: 4.4 RATIO
Triglycerides: 109 mg/dL (ref <150)
VLDL: 22 mg/dL (ref 0–40)

## 2015-07-05 LAB — COMPREHENSIVE METABOLIC PANEL
ALBUMIN: 3.9 g/dL (ref 3.5–5.0)
ALK PHOS: 97 U/L (ref 38–126)
ALT: 14 U/L (ref 14–54)
ANION GAP: 7 (ref 5–15)
AST: 18 U/L (ref 15–41)
BILIRUBIN TOTAL: 0.4 mg/dL (ref 0.3–1.2)
BUN: 12 mg/dL (ref 6–20)
CALCIUM: 9 mg/dL (ref 8.9–10.3)
CO2: 24 mmol/L (ref 22–32)
CREATININE: 0.8 mg/dL (ref 0.44–1.00)
Chloride: 111 mmol/L (ref 101–111)
GFR calc Af Amer: >60 mL/min (ref >60)
GFR calc non Af Amer: >60 mL/min (ref >60)
GLUCOSE: 110 mg/dL — AB (ref 65–99)
Potassium: 3.9 mmol/L (ref 3.5–5.1)
SODIUM: 142 mmol/L (ref 135–145)
TOTAL PROTEIN: 6.7 g/dL (ref 6.5–8.1)

## 2015-07-05 LAB — CBC
HEMATOCRIT: 35 % (ref 35.0–47.0)
HEMOGLOBIN: 11.1 g/dL — AB (ref 12.0–16.0)
MCH: 26.1 pg (ref 26.0–34.0)
MCHC: 31.9 g/dL — ABNORMAL LOW (ref 32.0–36.0)
MCV: 81.9 fL (ref 80.0–100.0)
Platelets: 246 10*3/uL (ref 150–440)
RBC: 4.27 MIL/uL (ref 3.80–5.20)
RDW: 16.5 % — AB (ref 11.5–14.5)
WBC: 4.2 10*3/uL (ref 3.6–11.0)

## 2015-07-05 LAB — TSH: TSH: 2.393 u[IU]/mL (ref 0.350–4.500)

## 2015-07-05 LAB — HEMOGLOBIN A1C: Hgb A1c MFr Bld: 6.1 % — ABNORMAL HIGH (ref 4.0–6.0)

## 2015-07-05 LAB — ETHANOL

## 2015-07-05 MED ORDER — RISPERIDONE 1 MG PO TABS: 1.0000 mg | ORAL_TABLET | Freq: Every day | ORAL | Status: DC

## 2015-07-05 MED ORDER — TRAZODONE HCL 100 MG PO TABS: 100.0000 mg | ORAL_TABLET | Freq: Every day | ORAL | Status: DC

## 2015-07-05 NOTE — Tx Team (Signed)
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     Aggression and Anger 07/04/2015                                          DISCHARGE CRITERIA:  Ability to meet basic life and health needs Adequate post-discharge living arrangements Improved stabilization in mood, thinking, and/or behavior Motivation to continue treatment in a less acute level of care Need for constant or close observation no longer present Reduction of life-threatening or endangering symptoms to within safe limits Verbal commitment to aftercare and medication compliance  PRELIMINARY DISCHARGE PLAN: Attend aftercare/continuing care group Outpatient therapy Placement in alternative living arrangements Return to previous work or school arrangements  PATIENT/FAMIILY INVOLVEMENT: This treatment plan has been presented to and reviewed with the patient, [name].  The patient have been given the opportunity to ask questions and make suggestions.  Christee Mervine T Greyson Riccardi 07/05/2015, 2:43 AM

## 2015-07-05 NOTE — [facility] Group Notes (Signed)
[facility] Group Notes:  (Nursing/MHT/Case Management/Adjunct)  Date:  07/05/2015  Time:  2:26 PM  Type of Therapy:  Group Therapy  Participation Level:  Did Not Attend  Participation Quality:Summary of Progress/Problems:  [name] 07/05/2015, 2:26 PM

## 2015-07-05 NOTE — Progress Notes (Signed)
Nursing staffs will do the following: Active listening & Diversional activities including engaging and allow time to verbalized feelings; Frequent verbal contacts to assess state of mind; Medicate--use PRNs timely; Provide reassurance and support SW Consult initiated (Homelessness) Last hospitalized in 2013 per chart review Evasive about family support and initially waived Confidentiality rights i.e. Information regarding this admission, the room or any information not to be released by [facility] staffs; but decided otherwise, when she understands that the password established therein now can only be giving out the incoming caller by her. UDS negative for illicit drug except Benzodiazepine, negative for pregnancy test

## 2015-07-05 NOTE — Discharge Summary (Signed)
Physician Discharge Summary Note  Patient:  [name] is an 49 y.o., female MRN:  [id number] DOB:  [birth year] Patient phone:  [phone] (home)  Patient address:   [place] [address],  Total Time spent with patient: 1 hour  Date of Admission:  07/04/2015 Date of Discharge: 07/05/2015  Reason for Admission:  Paranoid delusions.  Identifying data. [name] is a 49 year old female with a history of delusional disorder.  Chief complaint. "He called Dr. Jimmye Norman."  History of present illness. [name] was brought to [facility] from the crisis center at [facility]. In the morning she went again to Police Department spoke with one of the detectives complaining of harassment that she is been an since 2008 from her old coworkers. She is unable to explain how this harassment happens. There are no phone calls for social need to get involved. Instead she believes that certain persons whom she would not name use some sophisticated electronic equipment. She has been calling police for years. She believes that 2 detectives are now involved her case, will call Dr. Jimmye Norman has a master's advisor to contact her stories. She is also in contact with the group in [place] but promises to get her through [place]. She feels offended when I point out to her that this likely is delusional and that she needs psychiatric treatment. She refuses to take any psychiatric medications. She denies any symptoms of depression, anxiety, or psychosis. She denies symptoms suggestive of bipolar mania. She denies alcohol or substance use.  Past psychiatric history. She has never been hospitalized. She never attempted this suicide. She has never taken any psychotherapy medications.  Family psychiatric history. The patient denies any.  Social history. She tells me that she has 2 masters degrees. She was employed at [employer] in the past. She tells me that  she isn't currently employed but would not disclose her employer in fear that I would call her employer. We talk about he rules but she seems suspicious and paranoid. She lives independently.   Principal Problem: Delusional disorder Discharge Diagnoses: Patient Active Problem List   Diagnosis Date Noted  . Tobacco use disorder [Z72.0] 07/05/2015  . Malnutrition of moderate degree [E44.0] 05/31/2015  . Hypokalemia [E87.6] 05/30/2015  . Obstipation [K59.00] 05/30/2015  . Prolonged QT interval [I45.81] 05/30/2015  . Hypertension [I10] 05/30/2015  . Adjustment disorder with mixed disturbance of emotions and conduct [F43.25] 04/15/2015  . 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 Suicidal ideations [R45.851] 03/12/2015  . Diabetes [E11.9] 09/06/2012  . Delusional disorder [F22] 04/16/2012    Musculoskeletal: Strength & Muscle Tone: within normal limits Gait & Station: normal Patient leans: N/A  Psychiatric Specialty Exam: Physical Exam  Nursing note and vitals reviewed.   Review of Systems  All other systems reviewed and are negative.   Blood pressure 112/81, pulse 84, temperature 98 F (36.7 C), temperature source Oral, resp. rate 18, height '5\' 6"'  (1.676 m), weight 105.688 kg (233 lb), last menstrual period 12/25/2009, SpO2 98 %.Body mass index is 37.63 kg/(m^2).  See SRA.                                                  Sleep:  Number of Hours: 4.3   Have you used any form of tobacco  in the last 30 days? (Cigarettes, Smokeless Tobacco, Cigars, and/or Pipes): No  Has this patient used any form of tobacco in the last 30 days? (Cigarettes, Smokeless Tobacco, Cigars, and/or Pipes) No  Past Medical History:  Past Medical History  Diagnosis Date  . Anxiety   . Major depressive disorder, recurrent, severe with psychotic features   . Diabetes mellitus without complication    History reviewed. No pertinent past surgical  history. Family History:  Family History  Problem Relation Age of Onset  . Hypertension Other   . Diabetes Other   . Hypertension Mother   . Diabetes Mother   . Aneurysm Mother   . Hypertension Father   . Diabetes Father   . Hypertension Sister   . Diabetes Sister    Social History:  History  Alcohol Use  . Yes     History  Drug Use No    Social History   Social History  . Marital Status: Single    Spouse Name: N/A  . Number of Children: N/A  . Years of Education: N/A   Social History Main Topics  . Smoking status: Never Smoker   . Smokeless tobacco: Never Used  . Alcohol Use: Yes  . Drug Use: No  . Sexual Activity: Not Asked   Other Topics Concern  . None   Social History Narrative    Past Psychiatric History: Hospitalizations:  Outpatient Care:  Substance Abuse Care:  Self-Mutilation:  Suicidal Attempts:  Violent Behaviors:   Risk to Self: Is patient at risk for suicide?: No Risk to Others:   Prior Inpatient Therapy:   Prior Outpatient Therapy:    Level of Care:  OP  Hospital Course:    [name] is a 49 year old female with a history of delusional disorder for the past 8 years admitted for harassing the police.  1. Psychosis. The patient has very little insight into her problems. She is hurt when told that she is delusional. She refuses any medications in the hospital or in the community. She was started on 1 mg Risperdal.  2. Insomnia. She was offered trazodone.   3. Disposition. The patient will be discharged to home today. She will follow up with [facility].   Consults:  None  Significant Diagnostic Studies:  None  Discharge Vitals:   Blood pressure 112/81, pulse 84, temperature 98 F (36.7 C), temperature source Oral, resp. rate 18, height '5\' 6"'  (1.676 m), weight 105.688 kg (233 lb), last menstrual period 12/25/2009, SpO2 98 %. Body mass index is 37.63 kg/(m^2). Lab Results:   Results for orders placed or performed during the hospital  encounter of 07/04/15 (from the past 72 hour(s))  Urinalysis complete, with microscopic ([facility] only)     Status: Abnormal   Collection Time: 07/04/15  8:50 PM  Result Value Ref Range   Color, Urine YELLOW (A) YELLOW   APPearance CLEAR (A) CLEAR   Glucose, UA NEGATIVE NEGATIVE mg/dL   Bilirubin Urine NEGATIVE NEGATIVE   Ketones, ur NEGATIVE NEGATIVE mg/dL   Specific Gravity, Urine 1.025 1.005 - 1.030   Hgb urine dipstick NEGATIVE NEGATIVE   pH 5.0 5.0 - 8.0   Protein, ur NEGATIVE NEGATIVE mg/dL   Nitrite NEGATIVE NEGATIVE   Leukocytes, UA TRACE (A) NEGATIVE   RBC / HPF 0-5 0 - 5 RBC/hpf   WBC, UA 0-5 0 - 5 WBC/hpf   Bacteria, UA NONE SEEN NONE SEEN   Squamous Epithelial / LPF 0-5 (A) NONE SEEN   Mucous PRESENT  Pregnancy, urine     Status: None   Collection Time: 07/04/15  8:50 PM  Result Value Ref Range   Preg Test, Ur NEGATIVE NEGATIVE  Urine Drug Screen, Qualitative ([facility] only)     Status: Abnormal   Collection Time: 07/04/15  8:50 PM  Result Value Ref Range   Tricyclic, Ur Screen NONE DETECTED NONE DETECTED   Amphetamines, Ur Screen NONE DETECTED NONE DETECTED   MDMA (Ecstasy)Ur Screen NONE DETECTED NONE DETECTED   Cocaine Metabolite,Ur [place] NONE DETECTED NONE DETECTED   Opiate, Ur Screen NONE DETECTED NONE DETECTED   Phencyclidine (PCP) Ur S NONE DETECTED NONE DETECTED   Cannabinoid 50 Ng, Ur  NONE DETECTED NONE DETECTED   Barbiturates, Ur Screen NONE DETECTED NONE DETECTED   Benzodiazepine, Ur Scrn POSITIVE (A) NONE DETECTED   Methadone Scn, Ur NONE DETECTED NONE DETECTED    Comment: (NOTE) 128  Tricyclics, urine               Cutoff 1000 ng/mL 200  Amphetamines, urine             Cutoff 1000 ng/mL 300  MDMA (Ecstasy), urine           Cutoff 500 ng/mL 400  Cocaine Metabolite, urine       Cutoff 300 ng/mL 500  Opiate, urine                   Cutoff 300 ng/mL 600  Phencyclidine (PCP), urine      Cutoff 25 ng/mL 700  Cannabinoid, urine              Cutoff 50 ng/mL 800   Barbiturates, urine             Cutoff 200 ng/mL 900  Benzodiazepine, urine           Cutoff 200 ng/mL 1000 Methadone, urine                Cutoff 300 ng/mL 1100 1200 The urine drug screen provides only a preliminary, unconfirmed 1300 analytical test result and should not be used for non-medical 1400 purposes. Clinical consideration and professional judgment should 1500 be applied to any positive drug screen result due to possible 1600 interfering substances. A more specific alternate chemical method 1700 must be used in order to obtain a confirmed analytical result.  1800 Gas chromato graphy / mass spectrometry (GC/MS) is the preferred 1900 confirmatory method.   CBC     Status: Abnormal   Collection Time: 07/05/15  6:14 AM  Result Value Ref Range   WBC 4.2 3.6 - 11.0 K/uL   RBC 4.27 3.80 - 5.20 MIL/uL   Hemoglobin 11.1 (L) 12.0 - 16.0 g/dL   HCT 35.0 35.0 - 47.0 %   MCV 81.9 80.0 - 100.0 fL   MCH 26.1 26.0 - 34.0 pg   MCHC 31.9 (L) 32.0 - 36.0 g/dL   RDW 16.5 (H) 11.5 - 14.5 %   Platelets 246 150 - 440 K/uL  Comprehensive metabolic panel     Status: Abnormal   Collection Time: 07/05/15  6:14 AM  Result Value Ref Range   Sodium 142 135 - 145 mmol/L   Potassium 3.9 3.5 - 5.1 mmol/L   Chloride 111 101 - 111 mmol/L   CO2 24 22 - 32 mmol/L   Glucose, Bld 110 (H) 65 - 99 mg/dL   BUN 12 6 - 20 mg/dL   Creatinine, Ser 0.80 0.44 - 1.00 mg/dL   Calcium  9.0 8.9 - 10.3 mg/dL   Total Protein 6.7 6.5 - 8.1 g/dL   Albumin 3.9 3.5 - 5.0 g/dL   AST 18 15 - 41 U/L   ALT 14 14 - 54 U/L   Alkaline Phosphatase 97 38 - 126 U/L   Total Bilirubin 0.4 0.3 - 1.2 mg/dL   GFR calc non Af Amer >60 >60 mL/min   GFR calc Af Amer >60 >60 mL/min    Comment: (NOTE) The eGFR has been calculated using the CKD EPI equation. This calculation has not been validated in all clinical situations. eGFR's persistently <60 mL/min signify possible Chronic Kidney Disease.    Anion gap 7 5 - 15  Lipid panel,  fasting     Status: Abnormal   Collection Time: 07/05/15  6:14 AM  Result Value Ref Range   Cholesterol 223 (H) 0 - 200 mg/dL   Triglycerides 109 <150 mg/dL   HDL 51 >40 mg/dL   Total CHOL/HDL Ratio 4.4 RATIO   VLDL 22 0 - 40 mg/dL   LDL Cholesterol 150 (H) 0 - 99 mg/dL    Comment:        Total Cholesterol/HDL:CHD Risk Coronary Heart Disease Risk Table                     Men   Women  1/2 Average Risk   3.4   3.3  Average Risk       5.0   4.4  2 X Average Risk   9.6   7.1  3 X Average Risk  23.4   11.0        Use the calculated Patient Ratio above and the CHD Risk Table to determine the patient's CHD Risk.        ATP III CLASSIFICATION (LDL):  <100     mg/dL   Optimal  100-129  mg/dL   Near or Above                    Optimal  130-159  mg/dL   Borderline  160-189  mg/dL   High  >190     mg/dL   Very High   Ethanol     Status: None   Collection Time: 07/05/15  6:14 AM  Result Value Ref Range   Alcohol, Ethyl (B) <5 <5 mg/dL    Comment:        LOWEST DETECTABLE LIMIT FOR SERUM ALCOHOL IS 5 mg/dL FOR MEDICAL PURPOSES ONLY   TSH     Status: None   Collection Time: 07/05/15  6:14 AM  Result Value Ref Range   TSH 2.393 0.350 - 4.500 uIU/mL    Physical Findings: AIMS: Facial and Oral Movements Muscles of Facial Expression: None, normal Lips and Perioral Area: None, normal Jaw: None, normal Tongue: None, normal,Extremity Movements Upper (arms, wrists, hands, fingers): None, normal Lower (legs, knees, ankles, toes): None, normal, Trunk Movements Neck, shoulders, hips: None, normal, Overall Severity Severity of abnormal movements (highest score from questions above): None, normal Incapacitation due to abnormal movements: None, normal Patient's awareness of abnormal movements (rate only patient's report): No Awareness, Dental Status Current problems with teeth and/or dentures?: No Does patient usually wear dentures?: No  CIWA:  CIWA-Ar Total: 0 COWS:  COWS Total  Score: 0   See Psychiatric Specialty Exam and Suicide Risk Assessment completed by Attending Physician prior to discharge.  Discharge destination:  Home  Is patient on multiple antipsychotic therapies at discharge:  No  Has Patient had three or more failed trials of antipsychotic monotherapy by history:  No    Recommended Plan for Multiple Antipsychotic Therapies: NA  Discharge Instructions    Diet - low sodium heart healthy    Complete by:  As directed      Increase activity slowly    Complete by:  As directed             Medication List    TAKE these medications      Indication   docusate sodium 100 MG capsule  Commonly known as:  COLACE  Take 1 capsule (100 mg total) by mouth 2 (two) times daily.      polyethylene glycol packet  Commonly known as:  MIRALAX  Take 17 g by mouth daily.      risperiDONE 1 MG tablet  Commonly known as:  RISPERDAL  Take 1 tablet (1 mg total) by mouth at bedtime.   Indication:  Psychosis     traZODone 100 MG tablet  Commonly known as:  DESYREL  Take 1 tablet (100 mg total) by mouth at bedtime.   Indication:  Trouble Sleeping         Follow-up recommendations:  Activity:  As tolerated. Diet:  Low-sodium heart healthy. Other:  Keep follow-up appointments.  Comments:    Total Discharge Time:   Signed: Orson Slick 07/05/2015, 11:12 AM

## 2015-07-05 NOTE — BHH Suicide Risk Assessment (Signed)
[facility] Admission Suicide Risk Assessment   Nursing information obtained from:    Demographic factors:    Current Mental Status:    Loss Factors:    Historical Factors:    Risk Reduction Factors:    Total Time spent with patient: 1 hour Principal Problem: Delusional disorder Diagnosis:   Patient Active Problem List   Diagnosis Date Noted  . Tobacco use disorder [Z72.0] 07/05/2015  . Malnutrition of moderate degree [E44.0] 05/31/2015  . Hypokalemia [E87.6] 05/30/2015  . Obstipation [K59.00] 05/30/2015  . Prolonged QT interval [I45.81] 05/30/2015  . Hypertension [I10] 05/30/2015  . Adjustment disorder with mixed disturbance of emotions and conduct [F43.25] 04/15/2015  . 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 Suicidal ideations [R45.851] 03/12/2015  . Diabetes [E11.9] 09/06/2012  . Delusional disorder [F22] 04/16/2012     Continued Clinical Symptoms:  Alcohol Use Disorder Identification Test Final Score (AUDIT): 0 The "Alcohol Use Disorders Identification Test", Guidelines for Use in Primary Care, Second Edition.  World Pharmacologist [facility]). Score between 0-7:  no or low risk or alcohol related problems. Score between 8-15:  moderate risk of alcohol related problems. Score between 16-19:  high risk of alcohol related problems. Score 20 or above:  warrants further diagnostic evaluation for alcohol dependence and treatment.   CLINICAL FACTORS:   Currently Psychotic   Musculoskeletal: Strength & Muscle Tone: within normal limits Gait & Station: normal Patient leans: N/A  Psychiatric Specialty Exam: Physical Exam  Nursing note and vitals reviewed. Constitutional: She is oriented to person, place, and time. She appears well-developed and well-nourished.  HENT:  Head: Normocephalic and atraumatic.  Eyes: Conjunctivae and EOM are normal. Pupils are equal, round, and reactive to light.  Neck: Normal range of motion. Neck supple.   Cardiovascular: Normal rate, regular rhythm and normal heart sounds.   Respiratory: Effort normal and breath sounds normal.  GI: Soft. Bowel sounds are normal.  Musculoskeletal: Normal range of motion.  Neurological: She is alert and oriented to person, place, and time.  Skin: Skin is warm and dry.    Review of Systems  All other systems reviewed and are negative.   Blood pressure 112/81, pulse 84, temperature 98 F (36.7 C), temperature source Oral, resp. rate 18, height 5\' 6"  (1.676 m), weight 105.688 kg (233 lb), last menstrual period 12/25/2009, SpO2 98 %.Body mass index is 37.63 kg/(m^2).  General Appearance: Casual  Eye Contact::  Good  Speech:  Clear and Coherent  Volume:  Normal  Mood:  Euthymic  Affect:  Appropriate  Thought Process:  Influenced by delusions  Orientation:  Full (Time, Place, and Person)  Thought Content:  Delusions  Suicidal Thoughts:  No  Homicidal Thoughts:  No  Memory:  Immediate;   Good Recent;   Good Remote;   Good  Judgement:  Impaired  Insight:  Lacking  Psychomotor Activity:  Normal  Concentration:  Good  Recall:  Good  Fund of Knowledge:Good  Language: Good  Akathisia:  No  Handed:  Right  AIMS (if indicated):     Assets:  Communication Skills Housing  Sleep:  Number of Hours: 4.3  Cognition: WNL  ADL's:  Intact   COGNITIVE FEATURES THAT CONTRIBUTE TO RISK:  Closed-mindedness    SUICIDE RISK:   Minimal: No identifiable suicidal ideation.  Patients presenting with no risk factors but with morbid ruminations; may be classified as minimal risk based on the severity of the depressive symptoms  PLAN OF CARE: Hospital admission, medication  management, discharge planning.  Medical Decision Making:  New problem, with additional work up planned, Review of Psycho-Social Stressors (1), Review or order clinical lab tests (1), Review of Medication Regimen & Side Effects (2) and Review of New Medication or Change in Dosage (2)   Ms. Tyer is  a 49 year old female with a history of delusional disorder for the past 12 years admitted for harassing the police.  1 psychosis. The patient has very little insight into her problems. She is hurt when told that she is delusional. She refuses any medications in the hospital or in the community.  2. Disposition. The patient will be discharged to home today. She will follow up with [facility].    I certify that inpatient services furnished can reasonably be expected to improve the patient's condition.   Levora Werden 07/05/2015, 10:44 AM

## 2015-07-05 NOTE — BHH Counselor (Signed)
CSW was unable to complete assessment. Pt was discharged within 24 of admission to BMU.   [place] MSW, [place]  07/05/2015 1:07 PM

## 2015-07-05 NOTE — BHH Suicide Risk Assessment (Signed)
[facility] Discharge Suicide Risk Assessment   Demographic Factors:  Living alone  Total Time spent with patient: 1 hour  Musculoskeletal: Strength & Muscle Tone: within normal limits Gait & Station: normal Patient leans: N/A  Psychiatric Specialty Exam: Physical Exam  Nursing note and vitals reviewed.   Review of Systems  All other systems reviewed and are negative.   Blood pressure 112/81, pulse 84, temperature 98 F (36.7 C), temperature source Oral, resp. rate 18, height 5\' 6"  (1.676 m), weight 105.688 kg (233 lb), last menstrual period 12/25/2009, SpO2 98 %.Body mass index is 37.63 kg/(m^2).  See SRA.                                              Sleep:  Number of Hours: 4.3  Cognition: WNL  ADL's:  Intact   Have you used any form of tobacco in the last 30 days? (Cigarettes, Smokeless Tobacco, Cigars, and/or Pipes): No  Has this patient used any form of tobacco in the last 30 days? (Cigarettes, Smokeless Tobacco, Cigars, and/or Pipes) No  Mental Status Per Nursing Assessment::   On Admission:     Current Mental Status by Physician: NA  Loss Factors: NA  Historical Factors: NA  Risk Reduction Factors:   Employed  Continued Clinical Symptoms:  Currently Psychotic  Cognitive Features That Contribute To Risk:  Closed-mindedness    Suicide Risk:  Minimal: No identifiable suicidal ideation.  Patients presenting with no risk factors but with morbid ruminations; may be classified as minimal risk based on the severity of the depressive symptoms  Principal Problem: Delusional disorder Discharge Diagnoses:  Patient Active Problem List   Diagnosis Date Noted  . Tobacco use disorder [Z72.0] 07/05/2015  . Malnutrition of moderate degree [E44.0] 05/31/2015  . Hypokalemia [E87.6] 05/30/2015  . Obstipation [K59.00] 05/30/2015  . Prolonged QT interval [I45.81] 05/30/2015  . Hypertension [I10] 05/30/2015  . Adjustment disorder with mixed disturbance of  emotions and conduct [F43.25] 04/15/2015  . 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 Suicidal ideations [R45.851] 03/12/2015  . Diabetes [E11.9] 09/06/2012  . Delusional disorder [F22] 04/16/2012      Plan Of Care/Follow-up recommendations:  Activity:  As tolerated. Diet:  heart healthy. Other:  Keep follow-up appointments.  Is patient on multiple antipsychotic therapies at discharge:  No   Has Patient had three or more failed trials of antipsychotic monotherapy by history:  No  Recommended Plan for Multiple Antipsychotic Therapies: NA    [name] 07/05/2015, 11:07 AM

## 2015-07-05 NOTE — Progress Notes (Signed)
  [facility] Adult Case Management Discharge Plan :  Will you be returning to the same living situation after discharge:  Yes,  Home with family  At discharge, do you have transportation home?: Yes,  bus Do you have the ability to pay for your medications: Yes,  Mental health   Release of information consent forms completed and in the chart;  Patient's signature needed at discharge.  Patient to Follow up at: Follow-up Information    Follow up with [facility] . Go on 07/09/2015.   Why:  Please go to the walk in clinc for your hospital follow up appointment. Monday- Friday between 8:00am and 4:00pm. Please take all of your hospital paperwork.    Contact information:   [address], [place], [place] [address] Phone: [phone]      Patient denies SI/HI: Yes,  Yes    Safety Planning and Suicide Prevention discussed: Yes,  With patient   Have you used any form of tobacco in the last 30 days? (Cigarettes, Smokeless Tobacco, Cigars, and/or Pipes): No  Has patient been referred to the Quitline?: N/A patient is not a smoker  [place] MSW, [place]  07/05/2015, 1:07 PM

## 2015-07-05 NOTE — BHH Suicide Risk Assessment (Signed)
[facility] INPATIENT:  Family/Significant Other Suicide Prevention Education  Suicide Prevention Education:  Patient Refusal for Family/Significant Other Suicide Prevention Education: The patient [name] has refused to provide written consent for family/significant other to be provided Family/Significant Other Suicide Prevention Education during admission and/or prior to discharge.  Physician notified.  SPE completed with pt and she was encouraged to share information provided in SPI pamphlet with support network, ask questions, and talk about any concerns relating to SPE. Pt denies SI/HI/AVH and verbalized understanding of information presented.    [place] MSW, [place]  07/05/2015, 1:08 PM

## 2015-07-05 NOTE — Progress Notes (Signed)
Medications administered as ordered by the physician, no PRN given, 15 minute checks maintained for safety, clinical and moral support provided, patient encouraged to continue to express feelings and demonstrate safe care. Patient remain free from harm, will continue to monitor.

## 2015-07-05 NOTE — Progress Notes (Signed)
Arrived to unit from [facility] in the custody of Law Enforcement accompanied by hospital nursing staff; patient searched by 2 nursing staffs on admission for contrabands to ensure a safe and therapeutic milieu, no paraphilia or any other contrabands found on patient or in the belongings. Skin Assessment completed and documented, gross skin intact, no open wounds. [name] is 49 yo AAF, with h/o CPS-Chronic Paranoid Schizophrenia/Delusional d/o. Homeless, unemployed, having ongoing current persecutory delusions and dangerously paranoid about these "2 individual harassing me in the [employer] where I work as Building control surveyor for [employer], I have 2 Masters degrees in [place], I have been repeatedly Surveyor, [employer] [place] to provide ongoing harrassment complaints against these 2 individual; Officer said that I am having information Overload and advised that I should talk to someone to help me, so, I went to [place] and they brought me here." Patient was cooperative during the initial assessment to the unit, denied pain, denied SI/SIB/HI, denied any form of hallucinations. Comfort: Diet cola provided per request, medications explanation and education provided, (risperidone and trazodone) given, patient oriented to the new environment, room temperature adjusted to comfort, HOB raised to comfort, will continue to follow POC.

## 2015-07-05 NOTE — Progress Notes (Signed)
Patient discharged home via bus. Instructions reviewed. Patient verbalized understanding of meds and follow up care. Belongings returned. Denies SI/HI/AVH. 
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
## 2015-07-06 ENCOUNTER — Encounter (HOSPITAL_COMMUNITY): Payer: Self-pay | Admitting: *Deleted

## 2015-07-06 ENCOUNTER — Emergency Department (HOSPITAL_COMMUNITY)
Admission: EM | Admit: 2015-07-06 | Discharge: 2015-07-06 | Discharge status: Against Medical Advice | Payer: Self-pay | Source: Ambulatory Visit | Attending: Emergency Medicine | Admitting: Emergency Medicine

## 2015-07-06 DIAGNOSIS — E119 Type 2 diabetes mellitus without complications: Secondary | ICD-10-CM | POA: Insufficient documentation

## 2015-07-06 DIAGNOSIS — R1012 Left upper quadrant pain: Secondary | ICD-10-CM | POA: Insufficient documentation

## 2015-07-06 DIAGNOSIS — I10 Essential (primary) hypertension: Secondary | ICD-10-CM | POA: Insufficient documentation

## 2015-07-06 DIAGNOSIS — R350 Frequency of micturition: Secondary | ICD-10-CM | POA: Insufficient documentation

## 2015-07-06 NOTE — ED Notes (Signed)
This Probation officer told by nurse tech that patient stated she was leaving.

## 2015-07-06 NOTE — ED Notes (Signed)
Pt requested to be taken out of system,  Declined to stay to see MD

## 2015-07-06 NOTE — ED Notes (Signed)
Pt has been in lobby talking with herself and looking at ceiling talking to someone

## 2015-07-06 NOTE — ED Notes (Signed)
Pt released from [facility] yesterday,  Didn't finish antibiotic for UTI and upper resp infections,  Non compliant with diabetic medication, "states she has fever on inside that nobody can find"
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
## 2015-07-16 ENCOUNTER — Encounter (HOSPITAL_COMMUNITY): Payer: Self-pay | Admitting: Emergency Medicine

## 2015-07-16 ENCOUNTER — Emergency Department (HOSPITAL_COMMUNITY)
Admission: EM | Admit: 2015-07-16 | Discharge: 2015-07-16 | Disposition: A | Discharge status: Home / Self Care | Payer: No Typology Code available for payment source | Attending: Emergency Medicine | Admitting: Emergency Medicine

## 2015-07-16 DIAGNOSIS — R4689 Other symptoms and signs involving appearance and behavior: Secondary | ICD-10-CM

## 2015-07-16 DIAGNOSIS — R45850 Homicidal ideations: Secondary | ICD-10-CM

## 2015-07-16 DIAGNOSIS — F419 Anxiety disorder, unspecified: Secondary | ICD-10-CM | POA: Insufficient documentation

## 2015-07-16 DIAGNOSIS — R4589 Other symptoms and signs involving emotional state: Secondary | ICD-10-CM

## 2015-07-16 DIAGNOSIS — F4325 Adjustment disorder with mixed disturbance of emotions and conduct: Secondary | ICD-10-CM | POA: Diagnosis not present

## 2015-07-16 DIAGNOSIS — F332 Major depressive disorder, recurrent severe without psychotic features: Secondary | ICD-10-CM | POA: Insufficient documentation

## 2015-07-16 DIAGNOSIS — E119 Type 2 diabetes mellitus without complications: Secondary | ICD-10-CM | POA: Insufficient documentation

## 2015-07-16 DIAGNOSIS — Z79899 Other long term (current) drug therapy: Secondary | ICD-10-CM | POA: Insufficient documentation

## 2015-07-16 LAB — URINALYSIS, ROUTINE W REFLEX MICROSCOPIC
BILIRUBIN URINE: NEGATIVE
Glucose, UA: NEGATIVE mg/dL
HGB URINE DIPSTICK: NEGATIVE
Ketones, ur: NEGATIVE mg/dL
Leukocytes, UA: NEGATIVE
NITRITE: NEGATIVE
PROTEIN: NEGATIVE mg/dL
SPECIFIC GRAVITY, URINE: 1.011 (ref 1.005–1.030)
UROBILINOGEN UA: 0.2 mg/dL (ref 0.0–1.0)
pH: 5.5 (ref 5.0–8.0)

## 2015-07-16 LAB — COMPREHENSIVE METABOLIC PANEL
ALT: 16 U/L (ref 14–54)
ANION GAP: 7 (ref 5–15)
AST: 20 U/L (ref 15–41)
Albumin: 4 g/dL (ref 3.5–5.0)
Alkaline Phosphatase: 100 U/L (ref 38–126)
BILIRUBIN TOTAL: 0.4 mg/dL (ref 0.3–1.2)
BUN: 22 mg/dL — ABNORMAL HIGH (ref 6–20)
CHLORIDE: 108 mmol/L (ref 101–111)
CO2: 24 mmol/L (ref 22–32)
Calcium: 9.3 mg/dL (ref 8.9–10.3)
Creatinine, Ser: 0.78 mg/dL (ref 0.44–1.00)
Glucose, Bld: 104 mg/dL — ABNORMAL HIGH (ref 65–99)
POTASSIUM: 4.1 mmol/L (ref 3.5–5.1)
Sodium: 139 mmol/L (ref 135–145)
TOTAL PROTEIN: 6.8 g/dL (ref 6.5–8.1)

## 2015-07-16 LAB — CBC WITH DIFFERENTIAL/PLATELET
BASOS ABS: 0 10*3/uL (ref 0.0–0.1)
BASOS PCT: 0 %
EOS ABS: 0.3 10*3/uL (ref 0.0–0.7)
Eosinophils Relative: 5 %
HEMATOCRIT: 35.9 % — AB (ref 36.0–46.0)
HEMOGLOBIN: 11.5 g/dL — AB (ref 12.0–15.0)
Lymphocytes Relative: 43 %
Lymphs Abs: 2.4 10*3/uL (ref 0.7–4.0)
MCH: 26.9 pg (ref 26.0–34.0)
MCHC: 32 g/dL (ref 30.0–36.0)
MCV: 84.1 fL (ref 78.0–100.0)
Monocytes Absolute: 0.2 10*3/uL (ref 0.1–1.0)
Monocytes Relative: 4 %
NEUTROS ABS: 2.7 10*3/uL (ref 1.7–7.7)
NEUTROS PCT: 48 %
Platelets: 238 10*3/uL (ref 150–400)
RBC: 4.27 MIL/uL (ref 3.87–5.11)
RDW: 15.1 % (ref 11.5–15.5)
WBC: 5.6 10*3/uL (ref 4.0–10.5)

## 2015-07-16 LAB — ACETAMINOPHEN LEVEL: Acetaminophen (Tylenol), Serum: <10 ug/mL — ABNORMAL LOW (ref 10–30)

## 2015-07-16 LAB — RAPID URINE DRUG SCREEN, HOSP PERFORMED
AMPHETAMINES: NOT DETECTED
Barbiturates: NOT DETECTED
Benzodiazepines: NOT DETECTED
COCAINE: NOT DETECTED
OPIATES: NOT DETECTED
TETRAHYDROCANNABINOL: NOT DETECTED

## 2015-07-16 LAB — ETHANOL

## 2015-07-16 LAB — SALICYLATE LEVEL

## 2015-07-16 NOTE — ED Notes (Signed)
Bed: ZO10 Expected date:  Expected time:  Means of arrival:  Comments: EMS 49 yo female/SI/difficulty breathing

## 2015-07-16 NOTE — ED Notes (Signed)
Security to wand patient

## 2015-07-16 NOTE — ED Notes (Signed)
[facility], called EMS reporting that she is depressed, does not feel safe there, she has thoughts of suicide and hurting someone. Psychiatric hx per chart review and medics report.

## 2015-07-16 NOTE — ED Notes (Signed)
Pt. To [facility] from ED ambulatory without difficulty, to room 36 . Report from [employer]. Pt. Is alert and oriented, warm and dry in no distress. Pt. Denies SI, HI, and AVH. Pt. Calm and cooperative. Pt. Made aware of security cameras and Q15 minute rounds. Pt. Encouraged to let Nursing staff know of any concerns or needs.

## 2015-07-16 NOTE — ED Notes (Signed)
Pt. Noted sleeping in room. No complaints or concerns voiced. No distress or abnormal behavior noted. Will continue to monitor with security cameras. Q 15 minute rounds continue. 
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
## 2015-07-16 NOTE — Consult Note (Signed)
[facility] Face-to-Face Psychiatry Consult   Reason for Consult:  Upset about living in a shelter Referring Physician:  EDP Patient Identification: [name] MRN:  [id number] Principal Diagnosis: Adjustment disorder with mixed disturbance of emotions and conduct Diagnosis:   Patient Active Problem List   Diagnosis Date Noted  . Adjustment disorder with mixed disturbance of emotions and conduct [F43.25] 04/15/2015    Priority: High  . Severe recurrent major depression without psychotic features [F33.2] 03/12/2015    Priority: High  . Suicidal ideations [R45.851] 03/12/2015    Priority: High  . Tobacco use disorder [Z72.0] 07/05/2015  . Malnutrition of moderate degree [E44.0] 05/31/2015  . Hypokalemia [E87.6] 05/30/2015  . Obstipation [K59.00] 05/30/2015  . Prolonged QT interval [I45.81] 05/30/2015  . Hypertension [I10] 05/30/2015  . Diabetes [E11.9] 09/06/2012  . Delusional disorder [F22] 04/16/2012    Total Time spent with patient: 45 minutes  Subjective:   [name] is a 49 y.o. female patient is stable for discharge.  HPI:  49 yo female presented to the ED initially with suicidal and homicidal ideations.  Today, she denies suicidal/homicidal ideations.  This patient is well known to the providers.  She has a sense of entitlement because "I have two degrees" and she does not feel she should be living in a shelter.  [name] has had multiple admissions and calls to 911 to avoid the shelter.  Discharged from [facility] psychiatric unit on 9/8.  Patient states she is ready to leave.  Stable for discharge. HPI Elements:   Location:  generalized. Quality:  acute. Severity:  mild. Timing:  intermittent. Duration:  brief. Context:  not liking the homeless shelter.  Past Medical History:  Past Medical History  Diagnosis Date  . Anxiety   . Major depressive disorder, recurrent, severe with psychotic features   . Diabetes mellitus without complication    History reviewed. No  pertinent past surgical history. Family History:  Family History  Problem Relation Age of Onset  . Hypertension Other   . Diabetes Other   . Hypertension Mother   . Diabetes Mother   . Aneurysm Mother   . Hypertension Father   . Diabetes Father   . Hypertension Sister   . Diabetes Sister    Social History:  History  Alcohol Use  . Yes     History  Drug Use No    Social History   Social History  . Marital Status: Single    Spouse Name: N/A  . Number of Children: N/A  . Years of Education: N/A   Social History Main Topics  . Smoking status: Never Smoker   . Smokeless tobacco: Never Used  . Alcohol Use: Yes  . Drug Use: No  . Sexual Activity: Not Asked   Other Topics Concern  . None   Social History Narrative   Additional Social History:    Pain Medications: Denies abuse Prescriptions: Denies abuse Over the Counter: Denies abuse History of alcohol / drug use?: No history of alcohol / drug abuse Longest period of sobriety (when/how long): NA                     Allergies:  No Known Allergies  Labs:  Results for orders placed or performed during the hospital encounter of 07/16/15 (from the past 48 hour(s))  Urinalysis, Routine w reflex microscopic     Status: None   Collection Time: 07/16/15 12:40 AM  Result Value Ref Range   Color, Urine YELLOW YELLOW  APPearance CLEAR CLEAR   Specific Gravity, Urine 1.011 1.005 - 1.030   pH 5.5 5.0 - 8.0   Glucose, UA NEGATIVE NEGATIVE mg/dL   Hgb urine dipstick NEGATIVE NEGATIVE   Bilirubin Urine NEGATIVE NEGATIVE   Ketones, ur NEGATIVE NEGATIVE mg/dL   Protein, ur NEGATIVE NEGATIVE mg/dL   Urobilinogen, UA 0.2 0.0 - 1.0 mg/dL   Nitrite NEGATIVE NEGATIVE   Leukocytes, UA NEGATIVE NEGATIVE    Comment: MICROSCOPIC NOT DONE ON URINES WITH NEGATIVE PROTEIN, BLOOD, LEUKOCYTES, NITRITE, OR GLUCOSE <1000 mg/dL.  Urine rapid drug screen (hosp performed)     Status: None   Collection Time: 07/16/15 12:40 AM   Result Value Ref Range   Opiates NONE DETECTED NONE DETECTED   Cocaine NONE DETECTED NONE DETECTED   Benzodiazepines NONE DETECTED NONE DETECTED   Amphetamines NONE DETECTED NONE DETECTED   Tetrahydrocannabinol NONE DETECTED NONE DETECTED   Barbiturates NONE DETECTED NONE DETECTED    Comment:        DRUG SCREEN FOR MEDICAL PURPOSES ONLY.  IF CONFIRMATION IS NEEDED FOR ANY PURPOSE, NOTIFY LAB WITHIN 5 DAYS.        LOWEST DETECTABLE LIMITS FOR URINE DRUG SCREEN Drug Class       Cutoff (ng/mL) Amphetamine      1000 Barbiturate      200 Benzodiazepine   557 Tricyclics       322 Opiates          300 Cocaine          300 THC              50   Acetaminophen level     Status: Abnormal   Collection Time: 07/16/15 12:43 AM  Result Value Ref Range   Acetaminophen (Tylenol), Serum <10 (L) 10 - 30 ug/mL    Comment:        THERAPEUTIC CONCENTRATIONS VARY SIGNIFICANTLY. A RANGE OF 10-30 ug/mL MAY BE AN EFFECTIVE CONCENTRATION FOR MANY PATIENTS. HOWEVER, SOME ARE BEST TREATED AT CONCENTRATIONS OUTSIDE THIS RANGE. ACETAMINOPHEN CONCENTRATIONS >150 ug/mL AT 4 HOURS AFTER INGESTION AND >50 ug/mL AT 12 HOURS AFTER INGESTION ARE OFTEN ASSOCIATED WITH TOXIC REACTIONS.   Comprehensive metabolic panel     Status: Abnormal   Collection Time: 07/16/15 12:43 AM  Result Value Ref Range   Sodium 139 135 - 145 mmol/L   Potassium 4.1 3.5 - 5.1 mmol/L   Chloride 108 101 - 111 mmol/L   CO2 24 22 - 32 mmol/L   Glucose, Bld 104 (H) 65 - 99 mg/dL   BUN 22 (H) 6 - 20 mg/dL   Creatinine, Ser 0.78 0.44 - 1.00 mg/dL   Calcium 9.3 8.9 - 10.3 mg/dL   Total Protein 6.8 6.5 - 8.1 g/dL   Albumin 4.0 3.5 - 5.0 g/dL   AST 20 15 - 41 U/L   ALT 16 14 - 54 U/L   Alkaline Phosphatase 100 38 - 126 U/L   Total Bilirubin 0.4 0.3 - 1.2 mg/dL   GFR calc non Af Amer >60 >60 mL/min   GFR calc Af Amer >60 >60 mL/min    Comment: (NOTE) The eGFR has been calculated using the CKD EPI equation. This calculation  has not been validated in all clinical situations. eGFR's persistently <60 mL/min signify possible Chronic Kidney Disease.    Anion gap 7 5 - 15  Ethanol     Status: None   Collection Time: 07/16/15 12:43 AM  Result Value Ref Range   Alcohol, Ethyl (  B) <5 <5 mg/dL    Comment:        LOWEST DETECTABLE LIMIT FOR SERUM ALCOHOL IS 5 mg/dL FOR MEDICAL PURPOSES ONLY   Salicylate level     Status: None   Collection Time: 07/16/15 12:43 AM  Result Value Ref Range   Salicylate Lvl <3.3 2.8 - 30.0 mg/dL  CBC with Differential     Status: Abnormal   Collection Time: 07/16/15 12:43 AM  Result Value Ref Range   WBC 5.6 4.0 - 10.5 K/uL   RBC 4.27 3.87 - 5.11 MIL/uL   Hemoglobin 11.5 (L) 12.0 - 15.0 g/dL   HCT 35.9 (L) 36.0 - 46.0 %   MCV 84.1 78.0 - 100.0 fL   MCH 26.9 26.0 - 34.0 pg   MCHC 32.0 30.0 - 36.0 g/dL   RDW 15.1 11.5 - 15.5 %   Platelets 238 150 - 400 K/uL   Neutrophils Relative % 48 %   Neutro Abs 2.7 1.7 - 7.7 K/uL   Lymphocytes Relative 43 %   Lymphs Abs 2.4 0.7 - 4.0 K/uL   Monocytes Relative 4 %   Monocytes Absolute 0.2 0.1 - 1.0 K/uL   Eosinophils Relative 5 %   Eosinophils Absolute 0.3 0.0 - 0.7 K/uL   Basophils Relative 0 %   Basophils Absolute 0.0 0.0 - 0.1 K/uL    Vitals: Blood pressure 121/75, pulse 66, temperature 98.6 F (37 C), temperature source Oral, resp. rate 20, height '5\' 5"'  (1.651 m), weight 110.224 kg (243 lb), last menstrual period 12/25/2009, SpO2 100 %.  Risk to Self: Suicidal Ideation: Yes-Currently Present Suicidal Intent: No Is patient at risk for suicide?: Yes Suicidal Plan?: Yes-Currently Present Specify Current Suicidal Plan: Thoughts of cutting wrist Access to Means: Yes Specify Access to Suicidal Means: Acces to knife What has been your use of drugs/alcohol within the last 12 months?: Pt denies How many times?: 0 Other Self Harm Risks: None  Triggers for Past Attempts: None known Intentional Self Injurious Behavior: None Risk to  Others: Homicidal Ideation: Yes-Currently Present Thoughts of Harm to Others: Yes-Currently Present Comment - Thoughts of Harm to Others: Pt reports she had thoughts today of assaulting someone at shelter Current Homicidal Intent: No Current Homicidal Plan: No Access to Homicidal Means: No Identified Victim: None currently History of harm to others?: No Assessment of Violence: None Noted Violent Behavior Description: Pt denies history of violence Does patient have access to weapons?: No Criminal Charges Pending?: No Does patient have a court date: No Prior Inpatient Therapy: Prior Inpatient Therapy: Yes Prior Therapy Dates: 07/04/15-07/05/15; Multiple admits Prior Therapy Facilty/Provider(s): [facility], [facility], other facilities Reason for Treatment: Depression Prior Outpatient Therapy: Prior Outpatient Therapy: Yes Prior Therapy Dates: Current Prior Therapy Facilty/Provider(s): [facility] Reason for Treatment: Depression Does patient have an ACCT team?: No Does patient have Intensive In-House Services?  : No Does patient have [facility] services? : No Does patient have [facility] services?: No  No current facility-administered medications for this encounter.   Current Outpatient Prescriptions  Medication Sig Dispense Refill  . docusate sodium (COLACE) 100 MG capsule Take 1 capsule (100 mg total) by mouth 2 (two) times daily. 15 capsule 0  . polyethylene glycol (MIRALAX) packet Take 17 g by mouth daily. 10 each 0  . risperiDONE (RISPERDAL) 1 MG tablet Take 1 tablet (1 mg total) by mouth at bedtime. 30 tablet 0  . traZODone (DESYREL) 100 MG tablet Take 1 tablet (100 mg total) by mouth  at bedtime. 30 tablet 0    Musculoskeletal: Strength & Muscle Tone: within normal limits Gait & Station: normal Patient leans: N/A  Psychiatric Specialty Exam: Physical Exam  Review of Systems  Constitutional: Negative.   HENT: Negative.   Eyes: Negative.   Respiratory: Negative.    Cardiovascular: Negative.   Gastrointestinal: Negative.   Genitourinary: Negative.   Musculoskeletal: Negative.   Skin: Negative.   Neurological: Negative.   Endo/Heme/Allergies: Negative.   Psychiatric/Behavioral:       Negative    Blood pressure 121/75, pulse 66, temperature 98.6 F (37 C), temperature source Oral, resp. rate 20, height '5\' 5"'  (1.651 m), weight 110.224 kg (243 lb), last menstrual period 12/25/2009, SpO2 100 %.Body mass index is 40.44 kg/(m^2).  General Appearance: Casual  Eye Contact::  Good  Speech:  Normal Rate  Volume:  Normal  Mood:  Euthymic  Affect:  Congruent  Thought Process:  Coherent  Orientation:  Full (Time, Place, and Person)  Thought Content:  WDL  Suicidal Thoughts:  No  Homicidal Thoughts:  No  Memory:  Immediate;   Good Recent;   Good Remote;   Good  Judgement:  Fair  Insight:  Fair  Psychomotor Activity:  Normal  Concentration:  Good  Recall:  Good  Fund of Knowledge:Good  Language: Good  Akathisia:  No  Handed:  Right  AIMS (if indicated):     Assets:  Leisure Time Physical Health Resilience  ADL's:  Intact  Cognition: WNL  Sleep:      Medical Decision Making: Review of Psycho-Social Stressors (1), Review or order clinical lab tests (1) and Review of Medication Regimen & Side Effects (2)  Treatment Plan Summary: Daily contact with patient to assess and evaluate symptoms and progress in treatment, Medication management and Plan :  Adjustment disorder with mixed emotions and conduct  Plan:  No evidence of imminent risk to self or others at present.   Disposition: Discharge to the shelter with resources to [facility], [place] 07/16/2015 10:45 AM Patient seen face-to-face for psychiatric evaluation, chart reviewed and case discussed with the physician extender and developed treatment plan. Reviewed the information documented and agree with the treatment plan. Corena Pilgrim, MD

## 2015-07-16 NOTE — ED Provider Notes (Signed)
CSN: [id number]     Arrival date & time 07/16/15  0006 History   First MD Initiated Contact with Patient 07/16/15 0018     Chief Complaint  Patient presents with  . Depression  . SI & HI      (Consider location/radiation/quality/duration/timing/severity/associated sxs/prior Treatment) HPI Comments: Patient is a 49 year old female who complains about depression, SI, HI that started today. Patient reports people are bothering her. She reports SI without specific plan. She reports HI without specific plan. No drugs or alcohol.   Patient is a 49 y.o. female presenting with depression.  Depression    Past Medical History  Diagnosis Date  . Anxiety   . Major depressive disorder, recurrent, severe with psychotic features   . Diabetes mellitus without complication    History reviewed. No pertinent past surgical history. Family History  Problem Relation Age of Onset  . Hypertension Other   . Diabetes Other   . Hypertension Mother   . Diabetes Mother   . Aneurysm Mother   . Hypertension Father   . Diabetes Father   . Hypertension Sister   . Diabetes Sister    Social History  Substance Use Topics  . Smoking status: Never Smoker   . Smokeless tobacco: Never Used  . Alcohol Use: Yes   OB History    No data available     Review of Systems  Psychiatric/Behavioral: Positive for depression, suicidal ideas and dysphoric mood.  All other systems reviewed and are negative.     Allergies  Review of patient's allergies indicates no known allergies.  Home Medications   Prior to Admission medications   Medication Sig Start Date End Date Taking? Authorizing Provider  docusate sodium (COLACE) 100 MG capsule Take 1 capsule (100 mg total) by mouth 2 (two) times daily. 05/31/15  Yes Nishant Dhungel, MD  polyethylene glycol (MIRALAX) packet Take 17 g by mouth daily. 05/31/15  Yes Nishant Dhungel, MD  risperiDONE (RISPERDAL) 1 MG tablet Take 1 tablet (1 mg total) by mouth at bedtime.  07/05/15  Yes Jolanta B Pucilowska, MD  traZODone (DESYREL) 100 MG tablet Take 1 tablet (100 mg total) by mouth at bedtime. 07/05/15  Yes Jolanta B Pucilowska, MD   BP 121/75 mmHg  Pulse 66  Temp(Src) 98.6 F (37 C) (Oral)  Resp 20  Ht 5\' 5"  (1.651 m)  Wt 243 lb (110.224 kg)  BMI 40.44 kg/m2  SpO2 100%  LMP 12/25/2009 Physical Exam  Constitutional: She is oriented to person, place, and time. She appears well-developed and well-nourished. No distress.  HENT:  Head: Normocephalic and atraumatic.  Eyes: Conjunctivae are normal.  Neck: Normal range of motion.  Cardiovascular: Normal rate and regular rhythm.  Exam reveals no gallop and no friction rub.   No murmur heard. Pulmonary/Chest: Effort normal and breath sounds normal. She has no wheezes. She has no rales. She exhibits no tenderness.  Abdominal: Soft. She exhibits no distension. There is no tenderness. There is no rebound.  Musculoskeletal: Normal range of motion.  Neurological: She is alert and oriented to person, place, and time. Coordination normal.  Speech is goal-oriented. Moves limbs without ataxia.   Skin: Skin is warm and dry.  Psychiatric: She has a normal mood and affect. Her behavior is normal.  Nursing note and vitals reviewed.   ED Course  Procedures (including critical care time) Labs Review Labs Reviewed  ACETAMINOPHEN LEVEL - Abnormal; Notable for the following:    Acetaminophen (Tylenol), Serum <10 (*)  All other components within normal limits  COMPREHENSIVE METABOLIC PANEL - Abnormal; Notable for the following:    Glucose, Bld 104 (*)    BUN 22 (*)    All other components within normal limits  CBC WITH DIFFERENTIAL/PLATELET - Abnormal; Notable for the following:    Hemoglobin 11.5 (*)    HCT 35.9 (*)    All other components within normal limits  ETHANOL  SALICYLATE LEVEL  URINALYSIS, ROUTINE W REFLEX MICROSCOPIC (NOT AT [facility])  URINE RAPID DRUG SCREEN, HOSP PERFORMED    Imaging Review No results  found. I have personally reviewed and evaluated these images and lab results as part of my medical decision-making.   EKG Interpretation None      MDM   Final diagnoses:  Depression  Suicidal behavior  Homicidal behavior    2:16 AM Patient medically cleared and will be evaluated by [facility] counselor.     Alvina Chou, PA-C 07/16/15 [place], MD 07/16/15 [phone]

## 2015-07-16 NOTE — BH Assessment (Signed)
[facility] Assessment Progress Note  Per Corena Pilgrim, MD, this pt does not require psychiatric hospitalization at this time.  She is to be discharged from [facility] with outpatient referrals.  Discharge instructions include referral information for [facility].  Pt's nurse, Nena Jordan, has been notified.  [name], [place] Triage Specialist [phone]

## 2015-07-16 NOTE — ED Notes (Signed)
Pt transfer to [facility] with personal beloning.

## 2015-07-16 NOTE — BH Assessment (Signed)
Received notification of TTS consult request. Spoke to [facility], PA-C who said Pt reports suicidal ideation and homicidal ideation. Tele-assessment will be initiated.  Orpah Greek Anson Fret, [place], [facility], [facility] Triage Specialist [phone]

## 2015-07-16 NOTE — Discharge Instructions (Signed)
For your ongoing mental health needs, you are advised to follow up with [facility].  If you do not currently have an appointment scheduled, new and returning patients are seen at their walk-in clinic.  Walk-in hours are Monday - Friday from 8:00 am - 3:00 pm.  Walk-in patients are seen on a first come, first served basis.  Try to arrive as early as possible for he best chance of being seen the same day:       [facility]      [address]      [place], [place] [address]      [phone]

## 2015-07-16 NOTE — ED Notes (Signed)
Pt arrived to [facility], pt belonging locked in locker #28, 1 black and [place] shirt, 1 black pant, 1 bra, 1 pair sandal, 1 beige tote bag with one dollar bill, one master card, 1 pearl  Necklace, 1 toothbrush, 1 chapstick,  Some mails and  Receipts

## 2015-07-16 NOTE — [facility]HH Suicide Risk Assessment (Signed)
Suicide Risk Assessment  Discharge Assessment   [facility] Discharge Suicide Risk Assessment   Demographic Factors:  Low socioeconomic status  Total Time spent with patient: 45 minutes  Musculoskeletal: Strength & Muscle Tone: within normal limits Gait & Station: normal Patient leans: N/A  Psychiatric Specialty Exam: Physical Exam  Review of Systems  Constitutional: Negative.   HENT: Negative.   Eyes: Negative.   Respiratory: Negative.   Cardiovascular: Negative.   Gastrointestinal: Negative.   Genitourinary: Negative.   Musculoskeletal: Negative.   Skin: Negative.   Neurological: Negative.   Endo/Heme/Allergies: Negative.   Psychiatric/Behavioral:       Negative    Blood pressure 121/75, pulse 66, temperature 98.6 F (37 C), temperature source Oral, resp. rate 20, height 5\' 5"  (1.651 m), weight 110.224 kg (243 lb), last menstrual period 12/25/2009, SpO2 100 %.Body mass index is 40.44 kg/(m^2).  General Appearance: Casual  Eye Contact::  Good  Speech:  Normal Rate  Volume:  Normal  Mood:  Euthymic  Affect:  Congruent  Thought Process:  Coherent  Orientation:  Full (Time, Place, and Person)  Thought Content:  WDL  Suicidal Thoughts:  No  Homicidal Thoughts:  No  Memory:  Immediate;   Good Recent;   Good Remote;   Good  Judgement:  Fair  Insight:  Fair  Psychomotor Activity:  Normal  Concentration:  Good  Recall:  Good  Fund of Knowledge:Good  Language: Good  Akathisia:  No  Handed:  Right  AIMS (if indicated):     Assets:  Leisure Time Physical Health Resilience  ADL's:  Intact  Cognition: WNL  Sleep:      Has this patient used any form of tobacco in the last 30 days? (Cigarettes, Smokeless Tobacco, Cigars, and/or Pipes) No  Mental Status Per Nursing Assessment::   On Admission:   Suicidal, homicidal ideations  Current Mental Status by Physician: NA  Loss Factors: NA  Historical Factors: NA  Risk Reduction Factors:   Positive therapeutic  relationship  Continued Clinical Symptoms:  None  Cognitive Features That Contribute To Risk:  None    Suicide Risk:  Minimal: No identifiable suicidal ideation.  Patients presenting with no risk factors but with morbid ruminations; may be classified as minimal risk based on the severity of the depressive symptoms  Principal Problem: Adjustment disorder with mixed disturbance of emotions and conduct Discharge Diagnoses:  Patient Active Problem List   Diagnosis Date Noted  . Adjustment disorder with mixed disturbance of emotions and conduct [F43.25] 04/15/2015    Priority: High  . Severe recurrent major depression without psychotic features [F33.2] 03/12/2015    Priority: High  . Suicidal ideations [R45.851] 03/12/2015    Priority: High  . Tobacco use disorder [Z72.0] 07/05/2015  . Malnutrition of moderate degree [E44.0] 05/31/2015  . Hypokalemia [E87.6] 05/30/2015  . Obstipation [K59.00] 05/30/2015  . Prolonged QT interval [I45.81] 05/30/2015  . Hypertension [I10] 05/30/2015  . Diabetes [E11.9] 09/06/2012  . Delusional disorder [F22] 04/16/2012      Plan Of Care/Follow-up recommendations:  Activity:  as tolerated Diet:  heart healthy diet  Is patient on multiple antipsychotic therapies at discharge:  No   Has Patient had three or more failed trials of antipsychotic monotherapy by history:  No  Recommended Plan for Multiple Antipsychotic Therapies: NA    LORD, JAMISON, PMH-NP 07/16/2015, 10:56 AM

## 2015-07-19 ENCOUNTER — Emergency Department (HOSPITAL_COMMUNITY)
Admission: EM | Admit: 2015-07-19 | Discharge: 2015-07-19 | Disposition: A | Discharge status: Home / Self Care | Payer: Self-pay | Attending: Emergency Medicine | Admitting: Emergency Medicine

## 2015-07-19 ENCOUNTER — Encounter (HOSPITAL_COMMUNITY): Payer: Self-pay | Admitting: Family Medicine

## 2015-07-19 DIAGNOSIS — R1084 Generalized abdominal pain: Secondary | ICD-10-CM | POA: Insufficient documentation

## 2015-07-19 DIAGNOSIS — F419 Anxiety disorder, unspecified: Secondary | ICD-10-CM | POA: Insufficient documentation

## 2015-07-19 DIAGNOSIS — F329 Major depressive disorder, single episode, unspecified: Secondary | ICD-10-CM | POA: Insufficient documentation

## 2015-07-19 DIAGNOSIS — R3915 Urgency of urination: Secondary | ICD-10-CM | POA: Insufficient documentation

## 2015-07-19 DIAGNOSIS — R109 Unspecified abdominal pain: Secondary | ICD-10-CM

## 2015-07-19 DIAGNOSIS — E119 Type 2 diabetes mellitus without complications: Secondary | ICD-10-CM | POA: Insufficient documentation

## 2015-07-19 DIAGNOSIS — Z3202 Encounter for pregnancy test, result negative: Secondary | ICD-10-CM | POA: Insufficient documentation

## 2015-07-19 LAB — COMPREHENSIVE METABOLIC PANEL
ALBUMIN: 4.1 g/dL (ref 3.5–5.0)
ALK PHOS: 95 U/L (ref 38–126)
ALT: 15 U/L (ref 14–54)
AST: 19 U/L (ref 15–41)
Anion gap: 9 (ref 5–15)
BUN: 22 mg/dL — AB (ref 6–20)
CALCIUM: 8.8 mg/dL — AB (ref 8.9–10.3)
CHLORIDE: 109 mmol/L (ref 101–111)
CO2: 23 mmol/L (ref 22–32)
CREATININE: 0.9 mg/dL (ref 0.44–1.00)
GFR calc Af Amer: >60 mL/min (ref >60)
GFR calc non Af Amer: >60 mL/min (ref >60)
GLUCOSE: 102 mg/dL — AB (ref 65–99)
Potassium: 4 mmol/L (ref 3.5–5.1)
SODIUM: 141 mmol/L (ref 135–145)
Total Bilirubin: 0.2 mg/dL — ABNORMAL LOW (ref 0.3–1.2)
Total Protein: 7 g/dL (ref 6.5–8.1)

## 2015-07-19 LAB — CBC WITH DIFFERENTIAL/PLATELET
BASOS ABS: 0 10*3/uL (ref 0.0–0.1)
BASOS PCT: 0 %
EOS ABS: 0.2 10*3/uL (ref 0.0–0.7)
Eosinophils Relative: 3 %
HCT: 35.6 % — ABNORMAL LOW (ref 36.0–46.0)
HEMOGLOBIN: 11.1 g/dL — AB (ref 12.0–15.0)
LYMPHS ABS: 2.4 10*3/uL (ref 0.7–4.0)
Lymphocytes Relative: 39 %
MCH: 26.2 pg (ref 26.0–34.0)
MCHC: 31.2 g/dL (ref 30.0–36.0)
MCV: 84.2 fL (ref 78.0–100.0)
Monocytes Absolute: 0.3 10*3/uL (ref 0.1–1.0)
Monocytes Relative: 5 %
NEUTROS PCT: 53 %
Neutro Abs: 3.2 10*3/uL (ref 1.7–7.7)
PLATELETS: 230 10*3/uL (ref 150–400)
RBC: 4.23 MIL/uL (ref 3.87–5.11)
RDW: 15 % (ref 11.5–15.5)
WBC: 6.1 10*3/uL (ref 4.0–10.5)

## 2015-07-19 LAB — URINALYSIS, ROUTINE W REFLEX MICROSCOPIC
Bilirubin Urine: NEGATIVE
GLUCOSE, UA: NEGATIVE mg/dL
HGB URINE DIPSTICK: NEGATIVE
Ketones, ur: NEGATIVE mg/dL
Nitrite: NEGATIVE
PROTEIN: NEGATIVE mg/dL
SPECIFIC GRAVITY, URINE: 1.027 (ref 1.005–1.030)
Urobilinogen, UA: 0.2 mg/dL (ref 0.0–1.0)
pH: 5.5 (ref 5.0–8.0)

## 2015-07-19 LAB — URINE MICROSCOPIC-ADD ON

## 2015-07-19 LAB — LIPASE, BLOOD: Lipase: 35 U/L (ref 22–51)

## 2015-07-19 LAB — PREGNANCY, URINE: Preg Test, Ur: NEGATIVE

## 2015-07-19 MED ORDER — NAPROXEN 500 MG PO TABS: 500.0000 mg | ORAL_TABLET | Freq: Two times a day (BID) | ORAL | Status: DC

## 2015-07-19 MED ORDER — NAPROXEN 500 MG PO TABS
500.0000 mg | ORAL_TABLET | Freq: Once | ORAL | Status: AC
  Administered 2015-07-19: 500 mg via ORAL
  Filled 2015-07-19: qty 1

## 2015-07-19 MED ORDER — ONDANSETRON 8 MG PO TBDP
8.0000 mg | ORAL_TABLET | Freq: Once | ORAL | Status: AC
  Administered 2015-07-19: 8 mg via ORAL
  Filled 2015-07-19: qty 1

## 2015-07-19 NOTE — ED Notes (Signed)
Patient has been transported via EMS from [facility]. Pt is complaining of left sided abd pain that started about 16:00 yesterday afternoon.

## 2015-07-19 NOTE — Discharge Instructions (Signed)
Your urine does not show signs of infection today. Your labs are normal. Your urge to urinate may be secondary to overactive bladder. Take naproxen as needed for pain control in your left side. Follow-up with a primary care doctor for further evaluation of your symptoms. Return to the emergency department as needed if symptoms worsen.  Overactive Bladder The bladder has two functions that are totally opposite of the other. One is to relax and stretch out so it can store urine (fills like a balloon), and the other is to contract and squeeze down so that it can empty the urine that it has stored. Proper functioning of the bladder is a complex mixing of these two functions. The filling and emptying of the bladder can be influenced by:  The bladder.  The spinal cord.  The brain.  The nerves going to the bladder.  Other organs that are closely related to the bladder such as prostate in males and the vagina in females. As your bladder fills with urine, nerve signals are sent from the bladder to the brain to tell you that you may need to urinate. Normal urination requires that the bladder squeeze down with sufficient strength to empty the bladder, but this also requires that the bladder squeeze down sufficiently long to finish the job. In addition the sphincter muscles, which normally keep you from leaking urine, must also relax so that the urine can pass. Coordination between the bladder muscle squeezing down and the sphincter muscles relaxing is required to make everything happen normally. With an overactive bladder sometimes the muscles of the bladder contract unexpectedly and involuntarily and this causes an urgent need to urinate. The normal response is to try to hold urine in by contracting the sphincter muscles. Sometimes the bladder contracts so strongly that the sphincter muscles cannot stop the urine from passing out and incontinence occurs. This kind of incontinence is called urge  incontinence. Having an overactive bladder can be embarrassing and awkward. It can keep you from living life the way you want to. Many people think it is just something you have to put up with as you grow older or have certain health conditions. In fact, there are treatments that can help make your life easier and more pleasant. CAUSES  Many things can cause an overactive bladder. Possibilities include:  Urinary tract infection or infection of nearby tissues such as the prostate.  Prostate enlargement.  In women, multiple pregnancies or surgery on the uterus or urethra.  Bladder stones, inflammation, or tumors.  Caffeine.  Alcohol.  Medications. For example, diuretics (drugs that help the body get rid of extra fluid) increase urine production. Some other medicines must be taken with lots of fluids.  Muscle or nerve weakness. This might be the result of a spinal cord injury, a stroke, multiple sclerosis, or Parkinson disease.  Diabetes can cause a high urine volume which fills the bladder so quickly that the normal urge to urinate is triggered very strongly. SYMPTOMS   Loss of bladder control. You feel the need to urinate and cannot make your body wait.  Sudden, strong urges to urinate.  Urinating 8 or more times a day.  Waking up to urinate two or more times a night. DIAGNOSIS  To decide if you have overactive bladder, your health care provider will probably:  Ask about symptoms you have noticed.  Ask about your overall health. This will include questions about any medications you are taking.  Do a physical examination. This will help  determine if there are obvious blockages or other problems.  Order some tests. These might include:  A blood test to check for diabetes or other health issues that could be contributing to the problem.  Urine testing. This could measure the flow of urine and the pressure on the bladder.  A test of your neurological system (the brain, spinal  cord, and nerves). This is the system that senses the need to urinate. Some of these tests are called flow tests, bladder pressure tests, and electrical measurements of the sphincter muscle.  A bladder test to check whether it is emptying completely when you urinate.  Cystoscopy. This test uses a thin tube with a tiny camera on it. It offers a look inside your urethra and bladder to see if there are problems.  Imaging tests. You might be given a contrast dye and then asked to urinate. X-rays are taken to see how your bladder is working. TREATMENT  An overactive bladder can be treated in many ways. The treatment will depend on the cause. Whether you have a mild or severe case also makes a difference. Often, treatment can be given in your health care provider's office or clinic. Be sure to discuss the different options with your caregiver. They include:  Behavioral treatments. These do not involve medication or surgery:  Bladder training. For this, you would follow a schedule to urinate at regular intervals. This helps you learn to control the urge to urinate. At first, you might be asked to wait a few minutes after feeling the urge. In time, you should be able to schedule bathroom visits an hour or more apart.  Kegel exercises. These exercises strengthen the pelvic floor muscles, which support the bladder. Toning these muscles can help control urination even if the bladder muscles are overactive. A specialist will teach you how to do these exercises correctly. They will require daily practice.  Weight loss. If you are obese or overweight, losing weight might stop your bladder from being overactive. Talk to your health care provider about how many pounds you should lose. Also ask if there is a specific program or method that would work best for you.  Diet change. This might be suggested if constipation is making your overactive bladder worse. Your health care provider or a nutritionist can explain  ways to change what you eat to ease constipation. Other people might need to take in less caffeine or alcohol. Sometimes drinking fewer fluids is needed, too.  Protection. This is not an actual treatment. But, you could wear special pads to take care of any leakage while you wait for other treatments to take effect. This will help you avoid embarrassment.  Physical treatments.  Electrical stimulation. Electrodes will send gentle pulses to the nerves or muscles that help control the bladder. The goal is to strengthen them. Sometimes this is done with the electrodes outside the body. Or, they might be placed inside the body (implanted). This treatment can take several months to have an effect.  Medications. These are usually used along with other treatments. Several medicines are available. Some are injected into the muscles involved in urination. Others come in pill form. Medications sometimes prescribed include:  Anticholinergics. These drugs block the signals that the nerves deliver to the bladder. This keeps it from releasing urine at the wrong time. Researchers think the drugs might help in other ways, too.  Imipramine. This is an antidepressant. But, it relaxes bladder muscles.  Botox. This is still experimental. Some people  believe that injecting it into the bladder muscles will relax them so they work more normally. It has also been injected into the sphincter muscle when the sphincter muscle does not open properly. This is a temporary fix, however. Also, it might make matters worse, especially in older people.  Surgery.  A device might be implanted to help manage your nerves. It works on the nerves that signal when you need to urinate.  Surgery is sometimes needed with electrical stimulation. If the electrodes are implanted, this is done through surgery.  Sometimes repairs need to be made through surgery. For example, the size of the bladder can be changed. This is usually done in severe  cases only. HOME CARE INSTRUCTIONS   Take any medications your health care provider prescribed or suggested. Follow the directions carefully.  Practice any lifestyle changes that are recommended. These might include:  Drinking less fluid or drinking at different times of the day. If you need to urinate often during the night, for example, you may need to stop drinking fluids early in the evening.  Cutting down on caffeine or alcohol. They can both make an overactive bladder worse. Caffeine is found in coffee, tea, and sodas.  Doing Kegel exercises to strengthen muscles.  Losing weight, if that is recommended.  Eating a healthy and balanced diet. This will help you avoid constipation.  Keep a journal or a log. You might be asked to record how much you drink and when, and also when you feel the need to urinate.  Learn how to care for implants or other devices, such as pessaries. SEEK MEDICAL CARE IF:   Your overactive bladder gets worse.  You feel increased pain or irritation when you urinate.  You notice blood in your urine.  You have questions about any medications or devices that your health care provider recommended.  You notice blood, pus, or swelling at the site of any test or treatment procedure.  You have an oral temperature above 102F (38.9C). SEEK IMMEDIATE MEDICAL CARE IF:  You have an oral temperature above 102F (38.9C), not controlled by medicine. Document Released: 08/09/2009 Document Revised: 02/27/2014 Document Reviewed: 08/09/2009 [facility] Patient Information 2015 [place], [place]. This information is not intended to replace advice given to you by your health care provider. Make sure you discuss any questions you have with your health care provider.

## 2015-07-19 NOTE — ED Provider Notes (Signed)
CSN: [id number]     Arrival date & time 07/19/15  0413 History   First MD Initiated Contact with Patient 07/19/15 0440     Chief Complaint  Patient presents with  . Abdominal Pain     (Consider location/radiation/quality/duration/timing/severity/associated sxs/prior Treatment) HPI Comments: Patient is a 49 y/o female with hx of MDD, DM, and anxiety. Patient presents to the ED for evaluation of L flank pain which she states has been going on for "a while". Patient states she was seen here for this a few weeks ago. She is convinced that "they didn't treat the urine". Patient reporting urgency and hesitancy. No hematuria, fever, nausea or vomiting. Patient denies a hx of abdominal surgeries. She presents to the ED via EMS from [facility].  Patient is a 49 y.o. female presenting with abdominal pain. The history is provided by the patient. No language interpreter was used.  Abdominal Pain Associated symptoms: no fever, no nausea and no vomiting     Past Medical History  Diagnosis Date  . Anxiety   . Major depressive disorder, recurrent, severe with psychotic features   . Diabetes mellitus without complication    History reviewed. No pertinent past surgical history. Family History  Problem Relation Age of Onset  . Hypertension Other   . Diabetes Other   . Hypertension Mother   . Diabetes Mother   . Aneurysm Mother   . Hypertension Father   . Diabetes Father   . Hypertension Sister   . Diabetes Sister    Social History  Substance Use Topics  . Smoking status: Never Smoker   . Smokeless tobacco: Never Used  . Alcohol Use: No   OB History    No data available      Review of Systems  Constitutional: Negative for fever.  Gastrointestinal: Positive for abdominal pain. Negative for nausea and vomiting.  Genitourinary: Positive for urgency, frequency and flank pain (left).  All other systems reviewed and are negative.   Allergies  Review of patient's allergies indicates  no known allergies.  Home Medications   Prior to Admission medications   Medication Sig Start Date End Date Taking? Authorizing Provider  docusate sodium (COLACE) 100 MG capsule Take 1 capsule (100 mg total) by mouth 2 (two) times daily. Patient not taking: Reported on 07/19/2015 05/31/15   Nishant Dhungel, MD  naproxen (NAPROSYN) 500 MG tablet Take 1 tablet (500 mg total) by mouth 2 (two) times daily. 07/19/15   Antonietta Breach, PA-C  polyethylene glycol [facility]) packet Take 17 g by mouth daily. Patient not taking: Reported on 07/19/2015 05/31/15   Nishant Dhungel, MD  risperiDONE (RISPERDAL) 1 MG tablet Take 1 tablet (1 mg total) by mouth at bedtime. Patient not taking: Reported on 07/19/2015 07/05/15   Clovis Fredrickson, MD  traZODone (DESYREL) 100 MG tablet Take 1 tablet (100 mg total) by mouth at bedtime. Patient not taking: Reported on 07/19/2015 07/05/15   Clovis Fredrickson, MD   BP 132/62 mmHg  Pulse 72  Temp(Src) 98.3 F (36.8 C) (Oral)  Resp 20  Ht 5\' 5"  (1.651 m)  Wt 233 lb (105.688 kg)  BMI 38.77 kg/m2  SpO2 100%  LMP 12/25/2009   Physical Exam  Constitutional: She is oriented to person, place, and time. She appears well-developed and well-nourished. No distress.  Nontoxic/nonseptic appearing  HENT:  Head: Normocephalic and atraumatic.  Eyes: Conjunctivae and EOM are normal. No scleral icterus.  Neck: Normal range of motion.  Cardiovascular: Normal rate, regular rhythm  and intact distal pulses.   Pulmonary/Chest: Effort normal. No respiratory distress.  Respirations even and unlabored  Abdominal: Soft. She exhibits no distension. There is no rebound and no guarding.  Soft, obese abdomen without masses or rigidity. There is mild diffuse TTP; no focal tenderness. No peritoneal signs.  Musculoskeletal: Normal range of motion.  Neurological: She is alert and oriented to person, place, and time. She exhibits normal muscle tone. Coordination normal.  Patient ambulatory with  steady gait.  Skin: Skin is warm and dry. No rash noted. She is not diaphoretic. No erythema. No pallor.  Psychiatric: She has a normal mood and affect. Her behavior is normal.  Nursing note and vitals reviewed.   ED Course  Procedures (including critical care time) Labs Review Labs Reviewed  CBC WITH DIFFERENTIAL/PLATELET - Abnormal; Notable for the following:    Hemoglobin 11.1 (*)    HCT 35.6 (*)    All other components within normal limits  COMPREHENSIVE METABOLIC PANEL - Abnormal; Notable for the following:    Glucose, Bld 102 (*)    BUN 22 (*)    Calcium 8.8 (*)    Total Bilirubin 0.2 (*)    All other components within normal limits  URINALYSIS, ROUTINE W REFLEX MICROSCOPIC (NOT AT [facility]) - Abnormal; Notable for the following:    Leukocytes, UA MODERATE (*)    All other components within normal limits  LIPASE, BLOOD  PREGNANCY, URINE  URINE MICROSCOPIC-ADD ON    Imaging Review No results found.   I have personally reviewed and evaluated these images and lab results as part of my medical decision-making.   EKG Interpretation None      MDM   Final diagnoses:  Urinary urgency  Left flank pain    49 year old female presents to the emergency department for evaluation of left side/flank pain which she states is associated with urinary urgency and frequency. She denies any burning dysuria or fever. Patient states that her symptoms have been going on for "a while". She states that she was seen for similar complaints in in ED setting a few weeks ago. Patient was recently seen and evaluated for depression. Patient with no complaints of abdominal pain at this time and abdominal exam in nonsurgical. Laboratory workup is noncontributory and consistent with prior workups. Urinalysis does not suggest urinary tract infection. Urine pregnancy is negative.  We further emergent workup is indicated at this time. Have indicated that urgency may be secondary to overactive bladder. Will  prescribe naproxen for pain control. Patient also referred to primary care for further evaluation of her symptoms. Return precautions given at discharge. Patient discharged in satisfactory condition.   Filed Vitals:   07/19/15 0419 07/19/15 0423  BP:  132/62  Pulse:  72  Temp:  98.3 F (36.8 C)  TempSrc:  Oral  Resp:  20  Height: 5\' 5"  (1.651 m)   Weight: 233 lb (105.688 kg)   SpO2:  100%       Antonietta Breach, PA-C 90/30/09 2330  Delora Fuel, MD 07/62/26 3335

## 2015-07-19 NOTE — ED Notes (Signed)
Bed: UV22 Expected date:  Expected time:  Means of arrival:  Comments: Left sided pain

## 2015-07-23 ENCOUNTER — Encounter (HOSPITAL_COMMUNITY): Payer: Self-pay | Admitting: Emergency Medicine

## 2015-07-23 ENCOUNTER — Emergency Department (HOSPITAL_COMMUNITY)
Admission: EM | Admit: 2015-07-23 | Discharge: 2015-07-23 | Disposition: A | Discharge status: Home / Self Care | Payer: Self-pay

## 2015-07-23 ENCOUNTER — Emergency Department (HOSPITAL_COMMUNITY)
Admission: EM | Admit: 2015-07-23 | Discharge: 2015-07-23 | Discharge status: Against Medical Advice | Payer: Self-pay | Attending: Emergency Medicine | Admitting: Emergency Medicine

## 2015-07-23 DIAGNOSIS — I10 Essential (primary) hypertension: Secondary | ICD-10-CM | POA: Insufficient documentation

## 2015-07-23 DIAGNOSIS — Z008 Encounter for other general examination: Secondary | ICD-10-CM | POA: Insufficient documentation

## 2015-07-23 DIAGNOSIS — E119 Type 2 diabetes mellitus without complications: Secondary | ICD-10-CM | POA: Insufficient documentation

## 2015-07-23 HISTORY — DX: Essential (primary) hypertension: I10

## 2015-07-23 LAB — CBG MONITORING, ED: GLUCOSE-CAPILLARY: 125 mg/dL — AB (ref 65–99)
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
## 2015-07-23 NOTE — ED Notes (Signed)
Pt decided she now wants to leave and is not going to wait for MD

## 2015-07-23 NOTE — ED Notes (Signed)
Pt decides not to leave AMA and wants to wait for the MD for evaluation

## 2015-07-23 NOTE — ED Notes (Signed)
Patient denies suicidal or homicidal ideation. Denies thoughts or plan to harm herself. Pt left prior to triage because she does not want to change into scrubs.

## 2015-07-29 ENCOUNTER — Emergency Department (HOSPITAL_COMMUNITY)
Admission: EM | Admit: 2015-07-29 | Discharge: 2015-07-29 | Disposition: A | Discharge status: Home / Self Care | Payer: Self-pay | Attending: Emergency Medicine | Admitting: Emergency Medicine

## 2015-07-29 ENCOUNTER — Encounter (HOSPITAL_COMMUNITY): Payer: Self-pay | Admitting: Oncology

## 2015-07-29 DIAGNOSIS — E119 Type 2 diabetes mellitus without complications: Secondary | ICD-10-CM | POA: Insufficient documentation

## 2015-07-29 DIAGNOSIS — F419 Anxiety disorder, unspecified: Secondary | ICD-10-CM | POA: Insufficient documentation

## 2015-07-29 DIAGNOSIS — Z5900 Homelessness unspecified: Secondary | ICD-10-CM

## 2015-07-29 DIAGNOSIS — I10 Essential (primary) hypertension: Secondary | ICD-10-CM | POA: Insufficient documentation

## 2015-07-29 DIAGNOSIS — F329 Major depressive disorder, single episode, unspecified: Secondary | ICD-10-CM | POA: Insufficient documentation
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
## 2015-07-29 NOTE — ED Notes (Signed)
Per EMS pt states she does not want to be at the homeless shelter.  No medical problems reported.

## 2015-08-02 ENCOUNTER — Encounter (HOSPITAL_COMMUNITY): Payer: Self-pay | Admitting: Emergency Medicine

## 2015-08-02 ENCOUNTER — Emergency Department (HOSPITAL_COMMUNITY)
Admission: EM | Admit: 2015-08-02 | Discharge: 2015-08-02 | Disposition: A | Discharge status: Home / Self Care | Payer: Self-pay | Attending: Emergency Medicine | Admitting: Emergency Medicine

## 2015-08-02 ENCOUNTER — Emergency Department (HOSPITAL_COMMUNITY): Payer: Self-pay

## 2015-08-02 DIAGNOSIS — I10 Essential (primary) hypertension: Secondary | ICD-10-CM | POA: Insufficient documentation

## 2015-08-02 DIAGNOSIS — E119 Type 2 diabetes mellitus without complications: Secondary | ICD-10-CM | POA: Insufficient documentation

## 2015-08-02 DIAGNOSIS — Y9389 Activity, other specified: Secondary | ICD-10-CM | POA: Insufficient documentation

## 2015-08-02 DIAGNOSIS — W102XXA Fall (on)(from) incline, initial encounter: Secondary | ICD-10-CM | POA: Insufficient documentation

## 2015-08-02 DIAGNOSIS — Y998 Other external cause status: Secondary | ICD-10-CM | POA: Insufficient documentation

## 2015-08-02 DIAGNOSIS — F333 Major depressive disorder, recurrent, severe with psychotic symptoms: Secondary | ICD-10-CM | POA: Insufficient documentation

## 2015-08-02 DIAGNOSIS — F419 Anxiety disorder, unspecified: Secondary | ICD-10-CM | POA: Insufficient documentation

## 2015-08-02 DIAGNOSIS — S199XXA Unspecified injury of neck, initial encounter: Secondary | ICD-10-CM | POA: Insufficient documentation

## 2015-08-02 DIAGNOSIS — Y9289 Other specified places as the place of occurrence of the external cause: Secondary | ICD-10-CM | POA: Insufficient documentation

## 2015-08-02 DIAGNOSIS — S3992XA Unspecified injury of lower back, initial encounter: Secondary | ICD-10-CM | POA: Insufficient documentation

## 2015-08-02 LAB — I-STAT CHEM 8, ED
BUN: 19 mg/dL (ref 6–20)
CALCIUM ION: 1.18 mmol/L (ref 1.12–1.23)
CREATININE: 0.9 mg/dL (ref 0.44–1.00)
Chloride: 106 mmol/L (ref 101–111)
Glucose, Bld: 86 mg/dL (ref 65–99)
HCT: 39 % (ref 36.0–46.0)
Hemoglobin: 13.3 g/dL (ref 12.0–15.0)
Potassium: 3.7 mmol/L (ref 3.5–5.1)
Sodium: 142 mmol/L (ref 135–145)
TCO2: 23 mmol/L (ref 0–100)

## 2015-08-02 MED ORDER — NAPROXEN 500 MG PO TABS: ORAL_TABLET | ORAL | Status: DC

## 2015-08-02 NOTE — ED Notes (Signed)
Off floor for testing 

## 2015-08-02 NOTE — ED Notes (Signed)
Bed: [facility] Expected date:  Expected time:  Means of arrival:  Comments: ems 

## 2015-08-02 NOTE — ED Provider Notes (Signed)
CSN: [id number]     Arrival date & time 08/02/15  1407 History   First MD Initiated Contact with Patient 08/02/15 1514     Chief Complaint  Patient presents with  . Fall     (Consider location/radiation/quality/duration/timing/severity/associated sxs/prior Treatment) Patient is a 49 y.o. female presenting with fall. The history is provided by the patient (Patient states that she fell and hit her back. She complains of pain in her lower back and her neck. No loss of consciousness.).  Fall This is a new problem. The current episode started 3 to 5 hours ago. The problem occurs rarely. The problem has been resolved. Pertinent negatives include no chest pain, no abdominal pain and no headaches. Nothing aggravates the symptoms. Nothing relieves the symptoms.    Past Medical History  Diagnosis Date  . Anxiety   . Major depressive disorder, recurrent, severe with psychotic features ([place])   . Diabetes mellitus without complication ([place])   . Hypertension    History reviewed. No pertinent past surgical history. Family History  Problem Relation Age of Onset  . Hypertension Other   . Diabetes Other   . Hypertension Mother   . Diabetes Mother   . Aneurysm Mother   . Hypertension Father   . Diabetes Father   . Hypertension Sister   . Diabetes Sister    Social History  Substance Use Topics  . Smoking status: Never Smoker   . Smokeless tobacco: Never Used  . Alcohol Use: No   OB History    No data available     Review of Systems  Constitutional: Negative for appetite change and fatigue.  HENT: Negative for congestion, ear discharge and sinus pressure.   Eyes: Negative for discharge.  Respiratory: Negative for cough.   Cardiovascular: Negative for chest pain.  Gastrointestinal: Negative for abdominal pain and diarrhea.  Genitourinary: Negative for frequency and hematuria.  Musculoskeletal: Negative for back pain.       Posterior neck and lumbar spine pain  Skin: Negative for  rash.  Neurological: Negative for seizures and headaches.  Psychiatric/Behavioral: Negative for hallucinations.      Allergies  Review of patient's allergies indicates no known allergies.  Home Medications   Prior to Admission medications   Medication Sig Start Date End Date Taking? Authorizing Provider  docusate sodium (COLACE) 100 MG capsule Take 1 capsule (100 mg total) by mouth 2 (two) times daily. Patient not taking: Reported on 07/19/2015 05/31/15   Flonnie Overman Dhungel, MD  naproxen (NAPROSYN) 500 MG tablet Take one twice a day for pain 08/02/15   Milton Ferguson, MD  polyethylene glycol [facility]) packet Take 17 g by mouth daily. Patient not taking: Reported on 07/19/2015 05/31/15   Nishant Dhungel, MD  risperiDONE (RISPERDAL) 1 MG tablet Take 1 tablet (1 mg total) by mouth at bedtime. Patient not taking: Reported on 07/19/2015 07/05/15   Clovis Fredrickson, MD  traZODone (DESYREL) 100 MG tablet Take 1 tablet (100 mg total) by mouth at bedtime. Patient not taking: Reported on 07/19/2015 07/05/15   Clovis Fredrickson, MD   BP 141/90 mmHg  Pulse 62  Temp(Src) 98.3 F (36.8 C) (Oral)  Resp 18  SpO2 98%  LMP 12/25/2009 Physical Exam  Constitutional: She is oriented to person, place, and time. She appears well-developed.  HENT:  Head: Normocephalic.  Eyes: Conjunctivae and EOM are normal. No scleral icterus.  Neck: Neck supple. No thyromegaly present.  Cardiovascular: Normal rate and regular rhythm.  Exam reveals no gallop and no  friction rub.   No murmur heard. Pulmonary/Chest: No stridor. She has no wheezes. She has no rales. She exhibits no tenderness.  Abdominal: She exhibits no distension. There is no tenderness. There is no rebound.  Musculoskeletal: Normal range of motion. She exhibits tenderness. She exhibits no edema.  Tenderness to cervical spine and lumbar spine.  Lymphadenopathy:    She has no cervical adenopathy.  Neurological: She is oriented to person, place, and time.  She exhibits normal muscle tone. Coordination normal.  Skin: No rash noted. No erythema.  Psychiatric: She has a normal mood and affect. Her behavior is normal.    ED Course  Procedures (including critical care time) Labs Review Labs Reviewed  I-STAT CHEM 8, ED    Imaging Review Dg Lumbar Spine Complete  08/02/2015   CLINICAL DATA:  Pain following fall  EXAM: LUMBAR SPINE - COMPLETE 4+ VIEW  COMPARISON:  None.  FINDINGS: Frontal, lateral, spot lumbosacral lateral, and bilateral oblique views were obtained. There are 5 non-rib-bearing lumbar type vertebral bodies. There is mild levoscoliosis. There is no fracture or spondylolisthesis. Disc spaces appear intact. There is no appreciable facet arthropathy. 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  IMPRESSION: No fracture or spondylolisthesis. No appreciable joint space narrowing. 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 Mild scoliosis.   Electronically Signed   By: Lowella Grip III M.D.   On: 08/02/2015 15:56   Ct Head Wo Contrast  08/02/2015   CLINICAL DATA:  [place].  Hit head.  EXAM: CT HEAD WITHOUT CONTRAST  CT CERVICAL SPINE WITHOUT CONTRAST  TECHNIQUE: Multidetector CT imaging of the head and cervical spine was performed following the standard protocol without intravenous contrast. Multiplanar CT image reconstructions of the cervical spine were also generated.  COMPARISON:  06/06/2015  FINDINGS: CT HEAD FINDINGS  The ventricles are normal in size and configuration. No extra-axial fluid collections are identified. The gray-[place] differentiation is normal. No CT findings for acute intracranial process such as hemorrhage or infarction. No mass lesions. The brainstem and cerebellum are grossly normal.  The bony structures are intact. The paranasal sinuses and mastoid air cells are clear. The globes are intact.  CT CERVICAL SPINE FINDINGS  Normal alignment of the cervical vertebral bodies. Disc spaces  and vertebral bodies are maintained. No acute fracture or abnormal prevertebral soft tissue swelling. The facets are normally aligned. The skullbase C1 and C1-2 articulations are maintained. The dens is normal. No large disc protrusions, spinal or foraminal stenosis. The lung apices are clear.  IMPRESSION: No acute intracranial findings or skull fracture.  Normal alignment of the cervical vertebral bodies and no acute fracture.   Electronically Signed   By: Marijo Sanes M.D.   On: 08/02/2015 16:00   Ct Cervical Spine Wo Contrast  08/02/2015   CLINICAL DATA:  [place].  Hit head.  EXAM: CT HEAD WITHOUT CONTRAST  CT CERVICAL SPINE WITHOUT CONTRAST  TECHNIQUE: Multidetector CT imaging of the head and cervical spine was performed following the standard protocol without intravenous contrast. Multiplanar CT image reconstructions of the cervical spine were also generated.  COMPARISON:  06/06/2015  FINDINGS: CT HEAD FINDINGS  The ventricles are normal in size and configuration. No extra-axial fluid collections are identified. The gray-[place] differentiation is normal. No CT findings for acute intracranial process such as hemorrhage or infarction. No mass lesions. The brainstem and cerebellum are grossly normal.  The bony structures are intact. The paranasal sinuses and mastoid air cells are  clear. The globes are intact.  CT CERVICAL SPINE FINDINGS  Normal alignment of the cervical vertebral bodies. Disc spaces and vertebral bodies are maintained. No acute fracture or abnormal prevertebral soft tissue swelling. The facets are normally aligned. The skullbase C1 and C1-2 articulations are maintained. The dens is normal. No large disc protrusions, spinal or foraminal stenosis. The lung apices are clear.  IMPRESSION: No acute intracranial findings or skull fracture.  Normal alignment of the cervical vertebral bodies and no acute fracture.   Electronically Signed   By: Marijo Sanes M.D.   On: 08/02/2015 16:00   I have  personally reviewed and evaluated these images and lab results as part of my medical decision-making.   EKG Interpretation None      MDM   Final diagnoses:  Fall (on)(from) incline, initial encounter    CT head and cervical spine unremarkable lumbar spine series unremarkable patient will be treated with Naprosyn for pain and follow-up with her md    Milton Ferguson, MD 08/02/15 1720

## 2015-08-02 NOTE — ED Notes (Signed)
Patient is acting like she cant talk-unable and/or unwilling to follow simple commands

## 2015-08-02 NOTE — ED Notes (Addendum)
Back from testing-patient took off C-collar-states she cant breath

## 2015-08-02 NOTE — Discharge Instructions (Signed)
Follow up with your md as needed °
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
## 2015-08-12 ENCOUNTER — Emergency Department (HOSPITAL_COMMUNITY)
Admission: EM | Admit: 2015-08-12 | Discharge: 2015-08-12 | Disposition: A | Discharge status: Home / Self Care | Payer: Self-pay | Attending: Emergency Medicine | Admitting: Emergency Medicine

## 2015-08-12 ENCOUNTER — Encounter (HOSPITAL_COMMUNITY): Payer: Self-pay | Admitting: *Deleted

## 2015-08-12 DIAGNOSIS — F22 Delusional disorders: Secondary | ICD-10-CM | POA: Insufficient documentation

## 2015-08-12 DIAGNOSIS — R42 Dizziness and giddiness: Secondary | ICD-10-CM | POA: Insufficient documentation

## 2015-08-12 DIAGNOSIS — F419 Anxiety disorder, unspecified: Secondary | ICD-10-CM | POA: Insufficient documentation

## 2015-08-12 DIAGNOSIS — I10 Essential (primary) hypertension: Secondary | ICD-10-CM | POA: Insufficient documentation

## 2015-08-12 DIAGNOSIS — E119 Type 2 diabetes mellitus without complications: Secondary | ICD-10-CM | POA: Insufficient documentation

## 2015-08-12 LAB — RAPID URINE DRUG SCREEN, HOSP PERFORMED
Amphetamines: NOT DETECTED
Barbiturates: NOT DETECTED
Benzodiazepines: NOT DETECTED
Cocaine: NOT DETECTED
Opiates: NOT DETECTED
Tetrahydrocannabinol: NOT DETECTED

## 2015-08-12 LAB — COMPREHENSIVE METABOLIC PANEL
ALT: 18 U/L (ref 14–54)
AST: 21 U/L (ref 15–41)
Albumin: 4 g/dL (ref 3.5–5.0)
Alkaline Phosphatase: 98 U/L (ref 38–126)
Anion gap: 8 (ref 5–15)
BUN: 22 mg/dL — ABNORMAL HIGH (ref 6–20)
CO2: 22 mmol/L (ref 22–32)
Calcium: 9.2 mg/dL (ref 8.9–10.3)
Chloride: 110 mmol/L (ref 101–111)
Creatinine, Ser: 0.87 mg/dL (ref 0.44–1.00)
GFR calc Af Amer: >60 mL/min (ref >60)
GFR calc non Af Amer: >60 mL/min (ref >60)
Glucose, Bld: 144 mg/dL — ABNORMAL HIGH (ref 65–99)
Potassium: 4 mmol/L (ref 3.5–5.1)
Sodium: 140 mmol/L (ref 135–145)
Total Bilirubin: 0.2 mg/dL — ABNORMAL LOW (ref 0.3–1.2)
Total Protein: 7.3 g/dL (ref 6.5–8.1)

## 2015-08-12 LAB — CBC
HCT: 35.7 % — ABNORMAL LOW (ref 36.0–46.0)
Hemoglobin: 11.3 g/dL — ABNORMAL LOW (ref 12.0–15.0)
MCH: 26.2 pg (ref 26.0–34.0)
MCHC: 31.7 g/dL (ref 30.0–36.0)
MCV: 82.6 fL (ref 78.0–100.0)
Platelets: 328 10*3/uL (ref 150–400)
RBC: 4.32 MIL/uL (ref 3.87–5.11)
RDW: 13.9 % (ref 11.5–15.5)
WBC: 5.5 10*3/uL (ref 4.0–10.5)

## 2015-08-12 LAB — SALICYLATE LEVEL: Salicylate Lvl: <4 mg/dL (ref 2.8–30.0)

## 2015-08-12 LAB — ACETAMINOPHEN LEVEL: Acetaminophen (Tylenol), Serum: <10 ug/mL — ABNORMAL LOW (ref 10–30)

## 2015-08-12 LAB — ETHANOL: Alcohol, Ethyl (B): <5 mg/dL (ref <5)

## 2015-08-12 NOTE — Discharge Instructions (Signed)
Return here as needed.  Follow up with a primary care doctor °

## 2015-08-12 NOTE — ED Notes (Signed)
UNABLE TO COLLECT LABS AT THIS TIME PATIENT TALKING WITH PA.
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
## 2015-08-12 NOTE — ED Notes (Signed)
Bed: YJ85 Expected date: 08/12/15 Expected time: 3:49 PM Means of arrival: Ambulance Comments: Med Clearance
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
## 2015-09-06 DIAGNOSIS — Z5900 Homelessness unspecified: Secondary | ICD-10-CM

## 2015-09-06 NOTE — Congregational Nurse Program (Signed)
Congregational Nurse Program Note  Date of Encounter: 09/06/2015  Past Medical History: Past Medical History  Diagnosis Date  . Anxiety   . Major depressive disorder, recurrent, severe with psychotic features ([place])   . Diabetes mellitus without complication ([place])   . Hypertension     Encounter Details:     CNP Questionnaire - 09/06/15 1722    Patient Demographics   Is this a new or existing patient? New   Patient is considered a/an Not Applicable   Patient Assistance   Patient's financial/insurance status Uninsured;Low Income   Patient referred to apply for the following financial assistance [employer] insecurities addressed Provided food supplies   Transportation assistance No   Assistance securing medications No   Educational health offerings Not Applicable   Encounter Details   Primary purpose of visit Education/Health Concerns   Was an Emergency Department visit averted? Not Applicable   Does patient have a medical provider? No   Patient referred to Clinic   Was a mental health screening completed? (GAINS tool) No   Does patient have dental issues? No   Since previous encounter, have you referred patient for abnormal blood pressure that resulted in a new diagnosis or medication change? No   Since previous encounter, have you referred patient for abnormal blood glucose that resulted in a new diagnosis or medication change? No       Came to clinic c/o left foot pain, heal area, plantar side.  Quarter size blister area noted.  Bandaid applied.  Toenails extremely long.  States has a history of elevated Blood sugars.  During the assessment, client got up left, stating she had to go the bathroom as she was nauseated.  Client returned a few minutes later.  Stated she did not feel like having B/P or Blood sugar checked.  Encouraged her to return to clinic to follow up with medical concerns

## 2015-09-20 ENCOUNTER — Emergency Department (HOSPITAL_COMMUNITY)
Admission: EM | Admit: 2015-09-20 | Discharge: 2015-09-20 | Disposition: A | Discharge status: Home / Self Care | Payer: Self-pay | Attending: Emergency Medicine | Admitting: Emergency Medicine

## 2015-09-20 ENCOUNTER — Encounter (HOSPITAL_COMMUNITY): Payer: Self-pay | Admitting: Nurse Practitioner

## 2015-09-20 DIAGNOSIS — R110 Nausea: Secondary | ICD-10-CM | POA: Insufficient documentation

## 2015-09-20 DIAGNOSIS — R05 Cough: Secondary | ICD-10-CM | POA: Insufficient documentation

## 2015-09-20 DIAGNOSIS — E119 Type 2 diabetes mellitus without complications: Secondary | ICD-10-CM | POA: Insufficient documentation

## 2015-09-20 DIAGNOSIS — R0981 Nasal congestion: Secondary | ICD-10-CM | POA: Insufficient documentation

## 2015-09-20 DIAGNOSIS — F419 Anxiety disorder, unspecified: Secondary | ICD-10-CM | POA: Insufficient documentation

## 2015-09-20 DIAGNOSIS — I10 Essential (primary) hypertension: Secondary | ICD-10-CM | POA: Insufficient documentation

## 2015-09-20 NOTE — Discharge Instructions (Signed)
Panic Attacks Panic attacks are sudden, short feelings of great fear or discomfort. You may have them for no reason when you are relaxed, when you are uneasy (anxious), or when you are sleeping.  HOME CARE  Take all your medicines as told.  Check with your doctor before starting new medicines.  Keep all doctor visits. GET HELP IF:  You are not able to take your medicines as told.  Your symptoms do not get better.  Your symptoms get worse. GET HELP RIGHT AWAY IF:  Your attacks seem different than your normal attacks.  You have thoughts about hurting yourself or others.  You take panic attack medicine and you have a side effect. MAKE SURE YOU:  Understand these instructions.  Will watch your condition.  Will get help right away if you are not doing well or get worse.   This information is not intended to replace advice given to you by your health care provider. Make sure you discuss any questions you have with your health care provider.   Document Released: 11/15/2010 Document Revised: 08/03/2013 Document Reviewed: 05/27/2013 Elsevier Interactive Patient Education 2016 [employer]. Follow up with your PCP as needed

## 2015-09-20 NOTE — ED Notes (Signed)
Pt familiarly presents with symptoms of anxiety.

## 2015-09-20 NOTE — ED Provider Notes (Signed)
CSN: [id number]     Arrival date & time 09/20/15  2226 History  By signing my name below, I, Helane Gunther, attest that this documentation has been prepared under the direction and in the presence of Garald Balding, NP. Electronically Signed: Helane Gunther, ED Scribe. 09/20/2015. 10:47 PM.    Chief Complaint  Patient presents with  . Anxiety   The history is provided by the patient. No language interpreter was used.   HPI Comments: [name] is a 49 y.o. female with a PMHx of anxiety, major depressive disorder, DM, and HTN brought in by ambulance, who presents to the Emergency Department complaining of anxiety onset tonight. Pt reports she feels as though she is going to have a stroke. She states the right side of her body feels tense and tingling, and has been for several months. She states she also has "stress" on the right side of her face. She reports associated nausea and SOB. She notes she has had a cough and nasal congestion for over a month. She states she is currently staying at a shelter. She reports she does not take medication for her anxiety or anything for allergies. Pt denies vomiting.   Past Medical History  Diagnosis Date  . Anxiety   . Major depressive disorder, recurrent, severe with psychotic features ([place])   . Diabetes mellitus without complication ([place])   . Hypertension    History reviewed. No pertinent past surgical history. Family History  Problem Relation Age of Onset  . Hypertension Other   . Diabetes Other   . Hypertension Mother   . Diabetes Mother   . Aneurysm Mother   . Hypertension Father   . Diabetes Father   . Hypertension Sister   . Diabetes Sister    Social History  Substance Use Topics  . Smoking status: Never Smoker   . Smokeless tobacco: Never Used  . Alcohol Use: No   OB History    No data available     Review of Systems  Constitutional: Negative for fever and chills.  HENT: Positive for congestion. Negative for rhinorrhea.    Respiratory: Positive for cough. Negative for shortness of breath and wheezing.   Gastrointestinal: Positive for nausea. Negative for vomiting, abdominal pain, diarrhea and constipation.  Genitourinary: Negative for dysuria.  Skin: Negative for rash.  Neurological: Negative for dizziness, weakness, numbness and headaches.  Psychiatric/Behavioral: The patient is nervous/anxious.   All other systems reviewed and are negative.   Allergies  Review of patient's allergies indicates no known allergies.  Home Medications   Prior to Admission medications   Medication Sig Start Date End Date Taking? Authorizing Provider  docusate sodium (COLACE) 100 MG capsule Take 1 capsule (100 mg total) by mouth 2 (two) times daily. Patient not taking: Reported on 07/19/2015 05/31/15   Flonnie Overman Dhungel, MD  naproxen (NAPROSYN) 500 MG tablet Take one twice a day for pain Patient not taking: Reported on 08/12/2015 08/02/15   Milton Ferguson, MD  polyethylene glycol [facility] packet Take 17 g by mouth daily. Patient not taking: Reported on 07/19/2015 05/31/15   Nishant Dhungel, MD  risperiDONE (RISPERDAL) 1 MG tablet Take 1 tablet (1 mg total) by mouth at bedtime. Patient not taking: Reported on 07/19/2015 07/05/15   Clovis Fredrickson, MD  traZODone (DESYREL) 100 MG tablet Take 1 tablet (100 mg total) by mouth at bedtime. Patient not taking: Reported on 07/19/2015 07/05/15   Clovis Fredrickson, MD   BP 123/98 mmHg  Pulse 89  Temp(Src) 98.1 F (36.7 C) (Oral)  Resp 18  SpO2 99%  LMP 12/25/2009 Physical Exam  Constitutional: She is oriented to person, place, and time. She appears well-developed and well-nourished.  Morbidly obese  HENT:  Head: Normocephalic.  Right Ear: External ear normal.  Left Ear: External ear normal.  Mouth/Throat: Oropharynx is clear and moist.  Eyes: Pupils are equal, round, and reactive to light.  Neck: Normal range of motion. Neck supple.  Cardiovascular: Normal rate.    Pulmonary/Chest: Effort normal.  Abdominal: Soft.  Musculoskeletal: Normal range of motion. She exhibits no tenderness.  Lymphadenopathy:    She has no cervical adenopathy.  Neurological: She is alert and oriented to person, place, and time. No cranial nerve deficit.  Skin: Skin is warm and dry. No rash noted.  Nursing note and vitals reviewed.   ED Course  Procedures  DIAGNOSTIC STUDIES: Oxygen Saturation is 99% on RA, normal by my interpretation.    COORDINATION OF CARE: 10:43 PM - Discussed low likelihood of stroke. Pt advised of plan for treatment and pt agrees.  Labs Review Labs Reviewed - No data to display  Imaging Review No results found. I have personally reviewed and evaluated these images and lab results as part of my medical decision-making.   EKG Interpretation None      MDM   Final diagnoses:  Anxiety   I personally performed the services described in this documentation, which was scribed in my presence. The recorded information has been reviewed and is accurate.  Junius Creamer, NP 09/20/15 [id number]  Forde Dandy, MD 09/20/15 [phone]

## 2015-09-20 NOTE — ED Notes (Signed)
Bed: WA21 Expected date:  Expected time:  Means of arrival:  Comments: 49 yo F

## 2015-09-21 ENCOUNTER — Emergency Department (HOSPITAL_COMMUNITY)
Admission: EM | Admit: 2015-09-21 | Discharge: 2015-09-21 | Disposition: A | Discharge status: Home / Self Care | Payer: Self-pay | Attending: Emergency Medicine | Admitting: Emergency Medicine

## 2015-09-21 ENCOUNTER — Encounter (HOSPITAL_COMMUNITY): Payer: Self-pay | Admitting: Emergency Medicine

## 2015-09-21 DIAGNOSIS — I10 Essential (primary) hypertension: Secondary | ICD-10-CM | POA: Insufficient documentation

## 2015-09-21 DIAGNOSIS — F333 Major depressive disorder, recurrent, severe with psychotic symptoms: Secondary | ICD-10-CM | POA: Insufficient documentation

## 2015-09-21 DIAGNOSIS — E119 Type 2 diabetes mellitus without complications: Secondary | ICD-10-CM | POA: Insufficient documentation

## 2015-09-21 DIAGNOSIS — R51 Headache: Secondary | ICD-10-CM | POA: Insufficient documentation

## 2015-09-21 DIAGNOSIS — R519 Headache, unspecified: Secondary | ICD-10-CM

## 2015-09-21 DIAGNOSIS — F419 Anxiety disorder, unspecified: Secondary | ICD-10-CM | POA: Insufficient documentation

## 2015-09-21 DIAGNOSIS — R112 Nausea with vomiting, unspecified: Secondary | ICD-10-CM | POA: Insufficient documentation

## 2015-09-21 MED ORDER — LORAZEPAM 1 MG PO TABS
1.0000 mg | ORAL_TABLET | Freq: Once | ORAL | Status: AC
  Administered 2015-09-21: 1 mg via ORAL
  Filled 2015-09-21: qty 1

## 2015-09-21 MED ORDER — KETOROLAC TROMETHAMINE 30 MG/ML IJ SOLN
30.0000 mg | Freq: Once | INTRAMUSCULAR | Status: AC
  Administered 2015-09-21: 30 mg via INTRAMUSCULAR
  Filled 2015-09-21: qty 1

## 2015-09-21 NOTE — ED Notes (Signed)
MD at bedside. 

## 2015-09-21 NOTE — ED Provider Notes (Signed)
CSN: [id number]     Arrival date & time 09/21/15  0716 History   First MD Initiated Contact with Patient 09/21/15 [phone]     Chief Complaint  Patient presents with  . Emesis     (Consider location/radiation/quality/duration/timing/severity/associated sxs/prior Treatment) HPI Patient presents with concern of right-sided headache, generalized discomfort, nausea. Symptoms of been present for months, now more present for about one day. Notably, the patient was seen within the past 12 hours for this pain, and was ambulatory, discharged, but never left the facility. She requests additional treatment given the persistency of a right-sided throbbing headache. She denies new changes over the past 12 hours, including no new syncope, confusion, actual vomiting, chest pain, visual changes. No additional medication taken since discharge.  Past Medical History  Diagnosis Date  . Anxiety   . Major depressive disorder, recurrent, severe with psychotic features ([place])   . Diabetes mellitus without complication ([place])   . Hypertension    History reviewed. No pertinent past surgical history. Family History  Problem Relation Age of Onset  . Hypertension Other   . Diabetes Other   . Hypertension Mother   . Diabetes Mother   . Aneurysm Mother   . Hypertension Father   . Diabetes Father   . Hypertension Sister   . Diabetes Sister    Social History  Substance Use Topics  . Smoking status: Never Smoker   . Smokeless tobacco: Never Used  . Alcohol Use: No   OB History    No data available     Review of Systems  Constitutional:       Per HPI, otherwise negative  HENT:       Per HPI, otherwise negative  Respiratory:       Per HPI, otherwise negative  Cardiovascular:       Per HPI, otherwise negative  Gastrointestinal: Negative for vomiting.  Endocrine:       Negative aside from HPI  Genitourinary:       Neg aside from HPI   Musculoskeletal:       Per HPI, otherwise negative  Skin:  Negative.   Neurological: Positive for headaches. Negative for syncope.      Allergies  Review of patient's allergies indicates no known allergies.  Home Medications   Prior to Admission medications   Medication Sig Start Date End Date Taking? Authorizing Provider  docusate sodium (COLACE) 100 MG capsule Take 1 capsule (100 mg total) by mouth 2 (two) times daily. Patient not taking: Reported on 07/19/2015 05/31/15   Flonnie Overman Dhungel, MD  naproxen (NAPROSYN) 500 MG tablet Take one twice a day for pain Patient not taking: Reported on 08/12/2015 08/02/15   Milton Ferguson, MD  polyethylene glycol [facility]) packet Take 17 g by mouth daily. Patient not taking: Reported on 07/19/2015 05/31/15   Nishant Dhungel, MD  risperiDONE (RISPERDAL) 1 MG tablet Take 1 tablet (1 mg total) by mouth at bedtime. Patient not taking: Reported on 07/19/2015 07/05/15   Clovis Fredrickson, MD  traZODone (DESYREL) 100 MG tablet Take 1 tablet (100 mg total) by mouth at bedtime. Patient not taking: Reported on 07/19/2015 07/05/15   Clovis Fredrickson, MD   BP 127/94 mmHg  Pulse 70  Temp(Src) 97.5 F (36.4 C) (Oral)  Resp 20  SpO2 100%  LMP 12/25/2009 Physical Exam  Constitutional: She is oriented to person, place, and time. She appears well-developed and well-nourished. No distress.  Obese female sitting on the edge of the bed awake, alert, answering  questions appropriately, moving all extremity spontaneously  HENT:  Head: Normocephalic and atraumatic.  Eyes: Conjunctivae and EOM are normal.  Cardiovascular: Normal rate and regular rhythm.   Pulmonary/Chest: Effort normal and breath sounds normal. No stridor. No respiratory distress.  Abdominal: She exhibits no distension.  Musculoskeletal: She exhibits no edema.  Neurological: She is alert and oriented to person, place, and time. She displays no atrophy and no tremor. No cranial nerve deficit or sensory deficit. She exhibits normal muscle tone. She displays no  seizure activity. Coordination and gait normal.  Skin: Skin is warm and dry.  Psychiatric: Her mood appears anxious.  Nursing note and vitals reviewed.   ED Course  Procedures (including critical care time) Chart review notable for 16 visits in 6 months, including one within the past 24 hours.  On repeat exam the patient is in no distress.  She is ambulating about the room, w improving Sx.  MDM  Patient with a history of anxiety presents with headache, anxiety. Here, the patient is awake, alert, moving all extremity spontaneously, with no evidence for neurologic dysfunction. Patient improved here with Toradol, Ativan. With no other acute new complaints, though the patient has history of anxiety, depression, she was discharged to follow-up with primary care providers, in stable condition.  Carmin Muskrat, MD 09/21/15 [phone]

## 2015-09-21 NOTE — ED Notes (Signed)
Patient c/o "im so sick", vomiting, patient is visualized spitting into an emesis bag without actually vomiting. Patient was seen last night, states she was waiting on the bus this morning but is still sick. Patient did not leave hospital property.

## 2015-09-21 NOTE — ED Notes (Signed)
Pt spitting into emesis bag. °

## 2015-09-21 NOTE — Discharge Instructions (Signed)
As discussed, your evaluation today has been largely reassuring.  But, it is important that you monitor your condition carefully, and do not hesitate to return to the ED if you develop new, or concerning changes in your condition. ? ?Otherwise, please follow-up with your physician for appropriate ongoing care. ? ?

## 2015-10-16 ENCOUNTER — Inpatient Hospital Stay: Payer: Self-pay | Admitting: Family Medicine

## 2015-11-13 ENCOUNTER — Encounter (HOSPITAL_COMMUNITY): Payer: Self-pay | Admitting: Emergency Medicine

## 2015-11-13 ENCOUNTER — Emergency Department (HOSPITAL_COMMUNITY)
Admission: EM | Admit: 2015-11-13 | Discharge: 2015-11-14 | Disposition: A | Discharge status: Home / Self Care | Payer: Self-pay | Attending: Emergency Medicine | Admitting: Emergency Medicine

## 2015-11-13 ENCOUNTER — Emergency Department (HOSPITAL_COMMUNITY): Payer: Self-pay

## 2015-11-13 DIAGNOSIS — R059 Cough, unspecified: Secondary | ICD-10-CM

## 2015-11-13 DIAGNOSIS — Y9389 Activity, other specified: Secondary | ICD-10-CM | POA: Insufficient documentation

## 2015-11-13 DIAGNOSIS — X58XXXA Exposure to other specified factors, initial encounter: Secondary | ICD-10-CM | POA: Insufficient documentation

## 2015-11-13 DIAGNOSIS — R05 Cough: Secondary | ICD-10-CM | POA: Insufficient documentation

## 2015-11-13 DIAGNOSIS — Y998 Other external cause status: Secondary | ICD-10-CM | POA: Insufficient documentation

## 2015-11-13 DIAGNOSIS — S96912A Strain of unspecified muscle and tendon at ankle and foot level, left foot, initial encounter: Secondary | ICD-10-CM

## 2015-11-13 DIAGNOSIS — Z8659 Personal history of other mental and behavioral disorders: Secondary | ICD-10-CM | POA: Insufficient documentation

## 2015-11-13 DIAGNOSIS — I10 Essential (primary) hypertension: Secondary | ICD-10-CM | POA: Insufficient documentation

## 2015-11-13 DIAGNOSIS — R32 Unspecified urinary incontinence: Secondary | ICD-10-CM | POA: Insufficient documentation

## 2015-11-13 DIAGNOSIS — Y9289 Other specified places as the place of occurrence of the external cause: Secondary | ICD-10-CM | POA: Insufficient documentation

## 2015-11-13 DIAGNOSIS — E119 Type 2 diabetes mellitus without complications: Secondary | ICD-10-CM | POA: Insufficient documentation

## 2015-11-13 LAB — CBG MONITORING, ED: Glucose-Capillary: 113 mg/dL — ABNORMAL HIGH (ref 65–99)

## 2015-11-13 NOTE — ED Notes (Signed)
Patient presents for cough, urinary incontinence, nausea, "burning" on inside of left foot. Patient unable to state how long she has been having symptoms. Denies vomiting or diarrhea.

## 2015-11-14 LAB — I-STAT CHEM 8, ED
BUN: 24 mg/dL — ABNORMAL HIGH (ref 6–20)
CALCIUM ION: 1.17 mmol/L (ref 1.12–1.23)
CREATININE: 0.9 mg/dL (ref 0.44–1.00)
Chloride: 105 mmol/L (ref 101–111)
GLUCOSE: 131 mg/dL — AB (ref 65–99)
HCT: 37 % (ref 36.0–46.0)
HEMOGLOBIN: 12.6 g/dL (ref 12.0–15.0)
Potassium: 3.8 mmol/L (ref 3.5–5.1)
Sodium: 141 mmol/L (ref 135–145)
TCO2: 25 mmol/L (ref 0–100)

## 2015-11-14 LAB — URINALYSIS, ROUTINE W REFLEX MICROSCOPIC
Glucose, UA: NEGATIVE mg/dL
HGB URINE DIPSTICK: NEGATIVE
KETONES UR: NEGATIVE mg/dL
Nitrite: NEGATIVE
PROTEIN: NEGATIVE mg/dL
Specific Gravity, Urine: 1.035 — ABNORMAL HIGH (ref 1.005–1.030)
pH: 5 (ref 5.0–8.0)

## 2015-11-14 LAB — URINE MICROSCOPIC-ADD ON

## 2015-11-14 MED ORDER — ONDANSETRON 8 MG PO TBDP
8.0000 mg | ORAL_TABLET | Freq: Once | ORAL | Status: AC
Start: 2015-11-14 — End: 2015-11-14
  Administered 2015-11-14: 8 mg via ORAL
  Filled 2015-11-14: qty 1

## 2015-11-14 MED ORDER — IBUPROFEN 600 MG PO TABS: 600.0000 mg | ORAL_TABLET | Freq: Four times a day (QID) | ORAL | Status: DC | PRN

## 2015-11-14 MED ORDER — ALBUTEROL SULFATE HFA 108 (90 BASE) MCG/ACT IN AERS: 2.0000 | INHALATION_SPRAY | RESPIRATORY_TRACT | Status: DC | PRN

## 2015-11-14 NOTE — ED Notes (Signed)
Discharge instructions, follow up care, and rx x1 reviewed with patient. Patient verbalized understanding. 

## 2015-11-14 NOTE — ED Provider Notes (Signed)
CSN: [id number]     Arrival date & time 11/13/15  2045 History   First MD Initiated Contact with Patient 11/14/15 0049     Chief Complaint  Patient presents with  . Cough     (Consider location/radiation/quality/duration/timing/severity/associated sxs/prior Treatment) Patient is a 50 y.o. female presenting with cough. The history is provided by the patient. No language interpreter was used.  Cough Cough characteristics:  Dry Duration:  4 weeks Timing:  Constant Chronicity:  Recurrent Smoker: no   Relieved by:  Nothing Worsened by:  Nothing tried Ineffective treatments:  None tried Associated symptoms: no chest pain, no chills, no fever, no myalgias, no shortness of breath and no wheezing   Associated symptoms comment:  Patient complains of cough that is persistent, dry and ongoing for multiple weeks. No congestion or fever. She is a non-smoker. She has small amounts of urinary incontinence when she coughs and reports the urine is foul smelling.  She also complains of left foot pain and swelling that started earlier today. No known injury.  She also reports she feels she is dehydrated. No vomiting or diarrhea. No fever. She states she was admitted in the past for dehydration and she feels the same way now.    Past Medical History  Diagnosis Date  . Anxiety   . Major depressive disorder, recurrent, severe with psychotic features ([place])   . Diabetes mellitus without complication ([place])   . Hypertension    History reviewed. No pertinent past surgical history. Family History  Problem Relation Age of Onset  . Hypertension Other   . Diabetes Other   . Hypertension Mother   . Diabetes Mother   . Aneurysm Mother   . Hypertension Father   . Diabetes Father   . Hypertension Sister   . Diabetes Sister    Social History  Substance Use Topics  . Smoking status: Never Smoker   . Smokeless tobacco: Never Used  . Alcohol Use: No   OB History    No data available     Review of  Systems  Constitutional: Negative for fever and chills.  HENT: Negative.  Negative for congestion.   Respiratory: Positive for cough. Negative for shortness of breath and wheezing.   Cardiovascular: Negative.  Negative for chest pain.  Gastrointestinal: Negative.  Negative for nausea and vomiting.  Genitourinary: Positive for enuresis.       See HPI.  Musculoskeletal: Negative for myalgias.       Left foot pain and swelling.  Skin: Negative.  Negative for color change and wound.  Neurological: Negative.       Allergies  Review of patient's allergies indicates no known allergies.  Home Medications   Prior to Admission medications   Medication Sig Start Date End Date Taking? Authorizing Provider  docusate sodium (COLACE) 100 MG capsule Take 1 capsule (100 mg total) by mouth 2 (two) times daily. Patient not taking: Reported on 07/19/2015 05/31/15   Flonnie Overman Dhungel, MD  naproxen (NAPROSYN) 500 MG tablet Take one twice a day for pain Patient not taking: Reported on 08/12/2015 08/02/15   Milton Ferguson, MD  polyethylene glycol [facility]) packet Take 17 g by mouth daily. Patient not taking: Reported on 07/19/2015 05/31/15   Nishant Dhungel, MD  risperiDONE (RISPERDAL) 1 MG tablet Take 1 tablet (1 mg total) by mouth at bedtime. Patient not taking: Reported on 07/19/2015 07/05/15   Clovis Fredrickson, MD  traZODone (DESYREL) 100 MG tablet Take 1 tablet (100 mg total) by mouth at  bedtime. Patient not taking: Reported on 07/19/2015 07/05/15   Clovis Fredrickson, MD   BP 130/63 mmHg  Pulse 92  Temp(Src) 100 F (37.8 C) (Oral)  Resp 18  Ht 5' 5.5" (1.664 m)  Wt 99.791 kg  BMI 36.04 kg/m2  SpO2 99%  LMP 12/25/2009 Physical Exam  Constitutional: She is oriented to person, place, and time. She appears well-developed and well-nourished. No distress.  HENT:  Head: Normocephalic.  Mouth/Throat: Oropharynx is clear and moist.  Eyes: Conjunctivae are normal.  Neck: Normal range of motion. Neck  supple.  Cardiovascular: Normal rate and regular rhythm.   Pulmonary/Chest: Effort normal. She has no wheezes. She has no rales. She exhibits no tenderness.  Abdominal: Soft. Bowel sounds are normal. There is no tenderness. There is no rebound and no guarding.  Musculoskeletal: Normal range of motion.  Left foot mildly swollen over medial and proximal aspect. No warmth. No wound, ulceration, drainage, redness. FROM.  Neurological: She is alert and oriented to person, place, and time.  Skin: Skin is warm and dry. No rash noted.  Psychiatric: She has a normal mood and affect.    ED Course  Procedures (including critical care time) Labs Review Labs Reviewed  CBG MONITORING, ED - Abnormal; Notable for the following:    Glucose-Capillary 113 (*)    All other components within normal limits  I-STAT CHEM 8, ED - Abnormal; Notable for the following:    BUN 24 (*)    Glucose, Bld 131 (*)    All other components within normal limits  URINALYSIS, ROUTINE W REFLEX MICROSCOPIC (NOT AT [facility])  CBG MONITORING, ED   Results for orders placed or performed during the hospital encounter of 11/13/15  Urinalysis, Routine w reflex microscopic  Result Value Ref Range   Color, Urine YELLOW YELLOW   APPearance CLOUDY (A) CLEAR   Specific Gravity, Urine 1.035 (H) 1.005 - 1.030   pH 5.0 5.0 - 8.0   Glucose, UA NEGATIVE NEGATIVE mg/dL   Hgb urine dipstick NEGATIVE NEGATIVE   Bilirubin Urine SMALL (A) NEGATIVE   Ketones, ur NEGATIVE NEGATIVE mg/dL   Protein, ur NEGATIVE NEGATIVE mg/dL   Nitrite NEGATIVE NEGATIVE   Leukocytes, UA TRACE (A) NEGATIVE  Urine microscopic-add on  Result Value Ref Range   Squamous Epithelial / LPF 0-5 (A) NONE SEEN   WBC, UA 0-5 0 - 5 WBC/hpf   RBC / HPF 0-5 0 - 5 RBC/hpf   Bacteria, UA FEW (A) NONE SEEN   Crystals CA OXALATE CRYSTALS (A) NEGATIVE  CBG monitoring, ED  Result Value Ref Range   Glucose-Capillary 113 (H) 65 - 99 mg/dL  I-stat Chem 8, ED  Result Value Ref  Range   Sodium 141 135 - 145 mmol/L   Potassium 3.8 3.5 - 5.1 mmol/L   Chloride 105 101 - 111 mmol/L   BUN 24 (H) 6 - 20 mg/dL   Creatinine, Ser 0.90 0.44 - 1.00 mg/dL   Glucose, Bld 131 (H) 65 - 99 mg/dL   Calcium, Ion 1.17 1.12 - 1.23 mmol/L   TCO2 25 0 - 100 mmol/L   Hemoglobin 12.6 12.0 - 15.0 g/dL   HCT 37.0 36.0 - 46.0 %    Imaging Review Dg Chest 2 View  11/13/2015  CLINICAL DATA:  50 year old female with productive cough for the past 2 weeks. Shortness of breath and fever. EXAM: CHEST  2 VIEW COMPARISON:  Chest x-ray 06/26/2015. FINDINGS: Lung volumes are low. No consolidative airspace disease. No pleural  effusions. No pneumothorax. No pulmonary nodule or mass noted. Pulmonary vasculature and the cardiomediastinal silhouette are within normal limits. IMPRESSION: 1. Low lung volumes without radiographic evidence of acute cardiopulmonary disease. Electronically Signed   By: Vinnie Langton M.D.   On: 11/13/2015 21:40   I have personally reviewed and evaluated these images and lab results as part of my medical decision-making.   EKG Interpretation None      MDM   Final diagnoses:  None    1. Cough 2. Foot pain  The patient has a clear CXR, no hypoxia or tachypnea/tachycardia. No evidence of pulmonary problem. Will provide inhaler for symptomatic relief of cough  The foot does not appear cellulitic or infected. No lesions, skin breakdown. There is some swelling and tenderness - likely musculoskeletal. Will provide ibuprofen and care instructions.  UA negative for infection. There has been no urinary incontinence issues while in the ED. Likely related to stress incontinence with cough.     Charlann Lange, PA-C 11/14/15 0246  Quintella Reichert, MD 11/15/15 2002

## 2015-11-14 NOTE — ED Notes (Signed)
Patient getting dressed in room

## 2015-11-14 NOTE — Discharge Instructions (Signed)
Cough, Adult Coughing is a reflex that clears your throat and your airways. Coughing helps to heal and protect your lungs. It is normal to cough occasionally, but a cough that happens with other symptoms or lasts a long time may be a sign of a condition that needs treatment. A cough may last only 2-3 weeks (acute), or it may last longer than 8 weeks (chronic). CAUSES Coughing is commonly caused by:  Breathing in substances that irritate your lungs.  A viral or bacterial respiratory infection.  Allergies.  Asthma.  Postnasal drip.  Smoking.  Acid backing up from the stomach into the esophagus (gastroesophageal reflux).  Certain medicines.  Chronic lung problems, including COPD (or rarely, lung cancer).  Other medical conditions such as heart failure. HOME CARE INSTRUCTIONS  Pay attention to any changes in your symptoms. Take these actions to help with your discomfort:  Take medicines only as told by your health care provider.  If you were prescribed an antibiotic medicine, take it as told by your health care provider. Do not stop taking the antibiotic even if you start to feel better.  Talk with your health care provider before you take a cough suppressant medicine.  Drink enough fluid to keep your urine clear or pale yellow.  If the air is dry, use a cold steam vaporizer or humidifier in your bedroom or your home to help loosen secretions.  Avoid anything that causes you to cough at work or at home.  If your cough is worse at night, try sleeping in a semi-upright position.  Avoid cigarette smoke. If you smoke, quit smoking. If you need help quitting, ask your health care provider.  Avoid caffeine.  Avoid alcohol.  Rest as needed. SEEK MEDICAL CARE IF:   You have new symptoms.  You cough up pus.  Your cough does not get better after 2-3 weeks, or your cough gets worse.  You cannot control your cough with suppressant medicines and you are losing sleep.  You  develop pain that is getting worse or pain that is not controlled with pain medicines.  You have a fever.  You have unexplained weight loss.  You have night sweats. SEEK IMMEDIATE MEDICAL CARE IF:  You cough up blood.  You have difficulty breathing.  Your heartbeat is very fast.   This information is not intended to replace advice given to you by your health care provider. Make sure you discuss any questions you have with your health care provider.   Document Released: 04/11/2011 Document Revised: 07/04/2015 Document Reviewed: 12/20/2014 Elsevier Interactive Patient Education 2016 [place]. Cryotherapy Cryotherapy means treatment with cold. Ice or gel packs can be used to reduce both pain and swelling. Ice is the most helpful within the first 24 to 48 hours after an injury or flare-up from overusing a muscle or joint. Sprains, strains, spasms, burning pain, shooting pain, and aches can all be eased with ice. Ice can also be used when recovering from surgery. Ice is effective, has very few side effects, and is safe for most people to use. PRECAUTIONS  Ice is not a safe treatment option for people with:  Raynaud phenomenon. This is a condition affecting small blood vessels in the extremities. Exposure to cold may cause your problems to return.  Cold hypersensitivity. There are many forms of cold hypersensitivity, including:  Cold urticaria. Red, itchy hives appear on the skin when the tissues begin to warm after being iced.  Cold erythema. This is a red, itchy rash  caused by exposure to cold.  Cold hemoglobinuria. Red blood cells break down when the tissues begin to warm after being iced. The hemoglobin that carry oxygen are passed into the urine because they cannot combine with blood proteins fast enough.  Numbness or altered sensitivity in the area being iced. If you have any of the following conditions, do not use ice until you have discussed cryotherapy with your  caregiver:  Heart conditions, such as arrhythmia, angina, or chronic heart disease.  High blood pressure.  Healing wounds or open skin in the area being iced.  Current infections.  Rheumatoid arthritis.  Poor circulation.  Diabetes. Ice slows the blood flow in the region it is applied. This is beneficial when trying to stop inflamed tissues from spreading irritating chemicals to surrounding tissues. However, if you expose your skin to cold temperatures for too long or without the proper protection, you can damage your skin or nerves. Watch for signs of skin damage due to cold. HOME CARE INSTRUCTIONS Follow these tips to use ice and cold packs safely.  Place a dry or damp towel between the ice and skin. A damp towel will cool the skin more quickly, so you may need to shorten the time that the ice is used.  For a more rapid response, add gentle compression to the ice.  Ice for no more than 10 to 20 minutes at a time. The bonier the area you are icing, the less time it will take to get the benefits of ice.  Check your skin after 5 minutes to make sure there are no signs of a poor response to cold or skin damage.  Rest 20 minutes or more between uses.  Once your skin is numb, you can end your treatment. You can test numbness by very lightly touching your skin. The touch should be so light that you do not see the skin dimple from the pressure of your fingertip. When using ice, most people will feel these normal sensations in this order: cold, burning, aching, and numbness.  Do not use ice on someone who cannot communicate their responses to pain, such as small children or people with dementia. HOW TO MAKE AN ICE PACK Ice packs are the most common way to use ice therapy. Other methods include ice massage, ice baths, and cryosprays. Muscle creams that cause a cold, tingly feeling do not offer the same benefits that ice offers and should not be used as a substitute unless recommended by your  caregiver. To make an ice pack, do one of the following:  Place crushed ice or a bag of frozen vegetables in a sealable plastic bag. Squeeze out the excess air. Place this bag inside another plastic bag. Slide the bag into a pillowcase or place a damp towel between your skin and the bag.  Mix 3 parts water with 1 part rubbing alcohol. Freeze the mixture in a sealable plastic bag. When you remove the mixture from the freezer, it will be slushy. Squeeze out the excess air. Place this bag inside another plastic bag. Slide the bag into a pillowcase or place a damp towel between your skin and the bag. SEEK MEDICAL CARE IF:  You develop [place] spots on your skin. This may give the skin a blotchy (mottled) appearance.  Your skin turns blue or pale.  Your skin becomes waxy or hard.  Your swelling gets worse. MAKE SURE YOU:   Understand these instructions.  Will watch your condition.  Will get help right  away if you are not doing well or get worse.   This information is not intended to replace advice given to you by your health care provider. Make sure you discuss any questions you have with your health care provider.   Document Released: 06/09/2011 Document Revised: 11/03/2014 Document Reviewed: 06/09/2011 Elsevier Interactive Patient Education 2016 [place] Sprain A foot sprain is an injury to one of the strong bands of tissue (ligaments) that connect and support the many bones in your feet. The ligament can be stretched too much or it can tear. A tear can be either partial or complete. The severity of the sprain depends on how much of the ligament was damaged or torn. CAUSES A foot sprain is usually caused by suddenly twisting or pivoting your foot. RISK FACTORS This injury is more likely to occur in people who:  Play a sport, such as basketball or football.  Exercise or play a sport without warming up.  Start a new workout or sport.  Suddenly increase how long or hard  they exercise or play a sport. SYMPTOMS Symptoms of this condition start soon after an injury and include:  Pain, especially in the arch of the foot.  Bruising.  Swelling.  Inability to walk or use the foot to support body weight. DIAGNOSIS This condition is diagnosed with a medical history and physical exam. You may also have imaging tests, such as:  X-rays to make sure there are no broken bones (fractures).  MRI to see if the ligament has torn. TREATMENT Treatment varies depending on the severity of your sprain. Mild sprains can be treated with rest, ice, compression, and elevation (RICE). If your ligament is overstretched or partially torn, treatment usually involves keeping your foot in a fixed position (immobilization) for a period of time. To help you do this, your health care provider will apply a bandage, splint, or walking boot to keep your foot from moving until it heals. You may also be advised to use crutches or a scooter for a few weeks to avoid bearing weight on your foot while it is healing. If your ligament is fully torn, you may need surgery to reconnect the ligament to the bone. After surgery, a cast or splint will be applied and will need to stay on your foot while it heals. Your health care provider may also suggest exercises or physical therapy to strengthen your foot. HOME CARE INSTRUCTIONS If You Have a Bandage, Splint, or Walking Boot:  Wear it as directed by your health care provider. Remove it only as directed by your health care provider.  Loosen the bandage, splint, or walking boot if your toes become numb and tingle, or if they turn cold and blue. Bathing  If your health care provider approves bathing and showering, cover the bandage or splint with a watertight plastic bag to protect it from water. Do not let the bandage or splint get wet. Managing Pain, Stiffness, and Swelling   If directed, apply ice to the injured area:  Put ice in a plastic  bag.  Place a towel between your skin and the bag.  Leave the ice on for 20 minutes, 2-3 times per day.  Move your toes often to avoid stiffness and to lessen swelling.  Raise (elevate) the injured area above the level of your heart while you are sitting or lying down. Driving  Do not drive or operate heavy machinery while taking pain medicine.  Do not drive while wearing a bandage, splint,  or walking boot on a foot that you use for driving. Activity  Rest as directed by your health care provider.  Do not use the injured foot to support your body weight until your health care provider says that you can. Use crutches or other supportive devices as directed by your health care provider.  Ask your health care provider what activities are safe for you. Gradually increase how much and how far you walk until your health care provider says it is safe to return to full activity.  Do any exercise or physical therapy as directed by your health care provider. General Instructions  If a splint was applied, do not put pressure on any part of it until it is fully hardened. This may take several hours.  Take medicines only as directed by your health care provider. These include over-the-counter medicines and prescription medicines.  Keep all follow-up visits as directed by your health care provider. This is important.  When you can walk without pain, wear supportive shoes that have stiff soles. Do not wear flip-flops, and do not walk barefoot. SEEK MEDICAL CARE IF:  Your pain is not controlled with medicine.  Your bruising or swelling gets worse or does not get better with treatment.  Your splint or walking boot is damaged. SEEK IMMEDIATE MEDICAL CARE IF:  Your foot is numb or blue.  Your foot feels colder than normal.   This information is not intended to replace advice given to you by your health care provider. Make sure you discuss any questions you have with your health care  provider.   Document Released: 04/04/2002 Document Revised: 02/27/2015 Document Reviewed: 08/16/2014 Elsevier Interactive Patient Education [employer].

## 2015-11-14 NOTE — ED Notes (Signed)
Pt is actively eating crackers and drinking cranberry juice.

## 2015-11-16 ENCOUNTER — Encounter (HOSPITAL_COMMUNITY): Payer: Self-pay | Admitting: Emergency Medicine

## 2015-11-16 DIAGNOSIS — G4700 Insomnia, unspecified: Secondary | ICD-10-CM | POA: Insufficient documentation

## 2015-11-16 DIAGNOSIS — I10 Essential (primary) hypertension: Secondary | ICD-10-CM | POA: Insufficient documentation

## 2015-11-16 DIAGNOSIS — F332 Major depressive disorder, recurrent severe without psychotic features: Secondary | ICD-10-CM | POA: Insufficient documentation

## 2015-11-16 DIAGNOSIS — F419 Anxiety disorder, unspecified: Secondary | ICD-10-CM | POA: Insufficient documentation

## 2015-11-16 DIAGNOSIS — E119 Type 2 diabetes mellitus without complications: Secondary | ICD-10-CM | POA: Insufficient documentation

## 2015-11-16 DIAGNOSIS — Z3202 Encounter for pregnancy test, result negative: Secondary | ICD-10-CM | POA: Insufficient documentation

## 2015-11-16 NOTE — ED Notes (Signed)
Pt states she feels like people are against her.  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  States she is suicidal with a plan to cut her wrist.

## 2015-11-17 ENCOUNTER — Emergency Department (HOSPITAL_COMMUNITY)
Admission: EM | Admit: 2015-11-17 | Discharge: 2015-11-18 | Disposition: A | Discharge status: Home / Self Care | Payer: No Typology Code available for payment source | Attending: Emergency Medicine | Admitting: Emergency Medicine

## 2015-11-17 DIAGNOSIS — F4325 Adjustment disorder with mixed disturbance of emotions and conduct: Secondary | ICD-10-CM | POA: Diagnosis present

## 2015-11-17 DIAGNOSIS — Z5900 Homelessness unspecified: Secondary | ICD-10-CM

## 2015-11-17 DIAGNOSIS — F332 Major depressive disorder, recurrent severe without psychotic features: Secondary | ICD-10-CM

## 2015-11-17 DIAGNOSIS — F22 Delusional disorders: Secondary | ICD-10-CM | POA: Diagnosis present

## 2015-11-17 LAB — CBC
HCT: 34.6 % — ABNORMAL LOW (ref 36.0–46.0)
HEMOGLOBIN: 10.8 g/dL — AB (ref 12.0–15.0)
MCH: 25.1 pg — ABNORMAL LOW (ref 26.0–34.0)
MCHC: 31.2 g/dL (ref 30.0–36.0)
MCV: 80.5 fL (ref 78.0–100.0)
Platelets: 313 10*3/uL (ref 150–400)
RBC: 4.3 MIL/uL (ref 3.87–5.11)
RDW: 14.1 % (ref 11.5–15.5)
WBC: 9.2 10*3/uL (ref 4.0–10.5)

## 2015-11-17 LAB — RAPID URINE DRUG SCREEN, HOSP PERFORMED
Amphetamines: NOT DETECTED
BENZODIAZEPINES: NOT DETECTED
Barbiturates: NOT DETECTED
COCAINE: NOT DETECTED
OPIATES: NOT DETECTED
Tetrahydrocannabinol: NOT DETECTED

## 2015-11-17 LAB — COMPREHENSIVE METABOLIC PANEL
ALBUMIN: 3.7 g/dL (ref 3.5–5.0)
ALK PHOS: 134 U/L — AB (ref 38–126)
ALT: 26 U/L (ref 14–54)
ANION GAP: 14 (ref 5–15)
AST: 27 U/L (ref 15–41)
BUN: 12 mg/dL (ref 6–20)
CALCIUM: 9.3 mg/dL (ref 8.9–10.3)
CO2: 24 mmol/L (ref 22–32)
Chloride: 100 mmol/L — ABNORMAL LOW (ref 101–111)
Creatinine, Ser: 0.87 mg/dL (ref 0.44–1.00)
GFR calc non Af Amer: >60 mL/min (ref >60)
GLUCOSE: 128 mg/dL — AB (ref 65–99)
POTASSIUM: 4 mmol/L (ref 3.5–5.1)
SODIUM: 138 mmol/L (ref 135–145)
TOTAL PROTEIN: 7.4 g/dL (ref 6.5–8.1)
Total Bilirubin: 0.5 mg/dL (ref 0.3–1.2)

## 2015-11-17 LAB — ETHANOL: Alcohol, Ethyl (B): <5 mg/dL (ref <5)

## 2015-11-17 LAB — POC URINE PREG, ED: Preg Test, Ur: NEGATIVE

## 2015-11-17 LAB — ACETAMINOPHEN LEVEL

## 2015-11-17 LAB — SALICYLATE LEVEL

## 2015-11-17 MED ORDER — TRAZODONE HCL 100 MG PO TABS
100.0000 mg | ORAL_TABLET | Freq: Every day | ORAL | Status: DC
  Administered 2015-11-17 (×2): 100 mg via ORAL
  Filled 2015-11-17 (×2): qty 1

## 2015-11-17 MED ORDER — ACETAMINOPHEN 325 MG PO TABS
650.0000 mg | ORAL_TABLET | Freq: Four times a day (QID) | ORAL | Status: DC | PRN
  Administered 2015-11-17 – 2015-11-18 (×2): 650 mg via ORAL
  Filled 2015-11-17: qty 2

## 2015-11-17 MED ORDER — LORAZEPAM 1 MG PO TABS
1.0000 mg | ORAL_TABLET | Freq: Four times a day (QID) | ORAL | Status: DC | PRN
  Administered 2015-11-18: 1 mg via ORAL
  Filled 2015-11-17 (×2): qty 1

## 2015-11-17 MED ORDER — RISPERIDONE 0.5 MG PO TABS
1.0000 mg | ORAL_TABLET | Freq: Every day | ORAL | Status: DC
  Administered 2015-11-17 (×2): 1 mg via ORAL
  Filled 2015-11-17 (×2): qty 2

## 2015-11-17 NOTE — ED Notes (Signed)
Pt aware of need for urine specimen. Per sitter, pt had incont episode of urine and voided rest in toilet. Sitter and Pt was not aware of need for urine specimen at the time. Sitter advised pt refused to ambulate and was escorted to bathroom in w/c. RN encouraged pt to shower - pt states she will later and advised pt she needs to ambulate. Pt closed her eyes and turned over in bed on her right side.

## 2015-11-17 NOTE — ED Notes (Signed)
Pt refusing to talk to RN's x 2. Sitter advised pt asking for sedative prior to RN entering room. RN advised pt no sedatives are given during the day and advised pt she needs to ambulate to the bathroom - rather than using w/c. Pt noted w/no eye contact and will not speak to RN. Pt allowed RN to obtain her VS's.
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
## 2015-11-17 NOTE — Progress Notes (Signed)
Disposition CSW completed patient referrals to the following inpatient psych facilities:  [facility] will continue to follow patient for placement needs.  [facility] Disposition CSW [phone]

## 2015-11-17 NOTE — ED Notes (Signed)
Pt eating lunch. Soda given as requested.

## 2015-11-17 NOTE — ED Notes (Signed)
Per sitter, pt advised she felt like screaming and requesting medication. When RN entered room just a few minutes later, pt noted to be lying on bed w/eyes closed. Respirations even, unlabored.

## 2015-11-17 NOTE — ED Notes (Addendum)
RN rounding on pt, RN and sitter noted pt to be having a conversation with someone,(no one present other than sitter and RN), RN began speaking with pt, pt immediately refuses to speak with RN.  RN encouraged pt to shower today.

## 2015-11-18 ENCOUNTER — Emergency Department (HOSPITAL_COMMUNITY): Admission: EM | Admit: 2015-11-18 | Discharge: 2015-11-18 | Discharge status: Absent without leave | Payer: Self-pay

## 2015-11-18 DIAGNOSIS — F4325 Adjustment disorder with mixed disturbance of emotions and conduct: Secondary | ICD-10-CM | POA: Diagnosis not present

## 2015-11-18 DIAGNOSIS — Z5900 Homelessness unspecified: Secondary | ICD-10-CM

## 2015-11-18 DIAGNOSIS — F22 Delusional disorders: Secondary | ICD-10-CM | POA: Diagnosis not present

## 2015-11-18 DIAGNOSIS — F332 Major depressive disorder, recurrent severe without psychotic features: Secondary | ICD-10-CM | POA: Diagnosis not present

## 2015-11-18 MED ORDER — BENZONATATE 100 MG PO CAPS
100.0000 mg | ORAL_CAPSULE | Freq: Three times a day (TID) | ORAL | Status: DC | PRN
  Administered 2015-11-18: 100 mg via ORAL
  Filled 2015-11-18: qty 1

## 2015-11-18 NOTE — Progress Notes (Signed)
CSW spoke with patient regarding her living situation. Patient reports she is homeless and has been since Friday. CSW asked patient if she is interested in resources for homeless shelters, and patient accepted resources. Patient stated she does not have a way from the hospital. CSW will provide RN with bus pass for the patient. Patient reports she has been kicked out of the [facility] as well as [facility] due to legal issues. CSW encouraged patient to seek assistance from other local shelters within the area. Patient is aware and understanding of process. No further CSW needs were requested at this time. CSW to sign off.   Lucius Conn, [facility] Ph: [phone]

## 2015-11-18 NOTE — ED Notes (Signed)
Sitter at bedside.

## 2015-11-18 NOTE — ED Notes (Signed)
Care management at bedside talking with patient.

## 2015-11-18 NOTE — ED Notes (Signed)
This RN witnessed patient walk from bed to wheelchair far in the hall in order to assess ambulation.  Patient steady on feet, but choosing to grab out for wall when ambulating.  PAtient able to stand, dress, and move to wheelchair independently.

## 2015-11-18 NOTE — ED Notes (Signed)
Called Social Work and Case Management to help discharge placement. No one has picked up. No one answering.

## 2015-11-18 NOTE — Discharge Instructions (Signed)
Major Depressive Disorder Major depressive disorder is a mental illness. It also may be called clinical depression or unipolar depression. Major depressive disorder usually causes feelings of sadness, hopelessness, or helplessness. Some people with this disorder do not feel particularly sad but lose interest in doing things they used to enjoy (anhedonia). Major depressive disorder also can cause physical symptoms. It can interfere with work, school, relationships, and other normal everyday activities. The disorder varies in severity but is longer lasting and more serious than the sadness we all feel from time to time in our lives. Major depressive disorder often is triggered by stressful life events or major life changes. Examples of these triggers include divorce, loss of your job or home, a move, and the death of a family member or close friend. Sometimes this disorder occurs for no obvious reason at all. People who have family members with major depressive disorder or bipolar disorder are at higher risk for developing this disorder, with or without life stressors. Major depressive disorder can occur at any age. It may occur just once in your life (single episode major depressive disorder). It may occur multiple times (recurrent major depressive disorder). SYMPTOMS People with major depressive disorder have either anhedonia or depressed mood on nearly a daily basis for at least 2 weeks or longer. Symptoms of depressed mood include:  Feelings of sadness (blue or down in the dumps) or emptiness.  Feelings of hopelessness or helplessness.  Tearfulness or episodes of crying (may be observed by others).  Irritability (children and adolescents). In addition to depressed mood or anhedonia or both, people with this disorder have at least four of the following symptoms:  Difficulty sleeping or sleeping too much.   Significant change (increase or decrease) in appetite or weight.   Lack of energy or  motivation.  Feelings of guilt and worthlessness.   Difficulty concentrating, remembering, or making decisions.  Unusually slow movement (psychomotor retardation) or restlessness (as observed by others).   Recurrent wishes for death, recurrent thoughts of self-harm (suicide), or a suicide attempt. People with major depressive disorder commonly have persistent negative thoughts about themselves, other people, and the world. People with severe major depressive disorder may experiencedistorted beliefs or perceptions about the world (psychotic delusions). They also may see or hear things that are not real (psychotic hallucinations). DIAGNOSIS Major depressive disorder is diagnosed through an assessment by your health care provider. Your health care provider will ask aboutaspects of your daily life, such as mood,sleep, and appetite, to see if you have the diagnostic symptoms of major depressive disorder. Your health care provider may ask about your medical history and use of alcohol or drugs, including prescription medicines. Your health care provider also may do a physical exam and blood work. This is because certain medical conditions and the use of certain substances can cause major depressive disorder-like symptoms (secondary depression). Your health care provider also may refer you to a mental health specialist for further evaluation and treatment. TREATMENT It is important to recognize the symptoms of major depressive disorder and seek treatment. The following treatments can be prescribed for this disorder:   Medicine. Antidepressant medicines usually are prescribed. Antidepressant medicines are thought to correct chemical imbalances in the brain that are commonly associated with major depressive disorder. Other types of medicine may be added if the symptoms do not respond to antidepressant medicines alone or if psychotic delusions or hallucinations occur.  Talk therapy. Talk therapy can be  helpful in treating major depressive disorder by providing   support, education, and guidance. Certain types of talk therapy also can help with negative thinking (cognitive behavioral therapy) and with relationship issues that trigger this disorder (interpersonal therapy). A mental health specialist can help determine which treatment is best for you. Most people with major depressive disorder do well with a combination of medicine and talk therapy. Treatments involving electrical stimulation of the brain can be used in situations with extremely severe symptoms or when medicine and talk therapy do not work over time. These treatments include electroconvulsive therapy, transcranial magnetic stimulation, and vagal nerve stimulation.   This information is not intended to replace advice given to you by your health care provider. Make sure you discuss any questions you have with your health care provider.   Document Released: 02/07/2013 Document Revised: 11/03/2014 Document Reviewed: 02/07/2013 Elsevier Interactive Patient Education 2016 [employer]  

## 2015-11-18 NOTE — ED Notes (Signed)
Pt given clothing and belongings to change.  Patient sat in room without changing for a while.  Patient then told sitter she needed assistance.  Sitter moved items closer to patient, and patient sitting on bed crying, not attempting to dress.

## 2015-11-18 NOTE — ED Notes (Signed)
Patient verbalized understanding of discharge paperwork.  Signature pad in room and on hallway machine not working.  Patient stated she did not have any questions.

## 2015-11-18 NOTE — ED Notes (Signed)
Pt was discharged earlier but came back to the desk to check in. Keeps dancing and saying she needs to use the restroom while registration is trying to check her back in. Explained to the patient that she should either go to the restroom first or finish allowing registration to get her checked in and pt verbalizes understanding.   Pt then does the same thing a few seconds later. After finished registering, pt begins talking about her harrassment cases and asked the patient if she still needed to use the restroom. Pt then went to the bathroom.
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
## 2015-11-18 NOTE — ED Notes (Signed)
Consulted Campos MD for patient's cough, afebrile at this time. See new orders.

## 2015-11-18 NOTE — ED Notes (Signed)
Pt awake wanting something to sleep  Numbness and tingling in both her legs

## 2015-11-18 NOTE — ED Notes (Signed)
Pt requesting to eat dinner tray before leaving, which was delivered during discharge.  Patient has nibbled and states she has had enough.

## 2015-11-18 NOTE — ED Notes (Signed)
Sitter at the bedside.  Pt sleeping 

## 2015-11-18 NOTE — ED Notes (Signed)
Pt was discharged tonight and states she was unable to get to bus in time.   Pt had been offered a cab voucher but didn't have anywhere to go since she is homeless.  Pt states she has a job interview in the morning.  Spoke with Dr. Oleta Mouse regarding pt.  Pt agreeable to wait in waiting room to talk with social worker in the morning.

## 2015-11-18 NOTE — ED Notes (Signed)
Sitter at the bedsisde

## 2015-11-18 NOTE — ED Notes (Signed)
Ativan given for sleep

## 2015-12-03 ENCOUNTER — Emergency Department (HOSPITAL_COMMUNITY)
Admission: EM | Admit: 2015-12-03 | Discharge: 2015-12-03 | Disposition: A | Discharge status: Home / Self Care | Payer: Self-pay | Attending: Emergency Medicine | Admitting: Emergency Medicine

## 2015-12-03 ENCOUNTER — Encounter (HOSPITAL_COMMUNITY): Payer: Self-pay | Admitting: Emergency Medicine

## 2015-12-03 DIAGNOSIS — R55 Syncope and collapse: Secondary | ICD-10-CM | POA: Insufficient documentation

## 2015-12-03 DIAGNOSIS — R42 Dizziness and giddiness: Secondary | ICD-10-CM | POA: Insufficient documentation

## 2015-12-03 DIAGNOSIS — I10 Essential (primary) hypertension: Secondary | ICD-10-CM | POA: Insufficient documentation

## 2015-12-03 DIAGNOSIS — R5382 Chronic fatigue, unspecified: Secondary | ICD-10-CM

## 2015-12-03 DIAGNOSIS — R5383 Other fatigue: Secondary | ICD-10-CM | POA: Insufficient documentation

## 2015-12-03 DIAGNOSIS — E119 Type 2 diabetes mellitus without complications: Secondary | ICD-10-CM | POA: Insufficient documentation

## 2015-12-03 DIAGNOSIS — Z8659 Personal history of other mental and behavioral disorders: Secondary | ICD-10-CM | POA: Insufficient documentation

## 2015-12-03 DIAGNOSIS — R0981 Nasal congestion: Secondary | ICD-10-CM | POA: Insufficient documentation

## 2015-12-03 LAB — BASIC METABOLIC PANEL
Anion gap: 10 (ref 5–15)
BUN: 28 mg/dL — ABNORMAL HIGH (ref 6–20)
CO2: 25 mmol/L (ref 22–32)
Calcium: 9.5 mg/dL (ref 8.9–10.3)
Chloride: 108 mmol/L (ref 101–111)
Creatinine, Ser: 0.9 mg/dL (ref 0.44–1.00)
GFR calc Af Amer: >60 mL/min (ref >60)
GFR calc non Af Amer: >60 mL/min (ref >60)
Glucose, Bld: 92 mg/dL (ref 65–99)
Potassium: 3.9 mmol/L (ref 3.5–5.1)
Sodium: 143 mmol/L (ref 135–145)

## 2015-12-03 LAB — URINALYSIS, ROUTINE W REFLEX MICROSCOPIC
Bilirubin Urine: NEGATIVE
Glucose, UA: NEGATIVE mg/dL
Hgb urine dipstick: NEGATIVE
Ketones, ur: NEGATIVE mg/dL
Leukocytes, UA: NEGATIVE
Nitrite: NEGATIVE
Protein, ur: NEGATIVE mg/dL
Specific Gravity, Urine: 1.029 (ref 1.005–1.030)
pH: 5 (ref 5.0–8.0)

## 2015-12-03 LAB — CBC
HEMATOCRIT: 38 % (ref 36.0–46.0)
HEMOGLOBIN: 11.5 g/dL — AB (ref 12.0–15.0)
MCH: 25.4 pg — ABNORMAL LOW (ref 26.0–34.0)
MCHC: 30.3 g/dL (ref 30.0–36.0)
MCV: 84.1 fL (ref 78.0–100.0)
Platelets: 397 10*3/uL (ref 150–400)
RBC: 4.52 MIL/uL (ref 3.87–5.11)
RDW: 14.6 % (ref 11.5–15.5)
WBC: 5.1 10*3/uL (ref 4.0–10.5)

## 2015-12-03 LAB — CBG MONITORING, ED: Glucose-Capillary: 78 mg/dL (ref 65–99)

## 2015-12-03 MED ORDER — SODIUM CHLORIDE 0.9 % IV BOLUS (SEPSIS)
500.0000 mL | Freq: Once | INTRAVENOUS | Status: AC
Start: 2015-12-03 — End: 2015-12-03
  Administered 2015-12-03: 500 mL via INTRAVENOUS

## 2015-12-03 NOTE — Discharge Instructions (Signed)
It was my pleasure taking care of you today!  Please follow up with a primary care provider in regards to today's visit. See the attached resource guide for help finding one.  Return to the ER for any new or worsening symptoms, any additional concerns.    Emergency Department Resource Guide 1) Find a Doctor and Pay Out of Pocket Although you won't have to find out who is covered by your insurance plan, it is a good idea to ask around and get recommendations. You will then need to call the office and see if the doctor you have chosen will accept you as a new patient and what types of options they offer for patients who are self-pay. Some doctors offer discounts or will set up payment plans for their patients who do not have insurance, but you will need to ask so you aren't surprised when you get to your appointment.  2) Contact Your Local Health Department Not all health departments have doctors that can see patients for sick visits, but many do, so it is worth a call to see if yours does. If you don't know where your local health department is, you can check in your phone book. The CDC also has a tool to help you locate your state's health department, and many state websites also have listings of all of their local health departments.  3) Find a [facility] If your illness is not likely to be very severe or complicated, you may want to try a walk in clinic. These are popping up all over the country in pharmacies, drugstores, and shopping centers. They're usually staffed by nurse practitioners or physician assistants that have been trained to treat common illnesses and complaints. They're usually fairly quick and inexpensive. However, if you have serious medical issues or chronic medical problems, these are probably not your best option.  No Primary Care Doctor: - Call Health Connect at  [phone] - they can help you locate a primary care doctor that  accepts your insurance, provides certain  services, etc. - Physician Referral Service- [phone]  Chronic Pain Problems: Organization         Address  Phone   Notes  [facility]  [phone] Patients need to be referred by their primary care doctor.   Medication Assistance: Organization         Address  Phone   Notes  [facility] Medication [facility] [address] [place], [place] [address] [phone] --Must be a resident of [facility] -- Must have NO insurance coverage whatsoever (no Medicaid/ Medicare, etc.) -- The pt. MUST have a primary care doctor that directs their care regularly and follows them in the community   MedAssist  [phone]   [employer]  [phone]    Agencies that provide inexpensive medical care: Organization         Address  Phone   Notes  [place]  [phone]   [name]    [phone]   [facility] [place], [place] [address] [phone]   [place] [address] [place] [phone]   Planned Parenthood    [phone]   [facility]    [phone]   [place] and [place] [address], [place] Phone:  [phone], Fax:  [phone] Hours of Operation:  9 am - 6  pm, M-F.  Also accepts Medicaid/Medicare and self-pay.  [facility] for [place], [address], [place] Phone: [phone], Fax: [phone]. Hours of Operation:  8:30 am - 5:30 pm, M-F.  Also accepts Medicaid and self-pay.  [facility] [place] [address], [place] Phone: [phone]   [place], [place], [place] [phone], Ext. [phone] Mondays & Thursdays: 7-9 AM.  First 15 patients are seen on a first come, first serve basis.    Grants Providers:  Organization         Address  Phone   Notes  [facility] [address], [place] [phone] Also accepts self-pay patients.  [facility] [id number] [place], [place]  [phone]   [place], [address], [place] [phone]   [facility] [address] [place] [phone]   Lucianne Lei [address], [place]   [phone] Only accepts [place] Access [place] patients after they have their name applied to their card.   Self-Pay (no insurance) in [facility]:  Organization         Address  Phone   Notes  Sickle Cell Patients, [facility] [place] [phone]   [facility] Urgent Care [place] [phone]   [name] [place]  [place], [place], [place] [phone]   [facility]/Dr. Osei-Bonsu  [address] [place] [address], [place] [phone] Phone number for both [place] and [place] locations is the same.  Urgent Medical and [facility] [address], [place] [phone]   [facility] [address], [place] or [address] [phone]   [facility] [address] [place] [phone], phone; [phone], fax Sees patients 1st and 3rd Saturday of every month.  Must not qualify for public or private insurance (i.e. Medicaid, Medicare, [place] Health Choice, Veterans' Benefits)  Household income should be no more than 200% of the poverty level The clinic cannot treat you if you are pregnant or think you are pregnant  Sexually transmitted diseases are not treated at the clinic.    Dental Care: Organization         Address  Phone  Notes  [facility] Department of [facility] [place] [phone] Accepts  children up to age 46 who are enrolled in [place] or [place]; pregnant women with a Medicaid card; and children who have applied for Medicaid or [place] Health Choice, but were declined, whose parents can pay a reduced fee at time of service.  [facility] of [facility]  [address], [place] [phone] Accepts children up to age 36 who are enrolled in [place] or [place]; pregnant women with a Medicaid card; and children who have applied for Medicaid or [place] Health Choice, but were declined, whose parents can pay a reduced fee at time of service.  [place] [facility] Adult [facility] Access PROGRAM  [place] [phone] Patients are seen by appointment only. Walk-ins are not accepted. [place] will see patients 40 years of age and older.  Monday - Tuesday (8am-5pm) Most Wednesdays (8:30-5pm) $30 per visit, cash only  [facility] Adult Dental Access PROGRAM  [address], [facility] [phone] Patients are seen by appointment only. Walk-ins are not accepted. [place] will see patients 62 years of age and older. One Wednesday Evening (Monthly: Volunteer Based).  $30 per visit, cash only  [place]  [phone] for adults; Children under age 103, call Graduate Pediatric Dentistry at [phone]. Children aged 73-14, please call [phone] to request a pediatric application.  Dental services are provided in all areas of dental care including fillings, crowns and bridges, complete and partial dentures, implants, gum treatment, root canals, and extractions. Preventive care is also provided. Treatment is provided to both adults and children. Patients are selected via a lottery and there is often a waiting list.   [facility] [address] [place]  [phone] [email or web address]   [facility] [address] [place], [place] [phone], Ext. [phone] Second and  Fourth Thursday of each month, opens at 6:30 AM; Clinic ends at 9 AM.  Patients are seen on a first-come first-served basis, and a limited number are seen during each clinic.   [facility]  [address] Hillard Danker [place], [place] [phone]   Eligibility Requirements You must have lived in [place], [place], or [place] counties for at least the last three months.   You cannot be eligible for state or federal sponsored [employer], including [employer], [place], or [employer].   You generally cannot be eligible for healthcare insurance through your employer.    How to apply: Eligibility screenings are held every Tuesday and Wednesday afternoon from 1:00 pm until 4:00 pm. You do not need an appointment for the interview!  [facility] [address], [place], [place]   [place]  [place] Department  [place]  [phone]    Behavioral Health Resources in the Community: Intensive Outpatient Programs Organization         Address  Phone  Notes  [facility] [address] [place], [place] [phone]   [facility] Outpatient [address] [place], [place]   ADS: Alcohol & Drug Svcs [address] [place], [place]   [place] [address],  [place], [place] or [phone]   Substance Abuse Resources Organization         Address  Phone  Notes  Alcohol and Drug Services  [phone]   [place]  [phone]   [place]   Chinita Pester  [phone]   Residential & Outpatient Substance Abuse Program  [phone]   Psychological Services Organization         Address  Phone  Notes  [facility] [place]  [place]  [phone]   [place]  [address], [place] or [phone]    Mobile Crisis Teams Organization         Address  Phone  Notes  [facility], Mobile Crisis Care Unit  [phone]   [facility]  [address] [place], [place]   [place] [address] [place] [phone]    Self-Help/Support Groups Organization         Address  Phone  Notes  Mental Health Assoc. of [place] - variety of support groups  [place] Call for more information  Narcotics Anonymous (NA), [facility] [address], [employer] [place]  2 meetings at this location   Special educational needs teacher         Address  Phone  Notes  ASAP Residential Treatment [place],    [place]  [phone]   [facility]  [address] [place] 762831, [place], [place]   [place], [place] [phone] Admissions: 8am-3pm M-F  Incentives Substance [place] [address]    [place], [place] [phone]   [facility] [address] [place], [place], [place]   [facility] [address],  [place], [place]   [facility] - Intensive Outpatient [address] Kristeen Mans [address], [place], [place]   [facility] [place].,  [place], [place] [phone] or [phone]   [facility] (RTS) [address] [place], [place] Accepts Medicaid  [facility] [employer] [address],  [place] [phone] Substance Abuse/Addiction Treatment   [facility] Organization         Address  Phone  Notes  [facility]  [phone]   Domenic Schwab, PhD [address] Arlis Porta [place], [place]   [phone] or [phone]   [place], [place] [phone]   [facility] [address] [place], [place] [phone] Insurance/Medicaid/sponsorship through [facility] and Families [address], [address] [place]                                    [place], [place] [phone] [place] [address], [place] [phone]    Dr. Adele Schilder  [phone]   [facility] Dept. 1) [address] [place] 2) [place] 3)  [place] 65, [place] [phone]  [phone]   [place] [phone] or [phone] (After Hours)

## 2015-12-03 NOTE — ED Notes (Signed)
Pt states while at work today she felt extremely fatigue and weak, to the point where she had to lay down, felt very light headed. Occasional bilateral flank pain.

## 2015-12-03 NOTE — ED Provider Notes (Signed)
CSN: [id number]     Arrival date & time 12/03/15  1412 History   First MD Initiated Contact with Patient 12/03/15 1737     Chief Complaint  Patient presents with  . Weakness  . Near Syncope     (Consider location/radiation/quality/duration/timing/severity/associated sxs/prior Treatment) The history is provided by the patient and medical records. No language interpreter was used.    [name] is a 50 y.o. female  with a PMH of MDD, anxiety, DM, HTN who presents to the Emergency Department complaining of persistent, intermittent fatigue and weakness which have been ongoing over the past year. 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 Pt. Denies LOC/syncopal event. Admits to intermittent episodes of lightheadedness, but not at present. Pt. States she has been seen in ER for this several times and everyone tells her nothing is wrong, but she continues to be weak and tired. No medications taken PTA. No alleviating or aggravating factors noted. Denies fever, headache, sob, cp.    Past Medical History  Diagnosis Date  . Anxiety   . Major depressive disorder, recurrent, severe with psychotic features ([place])   . Diabetes mellitus without complication ([place])   . Hypertension    History reviewed. No pertinent past surgical history. Family History  Problem Relation Age of Onset  . Hypertension Other   . Diabetes Other   . Hypertension Mother   . Diabetes Mother   . Aneurysm Mother   . Hypertension Father   . Diabetes Father   . Hypertension Sister   . Diabetes Sister    Social History  Substance Use Topics  . Smoking status: Never Smoker   . Smokeless tobacco: Never Used  . Alcohol Use: No   OB History    No data available     Review of Systems  Constitutional: Positive for fatigue. Negative for fever and chills.  HENT: Positive for congestion. Negative for sore throat.   Eyes: Negative for  visual disturbance.  Respiratory: Negative for cough, shortness of breath and wheezing.   Cardiovascular: Negative.   Gastrointestinal: Negative for nausea, vomiting and abdominal pain.  Genitourinary: Negative for dysuria.  Musculoskeletal: Negative for back pain and neck pain.  Skin: Negative for rash.  Neurological: Positive for dizziness and weakness. Negative for syncope and headaches.      Allergies  Review of patient's allergies indicates no known allergies.  Home Medications   Prior to Admission medications   Medication Sig Start Date End Date Taking? Authorizing Provider  ibuprofen (ADVIL,MOTRIN) 600 MG tablet Take 1 tablet (600 mg total) by mouth every 6 (six) hours as needed. Patient not taking: Reported on 11/17/2015 11/14/15   Charlann Lange, PA-C   BP 113/79 mmHg  Pulse 70  Temp(Src) 99 F (37.2 C) (Oral)  Resp 16  SpO2 100%  LMP 12/25/2009 Physical Exam  Constitutional: She is oriented to person, place, and time. She appears well-developed and well-nourished.  Alert and in no acute distress  HENT:  Head: Normocephalic and atraumatic.  Neck: Normal range of motion. Neck supple. No JVD present.  No carotid bruits  Cardiovascular: Normal rate, regular rhythm and normal heart sounds.  Exam reveals no gallop and no friction rub.   No murmur heard. Intact and equal pulses in all four extremities  Pulmonary/Chest: Effort normal and breath sounds normal. No respiratory distress. She has no wheezes. She has no rales. She exhibits no tenderness.  Abdominal: Soft. Bowel sounds are normal. She exhibits no distension and no mass. There is no tenderness. There is no rebound and no guarding.  Musculoskeletal: She exhibits no edema.  Moves all extremities well x 4  Neurological: She is alert and oriented to person, place, and time.  Alert, oriented, thought content appropriate, able to give a coherent history. Speech is clear and goal oriented, able to follow commands.   Cranial Nerves:  II:  Peripheral visual fields grossly normal, pupils equal, round, reactive to light III, IV, VI: EOM intact bilaterally, ptosis not present V,VII: smile symmetric, eyes kept closed tightly against resistance, facial light touch sensation equal VIII: hearing grossly normal IX, X: symmetric soft palate movement, uvula elevates symmetrically  XI: bilateral shoulder shrug symmetric and strong XII: midline tongue extension 5/5 muscle strength in upper and lower extremities bilaterally including strong and equal grip strength and dorsiflexion/plantar flexion Sensory to light touch normal in all four extremities.  Normal finger-to-nose and rapid alternating movements; Normal gait and balance  Skin: Skin is warm and dry. No rash noted. No pallor.  Slightly delayed cap refill.   Psychiatric: She has a normal mood and affect. Her behavior is normal. Judgment and thought content normal.  Nursing note and vitals reviewed.   ED Course  Procedures (including critical care time) Labs Review Labs Reviewed  BASIC METABOLIC PANEL - Abnormal; Notable for the following:    BUN 28 (*)    All other components within normal limits  CBC - Abnormal; Notable for the following:    Hemoglobin 11.5 (*)    MCH 25.4 (*)    All other components within normal limits  URINALYSIS, ROUTINE W REFLEX MICROSCOPIC (NOT AT [facility])  CBG MONITORING, ED    Imaging Review No results found. I have personally reviewed and evaluated these images and lab results as part of my medical decision-making.   EKG Interpretation None      MDM   Final diagnoses:  Chronic fatigue   [name] presents for intermittent weakness and fatigue over the last year. Denies LOC. No ha, sob, cp, fever.  CBG, CBC, BMP, and UA reviewed and reassuring. Slightly dry on exam, will give 500cc fluid bolus. Discussed with patient at length the importance of PCP follow up for evaluation of her chronic fatigue. Vitals viewed  prior to discharge. I explained the diagnosis and have given precautions as to when/if to return to the ER including for any other new or worsening symptoms. The patient understands and accepts the medical plan as it's been dictated and I have answered their questions. The patient is stable and is discharged to home in good condition.  [facility] Jamie Vaness, PA-C 12/03/15 2056  Charlesetta Shanks, MD 12/04/15 [phone]

## 2015-12-07 ENCOUNTER — Encounter (HOSPITAL_COMMUNITY): Payer: Self-pay

## 2015-12-07 ENCOUNTER — Emergency Department (HOSPITAL_COMMUNITY)
Admission: EM | Admit: 2015-12-07 | Discharge: 2015-12-08 | Disposition: A | Discharge status: Home / Self Care | Payer: Self-pay | Attending: Emergency Medicine | Admitting: Emergency Medicine

## 2015-12-07 ENCOUNTER — Emergency Department (HOSPITAL_COMMUNITY): Payer: Self-pay

## 2015-12-07 DIAGNOSIS — R51 Headache: Secondary | ICD-10-CM | POA: Insufficient documentation

## 2015-12-07 DIAGNOSIS — E119 Type 2 diabetes mellitus without complications: Secondary | ICD-10-CM | POA: Insufficient documentation

## 2015-12-07 DIAGNOSIS — R42 Dizziness and giddiness: Secondary | ICD-10-CM | POA: Insufficient documentation

## 2015-12-07 DIAGNOSIS — R519 Headache, unspecified: Secondary | ICD-10-CM

## 2015-12-07 DIAGNOSIS — Z8659 Personal history of other mental and behavioral disorders: Secondary | ICD-10-CM | POA: Insufficient documentation

## 2015-12-07 DIAGNOSIS — I10 Essential (primary) hypertension: Secondary | ICD-10-CM | POA: Insufficient documentation

## 2015-12-07 LAB — CBC WITH DIFFERENTIAL/PLATELET
BASOS ABS: 0 10*3/uL (ref 0.0–0.1)
BASOS PCT: 0 %
EOS ABS: 0.2 10*3/uL (ref 0.0–0.7)
Eosinophils Relative: 3 %
HCT: 35.2 % — ABNORMAL LOW (ref 36.0–46.0)
HEMOGLOBIN: 10.9 g/dL — AB (ref 12.0–15.0)
Lymphocytes Relative: 48 %
Lymphs Abs: 2.3 10*3/uL (ref 0.7–4.0)
MCH: 25.9 pg — ABNORMAL LOW (ref 26.0–34.0)
MCHC: 31 g/dL (ref 30.0–36.0)
MCV: 83.6 fL (ref 78.0–100.0)
MONOS PCT: 6 %
Monocytes Absolute: 0.3 10*3/uL (ref 0.1–1.0)
NEUTROS PCT: 43 %
Neutro Abs: 2.1 10*3/uL (ref 1.7–7.7)
Platelets: 338 10*3/uL (ref 150–400)
RBC: 4.21 MIL/uL (ref 3.87–5.11)
RDW: 14.7 % (ref 11.5–15.5)
WBC: 4.9 10*3/uL (ref 4.0–10.5)

## 2015-12-07 MED ORDER — SODIUM CHLORIDE 0.9 % IV BOLUS (SEPSIS)
1000.0000 mL | Freq: Once | INTRAVENOUS | Status: AC
  Administered 2015-12-08: 1000 mL via INTRAVENOUS

## 2015-12-07 NOTE — ED Provider Notes (Signed)
CSN: [id number]     Arrival date & time 12/07/15  2138 History   First MD Initiated Contact with Patient 12/07/15 2200     Chief Complaint  Patient presents with  . laid on floor     HPI   [name] is a 50 y.o. female with a PMH of anxiety, depression, DM, HTN who presents to the ED with headache and lightheadedness. She states she felt like she was going to pass out today prior to arrival. She denies exacerbating or alleviating factors. She states she has a history of headaches, and that her symptoms feel similar to prior. Per report, [facility] the floor and refused to speak anyone. No witnessed trauma. She denies fever, chills, chest pain, shortness of breath, abdominal pain, nausea, vomiting, numbness, weakness, paresthesia, anxiety, depression, hallucinations, SI/HI.   Past Medical History  Diagnosis Date  . Anxiety   . Major depressive disorder, recurrent, severe with psychotic features ([place])   . Diabetes mellitus without complication ([place])   . Hypertension    History reviewed. No pertinent past surgical history. Family History  Problem Relation Age of Onset  . Hypertension Other   . Diabetes Other   . Hypertension Mother   . Diabetes Mother   . Aneurysm Mother   . Hypertension Father   . Diabetes Father   . Hypertension Sister   . Diabetes Sister    Social History  Substance Use Topics  . Smoking status: Never Smoker   . Smokeless tobacco: Never Used  . Alcohol Use: No   OB History    No data available      Review of Systems  Constitutional: Negative for fever and chills.  Respiratory: Negative for shortness of breath.   Cardiovascular: Negative for chest pain.  Gastrointestinal: Negative for nausea, vomiting and abdominal pain.  Neurological: Positive for light-headedness and headaches. Negative for syncope, weakness and numbness.  All other systems reviewed and are negative.     Allergies  Review of patient's  allergies indicates no known allergies.  Home Medications   Prior to Admission medications   Medication Sig Start Date End Date Taking? Authorizing Provider  ibuprofen (ADVIL,MOTRIN) 600 MG tablet Take 1 tablet (600 mg total) by mouth every 6 (six) hours as needed. 12/08/15   Marella Chimes, PA-C    BP 120/81 mmHg  Pulse 76  Temp(Src) 98.1 F (36.7 C) (Oral)  Resp 20  SpO2 98%  LMP 12/25/2009 Physical Exam  Constitutional: She is oriented to person, place, and time. She appears well-developed and well-nourished. No distress.  HENT:  Head: Normocephalic and atraumatic.  Right Ear: External ear normal.  Left Ear: External ear normal.  Nose: Nose normal.  Mouth/Throat: Uvula is midline, oropharynx is clear and moist and mucous membranes are normal.  Eyes: Conjunctivae, EOM and lids are normal. Pupils are equal, round, and reactive to light. Right eye exhibits no discharge. Left eye exhibits no discharge. No scleral icterus.  Neck: Normal range of motion. Neck supple.  Cardiovascular: Normal rate, regular rhythm, normal heart sounds, intact distal pulses and normal pulses.   Pulmonary/Chest: Effort normal and breath sounds normal. No respiratory distress. She has no wheezes. She has no rales.  Abdominal: Soft. Normal appearance and bowel sounds are normal. She exhibits no distension and no mass. There is no tenderness. There is no rigidity, no rebound and no guarding.  Musculoskeletal: Normal range of motion. She exhibits no edema or tenderness.  Neurological: She is alert and oriented to person, place, and time. She has normal strength. No cranial nerve deficit or sensory deficit.  Patient appears sleepy.  Skin: Skin is warm, dry and intact. No rash noted. She is not diaphoretic. No erythema. No pallor.  Psychiatric: She has a normal mood and affect. Her speech is normal and behavior is normal.  Nursing note and vitals reviewed.   ED Course  Procedures (including critical care  time)  Labs Review Labs Reviewed  CBC WITH DIFFERENTIAL/PLATELET - Abnormal; Notable for the following:    Hemoglobin 10.9 (*)    HCT 35.2 (*)    MCH 25.9 (*)    All other components within normal limits  COMPREHENSIVE METABOLIC PANEL - Abnormal; Notable for the following:    Glucose, Bld 103 (*)    BUN 24 (*)    Total Bilirubin 0.2 (*)    All other components within normal limits  ACETAMINOPHEN LEVEL - Abnormal; Notable for the following:    Acetaminophen (Tylenol), Serum <10 (*)    All other components within normal limits  SALICYLATE LEVEL  ETHANOL  URINE RAPID DRUG SCREEN, HOSP PERFORMED    Imaging Review Ct Head Wo Contrast  12/07/2015  CLINICAL DATA:  Altered mental status EXAM: CT HEAD WITHOUT CONTRAST TECHNIQUE: Contiguous axial images were obtained from the base of the skull through the vertex without intravenous contrast. COMPARISON:  Head CT dated 08/02/2015. FINDINGS: Ventricles are normal in size and configuration. All areas of the brain demonstrate normal gray-Zeiser matter attenuation. There is no mass, hemorrhage, edema or other evidence of acute parenchymal abnormality. No extra-axial hemorrhage. Osseous structures are unremarkable. Visualized upper paranasal sinuses are clear. Mastoid air cells are clear. Superficial soft tissues are unremarkable. IMPRESSION: Normal head CT. Electronically Signed   By: Franki Cabot M.D.   On: 12/07/2015 23:08    I have personally reviewed and evaluated these images and lab results as part of my medical decision-making.   EKG Interpretation   Date/Time:  Friday December 07 2015 22:49:11 EST Ventricular Rate:  72 PR Interval:  182 QRS Duration: 82 QT Interval:  512 QTC Calculation: 560 R Axis:   43 Text Interpretation:  Normal sinus rhythm Nonspecific T wave abnormality  Abnormal ECG No significant change since last tracing Confirmed by KNOTT  MD, DANIEL [id number]) on 12/07/2015 10:57:42 PM      MDM   Final diagnoses:   Lightheadedness  Headache    50 year old female presents with headache and lightheadedness. States she has a history of similar symptoms, and that this has been a chronic problem. Per record review, patient last evaluated in the ED 2/6, she denies change in symptoms since that time. Notes she does not have a PCP and has been unable to obtain follow-up. Denies hallucinations, homicidal or suicidal ideation, drug or alcohol use.  Patient is afebrile. BP initially 0000000 systolic. No tachycardia. Heart regular rate and rhythm. Lungs clear to auscultation bilaterally. Abdomen soft, non-tender, non-distended. Patient appears somewhat sleepy, however has a normal neuro exam with no focal deficit.  EKG sinus rhythm, heart rate 72. CBC negative for leukocytosis, hemoglobin 10.9. CMP unremarkable. Salicylate, acetaminophen, ethanol negative. Head CT negative for mass, hemorrhage, edema, or acute parenchymal abnormality.  Patient gven fluids, reglan, benadryl for headache. UDS negative.  Patient reports symptom improvement after medication administration and notes she feels back to her baseline. Patient is nontoxic and well-appearing, feel she is stable for discharge at this time. BP improved to [id number] systolic.  Patient to follow-up with PCP for further evaluation and management. Sent message to case management to arrange follow-up given case management not available in the ED at this time. Return precautions discussed. Patient verbalizes her understanding and is in agreement with plan.  BP 120/81 mmHg  Pulse 76  Temp(Src) 98.1 F (36.7 C) (Oral)  Resp 20  SpO2 98%  LMP 12/25/2009   Marella Chimes, PA-C 12/08/15 0205  Leo Grosser, MD 12/08/15 [phone]

## 2015-12-07 NOTE — ED Notes (Signed)
Bed: [facility] Expected date:  Expected time:  Means of arrival:  Comments: EMS uncooperative, unkn complaint

## 2015-12-07 NOTE — ED Notes (Signed)
Per EMS pt was at [facility], laid herself on the floor, refusing to speak to anyone. VSS, CBG normal, pt refusing to open eyes or answer questions. No trauma reported by bystanders. Per EMS they have transported pt for similar complaint in the past

## 2015-12-08 LAB — COMPREHENSIVE METABOLIC PANEL
ALBUMIN: 4.1 g/dL (ref 3.5–5.0)
ALT: 14 U/L (ref 14–54)
AST: 19 U/L (ref 15–41)
Alkaline Phosphatase: 104 U/L (ref 38–126)
Anion gap: 10 (ref 5–15)
BUN: 24 mg/dL — AB (ref 6–20)
CHLORIDE: 109 mmol/L (ref 101–111)
CO2: 23 mmol/L (ref 22–32)
CREATININE: 0.93 mg/dL (ref 0.44–1.00)
Calcium: 9.1 mg/dL (ref 8.9–10.3)
GFR calc Af Amer: >60 mL/min (ref >60)
GFR calc non Af Amer: >60 mL/min (ref >60)
Glucose, Bld: 103 mg/dL — ABNORMAL HIGH (ref 65–99)
Potassium: 3.7 mmol/L (ref 3.5–5.1)
SODIUM: 142 mmol/L (ref 135–145)
Total Bilirubin: 0.2 mg/dL — ABNORMAL LOW (ref 0.3–1.2)
Total Protein: 7.2 g/dL (ref 6.5–8.1)

## 2015-12-08 LAB — SALICYLATE LEVEL: Salicylate Lvl: <4 mg/dL (ref 2.8–30.0)

## 2015-12-08 LAB — RAPID URINE DRUG SCREEN, HOSP PERFORMED
Amphetamines: NOT DETECTED
BARBITURATES: NOT DETECTED
BENZODIAZEPINES: NOT DETECTED
COCAINE: NOT DETECTED
OPIATES: NOT DETECTED
TETRAHYDROCANNABINOL: NOT DETECTED

## 2015-12-08 LAB — ACETAMINOPHEN LEVEL

## 2015-12-08 LAB — ETHANOL: Alcohol, Ethyl (B): <5 mg/dL (ref <5)

## 2015-12-08 MED ORDER — IBUPROFEN 600 MG PO TABS: 600.0000 mg | ORAL_TABLET | Freq: Four times a day (QID) | ORAL | Status: DC | PRN

## 2015-12-08 MED ORDER — DIPHENHYDRAMINE HCL 50 MG/ML IJ SOLN
12.5000 mg | Freq: Once | INTRAMUSCULAR | Status: AC
  Administered 2015-12-08: 12.5 mg via INTRAVENOUS
  Filled 2015-12-08: qty 1

## 2015-12-08 MED ORDER — METOCLOPRAMIDE HCL 5 MG/ML IJ SOLN
10.0000 mg | Freq: Once | INTRAMUSCULAR | Status: AC
  Administered 2015-12-08: 10 mg via INTRAVENOUS
  Filled 2015-12-08: qty 2

## 2015-12-08 MED ORDER — KETOROLAC TROMETHAMINE 30 MG/ML IJ SOLN: 30.0000 mg | Freq: Once | INTRAMUSCULAR | Status: DC

## 2015-12-08 NOTE — ED Notes (Signed)
Patient d/c'd self care.  F/U and medications discussed.  Patient verbalized understanding. 

## 2015-12-08 NOTE — Discharge Instructions (Signed)
1. Medications: ibuprofen or tylenol for headache, usual home medications 2. Treatment: rest, drink plenty of fluids 3. Follow Up: please followup with your primary doctor for discussion of your diagnoses and further evaluation after today's visit; if you do not have a primary care doctor use the resource guide provided to find one; please return to the ER for new or worsening symptoms    General Headache Without Cause A headache is pain or discomfort felt around the head or neck area. The specific cause of a headache may not be found. There are many causes and types of headaches. A few common ones are:  Tension headaches.  Migraine headaches.  Cluster headaches.  Chronic daily headaches. HOME CARE INSTRUCTIONS  Watch your condition for any changes. Take these steps to help with your condition: Managing Pain  Take over-the-counter and prescription medicines only as told by your health care provider.  Lie down in a dark, quiet room when you have a headache.  If directed, apply ice to the head and neck area:  Put ice in a plastic bag.  Place a towel between your skin and the bag.  Leave the ice on for 20 minutes, 2-3 times per day.  Use a heating pad or hot shower to apply heat to the head and neck area as told by your health care provider.  Keep lights dim if bright lights bother you or make your headaches worse. Eating and Drinking  Eat meals on a regular schedule.  Limit alcohol use.  Decrease the amount of caffeine you drink, or stop drinking caffeine. General Instructions  Keep all follow-up visits as told by your health care provider. This is important.  Keep a headache journal to help find out what may trigger your headaches. For example, write down:  What you eat and drink.  How much sleep you get.  Any change to your diet or medicines.  Try massage or other relaxation techniques.  Limit stress.  Sit up straight, and do not tense your muscles.  Do not  use tobacco products, including cigarettes, chewing tobacco, or e-cigarettes. If you need help quitting, ask your health care provider.  Exercise regularly as told by your health care provider.  Sleep on a regular schedule. Get 7-9 hours of sleep, or the amount recommended by your health care provider. SEEK MEDICAL CARE IF:   Your symptoms are not helped by medicine.  You have a headache that is different from the usual headache.  You have nausea or you vomit.  You have a fever. SEEK IMMEDIATE MEDICAL CARE IF:   Your headache becomes severe.  You have repeated vomiting.  You have a stiff neck.  You have a loss of vision.  You have problems with speech.  You have pain in the eye or ear.  You have muscular weakness or loss of muscle control.  You lose your balance or have trouble walking.  You feel faint or pass out.  You have confusion.   This information is not intended to replace advice given to you by your health care provider. Make sure you discuss any questions you have with your health care provider.   Document Released: 10/13/2005 Document Revised: 07/04/2015 Document Reviewed: 02/05/2015 Elsevier Interactive Patient Education 2016 [employer].   Emergency Department Resource Guide 1) Find a Doctor and Pay Out of Pocket Although you won't have to find out who is covered by your insurance plan, it is a good idea to ask around and get recommendations. You  will then need to call the office and see if the doctor you have chosen will accept you as a new patient and what types of options they offer for patients who are self-pay. Some doctors offer discounts or will set up payment plans for their patients who do not have insurance, but you will need to ask so you aren't surprised when you get to your appointment.  2) Contact Your Local Health Department Not all health departments have doctors that can see patients for sick visits, but many do, so it is worth a call to  see if yours does. If you don't know where your local health department is, you can check in your phone book. The CDC also has a tool to help you locate your state's health department, and many state websites also have listings of all of their local health departments.  3) Find a [place] [facility] If your illness is not likely to be very severe or complicated, you may want to try a walk in clinic. These are popping up all over the country in pharmacies, drugstores, and shopping centers. They're usually staffed by nurse practitioners or physician assistants that have been trained to treat common illnesses and complaints. They're usually fairly quick and inexpensive. However, if you have serious medical issues or chronic medical problems, these are probably not your best option.  No Primary Care Doctor: - Call Health Connect at  [phone] - they can help you locate a primary care doctor that  accepts your insurance, provides certain services, etc. - Physician Referral Service- [phone]  Chronic Pain Problems: Organization         Address  Phone   Notes  [facility]  [phone] Patients need to be referred by their primary care doctor.   Medication Assistance: Organization         Address  Phone   Notes  [facility] Medication [facility] [address] [place], [place] [address] [phone] --Must be a resident of [facility] -- Must have NO insurance coverage whatsoever (no Medicaid/ Medicare, etc.) -- The pt. MUST have a primary care doctor that directs their care regularly and follows them in the community   MedAssist  [phone]   [employer]  [phone]    Agencies that provide inexpensive medical care: Organization         Address  Phone   Notes  [place]  [phone]   [name]    [phone]   [facility] [place], [place] [phone]   [place] [address] [place] [phone]   Planned Parenthood    [phone]   [facility]    [phone]   [place] and [place] [address], [place] Phone:  [phone], Fax:  [phone] Hours of Operation:  9 am - 6 pm, M-F.  Also accepts Medicaid/Medicare and self-pay.  [facility] for [place] [address], [address], [place] Phone: [phone], Fax: [phone]. Hours of Operation:  8:30 am - 5:30 pm, M-F.  Also accepts Medicaid and self-pay.  [facility] [place] [address], [place] Phone: [phone]   [place], [place], [place] [phone], Ext. [phone] Mondays & Thursdays: 7-9 AM.  First 15 patients are seen on a first come, first  serve basis.    [place] Providers:  Organization         Address  Phone   Notes  [facility] [address], [place] [phone] Also accepts self-pay patients.  [facility] [id number] [place], [place]  [phone]   [place], [address], [place] [phone]   [facility] [address], [place] [phone]   Lucianne Lei [address], [place]   [phone] Only accepts [place] Access [place] patients after they have their name applied to their card.   Self-Pay (no insurance) in [facility]:  Organization         Address  Phone   Notes  Sickle Cell Patients, [facility] Internal Medicine [place] [phone]   [facility] [place] [phone]   [name] [place]  [place], [place], [place] [phone]   [facility]/Dr. Osei-Bonsu  [address] [place] or [address], [place] [phone] Phone number for both [place] and [place] locations is the same.  [facility]gent [facility] and [facility] [address] [place] [phone]   [facility] [address] [place] or [address] [phone]   [facility] [place] [address], [place] [phone], phone; [phone], fax Sees patients 1st and 3rd Saturday of every month.  Must not qualify for public or private insurance (i.e. Medicaid, Medicare, [place] Health Choice, Veterans' Benefits)  Household income should be no more than 200% of the poverty level The clinic cannot treat you if you are pregnant or think you are pregnant  Sexually transmitted diseases are not treated at the clinic.    Dental Care: Organization         Address  Phone  Notes  [facility] Department of [facility] [place] [phone] Accepts children up to age 39 who are enrolled in [place] or [place]; pregnant women with a Medicaid card; and children who have applied for Medicaid or [place] Health Choice, but were declined, whose parents can pay a reduced fee at time of service.  [facility] Department of [facility]  [address], [place] [phone] Accepts children up to age 23 who are enrolled in [place] or [place]; pregnant women with a Medicaid card; and children who have applied for Medicaid or [place] Health Choice, but were declined, whose parents can pay a reduced fee at time of service.  [place] Adult Dental Access PROGRAM  [place] [phone] Patients are seen by appointment only. Walk-ins are not accepted. [place] will see patients 25 years of age and older. Monday - Tuesday (8am-5pm) Most Wednesdays (8:30-5pm) $30 per visit, cash only  [facility] Adult Dental Access PROGRAM  [address], [facility] [phone] Patients are  seen by appointment only. Walk-ins are not accepted. [place] will see patients 60 years of age and older. One Wednesday Evening (Monthly: Volunteer Based).  $30 per visit, cash only  [place]  [phone] for adults; Children under age 27, call Graduate Pediatric Dentistry at [phone]. Children aged 13-14, please call (  [phone]) [phone] to request a pediatric application.  Dental services are provided in all areas of dental care including fillings, crowns and bridges, complete and partial dentures, implants, gum treatment, root canals, and extractions. Preventive care is also provided. Treatment is provided to both adults and children. Patients are selected via a lottery and there is often a waiting list.   [facility] [address], [place]  [phone] [email or web address]   [facility] [address] [place], [place] [phone], Ext. [phone] Second and Fourth Thursday of each month, opens at 6:30 AM; Clinic ends at 9 AM.  Patients are seen on a first-come first-served basis, and a limited number are seen during each clinic.   [facility]  [address] Hillard Danker [place], [place] [phone]   Eligibility Requirements You must have lived in [place], [place], or [place] counties for at least the last three months.   You cannot be eligible for state or federal sponsored [employer], including [employer], [place], or [employer].   You generally cannot be eligible for healthcare insurance through your employer.    How to apply: Eligibility screenings are held every Tuesday and Wednesday afternoon from 1:00 pm until 4:00 pm. You do not need an appointment for the interview!  [facility] [address], [place], [place]   [place]  [place] Department  [place]  [phone]     Behavioral Health Resources in the Community: Intensive Outpatient Programs Organization         Address  Phone  Notes  [place]. [address] [place], [place] [phone]   [facility] [address], [place], [place]   ADS: Alcohol & Drug Svcs [address] [place], [place]   [place] [address],  [place], [place] or [phone]   Substance Abuse Resources Organization         Address  Phone  Notes  Alcohol and Drug Services  [phone]   [place]  [phone]   The [place]   Chinita Pester  [phone]   Residential & Outpatient Substance Abuse Program  [phone]   Psychological Services Organization         Address  Phone  Notes  [facility] [place]  [place]  [phone]   [place] [address] [place] or [phone]    Mobile Crisis Teams Organization         Address  Phone  Notes  [facility]rapeutic Alternatives, Mobile Crisis Care Unit  [phone]   Assertive Psychotherapeutic Services  [address] [place], [place]   [place] [address] [place] [phone]    Self-Help/Support Groups Organization         Address  Phone             Notes  [place]. of [place] - variety of support groups  [place] Call for more information  Narcotics Anonymous (NA), [facility] [address], [employer] [place]  2 meetings at this location   Special educational needs teacher         Address  Phone  Notes  ASAP Residential Treatment [place],    [place]  [phone]   [place]  [id number], [place], [place]   [place], [place] [phone] Admissions: 8am-3pm M-F  Incentives  Substance [place] [address],    [place], [place] [phone]   [facility] [address] [place], [place], [place]   [facility] [address]  [place], [place]   [facility]ight Programs - Intensive Outpatient [address]een [address], [place], [place]   [facility] ([place].) [place].,  [place], [place] [phone] or [phone]   Residential Treatment Services (RTS) [address], [place], [place] Accepts Medicaid  [facility] [place] [address],  [place] [phone] Substance Abuse/Addiction Treatment   [facility] Organization         Address  Phone  Notes  [facility]  [phone]   Domenic Schwab, PhD [address] [place], [place]   [phone] or [phone]   [place], [place] [phone]   [facility] [address] [place], [place] [phone] Insurance/Medicaid/sponsorship through [facility] and Families [address], [address] [place]                                    [place], [place] [phone] [place] [address], [place] [phone]    Dr. Adele Schilder  [phone]   [facility] [place] Dept. 1) [address] [place] 2) [place] 3)  [place] [address], [place] [phone]  [phone]   [place] [phone] or [phone] (After Hours)

## 2015-12-10 NOTE — Progress Notes (Signed)
EDCM received message from EDPA to assist patient in finding a pcp.  EDCM will email [place] in attempts to obtain follow up appointment.  Patient listed as not having a pcp or insurance.  No further EDCM needs at this time.

## 2015-12-11 ENCOUNTER — Telehealth: Payer: Self-pay

## 2015-12-11 NOTE — Telephone Encounter (Signed)
Message  received from Livia Snellen, RN CM requesting an appointment for the patient at the [facility]. Call placed to [phone] (M) and the person who answered stated that the patient does not live there. Call then placed to #  [phone] (H) and a HIPAA compliant voice mail message was left requesting a call back to # [phone] or [phone].  Update provided to A. Christen Bame, RN CM.

## 2016-01-10 ENCOUNTER — Encounter (HOSPITAL_COMMUNITY): Payer: Self-pay

## 2016-01-10 ENCOUNTER — Emergency Department (HOSPITAL_COMMUNITY)
Admission: EM | Admit: 2016-01-10 | Discharge: 2016-01-11 | Disposition: A | Discharge status: Home / Self Care | Payer: Self-pay | Attending: Emergency Medicine | Admitting: Emergency Medicine

## 2016-01-10 DIAGNOSIS — I10 Essential (primary) hypertension: Secondary | ICD-10-CM | POA: Insufficient documentation

## 2016-01-10 DIAGNOSIS — E119 Type 2 diabetes mellitus without complications: Secondary | ICD-10-CM | POA: Insufficient documentation

## 2016-01-10 DIAGNOSIS — E860 Dehydration: Secondary | ICD-10-CM | POA: Insufficient documentation

## 2016-01-10 DIAGNOSIS — Z8659 Personal history of other mental and behavioral disorders: Secondary | ICD-10-CM | POA: Insufficient documentation

## 2016-01-10 DIAGNOSIS — N390 Urinary tract infection, site not specified: Secondary | ICD-10-CM | POA: Insufficient documentation

## 2016-01-10 DIAGNOSIS — E876 Hypokalemia: Secondary | ICD-10-CM | POA: Insufficient documentation

## 2016-01-10 LAB — COMPREHENSIVE METABOLIC PANEL
ALBUMIN: 4.4 g/dL (ref 3.5–5.0)
ALK PHOS: 78 U/L (ref 38–126)
ALT: 22 U/L (ref 14–54)
ANION GAP: 14 (ref 5–15)
AST: 23 U/L (ref 15–41)
BILIRUBIN TOTAL: 0.9 mg/dL (ref 0.3–1.2)
BUN: 11 mg/dL (ref 6–20)
CALCIUM: 9.8 mg/dL (ref 8.9–10.3)
CO2: 22 mmol/L (ref 22–32)
Chloride: 99 mmol/L — ABNORMAL LOW (ref 101–111)
Creatinine, Ser: 0.89 mg/dL (ref 0.44–1.00)
GFR calc Af Amer: >60 mL/min (ref >60)
GFR calc non Af Amer: >60 mL/min (ref >60)
GLUCOSE: 162 mg/dL — AB (ref 65–99)
Potassium: 2.7 mmol/L — CL (ref 3.5–5.1)
Sodium: 135 mmol/L (ref 135–145)
TOTAL PROTEIN: 7.8 g/dL (ref 6.5–8.1)

## 2016-01-10 LAB — CBG MONITORING, ED
GLUCOSE-CAPILLARY: 145 mg/dL — AB (ref 65–99)
Glucose-Capillary: 217 mg/dL — ABNORMAL HIGH (ref 65–99)

## 2016-01-10 LAB — CBC
HCT: 43.5 % (ref 36.0–46.0)
Hemoglobin: 14.6 g/dL (ref 12.0–15.0)
MCH: 26.1 pg (ref 26.0–34.0)
MCHC: 33.6 g/dL (ref 30.0–36.0)
MCV: 77.7 fL — ABNORMAL LOW (ref 78.0–100.0)
PLATELETS: 203 10*3/uL (ref 150–400)
RBC: 5.6 MIL/uL — ABNORMAL HIGH (ref 3.87–5.11)
RDW: 13.9 % (ref 11.5–15.5)
WBC: 6.2 10*3/uL (ref 4.0–10.5)

## 2016-01-10 LAB — URINALYSIS, ROUTINE W REFLEX MICROSCOPIC
Glucose, UA: NEGATIVE mg/dL
Hgb urine dipstick: NEGATIVE
KETONES UR: 40 mg/dL — AB
NITRITE: NEGATIVE
PROTEIN: 30 mg/dL — AB
Specific Gravity, Urine: 1.022 (ref 1.005–1.030)
pH: 6 (ref 5.0–8.0)

## 2016-01-10 LAB — LIPASE, BLOOD: Lipase: 43 U/L (ref 11–51)

## 2016-01-10 LAB — URINE MICROSCOPIC-ADD ON: RBC / HPF: NONE SEEN RBC/hpf (ref 0–5)

## 2016-01-10 MED ORDER — ONDANSETRON 4 MG PO TBDP
4.0000 mg | ORAL_TABLET | Freq: Once | ORAL | Status: AC | PRN
  Administered 2016-01-10: 4 mg via ORAL
  Filled 2016-01-10: qty 1

## 2016-01-10 NOTE — ED Notes (Signed)
Pt c/o emesis x "over a month."  Denies pain.  Pt was recently released after 30 days in jail.  Sts "I did not eat or drink the entire time.  I am badly dehydrated."  Pt reports that she ate [employer] prior to arrival and "has been able to keep it down."

## 2016-01-10 NOTE — ED Notes (Signed)
I ATTEMPTED TO COLLECT LABS AND WAS UNSUCCESSFUL.  PATIENT STATED SHE HAS NOT EATEN OR DRANK ANYTHING IN 30 DAYS.

## 2016-01-10 NOTE — ED Notes (Signed)
"  I just had [employer]"

## 2016-01-11 MED ORDER — PROMETHAZINE HCL 12.5 MG PO TABS: 25.0000 mg | ORAL_TABLET | Freq: Four times a day (QID) | ORAL | Status: DC | PRN

## 2016-01-11 MED ORDER — ONDANSETRON HCL 4 MG/2ML IJ SOLN
4.0000 mg | Freq: Once | INTRAMUSCULAR | Status: AC
  Administered 2016-01-11: 4 mg via INTRAVENOUS
  Filled 2016-01-11: qty 2

## 2016-01-11 MED ORDER — SODIUM CHLORIDE 0.9 % IV BOLUS (SEPSIS)
1000.0000 mL | Freq: Once | INTRAVENOUS | Status: AC
  Administered 2016-01-11: 1000 mL via INTRAVENOUS

## 2016-01-11 MED ORDER — POTASSIUM CHLORIDE 10 MEQ/100ML IV SOLN
10.0000 meq | Freq: Once | INTRAVENOUS | Status: AC
  Administered 2016-01-11: 10 meq via INTRAVENOUS
  Filled 2016-01-11: qty 100

## 2016-01-11 MED ORDER — POTASSIUM CHLORIDE CRYS ER 20 MEQ PO TBCR
40.0000 meq | EXTENDED_RELEASE_TABLET | Freq: Once | ORAL | Status: AC
  Administered 2016-01-11: 40 meq via ORAL
  Filled 2016-01-11: qty 2

## 2016-01-11 MED ORDER — CEPHALEXIN 500 MG PO CAPS: 500.0000 mg | ORAL_CAPSULE | Freq: Two times a day (BID) | ORAL | Status: DC

## 2016-01-11 MED ORDER — POTASSIUM CHLORIDE ER 20 MEQ PO TBCR: 10.0000 meq | EXTENDED_RELEASE_TABLET | Freq: Two times a day (BID) | ORAL | Status: DC

## 2016-01-11 NOTE — ED Provider Notes (Signed)
CSN: [id number]     Arrival date & time 01/10/16  1847 History   First MD Initiated Contact with Patient 01/11/16 0116     Chief Complaint  Patient presents with  . Emesis     (Consider location/radiation/quality/duration/timing/severity/associated sxs/prior Treatment) HPI [name] is a 50 y.o. female with history of depression, anxiety, diabetes, hypertension, presents to emergency department complaining of dehydration. Patient states she was in jail for the last 30 days. She states that she has not been able to eat or drink anything at the jail because "it smelled so bad." She denies any abdominal pain. She denies any diarrhea. She states she got out of jail today and ate [employer] which she states she threw up. She states she feels dizzy when she stands up. She states she feels weak and dehydrated. She states that she last ate February 16. She states she also has not had any water to drink in the last 30 days. She denies any fever or chills. She denies any abdominal pain. She denies any back pain. She states she has had dysuria for "some time." No vaginal discharge or possibility of STI according to the patient. Deferred pelvic exam.  Past Medical History  Diagnosis Date  . Anxiety   . Major depressive disorder, recurrent, severe with psychotic features ([place])   . Diabetes mellitus without complication ([place])   . Hypertension    History reviewed. No pertinent past surgical history. Family History  Problem Relation Age of Onset  . Hypertension Other   . Diabetes Other   . Hypertension Mother   . Diabetes Mother   . Aneurysm Mother   . Hypertension Father   . Diabetes Father   . Hypertension Sister   . Diabetes Sister    Social History  Substance Use Topics  . Smoking status: Never Smoker   . Smokeless tobacco: Never Used  . Alcohol Use: No   OB History    No data available     Review of Systems  Constitutional: Positive for fatigue. Negative for fever and chills.   Respiratory: Negative for cough, chest tightness and shortness of breath.   Cardiovascular: Negative for chest pain, palpitations and leg swelling.  Gastrointestinal: Positive for nausea and vomiting. Negative for abdominal pain and diarrhea.  Genitourinary: Positive for dysuria. Negative for flank pain, vaginal bleeding, vaginal discharge, vaginal pain and pelvic pain.  Musculoskeletal: Negative for myalgias, arthralgias, neck pain and neck stiffness.  Skin: Negative for rash.  Neurological: Positive for dizziness, weakness and light-headedness. Negative for headaches.  All other systems reviewed and are negative.     Allergies  Review of patient's allergies indicates no known allergies.  Home Medications   Prior to Admission medications   Medication Sig Start Date End Date Taking? Authorizing Provider  ibuprofen (ADVIL,MOTRIN) 600 MG tablet Take 1 tablet (600 mg total) by mouth every 6 (six) hours as needed. Patient not taking: Reported on 01/10/2016 12/08/15   Marella Chimes, PA-C   BP 145/71 mmHg  Pulse 92  Temp(Src) 98 F (36.7 C) (Oral)  Resp 18  SpO2 100%  LMP 12/25/2009 Physical Exam  Constitutional: She is oriented to person, place, and time. She appears well-developed and well-nourished. No distress.  HENT:  Head: Normocephalic.  Oral mucosa dry  Eyes: Conjunctivae are normal.  Neck: Neck supple.  Cardiovascular: Normal rate, regular rhythm and normal heart sounds.   Pulmonary/Chest: Effort normal and breath sounds normal. No respiratory distress. She has no wheezes. She has no  rales.  Abdominal: Soft. Bowel sounds are normal. She exhibits no distension. There is no tenderness. There is no rebound.  Musculoskeletal: She exhibits no edema.  Neurological: She is alert and oriented to person, place, and time.  Skin: Skin is warm and dry.  Psychiatric: She has a normal mood and affect. Her behavior is normal.  Nursing note and vitals reviewed.   ED Course   Procedures (including critical care time) Labs Review Labs Reviewed  COMPREHENSIVE METABOLIC PANEL - Abnormal; Notable for the following:    Potassium 2.7 (*)    Chloride 99 (*)    Glucose, Bld 162 (*)    All other components within normal limits  CBC - Abnormal; Notable for the following:    RBC 5.60 (*)    MCV 77.7 (*)    All other components within normal limits  URINALYSIS, ROUTINE W REFLEX MICROSCOPIC (NOT AT [facility]) - Abnormal; Notable for the following:    APPearance CLOUDY (*)    Bilirubin Urine MODERATE (*)    Ketones, ur 40 (*)    Protein, ur 30 (*)    Leukocytes, UA TRACE (*)    All other components within normal limits  URINE MICROSCOPIC-ADD ON - Abnormal; Notable for the following:    Squamous Epithelial / LPF 6-30 (*)    Bacteria, UA MANY (*)    All other components within normal limits  CBG MONITORING, ED - Abnormal; Notable for the following:    Glucose-Capillary 217 (*)    All other components within normal limits  CBG MONITORING, ED - Abnormal; Notable for the following:    Glucose-Capillary 145 (*)    All other components within normal limits  LIPASE, BLOOD    Imaging Review No results found. I have personally reviewed and evaluated these images and lab results as part of my medical decision-making.   EKG Interpretation None      MDM   Final diagnoses:  Dehydration  UTI (lower urinary tract infection)  Hypokalemia   Patient presents with dehydration, states she hasn't been able to eat or drink giving in 30 days. She however denies any abdominal pain. Abdomen is soft and exam, no tenderness. Vital signs are initially tachycardic, recheck normal. Will check labs, UA, will hydrate.  5:28 AM Pt hydrated with 2L of NS. She received 76mEq of potassium IV and 46mEq PO. She is tolerating PO fluids. WIll dc home with tx for UTI. Keflex. Will give phenergan for nausea. Potassium.   Filed Vitals:   01/10/16 1903 01/10/16 2353 01/11/16 0256 01/11/16 0501   BP:  145/71 103/66 103/66  Pulse: 111 92 77 77  Temp: 98.1 F (36.7 C) 98 F (36.7 C) 98.4 F (36.9 C) 98.6 F (37 C)  TempSrc: Oral Oral Oral Oral  Resp: 16 18 18 18   SpO2: 98% 100% 100% 100%     Jeannett Senior, PA-C 01/11/16 [id number]  Varney Biles, MD 01/12/16 0107

## 2016-01-11 NOTE — Discharge Instructions (Signed)
Potassium as prescribed for low potassium level. Keflex as prescribed until gone for urinary tract infection. Take Phenergan for nausea as prescribed as needed. Follow with primary care doctor. Make sure to continue to stay hydrated.   Dehydration, Adult Dehydration is a condition in which you do not have enough fluid or water in your body. It happens when you take in less fluid than you lose. Vital organs such as the kidneys, brain, and heart cannot function without a proper amount of fluids. Any loss of fluids from the body can cause dehydration.  Dehydration can range from mild to severe. This condition should be treated right away to help prevent it from becoming severe. CAUSES  This condition may be caused by:  Vomiting.  Diarrhea.  Excessive sweating, such as when exercising in hot or humid weather.  Not drinking enough fluid during strenuous exercise or during an illness.  Excessive urine output.  Fever.  Certain medicines. RISK FACTORS This condition is more likely to develop in:  People who are taking certain medicines that cause the body to lose excess fluid (diuretics).   People who have a chronic illness, such as diabetes, that may increase urination.  Older adults.   People who live at high altitudes.   People who participate in endurance sports.  SYMPTOMS  Mild Dehydration  Thirst.  Dry lips.  Slightly dry mouth.  Dry, warm skin. Moderate Dehydration  Very dry mouth.   Muscle cramps.   Dark urine and decreased urine production.   Decreased tear production.   Headache.   Light-headedness, especially when you stand up from a sitting position.  Severe Dehydration  Changes in skin.   Cold and clammy skin.   Skin does not spring back quickly when lightly pinched and released.   Changes in body fluids.   Extreme thirst.   No tears.   Not able to sweat when body temperature is high, such as in hot weather.   Minimal  urine production.   Changes in vital signs.   Rapid, weak pulse (more than 100 beats per minute when you are sitting still).   Rapid breathing.   Low blood pressure.   Other changes.   Sunken eyes.   Cold hands and feet.   Confusion.  Lethargy and difficulty being awakened.  Fainting (syncope).   Short-term weight loss.   Unconsciousness. DIAGNOSIS  This condition may be diagnosed based on your symptoms. You may also have tests to determine how severe your dehydration is. These tests may include:   Urine tests.   Blood tests.  TREATMENT  Treatment for this condition depends on the severity. Mild or moderate dehydration can often be treated at home. Treatment should be started right away. Do not wait until dehydration becomes severe. Severe dehydration needs to be treated at the hospital. Treatment for Mild Dehydration  Drinking plenty of water to replace the fluid you have lost.   Replacing minerals in your blood (electrolytes) that you may have lost.  Treatment for Moderate Dehydration  Consuming oral rehydration solution (ORS). Treatment for Severe Dehydration  Receiving fluid through an IV tube.   Receiving electrolyte solution through a feeding tube that is passed through your nose and into your stomach (nasogastric tube or NG tube).  Correcting any abnormalities in electrolytes. HOME CARE INSTRUCTIONS   Drink enough fluid to keep your urine clear or pale yellow.   Drink water or fluid slowly by taking small sips. You can also try sucking on ice cubes.  Have food or beverages that contain electrolytes. Examples include bananas and sports drinks.  Take over-the-counter and prescription medicines only as told by your health care provider.   Prepare ORS according to the manufacturer's instructions. Take sips of ORS every 5 minutes until your urine returns to normal.  If you have vomiting or diarrhea, continue to try to drink water, ORS,  or both.   If you have diarrhea, avoid:   Beverages that contain caffeine.   Fruit juice.   Milk.   Carbonated soft drinks.  Do not take salt tablets. This can lead to the condition of having too much sodium in your body (hypernatremia).  SEEK MEDICAL CARE IF:  You cannot eat or drink without vomiting.  You have had moderate diarrhea during a period of more than 24 hours.  You have a fever. SEEK IMMEDIATE MEDICAL CARE IF:   You have extreme thirst.  You have severe diarrhea.  You have not urinated in 6-8 hours, or you have urinated only a small amount of very dark urine.  You have shriveled skin.  You are dizzy, confused, or both.   This information is not intended to replace advice given to you by your health care provider. Make sure you discuss any questions you have with your health care provider.   Document Released: 10/13/2005 Document Revised: 07/04/2015 Document Reviewed: 02/28/2015 Elsevier Interactive Patient Education [employer].

## 2016-01-13 ENCOUNTER — Encounter (HOSPITAL_COMMUNITY): Payer: Self-pay | Admitting: Emergency Medicine

## 2016-01-13 ENCOUNTER — Emergency Department (HOSPITAL_COMMUNITY)
Admission: EM | Admit: 2016-01-13 | Discharge: 2016-01-14 | Disposition: A | Discharge status: Home / Self Care | Payer: Self-pay | Attending: Emergency Medicine | Admitting: Emergency Medicine

## 2016-01-13 DIAGNOSIS — R202 Paresthesia of skin: Secondary | ICD-10-CM | POA: Insufficient documentation

## 2016-01-13 DIAGNOSIS — F22 Delusional disorders: Secondary | ICD-10-CM | POA: Insufficient documentation

## 2016-01-13 DIAGNOSIS — Z792 Long term (current) use of antibiotics: Secondary | ICD-10-CM | POA: Insufficient documentation

## 2016-01-13 DIAGNOSIS — Z87440 Personal history of urinary (tract) infections: Secondary | ICD-10-CM | POA: Insufficient documentation

## 2016-01-13 DIAGNOSIS — R251 Tremor, unspecified: Secondary | ICD-10-CM | POA: Insufficient documentation

## 2016-01-13 DIAGNOSIS — R29810 Facial weakness: Secondary | ICD-10-CM | POA: Insufficient documentation

## 2016-01-13 DIAGNOSIS — I10 Essential (primary) hypertension: Secondary | ICD-10-CM | POA: Insufficient documentation

## 2016-01-13 DIAGNOSIS — R110 Nausea: Secondary | ICD-10-CM | POA: Insufficient documentation

## 2016-01-13 DIAGNOSIS — E119 Type 2 diabetes mellitus without complications: Secondary | ICD-10-CM | POA: Insufficient documentation

## 2016-01-13 LAB — CBC WITH DIFFERENTIAL/PLATELET
BASOS ABS: 0 10*3/uL (ref 0.0–0.1)
Basophils Relative: 0 %
EOS PCT: 2 %
Eosinophils Absolute: 0.2 10*3/uL (ref 0.0–0.7)
HEMATOCRIT: 34 % — AB (ref 36.0–46.0)
Hemoglobin: 10.9 g/dL — ABNORMAL LOW (ref 12.0–15.0)
LYMPHS ABS: 2.5 10*3/uL (ref 0.7–4.0)
LYMPHS PCT: 37 %
MCH: 26 pg (ref 26.0–34.0)
MCHC: 32.1 g/dL (ref 30.0–36.0)
MCV: 81 fL (ref 78.0–100.0)
MONO ABS: 0.4 10*3/uL (ref 0.1–1.0)
MONOS PCT: 5 %
NEUTROS ABS: 3.7 10*3/uL (ref 1.7–7.7)
Neutrophils Relative %: 56 %
PLATELETS: 179 10*3/uL (ref 150–400)
RBC: 4.2 MIL/uL (ref 3.87–5.11)
RDW: 14.8 % (ref 11.5–15.5)
WBC: 6.7 10*3/uL (ref 4.0–10.5)

## 2016-01-13 MED ORDER — SODIUM CHLORIDE 0.9 % IV BOLUS (SEPSIS)
1000.0000 mL | Freq: Once | INTRAVENOUS | Status: AC
  Administered 2016-01-13: 1000 mL via INTRAVENOUS

## 2016-01-13 NOTE — ED Notes (Signed)
Pt was able to un dress her self with out any assistance nurse made aware

## 2016-01-13 NOTE — ED Notes (Signed)
Patient reports her speech is slurred and that she is having a stroke. States she had facial drooping last night. Patient states the symptoms started last night but didn't come in. Patient is speaking in complete sentences, no slurring noted. Patient also states she is "shaking involuntarily" and can't stop. Patient states she also feels like she is going to throw up "sometimes". Patient was questioned what makes her believe she is having a stoke and she says "because I am, it's passing through me".

## 2016-01-13 NOTE — ED Notes (Signed)
Per [facility], pt seen here on Friday for UTI, didn't fill abx, c/o anxiety x 1 day, stroke scale negative.  Pt stated her K+ was low when she was here also.

## 2016-01-13 NOTE — ED Provider Notes (Signed)
CSN: [id number]     Arrival date & time 01/13/16  2036 History  By signing my name below, I, Rowan Blase, attest that this documentation has been prepared under the direction and in the presence of Veryl Speak, MD . Electronically Signed: Rowan Blase, Scribe. 01/13/2016. 11:16 PM.   Chief Complaint  Patient presents with  . multiple complaints    The history is provided by the patient. No language interpreter was used.   HPI Comments:  [name] is a 50 y.o. female with PMHx of DM, HTN and major depressive disorder who presents to the Emergency Department stating she feels like she is having a stroke or a seizure. Pt reports associated facial drooping last night, tremors in face and arms, and occasional nausea. She also notes her menstrual cycle stopped 6 years ago. No alleviating factors noted. She states she has experienced extreme stress and trauma which she has discussed with the police and [employer]. Pt states she has filed harassment cases against 2 people at [facility]. Pt was seen in ED two days ago for UTI; she is a frequent visitor in the ER for similar symptoms. She currently lives at a ministry center; she states her family is in [place]. Pt denies current PCP.  Past Medical History  Diagnosis Date  . Anxiety   . Major depressive disorder, recurrent, severe with psychotic features ([place])   . Diabetes mellitus without complication ([place])   . Hypertension    History reviewed. No pertinent past surgical history. Family History  Problem Relation Age of Onset  . Hypertension Other   . Diabetes Other   . Hypertension Mother   . Diabetes Mother   . Aneurysm Mother   . Hypertension Father   . Diabetes Father   . Hypertension Sister   . Diabetes Sister    Social History  Substance Use Topics  . Smoking status: Never Smoker   . Smokeless tobacco: Never Used  . Alcohol Use: No   OB History    No data available     Review of Systems  Gastrointestinal: Positive for  nausea.  Neurological: Positive for tremors and facial asymmetry.  All other systems reviewed and are negative.  Allergies  Review of patient's allergies indicates no known allergies.  Home Medications   Prior to Admission medications   Medication Sig Start Date End Date Taking? Authorizing Provider  cephALEXin (KEFLEX) 500 MG capsule Take 1 capsule (500 mg total) by mouth 2 (two) times daily. 01/11/16   Tatyana Kirichenko, PA-C  ibuprofen (ADVIL,MOTRIN) 600 MG tablet Take 1 tablet (600 mg total) by mouth every 6 (six) hours as needed. Patient not taking: Reported on 01/10/2016 12/08/15   Marella Chimes, PA-C  potassium chloride 20 MEQ TBCR Take 10 mEq by mouth 2 (two) times daily. Patient not taking: Reported on 01/13/2016 01/11/16   Lahoma Rocker Kirichenko, PA-C  promethazine (PHENERGAN) 12.5 MG tablet Take 2 tablets (25 mg total) by mouth every 6 (six) hours as needed for nausea or vomiting. Patient not taking: Reported on 01/13/2016 01/11/16   Tatyana Kirichenko, PA-C   BP 104/69 mmHg  Pulse 73  Temp(Src) 98.8 F (37.1 C) (Oral)  Resp 18  SpO2 100%  LMP 12/25/2009 Physical Exam  Constitutional: She is oriented to person, place, and time. She appears well-developed and well-nourished. No distress.  HENT:  Head: Normocephalic and atraumatic.  Mouth/Throat: Oropharynx is clear and moist. No oropharyngeal exudate.  Eyes: Conjunctivae and EOM are normal. Pupils are equal, round, and  reactive to light. Right eye exhibits no discharge. Left eye exhibits no discharge. No scleral icterus.  Neck: Normal range of motion. Neck supple. No JVD present. No thyromegaly present.  Cardiovascular: Normal rate, regular rhythm, normal heart sounds and intact distal pulses.  Exam reveals no gallop and no friction rub.   No murmur heard. Pulmonary/Chest: Effort normal and breath sounds normal. No respiratory distress. She has no wheezes. She has no rales.  Abdominal: Soft. Bowel sounds are normal. She  exhibits no distension and no mass. There is no tenderness.  Musculoskeletal: Normal range of motion. She exhibits no edema or tenderness.  Lymphadenopathy:    She has no cervical adenopathy.  Neurological: She is alert and oriented to person, place, and time. No cranial nerve deficit. She exhibits normal muscle tone. Coordination normal.  Skin: Skin is warm and dry. No rash noted. No erythema.  Psychiatric: Her speech is normal and behavior is normal. Her affect is labile. Thought content is paranoid. Cognition and memory are normal. She expresses impulsivity.  Nursing note and vitals reviewed.  ED Course  Procedures  DIAGNOSTIC STUDIES:  Oxygen Saturation is 100% on RA, normal by my interpretation.    COORDINATION OF CARE:  11:12 PM Informed pt there is no concern for stroke. Will order blood work. Discussed treatment plan with pt at bedside and pt agreed to plan.  Labs Review Labs Reviewed  BASIC METABOLIC PANEL - Abnormal; Notable for the following:    Potassium 3.3 (*)    Glucose, Bld 157 (*)    All other components within normal limits  CBC WITH DIFFERENTIAL/PLATELET - Abnormal; Notable for the following:    Hemoglobin 10.9 (*)    HCT 34.0 (*)    All other components within normal limits    Imaging Review No results found. I have personally reviewed and evaluated these images and lab results as part of my medical decision-making.    MDM   Final diagnoses:  None   Patient is a 50 year old female with history of diabetes, hypertension, anxiety, and major depressive order with psychotic features. She presents for evaluation of facial droop and tremor and concerns of a stroke. She has been worked up in the past for this on several occasions, however nothing has been found. Her electrolytes today are unremarkable. She was recently seen for a low potassium. Today it is 3.3 and I highly doubt has anything to do with her symptoms. I highly doubt stroke or other emergent  medical issue. She will be discharged with instructions to follow-up with her primary doctor.  I personally performed the services described in this documentation, which was scribed in my presence. The recorded information has been reviewed and is accurate.       Veryl Speak, MD 01/14/16 [phone]

## 2016-01-14 LAB — BASIC METABOLIC PANEL
ANION GAP: 9 (ref 5–15)
BUN: 10 mg/dL (ref 6–20)
CALCIUM: 9 mg/dL (ref 8.9–10.3)
CO2: 25 mmol/L (ref 22–32)
Chloride: 105 mmol/L (ref 101–111)
Creatinine, Ser: 0.8 mg/dL (ref 0.44–1.00)
GLUCOSE: 157 mg/dL — AB (ref 65–99)
POTASSIUM: 3.3 mmol/L — AB (ref 3.5–5.1)
SODIUM: 139 mmol/L (ref 135–145)

## 2016-01-14 NOTE — Discharge Instructions (Signed)
Paresthesia Paresthesia is an abnormal burning or prickling sensation. This sensation is generally felt in the hands, arms, legs, or feet. However, it may occur in any part of the body. Usually, it is not painful. The feeling may be described as:  Tingling or numbness.  Pins and needles.  Skin crawling.  Buzzing.  Limbs falling asleep.  Itching. Most people experience temporary (transient) paresthesia at some time in their lives. Paresthesia may occur when you breathe too quickly (hyperventilation). It can also occur without any apparent cause. Commonly, paresthesia occurs when pressure is placed on a nerve. The sensation quickly goes away after the pressure is removed. For some people, however, paresthesia is a long-lasting (chronic) condition that is caused by an underlying disorder. If you continue to have paresthesia, you may need further medical evaluation. HOME CARE INSTRUCTIONS Watch your condition for any changes. Taking the following actions may help to lessen any discomfort that you are feeling:  Avoid drinking alcohol.  Try acupuncture or massage to help relieve your symptoms.  Keep all follow-up visits as directed by your health care provider. This is important. SEEK MEDICAL CARE IF:  You continue to have episodes of paresthesia.  Your burning or prickling feeling gets worse when you walk.  You have pain, cramps, or dizziness.  You develop a rash. SEEK IMMEDIATE MEDICAL CARE IF:  You feel weak.  You have trouble walking or moving.  You have problems with speech, understanding, or vision.  You feel confused.  You cannot control your bladder or bowel movements.  You have numbness after an injury.  You faint.   This information is not intended to replace advice given to you by your health care provider. Make sure you discuss any questions you have with your health care provider.   Document Released: 10/03/2002 Document Revised: 02/27/2015 Document Reviewed:  10/09/2014 Elsevier Interactive Patient Education 2016 Elsevier Inc.  

## 2016-01-22 DIAGNOSIS — Z5900 Homelessness unspecified: Secondary | ICD-10-CM

## 2016-01-22 MED FILL — CEPHALEXIN 500 MG CAPSULE: 500 | 7 days supply | Qty: 14 | Fill #0

## 2016-01-22 MED FILL — KLOR-CON M20 TABLET: 20 | 10 days supply | Qty: 10 | Fill #0

## 2016-01-24 NOTE — Congregational Nurse Program (Signed)
Congregational Nurse Program Note  Date of Encounter: 01/22/2016  Past Medical History: Past Medical History  Diagnosis Date  . Anxiety   . Major depressive disorder, recurrent, severe with psychotic features ([place])   . Diabetes mellitus without complication ([place])   . Hypertension     Encounter Details:     CNP Questionnaire - 01/22/16 0847    Patient Demographics   Is this a new or existing patient? Existing   Patient is considered a/an Not Applicable   Patient Assistance   Location of Patient Assistance Not Applicable   Patient's financial/insurance status Low Income;Self-Pay   Uninsured Patient Yes   Interventions Counseled to make appt. with provider   Patient referred to apply for the following financial assistance [employer] insecurities addressed Provided food supplies   Transportation assistance No   Assistance securing medications Yes   Type of Assistance [facility]   Educational health offerings Acute disease;Medications   Encounter Details   Primary purpose of visit Education/Health Concerns   Was an Emergency Department visit averted? Not Applicable   Does patient have a medical provider? Yes   Patient referred to Clinic   Was a mental health screening completed? (GAINS tool) No   Does patient have dental issues? No   Does patient have vision issues? No   Since previous encounter, have you referred patient for abnormal blood pressure that resulted in a new diagnosis or medication change? No   Since previous encounter, have you referred patient for abnormal blood glucose that resulted in a new diagnosis or medication change? No   For [employer] Use Only   Does patient have insurance? No       Was seen in the ED for UTI and scripts were given.  Needed assistance with obtaining medications.  Antibiotic and K+ obtained from [place].

## 2016-02-17 ENCOUNTER — Emergency Department (HOSPITAL_COMMUNITY)
Admission: EM | Admit: 2016-02-17 | Discharge: 2016-02-17 | Disposition: A | Discharge status: Home / Self Care | Payer: Self-pay | Attending: Emergency Medicine | Admitting: Emergency Medicine

## 2016-02-17 ENCOUNTER — Encounter (HOSPITAL_COMMUNITY): Payer: Self-pay

## 2016-02-17 DIAGNOSIS — I10 Essential (primary) hypertension: Secondary | ICD-10-CM | POA: Insufficient documentation

## 2016-02-17 DIAGNOSIS — F333 Major depressive disorder, recurrent, severe with psychotic symptoms: Secondary | ICD-10-CM | POA: Insufficient documentation

## 2016-02-17 DIAGNOSIS — R32 Unspecified urinary incontinence: Secondary | ICD-10-CM | POA: Insufficient documentation

## 2016-02-17 DIAGNOSIS — R55 Syncope and collapse: Secondary | ICD-10-CM | POA: Insufficient documentation

## 2016-02-17 DIAGNOSIS — Z791 Long term (current) use of non-steroidal anti-inflammatories (NSAID): Secondary | ICD-10-CM | POA: Insufficient documentation

## 2016-02-17 DIAGNOSIS — E119 Type 2 diabetes mellitus without complications: Secondary | ICD-10-CM | POA: Insufficient documentation

## 2016-02-17 DIAGNOSIS — Z792 Long term (current) use of antibiotics: Secondary | ICD-10-CM | POA: Insufficient documentation

## 2016-02-17 LAB — CBC
HCT: 35.5 % — ABNORMAL LOW (ref 36.0–46.0)
Hemoglobin: 11.2 g/dL — ABNORMAL LOW (ref 12.0–15.0)
MCH: 25.9 pg — AB (ref 26.0–34.0)
MCHC: 31.5 g/dL (ref 30.0–36.0)
MCV: 82.2 fL (ref 78.0–100.0)
PLATELETS: 279 10*3/uL (ref 150–400)
RBC: 4.32 MIL/uL (ref 3.87–5.11)
RDW: 15.7 % — AB (ref 11.5–15.5)
WBC: 5.5 10*3/uL (ref 4.0–10.5)

## 2016-02-17 LAB — CBG MONITORING, ED: GLUCOSE-CAPILLARY: 102 mg/dL — AB (ref 65–99)

## 2016-02-17 LAB — URINALYSIS, ROUTINE W REFLEX MICROSCOPIC
BILIRUBIN URINE: NEGATIVE
GLUCOSE, UA: NEGATIVE mg/dL
HGB URINE DIPSTICK: NEGATIVE
KETONES UR: NEGATIVE mg/dL
Leukocytes, UA: NEGATIVE
Nitrite: NEGATIVE
Protein, ur: NEGATIVE mg/dL
Specific Gravity, Urine: 1.019 (ref 1.005–1.030)
pH: 5 (ref 5.0–8.0)

## 2016-02-17 LAB — BASIC METABOLIC PANEL
Anion gap: 8 (ref 5–15)
BUN: 21 mg/dL — AB (ref 6–20)
CALCIUM: 9.7 mg/dL (ref 8.9–10.3)
CHLORIDE: 111 mmol/L (ref 101–111)
CO2: 23 mmol/L (ref 22–32)
CREATININE: 0.81 mg/dL (ref 0.44–1.00)
GFR calc non Af Amer: >60 mL/min (ref >60)
GLUCOSE: 111 mg/dL — AB (ref 65–99)
Potassium: 4.2 mmol/L (ref 3.5–5.1)
Sodium: 142 mmol/L (ref 135–145)

## 2016-02-17 NOTE — ED Notes (Signed)
According to EMS, pt c/o generalized weakness and UTI s/s. Pt has been urinating on herself in public according to EMS. Pt A+OX4, speaking in complete sentences.

## 2016-02-17 NOTE — ED Provider Notes (Signed)
CSN: [id number]     Arrival date & time 02/17/16  0101 History  By signing my name below, I, [name], attest that this documentation has been prepared under the direction and in the presence of Veryl Speak, MD. Electronically Signed: Irene [name], ED Scribe. 02/17/2016. 3:12 AM.   Chief Complaint  Patient presents with  . Weakness  . Recurrent UTI   Patient is a 50 y.o. female presenting with weakness. The history is provided by the patient. No language interpreter was used.  Weakness This is a new problem. The problem occurs constantly. The problem has not changed since onset.Associated symptoms include shortness of breath. She has tried nothing for the symptoms.  HPI Comments: [name] is a 50 y.o. Female with a hx of DM, HTN, homelessness, and major depressive disorder with psychotic features brought in by EMS who presents to the Emergency Department complaining of generalized weakness onset one day ago. Pt reports a "recurrent UTI that won't go away with antibiotic treatment," leg swelling, SOB and near syncope yesterday. Her "UTI" symptoms include frequency and urgency, stating that she cannot make it to the restroom. Pt has not seen a urologist for the urinary symptoms. EMS reports that pt has been urinating on herself in public. She is not able to state whether or not she has had similar symptoms in the past. She denies other symptoms.   Past Medical History  Diagnosis Date  . Anxiety   . Major depressive disorder, recurrent, severe with psychotic features ([place])   . Diabetes mellitus without complication ([place])   . Hypertension    History reviewed. No pertinent past surgical history. Family History  Problem Relation Age of Onset  . Hypertension Other   . Diabetes Other   . Hypertension Mother   . Diabetes Mother   . Aneurysm Mother   . Hypertension Father   . Diabetes Father   . Hypertension Sister   . Diabetes Sister    Social History  Substance Use Topics  .  Smoking status: Never Smoker   . Smokeless tobacco: Never Used  . Alcohol Use: No   OB History    No data available     Review of Systems  Constitutional: Positive for fatigue.  Respiratory: Positive for shortness of breath.   Cardiovascular: Positive for leg swelling.  Genitourinary: Positive for urgency and frequency.  Neurological: Positive for light-headedness.  All other systems reviewed and are negative.  Allergies  Review of patient's allergies indicates no known allergies.  Home Medications   Prior to Admission medications   Medication Sig Start Date End Date Taking? Authorizing Provider  cephALEXin (KEFLEX) 500 MG capsule Take 1 capsule (500 mg total) by mouth 2 (two) times daily. Patient not taking: Reported on 02/17/2016 01/11/16   Tatyana Kirichenko, PA-C  ibuprofen (ADVIL,MOTRIN) 600 MG tablet Take 1 tablet (600 mg total) by mouth every 6 (six) hours as needed. Patient not taking: Reported on 01/10/2016 12/08/15   Marella Chimes, PA-C  potassium chloride 20 MEQ TBCR Take 10 mEq by mouth 2 (two) times daily. Patient not taking: Reported on 01/13/2016 01/11/16   Lahoma Rocker Kirichenko, PA-C  promethazine (PHENERGAN) 12.5 MG tablet Take 2 tablets (25 mg total) by mouth every 6 (six) hours as needed for nausea or vomiting. Patient not taking: Reported on 01/13/2016 01/11/16   Tatyana Kirichenko, PA-C   BP 115/87 mmHg  Pulse 63  Temp(Src) 98.1 F (36.7 C) (Oral)  Resp 18  Ht 5\' 5"  (1.651 m)  Wt 220 lb (99.791 kg)  BMI 36.61 kg/m2  SpO2 100%  LMP 12/25/2009 Physical Exam  Constitutional: She is oriented to person, place, and time. She appears well-developed and well-nourished. No distress.  HENT:  Head: Normocephalic and atraumatic.  Eyes: EOM are normal.  Neck: Normal range of motion.  Cardiovascular: Normal rate, regular rhythm and normal heart sounds.   Pulmonary/Chest: Effort normal and breath sounds normal.  Abdominal: Soft. She exhibits no distension. There is  no tenderness.  Musculoskeletal: Normal range of motion.  Neurological: She is alert and oriented to person, place, and time.  Skin: Skin is warm and dry.  Psychiatric: She has a normal mood and affect. Judgment normal.  Nursing note and vitals reviewed.   ED Course  Procedures (including critical care time) DIAGNOSTIC STUDIES: Oxygen Saturation is 100% on RA, normal by my interpretation.    COORDINATION OF CARE: 3:11 AM-Discussed treatment plan which includes labs and follow up with urology with pt at bedside and pt agreed to plan.   Labs Review Labs Reviewed  BASIC METABOLIC PANEL - Abnormal; Notable for the following:    Glucose, Bld 111 (*)    BUN 21 (*)    All other components within normal limits  CBC - Abnormal; Notable for the following:    Hemoglobin 11.2 (*)    HCT 35.5 (*)    MCH 25.9 (*)    RDW 15.7 (*)    All other components within normal limits  CBG MONITORING, ED - Abnormal; Notable for the following:    Glucose-Capillary 102 (*)    All other components within normal limits  URINALYSIS, ROUTINE W REFLEX MICROSCOPIC (NOT AT [facility])    Imaging Review No results found. I have personally reviewed and evaluated these images and lab results as part of my medical decision-making.    MDM   Final diagnoses:  None    Patient is a 50 year old female with history of diabetes, delusional disorder. She presents for evaluation of weakness, near syncope, generalized malaise, and urinary frequency. There is nothing in her workup which would explain any of these symptoms. Her physical examination and laboratory studies are reassuring. Her urine is clear. I see no indication for further workup and believe she is appropriate for discharge.  I personally performed the services described in this documentation, which was scribed in my presence. The recorded information has been reviewed and is accurate.         Veryl Speak, MD 02/17/16 [id number]

## 2016-02-17 NOTE — Discharge Instructions (Signed)
Follow-up with urology. The contact information for [facility] has been provided in this discharge summary for you to call and arrange this appointment.   Near-Syncope Near-syncope (commonly known as near fainting) is sudden weakness, dizziness, or feeling like you might pass out. During an episode of near-syncope, you may also develop pale skin, have tunnel vision, or feel sick to your stomach (nauseous). Near-syncope may occur when getting up after sitting or while standing for a long time. It is caused by a sudden decrease in blood flow to the brain. This decrease can result from various causes or triggers, most of which are not serious. However, because near-syncope can sometimes be a sign of something serious, a medical evaluation is required. The specific cause is often not determined. HOME CARE INSTRUCTIONS  Monitor your condition for any changes. The following actions may help to alleviate any discomfort you are experiencing:  Have someone stay with you until you feel stable.  Lie down right away and prop your feet up if you start feeling like you might faint. Breathe deeply and steadily. Wait until all the symptoms have passed. Most of these episodes last only a few minutes. You may feel tired for several hours.   Drink enough fluids to keep your urine clear or pale yellow.   If you are taking blood pressure or heart medicine, get up slowly when seated or lying down. Take several minutes to sit and then stand. This can reduce dizziness.  Follow up with your health care provider as directed. SEEK IMMEDIATE MEDICAL CARE IF:  1. You have a severe headache.  2. You have unusual pain in the chest, abdomen, or back.  3. You are bleeding from the mouth or rectum, or you have black or tarry stool.  4. You have an irregular or very fast heartbeat.  5. You have repeated fainting or have seizure-like jerking during an episode.  6. You faint when sitting or lying down.  7. You  have confusion.  8. You have difficulty walking.  9. You have severe weakness.  10. You have vision problems.  MAKE SURE YOU:   Understand these instructions.  Will watch your condition.  Will get help right away if you are not doing well or get worse.   This information is not intended to replace advice given to you by your health care provider. Make sure you discuss any questions you have with your health care provider.   Document Released: 10/13/2005 Document Revised: 10/18/2013 Document Reviewed: 03/18/2013 Elsevier Interactive Patient Education 2016 Elsevier Inc.  Urinary Incontinence Urinary incontinence is the involuntary loss of urine from your bladder. CAUSES  There are many causes of urinary incontinence. They include:  Medicines.  Infections.  Prostatic enlargement, leading to overflow of urine from your bladder.  Surgery.  Neurological diseases.  Emotional factors. SIGNS AND SYMPTOMS Urinary Incontinence can be divided into four types: 11. Urge incontinence. Urge incontinence is the involuntary loss of urine before you have the opportunity to go to the bathroom. There is a sudden urge to void but not enough time to reach a bathroom. 12. Stress incontinence. Stress incontinence is the sudden loss of urine with any activity that forces urine to pass. It is commonly caused by anatomical changes to the pelvis and sphincter areas of your body. 13. Overflow incontinence. Overflow incontinence is the loss of urine from an obstructed opening to your bladder. This results in a backup of urine and a resultant buildup of pressure within the bladder.  When the pressure within the bladder exceeds the closing pressure of the sphincter, the urine overflows, which causes incontinence, similar to water overflowing a dam. 14. Total incontinence. Total incontinence is the loss of urine as a result of the inability to store urine within your bladder. DIAGNOSIS  Evaluating the  cause of incontinence may require:  A thorough and complete medical and obstetric history.  A complete physical exam.  Laboratory tests such as a urine culture and sensitivities. When additional tests are indicated, they can include:  An ultrasound exam.  Kidney and bladder X-rays.  Cystoscopy. This is an exam of the bladder using a narrow scope.  Urodynamic testing to test the nerve function to the bladder and sphincter areas. TREATMENT  Treatment for urinary incontinence depends on the cause:  For urge incontinence caused by a bacterial infection, antibiotics will be prescribed. If the urge incontinence is related to medicines you take, your health care provider may have you change the medicine.  For stress incontinence, surgery to re-establish anatomical support to the bladder or sphincter, or both, will often correct the condition.  For overflow incontinence caused by an enlarged prostate, an operation to open the channel through the enlarged prostate will allow the flow of urine out of the bladder. In women with fibroids, a hysterectomy may be recommended.  For total incontinence, surgery on your urinary sphincter may help. An artificial urinary sphincter (an inflatable cuff placed around the urethra) may be required. In women who have developed a hole-like passage between their bladder and vagina (vesicovaginal fistula), surgery to close the fistula often is required. HOME CARE INSTRUCTIONS  Normal daily hygiene and the use of pads or adult diapers that are changed regularly will help prevent odors and skin damage.  Avoid caffeine. It can overstimulate your bladder.  Use the bathroom regularly. Try about every 2-3 hours to go to the bathroom, even if you do not feel the need to do so. Take time to empty your bladder completely. After urinating, wait a minute. Then try to urinate again.  For causes involving nerve dysfunction, keep a log of the medicines you take and a journal  of the times you go to the bathroom. SEEK MEDICAL CARE IF:  You experience worsening of pain instead of improvement in pain after your procedure.  Your incontinence becomes worse instead of better. SEE IMMEDIATE MEDICAL CARE IF:  You experience fever or shaking chills.  You are unable to pass your urine.  You have redness spreading into your groin or down into your thighs. MAKE SURE YOU:   Understand these instructions.   Will watch your condition.  Will get help right away if you are not doing well or get worse.   This information is not intended to replace advice given to you by your health care provider. Make sure you discuss any questions you have with your health care provider.   Document Released: 11/20/2004 Document Revised: 11/03/2014 Document Reviewed: 03/22/2013 Elsevier Interactive Patient Education [employer].

## 2016-03-28 ENCOUNTER — Encounter (HOSPITAL_COMMUNITY): Payer: Self-pay | Admitting: Emergency Medicine

## 2016-03-28 ENCOUNTER — Emergency Department (HOSPITAL_COMMUNITY)
Admission: EM | Admit: 2016-03-28 | Discharge: 2016-03-29 | Disposition: A | Discharge status: Home / Self Care | Payer: Self-pay | Attending: Emergency Medicine | Admitting: Emergency Medicine

## 2016-03-28 DIAGNOSIS — F4325 Adjustment disorder with mixed disturbance of emotions and conduct: Secondary | ICD-10-CM | POA: Insufficient documentation

## 2016-03-28 DIAGNOSIS — R251 Tremor, unspecified: Secondary | ICD-10-CM | POA: Insufficient documentation

## 2016-03-28 DIAGNOSIS — F419 Anxiety disorder, unspecified: Secondary | ICD-10-CM | POA: Diagnosis present

## 2016-03-28 DIAGNOSIS — R45851 Suicidal ideations: Secondary | ICD-10-CM

## 2016-03-28 DIAGNOSIS — E119 Type 2 diabetes mellitus without complications: Secondary | ICD-10-CM | POA: Insufficient documentation

## 2016-03-28 DIAGNOSIS — F329 Major depressive disorder, single episode, unspecified: Secondary | ICD-10-CM | POA: Insufficient documentation

## 2016-03-28 DIAGNOSIS — I10 Essential (primary) hypertension: Secondary | ICD-10-CM | POA: Insufficient documentation

## 2016-03-28 DIAGNOSIS — F22 Delusional disorders: Secondary | ICD-10-CM | POA: Insufficient documentation

## 2016-03-28 LAB — COMPREHENSIVE METABOLIC PANEL
ALT: 15 U/L (ref 14–54)
AST: 18 U/L (ref 15–41)
Albumin: 4.4 g/dL (ref 3.5–5.0)
Alkaline Phosphatase: 101 U/L (ref 38–126)
Anion gap: 8 (ref 5–15)
BUN: 23 mg/dL — ABNORMAL HIGH (ref 6–20)
CHLORIDE: 111 mmol/L (ref 101–111)
CO2: 23 mmol/L (ref 22–32)
Calcium: 9.2 mg/dL (ref 8.9–10.3)
Creatinine, Ser: 0.8 mg/dL (ref 0.44–1.00)
GFR calc non Af Amer: >60 mL/min (ref >60)
Glucose, Bld: 97 mg/dL (ref 65–99)
POTASSIUM: 3.8 mmol/L (ref 3.5–5.1)
SODIUM: 142 mmol/L (ref 135–145)
Total Bilirubin: 0.2 mg/dL — ABNORMAL LOW (ref 0.3–1.2)
Total Protein: 7.7 g/dL (ref 6.5–8.1)

## 2016-03-28 LAB — CBC WITH DIFFERENTIAL/PLATELET
Basophils Absolute: 0 10*3/uL (ref 0.0–0.1)
Basophils Relative: 0 %
EOS ABS: 0.2 10*3/uL (ref 0.0–0.7)
EOS PCT: 3 %
HCT: 35.4 % — ABNORMAL LOW (ref 36.0–46.0)
Hemoglobin: 11.1 g/dL — ABNORMAL LOW (ref 12.0–15.0)
LYMPHS ABS: 2 10*3/uL (ref 0.7–4.0)
Lymphocytes Relative: 35 %
MCH: 25.1 pg — AB (ref 26.0–34.0)
MCHC: 31.4 g/dL (ref 30.0–36.0)
MCV: 80.1 fL (ref 78.0–100.0)
Monocytes Absolute: 0.3 10*3/uL (ref 0.1–1.0)
Monocytes Relative: 4 %
Neutro Abs: 3.4 10*3/uL (ref 1.7–7.7)
Neutrophils Relative %: 58 %
PLATELETS: 260 10*3/uL (ref 150–400)
RBC: 4.42 MIL/uL (ref 3.87–5.11)
RDW: 14.3 % (ref 11.5–15.5)
WBC: 5.8 10*3/uL (ref 4.0–10.5)

## 2016-03-28 LAB — RAPID URINE DRUG SCREEN, HOSP PERFORMED
AMPHETAMINES: NOT DETECTED
Barbiturates: NOT DETECTED
Benzodiazepines: NOT DETECTED
COCAINE: NOT DETECTED
OPIATES: NOT DETECTED
TETRAHYDROCANNABINOL: NOT DETECTED

## 2016-03-28 LAB — SALICYLATE LEVEL: Salicylate Lvl: <4 mg/dL (ref 2.8–30.0)

## 2016-03-28 LAB — I-STAT BETA HCG BLOOD, ED (MC, WL, AP ONLY): I-stat hCG, quantitative: <5 m[IU]/mL (ref <5)

## 2016-03-28 LAB — ETHANOL: Alcohol, Ethyl (B): <5 mg/dL (ref <5)

## 2016-03-28 LAB — ACETAMINOPHEN LEVEL: Acetaminophen (Tylenol), Serum: <10 ug/mL — ABNORMAL LOW (ref 10–30)

## 2016-03-28 MED ORDER — LORAZEPAM 1 MG PO TABS: 1.0000 mg | ORAL_TABLET | Freq: Three times a day (TID) | ORAL | Status: DC | PRN

## 2016-03-28 MED ORDER — ONDANSETRON 4 MG PO TBDP
4.0000 mg | ORAL_TABLET | Freq: Once | ORAL | Status: AC
  Administered 2016-03-28: 4 mg via ORAL
  Filled 2016-03-28: qty 1

## 2016-03-28 MED ORDER — ACETAMINOPHEN 325 MG PO TABS: 650.0000 mg | ORAL_TABLET | ORAL | Status: DC | PRN

## 2016-03-28 MED ORDER — ALUM & MAG HYDROXIDE-SIMETH 200-200-20 MG/5ML PO SUSP: 30.0000 mL | ORAL | Status: DC | PRN

## 2016-03-28 MED ORDER — IBUPROFEN 200 MG PO TABS: 600.0000 mg | ORAL_TABLET | Freq: Three times a day (TID) | ORAL | Status: DC | PRN

## 2016-03-28 MED ORDER — ZOLPIDEM TARTRATE 5 MG PO TABS: 5.0000 mg | ORAL_TABLET | Freq: Every evening | ORAL | Status: DC | PRN

## 2016-03-28 NOTE — ED Notes (Signed)
Pt and pt's belongings wanded by security.

## 2016-03-28 NOTE — ED Notes (Signed)
PA at bedside. TTS at bedside.

## 2016-03-28 NOTE — ED Provider Notes (Signed)
CSN: [id number]     Arrival date & time 03/28/16  1021 History   First MD Initiated Contact with Patient 03/28/16 1117     Chief Complaint  Patient presents with  . Anxiety  . Tremors  . Paranoid     (Consider location/radiation/quality/duration/timing/severity/associated sxs/prior Treatment) Patient is a 50 y.o. female presenting with anxiety. The history is provided by the EMS personnel.  Anxiety    LEVEL 5 CAVEAT:  PSYCHIATRIC CONDITION 50 y.o. F with hx of anxiety, depression, DM, HTN, presenting to the ED for tremors.  History provided by EMS and nursing staff.  EMS was called to [facility] as patient was hyperventilating and acting hysterically. She was complaining of tremors as well.  Upon their arrival she initially calm down but then complaining of "someone hitting the dog". Patient apparently reported to EMS that people were out to get her and she complained to the [facility] about this.  On arrival to ED patient has no observed tremors, is lying in the bed calmly with her eyes closed. She will not answer any questions directly, she continues repeating "tremors" in a whisper. She also continues repeating "help me" and "Lord have mercy".  She states she lives at a shelter.  No medications taken currently.  Past Medical History  Diagnosis Date  . Anxiety   . Major depressive disorder, recurrent, severe with psychotic features ([place])   . Diabetes mellitus without complication ([place])   . Hypertension    History reviewed. No pertinent past surgical history. Family History  Problem Relation Age of Onset  . Hypertension Other   . Diabetes Other   . Hypertension Mother   . Diabetes Mother   . Aneurysm Mother   . Hypertension Father   . Diabetes Father   . Hypertension Sister   . Diabetes Sister    Social History  Substance Use Topics  . Smoking status: Never Smoker   . Smokeless tobacco: Never Used  . Alcohol Use: No   OB History    No data available     Review of  Systems  Unable to perform ROS: Psychiatric disorder      Allergies  Review of patient's allergies indicates no known allergies.  Home Medications   Prior to Admission medications   Medication Sig Start Date End Date Taking? Authorizing Provider  cephALEXin (KEFLEX) 500 MG capsule Take 1 capsule (500 mg total) by mouth 2 (two) times daily. Patient not taking: Reported on 02/17/2016 01/11/16   Tatyana Kirichenko, PA-C  ibuprofen (ADVIL,MOTRIN) 600 MG tablet Take 1 tablet (600 mg total) by mouth every 6 (six) hours as needed. Patient not taking: Reported on 01/10/2016 12/08/15   Marella Chimes, PA-C  potassium chloride 20 MEQ TBCR Take 10 mEq by mouth 2 (two) times daily. Patient not taking: Reported on 01/13/2016 01/11/16   Lahoma Rocker Kirichenko, PA-C  promethazine (PHENERGAN) 12.5 MG tablet Take 2 tablets (25 mg total) by mouth every 6 (six) hours as needed for nausea or vomiting. Patient not taking: Reported on 01/13/2016 01/11/16   Tatyana Kirichenko, PA-C   BP 120/70 mmHg  Pulse 69  Temp(Src) 98.2 F (36.8 C) (Oral)  Resp 18  SpO2 99%  LMP 12/25/2009   Physical Exam  Constitutional: She appears well-developed and well-nourished. No distress.  Lying in bed with eyes closed  HENT:  Head: Normocephalic and atraumatic.  Mouth/Throat: Oropharynx is clear and moist.  Eyes: Conjunctivae and EOM are normal. Pupils are equal, round, and reactive to light.  Neck: Normal range of motion. Neck supple.  Cardiovascular: Normal rate, regular rhythm and normal heart sounds.   Pulmonary/Chest: Effort normal and breath sounds normal. No respiratory distress. She has no wheezes.  Abdominal: Soft. Bowel sounds are normal.  Musculoskeletal: Normal range of motion.  Neurological: She is alert.  Awake, alert, not responding to questions when asked, only whispers "tremors", "help me", and "lord have mercy" repeatedly; no observed tremors or seizure activity  Skin: Skin is warm and dry. She is not  diaphoretic.  Psychiatric: She has a normal mood and affect.  Nursing note and vitals reviewed.   ED Course  Procedures (including critical care time) Labs Review Labs Reviewed  CBC WITH DIFFERENTIAL/PLATELET - Abnormal; Notable for the following:    Hemoglobin 11.1 (*)    HCT 35.4 (*)    MCH 25.1 (*)    All other components within normal limits  COMPREHENSIVE METABOLIC PANEL - Abnormal; Notable for the following:    BUN 23 (*)    Total Bilirubin 0.2 (*)    All other components within normal limits  ACETAMINOPHEN LEVEL - Abnormal; Notable for the following:    Acetaminophen (Tylenol), Serum <10 (*)    All other components within normal limits  ETHANOL  URINE RAPID DRUG SCREEN, HOSP PERFORMED  SALICYLATE LEVEL  I-STAT BETA HCG BLOOD, ED ([facility], [facility], [facility] ONLY)    Imaging Review No results found. I have personally reviewed and evaluated these images and lab results as part of my medical decision-making.   EKG Interpretation None      MDM   Final diagnoses:  Paranoia ([place])   50 year old female here with "tremors". EMS initially called for hyperventilation and paranoid behaviors at the local grocery store.  Patient has a long-standing psychiatric history and has been seen in the ED for similar symptoms multiple times in the past. Patient is awake and alert but answers my questions with repetitive phrases in a whispered tone.  Does not endorse any SI/HI/AVH directly but does not deny it either.  Will send psychiatric labs.  Plan for TTS consult.  Labs reassuring.  TTS consulted has evaluated patient, recommends IP admission.  Initially patient was receptive to this, however shortly after she became very agitated, was wandering the halls, and became very hostile when questioned about her psychiatric past.. Decision was made to IVC patient and admit her to behavioral health for her safety. Patient calm and cooperative at this time.  Larene Pickett, PA-C 03/28/16 [place], MD 03/29/16 [phone]

## 2016-03-28 NOTE — ED Notes (Signed)
Pt walking in hall. Steady gait. Pt redirected back to room.
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
## 2016-03-28 NOTE — BH Assessment (Signed)
[place] Assessment Progress Note   Patient declined voluntary admission after consenting to being admitted earlier this date. Patient is awake/alert at this time and is demanding to leave. This Probation officer spoke with PA Fernande Boyden who accompanied  this Probation officer to re-assess patient's needs. Patient was disorganized upon presentation and voiced multiple medical concerns that she felt were not being addressed at this location. Patient became agitated and due to her history of mental illness it was decided that patient would be a harm to herself if discharged. Willette Brace Jeon PA will investigate starting the paper work for an IVC. This Probation officer contacted [facility] to staff case and inform that an IVC was written. Lord DNP concurred and informed this Probation officer of IVC process.

## 2016-03-28 NOTE — ED Notes (Signed)
Bed: [id number] Expected date:  Expected time:  Means of arrival:  Comments: ER 25

## 2016-03-28 NOTE — ED Notes (Signed)
Per EMS pt comes from [employer] for hyperventilating, anxiety.  Patient stated once she calmed down that she saw "someone hit the dog. "  Reported to EMS that she reported to [facility] about the people that are out to get her.  CBG 97.  Patient states that she current;y not taking any medications.

## 2016-03-28 NOTE — Progress Notes (Signed)
CM spoke with pt who confirms uninsured [employer] resident with no pcp.  CM discussed and provided written information to assist pt with determining choice for uninsured accepting pcps, discussed the importance of pcp vs EDP services for f/u care, www.needymeds.org, www.goodrx.com, discounted pharmacies and other [employer] such as [employer] [facility] [employer], affordable care act, financial assistance, uninsured dental services, [place] med assist, DSS and  health department  Reviewed resources for [employer] uninsured accepting pcps like [facility], family medicine at [employer], [facility], [facility], local urgent care centers, [facility], [facility] family practice, general medical clinics, family services of the [place], [facility] urgent care plus others, medication resources, [facility] out patient pharmacies and housing Pt voiced understanding and appreciation of resources provided   Provided [employer] contact information  ED RN states pt has not spoke with Rn  Pt answered Cm and informed CM she did not have a pcp and she stated she was in [employer] "temporarily" Therefore will not be eligible for [facility] services  Pt agreed to have CM place resources in her pt belonging bag at nursing station across from ED rm #25

## 2016-03-28 NOTE — ED Notes (Signed)
TTS at bedside. 

## 2016-03-28 NOTE — BHH Counselor (Signed)
This Probation officer faxed out supporting documentation to the following psychiatric facilities for patient placement:  [place], [place]

## 2016-03-28 NOTE — ED Notes (Signed)
Patient denies SI and HI this time. Patient has been observed talking to an imaginary person while in her room. Patient would not elaborate on who she was seeing or speaking too. Plan of care discussed with patient. Patient voices no complaints or concerns at this time. Encouragement and support provided and safety maintain. Q 15 min safety checks remain in place.
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
## 2016-03-29 ENCOUNTER — Encounter (HOSPITAL_BASED_OUTPATIENT_CLINIC_OR_DEPARTMENT_OTHER): Payer: Self-pay

## 2016-03-29 DIAGNOSIS — F4325 Adjustment disorder with mixed disturbance of emotions and conduct: Secondary | ICD-10-CM

## 2016-03-29 DIAGNOSIS — F419 Anxiety disorder, unspecified: Secondary | ICD-10-CM | POA: Diagnosis present

## 2016-03-29 NOTE — BH Assessment (Signed)
Dr. Darleene Cleaver and Reginold Agent, NP evaluated patient this am and discharge home was recommended. Patient to follow up with her current outpatient provider at [place]. Patient given [facility] contact information (phone number and address).

## 2016-03-29 NOTE — Consult Note (Signed)
[facility] Face-to-Face Psychiatry Consult   Reason for Consult:  Anxiety  Referring Physician:  EDP Patient Identification: [name] MRN:  [id number] Principal Diagnosis: Adjustment disorder with mixed disturbance of emotions and conduct: Diagnosis:   Patient Active Problem List   Diagnosis Date Noted  . Adjustment disorder with mixed disturbance of emotions and conduct [F43.25] 04/15/2015    Priority: High  . Suicidal ideations [R45.851] 03/12/2015    Priority: High  . MDD (major depressive disorder), recurrent severe, without psychosis ([place]) [F33.2] 11/18/2015  . Homelessness [Z59.0] 11/18/2015  . Tobacco use disorder [F17.200] 07/05/2015  . Malnutrition of moderate degree ([place]) [E44.0] 05/31/2015  . Hypokalemia [E87.6] 05/30/2015  . Obstipation [K59.00] 05/30/2015  . Prolonged QT interval [I45.81] 05/30/2015  . Hypertension [I10] 05/30/2015  . Diabetes ([place]) [E11.9] 09/06/2012  . Delusional disorder ([place]) [F22] 04/16/2012    Total Time spent with patient: 45 minutes  Subjective:   [name] is a 50 y.o. female patient does not warrant admission.  HPI:  50 yo female who presented to the ED after "passing out in [facility] from anxiety."  She denies suicidal/homicidal ideations, hallucinations, alcohol/drug abuse, and paranoia.  She does not want inpatient hospitalization and receives therapy at [facility].  Does not desire medications as she feels her only issue is anxiety at times.  Stable for discharge.  Past Psychiatric History: depression  Risk to Self: Suicidal Ideation: Yes-Currently Present (UTA but did admitt on admission notes) Suicidal Intent:  (Unknown) Is patient at risk for suicide?:  (Unknown) Suicidal Plan?:  (Unknown) Access to Means:  (Unknown) What has been your use of drugs/alcohol within the last 12 months?:  (Unknown UTA) How many times?: 2 (per previous noted) Other Self Harm Risks: Unknown Triggers for Past Attempts: Unknown Intentional  Self Injurious Behavior:  (Unknown) Risk to Others: Homicidal Ideation:  (Unknown) Thoughts of Harm to Others:  (Unknown) Current Homicidal Intent:  (Unknown) Current Homicidal Plan:  (Unknown) Access to Homicidal Means:  (Unknown) Identified Victim: NA History of harm to others?:  (Unknown) Assessment of Violence:  (Unknown) Violent Behavior Description: UTA Does patient have access to weapons?:  (Unknown) Criminal Charges Pending?:  (Unknown) Does patient have a court date:  (Unknown) Prior Inpatient Therapy: Prior Inpatient Therapy: Yes Prior Therapy Dates: 2016 Prior Therapy Facilty/Provider(s): [place] Reason for Treatment: SI, Depression Prior Outpatient Therapy: Prior Outpatient Therapy:  (Unknown) Prior Therapy Dates: Unknown Prior Therapy Facilty/Provider(s):  (UNknown) Reason for Treatment:  (Unknown) Does patient have an ACCT team?: Unknown Does patient have Intensive In-House Services?  : Unknown Does patient have [facility] services? : Unknown Does patient have P4CC services?: Unknown  Past Medical History:  Past Medical History  Diagnosis Date  . Anxiety   . Major depressive disorder, recurrent, severe with psychotic features ([place])   . Diabetes mellitus without complication ([place])   . Hypertension    History reviewed. No pertinent past surgical history. Family History:  Family History  Problem Relation Age of Onset  . Hypertension Other   . Diabetes Other   . Hypertension Mother   . Diabetes Mother   . Aneurysm Mother   . Hypertension Father   . Diabetes Father   . Hypertension Sister   . Diabetes Sister    Family Psychiatric  History: none Social History:  History  Alcohol Use No     History  Drug Use No    Social History   Social History  . Marital Status: Single    Spouse Name: N/A  .  Number of Children: N/A  . Years of Education: N/A   Social History Main Topics  . Smoking status: Never Smoker   . Smokeless tobacco: Never Used  . Alcohol  Use: No  . Drug Use: No  . Sexual Activity: No   Other Topics Concern  . None   Social History Narrative   Additional Social History:    Allergies:  No Known Allergies  Labs:  Results for orders placed or performed during the hospital encounter of 03/28/16 (from the past 48 hour(s))  CBC with Differential     Status: Abnormal   Collection Time: 03/28/16 11:49 AM  Result Value Ref Range   WBC 5.8 4.0 - 10.5 K/uL   RBC 4.42 3.87 - 5.11 MIL/uL   Hemoglobin 11.1 (L) 12.0 - 15.0 g/dL   HCT 35.4 (L) 36.0 - 46.0 %   MCV 80.1 78.0 - 100.0 fL   MCH 25.1 (L) 26.0 - 34.0 pg   MCHC 31.4 30.0 - 36.0 g/dL   RDW 14.3 11.5 - 15.5 %   Platelets 260 150 - 400 K/uL   Neutrophils Relative % 58 %   Neutro Abs 3.4 1.7 - 7.7 K/uL   Lymphocytes Relative 35 %   Lymphs Abs 2.0 0.7 - 4.0 K/uL   Monocytes Relative 4 %   Monocytes Absolute 0.3 0.1 - 1.0 K/uL   Eosinophils Relative 3 %   Eosinophils Absolute 0.2 0.0 - 0.7 K/uL   Basophils Relative 0 %   Basophils Absolute 0.0 0.0 - 0.1 K/uL  Comprehensive metabolic panel     Status: Abnormal   Collection Time: 03/28/16 11:49 AM  Result Value Ref Range   Sodium 142 135 - 145 mmol/L   Potassium 3.8 3.5 - 5.1 mmol/L   Chloride 111 101 - 111 mmol/L   CO2 23 22 - 32 mmol/L   Glucose, Bld 97 65 - 99 mg/dL   BUN 23 (H) 6 - 20 mg/dL   Creatinine, Ser 0.80 0.44 - 1.00 mg/dL   Calcium 9.2 8.9 - 10.3 mg/dL   Total Protein 7.7 6.5 - 8.1 g/dL   Albumin 4.4 3.5 - 5.0 g/dL   AST 18 15 - 41 U/L   ALT 15 14 - 54 U/L   Alkaline Phosphatase 101 38 - 126 U/L   Total Bilirubin 0.2 (L) 0.3 - 1.2 mg/dL   GFR calc non Af Amer >60 >60 mL/min   GFR calc Af Amer >60 >60 mL/min    Comment: (NOTE) The eGFR has been calculated using the CKD EPI equation. This calculation has not been validated in all clinical situations. eGFR's persistently <60 mL/min signify possible Chronic Kidney Disease.    Anion gap 8 5 - 15  Ethanol     Status: None   Collection Time:  03/28/16 11:49 AM  Result Value Ref Range   Alcohol, Ethyl (B) <5 <5 mg/dL    Comment:        LOWEST DETECTABLE LIMIT FOR SERUM ALCOHOL IS 5 mg/dL FOR MEDICAL PURPOSES ONLY   Salicylate level     Status: None   Collection Time: 03/28/16 11:49 AM  Result Value Ref Range   Salicylate Lvl <4.5 2.8 - 30.0 mg/dL  Acetaminophen level     Status: Abnormal   Collection Time: 03/28/16 11:49 AM  Result Value Ref Range   Acetaminophen (Tylenol), Serum <10 (L) 10 - 30 ug/mL    Comment:        THERAPEUTIC CONCENTRATIONS VARY SIGNIFICANTLY. A RANGE  OF 10-30 ug/mL MAY BE AN EFFECTIVE CONCENTRATION FOR MANY PATIENTS. HOWEVER, SOME ARE BEST TREATED AT CONCENTRATIONS OUTSIDE THIS RANGE. ACETAMINOPHEN CONCENTRATIONS >150 ug/mL AT 4 HOURS AFTER INGESTION AND >50 ug/mL AT 12 HOURS AFTER INGESTION ARE OFTEN ASSOCIATED WITH TOXIC REACTIONS.   I-Stat Beta hCG blood, ED ([facility], [facility], [facility] only)     Status: None   Collection Time: 03/28/16 11:56 AM  Result Value Ref Range   I-stat hCG, quantitative <5.0 <5 mIU/mL   Comment 3            Comment:   GEST. AGE      CONC.  (mIU/mL)   <=1 WEEK        5 - 50     2 WEEKS       50 - 500     3 WEEKS       100 - 10,000     4 WEEKS     1,000 - 30,000        FEMALE AND NON-PREGNANT FEMALE:     LESS THAN 5 mIU/mL   Urine rapid drug screen (hosp performed)     Status: None   Collection Time: 03/28/16  3:23 PM  Result Value Ref Range   Opiates NONE DETECTED NONE DETECTED   Cocaine NONE DETECTED NONE DETECTED   Benzodiazepines NONE DETECTED NONE DETECTED   Amphetamines NONE DETECTED NONE DETECTED   Tetrahydrocannabinol NONE DETECTED NONE DETECTED   Barbiturates NONE DETECTED NONE DETECTED    Comment:        DRUG SCREEN FOR MEDICAL PURPOSES ONLY.  IF CONFIRMATION IS NEEDED FOR ANY PURPOSE, NOTIFY LAB WITHIN 5 DAYS.        LOWEST DETECTABLE LIMITS FOR URINE DRUG SCREEN Drug Class       Cutoff (ng/mL) Amphetamine      1000 Barbiturate       200 Benzodiazepine   494 Tricyclics       496 Opiates          300 Cocaine          300 THC              50     Current Facility-Administered Medications  Medication Dose Route Frequency Provider Last Rate Last Dose  . acetaminophen (TYLENOL) tablet 650 mg  650 mg Oral Q4H PRN Larene Pickett, PA-C      . alum & mag hydroxide-simeth (MAALOX/MYLANTA) 200-200-20 MG/5ML suspension 30 mL  30 mL Oral PRN Larene Pickett, PA-C      . ibuprofen (ADVIL,MOTRIN) tablet 600 mg  600 mg Oral Q8H PRN Larene Pickett, PA-C      . zolpidem [facility]) tablet 5 mg  5 mg Oral QHS PRN Larene Pickett, PA-C       Current Outpatient Prescriptions  Medication Sig Dispense Refill  . cephALEXin (KEFLEX) 500 MG capsule Take 1 capsule (500 mg total) by mouth 2 (two) times daily. (Patient not taking: Reported on 02/17/2016) 14 capsule 0  . ibuprofen (ADVIL,MOTRIN) 600 MG tablet Take 1 tablet (600 mg total) by mouth every 6 (six) hours as needed. (Patient not taking: Reported on 01/10/2016) 30 tablet 0  . potassium chloride 20 MEQ TBCR Take 10 mEq by mouth 2 (two) times daily. (Patient not taking: Reported on 01/13/2016) 10 tablet 0  . promethazine (PHENERGAN) 12.5 MG tablet Take 2 tablets (25 mg total) by mouth every 6 (six) hours as needed for nausea or vomiting. (Patient not taking: Reported on  01/13/2016) 10 tablet 0    Musculoskeletal: Strength & Muscle Tone: within normal limits Gait & Station: normal Patient leans: N/A  Psychiatric Specialty Exam: Physical Exam  Constitutional: She is oriented to person, place, and time. She appears well-developed and well-nourished.  HENT:  Head: Normocephalic.  Neck: Normal range of motion.  Respiratory: Effort normal.  GI: Soft.  Musculoskeletal: Normal range of motion.  Neurological: She is alert and oriented to person, place, and time.  Skin: Skin is warm and dry.  Psychiatric: Her speech is normal and behavior is normal. Judgment and thought content normal. Her affect  is blunt. Cognition and memory are normal.    Review of Systems  Constitutional: Negative.   HENT: Negative.   Eyes: Negative.   Respiratory: Negative.   Cardiovascular: Negative.   Gastrointestinal: Negative.   Genitourinary: Negative.   Musculoskeletal: Negative.   Skin: Negative.   Neurological: Negative.   Endo/Heme/Allergies: Negative.   Psychiatric/Behavioral: Negative.     Blood pressure 115/75, pulse 48, temperature 97.5 F (36.4 C), temperature source Oral, resp. rate 18, last menstrual period 12/25/2009, SpO2 100 %.There is no weight on file to calculate BMI.  General Appearance: Casual  Eye Contact:  Good  Speech:  Normal Rate  Volume:  Normal  Mood:  Euthymic  Affect:  Congruent  Thought Process:  Coherent  Orientation:  Full (Time, Place, and Person)  Thought Content:  WDL  Suicidal Thoughts:  No  Homicidal Thoughts:  No  Memory:  Immediate;   Good Recent;   Good Remote;   Good  Judgement:  Fair  Insight:  Fair  Psychomotor Activity:  Normal  Concentration:  Concentration: Good and Attention Span: Good  Recall:  Good  Fund of Knowledge:  Good  Language:  Good  Akathisia:  No  Handed:  Right  AIMS (if indicated):     Assets:  Leisure Time Physical Health Resilience Social Support  ADL's:  Intact  Cognition:  WNL  Sleep:        Treatment Plan Summary: Daily contact with patient to assess and evaluate symptoms and progress in treatment, Medication management and Plan Adjustment disorder with mixed disturbance of emotions and conduct:  -Crisis stabilization -Medication management:  PRN medications in place along with Ambien 5 mg at bedtime for sleep -Individual counseling  Disposition: No evidence of imminent risk to self or others at present.    Waylan Boga, NP 03/29/2016 11:13 AM Patient seen face-to-face for psychiatric evaluation, chart reviewed and case discussed with the physician extender and developed treatment plan. Reviewed the  information documented and agree with the treatment plan. Corena Pilgrim, MD

## 2016-03-29 NOTE — [facility] Suicide Risk Assessment (Signed)
Suicide Risk Assessment  Discharge Assessment   [facility] Discharge Suicide Risk Assessment   Principal Problem: Adjustment disorder with mixed disturbance of emotions and conduct Discharge Diagnoses:  Patient Active Problem List   Diagnosis Date Noted  . Anxiety [F41.9] 03/29/2016    Priority: High  . Adjustment disorder with mixed disturbance of emotions and conduct [F43.25] 04/15/2015    Priority: High  . Suicidal ideations [R45.851] 03/12/2015    Priority: High  . MDD (major depressive disorder), recurrent severe, without psychosis ([place]) [F33.2] 11/18/2015  . Homelessness [Z59.0] 11/18/2015  . Tobacco use disorder [F17.200] 07/05/2015  . Malnutrition of moderate degree ([place]) [E44.0] 05/31/2015  . Hypokalemia [E87.6] 05/30/2015  . Obstipation [K59.00] 05/30/2015  . Prolonged QT interval [I45.81] 05/30/2015  . Hypertension [I10] 05/30/2015  . Diabetes ([place]) [E11.9] 09/06/2012  . Delusional disorder ([place]) [F22] 04/16/2012    Total Time spent with patient: 45 minutes  Musculoskeletal: Strength & Muscle Tone: within normal limits Gait & Station: normal Patient leans: N/A  Psychiatric Specialty Exam: Physical Exam  Constitutional: She is oriented to person, place, and time. She appears well-developed and well-nourished.  HENT:  Head: Normocephalic.  Neck: Normal range of motion.  Respiratory: Effort normal.  GI: Soft.  Musculoskeletal: Normal range of motion.  Neurological: She is alert and oriented to person, place, and time.  Skin: Skin is warm and dry.  Psychiatric: Her speech is normal and behavior is normal. Judgment and thought content normal. Her affect is blunt. Cognition and memory are normal.    Review of Systems  Constitutional: Negative.   HENT: Negative.   Eyes: Negative.   Respiratory: Negative.   Cardiovascular: Negative.   Gastrointestinal: Negative.   Genitourinary: Negative.   Musculoskeletal: Negative.   Skin: Negative.   Neurological: Negative.    Endo/Heme/Allergies: Negative.   Psychiatric/Behavioral: Negative.     Blood pressure 115/75, pulse 48, temperature 97.5 F (36.4 C), temperature source Oral, resp. rate 18, last menstrual period 12/25/2009, SpO2 100 %.There is no weight on file to calculate BMI.  General Appearance: Casual  Eye Contact:  Good  Speech:  Normal Rate  Volume:  Normal  Mood:  Euthymic  Affect:  Congruent  Thought Process:  Coherent  Orientation:  Full (Time, Place, and Person)  Thought Content:  WDL  Suicidal Thoughts:  No  Homicidal Thoughts:  No  Memory:  Immediate;   Good Recent;   Good Remote;   Good  Judgement:  Fair  Insight:  Fair  Psychomotor Activity:  Normal  Concentration:  Concentration: Good and Attention Span: Good  Recall:  Good  Fund of Knowledge:  Good  Language:  Good  Akathisia:  No  Handed:  Right  AIMS (if indicated):     Assets:  Leisure Time Physical Health Resilience Social Support  ADL's:  Intact  Cognition:  WNL  Sleep:       Mental Status Per Nursing Assessment::   On Admission:   Anxiety  Demographic Factors:  Living alone  Loss Factors: NA  Historical Factors: NA  Risk Reduction Factors:   Sense of responsibility to family, Positive social support and Positive therapeutic relationship  Continued Clinical Symptoms:  None  Cognitive Features That Contribute To Risk:  None    Suicide Risk:  Minimal: No identifiable suicidal ideation.  Patients presenting with no risk factors but with morbid ruminations; may be classified as minimal risk based on the severity of the depressive symptoms    Plan Of Care/Follow-up recommendations:  Activity:  as tolerateed Diet:  heart healthy diet  LORD, JAMISON, NP 03/29/2016, 12:27 PM

## 2016-07-18 ENCOUNTER — Emergency Department (HOSPITAL_COMMUNITY)
Admission: EM | Admit: 2016-07-18 | Discharge: 2016-07-18 | Discharge status: Against Medical Advice | Payer: Self-pay | Attending: Emergency Medicine | Admitting: Emergency Medicine

## 2016-07-18 ENCOUNTER — Encounter (HOSPITAL_COMMUNITY): Payer: Self-pay | Admitting: *Deleted

## 2016-07-18 DIAGNOSIS — E119 Type 2 diabetes mellitus without complications: Secondary | ICD-10-CM | POA: Insufficient documentation

## 2016-07-18 DIAGNOSIS — Z79899 Other long term (current) drug therapy: Secondary | ICD-10-CM | POA: Insufficient documentation

## 2016-07-18 DIAGNOSIS — I10 Essential (primary) hypertension: Secondary | ICD-10-CM | POA: Insufficient documentation

## 2016-07-18 DIAGNOSIS — F22 Delusional disorders: Secondary | ICD-10-CM | POA: Insufficient documentation

## 2016-07-18 DIAGNOSIS — R42 Dizziness and giddiness: Secondary | ICD-10-CM

## 2016-07-18 LAB — CBG MONITORING, ED: GLUCOSE-CAPILLARY: 101 mg/dL — AB (ref 65–99)

## 2016-07-18 NOTE — ED Notes (Signed)
PT would not allow staff to complete EKG or CBG RN have been made aware

## 2016-07-18 NOTE — ED Notes (Signed)
Patient declined discharge vitals and all labs.

## 2016-07-18 NOTE — ED Notes (Signed)
Bed: WTR9 Expected date:  Expected time:  Means of arrival:  Comments: 
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
## 2016-07-18 NOTE — ED Provider Notes (Signed)
[place] DEPT Provider Note   CSN: [id number] Arrival date & time: 07/18/16  1727     History   Chief Complaint Chief Complaint  Patient presents with  . Dizziness   Level 5 caveat due to psychiatric disorder. HPI [name] is a 50 y.o. female.  The history is provided by the patient.  Dizziness  Patient was brought in after feeling dizzy at the police office. She was there to make complaints about people that have been harassing her. States she needs to talk to the [employer] now. Does have a history of psychotic issues. Get seen at family services and states she is supposed to get seen in 2 weeks. Denies substance abuse. Difficult to get history from. Patient is not eager to stay in the ER. States she is not on medications that has been on medications for anxiety in the past. States she has felt dizzy at the police station as what she is here. No chest pain. No trouble breathing.  Past Medical History:  Diagnosis Date  . Anxiety   . Diabetes mellitus without complication ([place])   . Hypertension   . Major depressive disorder, recurrent, severe with psychotic features [facility])     Patient Active Problem List   Diagnosis Date Noted  . Anxiety 03/29/2016  . MDD (major depressive disorder), recurrent severe, without psychosis ([place]) 11/18/2015  . Homelessness 11/18/2015  . Tobacco use disorder 07/05/2015  . Malnutrition of moderate degree ([place]) 05/31/2015  . Hypokalemia 05/30/2015  . Obstipation 05/30/2015  . Prolonged QT interval 05/30/2015  . Hypertension 05/30/2015  . 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 Suicidal ideations 03/12/2015  . Diabetes ([place]) 09/06/2012  . Delusional disorder ([place]) 04/16/2012    History reviewed. No pertinent surgical history.  OB History    No data available       Home Medications    Prior to Admission medications   Medication Sig Start Date End Date Taking? Authorizing Provider  cephALEXin  (KEFLEX) 500 MG capsule Take 1 capsule (500 mg total) by mouth 2 (two) times daily. Patient not taking: Reported on 07/18/2016 01/11/16   Tatyana Kirichenko, PA-C  ibuprofen (ADVIL,MOTRIN) 600 MG tablet Take 1 tablet (600 mg total) by mouth every 6 (six) hours as needed. Patient not taking: Reported on 07/18/2016 12/08/15   Marella Chimes, PA-C  potassium chloride 20 MEQ TBCR Take 10 mEq by mouth 2 (two) times daily. Patient not taking: Reported on 07/18/2016 01/11/16   Jeannett Senior, PA-C  promethazine (PHENERGAN) 12.5 MG tablet Take 2 tablets (25 mg total) by mouth every 6 (six) hours as needed for nausea or vomiting. Patient not taking: Reported on 07/18/2016 01/11/16   Jeannett Senior, PA-C    Family History Family History  Problem Relation Age of Onset  . Hypertension Mother   . Diabetes Mother   . Aneurysm Mother   . Hypertension Father   . Diabetes Father   . Hypertension Sister   . Diabetes Sister   . Hypertension Other   . Diabetes Other     Social History Social History  Substance Use Topics  . Smoking status: Never Smoker  . Smokeless tobacco: Never Used  . Alcohol use No     Allergies   Review of patient's allergies indicates no known allergies.   Review of Systems Review of Systems  Unable to perform ROS: Psychiatric disorder  Constitutional: Negative for appetite change.  HENT: Negative for trouble swallowing.   Neurological: Positive  for dizziness.     Physical Exam Updated Vital Signs LMP 12/25/2009   Physical Exam  Constitutional: She appears well-developed.  HENT:  Head: Atraumatic.  Neck: Neck supple.  Cardiovascular: Normal rate.   Pulmonary/Chest: Effort normal.  Abdominal: Soft.  Musculoskeletal: She exhibits no edema.  Neurological: She is alert.  Skin: Skin is warm. Capillary refill takes less than 2 seconds.  Psychiatric:  Patient states that she has information to give to the [employer]. Somewhat delusional but otherwise  appropriate.     ED Treatments / Results  Labs (all labs ordered are listed, but only abnormal results are displayed) Labs Reviewed  CBG MONITORING, ED - Abnormal; Notable for the following:       Result Value   Glucose-Capillary 101 (*)    All other components within normal limits  COMPREHENSIVE METABOLIC PANEL  URINE RAPID DRUG SCREEN, HOSP PERFORMED  CBC WITH DIFFERENTIAL/PLATELET  ETHANOL    EKG  EKG Interpretation None       Radiology No results found.  Procedures Procedures (including critical care time)  Medications Ordered in ED Medications - No data to display   Initial Impression / Assessment and Plan / ED Course  I have reviewed the triage vital signs and the nursing notes.  Pertinent labs & imaging results that were available during my care of the patient were reviewed by me and considered in my medical decision making (see chart for details).  Clinical Course    Patient presented for dizziness but she seems delusional. Does not appear to be too much of a risk to herself. Lives at [place]. At this point I did not think average. IVC her although she has a somewhat tenuous grasp unreality at this time. Will discharge. Refused blood work and EKG and has been worked up for the dizziness in the past.  Final Clinical Impressions(s) / ED Diagnoses   Final diagnoses:  Delusion ([place])  Dizziness    New Prescriptions New Prescriptions   No medications on file     [name], MD 07/18/16 [phone]

## 2016-07-18 NOTE — BH Assessment (Signed)
[place] Assessment Progress Note This Administrator, sports MD both spoke to patient to evaluate mental status. Patient stated she presented to [facility] for feeling faint and declined a psychiatric evaluation stating she felt she was just dehydrated. Patient is currently residing at [facility] and can return there per patient. Patient denies any H/I, S/I or AVH. Patient is asking to be discharged and is need of a bus pass. This Probation officer discussed case with Alvino Chapel MD who recommended patient be discharged this date and did not meet criteria for an inpatient admission. Patient was discharged and provided a buss pass.
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
## 2016-07-18 NOTE — ED Notes (Signed)
Patient declined EKG.

## 2016-08-06 ENCOUNTER — Emergency Department (HOSPITAL_COMMUNITY)
Admission: EM | Admit: 2016-08-06 | Discharge: 2016-08-06 | Disposition: A | Discharge status: Home / Self Care | Payer: Self-pay | Attending: Emergency Medicine | Admitting: Emergency Medicine

## 2016-08-06 ENCOUNTER — Encounter (HOSPITAL_COMMUNITY): Payer: Self-pay | Admitting: *Deleted

## 2016-08-06 DIAGNOSIS — I10 Essential (primary) hypertension: Secondary | ICD-10-CM | POA: Insufficient documentation

## 2016-08-06 DIAGNOSIS — E119 Type 2 diabetes mellitus without complications: Secondary | ICD-10-CM | POA: Insufficient documentation

## 2016-08-06 DIAGNOSIS — R4583 Excessive crying of child, adolescent or adult: Secondary | ICD-10-CM | POA: Insufficient documentation

## 2016-08-06 DIAGNOSIS — F329 Major depressive disorder, single episode, unspecified: Secondary | ICD-10-CM | POA: Insufficient documentation

## 2016-08-06 NOTE — ED Notes (Signed)
Bed: WLPT4 Expected date:  Expected time:  Means of arrival:  Comments: Medical Clearance

## 2016-08-06 NOTE — ED Triage Notes (Signed)
Pt escorted voluntarily to ED by [employer]. Pt sent by [facility] for psychiatric evaluation. Pt states she went to [facility] today for a women's empowerment group and became tearful. Pt denies SI, HI, hallucinations.

## 2016-08-06 NOTE — ED Provider Notes (Signed)
[facility] DEPT Provider Note   CSN: [id number] Arrival date & time: 08/06/16  1635     History   Chief Complaint Chief Complaint  Patient presents with  . Psychiatric Evaluation    HPI [name] is a 50 y.o. female.  50 yo F with a chief complaint of excessive crying while at a women's group meeting. Patient states she really got caught up in the discussion. She now feels a bit tired and emotionally drained but denies any homicidal or suicidal ideation. She denies any hallucinations. She has been taking her medications as prescribed.   The history is provided by the patient.  Mental Health Problem  Presenting symptoms: depression   Degree of incapacity (severity):  Moderate Onset quality:  Sudden Duration:  1 hour Timing:  Rare Progression:  Resolved Chronicity:  Recurrent Treatment compliance:  All of the time Relieved by:  Nothing Worsened by:  Nothing Ineffective treatments:  None tried Associated symptoms: no chest pain and no headaches     Past Medical History:  Diagnosis Date  . Anxiety   . Diabetes mellitus without complication ([place])   . Hypertension   . Major depressive disorder, recurrent, severe with psychotic features [facility])     Patient Active Problem List   Diagnosis Date Noted  . Anxiety 03/29/2016  . MDD (major depressive disorder), recurrent severe, without psychosis ([place]) 11/18/2015  . Homelessness 11/18/2015  . Tobacco use disorder 07/05/2015  . Malnutrition of moderate degree ([place]) 05/31/2015  . Hypokalemia 05/30/2015  . Obstipation 05/30/2015  . Prolonged QT interval 05/30/2015  . Hypertension 05/30/2015  . 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 Suicidal ideations 03/12/2015  . Diabetes ([place]) 09/06/2012  . Delusional disorder ([place]) 04/16/2012    History reviewed. No pertinent surgical history.  OB History    No data available       Home Medications    Prior to Admission  medications   Medication Sig Start Date End Date Taking? Authorizing Provider  cephALEXin (KEFLEX) 500 MG capsule Take 1 capsule (500 mg total) by mouth 2 (two) times daily. Patient not taking: Reported on 08/06/2016 01/11/16   Tatyana Kirichenko, PA-C  ibuprofen (ADVIL,MOTRIN) 600 MG tablet Take 1 tablet (600 mg total) by mouth every 6 (six) hours as needed. Patient not taking: Reported on 08/06/2016 12/08/15   Marella Chimes, PA-C  potassium chloride 20 MEQ TBCR Take 10 mEq by mouth 2 (two) times daily. Patient not taking: Reported on 08/06/2016 01/11/16   Tatyana Kirichenko, PA-C  promethazine (PHENERGAN) 12.5 MG tablet Take 2 tablets (25 mg total) by mouth every 6 (six) hours as needed for nausea or vomiting. Patient not taking: Reported on 08/06/2016 01/11/16   Jeannett Senior, PA-C    Family History Family History  Problem Relation Age of Onset  . Hypertension Mother   . Diabetes Mother   . Aneurysm Mother   . Hypertension Father   . Diabetes Father   . Hypertension Sister   . Diabetes Sister   . Hypertension Other   . Diabetes Other     Social History Social History  Substance Use Topics  . Smoking status: Never Smoker  . Smokeless tobacco: Never Used  . Alcohol use No     Allergies   Review of patient's allergies indicates no known allergies.   Review of Systems Review of Systems  Constitutional: Negative for chills and fever.  HENT: Negative for congestion and rhinorrhea.   Eyes: Negative for redness  and visual disturbance.  Respiratory: Negative for shortness of breath and wheezing.   Cardiovascular: Negative for chest pain and palpitations.  Gastrointestinal: Negative for nausea and vomiting.  Genitourinary: Negative for dysuria and urgency.  Musculoskeletal: Negative for arthralgias and myalgias.  Skin: Negative for pallor and wound.  Neurological: Negative for dizziness and headaches.  Psychiatric/Behavioral: Positive for dysphoric mood.      Physical Exam Updated Vital Signs BP 105/77 (BP Location: Right Arm)   Pulse 69   Temp 98.6 F (37 C) (Oral)   Resp 18   LMP 12/25/2009   SpO2 100%   Physical Exam  Constitutional: She is oriented to person, place, and time. She appears well-developed and well-nourished. No distress.  HENT:  Head: Normocephalic and atraumatic.  Eyes: EOM are normal. Pupils are equal, round, and reactive to light.  Neck: Normal range of motion. Neck supple.  Cardiovascular: Normal rate and regular rhythm.  Exam reveals no gallop and no friction rub.   No murmur heard. Pulmonary/Chest: Effort normal. She has no wheezes. She has no rales.  Abdominal: Soft. She exhibits no distension and no mass. There is no tenderness. There is no guarding.  Musculoskeletal: She exhibits no edema or tenderness.  Neurological: She is alert and oriented to person, place, and time.  Skin: Skin is warm and dry. She is not diaphoretic.  Psychiatric: She has a normal mood and affect. Her behavior is normal.  Nursing note and vitals reviewed.    ED Treatments / Results  Labs (all labs ordered are listed, but only abnormal results are displayed) Labs Reviewed - No data to display  EKG  EKG Interpretation None       Radiology No results found.  Procedures Procedures (including critical care time)  Medications Ordered in ED Medications - No data to display   Initial Impression / Assessment and Plan / ED Course  I have reviewed the triage vital signs and the nursing notes.  Pertinent labs & imaging results that were available during my care of the patient were reviewed by me and considered in my medical decision making (see chart for details).  Clinical Course    50 yo F With a chief complaints of excessive crying. This is resolved. She appears to sound mind regarding my discussion. She is not suicidal or homicidal or having hallucinations. I do not feel that she needs psych evaluation at this  time. Discharge home.  5:41 PM:  I have discussed the diagnosis/risks/treatment options with the patient and believe the pt to be eligible for discharge home to follow-up with PCP. We also discussed returning to the ED immediately if new or worsening sx occur. We discussed the sx which are most concerning (e.g., SI, HI, hallucinations) that necessitate immediate return. Medications administered to the patient during their visit and any new prescriptions provided to the patient are listed below.  Medications given during this visit Medications - No data to display   The patient appears reasonably screen and/or stabilized for discharge and I doubt any other medical condition or other [facility] requiring further screening, evaluation, or treatment in the ED at this time prior to discharge.    Final Clinical Impressions(s) / ED Diagnoses   Final diagnoses:  Excessive crying    New Prescriptions New Prescriptions   No medications on file     Deno Etienne, DO 08/06/16 1742

## 2016-09-18 ENCOUNTER — Emergency Department (HOSPITAL_COMMUNITY): Payer: Self-pay

## 2016-09-18 ENCOUNTER — Emergency Department (HOSPITAL_COMMUNITY)
Admission: EM | Admit: 2016-09-18 | Discharge: 2016-09-18 | Disposition: A | Discharge status: Home / Self Care | Payer: Self-pay | Attending: Emergency Medicine | Admitting: Emergency Medicine

## 2016-09-18 ENCOUNTER — Encounter (HOSPITAL_COMMUNITY): Payer: Self-pay | Admitting: Emergency Medicine

## 2016-09-18 DIAGNOSIS — R1013 Epigastric pain: Secondary | ICD-10-CM

## 2016-09-18 DIAGNOSIS — E119 Type 2 diabetes mellitus without complications: Secondary | ICD-10-CM | POA: Insufficient documentation

## 2016-09-18 DIAGNOSIS — I10 Essential (primary) hypertension: Secondary | ICD-10-CM | POA: Insufficient documentation

## 2016-09-18 DIAGNOSIS — R112 Nausea with vomiting, unspecified: Secondary | ICD-10-CM

## 2016-09-18 DIAGNOSIS — R1011 Right upper quadrant pain: Secondary | ICD-10-CM

## 2016-09-18 LAB — URINALYSIS, ROUTINE W REFLEX MICROSCOPIC
Bilirubin Urine: NEGATIVE
GLUCOSE, UA: NEGATIVE mg/dL
Hgb urine dipstick: NEGATIVE
Ketones, ur: NEGATIVE mg/dL
LEUKOCYTES UA: NEGATIVE
Nitrite: NEGATIVE
PH: 6 (ref 5.0–8.0)
PROTEIN: NEGATIVE mg/dL
SPECIFIC GRAVITY, URINE: 1.014 (ref 1.005–1.030)

## 2016-09-18 LAB — CBC WITH DIFFERENTIAL/PLATELET
BASOS PCT: 0 %
Basophils Absolute: 0 10*3/uL (ref 0.0–0.1)
EOS ABS: 0.2 10*3/uL (ref 0.0–0.7)
Eosinophils Relative: 3 %
HEMATOCRIT: 35.7 % — AB (ref 36.0–46.0)
HEMOGLOBIN: 11.4 g/dL — AB (ref 12.0–15.0)
Lymphocytes Relative: 35 %
Lymphs Abs: 1.9 10*3/uL (ref 0.7–4.0)
MCH: 25.6 pg — ABNORMAL LOW (ref 26.0–34.0)
MCHC: 31.9 g/dL (ref 30.0–36.0)
MCV: 80.2 fL (ref 78.0–100.0)
Monocytes Absolute: 0.3 10*3/uL (ref 0.1–1.0)
Monocytes Relative: 5 %
NEUTROS ABS: 3.1 10*3/uL (ref 1.7–7.7)
NEUTROS PCT: 57 %
Platelets: 281 10*3/uL (ref 150–400)
RBC: 4.45 MIL/uL (ref 3.87–5.11)
RDW: 13.6 % (ref 11.5–15.5)
WBC: 5.5 10*3/uL (ref 4.0–10.5)

## 2016-09-18 LAB — COMPREHENSIVE METABOLIC PANEL
ALBUMIN: 4.1 g/dL (ref 3.5–5.0)
ALK PHOS: 122 U/L (ref 38–126)
ALT: 18 U/L (ref 14–54)
AST: 21 U/L (ref 15–41)
Anion gap: 6 (ref 5–15)
BUN: 22 mg/dL — AB (ref 6–20)
CALCIUM: 8.9 mg/dL (ref 8.9–10.3)
CO2: 26 mmol/L (ref 22–32)
CREATININE: 0.76 mg/dL (ref 0.44–1.00)
Chloride: 108 mmol/L (ref 101–111)
GFR calc Af Amer: >60 mL/min (ref >60)
GFR calc non Af Amer: >60 mL/min (ref >60)
GLUCOSE: 105 mg/dL — AB (ref 65–99)
Potassium: 4.2 mmol/L (ref 3.5–5.1)
SODIUM: 140 mmol/L (ref 135–145)
Total Bilirubin: 0.6 mg/dL (ref 0.3–1.2)
Total Protein: 7 g/dL (ref 6.5–8.1)

## 2016-09-18 LAB — I-STAT BETA HCG BLOOD, ED (MC, WL, AP ONLY): I-stat hCG, quantitative: <5 m[IU]/mL (ref <5)

## 2016-09-18 LAB — I-STAT CG4 LACTIC ACID, ED: Lactic Acid, Venous: 1.29 mmol/L (ref 0.5–1.9)

## 2016-09-18 LAB — MAGNESIUM: Magnesium: 2.1 mg/dL (ref 1.7–2.4)

## 2016-09-18 LAB — LIPASE, BLOOD: Lipase: 24 U/L (ref 11–51)

## 2016-09-18 MED ORDER — SODIUM CHLORIDE 0.9 % IV BOLUS (SEPSIS)
1000.0000 mL | Freq: Once | INTRAVENOUS | Status: AC
  Administered 2016-09-18: 1000 mL via INTRAVENOUS

## 2016-09-18 MED ORDER — METOCLOPRAMIDE HCL 10 MG PO TABS: 10.0000 mg | ORAL_TABLET | Freq: Three times a day (TID) | ORAL | 0 refills | Status: DC | PRN

## 2016-09-18 MED ORDER — LORAZEPAM 2 MG/ML IJ SOLN: 1.0000 mg | Freq: Once | INTRAMUSCULAR | Status: DC

## 2016-09-18 MED ORDER — METOCLOPRAMIDE HCL 5 MG/ML IJ SOLN
10.0000 mg | Freq: Once | INTRAMUSCULAR | Status: AC
  Administered 2016-09-18: 10 mg via INTRAVENOUS
  Filled 2016-09-18: qty 2

## 2016-09-18 MED ORDER — DIPHENHYDRAMINE HCL 50 MG/ML IJ SOLN
25.0000 mg | Freq: Once | INTRAMUSCULAR | Status: AC
  Administered 2016-09-18: 25 mg via INTRAVENOUS
  Filled 2016-09-18: qty 1

## 2016-09-18 MED ORDER — DIPHENHYDRAMINE HCL 25 MG PO CAPS
25.0000 mg | ORAL_CAPSULE | Freq: Once | ORAL | Status: AC
  Administered 2016-09-18: 25 mg via ORAL
  Filled 2016-09-18: qty 1

## 2016-09-18 NOTE — ED Notes (Signed)
Pt was aware that a urine sample was needed but didn't get one when she went to the restroom

## 2016-09-18 NOTE — ED Notes (Signed)
Patient requested IV to be removed after administering medications.  Explained to patient she may need IV medication later.  She insisted on IV being removed.

## 2016-09-18 NOTE — ED Triage Notes (Addendum)
Per EMS. Pt reports N/V this am after breakfast. No emesis noted with EMS, only spat up clear sputum. Pt told EMS she saw blood in her sputum at one point. No abd pain. Went I spoke with the pt, she began to complain of lightheadedness since this am.

## 2016-09-18 NOTE — ED Provider Notes (Signed)
[facility] DEPT Provider Note   CSN: [id number] Arrival date & time: 09/18/16  [id number]     History   Chief Complaint Chief Complaint  Patient presents with  . Emesis    HPI [name] is a 50 y.o. female.  HPI  50 year old female with history of hypertension, diabetes, anxiety, and major depressive disorder here with nausea and vomiting. Patient states she was well last night. She had a normal dinner and was able to sleep at night. Upon awakening this morning, she developed severe nausea as well as intermittent epigastric and right upper quadrant abdominal pain. She attempted to eat a biscuit then began vomiting. She had some mild blood-tinged emesis but denies any overt hematemesis. She endorses persistent nausea but states her pain has largely improved, though she still has intermittent right upper quadrant pain. Denies any recent cough. Denies any fevers or chills. Denies any dysuria. Pain is made worse with eating as well as palpation. Denies any alleviating factors. She has no known history of gallstones.   Past Medical History:  Diagnosis Date  . Anxiety   . Diabetes mellitus without complication ([place])   . Hypertension   . Major depressive disorder, recurrent, severe with psychotic features [facility])     Patient Active Problem List   Diagnosis Date Noted  . Anxiety 03/29/2016  . MDD (major depressive disorder), recurrent severe, without psychosis ([place]) 11/18/2015  . Homelessness 11/18/2015  . Tobacco use disorder 07/05/2015  . Malnutrition of moderate degree ([place]) 05/31/2015  . Hypokalemia 05/30/2015  . Obstipation 05/30/2015  . Prolonged QT interval 05/30/2015  . Hypertension 05/30/2015  . 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 Suicidal ideations 03/12/2015  . Diabetes ([place]) 09/06/2012  . Delusional disorder ([place]) 04/16/2012    History reviewed. No pertinent surgical history.  OB History    No data available        Home Medications    Prior to Admission medications   Medication Sig Start Date End Date Taking? Authorizing Provider  metoCLOPramide (REGLAN) 10 MG tablet Take 1 tablet (10 mg total) by mouth every 8 (eight) hours as needed for nausea or vomiting. 09/18/16   Duffy Bruce, MD    Family History Family History  Problem Relation Age of Onset  . Hypertension Mother   . Diabetes Mother   . Aneurysm Mother   . Hypertension Father   . Diabetes Father   . Hypertension Sister   . Diabetes Sister   . Hypertension Other   . Diabetes Other     Social History Social History  Substance Use Topics  . Smoking status: Never Smoker  . Smokeless tobacco: Never Used  . Alcohol use No     Allergies   Patient has no known allergies.   Review of Systems Review of Systems  Constitutional: Positive for fatigue. Negative for chills and fever.  HENT: Negative for congestion, rhinorrhea and sore throat.   Eyes: Negative for visual disturbance.  Respiratory: Negative for cough, shortness of breath and wheezing.   Cardiovascular: Negative for chest pain and leg swelling.  Gastrointestinal: Positive for abdominal pain, nausea and vomiting.  Genitourinary: Negative for dysuria, flank pain, vaginal bleeding and vaginal discharge.  Musculoskeletal: Negative for neck pain.  Skin: Negative for rash.  Allergic/Immunologic: Negative for immunocompromised state.  Neurological: Positive for light-headedness. Negative for syncope and headaches.  Hematological: Does not bruise/bleed easily.  All other systems reviewed and are negative.    Physical Exam Updated Vital Signs  BP 131/83 (BP Location: Right Arm)   Pulse 73   Temp 98.3 F (36.8 C)   Resp 18   LMP 12/25/2009   SpO2 100%   Physical Exam  Constitutional: She is oriented to person, place, and time. She appears well-developed and well-nourished. No distress.  HENT:  Head: Normocephalic and atraumatic.  Eyes: Conjunctivae are  normal.  Neck: Neck supple.  Cardiovascular: Normal rate, regular rhythm and normal heart sounds.  Exam reveals no friction rub.   No murmur heard. Pulmonary/Chest: Effort normal and breath sounds normal. No respiratory distress. She has no wheezes. She has no rales.  Abdominal: Normal appearance and bowel sounds are normal. She exhibits no distension. There is no hepatosplenomegaly. There is tenderness in the right upper quadrant and epigastric area. There is no rigidity, no rebound, no guarding, no tenderness at McBurney's point and negative Murphy's sign.  Musculoskeletal: She exhibits no edema.  Neurological: She is alert and oriented to person, place, and time. She exhibits normal muscle tone.  Skin: Skin is warm. Capillary refill takes less than 2 seconds.  Psychiatric: She has a normal mood and affect.  Nursing note and vitals reviewed.    ED Treatments / Results  Labs (all labs ordered are listed, but only abnormal results are displayed) Labs Reviewed  CBC WITH DIFFERENTIAL/PLATELET - Abnormal; Notable for the following:       Result Value   Hemoglobin 11.4 (*)    HCT 35.7 (*)    MCH 25.6 (*)    All other components within normal limits  COMPREHENSIVE METABOLIC PANEL - Abnormal; Notable for the following:    Glucose, Bld 105 (*)    BUN 22 (*)    All other components within normal limits  URINALYSIS, ROUTINE W REFLEX MICROSCOPIC (NOT AT [facility])  LIPASE, BLOOD  MAGNESIUM  I-STAT CG4 LACTIC ACID, ED  I-STAT BETA HCG BLOOD, ED ([facility], [facility], [facility] ONLY)    EKG  EKG Interpretation  Date/Time:  Thursday September 18 2016 09:37:45 EST Ventricular Rate:  60 PR Interval:    QRS Duration: 99 QT Interval:  426 QTC Calculation: 426 R Axis:   38 Text Interpretation:  Sinus rhythm Low voltage, precordial leads No significant change since last tracing Confirmed by Brendolyn Stockley MD, Lysbeth Galas [phone]) on 09/18/2016 9:44:51 AM Also confirmed by Ellender Hose MD, Lennell Shanks [phone]), editor [address] [place], [place]  [phone])  on 09/18/2016 10:29:54 AM       Radiology US Abdomen Limited Ruq  Result Date: 09/18/2016 CLINICAL DATA:  Right upper quadrant pain. EXAM: US ABDOMEN LIMITED - RIGHT UPPER QUADRANT COMPARISON:  None. FINDINGS: Gallbladder: No stones, sludge, Murphy's sign, or pericholecystic fluid. The gallbladder wall measures measures 3 mm which is at the upper limits of normal. Common bile duct: Diameter: 3.2 mm Liver: Probable hepatic steatosis. IMPRESSION: 1. No stones, sludge, Murphy's sign, or pericholecystic fluid. The gallbladder wall measures 3 mm which is at the upper limits of normal. No abnormalities identified. 2. Probable hepatic steatosis. Electronically Signed   By: Dorise Bullion III M.D   On: 09/18/2016 12:59    Procedures Procedures (including critical care time)  Medications Ordered in ED Medications  sodium chloride 0.9 % bolus 1,000 mL (1,000 mLs Intravenous Refused 09/18/16 1009)  metoCLOPramide (REGLAN) injection 10 mg (10 mg Intravenous Given 09/18/16 0959)  diphenhydrAMINE (BENADRYL) injection 25 mg (25 mg Intravenous Given 09/18/16 0959)  diphenhydrAMINE (BENADRYL) capsule 25 mg (25 mg Oral Given 09/18/16 1045)     Initial Impression / Assessment and  Plan / ED Course  I have reviewed the triage vital signs and the nursing notes.  Pertinent labs & imaging results that were available during my care of the patient were reviewed by me and considered in my medical decision making (see chart for details).  Clinical Course     50 yo F with PMHx as above here with mild nausea, epigastric pain, and vomiting. VSS and WNL. Exam overall reassuring, with no rebound or guarding. Initial DDX: gastritis, GERD, gastroparesis, cholecystitis, pancreatitis. No signs of obstruction or peritonitis. Will check labs, RUQ U/S, and give sx control. Otherwise, pt well appearing and in NAD. Denies sick contacts, though viral GI illness also on DDx, as well as food-borne illness. IVF  given.  Labs are overall reassuring. Normal WBC, normal LFTs and Bili. RUQ U/S negative. Lipase normal. UA without UTI. Tolerating PO and sx improved. Suspect gastritis versus GERD versus viral GI illness. Will d/c with reglan, d/c home.  Final Clinical Impressions(s) / ED Diagnoses   Final diagnoses:  RUQ pain  Non-intractable vomiting with nausea, unspecified vomiting type  Epigastric pain    New Prescriptions New Prescriptions   METOCLOPRAMIDE (REGLAN) 10 MG TABLET    Take 1 tablet (10 mg total) by mouth every 8 (eight) hours as needed for nausea or vomiting.     Duffy Bruce, MD 09/18/16 1332

## 2016-09-18 NOTE — ED Notes (Signed)
Bed: WA03 Expected date:  Expected time:  Means of arrival:  Comments: EMS- 50yo F, nausea/spitting up blood?

## 2016-09-18 NOTE — ED Notes (Signed)
Requested patient to give [name] urine sample while in restroom, she refused at the moment.

## 2016-09-18 NOTE — ED Notes (Signed)
US at bedside

## 2016-09-20 DIAGNOSIS — E119 Type 2 diabetes mellitus without complications: Secondary | ICD-10-CM | POA: Insufficient documentation

## 2016-09-20 DIAGNOSIS — I10 Essential (primary) hypertension: Secondary | ICD-10-CM | POA: Insufficient documentation

## 2016-09-20 DIAGNOSIS — Z765 Malingerer [conscious simulation]: Secondary | ICD-10-CM | POA: Insufficient documentation

## 2016-09-20 DIAGNOSIS — Z590 Homelessness: Secondary | ICD-10-CM | POA: Insufficient documentation

## 2016-09-20 LAB — BASIC METABOLIC PANEL
ANION GAP: 7 (ref 5–15)
BUN: 22 mg/dL — ABNORMAL HIGH (ref 6–20)
CALCIUM: 9.1 mg/dL (ref 8.9–10.3)
CHLORIDE: 109 mmol/L (ref 101–111)
CO2: 23 mmol/L (ref 22–32)
Creatinine, Ser: 0.79 mg/dL (ref 0.44–1.00)
GFR calc non Af Amer: >60 mL/min (ref >60)
GLUCOSE: 149 mg/dL — AB (ref 65–99)
POTASSIUM: 3.7 mmol/L (ref 3.5–5.1)
Sodium: 139 mmol/L (ref 135–145)

## 2016-09-20 LAB — CBC
HEMATOCRIT: 35.2 % — AB (ref 36.0–46.0)
HEMOGLOBIN: 11.2 g/dL — AB (ref 12.0–15.0)
MCH: 25.5 pg — AB (ref 26.0–34.0)
MCHC: 31.8 g/dL (ref 30.0–36.0)
MCV: 80 fL (ref 78.0–100.0)
Platelets: 289 10*3/uL (ref 150–400)
RBC: 4.4 MIL/uL (ref 3.87–5.11)
RDW: 13.7 % (ref 11.5–15.5)
WBC: 6.5 10*3/uL (ref 4.0–10.5)

## 2016-09-20 LAB — CBG MONITORING, ED: GLUCOSE-CAPILLARY: 143 mg/dL — AB (ref 65–99)

## 2016-09-20 NOTE — ED Notes (Signed)
EKG given to EDP,[place],.MD., for review. 

## 2016-09-20 NOTE — ED Triage Notes (Signed)
Pt c/o weakness x3 days. Per EMS pt is not allowed in the sleeping quarters where she is staying d/t behavioral disturbances. Pt seen here yesterday. A&Ox4.

## 2016-09-21 ENCOUNTER — Emergency Department (HOSPITAL_COMMUNITY)
Admission: EM | Admit: 2016-09-21 | Discharge: 2016-09-21 | Disposition: A | Discharge status: Home / Self Care | Payer: Self-pay | Attending: Emergency Medicine | Admitting: Emergency Medicine

## 2016-09-21 DIAGNOSIS — Z5900 Homelessness unspecified: Secondary | ICD-10-CM

## 2016-09-21 DIAGNOSIS — Z765 Malingerer [conscious simulation]: Secondary | ICD-10-CM

## 2016-09-21 LAB — URINALYSIS, ROUTINE W REFLEX MICROSCOPIC
Bilirubin Urine: NEGATIVE
Glucose, UA: NEGATIVE mg/dL
HGB URINE DIPSTICK: NEGATIVE
Ketones, ur: NEGATIVE mg/dL
Leukocytes, UA: NEGATIVE
Nitrite: NEGATIVE
Protein, ur: NEGATIVE mg/dL
SPECIFIC GRAVITY, URINE: 1.034 — AB (ref 1.005–1.030)
pH: 5 (ref 5.0–8.0)

## 2016-09-21 NOTE — ED Notes (Signed)
Dr discharged patient and patient is being reluctant to leave.

## 2016-09-21 NOTE — ED Provider Notes (Signed)
[facility] DEPT Provider Note   CSN: [id number] Arrival date & time: 09/20/16  2153   By signing my name below, I, Delton Prairie, attest that this documentation has been prepared under the direction and in the presence of  Junius Creamer, NP. Electronically Signed: [place]ton [place], ED Scribe. 09/21/16. 2:17 AM.   History   Chief Complaint Chief Complaint  Patient presents with  . Weakness   The history is provided by the patient. No language interpreter was used.   HPI Comments:  [name] is a 50 y.o. female who presents to the Emergency Department, via EMS, complaining of persistent dizziness x 2 days. Pt notes associated weakness, sore throat and coughs. She notes she is currently living in a shelter and is not able to sleep in the sleeping quarters because she has been spitting up blood for 3 days. Pt states she regularly eats at the shelter. She denies vomiting, diarrhea, rhinorrhea, difficulty urinating, any other associated symptoms and modifying factors at this time.    Past Medical History:  Diagnosis Date  . Anxiety   . Diabetes mellitus without complication ([place])   . Hypertension   . Major depressive disorder, recurrent, severe with psychotic features [facility])     Patient Active Problem List   Diagnosis Date Noted  . Anxiety 03/29/2016  . MDD (major depressive disorder), recurrent severe, without psychosis ([place]) 11/18/2015  . Homelessness 11/18/2015  . Tobacco use disorder 07/05/2015  . Malnutrition of moderate degree ([place]) 05/31/2015  . Hypokalemia 05/30/2015  . Obstipation 05/30/2015  . Prolonged QT interval 05/30/2015  . Hypertension 05/30/2015  . 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 Suicidal ideations 03/12/2015  . Diabetes ([place]) 09/06/2012  . Delusional disorder ([place]) 04/16/2012    No past surgical history on file.  OB History    No data available       Home Medications    Prior to Admission medications    Medication Sig Start Date End Date Taking? Authorizing Provider  metoCLOPramide (REGLAN) 10 MG tablet Take 1 tablet (10 mg total) by mouth every 8 (eight) hours as needed for nausea or vomiting. 09/18/16   Duffy Bruce, MD    Family History Family History  Problem Relation Age of Onset  . Hypertension Mother   . Diabetes Mother   . Aneurysm Mother   . Hypertension Father   . Diabetes Father   . Hypertension Sister   . Diabetes Sister   . Hypertension Other   . Diabetes Other     Social History Social History  Substance Use Topics  . Smoking status: Never Smoker  . Smokeless tobacco: Never Used  . Alcohol use No     Allergies   Patient has no known allergies.   Review of Systems Review of Systems  Constitutional: Negative for fever.  HENT: Positive for sore throat. Negative for rhinorrhea.   Respiratory: Positive for cough.   Gastrointestinal: Negative for diarrhea and vomiting.  Genitourinary: Negative for difficulty urinating.  Neurological: Positive for dizziness and weakness.  All other systems reviewed and are negative.  Physical Exam Updated Vital Signs BP (!) 105/44 (BP Location: Right Arm)   Pulse 66   Temp 98.2 F (36.8 C) (Oral)   Resp 17   LMP 12/25/2009   SpO2 98%   Physical Exam  Constitutional: She is oriented to person, place, and time. She appears well-developed and well-nourished. No distress.  HENT:  Head: Normocephalic and atraumatic.  Eyes: Conjunctivae are  normal.  Cardiovascular: Normal rate.   Pulmonary/Chest: Effort normal.  Abdominal: She exhibits no distension.  Neurological: She is alert and oriented to person, place, and time.  Skin: Skin is warm and dry.  Psychiatric: She has a normal mood and affect.  Nursing note and vitals reviewed.    ED Treatments / Results  DIAGNOSTIC STUDIES:  Oxygen Saturation is 100% on RA, normal by my interpretation.    COORDINATION OF CARE:  2:16 AM Discussed treatment plan with pt  at bedside and pt agreed to plan.  Labs (all labs ordered are listed, but only abnormal results are displayed) Labs Reviewed  BASIC METABOLIC PANEL - Abnormal; Notable for the following:       Result Value   Glucose, Bld 149 (*)    BUN 22 (*)    All other components within normal limits  CBC - Abnormal; Notable for the following:    Hemoglobin 11.2 (*)    HCT 35.2 (*)    MCH 25.5 (*)    All other components within normal limits  URINALYSIS, ROUTINE W REFLEX MICROSCOPIC (NOT AT [facility]) - Abnormal; Notable for the following:    Specific Gravity, Urine 1.034 (*)    All other components within normal limits  CBG MONITORING, ED - Abnormal; Notable for the following:    Glucose-Capillary 143 (*)    All other components within normal limits    EKG  EKG Interpretation None       Radiology No results found.  Procedures Procedures (including critical care time)  Medications Ordered in ED Medications - No data to display   Initial Impression / Assessment and Plan / ED Course  I have reviewed the triage vital signs and the nursing notes.  Pertinent labs & imaging results that were available during my care of the patient were reviewed by me and considered in my medical decision making (see chart for details).  Clinical Course     Labs and urine, reviewed all within normal parameters.  Patient appears to be very sleepy.  She wakes easily She states she has not been allowed to sleep at [facility] because she keeps spitting and coughing During my examination and related stay in the emergency room.  She is noted to cough   Final Clinical Impressions(s) / ED Diagnoses   Final diagnoses:  Malingering  Homelessness    New Prescriptions New Prescriptions   No medications on file   I personally performed the services described in this documentation, which was scribed in my presence. The recorded information has been reviewed and is accurate.    Junius Creamer, NP 09/21/16  [place], DO 09/21/16 [place], DO 09/21/16 [id number]

## 2016-09-21 NOTE — ED Notes (Signed)
Patient requested and received water to drink.

## 2016-09-21 NOTE — ED Notes (Signed)
Patient states she is sleepy and thirsty.  She stays at the shelter, but they will not allow her to stay there (for an unknown reason).  Patient denies pain, but states she is thirsty.

## 2016-09-21 NOTE — ED Notes (Signed)
Patient was being discharged and Patient starting saying she is having anxiety and tired. Patient requested for MD. MD notified.

## 2016-11-04 ENCOUNTER — Emergency Department (HOSPITAL_COMMUNITY): Admission: EM | Admit: 2016-11-04 | Discharge: 2016-11-04 | Discharge status: Against Medical Advice | Payer: Self-pay

## 2016-11-04 NOTE — ED Triage Notes (Signed)
Before pt is triaged, pt came to the desk, states she feels much better and is going to feel much better at home.  She states she no longer feels dizzy and is much better

## 2016-11-04 NOTE — ED Notes (Signed)
Bed: [id number] Expected date:  Expected time:  Means of arrival:  Comments: EMS psychiatric problem

## 2016-11-06 ENCOUNTER — Encounter (HOSPITAL_COMMUNITY): Payer: Self-pay | Admitting: Emergency Medicine

## 2016-11-06 ENCOUNTER — Emergency Department (HOSPITAL_COMMUNITY)
Admission: EM | Admit: 2016-11-06 | Discharge: 2016-11-07 | Disposition: A | Discharge status: Home / Self Care | Payer: Self-pay | Attending: Emergency Medicine | Admitting: Emergency Medicine

## 2016-11-06 DIAGNOSIS — Z5321 Procedure and treatment not carried out due to patient leaving prior to being seen by health care provider: Secondary | ICD-10-CM | POA: Insufficient documentation

## 2016-11-06 DIAGNOSIS — R1030 Lower abdominal pain, unspecified: Secondary | ICD-10-CM | POA: Insufficient documentation

## 2016-11-06 NOTE — ED Triage Notes (Signed)
Upon examination patient states she is having lower abdominal pain that she wants checked out.  States she has had some flu like symptoms but really is here because of her abdominal pain.  Pt becomes spaced out often and closes her eyes often.  Denies SI/HI but does have psych history.

## 2016-11-06 NOTE — ED Triage Notes (Signed)
Pt comes from home via EMS with complaints of flu-like symptoms.  She was seen 2 days ago for the same but left before being triaged.  Patient coughing on arrival but not in route. In route, BP 144/87, HR 68. Ambulatory.

## 2016-11-15 ENCOUNTER — Emergency Department (HOSPITAL_COMMUNITY)
Admission: EM | Admit: 2016-11-15 | Discharge: 2016-11-15 | Disposition: A | Discharge status: Home / Self Care | Payer: Self-pay | Attending: Dermatology | Admitting: Dermatology

## 2016-11-15 DIAGNOSIS — R42 Dizziness and giddiness: Secondary | ICD-10-CM | POA: Insufficient documentation

## 2016-11-15 DIAGNOSIS — Z5321 Procedure and treatment not carried out due to patient leaving prior to being seen by health care provider: Secondary | ICD-10-CM | POA: Insufficient documentation

## 2016-11-15 NOTE — ED Triage Notes (Signed)
Patient got up and stated, "I am going Home."

## 2016-11-15 NOTE — ED Triage Notes (Signed)
Patient states she is having dizziness and weakness. Patient then states, "I am going to go home." If something happens, I will call EMS- Patient states, "I have been up here several times and I do not have time to sit up here and wait." Patient encouraged to stay.

## 2016-11-15 NOTE — ED Triage Notes (Signed)
Per EMS- Patient stated she was having a stroke and heart attack, but refused to answer EMS questions regarding symptoms and history.

## 2016-11-15 NOTE — ED Notes (Signed)
Pataient left the triage area and walked through the doors to the Lobby.

## 2017-01-24 ENCOUNTER — Encounter (HOSPITAL_COMMUNITY): Payer: Self-pay

## 2017-01-24 ENCOUNTER — Emergency Department (HOSPITAL_COMMUNITY)
Admission: EM | Admit: 2017-01-24 | Discharge: 2017-01-24 | Disposition: A | Discharge status: Home / Self Care | Payer: Self-pay | Attending: Emergency Medicine | Admitting: Emergency Medicine

## 2017-01-24 DIAGNOSIS — E119 Type 2 diabetes mellitus without complications: Secondary | ICD-10-CM | POA: Insufficient documentation

## 2017-01-24 DIAGNOSIS — I10 Essential (primary) hypertension: Secondary | ICD-10-CM | POA: Insufficient documentation

## 2017-01-24 DIAGNOSIS — Z5181 Encounter for therapeutic drug level monitoring: Secondary | ICD-10-CM | POA: Insufficient documentation

## 2017-01-24 DIAGNOSIS — F419 Anxiety disorder, unspecified: Secondary | ICD-10-CM | POA: Insufficient documentation

## 2017-01-24 LAB — RAPID URINE DRUG SCREEN, HOSP PERFORMED
Amphetamines: NOT DETECTED
BARBITURATES: NOT DETECTED
Benzodiazepines: NOT DETECTED
COCAINE: NOT DETECTED
OPIATES: NOT DETECTED
TETRAHYDROCANNABINOL: NOT DETECTED

## 2017-01-24 LAB — URINALYSIS, ROUTINE W REFLEX MICROSCOPIC
BILIRUBIN URINE: NEGATIVE
Glucose, UA: NEGATIVE mg/dL
Hgb urine dipstick: NEGATIVE
KETONES UR: NEGATIVE mg/dL
LEUKOCYTES UA: NEGATIVE
Nitrite: NEGATIVE
PH: 5 (ref 5.0–8.0)
Protein, ur: NEGATIVE mg/dL
Specific Gravity, Urine: 1.008 (ref 1.005–1.030)

## 2017-01-24 LAB — CBC WITH DIFFERENTIAL/PLATELET
BASOS ABS: 0 10*3/uL (ref 0.0–0.1)
BASOS PCT: 0 %
EOS ABS: 0.2 10*3/uL (ref 0.0–0.7)
EOS PCT: 3 %
HCT: 34.5 % — ABNORMAL LOW (ref 36.0–46.0)
Hemoglobin: 11 g/dL — ABNORMAL LOW (ref 12.0–15.0)
Lymphocytes Relative: 33 %
Lymphs Abs: 1.9 10*3/uL (ref 0.7–4.0)
MCH: 25.6 pg — ABNORMAL LOW (ref 26.0–34.0)
MCHC: 31.9 g/dL (ref 30.0–36.0)
MCV: 80.2 fL (ref 78.0–100.0)
MONO ABS: 0.2 10*3/uL (ref 0.1–1.0)
Monocytes Relative: 4 %
Neutro Abs: 3.4 10*3/uL (ref 1.7–7.7)
Neutrophils Relative %: 60 %
PLATELETS: 260 10*3/uL (ref 150–400)
RBC: 4.3 MIL/uL (ref 3.87–5.11)
RDW: 13.9 % (ref 11.5–15.5)
WBC: 5.6 10*3/uL (ref 4.0–10.5)

## 2017-01-24 LAB — COMPREHENSIVE METABOLIC PANEL
ALBUMIN: 4.1 g/dL (ref 3.5–5.0)
ALK PHOS: 130 U/L — AB (ref 38–126)
ALT: 21 U/L (ref 14–54)
AST: 20 U/L (ref 15–41)
Anion gap: 6 (ref 5–15)
BILIRUBIN TOTAL: 0.4 mg/dL (ref 0.3–1.2)
BUN: 24 mg/dL — AB (ref 6–20)
CO2: 25 mmol/L (ref 22–32)
CREATININE: 0.89 mg/dL (ref 0.44–1.00)
Calcium: 9.3 mg/dL (ref 8.9–10.3)
Chloride: 108 mmol/L (ref 101–111)
GFR calc Af Amer: >60 mL/min (ref >60)
GLUCOSE: 124 mg/dL — AB (ref 65–99)
Potassium: 4.1 mmol/L (ref 3.5–5.1)
Sodium: 139 mmol/L (ref 135–145)
TOTAL PROTEIN: 7.8 g/dL (ref 6.5–8.1)

## 2017-01-24 LAB — ACETAMINOPHEN LEVEL

## 2017-01-24 LAB — SALICYLATE LEVEL

## 2017-01-24 LAB — POC URINE PREG, ED: Preg Test, Ur: NEGATIVE

## 2017-01-24 LAB — ETHANOL: Alcohol, Ethyl (B): <5 mg/dL (ref <5)

## 2017-01-24 MED ORDER — LORAZEPAM 1 MG PO TABS: 1.0000 mg | ORAL_TABLET | Freq: Three times a day (TID) | ORAL | Status: DC | PRN

## 2017-01-24 MED ORDER — LORAZEPAM 1 MG PO TABS
2.0000 mg | ORAL_TABLET | Freq: Once | ORAL | Status: AC
  Administered 2017-01-24: 2 mg via ORAL
  Filled 2017-01-24: qty 2

## 2017-01-24 MED ORDER — HYDROXYZINE HCL 25 MG PO TABS: 25.0000 mg | ORAL_TABLET | Freq: Three times a day (TID) | ORAL | 0 refills | Status: DC | PRN

## 2017-01-24 NOTE — ED Notes (Signed)
Per EMS- pt having anxiety since midnight tonight. Reporting Shortness of Breath with clear lung sounds, Normal 02 saturations.

## 2017-01-24 NOTE — ED Provider Notes (Signed)
[facility] DEPT Provider Note   CSN: [id number] Arrival date & time: 01/24/17  0234  By signing my name below, I, Dolores Hoose, attest that this documentation has been prepared under the direction and in the presence of Ripley Fraise, MD . Electronically Signed: Dolores Hoose, Scribe. 01/24/2017. 3:22 AM.  History   Chief Complaint Chief Complaint  Patient presents with  . Anxiety   The history is provided by the patient. No language interpreter was used.  Anxiety  This is a new problem. The current episode started 3 to 5 hours ago. The problem occurs constantly. The problem has not changed since onset.Associated symptoms include shortness of breath. Pertinent negatives include no chest pain, no abdominal pain and no headaches. The symptoms are aggravated by stress. Nothing relieves the symptoms. She has tried rest for the symptoms. The treatment provided no relief.    HPI Comments:  [name] is a 51 y.o. female who presents to the Emergency Department complaining of sudden-onset, constant anxiety attack which began ~3 hours ago. Pt reports associated vomiting, nausea and SOB. She notes that she started feeling sick to her stomach at home and this triggered her anxiety attack. Pt has tried some deep breathing and resting with no relief. She denies any fever, headache, CP, or abdominal pain.  Pt secondarily reports thoughts of wanting to hurt other people. She states she has been under a lot more stress lately but denies having any specific plan. When asked about potential suicidal ideation, the pt does not directly respond.   Past Medical History:  Diagnosis Date  . Anxiety   . Diabetes mellitus without complication ([place])   . Hypertension   . Major depressive disorder, recurrent, severe with psychotic features [facility])     Patient Active Problem List   Diagnosis Date Noted  . Anxiety 03/29/2016  . MDD (major depressive disorder), recurrent severe, without psychosis ([place])  11/18/2015  . Homelessness 11/18/2015  . Tobacco use disorder 07/05/2015  . Malnutrition of moderate degree ([place]) 05/31/2015  . Hypokalemia 05/30/2015  . Obstipation 05/30/2015  . Prolonged QT interval 05/30/2015  . Hypertension 05/30/2015  . 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 Suicidal ideations 03/12/2015  . Diabetes ([place]) 09/06/2012  . Delusional disorder ([place]) 04/16/2012    History reviewed. No pertinent surgical history.  OB History    No data available       Home Medications    Prior to Admission medications   Medication Sig Start Date End Date Taking? Authorizing Provider  metoCLOPramide (REGLAN) 10 MG tablet Take 1 tablet (10 mg total) by mouth every 8 (eight) hours as needed for nausea or vomiting. Patient not taking: Reported on 01/24/2017 09/18/16   Duffy Bruce, MD    Family History Family History  Problem Relation Age of Onset  . Hypertension Mother   . Diabetes Mother   . Aneurysm Mother   . Hypertension Father   . Diabetes Father   . Hypertension Sister   . Diabetes Sister   . Hypertension Other   . Diabetes Other     Social History Social History  Substance Use Topics  . Smoking status: Never Smoker  . Smokeless tobacco: Never Used  . Alcohol use No     Allergies   Patient has no known allergies.   Review of Systems Review of Systems  Constitutional: Negative for fever.  Respiratory: Positive for shortness of breath.   Cardiovascular: Negative for chest pain.  Gastrointestinal: Positive for  nausea and vomiting. Negative for abdominal pain.  Neurological: Negative for headaches.  Psychiatric/Behavioral: The patient is nervous/anxious.        Positive for Homicidal Ideation  All other systems reviewed and are negative.    Physical Exam Updated Vital Signs BP 113/90 (BP Location: Right Arm)   Pulse 65   Temp 98.6 F (37 C) (Oral)   Resp 20   LMP 10/29/2011   SpO2 100%   Physical  Exam CONSTITUTIONAL: Anxious and agitated HEAD: Normocephalic/atraumatic EYES: EOMI/PERRL ENMT: Mucous membranes dry NECK: supple no meningeal signs SPINE/BACK:entire spine nontender CV: S1/S2 noted, no murmurs/rubs/gallops noted LUNGS: Lungs are clear to auscultation bilaterally, no apparent distress ABDOMEN: soft, nontender, no rebound or guarding, bowel sounds noted throughout abdomen GU:no cva tenderness NEURO: Pt is awake/alert/appropriate, moves all extremitiesx4.  No facial droop.   EXTREMITIES: pulses normal/equal, full ROM SKIN: warm, color normal PSYCH: anxious, poor eye contact   ED Treatments / Results  DIAGNOSTIC STUDIES:  Oxygen Saturation is 100% on RA, normal by my interpretation.    COORDINATION OF CARE:  3:28 AM Discussed treatment plan with pt at bedside which includes blood work and pt agreed to plan.  Labs (all labs ordered are listed, but only abnormal results are displayed) Labs Reviewed  COMPREHENSIVE METABOLIC PANEL - Abnormal; Notable for the following:       Result Value   Glucose, Bld 124 (*)    BUN 24 (*)    Alkaline Phosphatase 130 (*)    All other components within normal limits  CBC WITH DIFFERENTIAL/PLATELET - Abnormal; Notable for the following:    Hemoglobin 11.0 (*)    HCT 34.5 (*)    MCH 25.6 (*)    All other components within normal limits  ACETAMINOPHEN LEVEL - Abnormal; Notable for the following:    Acetaminophen (Tylenol), Serum <10 (*)    All other components within normal limits  URINALYSIS, ROUTINE W REFLEX MICROSCOPIC - Abnormal; Notable for the following:    Color, Urine STRAW (*)    All other components within normal limits  SALICYLATE LEVEL  ETHANOL  RAPID URINE DRUG SCREEN, HOSP PERFORMED  POC URINE PREG, ED    EKG  EKG Interpretation  Date/Time:  Saturday January 24 2017 04:12:11 EDT Ventricular Rate:  63 PR Interval:    QRS Duration: 92 QT Interval:  421 QTC Calculation: 431 R Axis:   39 Text  Interpretation:  Sinus rhythm Nonspecific ST abnormality No significant change since last tracing Confirmed by Christy Gentles  MD, Vadie Principato ([id number]) on 01/24/2017 4:34:20 AM       Radiology No results found.  Procedures Procedures (including critical care time)  Medications Ordered in ED Medications  LORazepam (ATIVAN) tablet 1 mg (not administered)  LORazepam (ATIVAN) tablet 2 mg (2 mg Oral Given 01/24/17 0406)     Initial Impression / Assessment and Plan / ED Course  I have reviewed the triage vital signs and the nursing notes.  Pertinent labs & imaging results that were available during my care of the patient were reviewed by me and considered in my medical decision making (see chart for details).     4:47 AM Pt now feeling suicidal Precautions instituted Will need psych consult 6:52 AM Pt had told nurse she was no longer suicidal When I re-evaluated patient she reports "I feel apprehensive" and "I'm ready to talk to someone" When asked if suicidal, she does not directly answer question  Pt in the ED with vague complaints, but overall well  appearing and improved with ativan.  She is no longer spitting in a bag However given vague history of SI and HI I am requesting psych consult  Final Clinical Impressions(s) / ED Diagnoses   Final diagnoses:  Anxiety    New Prescriptions New Prescriptions   No medications on file  I personally performed the services described in this documentation, which was scribed in my presence. The recorded information has been reviewed and is accurate.        Ripley Fraise, MD 01/24/17 [phone]

## 2017-01-24 NOTE — ED Notes (Signed)
Offer meal to PT. PT does not wish to eat at this time
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
## 2017-01-24 NOTE — ED Notes (Signed)
Attempted to dress out patient per suicide precautions.  After explaining procedure pt stated "I wish I didn't say that, I don't really feel that way.  I'm not going to hurt myself."  MD Wickline aware.

## 2017-02-24 ENCOUNTER — Encounter (HOSPITAL_COMMUNITY): Payer: Self-pay | Admitting: *Deleted

## 2017-02-24 ENCOUNTER — Emergency Department (HOSPITAL_COMMUNITY)
Admission: EM | Admit: 2017-02-24 | Discharge: 2017-02-24 | Disposition: A | Discharge status: Home / Self Care | Payer: Self-pay | Attending: Emergency Medicine | Admitting: Emergency Medicine

## 2017-02-24 DIAGNOSIS — R109 Unspecified abdominal pain: Secondary | ICD-10-CM

## 2017-02-24 DIAGNOSIS — Z79899 Other long term (current) drug therapy: Secondary | ICD-10-CM | POA: Insufficient documentation

## 2017-02-24 DIAGNOSIS — I10 Essential (primary) hypertension: Secondary | ICD-10-CM | POA: Insufficient documentation

## 2017-02-24 DIAGNOSIS — E119 Type 2 diabetes mellitus without complications: Secondary | ICD-10-CM | POA: Insufficient documentation

## 2017-02-24 DIAGNOSIS — R1084 Generalized abdominal pain: Secondary | ICD-10-CM | POA: Insufficient documentation

## 2017-02-24 LAB — CBC WITH DIFFERENTIAL/PLATELET
Basophils Absolute: 0 10*3/uL (ref 0.0–0.1)
Basophils Relative: 0 %
EOS ABS: 0.1 10*3/uL (ref 0.0–0.7)
EOS PCT: 2 %
HCT: 36.7 % (ref 36.0–46.0)
HEMOGLOBIN: 11.4 g/dL — AB (ref 12.0–15.0)
Lymphocytes Relative: 34 %
Lymphs Abs: 1.8 10*3/uL (ref 0.7–4.0)
MCH: 24.9 pg — AB (ref 26.0–34.0)
MCHC: 31.1 g/dL (ref 30.0–36.0)
MCV: 80.3 fL (ref 78.0–100.0)
MONOS PCT: 5 %
Monocytes Absolute: 0.3 10*3/uL (ref 0.1–1.0)
Neutro Abs: 3.1 10*3/uL (ref 1.7–7.7)
Neutrophils Relative %: 59 %
PLATELETS: 264 10*3/uL (ref 150–400)
RBC: 4.57 MIL/uL (ref 3.87–5.11)
RDW: 14 % (ref 11.5–15.5)
WBC: 5.2 10*3/uL (ref 4.0–10.5)

## 2017-02-24 LAB — LIPASE, BLOOD: Lipase: 21 U/L (ref 11–51)

## 2017-02-24 LAB — COMPREHENSIVE METABOLIC PANEL
ALK PHOS: 116 U/L (ref 38–126)
ALT: 17 U/L (ref 14–54)
ANION GAP: 8 (ref 5–15)
AST: 20 U/L (ref 15–41)
Albumin: 4.1 g/dL (ref 3.5–5.0)
BUN: 24 mg/dL — ABNORMAL HIGH (ref 6–20)
CALCIUM: 9 mg/dL (ref 8.9–10.3)
CO2: 22 mmol/L (ref 22–32)
CREATININE: 0.88 mg/dL (ref 0.44–1.00)
Chloride: 109 mmol/L (ref 101–111)
GLUCOSE: 96 mg/dL (ref 65–99)
Potassium: 4 mmol/L (ref 3.5–5.1)
SODIUM: 139 mmol/L (ref 135–145)
TOTAL PROTEIN: 7.5 g/dL (ref 6.5–8.1)
Total Bilirubin: 0.4 mg/dL (ref 0.3–1.2)

## 2017-02-24 LAB — URINALYSIS, ROUTINE W REFLEX MICROSCOPIC
BILIRUBIN URINE: NEGATIVE
GLUCOSE, UA: NEGATIVE mg/dL
HGB URINE DIPSTICK: NEGATIVE
Ketones, ur: NEGATIVE mg/dL
Leukocytes, UA: NEGATIVE
Nitrite: NEGATIVE
PH: 5 (ref 5.0–8.0)
Protein, ur: NEGATIVE mg/dL
SPECIFIC GRAVITY, URINE: 1.023 (ref 1.005–1.030)

## 2017-02-24 LAB — RAPID URINE DRUG SCREEN, HOSP PERFORMED
Amphetamines: NOT DETECTED
BARBITURATES: NOT DETECTED
Benzodiazepines: NOT DETECTED
Cocaine: NOT DETECTED
OPIATES: NOT DETECTED
TETRAHYDROCANNABINOL: NOT DETECTED

## 2017-02-24 LAB — PREGNANCY, URINE: Preg Test, Ur: NEGATIVE

## 2017-02-24 MED ORDER — SODIUM CHLORIDE 0.9 % IV BOLUS (SEPSIS): 1000.0000 mL | Freq: Once | INTRAVENOUS | Status: DC

## 2017-02-24 NOTE — ED Notes (Signed)
Patient refused IV. 

## 2017-02-24 NOTE — ED Notes (Signed)
Bed: IV14 Expected date:  Expected time:  Means of arrival:  Comments: TCU

## 2017-02-24 NOTE — ED Notes (Signed)
Patient was able to ambulate out of room to restroom with staff as standby. Patient was also able to use the restroom without assistance from staff. Patient is currently walking around the room without assistance and in the hallway patient is stating that the room is dirty and will not go back in until cleaned. This tech has reassured the patient that I personally cleaned the room. Patient states that the floor is dirty. Patient waiting for housekeeping to mop room.

## 2017-02-24 NOTE — ED Notes (Signed)
Went over AVS forms with patient.  She had no questions.

## 2017-02-24 NOTE — Discharge Instructions (Signed)
Tests showed no life-threatening condition.  Follow-up your primary care doctor. °

## 2017-02-24 NOTE — ED Notes (Signed)
Bed: GY17 Expected date:  Expected time:  Means of arrival:  Comments: EMS psych
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
## 2017-02-24 NOTE — ED Triage Notes (Signed)
Pt reports abd pain and states that she has filed an Education officer, museum case of a [name] and a Colletta [place] who were former co-workers.  Pt denies any issues with FBI trying to chase her.  Rates abd pain a 10 out of 10 and endorses nausea.

## 2017-02-25 NOTE — ED Provider Notes (Signed)
[facility] DEPT Provider Note   CSN: [id number] Arrival date & time: 02/24/17  1126     History   Chief Complaint Chief Complaint  Patient presents with  . Paranoid  . Abdominal Pain    HPI [name] is a 51 y.o. female.  Patient complains of generalized abdominal pain. No fever, sweats, chills, vomiting, diarrhea. Additionally she has a significant mental health history including depression, adjustment disorder, suicidal ideation, delusions.  She has been eating and drinking. No suicidal or homicidal ideation.      Past Medical History:  Diagnosis Date  . Anxiety   . Diabetes mellitus without complication ([place])   . Hypertension   . Major depressive disorder, recurrent, severe with psychotic features [facility])     Patient Active Problem List   Diagnosis Date Noted  . Anxiety 03/29/2016  . MDD (major depressive disorder), recurrent severe, without psychosis ([place]) 11/18/2015  . Homelessness 11/18/2015  . Tobacco use disorder 07/05/2015  . Malnutrition of moderate degree ([place]) 05/31/2015  . Hypokalemia 05/30/2015  . Obstipation 05/30/2015  . Prolonged QT interval 05/30/2015  . Hypertension 05/30/2015  . 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 Suicidal ideations 03/12/2015  . Diabetes ([place]) 09/06/2012  . Delusional disorder ([place]) 04/16/2012    History reviewed. No pertinent surgical history.  OB History    No data available       Home Medications    Prior to Admission medications   Medication Sig Start Date End Date Taking? Authorizing Provider  hydrOXYzine (ATARAX/VISTARIL) 25 MG tablet Take 1 tablet (25 mg total) by mouth every 8 (eight) hours as needed for anxiety. Patient not taking: Reported on 02/24/2017 01/24/17   Julianne Rice, MD  metoCLOPramide (REGLAN) 10 MG tablet Take 1 tablet (10 mg total) by mouth every 8 (eight) hours as needed for nausea or vomiting. Patient not taking: Reported on 01/24/2017  09/18/16   Duffy Bruce, MD    Family History Family History  Problem Relation Age of Onset  . Hypertension Mother   . Diabetes Mother   . Aneurysm Mother   . Hypertension Father   . Diabetes Father   . Hypertension Sister   . Diabetes Sister   . Hypertension Other   . Diabetes Other     Social History Social History  Substance Use Topics  . Smoking status: Never Smoker  . Smokeless tobacco: Never Used  . Alcohol use No     Allergies   Patient has no known allergies.   Review of Systems Review of Systems  All other systems reviewed and are negative.    Physical Exam Updated Vital Signs BP 126/66 (BP Location: Left Arm)   Pulse 62   Temp 98.4 F (36.9 C) (Oral)   Resp 18   LMP 10/29/2011   SpO2 100%   Physical Exam  Constitutional: She is oriented to person, place, and time.  Obese, no acute distress.  HENT:  Head: Normocephalic and atraumatic.  Eyes: Conjunctivae are normal.  Neck: Neck supple.  Cardiovascular: Normal rate and regular rhythm.   Pulmonary/Chest: Effort normal and breath sounds normal.  Abdominal: Soft. Bowel sounds are normal.  Musculoskeletal: Normal range of motion.  Neurological: She is alert and oriented to person, place, and time.  Skin: Skin is warm and dry.  Psychiatric: She has a normal mood and affect. Her behavior is normal.  Nursing note and vitals reviewed.    ED Treatments / Results  Labs (all labs ordered  are listed, but only abnormal results are displayed) Labs Reviewed  CBC WITH DIFFERENTIAL/PLATELET - Abnormal; Notable for the following:       Result Value   Hemoglobin 11.4 (*)    MCH 24.9 (*)    All other components within normal limits  COMPREHENSIVE METABOLIC PANEL - Abnormal; Notable for the following:    BUN 24 (*)    All other components within normal limits  LIPASE, BLOOD  PREGNANCY, URINE  URINALYSIS, ROUTINE W REFLEX MICROSCOPIC  RAPID URINE DRUG SCREEN, HOSP PERFORMED    EKG  EKG  Interpretation None       Radiology No results found.  Procedures Procedures (including critical care time)  Medications Ordered in ED Medications - No data to display   Initial Impression / Assessment and Plan / ED Course  I have reviewed the triage vital signs and the nursing notes.  Pertinent labs & imaging results that were available during my care of the patient were reviewed by me and considered in my medical decision making (see chart for details).     No acute abdomen. Labs are reassuring. Patient will be discharged for outpatient follow-up.  Final Clinical Impressions(s) / ED Diagnoses   Final diagnoses:  Abdominal pain, unspecified abdominal location    New Prescriptions Discharge Medication List as of 02/24/2017  3:21 PM       Nat Christen, MD 02/25/17 [phone]

## 2017-03-02 ENCOUNTER — Emergency Department (HOSPITAL_COMMUNITY)
Admission: EM | Admit: 2017-03-02 | Discharge: 2017-03-03 | Disposition: A | Discharge status: Home / Self Care | Payer: Self-pay | Attending: Emergency Medicine | Admitting: Emergency Medicine

## 2017-03-02 ENCOUNTER — Emergency Department (HOSPITAL_COMMUNITY): Payer: Self-pay

## 2017-03-02 ENCOUNTER — Encounter (HOSPITAL_COMMUNITY): Payer: Self-pay | Admitting: Emergency Medicine

## 2017-03-02 DIAGNOSIS — Z79899 Other long term (current) drug therapy: Secondary | ICD-10-CM | POA: Insufficient documentation

## 2017-03-02 DIAGNOSIS — E119 Type 2 diabetes mellitus without complications: Secondary | ICD-10-CM | POA: Insufficient documentation

## 2017-03-02 DIAGNOSIS — R42 Dizziness and giddiness: Secondary | ICD-10-CM | POA: Insufficient documentation

## 2017-03-02 DIAGNOSIS — I10 Essential (primary) hypertension: Secondary | ICD-10-CM | POA: Insufficient documentation

## 2017-03-02 LAB — URINALYSIS, ROUTINE W REFLEX MICROSCOPIC
BILIRUBIN URINE: NEGATIVE
GLUCOSE, UA: NEGATIVE mg/dL
Hgb urine dipstick: NEGATIVE
KETONES UR: NEGATIVE mg/dL
Leukocytes, UA: NEGATIVE
Nitrite: NEGATIVE
PH: 5 (ref 5.0–8.0)
Protein, ur: NEGATIVE mg/dL
Specific Gravity, Urine: 1.015 (ref 1.005–1.030)

## 2017-03-02 LAB — BASIC METABOLIC PANEL
Anion gap: 7 (ref 5–15)
BUN: 18 mg/dL (ref 6–20)
CO2: 23 mmol/L (ref 22–32)
Calcium: 8.9 mg/dL (ref 8.9–10.3)
Chloride: 109 mmol/L (ref 101–111)
Creatinine, Ser: 0.92 mg/dL (ref 0.44–1.00)
GFR calc Af Amer: >60 mL/min (ref >60)
Glucose, Bld: 99 mg/dL (ref 65–99)
POTASSIUM: 3.6 mmol/L (ref 3.5–5.1)
SODIUM: 139 mmol/L (ref 135–145)

## 2017-03-02 LAB — CBC WITH DIFFERENTIAL/PLATELET
Basophils Absolute: 0 10*3/uL (ref 0.0–0.1)
Basophils Relative: 0 %
EOS ABS: 0.2 10*3/uL (ref 0.0–0.7)
EOS PCT: 3 %
HCT: 36 % (ref 36.0–46.0)
HEMOGLOBIN: 11.5 g/dL — AB (ref 12.0–15.0)
LYMPHS ABS: 2.6 10*3/uL (ref 0.7–4.0)
Lymphocytes Relative: 42 %
MCH: 25.4 pg — AB (ref 26.0–34.0)
MCHC: 31.9 g/dL (ref 30.0–36.0)
MCV: 79.6 fL (ref 78.0–100.0)
MONOS PCT: 5 %
Monocytes Absolute: 0.3 10*3/uL (ref 0.1–1.0)
NEUTROS PCT: 50 %
Neutro Abs: 3 10*3/uL (ref 1.7–7.7)
Platelets: 277 10*3/uL (ref 150–400)
RBC: 4.52 MIL/uL (ref 3.87–5.11)
RDW: 13.8 % (ref 11.5–15.5)
WBC: 6.1 10*3/uL (ref 4.0–10.5)

## 2017-03-02 LAB — RAPID URINE DRUG SCREEN, HOSP PERFORMED
Amphetamines: NOT DETECTED
Barbiturates: NOT DETECTED
Benzodiazepines: NOT DETECTED
Cocaine: NOT DETECTED
OPIATES: NOT DETECTED
TETRAHYDROCANNABINOL: NOT DETECTED

## 2017-03-02 LAB — PREGNANCY, URINE: Preg Test, Ur: NEGATIVE

## 2017-03-02 MED ORDER — MECLIZINE HCL 25 MG PO TABS: 25.0000 mg | ORAL_TABLET | Freq: Every day | ORAL | 0 refills | Status: AC

## 2017-03-02 MED ORDER — MECLIZINE HCL 25 MG PO TABS
25.0000 mg | ORAL_TABLET | Freq: Once | ORAL | Status: AC
  Administered 2017-03-02: 25 mg via ORAL
  Filled 2017-03-02: qty 1

## 2017-03-02 MED ORDER — SODIUM CHLORIDE 0.9 % IV BOLUS (SEPSIS)
1000.0000 mL | Freq: Once | INTRAVENOUS | Status: AC
  Administered 2017-03-02: 1000 mL via INTRAVENOUS

## 2017-03-02 NOTE — ED Notes (Signed)
Pt was able to ambulate without assist, steady gait
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
## 2017-03-02 NOTE — ED Triage Notes (Signed)
Per EMS, pt has "had dizziness all day on right side of head".  Neuro exam unremarkable. Denies numbness, tingling, or vision changes.  Pt is alert & ambulatory on arrival.

## 2017-03-02 NOTE — Discharge Instructions (Signed)
Take medications as prescribed.   Follow-up with your primary care doctor in 24-48 hours for re-evaluation. If you do not have a primary care doctor you can be seen by one of the clinics below.  Make sure you are properly eating and drinking.   Return to the Emergency Dept for any worsening dizziness, confusion, persistent  vomiting, weakness of your arms or legs, or any other worsening or concerning symptoms.    If you do not have a primary care doctor you see regularly, please you the list below. Please call them to arrange for follow-up.    No Primary Care Doctor Call [facility] Other agencies that provide inexpensive medical care    [place]  [phone]    [facility]  [place]  [phone]    [name]  [phone]    [facility]  [phone]    [facility]  [phone]
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
## 2017-03-03 LAB — ETHANOL: Alcohol, Ethyl (B): <5 mg/dL (ref <5)
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
## 2017-03-03 NOTE — ED Notes (Signed)
Pt did not want to be discharged.  She wanted to get some anxiety medication.  I encouraged pt to follow up with her outpatient provider.  All of her belongings were returned to patient.  She was in no distress.
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
## 2017-03-16 ENCOUNTER — Emergency Department (HOSPITAL_COMMUNITY)
Admission: EM | Admit: 2017-03-16 | Discharge: 2017-03-16 | Disposition: A | Discharge status: Home / Self Care | Payer: Self-pay | Attending: Emergency Medicine | Admitting: Emergency Medicine

## 2017-03-16 ENCOUNTER — Encounter (HOSPITAL_COMMUNITY): Payer: Self-pay | Admitting: Emergency Medicine

## 2017-03-16 DIAGNOSIS — F22 Delusional disorders: Secondary | ICD-10-CM | POA: Insufficient documentation

## 2017-03-16 DIAGNOSIS — E119 Type 2 diabetes mellitus without complications: Secondary | ICD-10-CM | POA: Insufficient documentation

## 2017-03-16 DIAGNOSIS — R42 Dizziness and giddiness: Secondary | ICD-10-CM | POA: Insufficient documentation

## 2017-03-16 DIAGNOSIS — Z79899 Other long term (current) drug therapy: Secondary | ICD-10-CM | POA: Insufficient documentation

## 2017-03-16 DIAGNOSIS — I10 Essential (primary) hypertension: Secondary | ICD-10-CM | POA: Insufficient documentation

## 2017-03-16 LAB — COMPREHENSIVE METABOLIC PANEL
ALBUMIN: 3.8 g/dL (ref 3.5–5.0)
ALK PHOS: 113 U/L (ref 38–126)
ALT: 20 U/L (ref 14–54)
AST: 31 U/L (ref 15–41)
Anion gap: 7 (ref 5–15)
BILIRUBIN TOTAL: 0.5 mg/dL (ref 0.3–1.2)
BUN: 16 mg/dL (ref 6–20)
CALCIUM: 9.1 mg/dL (ref 8.9–10.3)
CO2: 23 mmol/L (ref 22–32)
CREATININE: 0.87 mg/dL (ref 0.44–1.00)
Chloride: 108 mmol/L (ref 101–111)
GFR calc Af Amer: >60 mL/min (ref >60)
GFR calc non Af Amer: >60 mL/min (ref >60)
GLUCOSE: 116 mg/dL — AB (ref 65–99)
Potassium: 4 mmol/L (ref 3.5–5.1)
SODIUM: 138 mmol/L (ref 135–145)
TOTAL PROTEIN: 6.6 g/dL (ref 6.5–8.1)

## 2017-03-16 LAB — CBC
HCT: 34.5 % — ABNORMAL LOW (ref 36.0–46.0)
Hemoglobin: 10.7 g/dL — ABNORMAL LOW (ref 12.0–15.0)
MCH: 24.8 pg — AB (ref 26.0–34.0)
MCHC: 31 g/dL (ref 30.0–36.0)
MCV: 80 fL (ref 78.0–100.0)
PLATELETS: 288 10*3/uL (ref 150–400)
RBC: 4.31 MIL/uL (ref 3.87–5.11)
RDW: 14 % (ref 11.5–15.5)
WBC: 5.4 10*3/uL (ref 4.0–10.5)
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
## 2017-03-16 NOTE — ED Triage Notes (Signed)
Pt complains of several issues, most important to her is that she feels dizzy and trembles.  She has been checked out several times but "am sure something is wrong.  Denies n/v/d, numbness, tingling or weakness.

## 2017-05-30 ENCOUNTER — Emergency Department (HOSPITAL_COMMUNITY)
Admission: EM | Admit: 2017-05-30 | Discharge: 2017-05-30 | Discharge status: Against Medical Advice | Payer: Self-pay | Attending: Emergency Medicine | Admitting: Emergency Medicine

## 2017-05-30 ENCOUNTER — Encounter (HOSPITAL_COMMUNITY): Payer: Self-pay | Admitting: *Deleted

## 2017-05-30 DIAGNOSIS — I10 Essential (primary) hypertension: Secondary | ICD-10-CM | POA: Insufficient documentation

## 2017-05-30 DIAGNOSIS — F419 Anxiety disorder, unspecified: Secondary | ICD-10-CM | POA: Insufficient documentation

## 2017-05-30 DIAGNOSIS — E119 Type 2 diabetes mellitus without complications: Secondary | ICD-10-CM | POA: Insufficient documentation

## 2017-05-30 LAB — URINALYSIS, ROUTINE W REFLEX MICROSCOPIC
BILIRUBIN URINE: NEGATIVE
GLUCOSE, UA: NEGATIVE mg/dL
HGB URINE DIPSTICK: NEGATIVE
KETONES UR: NEGATIVE mg/dL
Leukocytes, UA: NEGATIVE
Nitrite: NEGATIVE
PROTEIN: NEGATIVE mg/dL
Specific Gravity, Urine: 1.002 — ABNORMAL LOW (ref 1.005–1.030)
pH: 6 (ref 5.0–8.0)
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
## 2017-05-30 NOTE — ED Notes (Signed)
Pt got up and got her belongings and walk out of the ED department. Provider notified

## 2017-05-30 NOTE — ED Provider Notes (Signed)
[facility] DEPT Provider Note   CSN: [id number] Arrival date & time: 05/30/17  0007     History   Chief Complaint Chief Complaint  Patient presents with  . Anxiety    HPI [name] is a 51 y.o. female who presents with blurry vision and "urinary pressure". She is a poor and rambling historian. She states that originally she came because she was afraid to sleep at her apartment due to harrassment issues but states that now that it is morning she wants to leave because she is okay during daylight. She also has had urinary symptoms for months and blurry vision for unknown amount of time. She does not have a PCP but cannot clearly tell me why.  LEVEL 5 CAVEAT due to mental disorder  HPI  Past Medical History:  Diagnosis Date  . Anxiety   . Diabetes mellitus without complication ([place])   . Hypertension   . Major depressive disorder, recurrent, severe with psychotic features [facility])     Patient Active Problem List   Diagnosis Date Noted  . Anxiety 03/29/2016  . MDD (major depressive disorder), recurrent severe, without psychosis ([place]) 11/18/2015  . Homelessness 11/18/2015  . Tobacco use disorder 07/05/2015  . Malnutrition of moderate degree ([place]) 05/31/2015  . Hypokalemia 05/30/2015  . Obstipation 05/30/2015  . Prolonged QT interval 05/30/2015  . Hypertension 05/30/2015  . 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 Suicidal ideations 03/12/2015  . Diabetes ([place]) 09/06/2012  . Delusional disorder ([place]) 04/16/2012    History reviewed. No pertinent surgical history.  OB History    No data available       Home Medications    Prior to Admission medications   Medication Sig Start Date End Date Taking? Authorizing Provider  hydrOXYzine (ATARAX/VISTARIL) 25 MG tablet Take 1 tablet (25 mg total) by mouth every 8 (eight) hours as needed for anxiety. Patient not taking: Reported on 02/24/2017 01/24/17   Julianne Rice, MD    metoCLOPramide (REGLAN) 10 MG tablet Take 1 tablet (10 mg total) by mouth every 8 (eight) hours as needed for nausea or vomiting. Patient not taking: Reported on 03/02/2017 09/18/16   Duffy Bruce, MD    Family History Family History  Problem Relation Age of Onset  . Hypertension Mother   . Diabetes Mother   . Aneurysm Mother   . Hypertension Father   . Diabetes Father   . Hypertension Sister   . Diabetes Sister   . Hypertension Other   . Diabetes Other     Social History Social History  Substance Use Topics  . Smoking status: Never Smoker  . Smokeless tobacco: Never Used  . Alcohol use No     Allergies   Patient has no known allergies.   Review of Systems Review of Systems  Unable to perform ROS: Psychiatric disorder     Physical Exam Updated Vital Signs BP 106/69   Pulse 63   Temp 98.3 F (36.8 C) (Oral)   Resp 16   LMP 10/29/2011 Comment: NEG U PREG 02/24/17  SpO2 100%   Physical Exam  Constitutional: She is oriented to person, place, and time. She appears well-developed and well-nourished. No distress.  Obese female in NAD  HENT:  Head: Normocephalic and atraumatic.  Eyes: Pupils are equal, round, and reactive to light. Conjunctivae are normal. Right eye exhibits no discharge. Left eye exhibits no discharge. No scleral icterus.  Neck: Normal range of motion.  Cardiovascular: Normal rate.  Pulmonary/Chest: Effort normal. No respiratory distress.  Abdominal: She exhibits no distension.  Neurological: She is alert and oriented to person, place, and time.  Skin: Skin is warm and dry.  Psychiatric: Her mood appears anxious. Her speech is tangential. She is hyperactive. Thought content is paranoid and delusional. She expresses impulsivity. She expresses no homicidal and no suicidal ideation. She expresses no suicidal plans and no homicidal plans. She is inattentive.  Nursing note and vitals reviewed.    ED Treatments / Results  Labs (all labs ordered  are listed, but only abnormal results are displayed) Labs Reviewed  URINALYSIS, ROUTINE W REFLEX MICROSCOPIC - Abnormal; Notable for the following:       Result Value   Color, Urine COLORLESS (*)    Specific Gravity, Urine 1.002 (*)    All other components within normal limits    EKG  EKG Interpretation None       Radiology No results found.  Procedures Procedures (including critical care time)  Medications Ordered in ED Medications - No data to display   Initial Impression / Assessment and Plan / ED Course  I have reviewed the triage vital signs and the nursing notes.  Pertinent labs & imaging results that were available during my care of the patient were reviewed by me and considered in my medical decision making (see chart for details).  51 year old female with multiple complaints. She wants to leave. She clearly has a psychatric diagnosis but does not appear psychotic, suicidal, or homicidal at this time. UA was ordered due to urinary complaints but is not consistent with UTI. Pt eloped from the ED before results were discussed.    Final Clinical Impressions(s) / ED Diagnoses   Final diagnoses:  Anxiety    New Prescriptions Discharge Medication List as of 05/30/2017  4:09 AM       Recardo Evangelist, PA-C 05/30/17 9030    Ripley Fraise, MD 05/31/17 [phone]
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
## 2017-05-30 NOTE — ED Notes (Signed)
Patient ambulated independently to room.  Gait steady and even.

## 2017-06-19 ENCOUNTER — Emergency Department (HOSPITAL_COMMUNITY)
Admission: EM | Admit: 2017-06-19 | Discharge: 2017-06-19 | Discharge status: Against Medical Advice | Payer: Self-pay | Attending: Physician Assistant | Admitting: Physician Assistant

## 2017-06-19 ENCOUNTER — Encounter (HOSPITAL_COMMUNITY): Payer: Self-pay | Admitting: Emergency Medicine

## 2017-06-19 DIAGNOSIS — R42 Dizziness and giddiness: Secondary | ICD-10-CM | POA: Insufficient documentation

## 2017-06-19 DIAGNOSIS — Z5321 Procedure and treatment not carried out due to patient leaving prior to being seen by health care provider: Secondary | ICD-10-CM | POA: Insufficient documentation

## 2017-06-19 LAB — CBC
HCT: 35.5 % — ABNORMAL LOW (ref 36.0–46.0)
Hemoglobin: 11 g/dL — ABNORMAL LOW (ref 12.0–15.0)
MCH: 24.9 pg — AB (ref 26.0–34.0)
MCHC: 31 g/dL (ref 30.0–36.0)
MCV: 80.5 fL (ref 78.0–100.0)
Platelets: 307 10*3/uL (ref 150–400)
RBC: 4.41 MIL/uL (ref 3.87–5.11)
RDW: 14.5 % (ref 11.5–15.5)
WBC: 4.6 10*3/uL (ref 4.0–10.5)

## 2017-06-19 LAB — BASIC METABOLIC PANEL
Anion gap: 9 (ref 5–15)
BUN: 19 mg/dL (ref 6–20)
CHLORIDE: 108 mmol/L (ref 101–111)
CO2: 23 mmol/L (ref 22–32)
CREATININE: 0.84 mg/dL (ref 0.44–1.00)
Calcium: 9.1 mg/dL (ref 8.9–10.3)
GFR calc Af Amer: >60 mL/min (ref >60)
GFR calc non Af Amer: >60 mL/min (ref >60)
Glucose, Bld: 135 mg/dL — ABNORMAL HIGH (ref 65–99)
POTASSIUM: 4 mmol/L (ref 3.5–5.1)
SODIUM: 140 mmol/L (ref 135–145)

## 2017-06-19 NOTE — ED Triage Notes (Addendum)
Patient got out of wheelchair in lobby and started screaming at the top of her lungs, "Oh thank you Lord I won, I won!!" while jumping up and down. This goes on for several minutes. Agricultural consultant and [employer] in lobby. Patient consoled by bystander and stops screaming, but continues to say "Thank you Jesus, I won. I can't believe I won the lottery."
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
## 2017-06-19 NOTE — ED Notes (Signed)
Per registration pt left the hospital, does not answer when called

## 2017-06-24 ENCOUNTER — Emergency Department (HOSPITAL_COMMUNITY): Admission: EM | Admit: 2017-06-24 | Discharge: 2017-06-24 | Discharge status: Against Medical Advice | Payer: Self-pay

## 2017-06-24 NOTE — ED Notes (Signed)
Unable to locate pt in lobby x 3  

## 2017-06-24 NOTE — ED Notes (Signed)
Pt's name was called for triage, but no response from the waiting room.

## 2017-06-24 NOTE — ED Notes (Addendum)
Called pt's name again, but still no response.

## 2017-07-05 ENCOUNTER — Encounter (HOSPITAL_COMMUNITY): Payer: Self-pay

## 2017-07-05 ENCOUNTER — Emergency Department (HOSPITAL_COMMUNITY)
Admission: EM | Admit: 2017-07-05 | Discharge: 2017-07-05 | Discharge status: Against Medical Advice | Payer: Self-pay | Attending: Emergency Medicine | Admitting: Emergency Medicine

## 2017-07-05 DIAGNOSIS — Z5321 Procedure and treatment not carried out due to patient leaving prior to being seen by health care provider: Secondary | ICD-10-CM

## 2017-07-05 NOTE — ED Triage Notes (Signed)
Pt called EMS saying that she was scared, wouldn't say what she was scared of Pt has called EMS several times today as well as [employer]

## 2017-07-05 NOTE — ED Notes (Signed)
Pt not in room at this time. Lobby checked and no answer from patient. Provider notified along with charge nurse.

## 2017-07-05 NOTE — ED Provider Notes (Signed)
Patient left without being seen  1. Patient left without being seen       Deno Etienne, DO 07/05/17 [id number]

## 2017-07-12 DIAGNOSIS — F600 Paranoid personality disorder: Secondary | ICD-10-CM | POA: Insufficient documentation

## 2017-07-12 DIAGNOSIS — R442 Other hallucinations: Secondary | ICD-10-CM | POA: Insufficient documentation

## 2017-07-12 DIAGNOSIS — E119 Type 2 diabetes mellitus without complications: Secondary | ICD-10-CM | POA: Insufficient documentation

## 2017-07-12 DIAGNOSIS — I10 Essential (primary) hypertension: Secondary | ICD-10-CM | POA: Insufficient documentation
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
## 2017-07-13 ENCOUNTER — Emergency Department (HOSPITAL_COMMUNITY)
Admission: EM | Admit: 2017-07-13 | Discharge: 2017-07-13 | Disposition: A | Discharge status: Home / Self Care | Payer: Self-pay | Attending: Emergency Medicine | Admitting: Emergency Medicine

## 2017-07-13 ENCOUNTER — Encounter (HOSPITAL_COMMUNITY): Payer: Self-pay | Admitting: Emergency Medicine

## 2017-07-13 DIAGNOSIS — E119 Type 2 diabetes mellitus without complications: Secondary | ICD-10-CM | POA: Insufficient documentation

## 2017-07-13 DIAGNOSIS — F22 Delusional disorders: Secondary | ICD-10-CM | POA: Insufficient documentation

## 2017-07-13 DIAGNOSIS — Z046 Encounter for general psychiatric examination, requested by authority: Secondary | ICD-10-CM | POA: Insufficient documentation

## 2017-07-13 DIAGNOSIS — I10 Essential (primary) hypertension: Secondary | ICD-10-CM | POA: Insufficient documentation

## 2017-07-13 DIAGNOSIS — F419 Anxiety disorder, unspecified: Secondary | ICD-10-CM | POA: Insufficient documentation

## 2017-07-13 LAB — CBC WITH DIFFERENTIAL/PLATELET
BASOS ABS: 0 10*3/uL (ref 0.0–0.1)
Basophils Relative: 0 %
Eosinophils Absolute: 0.2 10*3/uL (ref 0.0–0.7)
Eosinophils Relative: 4 %
HCT: 36.9 % (ref 36.0–46.0)
Hemoglobin: 12 g/dL (ref 12.0–15.0)
Lymphocytes Relative: 40 %
Lymphs Abs: 2.2 10*3/uL (ref 0.7–4.0)
MCH: 25.6 pg — AB (ref 26.0–34.0)
MCHC: 32.5 g/dL (ref 30.0–36.0)
MCV: 78.8 fL (ref 78.0–100.0)
Monocytes Absolute: 0.2 10*3/uL (ref 0.1–1.0)
Monocytes Relative: 3 %
NEUTROS ABS: 2.9 10*3/uL (ref 1.7–7.7)
Neutrophils Relative %: 53 %
PLATELETS: 276 10*3/uL (ref 150–400)
RBC: 4.68 MIL/uL (ref 3.87–5.11)
RDW: 14.2 % (ref 11.5–15.5)
WBC: 5.5 10*3/uL (ref 4.0–10.5)

## 2017-07-13 LAB — RAPID URINE DRUG SCREEN, HOSP PERFORMED
Amphetamines: NOT DETECTED
BARBITURATES: NOT DETECTED
Benzodiazepines: NOT DETECTED
Cocaine: NOT DETECTED
Opiates: NOT DETECTED
Tetrahydrocannabinol: NOT DETECTED

## 2017-07-13 LAB — COMPREHENSIVE METABOLIC PANEL
ALK PHOS: 117 U/L (ref 38–126)
ALT: 24 U/L (ref 14–54)
ANION GAP: 8 (ref 5–15)
AST: 23 U/L (ref 15–41)
Albumin: 4.3 g/dL (ref 3.5–5.0)
BILIRUBIN TOTAL: 0.3 mg/dL (ref 0.3–1.2)
BUN: 20 mg/dL (ref 6–20)
CALCIUM: 9.4 mg/dL (ref 8.9–10.3)
CO2: 22 mmol/L (ref 22–32)
Chloride: 109 mmol/L (ref 101–111)
Creatinine, Ser: 0.8 mg/dL (ref 0.44–1.00)
GFR calc Af Amer: >60 mL/min (ref >60)
GFR calc non Af Amer: >60 mL/min (ref >60)
GLUCOSE: 104 mg/dL — AB (ref 65–99)
Potassium: 3.9 mmol/L (ref 3.5–5.1)
Sodium: 139 mmol/L (ref 135–145)
TOTAL PROTEIN: 7.7 g/dL (ref 6.5–8.1)

## 2017-07-13 LAB — ETHANOL: Alcohol, Ethyl (B): <5 mg/dL (ref <5)

## 2017-07-13 LAB — I-STAT BETA HCG BLOOD, ED (MC, WL, AP ONLY): I-stat hCG, quantitative: <5 m[IU]/mL (ref <5)

## 2017-07-13 MED ORDER — ACETAMINOPHEN 325 MG PO TABS: 650.0000 mg | ORAL_TABLET | ORAL | Status: DC | PRN

## 2017-07-13 NOTE — BH Assessment (Addendum)
[facility] Assessment Progress Note   AM psych eval per Lindon Romp, NP. Luz Lex, RN notified of disposition.    Lind Covert, MSW, [facility] TTS Specialist [phone]
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
## 2017-07-13 NOTE — ED Notes (Signed)
Bed: WTR5 Expected date:  Expected time:  Means of arrival:  Comments: 
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
## 2017-07-13 NOTE — ED Notes (Signed)
Pt observed ambulating in the hallway to the bathroom with a steady gait.

## 2017-07-13 NOTE — ED Notes (Signed)
Pt states she would like to leave. Dr. Zenia Resides made aware.

## 2017-07-13 NOTE — ED Notes (Signed)
Pt noted to be responding to external stimuli.

## 2017-07-13 NOTE — ED Notes (Signed)
Informed that Pyschiatrist was intending to discharge pt and Dr. Zenia Resides in the middle of writing up discharge instructions.  Pt advised to stay for papers but stated that she really needed to go.  Pt expressed understanding but decided to leave prior to receiving any discharge instructions. Pt ambulated out of department with a steady gait.

## 2017-07-14 ENCOUNTER — Emergency Department (HOSPITAL_COMMUNITY)
Admission: EM | Admit: 2017-07-14 | Discharge: 2017-07-14 | Disposition: A | Discharge status: Home / Self Care | Payer: Self-pay | Attending: Emergency Medicine | Admitting: Emergency Medicine

## 2017-07-14 DIAGNOSIS — F22 Delusional disorders: Secondary | ICD-10-CM

## 2017-07-14 NOTE — ED Provider Notes (Signed)
[place] DEPT Provider Note   CSN: [id number] Arrival date & time: 07/13/17  2114     History   Chief Complaint Chief Complaint  Patient presents with  . Anxiety  . Paranoid    HPI [name] is a 51 y.o. female.  Patient presents to the emergency department with chief complaint of anxiety. She is unable to effectively participate in her history at this time. Level V caveat applies secondary to psychiatric disorder.   The history is provided by the patient. No language interpreter was used.    Past Medical History:  Diagnosis Date  . Anxiety   . Diabetes mellitus without complication ([place])   . Hypertension   . Major depressive disorder, recurrent, severe with psychotic features [facility])     Patient Active Problem List   Diagnosis Date Noted  . Anxiety 03/29/2016  . MDD (major depressive disorder), recurrent severe, without psychosis ([place]) 11/18/2015  . Homelessness 11/18/2015  . Tobacco use disorder 07/05/2015  . Malnutrition of moderate degree ([place]) 05/31/2015  . Hypokalemia 05/30/2015  . Obstipation 05/30/2015  . Prolonged QT interval 05/30/2015  . Hypertension 05/30/2015  . 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 Suicidal ideations 03/12/2015  . Diabetes ([place]) 09/06/2012  . Delusional disorder ([place]) 04/16/2012    History reviewed. No pertinent surgical history.  OB History    No data available       Home Medications    Prior to Admission medications   Medication Sig Start Date End Date Taking? Authorizing Provider  hydrOXYzine (ATARAX/VISTARIL) 25 MG tablet Take 1 tablet (25 mg total) by mouth every 8 (eight) hours as needed for anxiety. Patient not taking: Reported on 02/24/2017 01/24/17   Julianne Rice, MD    Family History Family History  Problem Relation Age of Onset  . Hypertension Mother   . Diabetes Mother   . Aneurysm Mother   . Hypertension Father   . Diabetes Father   . Hypertension  Sister   . Diabetes Sister   . Hypertension Other   . Diabetes Other     Social History Social History  Substance Use Topics  . Smoking status: Never Smoker  . Smokeless tobacco: Never Used  . Alcohol use No     Allergies   Patient has no known allergies.   Review of Systems Review of Systems  All other systems reviewed and are negative.    Physical Exam Updated Vital Signs BP 124/84 (BP Location: Left Arm)   Pulse 71   Temp 98.1 F (36.7 C) (Oral)   Resp 16   Ht 5\' 5"  (1.651 m)   Wt 114.3 kg (252 lb)   LMP 10/29/2011 Comment: NEG U PREG 02/24/17  SpO2 100%   BMI 41.93 kg/m   Physical Exam  Constitutional: She is oriented to person, place, and time. She appears well-developed and well-nourished.  Anxious appearing  HENT:  Head: Normocephalic and atraumatic.  Eyes: Pupils are equal, round, and reactive to light. Conjunctivae and EOM are normal.  Neck: Normal range of motion. Neck supple.  Cardiovascular: Normal rate and regular rhythm.  Exam reveals no gallop and no friction rub.   No murmur heard. Pulmonary/Chest: Effort normal and breath sounds normal. No respiratory distress. She has no wheezes. She has no rales. She exhibits no tenderness.  Abdominal: Soft. Bowel sounds are normal. She exhibits no distension and no mass. There is no tenderness. There is no rebound and no guarding.  Musculoskeletal: Normal range  of motion. She exhibits no edema or tenderness.  Neurological: She is alert and oriented to person, place, and time.  Skin: Skin is warm and dry.  Psychiatric:  Tangential and paranoid thoughts and speech  Nursing note and vitals reviewed.    ED Treatments / Results  Labs (all labs ordered are listed, but only abnormal results are displayed) Labs Reviewed - No data to display  EKG  EKG Interpretation None       Radiology No results found.  Procedures Procedures (including critical care time)  Medications Ordered in ED Medications -  No data to display   Initial Impression / Assessment and Plan / ED Course  I have reviewed the triage vital signs and the nursing notes.  Pertinent labs & imaging results that were available during my care of the patient were reviewed by me and considered in my medical decision making (see chart for details).     Patient here with anxiety and paranoia. She was seen here yesterday for the same, but ended up leaving. It is unclear whether she actually saw psychiatry or not. There is no note from psychiatry in the medical record from yesterday. It was advised by TTS that the patient see psychiatry yesterday morning. In reviewing the nursing notes, seems as though the patient may have left prior to this happening. Patient rambles on and on about not having the money for the [facility] and not feeling safe at home, feeling as though she is being watched overly concerned about having not brushed her teeth.    Will contact TTS, and see about the patient being seen by psychiatry this morning.  All labs from yesterday reviewed.  Will not reorder.  Patient medically clear for TTS evaluation.  TTS recommends AM pysch evaluation.   Final Clinical Impressions(s) / ED Diagnoses   Final diagnoses:  None    New Prescriptions New Prescriptions   No medications on file     Montine Circle, Hershal Coria 07/14/17 [id number]    Molpus, Jenny Reichmann, MD 07/14/17 [phone]
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
## 2017-07-14 NOTE — BHH Counselor (Signed)
Per Patriciaann Clan, PA-C : AM Psychriatric Evaluation recommended.   [facility], Montine Circle, PA-C notified at [phone].

## 2017-07-14 NOTE — ED Notes (Signed)
Bed: WTR7 Expected date:  Expected time:  Means of arrival:  Comments: 

## 2017-07-22 ENCOUNTER — Emergency Department (HOSPITAL_COMMUNITY)
Admission: EM | Admit: 2017-07-22 | Discharge: 2017-07-23 | Disposition: A | Discharge status: Home / Self Care | Payer: Self-pay | Attending: Emergency Medicine | Admitting: Emergency Medicine

## 2017-07-22 ENCOUNTER — Encounter (HOSPITAL_COMMUNITY): Payer: Self-pay | Admitting: *Deleted

## 2017-07-22 DIAGNOSIS — I10 Essential (primary) hypertension: Secondary | ICD-10-CM | POA: Insufficient documentation

## 2017-07-22 DIAGNOSIS — F4325 Adjustment disorder with mixed disturbance of emotions and conduct: Secondary | ICD-10-CM | POA: Insufficient documentation

## 2017-07-22 DIAGNOSIS — F22 Delusional disorders: Secondary | ICD-10-CM | POA: Insufficient documentation

## 2017-07-22 DIAGNOSIS — E119 Type 2 diabetes mellitus without complications: Secondary | ICD-10-CM | POA: Insufficient documentation

## 2017-07-22 DIAGNOSIS — R45851 Suicidal ideations: Secondary | ICD-10-CM | POA: Insufficient documentation

## 2017-07-22 LAB — RAPID URINE DRUG SCREEN, HOSP PERFORMED
Amphetamines: NOT DETECTED
Barbiturates: NOT DETECTED
Benzodiazepines: NOT DETECTED
Cocaine: NOT DETECTED
Opiates: NOT DETECTED
Tetrahydrocannabinol: NOT DETECTED

## 2017-07-22 LAB — I-STAT BETA HCG BLOOD, ED (MC, WL, AP ONLY): I-stat hCG, quantitative: <5 m[IU]/mL (ref <5)

## 2017-07-22 LAB — CBC
HEMATOCRIT: 36.8 % (ref 36.0–46.0)
HEMOGLOBIN: 11.6 g/dL — AB (ref 12.0–15.0)
MCH: 25.2 pg — AB (ref 26.0–34.0)
MCHC: 31.5 g/dL (ref 30.0–36.0)
MCV: 80 fL (ref 78.0–100.0)
PLATELETS: 241 10*3/uL (ref 150–400)
RBC: 4.6 MIL/uL (ref 3.87–5.11)
RDW: 14.2 % (ref 11.5–15.5)
WBC: 4.9 10*3/uL (ref 4.0–10.5)

## 2017-07-22 LAB — COMPREHENSIVE METABOLIC PANEL
ALBUMIN: 4.1 g/dL (ref 3.5–5.0)
ALK PHOS: 132 U/L — AB (ref 38–126)
ALT: 21 U/L (ref 14–54)
ANION GAP: 8 (ref 5–15)
AST: 22 U/L (ref 15–41)
BUN: 28 mg/dL — ABNORMAL HIGH (ref 6–20)
CALCIUM: 9.3 mg/dL (ref 8.9–10.3)
CHLORIDE: 109 mmol/L (ref 101–111)
CO2: 25 mmol/L (ref 22–32)
Creatinine, Ser: 0.86 mg/dL (ref 0.44–1.00)
GFR calc non Af Amer: >60 mL/min (ref >60)
GLUCOSE: 100 mg/dL — AB (ref 65–99)
Potassium: 4.2 mmol/L (ref 3.5–5.1)
SODIUM: 142 mmol/L (ref 135–145)
Total Bilirubin: 0.4 mg/dL (ref 0.3–1.2)
Total Protein: 7.6 g/dL (ref 6.5–8.1)

## 2017-07-22 LAB — ACETAMINOPHEN LEVEL

## 2017-07-22 LAB — ETHANOL: Alcohol, Ethyl (B): <10 mg/dL (ref <10)

## 2017-07-22 LAB — SALICYLATE LEVEL

## 2017-07-22 MED ORDER — ASENAPINE MALEATE 5 MG SL SUBL
10.0000 mg | SUBLINGUAL_TABLET | Freq: Two times a day (BID) | SUBLINGUAL | Status: DC
  Administered 2017-07-22 – 2017-07-23 (×3): 10 mg via SUBLINGUAL
  Filled 2017-07-22 (×3): qty 2

## 2017-07-22 NOTE — ED Notes (Signed)
Bed: WA27 Expected date:  Expected time:  Means of arrival:  Comments: 

## 2017-07-22 NOTE — ED Provider Notes (Signed)
[facility] DEPT Provider Note   CSN: [id number] Arrival date & time: 07/22/17  1057     History   Chief Complaint Chief Complaint  Patient presents with  . Medical Clearance    HPI [name] is a 51 y.o. female. The patient refuses to answer all questions or make eye contact. She will not states why she is here or if she has any medical complaints.   The history is provided by the patient. No language interpreter was used.    Past Medical History:  Diagnosis Date  . Anxiety   . Diabetes mellitus without complication ([place])   . Hypertension   . Major depressive disorder, recurrent, severe with psychotic features [place])     Patient Active Problem List   Diagnosis Date Noted  . Anxiety 03/29/2016  . MDD (major depressive disorder), recurrent severe, without psychosis ([place]) 11/18/2015  . Homelessness 11/18/2015  . Tobacco use disorder 07/05/2015  . Malnutrition of moderate degree ([place]) 05/31/2015  . Hypokalemia 05/30/2015  . Obstipation 05/30/2015  . Prolonged QT interval 05/30/2015  . Hypertension 05/30/2015  . 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 Suicidal ideations 03/12/2015  . Diabetes ([place]) 09/06/2012  . Delusional disorder ([place]) 04/16/2012    History reviewed. No pertinent surgical history.  OB History    No data available       Home Medications    Prior to Admission medications   Not on File    Family History Family History  Problem Relation Age of Onset  . Hypertension Mother   . Diabetes Mother   . Aneurysm Mother   . Hypertension Father   . Diabetes Father   . Hypertension Sister   . Diabetes Sister   . Hypertension Other   . Diabetes Other     Social History Social History  Substance Use Topics  . Smoking status: Never Smoker  . Smokeless tobacco: Never Used  . Alcohol use No     Allergies   Patient has no known allergies.   Review of Systems Review of Systems  Unable to  perform ROS: Psychiatric disorder     Physical Exam Updated Vital Signs BP 120/74   Pulse 62   Temp 98 F (36.7 C) (Oral)   Resp 18   LMP 10/29/2011 Comment: NEG U PREG 02/24/17  SpO2 100%   Physical Exam  Constitutional: No distress.  HENT:  Head: Normocephalic.  Eyes: Conjunctivae are normal.  Neck: Neck supple.  Cardiovascular: Normal rate, regular rhythm and normal heart sounds.  Exam reveals no gallop and no friction rub.   No murmur heard. Pulmonary/Chest: Effort normal. No respiratory distress.  Abdominal: Soft. She exhibits no distension.  Neurological: She is alert.  Skin: Skin is warm. No rash noted.  Psychiatric: Her behavior is normal.  Nursing note and vitals reviewed.  ED Treatments / Results  Labs (all labs ordered are listed, but only abnormal results are displayed) Labs Reviewed  COMPREHENSIVE METABOLIC PANEL - Abnormal; Notable for the following:       Result Value   Glucose, Bld 100 (*)    BUN 28 (*)    Alkaline Phosphatase 132 (*)    All other components within normal limits  ACETAMINOPHEN LEVEL - Abnormal; Notable for the following:    Acetaminophen (Tylenol), Serum <10 (*)    All other components within normal limits  CBC - Abnormal; Notable for the following:    Hemoglobin 11.6 (*)    MCH 25.2 (*)  All other components within normal limits  ETHANOL  SALICYLATE LEVEL  RAPID URINE DRUG SCREEN, HOSP PERFORMED  I-STAT BETA HCG BLOOD, ED ([facility], [facility], [facility] ONLY)    EKG  EKG Interpretation None       Radiology No results found.  Procedures Procedures (including critical care time)  Medications Ordered in ED Medications  asenapine (SAPHRIS) sublingual tablet 10 mg (10 mg Sublingual Given 07/22/17 1413)     Initial Impression / Assessment and Plan / ED Course  I have reviewed the triage vital signs and the nursing notes.  Pertinent labs & imaging results that were available during my care of the patient were reviewed by me and  considered in my medical decision making (see chart for details).     51 year old female with history of delusion disorder presenting to the emergency department for medical clearance. The patient refuses to make eye contact or answer any questions throughout her interaction. Attempted a second interview with the patient approximately one hour later after the patient had requested to speak with me; however, upon arrival, the patient refused to answer any questions. Labs reviewed. At this time, the patient is medically cleared. Consulted TTS who recommended patient treatment for delusional disorder.  Final Clinical Impressions(s) / ED Diagnoses   Final diagnoses:  Delusional disorder [facility])    New Prescriptions New Prescriptions   No medications on file     Joanne Gavel, PA-C 07/22/17 1710    Duffy Bruce, MD 07/26/17 1941

## 2017-07-22 NOTE — ED Notes (Signed)
Pt requesting to leave.  Informed would speak to EDP.  Pt verbalized understanding.

## 2017-07-22 NOTE — ED Triage Notes (Signed)
Patient will not answer questions at this time. Per [employer] representative the patient called 911 and told them she is been cyber bullied and the FBI is after her. Patient is resting and comfortable. Will continue to monitor patient.

## 2017-07-22 NOTE — ED Notes (Signed)
Patient denies SI,HI and AVH at this time. Plan of care discussed. Encouragement and support provided and safety maintain. Q 15 min safety checks remain in place and video monitoring.

## 2017-07-22 NOTE — ED Notes (Addendum)
Pt provided meal tray. Pt shook head no. When I asked pt if she was going to eat pt shook head no again and verbalized no quietly.

## 2017-07-22 NOTE — BH Assessment (Signed)
[place] [facility] Assessment Progress Note  Per Waylan Boga, DNP, this pt requires psychiatric hospitalization at this time.  The following facilities have been contacted to seek placement for this pt, with results as noted:  Beds available, information sent, decision pending:  [facility]   At capacity:  [place], [place] Triage Specialist [phone]

## 2017-07-22 NOTE — BH Assessment (Signed)
Assessment Note  [name] is a 51 y.o. female who presented to [facility] on a voluntary basis after contacting [employer].  Pt reported that she is being cyberbullied and stalked by a man.  Pt was last assessed by TTS on 07/14/17 with similar complaint.  Pt has made multiple presentations to [facility] with similar complaints.  She has a standing diagnosis of delusion disorder.  Pt provided history.  She reported that she has been stalked by the same man since 2006, that she has called police, and that they will not arrest the man in question, that his car is parked at a [employer] around her home.  Pt stated also that she is worried the man in question will come to her hospital room and kill her.  Pt made several statements that seemed tangential -- she reported that she wanted to go home and die and also that she is afraid to be alone even though she has called the [employer].  Pt was very tearful during the assessment -- she cried and sobbed.  She also was restless, standing up ("I think I have to throw up") and then kneeling on the bed.  Pt stated that she receives outpatient services at [facility], but she denied receiving psychiatric services.  Pt denied suicidal or homicidal ideation.  During assessment, Pt presented as alert and oriented.  She had poor eye contact.  Demeanor was tearful.  Pt was dressed in scrubs, and she appeared disheveled.  Pt's mood was fearful.  Affect was mood-congruent.  Pt denied current depressive symptoms, but it was apparent that she was despondent over perceived stalking.  Pt's speech was very soft and slow.  Thought processes were within normal range.  Thought content indicated the presence of delusion.  Pt's memory and concentration were fair.  Impulse control was fair.  Insight and judgment were poor.  Consulted with Thedora Hinders, DNP who recommended inpatient placement.  Diagnosis: Delusion Disorder  Past Medical History:  Past Medical History:  Diagnosis Date  .  Anxiety   . Diabetes mellitus without complication ([place])   . Hypertension   . Major depressive disorder, recurrent, severe with psychotic features ([place])     History reviewed. No pertinent surgical history.  Family History:  Family History  Problem Relation Age of Onset  . Hypertension Mother   . Diabetes Mother   . Aneurysm Mother   . Hypertension Father   . Diabetes Father   . Hypertension Sister   . Diabetes Sister   . Hypertension Other   . Diabetes Other     Social History:  reports that she has never smoked. She has never used smokeless tobacco. She reports that she does not drink alcohol or use drugs.  Additional Social History:  Alcohol / Drug Use Pain Medications: See MAR Prescriptions: See MAR Over the Counter: See MAR History of alcohol / drug use?: No history of alcohol / drug abuse  CIWA:   COWS:    Allergies: No Known Allergies  Home Medications:  (Not in a hospital admission)  OB/GYN Status:  Patient's last menstrual period was 10/29/2011.  General Assessment Data Location of Assessment: [facility] TTS Assessment: In system Is this a Tele or Face-to-Face Assessment?: Face-to-Face Is this an Initial Assessment or a Re-assessment for this encounter?: Initial Assessment Marital status: Single Is patient pregnant?: No Pregnancy Status: No Living Arrangements: Alone Can pt return to current living arrangement?: Yes Admission Status: Voluntary Is patient capable of signing voluntary  admission?: Yes Referral Source: Self/Family/Friend Insurance type: Self-pay     Crisis Care Plan Living Arrangements: Alone Legal Guardian:  (None) Name of Psychiatrist: None Name of Therapist: [facility]  Education Status Is patient currently in school?: No Highest grade of school patient has completed: Master's degree  Risk to self with the past 6 months Suicidal Ideation: No Has patient been a risk to self within the past 6 months prior to  admission? : No Suicidal Intent: No Has patient had any suicidal intent within the past 6 months prior to admission? : No Is patient at risk for suicide?: No Suicidal Plan?: No Has patient had any suicidal plan within the past 6 months prior to admission? : No Access to Means: No What has been your use of drugs/alcohol within the last 12 months?: Pt denied Previous Attempts/Gestures: No Triggers for Past Attempts: None known Recent stressful life event(s): Other (Comment) (Pt reports that she has been stalked by a man since 2006) Persecutory voices/beliefs?: No Depression: No Substance abuse history and/or treatment for substance abuse?: No Suicide prevention information given to non-admitted patients: Not applicable  Risk to Others within the past 6 months Homicidal Ideation: No Does patient have any lifetime risk of violence toward others beyond the six months prior to admission? : No Thoughts of Harm to Others: No Current Homicidal Intent: No Current Homicidal Plan: No Access to Homicidal Means: No History of harm to others?: No Assessment of Violence: None Noted Does patient have access to weapons?: No Criminal Charges Pending?: No Does patient have a court date: No Is patient on probation?: No  Psychosis Hallucinations: None noted Delusions: Persecutory  Mental Status Report Appearance/Hygiene: Disheveled Eye Contact: Poor Motor Activity: Freedom of movement, Restlessness Speech: Tangential Level of Consciousness: Alert, Crying Mood: Helpless, Fearful Affect: Fearful Anxiety Level: Severe Thought Processes: Tangential Judgement: Impaired Orientation: Person, Place, Time Obsessive Compulsive Thoughts/Behaviors: None  Cognitive Functioning Concentration: Fair Memory: Recent Impaired, Remote Impaired IQ: Average Insight: Poor Impulse Control: Fair Appetite: Fair Sleep: Decreased Total Hours of Sleep: 4 Vegetative Symptoms: None  ADLScreening [facility]  Assessment Services) Patient's cognitive ability adequate to safely complete daily activities?: Yes Patient able to express need for assistance with ADLs?: No Independently performs ADLs?: Yes (appropriate for developmental age)  Prior Inpatient Therapy Prior Inpatient Therapy: Yes Prior Therapy Dates: 2013, 2012 Prior Therapy Facilty/Provider(s): [facility], [place] Reason for Treatment: Delusional D/O; MDD  Prior Outpatient Therapy Prior Outpatient Therapy: Yes Prior Therapy Dates: Patient reports past services Prior Therapy Facilty/Provider(s): [facility] Reason for Treatment: med management, therapy Does patient have an ACCT team?: No Does patient have Intensive In-House Services?  : No Does patient have [facility] services? : No Does patient have P4CC services?: No  ADL Screening (condition at time of admission) Patient's cognitive ability adequate to safely complete daily activities?: Yes Is the patient deaf or have difficulty hearing?: No Does the patient have difficulty seeing, even when wearing glasses/contacts?: No Does the patient have difficulty concentrating, remembering, or making decisions?: Yes Patient able to express need for assistance with ADLs?: No Does the patient have difficulty dressing or bathing?: Yes Independently performs ADLs?: Yes (appropriate for developmental age) Does the patient have difficulty walking or climbing stairs?: No Weakness of Legs: None Weakness of Arms/Hands: None  Home Assistive Devices/Equipment Home Assistive Devices/Equipment: None  Therapy Consults (therapy consults require a physician order) PT Evaluation Needed: No OT Evalulation Needed: No SLP Evaluation Needed: No Abuse/Neglect Assessment (Assessment  to be complete while patient is alone) Physical Abuse: Denies Verbal Abuse: Denies Sexual Abuse: Denies Exploitation of patient/patient's resources: Denies Self-Neglect: Denies Values / Beliefs Cultural Requests  During Hospitalization: None Spiritual Requests During Hospitalization: None Consults Spiritual Care Consult Needed: No Social Work Consult Needed: No Regulatory affairs officer (For Healthcare) Does Patient Have a Medical Advance Directive?: No Would patient like information on creating a medical advance directive?: No - Patient declined    Additional Information 1:1 In Past 12 Months?: Yes CIRT Risk: Yes Elopement Risk: Yes Does patient have medical clearance?: Yes     Disposition:  Disposition Initial Assessment Completed for this Encounter: Yes Disposition of Patient: Inpatient treatment program Type of inpatient treatment program: Adult (Per Thedora Hinders, DNP, Pt meets inpt criteria)  On Site Evaluation by:   Reviewed with Physician:    Laurena Slimmer Kamdin Follett 07/22/2017 1:43 PM

## 2017-07-23 DIAGNOSIS — F22 Delusional disorders: Secondary | ICD-10-CM

## 2017-07-23 DIAGNOSIS — F4325 Adjustment disorder with mixed disturbance of emotions and conduct: Secondary | ICD-10-CM

## 2017-07-23 MED ORDER — DIPHENHYDRAMINE HCL 50 MG/ML IJ SOLN
50.0000 mg | Freq: Once | INTRAMUSCULAR | Status: AC
  Administered 2017-07-23: 50 mg via INTRAMUSCULAR
  Filled 2017-07-23: qty 1

## 2017-07-23 MED ORDER — LORAZEPAM 2 MG/ML IJ SOLN
2.0000 mg | Freq: Once | INTRAMUSCULAR | Status: AC
  Administered 2017-07-23: 2 mg via INTRAMUSCULAR
  Filled 2017-07-23: qty 1

## 2017-07-23 MED ORDER — HALOPERIDOL LACTATE 5 MG/ML IJ SOLN
5.0000 mg | Freq: Once | INTRAMUSCULAR | Status: AC
  Administered 2017-07-23: 5 mg via INTRAMUSCULAR
  Filled 2017-07-23: qty 1

## 2017-07-23 MED ORDER — ASENAPINE MALEATE 5 MG SL SUBL: 5.0000 mg | SUBLINGUAL_TABLET | Freq: Two times a day (BID) | SUBLINGUAL | 0 refills | Status: DC

## 2017-07-23 NOTE — ED Notes (Signed)
IM medication administered without resistance. Encouragement and support provided and safety maintain. Q 15 min safety checks remain in place and video monitoring.

## 2017-07-23 NOTE — BH Assessment (Signed)
[place] Assessment Progress Note  Per Corena Pilgrim, MD, this pt does not require psychiatric hospitalization at this time.  Pt presents under IVC initiated by EDP Virgel Manifold, MD, which Dr Darleene Cleaver has rescind.  Pt is to be discharged from [facility] with recommendation to continue treatment with [facility].  This has been included in pt's discharge instructions.  Pt's nurse, Caryl Pina, has been notified.  [name], [place] Triage Specialist [phone]

## 2017-07-23 NOTE — ED Notes (Addendum)
Pt d/c home per MD order. Discharge summary reviewed with pt, RX provided. Pt not endorsing SI/HI. Personal property returned to pt. Pt signed e-signature. Ambulatory off unit with MHT.

## 2017-07-23 NOTE — BHH Suicide Risk Assessment (Signed)
Suicide Risk Assessment  Discharge Assessment   [facility] Discharge Suicide Risk Assessment   Principal Problem: Adjustment disorder with mixed disturbance of emotions and conduct Discharge Diagnoses:  Patient Active Problem List   Diagnosis Date Noted  . Anxiety [F41.9] 03/29/2016    Priority: High  . Adjustment disorder with mixed disturbance of emotions and conduct [F43.25] 04/15/2015    Priority: High  . Suicidal ideations [R45.851] 03/12/2015    Priority: High  . MDD (major depressive disorder), recurrent severe, without psychosis ([place]) [F33.2] 11/18/2015  . Homelessness [Z59.0] 11/18/2015  . Tobacco use disorder [F17.200] 07/05/2015  . Malnutrition of moderate degree ([place]) [E44.0] 05/31/2015  . Hypokalemia [E87.6] 05/30/2015  . Obstipation [K59.00] 05/30/2015  . Prolonged QT interval [R94.31] 05/30/2015  . Hypertension [I10] 05/30/2015  . Diabetes ([place]) [E11.9] 09/06/2012  . Delusional disorder ([place]) [F22] 04/16/2012    Total Time spent with patient: 45 minutes  Musculoskeletal: Strength & Muscle Tone: within normal limits Gait & Station: normal Patient leans: N/A  Psychiatric Specialty Exam: Physical Exam  Constitutional: She is oriented to person, place, and time. She appears well-developed and well-nourished.  HENT:  Head: Normocephalic.  Neck: Normal range of motion.  Respiratory: Effort normal.  Musculoskeletal: Normal range of motion.  Neurological: She is alert and oriented to person, place, and time.  Psychiatric: She has a normal mood and affect. Her speech is normal and behavior is normal. Judgment and thought content normal. Cognition and memory are normal.    Review of Systems  All other systems reviewed and are negative.   Blood pressure (!) 99/48, pulse (!) 58, temperature 98.4 F (36.9 C), temperature source Oral, resp. rate 18, last menstrual period 10/29/2011, SpO2 99 %.There is no height or weight on file to calculate BMI.  General Appearance:  Casual  Eye Contact:  Good  Speech:  Normal Rate  Volume:  Normal  Mood:  Euthymic  Affect:  Congruent  Thought Process:  Coherent and Descriptions of Associations: Intact  Orientation:  Full (Time, Place, and Person)  Thought Content:  WDL and Logical  Suicidal Thoughts:  No  Homicidal Thoughts:  No  Memory:  Immediate;   Good Recent;   Good Remote;   Good  Judgement:  Fair  Insight:  Fair  Psychomotor Activity:  Normal  Concentration:  Concentration: Good and Attention Span: Good  Recall:  Good  Fund of Knowledge:  Good  Language:  Good  Akathisia:  No  Handed:  Right  AIMS (if indicated):     Assets:  Housing Leisure Time Physical Health Resilience  ADL's:  Intact  Cognition:  WNL  Sleep:       Mental Status Per Nursing Assessment::   On Admission:   paranoia   Demographic Factors:  Living alone  Loss Factors: NA  Historical Factors: NA  Risk Reduction Factors:   Sense of responsibility to family and Positive social support  Continued Clinical Symptoms:  NOne  Cognitive Features That Contribute To Risk:  None    Suicide Risk:  Minimal: No identifiable suicidal ideation.  Patients presenting with no risk factors but with morbid ruminations; may be classified as minimal risk based on the severity of the depressive symptoms    Plan Of Care/Follow-up recommendations:  Activity:  as tolerated Diet:  heart healthy diet  Jayde Daffin, NP 07/23/2017, 10:30 AM

## 2017-07-23 NOTE — ED Notes (Signed)
Patient pacing around in room yelling, crying and beating her head with her fist. Writer tried to redirect patient but was unable to. Patriciaann Clan, PA contacted and informed of patient behavior. Verbal orders received for Haldol 5 mg IM, Ativan 2 mg IM and Benadryl 50 mg IM orders read back and verified. Encouragement and support provided and safety maintain. Q 15 min safety check remain in place and video monitoring.

## 2017-07-23 NOTE — Discharge Instructions (Signed)
For your ongoing behavioral health needs you are advised to continue treatment with [facility].  If you do not currently have an appointment, new patients are seen at their walk-in clinic.  Walk-in hours are Monday - Friday from 8:00 am - 12:00 pm, and from 1:00 pm - 3:00 pm.  Walk-in patients are seen on a first come, first served basis, so try to arrive as early as possible for the best chance of being seen the same day.  There is an initial fee of $22.50:       [facility], [place] [address]      [phone]

## 2017-07-23 NOTE — Consult Note (Signed)
[facility] Face-to-Face Psychiatry Consult   Reason for Consult:  Paranoia Referring Physician:  EDP Patient Identification: [name] MRN:  [id number] Principal Diagnosis: Adjustment disorder with mixed disturbance of emotions and conduct Diagnosis:   Patient Active Problem List   Diagnosis Date Noted  . Anxiety [F41.9] 03/29/2016    Priority: High  . Adjustment disorder with mixed disturbance of emotions and conduct [F43.25] 04/15/2015    Priority: High  . Suicidal ideations [R45.851] 03/12/2015    Priority: High  . MDD (major depressive disorder), recurrent severe, without psychosis ([place]) [F33.2] 11/18/2015  . Homelessness [Z59.0] 11/18/2015  . Tobacco use disorder [F17.200] 07/05/2015  . Malnutrition of moderate degree ([place]) [E44.0] 05/31/2015  . Hypokalemia [E87.6] 05/30/2015  . Obstipation [K59.00] 05/30/2015  . Prolonged QT interval [R94.31] 05/30/2015  . Hypertension [I10] 05/30/2015  . Diabetes ([place]) [E11.9] 09/06/2012  . Delusional disorder ([place]) [F22] 04/16/2012    Total Time spent with patient: 45 minutes  Subjective:   [name] is a 51 y.o. female patient does not warrant admission  HPI:  51 yo female who presented to the ED with paranoia after not sleeping well.  She slept last night and is clear and coherent with no paranoia.  No suicidal/homicidal ideations, hallucinations, or substance abuse.  She is well known to this ED and providers and is at her baseline, stable for discharge.  Past Psychiatric History: depression, anxiety  Risk to Self: Suicidal Ideation: No Suicidal Intent: No Is patient at risk for suicide?: No Suicidal Plan?: No Access to Means: No What has been your use of drugs/alcohol within the last 12 months?: Pt denied Triggers for Past Attempts: None known Risk to Others: Homicidal Ideation: No Thoughts of Harm to Others: No Current Homicidal Intent: No Current Homicidal Plan: No Access to Homicidal Means: No History of harm to  others?: No Assessment of Violence: None Noted Does patient have access to weapons?: No Criminal Charges Pending?: No Does patient have a court date: No Prior Inpatient Therapy: Prior Inpatient Therapy: Yes Prior Therapy Dates: 2013, 2012 Prior Therapy Facilty/Provider(s): [facility], [place] Reason for Treatment: Delusional D/O; MDD Prior Outpatient Therapy: Prior Outpatient Therapy: Yes Prior Therapy Dates: Patient reports past services Prior Therapy Facilty/Provider(s): [facility] Reason for Treatment: med management, therapy Does patient have an ACCT team?: No Does patient have Intensive In-House Services?  : No Does patient have [facility] services? : No Does patient have P4CC services?: No  Past Medical History:  Past Medical History:  Diagnosis Date  . Anxiety   . Diabetes mellitus without complication ([place])   . Hypertension   . Major depressive disorder, recurrent, severe with psychotic features ([place])    History reviewed. No pertinent surgical history. Family History:  Family History  Problem Relation Age of Onset  . Hypertension Mother   . Diabetes Mother   . Aneurysm Mother   . Hypertension Father   . Diabetes Father   . Hypertension Sister   . Diabetes Sister   . Hypertension Other   . Diabetes Other    Family Psychiatric  History: unknown Social History:  History  Alcohol Use No     History  Drug Use No    Social History   Social History  . Marital status: Single    Spouse name: N/A  . Number of children: N/A  . Years of education: N/A   Social History Main Topics  . Smoking status: Never Smoker  . Smokeless tobacco: Never Used  . Alcohol  use No  . Drug use: No  . Sexual activity: No   Other Topics Concern  . None   Social History Narrative  . None   Additional Social History:    Allergies:  No Known Allergies  Labs:  Results for orders placed or performed during the hospital encounter of 07/22/17 (from the past 48 hour(s))   Ethanol     Status: None   Collection Time: 07/22/17 11:33 AM  Result Value Ref Range   Alcohol, Ethyl (B) <10 <10 mg/dL    Comment:        LOWEST DETECTABLE LIMIT FOR SERUM ALCOHOL IS 10 mg/dL FOR MEDICAL PURPOSES ONLY Please note change in reference range.   Salicylate level     Status: None   Collection Time: 07/22/17 11:33 AM  Result Value Ref Range   Salicylate Lvl <8.6 2.8 - 30.0 mg/dL  Acetaminophen level     Status: Abnormal   Collection Time: 07/22/17 11:33 AM  Result Value Ref Range   Acetaminophen (Tylenol), Serum <10 (L) 10 - 30 ug/mL    Comment:        THERAPEUTIC CONCENTRATIONS VARY SIGNIFICANTLY. A RANGE OF 10-30 ug/mL MAY BE AN EFFECTIVE CONCENTRATION FOR MANY PATIENTS. HOWEVER, SOME ARE BEST TREATED AT CONCENTRATIONS OUTSIDE THIS RANGE. ACETAMINOPHEN CONCENTRATIONS >150 ug/mL AT 4 HOURS AFTER INGESTION AND >50 ug/mL AT 12 HOURS AFTER INGESTION ARE OFTEN ASSOCIATED WITH TOXIC REACTIONS.   Rapid urine drug screen (hospital performed)     Status: None   Collection Time: 07/22/17 11:33 AM  Result Value Ref Range   Opiates NONE DETECTED NONE DETECTED   Cocaine NONE DETECTED NONE DETECTED   Benzodiazepines NONE DETECTED NONE DETECTED   Amphetamines NONE DETECTED NONE DETECTED   Tetrahydrocannabinol NONE DETECTED NONE DETECTED   Barbiturates NONE DETECTED NONE DETECTED    Comment:        DRUG SCREEN FOR MEDICAL PURPOSES ONLY.  IF CONFIRMATION IS NEEDED FOR ANY PURPOSE, NOTIFY LAB WITHIN 5 DAYS.        LOWEST DETECTABLE LIMITS FOR URINE DRUG SCREEN Drug Class       Cutoff (ng/mL) Amphetamine      1000 Barbiturate      200 Benzodiazepine   761 Tricyclics       950 Opiates          300 Cocaine          300 THC              50   Comprehensive metabolic panel     Status: Abnormal   Collection Time: 07/22/17 12:10 PM  Result Value Ref Range   Sodium 142 135 - 145 mmol/L   Potassium 4.2 3.5 - 5.1 mmol/L   Chloride 109 101 - 111 mmol/L   CO2 25 22  - 32 mmol/L   Glucose, Bld 100 (H) 65 - 99 mg/dL   BUN 28 (H) 6 - 20 mg/dL   Creatinine, Ser 0.86 0.44 - 1.00 mg/dL   Calcium 9.3 8.9 - 10.3 mg/dL   Total Protein 7.6 6.5 - 8.1 g/dL   Albumin 4.1 3.5 - 5.0 g/dL   AST 22 15 - 41 U/L   ALT 21 14 - 54 U/L   Alkaline Phosphatase 132 (H) 38 - 126 U/L   Total Bilirubin 0.4 0.3 - 1.2 mg/dL   GFR calc non Af Amer >60 >60 mL/min   GFR calc Af Amer >60 >60 mL/min    Comment: (NOTE) The eGFR has  been calculated using the CKD EPI equation. This calculation has not been validated in all clinical situations. eGFR's persistently <60 mL/min signify possible Chronic Kidney Disease.    Anion gap 8 5 - 15  cbc     Status: Abnormal   Collection Time: 07/22/17 12:10 PM  Result Value Ref Range   WBC 4.9 4.0 - 10.5 K/uL   RBC 4.60 3.87 - 5.11 MIL/uL   Hemoglobin 11.6 (L) 12.0 - 15.0 g/dL   HCT 36.8 36.0 - 46.0 %   MCV 80.0 78.0 - 100.0 fL   MCH 25.2 (L) 26.0 - 34.0 pg   MCHC 31.5 30.0 - 36.0 g/dL   RDW 14.2 11.5 - 15.5 %   Platelets 241 150 - 400 K/uL  I-Stat beta hCG blood, ED     Status: None   Collection Time: 07/22/17  3:59 PM  Result Value Ref Range   I-stat hCG, quantitative <5.0 <5 mIU/mL   Comment 3            Comment:   GEST. AGE      CONC.  (mIU/mL)   <=1 WEEK        5 - 50     2 WEEKS       50 - 500     3 WEEKS       100 - 10,000     4 WEEKS     1,000 - 30,000        FEMALE AND NON-PREGNANT FEMALE:     LESS THAN 5 mIU/mL     Current Facility-Administered Medications  Medication Dose Route Frequency Provider Last Rate Last Dose  . asenapine (SAPHRIS) sublingual tablet 10 mg  10 mg Sublingual BID Patrecia Pour, NP   10 mg at 07/23/17 1013   No current outpatient prescriptions on file.    Musculoskeletal: Strength & Muscle Tone: within normal limits Gait & Station: normal Patient leans: N/A  Psychiatric Specialty Exam: Physical Exam  Constitutional: She is oriented to person, place, and time. She appears well-developed  and well-nourished.  HENT:  Head: Normocephalic.  Neck: Normal range of motion.  Respiratory: Effort normal.  Musculoskeletal: Normal range of motion.  Neurological: She is alert and oriented to person, place, and time.  Psychiatric: She has a normal mood and affect. Her speech is normal and behavior is normal. Judgment and thought content normal. Cognition and memory are normal.    Review of Systems  All other systems reviewed and are negative.   Blood pressure (!) 99/48, pulse (!) 58, temperature 98.4 F (36.9 C), temperature source Oral, resp. rate 18, last menstrual period 10/29/2011, SpO2 99 %.There is no height or weight on file to calculate BMI.  General Appearance: Casual  Eye Contact:  Good  Speech:  Normal Rate  Volume:  Normal  Mood:  Euthymic  Affect:  Congruent  Thought Process:  Coherent and Descriptions of Associations: Intact  Orientation:  Full (Time, Place, and Person)  Thought Content:  WDL and Logical  Suicidal Thoughts:  No  Homicidal Thoughts:  No  Memory:  Immediate;   Good Recent;   Good Remote;   Good  Judgement:  Fair  Insight:  Fair  Psychomotor Activity:  Normal  Concentration:  Concentration: Good and Attention Span: Good  Recall:  Good  Fund of Knowledge:  Good  Language:  Good  Akathisia:  No  Handed:  Right  AIMS (if indicated):     Assets:  Housing Leisure Time Physical  Health Resilience  ADL's:  Intact  Cognition:  WNL  Sleep:        Treatment Plan Summary: Daily contact with patient to assess and evaluate symptoms and progress in treatment, Medication management and Plan adjusment disorder with mixed disturbance of emotions and conduct:  -Crisis stabilization -Medication management:  Saphris 10 mg BID for paranoia/psychosis -Individual counseling  Disposition: No evidence of imminent risk to self or others at present.    Waylan Boga, NP 07/23/2017 10:25 AM  Patient seen face-to-face for psychiatric evaluation, chart  reviewed and case discussed with the physician extender and developed treatment plan. Reviewed the information documented and agree with the treatment plan. Corena Pilgrim, MD

## 2017-07-24 IMAGING — CT CT ANGIO HEAD
4 of 10 series · 16 of 47 positions shown · IV contrast (omnipaque)
Comparison: CT without contrast 08/22/2010.

CLINICAL DATA: Sudden onset of headache and syncope.

EXAM:
CT ANGIOGRAPHY HEAD
TECHNIQUE: Multidetector CT imaging of the head was performed using the
standard protocol during bolus administration of intravenous
contrast. Multiplanar CT image reconstructions and MIPs were
obtained to evaluate the vascular anatomy.
CONTRAST:  100mL OMNIPAQUE IOHEXOL 350 MG/ML SOLN

[Series 8: headangio 2.0 h31f · axial · 0.40mm/px · z∈[-31,+65]mm · 5 of 74 slices shown]
[im 13/74  brain]
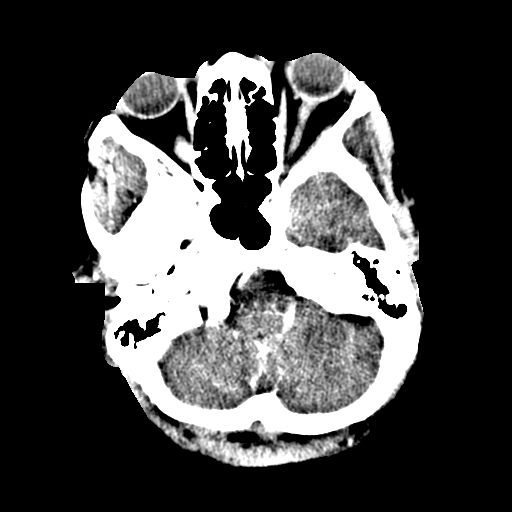
[im 25/74  bone]
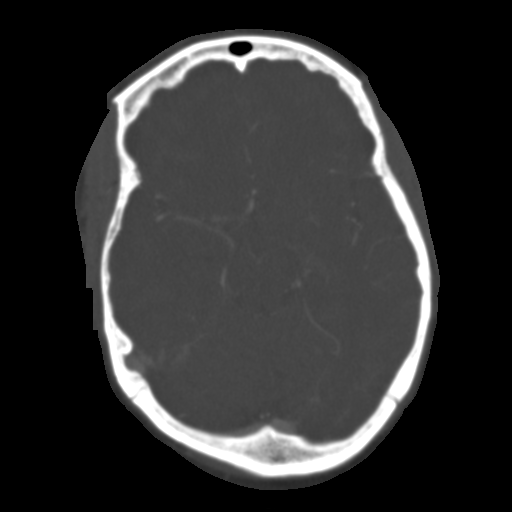
[im 37/74  brain]
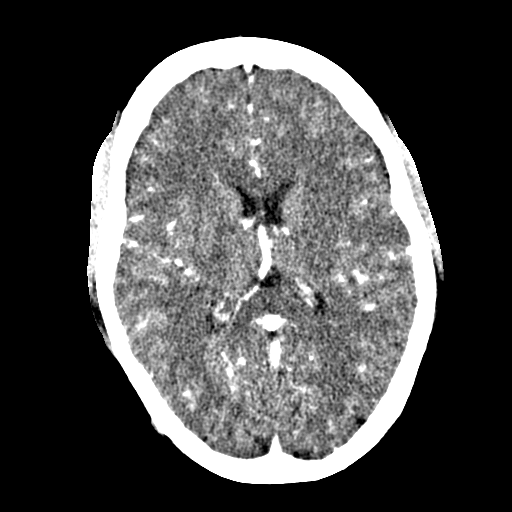
[im 49/74  bone]
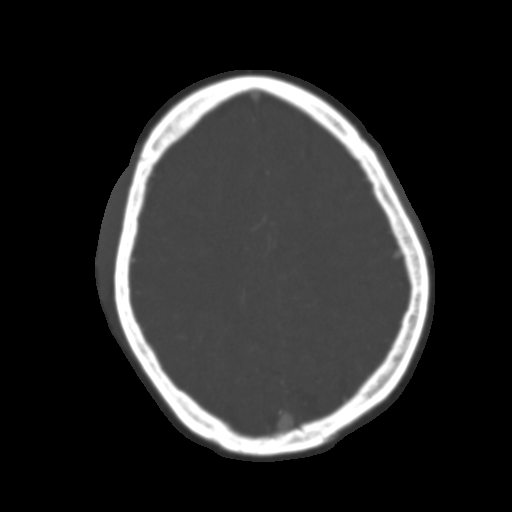
[im 61/74  brain]
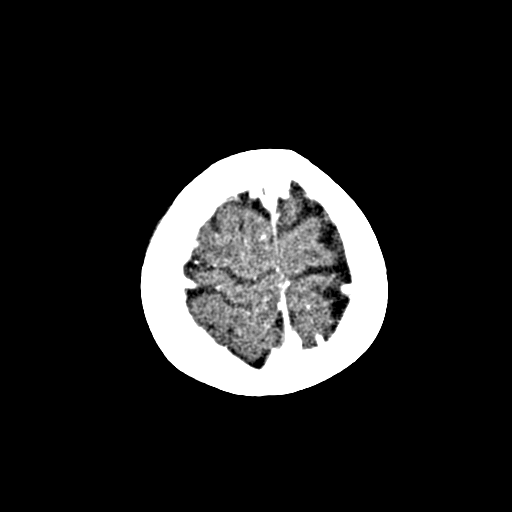

[Series 9: axial · axial · 0.39mm/px · z∈[-20,+36]mm · 5 of 126 slices shown]
[im 12/126  brain]
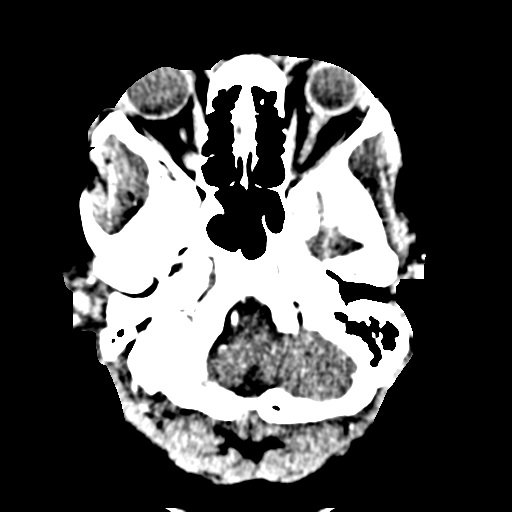
[im 23/126  brain]
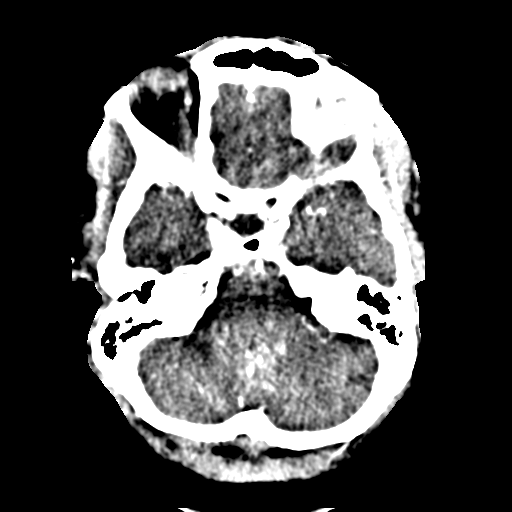
[im 46/126  brain]
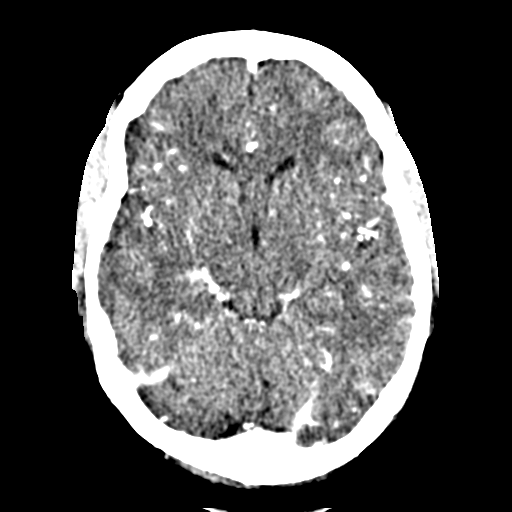
[im 57/126  brain]
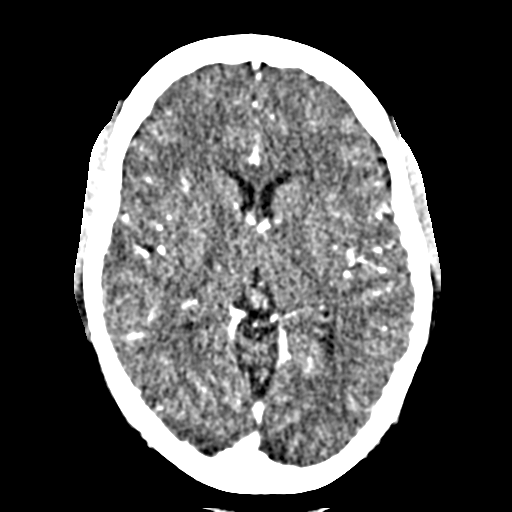
[im 69/126  brain]
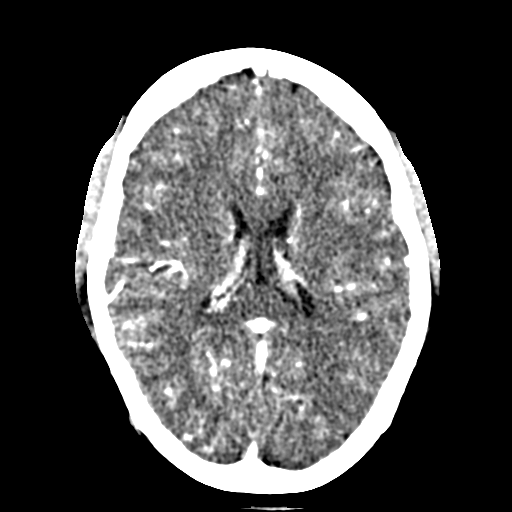

[Series 10: coronal · coronal · 0.25mm/px · 3 of 200 slices shown]
[im 57/200  brain]
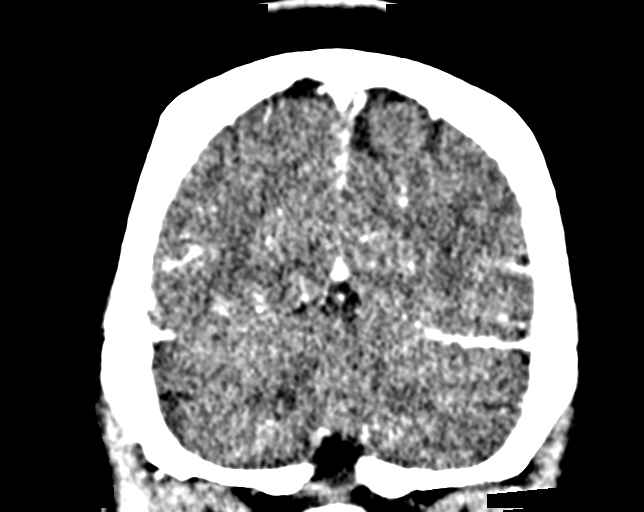
[im 86/200  brain]
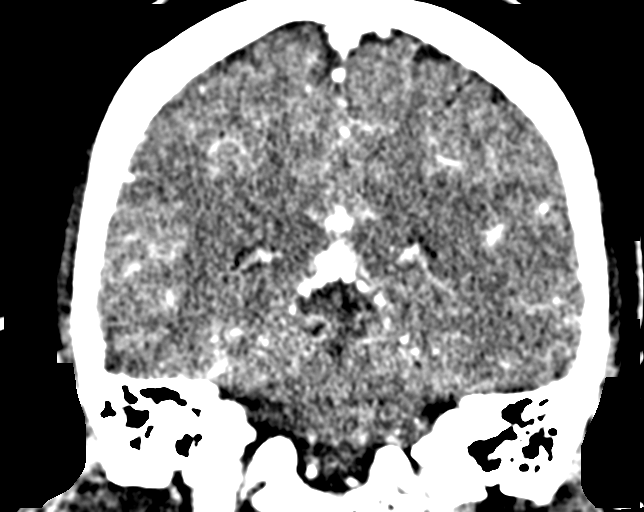
[im 114/200  brain]
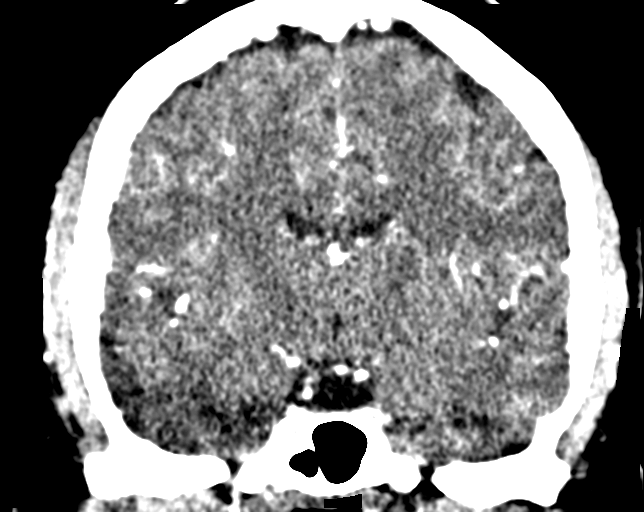

[Series 11: sagittal · sagittal · 0.23mm/px · 3 of 150 slices shown]
[im 30/150  brain]
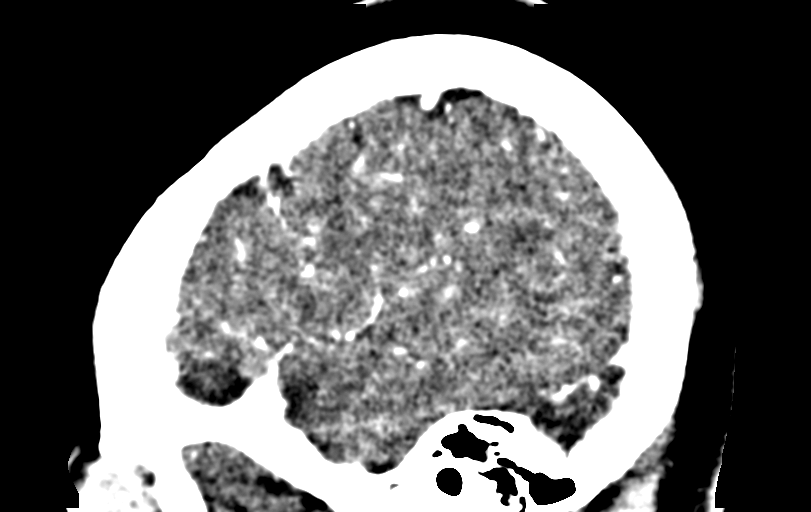
[im 60/150  brain]
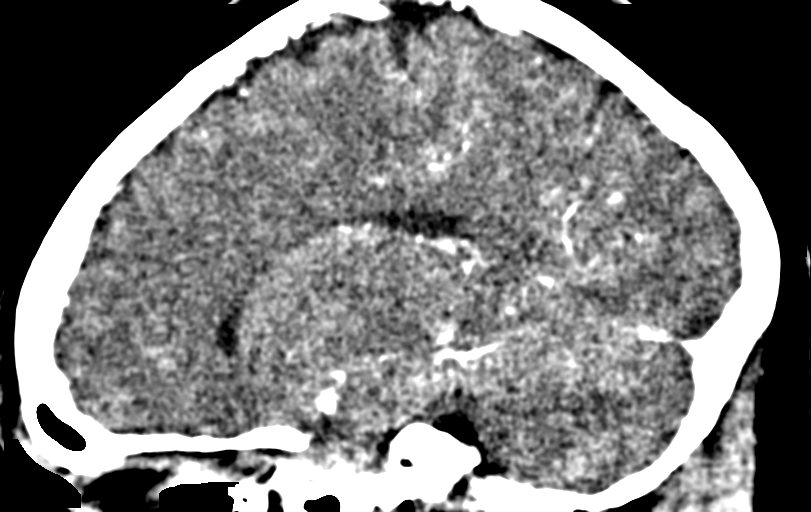
[im 90/150  brain]
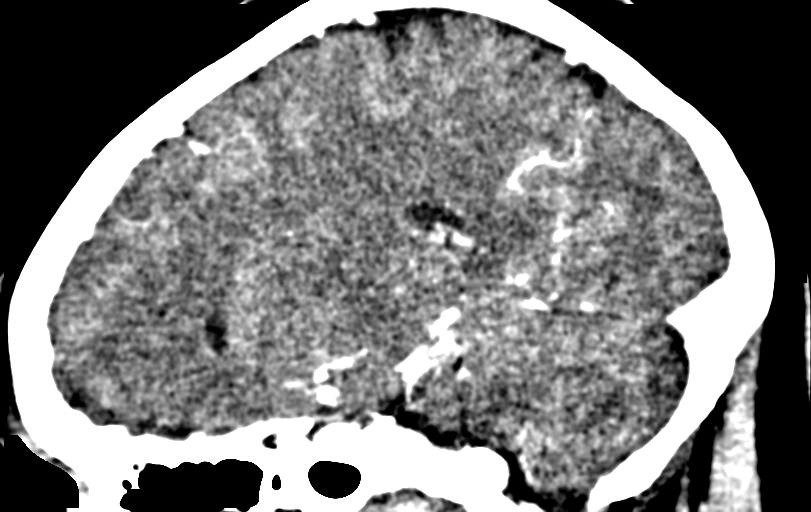

[16 of 47 positions shown; findings below may reference images not displayed]

FINDINGS: CT HEAD

Brain: No acute infarct, hemorrhage, or mass lesion is present. The
ventricles are of normal size. No significant extraaxial fluid
collection is present.

Calvarium and skull base: Within normal limits.

Paranasal sinuses: Clear

Orbits: Within normal limits

CTA HEAD

Anterior circulation: The internal carotid arteries are within
normal limits to the ICA termini bilaterally. A1 and M1 segments are
within normal limits. The MCA bifurcations are intact. There is some
attenuation of distal MCA branch vessels without significant
proximal stenosis or occlusion.

Posterior circulation: New the right vertebral artery is the
dominant vessel. The PICA origins are visualized and normal
bilaterally. The basilar artery is small. The posterior cerebral
arteries originate from the basilar tip and posterior communicating
arteries bilaterally. There is mild narrowing of proximal left
posterior cerebral artery. PCA branch vessels are intact with some
attenuation of distal vessels bilaterally.

Venous sinuses: The dural sinuses are patent. Straight sinus and
deep cerebral veins are intact. Cortical veins are within normal
limits.

Anatomic variants: None

Delayed phase:The postcontrast images demonstrate no pathologic
enhancement.
IMPRESSION: 1. Mild distal small vessel disease.
2. No significant proximal stenosis, aneurysm, or branch vessel
occlusion.
3. Normal CT appearance of the brain.

## 2017-07-25 ENCOUNTER — Emergency Department (HOSPITAL_COMMUNITY)
Admission: EM | Admit: 2017-07-25 | Discharge: 2017-07-26 | Disposition: A | Discharge status: Home / Self Care | Payer: Self-pay | Attending: Emergency Medicine | Admitting: Emergency Medicine

## 2017-07-25 ENCOUNTER — Encounter (HOSPITAL_COMMUNITY): Payer: Self-pay | Admitting: Emergency Medicine

## 2017-07-25 DIAGNOSIS — F22 Delusional disorders: Secondary | ICD-10-CM | POA: Diagnosis present

## 2017-07-25 DIAGNOSIS — R450 Nervousness: Secondary | ICD-10-CM | POA: Insufficient documentation

## 2017-07-25 DIAGNOSIS — E119 Type 2 diabetes mellitus without complications: Secondary | ICD-10-CM | POA: Insufficient documentation

## 2017-07-25 DIAGNOSIS — I10 Essential (primary) hypertension: Secondary | ICD-10-CM | POA: Insufficient documentation

## 2017-07-25 DIAGNOSIS — Z046 Encounter for general psychiatric examination, requested by authority: Secondary | ICD-10-CM | POA: Insufficient documentation

## 2017-07-25 NOTE — ED Triage Notes (Signed)
Brought in by [employer] from home pt states she is afraid that someone is following her in [place], bus station and at her home. Pt states she filed a harrassment case and she felt she was stalking by this person.

## 2017-07-26 DIAGNOSIS — R451 Restlessness and agitation: Secondary | ICD-10-CM

## 2017-07-26 DIAGNOSIS — F419 Anxiety disorder, unspecified: Secondary | ICD-10-CM

## 2017-07-26 DIAGNOSIS — F22 Delusional disorders: Secondary | ICD-10-CM

## 2017-07-26 DIAGNOSIS — R4587 Impulsiveness: Secondary | ICD-10-CM

## 2017-07-26 LAB — BASIC METABOLIC PANEL
ANION GAP: 9 (ref 5–15)
BUN: 24 mg/dL — AB (ref 6–20)
CHLORIDE: 106 mmol/L (ref 101–111)
CO2: 24 mmol/L (ref 22–32)
Calcium: 9.3 mg/dL (ref 8.9–10.3)
Creatinine, Ser: 1.36 mg/dL — ABNORMAL HIGH (ref 0.44–1.00)
GFR calc Af Amer: 52 mL/min — ABNORMAL LOW (ref >60)
GFR calc non Af Amer: 45 mL/min — ABNORMAL LOW (ref >60)
GLUCOSE: 107 mg/dL — AB (ref 65–99)
POTASSIUM: 3.8 mmol/L (ref 3.5–5.1)
SODIUM: 139 mmol/L (ref 135–145)

## 2017-07-26 LAB — CBC WITH DIFFERENTIAL/PLATELET
Basophils Absolute: 0 10*3/uL (ref 0.0–0.1)
Basophils Relative: 0 %
EOS PCT: 3 %
Eosinophils Absolute: 0.2 10*3/uL (ref 0.0–0.7)
HEMATOCRIT: 36.3 % (ref 36.0–46.0)
Hemoglobin: 11.4 g/dL — ABNORMAL LOW (ref 12.0–15.0)
LYMPHS ABS: 2.8 10*3/uL (ref 0.7–4.0)
LYMPHS PCT: 40 %
MCH: 25.3 pg — AB (ref 26.0–34.0)
MCHC: 31.4 g/dL (ref 30.0–36.0)
MCV: 80.7 fL (ref 78.0–100.0)
MONO ABS: 0.3 10*3/uL (ref 0.1–1.0)
Monocytes Relative: 5 %
NEUTROS ABS: 3.6 10*3/uL (ref 1.7–7.7)
Neutrophils Relative %: 52 %
PLATELETS: 286 10*3/uL (ref 150–400)
RBC: 4.5 MIL/uL (ref 3.87–5.11)
RDW: 14.2 % (ref 11.5–15.5)
WBC: 7 10*3/uL (ref 4.0–10.5)

## 2017-07-26 LAB — URINALYSIS, ROUTINE W REFLEX MICROSCOPIC
Bilirubin Urine: NEGATIVE
Glucose, UA: NEGATIVE mg/dL
Hgb urine dipstick: NEGATIVE
Ketones, ur: NEGATIVE mg/dL
NITRITE: NEGATIVE
Protein, ur: NEGATIVE mg/dL
SPECIFIC GRAVITY, URINE: 1.03 (ref 1.005–1.030)
pH: 5 (ref 5.0–8.0)

## 2017-07-26 LAB — RAPID URINE DRUG SCREEN, HOSP PERFORMED
AMPHETAMINES: NOT DETECTED
Barbiturates: NOT DETECTED
Benzodiazepines: NOT DETECTED
Cocaine: NOT DETECTED
OPIATES: NOT DETECTED
TETRAHYDROCANNABINOL: NOT DETECTED

## 2017-07-26 LAB — ETHANOL: Alcohol, Ethyl (B): <10 mg/dL (ref <10)

## 2017-07-26 MED ORDER — ASENAPINE MALEATE 5 MG SL SUBL
5.0000 mg | SUBLINGUAL_TABLET | Freq: Two times a day (BID) | SUBLINGUAL | Status: DC
  Filled 2017-07-26: qty 1

## 2017-07-26 NOTE — BH Assessment (Signed)
[facility] Assessment Progress Note   Pt is to be monitored overnight and re-eval in AM for safety measures due to delusional thoughts and current AMS per Lindon Romp, NP. EDP Dr. Randal Buba, MD and Junior, RN notified of recommendation.   Lind Covert, MSW, LCSW Therapeutic Triage Specialist  [phone]

## 2017-07-26 NOTE — ED Provider Notes (Signed)
[facility] DEPT Provider Note   CSN: [id number] Arrival date & time: 07/25/17  2321     History   Chief Complaint Chief Complaint  Patient presents with  . Paranoid    HPI Rael Tilly is a 51 y.o. female.  The history is provided by the patient and medical records. No language interpreter was used.   Odella Appelhans is a 51 y.o. female  with a PMH of DM, HTN, anxiety, adjustment disorder, delusional disorder who presents to the Emergency Department by [employer] from home for possible paranoia. Patient states that someone has been following her and her resting her for several years now. She believes they are people from her "jerked". She saw a man over the last week and contacted [employer] to try to make harassment charges. She states that the police came to her house today when called for this. She stated that police told her there was not much they could do about this and it would need to be "escalated to higher authorities". She states that she didn't call the police back because she was afraid to stay at her home alone. When they arrived, they brought her to the ED. Patient denies taking any medications daily, stating that the hospital had given her a prescription before, but she didn't get it filled. No other complaints. Denies SI/HI.   Past Medical History:  Diagnosis Date  . Anxiety   . Diabetes mellitus without complication ([place])   . Hypertension   . Major depressive disorder, recurrent, severe with psychotic features [facility])     Patient Active Problem List   Diagnosis Date Noted  . Anxiety 03/29/2016  . MDD (major depressive disorder), recurrent severe, without psychosis ([place]) 11/18/2015  . Homelessness 11/18/2015  . Tobacco use disorder 07/05/2015  . Malnutrition of moderate degree ([place]) 05/31/2015  . Hypokalemia 05/30/2015  . Obstipation 05/30/2015  . Prolonged QT interval 05/30/2015  . Hypertension 05/30/2015  . Adjustment disorder with mixed disturbance of emotions and  conduct 04/15/2015  . Suicidal ideations 03/12/2015  . Diabetes ([place]) 09/06/2012  . Delusional disorder ([place]) 04/16/2012    History reviewed. No pertinent surgical history.  OB History    No data available       Home Medications    Prior to Admission medications   Medication Sig Start Date End Date Taking? Authorizing Provider  asenapine (SAPHRIS) 5 MG SUBL 24 hr tablet Place 1 tablet (5 mg total) under the tongue 2 (two) times daily. 07/23/17  Yes Patrecia Pour, NP    Family History Family History  Problem Relation Age of Onset  . Hypertension Mother   . Diabetes Mother   . Aneurysm Mother   . Hypertension Father   . Diabetes Father   . Hypertension Sister   . Diabetes Sister   . Hypertension Other   . Diabetes Other     Social History Social History  Substance Use Topics  . Smoking status: Never Smoker  . Smokeless tobacco: Never Used  . Alcohol use No     Allergies   Patient has no known allergies.   Review of Systems Review of Systems  Psychiatric/Behavioral: The patient is nervous/anxious.   All other systems reviewed and are negative.    Physical Exam Updated Vital Signs LMP 10/29/2011 Comment: NEG U PREG 02/24/17  Physical Exam  Constitutional: She is oriented to person, place, and time. She appears well-developed and well-nourished. No distress.  HENT:  Head: Normocephalic and atraumatic.  Cardiovascular: Normal rate, regular rhythm  and normal heart sounds.   No murmur heard. Pulmonary/Chest: Effort normal and breath sounds normal. No respiratory distress.  Abdominal: Soft. She exhibits no distension. There is no tenderness.  Musculoskeletal: Normal range of motion.  Neurological: She is alert and oriented to person, place, and time.  Skin: Skin is warm and dry.  Nursing note and vitals reviewed.    ED Treatments / Results  Labs (all labs ordered are listed, but only abnormal results are displayed) Labs Reviewed  URINALYSIS, ROUTINE  W REFLEX MICROSCOPIC - Abnormal; Notable for the following:       Result Value   Leukocytes, UA TRACE (*)    Bacteria, UA RARE (*)    Squamous Epithelial / LPF 0-5 (*)    All other components within normal limits  BASIC METABOLIC PANEL - Abnormal; Notable for the following:    Glucose, Bld 107 (*)    BUN 24 (*)    Creatinine, Ser 1.36 (*)    GFR calc non Af Amer 45 (*)    GFR calc Af Amer 52 (*)    All other components within normal limits  CBC WITH DIFFERENTIAL/PLATELET - Abnormal; Notable for the following:    Hemoglobin 11.4 (*)    MCH 25.3 (*)    All other components within normal limits  ETHANOL  RAPID URINE DRUG SCREEN, HOSP PERFORMED    EKG  EKG Interpretation None       Radiology No results found.  Procedures Procedures (including critical care time)  Medications Ordered in ED Medications  asenapine (SAPHRIS) sublingual tablet 5 mg (not administered)     Initial Impression / Assessment and Plan / ED Course  I have reviewed the triage vital signs and the nursing notes.  Pertinent labs & imaging results that were available during my care of the patient were reviewed by me and considered in my medical decision making (see chart for details).    Myha Arizpe is a 51 y.o. female who presents to ED by [employer] for paranoia. Labs reviewed. Medically cleared with disposition per TTS.    Final Clinical Impressions(s) / ED Diagnoses   Final diagnoses:  Paranoia [facility])    New Prescriptions New Prescriptions   No medications on file     Nastasha Reising, Ozella Almond, PA-C 07/26/17 0263    Palumbo, April, MD 07/26/17 0501

## 2017-07-26 NOTE — Progress Notes (Signed)
Pt refusing to e-sign discharge instructions. Pt refusing discharge, refusing to leave. Trespassing, escorted with [employer] and security.

## 2017-07-26 NOTE — Discharge Instructions (Signed)
Follow up with:  [facility] [address]  [place], [place] [address]   Phone: [phone]

## 2017-07-26 NOTE — BH Assessment (Addendum)
Assessment Note  [name] is an 51 y.o. female who presents to the ED voluntarily due to increased paranoia and delusional thoughts. Pt believes that she is being stalked and harassed by 2 men since 2006. Pt states she filed a Biochemist, clinical in 2006 and she does not understand why the men have not yet been arrested. Pt has multiple ED visits c/o the same concerns. Pt has a hx of coming to the ED reporting she is being bullied and stalked by gentleman and not feeling safe at home.   Pt's most recent evaluation by TTS was conducted on 07/22/17 and at that time was recommended for inpt treatment. Pt was d/c on 07/23/17 after  stabilizing in the ED after an evaluation by psych. Pt was recommended for d/c to follow up with her primary [facility]. Pt returned to the ED on 07/25/17 and stated to this writer she did not follow up with her current provider. Pt reports she was on the bus today and saw the gentlemen that are stalking her and became afraid and called the police. Pt reports she has a Paediatric nurse" pending against the men that are stalking her. Pt reports she has got the FBI involved and continued to express that she does not understand why the gentlemen have not been arrested.   Pt denies SI, HI, and denies AVH at present. Pt denies SA hx or treatment. Pt reports she does not sleep because she is constantly worried about the men that are stalking her.   Pt is to be monitored overnight and re-eval in AM for safety measures due to delusional thoughts and current AMS per Lindon Romp, NP. EDP Dr. Randal Buba, MD and Junior, RN notified of recommendation.   Diagnosis: Delusional Disorder; GAD   Past Medical History:  Past Medical History:  Diagnosis Date  . Anxiety   . Diabetes mellitus without complication ([place])   . Hypertension   . Major depressive disorder, recurrent, severe with psychotic features ([place])     History reviewed. No pertinent surgical  history.  Family History:  Family History  Problem Relation Age of Onset  . Hypertension Mother   . Diabetes Mother   . Aneurysm Mother   . Hypertension Father   . Diabetes Father   . Hypertension Sister   . Diabetes Sister   . Hypertension Other   . Diabetes Other     Social History:  reports that she has never smoked. She has never used smokeless tobacco. She reports that she does not drink alcohol or use drugs.  Additional Social History:  Alcohol / Drug Use Pain Medications: See MAR Prescriptions: See MAR Over the Counter: See MAR History of alcohol / drug use?: No history of alcohol / drug abuse  CIWA:   COWS:    Allergies: No Known Allergies  Home Medications:  (Not in a hospital admission)  OB/GYN Status:  Patient's last menstrual period was 10/29/2011.  General Assessment Data Location of Assessment: [facility] ED TTS Assessment: In system Is this a Tele or Face-to-Face Assessment?: Face-to-Face Is this an Initial Assessment or a Re-assessment for this encounter?: Initial Assessment Marital status: Single Is patient pregnant?: No Pregnancy Status: No Living Arrangements: Alone Can pt return to current living arrangement?: Yes Admission Status: Voluntary Is patient capable of signing voluntary admission?: Yes Referral Source: Self/Family/Friend Insurance type: none on file      Crisis Care Plan Living Arrangements: Alone Name of Psychiatrist: [facility]  Name of Therapist: [facility]  Education Status Is patient currently in school?: No Highest grade of school patient has completed: pt reports she has 2 Master's Degrees  Risk to self with the past 6 months Suicidal Ideation: No Has patient been a risk to self within the past 6 months prior to admission? : No Suicidal Intent: No Has patient had any suicidal intent within the past 6 months prior to admission? : No Is patient at risk for suicide?: No Suicidal  Plan?: No Has patient had any suicidal plan within the past 6 months prior to admission? : No Access to Means: No What has been your use of drugs/alcohol within the last 12 months?: denies use  Previous Attempts/Gestures: No Triggers for Past Attempts: None known Intentional Self Injurious Behavior: None Family Suicide History: No Recent stressful life event(s): Other (Comment) (pt believes she is being stalked and bullied ) Persecutory voices/beliefs?: No Depression: No Depression Symptoms: Insomnia Substance abuse history and/or treatment for substance abuse?: No Suicide prevention information given to non-admitted patients: Not applicable  Risk to Others within the past 6 months Homicidal Ideation: No Does patient have any lifetime risk of violence toward others beyond the six months prior to admission? : No Thoughts of Harm to Others: No Current Homicidal Intent: No Current Homicidal Plan: No Access to Homicidal Means: No History of harm to others?: No Assessment of Violence: None Noted Does patient have access to weapons?: No Criminal Charges Pending?: No Does patient have a court date: No Is patient on probation?: No  Psychosis Hallucinations: None noted Delusions: Unspecified  Mental Status Report Appearance/Hygiene: Disheveled, In scrubs Eye Contact: Good Motor Activity: Freedom of movement Speech: Tangential Level of Consciousness: Alert Mood: Anxious, Fearful Affect: Anxious, Frightened, Fearful Anxiety Level: Severe Thought Processes: Flight of Ideas, Tangential Judgement: Impaired Orientation: Person, Place, Time Obsessive Compulsive Thoughts/Behaviors: None  Cognitive Functioning Concentration: Normal Memory: Remote Intact, Recent Intact IQ: Average Insight: Poor Impulse Control: Poor Appetite: Good Sleep: Decreased Total Hours of Sleep: 6 Vegetative Symptoms: None  ADLScreening [facility] Assessment Services) Patient's cognitive ability adequate to  safely complete daily activities?: Yes Patient able to express need for assistance with ADLs?: Yes Independently performs ADLs?: Yes (appropriate for developmental age)  Prior Inpatient Therapy Prior Inpatient Therapy: Yes Prior Therapy Dates: 2013, 2012 Prior Therapy Facilty/Provider(s): [facility], [place] Reason for Treatment: Delusional D/O; MDD  Prior Outpatient Therapy Prior Outpatient Therapy: Yes Prior Therapy Dates: current Prior Therapy Facilty/Provider(s): [facility] Reason for Treatment: med management, therapy Does patient have an ACCT team?: No Does patient have Intensive In-House Services?  : No Does patient have [facility] services? : No Does patient have P4CC services?: No  ADL Screening (condition at time of admission) Patient's cognitive ability adequate to safely complete daily activities?: Yes Is the patient deaf or have difficulty hearing?: No Does the patient have difficulty seeing, even when wearing glasses/contacts?: No Does the patient have difficulty concentrating, remembering, or making decisions?: No Patient able to express need for assistance with ADLs?: Yes Does the patient have difficulty dressing or bathing?: No Independently performs ADLs?: Yes (appropriate for developmental age) Does the patient have difficulty walking or climbing stairs?: No Weakness of Legs: None Weakness of Arms/Hands: None  Home Assistive Devices/Equipment Home Assistive Devices/Equipment: None    Abuse/Neglect Assessment (Assessment to be complete while patient is alone) Physical Abuse: Denies Verbal Abuse: Yes, past (Comment), Yes, present (Comment) (according to pt she believes that she  is being bullied and stalked) Sexual Abuse: Denies Exploitation of patient/patient's resources: Denies Self-Neglect: Denies     Regulatory affairs officer (For Healthcare) Does Patient Have a Medical Advance Directive?: No Would patient like information on creating a medical  advance directive?: No - Patient declined    Additional Information 1:1 In Past 12 Months?: Yes (per chart) CIRT Risk: Yes (per chart) Elopement Risk: Yes (per chart) Does patient have medical clearance?: Yes     Disposition:  Disposition Initial Assessment Completed for this Encounter: Yes Disposition of Patient: Other dispositions Other disposition(s): Other (Comment) (Pt is to be monitored overnight and re-eval in AM for safety)  On Site Evaluation by:   Reviewed with Physician:    Lyanne Co 07/26/2017 3:08 AM

## 2017-07-26 NOTE — [facility] Suicide Risk Assessment (Signed)
Suicide Risk Assessment  Discharge Assessment   [facility] Discharge Suicide Risk Assessment   Principal Problem: Paranoia [facility]) Discharge Diagnoses:  Patient Active Problem List   Diagnosis Date Noted  . Anxiety [F41.9] 03/29/2016  . MDD (major depressive disorder), recurrent severe, without psychosis ([place]) [F33.2] 11/18/2015  . Homelessness [Z59.0] 11/18/2015  . Tobacco use disorder [F17.200] 07/05/2015  . Malnutrition of moderate degree ([place]) [E44.0] 05/31/2015  . Hypokalemia [E87.6] 05/30/2015  . Obstipation [K59.00] 05/30/2015  . Prolonged QT interval [R94.31] 05/30/2015  . Hypertension [I10] 05/30/2015  . Adjustment disorder with mixed disturbance of emotions and conduct [F43.25] 04/15/2015  . Suicidal ideations [R45.851] 03/12/2015  . Diabetes ([place]) [E11.9] 09/06/2012  . Paranoia ([place]) [F22] 04/16/2012    Total Time spent with patient: 45 minutes  Musculoskeletal: Strength & Muscle Tone: within normal limits Gait & Station: normal Patient leans: N/A  Psychiatric Specialty Exam:   Last menstrual period 10/29/2011.There is no height or weight on file to calculate BMI.  General Appearance: Casual  Eye Contact::  Good  Speech:  Clear and Coherent and Normal Rate409  Volume:  Normal  Mood:  Depressed  Affect:  Congruent and Depressed  Thought Process:  Coherent  Orientation:  Full (Time, Place, and Person)  Thought Content:  Delusions and Ideas of Reference:   Paranoia Delusions  Suicidal Thoughts:  No  Homicidal Thoughts:  No  Memory:  Immediate;   Good Recent;   Good Remote;   Fair  Judgement:  Poor  Insight:  Lacking  Psychomotor Activity:  Normal  Concentration:  Good  Recall:  Good  Fund of Knowledge:Fair  Language: Good  Akathisia:  No  Handed:  Right  AIMS (if indicated):     Assets:  Agricultural consultant Housing Resilience Social Support  Sleep:     Cognition: WNL  ADL's:  Intact   Mental Status Per Nursing  Assessment::   On Admission:   fixed delusion of being harassed  Demographic Factors:  Low socioeconomic status and Living alone  Loss Factors: Financial problems/change in socioeconomic status  Historical Factors: Impulsivity  Risk Reduction Factors:   Sense of responsibility to family  Continued Clinical Symptoms:  Severe Anxiety and/or Agitation Depression:   Impulsivity More than one psychiatric diagnosis  Cognitive Features That Contribute To Risk:  Closed-mindedness    Suicide Risk:  Minimal: No identifiable suicidal ideation.  Patients presenting with no risk factors but with morbid ruminations; may be classified as minimal risk based on the severity of the depressive symptoms    Plan Of Care/Follow-up recommendations:  Activity:  as tolerated Diet:  Heart Healthy  Ethelene Hal, NP 07/26/2017, 11:51 AM

## 2017-07-27 DIAGNOSIS — E119 Type 2 diabetes mellitus without complications: Secondary | ICD-10-CM | POA: Insufficient documentation

## 2017-07-27 DIAGNOSIS — R0981 Nasal congestion: Secondary | ICD-10-CM | POA: Insufficient documentation

## 2017-07-27 DIAGNOSIS — I10 Essential (primary) hypertension: Secondary | ICD-10-CM | POA: Insufficient documentation

## 2017-07-27 DIAGNOSIS — Z79899 Other long term (current) drug therapy: Secondary | ICD-10-CM | POA: Insufficient documentation

## 2017-07-27 DIAGNOSIS — M791 Myalgia, unspecified site: Secondary | ICD-10-CM | POA: Insufficient documentation

## 2017-07-27 NOTE — ED Triage Notes (Signed)
Pt states she has head congestion that started last night with some generalized body aches; pt states she needs to talk to someone mentally b/c she has file harrassment charges against someone in 2006 and he married someone from her church in 2007; pt states she is in a very deep difficult situation;when asked if SI/HI pt states she has a payout coming soon and that;s the only thing keeping her; pt a&ox 4 on arrival. Pt c/o pain at 6/10 on arrival.

## 2017-07-28 ENCOUNTER — Encounter (HOSPITAL_COMMUNITY): Payer: Self-pay | Admitting: Emergency Medicine

## 2017-07-28 ENCOUNTER — Emergency Department (HOSPITAL_COMMUNITY)
Admission: EM | Admit: 2017-07-28 | Discharge: 2017-07-28 | Disposition: A | Discharge status: Home / Self Care | Payer: Self-pay | Attending: Emergency Medicine | Admitting: Emergency Medicine

## 2017-07-28 ENCOUNTER — Emergency Department (HOSPITAL_COMMUNITY): Payer: Self-pay

## 2017-07-28 DIAGNOSIS — R52 Pain, unspecified: Secondary | ICD-10-CM

## 2017-07-28 DIAGNOSIS — Z5321 Procedure and treatment not carried out due to patient leaving prior to being seen by health care provider: Secondary | ICD-10-CM | POA: Insufficient documentation

## 2017-07-28 LAB — COMPREHENSIVE METABOLIC PANEL
ALBUMIN: 4 g/dL (ref 3.5–5.0)
ALK PHOS: 129 U/L — AB (ref 38–126)
ALT: 20 U/L (ref 14–54)
ANION GAP: 4 — AB (ref 5–15)
AST: 21 U/L (ref 15–41)
BUN: 21 mg/dL — AB (ref 6–20)
CALCIUM: 8.9 mg/dL (ref 8.9–10.3)
CO2: 24 mmol/L (ref 22–32)
Chloride: 105 mmol/L (ref 101–111)
Creatinine, Ser: 1.03 mg/dL — ABNORMAL HIGH (ref 0.44–1.00)
GFR calc Af Amer: >60 mL/min (ref >60)
GFR calc non Af Amer: >60 mL/min (ref >60)
GLUCOSE: 107 mg/dL — AB (ref 65–99)
POTASSIUM: 4 mmol/L (ref 3.5–5.1)
SODIUM: 133 mmol/L — AB (ref 135–145)
TOTAL PROTEIN: 6.9 g/dL (ref 6.5–8.1)
Total Bilirubin: 0.5 mg/dL (ref 0.3–1.2)

## 2017-07-28 LAB — CBC
HCT: 36.6 % (ref 36.0–46.0)
HEMOGLOBIN: 11.7 g/dL — AB (ref 12.0–15.0)
MCH: 25.6 pg — ABNORMAL LOW (ref 26.0–34.0)
MCHC: 32 g/dL (ref 30.0–36.0)
MCV: 80.1 fL (ref 78.0–100.0)
Platelets: 302 10*3/uL (ref 150–400)
RBC: 4.57 MIL/uL (ref 3.87–5.11)
RDW: 13.9 % (ref 11.5–15.5)
WBC: 6.3 10*3/uL (ref 4.0–10.5)

## 2017-07-28 LAB — HCG, QUANTITATIVE, PREGNANCY: HCG, BETA CHAIN, QUANT, S: 1 m[IU]/mL (ref <5)

## 2017-07-28 LAB — LIPASE, BLOOD: Lipase: 30 U/L (ref 11–51)

## 2017-07-28 MED ORDER — FLUTICASONE PROPIONATE 50 MCG/ACT NA SUSP: 1.0000 | Freq: Every day | NASAL | 0 refills | Status: DC

## 2017-07-28 NOTE — ED Notes (Signed)
Patient taken to XRAY

## 2017-07-28 NOTE — ED Triage Notes (Signed)
Pt reports she wants to kidneys checked, her potassium checked, she was just here this AM and they drew no blood work. Has no complaints, just states she wants to make sure her blood work is fine.   Pt has of of paranoia and psychosis.Marland KitchenMarland Kitchen

## 2017-07-28 NOTE — ED Notes (Signed)
Patient Alert and oriented X4. Stable and ambulatory. Patient verbalized understanding of the discharge instructions.  Patient belongings were taken by the patient.  

## 2017-07-28 NOTE — ED Notes (Signed)
Pt unable to provide UA b/c she had BM at same time and stool went into the hat with the urine

## 2017-07-28 NOTE — ED Provider Notes (Signed)
[facility] DEPT Provider Note   CSN: [id number] Arrival date & time: 07/27/17  2120     History   Chief Complaint Chief Complaint  Patient presents with  . Nasal Congestion  . Generalized Body Aches  . Psychiatric Evaluation    HPI [name] is a 51 y.o. female.  The history is provided by the patient and medical records. No language interpreter was used.   [name] is a 51 y.o. female  with a PMH of DM, HTN, anxiety, depression, homelessness and paronia who presents to the Emergency Department complaining of congestion and sinus pressure which began last night. Associated with generalized body aches. No medications taken prior to arrival for symptoms. No fever, chills, chest pain, shortness of breath, abdominal pain, n/v or diarrhea.   Patient additionally states that she needs to file a harassment charge against certain people who have been bothering her as well as members of the church. She informs me that people have been watching her, but the police are not doing anything about it. She states these people have not harmed her but this has been a problem since 2006. Seen by [facility] multiple times this month for same. The patient denies suicidal and homicidal ideations.  Past Medical History:  Diagnosis Date  . Anxiety   . Diabetes mellitus without complication ([place])   . Hypertension   . Major depressive disorder, recurrent, severe with psychotic features [facility])     Patient Active Problem List   Diagnosis Date Noted  . Anxiety 03/29/2016  . MDD (major depressive disorder), recurrent severe, without psychosis ([place]) 11/18/2015  . Homelessness 11/18/2015  . Tobacco use disorder 07/05/2015  . Malnutrition of moderate degree ([place]) 05/31/2015  . Hypokalemia 05/30/2015  . Obstipation 05/30/2015  . Prolonged QT interval 05/30/2015  . Hypertension 05/30/2015  . 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 Suicidal ideations 03/12/2015   . Diabetes ([place]) 09/06/2012  . Paranoia ([place]) 04/16/2012    No past surgical history on file.  OB History    No data available       Home Medications    Prior to Admission medications   Medication Sig Start Date End Date Taking? Authorizing Provider  asenapine (SAPHRIS) 5 MG SUBL 24 hr tablet Place 1 tablet (5 mg total) under the tongue 2 (two) times daily. 07/23/17   Patrecia Pour, NP  fluticasone (FLONASE) 50 MCG/ACT nasal spray Place 1 spray into both nostrils daily. 07/28/17   Jacquan Savas, Ozella Almond, PA-C    Family History Family History  Problem Relation Age of Onset  . Hypertension Mother   . Diabetes Mother   . Aneurysm Mother   . Hypertension Father   . Diabetes Father   . Hypertension Sister   . Diabetes Sister   . Hypertension Other   . Diabetes Other     Social History Social History  Substance Use Topics  . Smoking status: Never Smoker  . Smokeless tobacco: Never Used  . Alcohol use No     Allergies   Patient has no known allergies.   Review of Systems Review of Systems  HENT: Positive for congestion and sinus pressure.   Musculoskeletal: Positive for myalgias.  All other systems reviewed and are negative.    Physical Exam Updated Vital Signs BP 104/69   Pulse 69   Temp 98.2 F (36.8 C)   Resp 16   Ht 5' 5.5" (1.664 m)   LMP 10/29/2011 Comment: NEG U PREG  02/24/17  SpO2 95%   Physical Exam  Constitutional: She is oriented to person, place, and time. She appears well-developed and well-nourished. No distress.  HENT:  Head: Normocephalic and atraumatic.  Oropharynx with no erythema, exudates or tonsillar hypertrophy. + Mucosal edema. No focal areas of sinus tenderness.  Neck:  No meningeal signs.  Cardiovascular: Normal rate, regular rhythm and normal heart sounds.   No murmur heard. Pulmonary/Chest: Effort normal and breath sounds normal. No respiratory distress.  Abdominal: Soft. She exhibits no distension. There is no tenderness.    Musculoskeletal: She exhibits no edema.  Neurological: She is alert and oriented to person, place, and time.  Skin: Skin is warm and dry.  Nursing note and vitals reviewed.    ED Treatments / Results  Labs (all labs ordered are listed, but only abnormal results are displayed) Labs Reviewed - No data to display  EKG  EKG Interpretation None       Radiology Dg Chest 2 View  Result Date: 07/28/2017 CLINICAL DATA:  General body aches for 1 day. History of hypertension and diabetes. Nonsmoker. EXAM: CHEST  2 VIEW COMPARISON:  11/13/2015 FINDINGS: Shallow inspiration with linear atelectasis in the lung bases. No airspace disease or consolidation. No blunting of costophrenic angles. No pneumothorax. Heart size and pulmonary vascularity are normal. IMPRESSION: Shallow inspiration with atelectasis in the lung bases. No evidence of active pulmonary disease. Electronically Signed   By: Lucienne Capers M.D.   On: 07/28/2017 03:45    Procedures Procedures (including critical care time)  Medications Ordered in ED Medications - No data to display   Initial Impression / Assessment and Plan / ED Course  I have reviewed the triage vital signs and the nursing notes.  Pertinent labs & imaging results that were available during my care of the patient were reviewed by me and considered in my medical decision making (see chart for details).    [name] is a 51 y.o. female who presents to ED for generalized body aches, congestion, sinus pressure which began yesterday. Afebrile, hemodynamically stable with OP normal. Lungs CTA. No focal sinus tenderness, but bilateral nares with mucosal edema. CXR negative. Flonase and PCP follow up. Ibuprofen as needed for body aches.   Patient secondly informed me that she has been harassed since 2006 and is in a difficult situation. I personally evaluated this patient on 9/29 where she was stating the same. She was seen by psychiatry who recommended  overnight observation and then she was subsequently discharged. Per chart review, patient seen on 9/17, 9/18, 9/26 all for the same of which recommending outpatient follow-up. Patient denies suicidal or homicidal thoughts. She does not appear to be a threat to herself or others. I discussed situation with attending, Dr. Kathrynn Humble, who agrees that patient can be discharged from the emergency department tonight.  PCP follow-up strongly encouraged. Reasons to return to discussed. All questions answered.  Patient discussed with Dr. Kathrynn Humble who agrees with treatment plan.    Final Clinical Impressions(s) / ED Diagnoses   Final diagnoses:  Generalized body aches    New Prescriptions Discharge Medication List as of 07/28/2017  5:28 AM    START taking these medications   Details  fluticasone (FLONASE) 50 MCG/ACT nasal spray Place 1 spray into both nostrils daily., Starting Tue 07/28/2017, Print         Anthonio Mizzell, Ozella Almond, PA-C 07/28/17 1610    Varney Biles, MD 07/28/17 9604

## 2017-07-28 NOTE — Discharge Instructions (Signed)
It was my pleasure taking care of you today!   Flonase as needed for congestion and sinus pressure.   Follow up with your primary care provider for further discussion of today's visit.   Return to ER for new or worsening symptoms, any additional concerns.

## 2017-07-29 ENCOUNTER — Emergency Department (HOSPITAL_COMMUNITY)
Admission: EM | Admit: 2017-07-29 | Discharge: 2017-07-29 | Disposition: A | Discharge status: Home / Self Care | Payer: Self-pay | Attending: Emergency Medicine | Admitting: Emergency Medicine

## 2017-07-29 NOTE — ED Notes (Signed)
Pt get in a car at emergency exit and rode away.

## 2017-08-14 ENCOUNTER — Emergency Department (HOSPITAL_COMMUNITY): Payer: Self-pay

## 2017-08-14 ENCOUNTER — Emergency Department (HOSPITAL_COMMUNITY)
Admission: EM | Admit: 2017-08-14 | Discharge: 2017-08-14 | Disposition: A | Discharge status: Home / Self Care | Payer: Self-pay | Attending: Emergency Medicine | Admitting: Emergency Medicine

## 2017-08-14 ENCOUNTER — Encounter (HOSPITAL_COMMUNITY): Payer: Self-pay | Admitting: Emergency Medicine

## 2017-08-14 DIAGNOSIS — Z590 Homelessness: Secondary | ICD-10-CM | POA: Insufficient documentation

## 2017-08-14 DIAGNOSIS — E119 Type 2 diabetes mellitus without complications: Secondary | ICD-10-CM | POA: Insufficient documentation

## 2017-08-14 DIAGNOSIS — F419 Anxiety disorder, unspecified: Secondary | ICD-10-CM | POA: Insufficient documentation

## 2017-08-14 DIAGNOSIS — I10 Essential (primary) hypertension: Secondary | ICD-10-CM | POA: Insufficient documentation

## 2017-08-14 DIAGNOSIS — R0789 Other chest pain: Secondary | ICD-10-CM | POA: Insufficient documentation

## 2017-08-14 LAB — CBC
HEMATOCRIT: 37.6 % (ref 36.0–46.0)
Hemoglobin: 12.1 g/dL (ref 12.0–15.0)
MCH: 26 pg (ref 26.0–34.0)
MCHC: 32.2 g/dL (ref 30.0–36.0)
MCV: 80.9 fL (ref 78.0–100.0)
Platelets: 128 10*3/uL — ABNORMAL LOW (ref 150–400)
RBC: 4.65 MIL/uL (ref 3.87–5.11)
RDW: 14.2 % (ref 11.5–15.5)
WBC: 4.8 10*3/uL (ref 4.0–10.5)

## 2017-08-14 LAB — BASIC METABOLIC PANEL
ANION GAP: 11 (ref 5–15)
BUN: 20 mg/dL (ref 6–20)
CHLORIDE: 109 mmol/L (ref 101–111)
CO2: 23 mmol/L (ref 22–32)
Calcium: 9.4 mg/dL (ref 8.9–10.3)
Creatinine, Ser: 0.82 mg/dL (ref 0.44–1.00)
Glucose, Bld: 103 mg/dL — ABNORMAL HIGH (ref 65–99)
POTASSIUM: 3.9 mmol/L (ref 3.5–5.1)
SODIUM: 143 mmol/L (ref 135–145)

## 2017-08-14 LAB — I-STAT TROPONIN, ED: Troponin i, poc: 0 ng/mL (ref 0.00–0.08)

## 2017-08-14 NOTE — Discharge Instructions (Signed)
Please call and find a provider for further management of your health.  Use resources below.

## 2017-08-14 NOTE — ED Notes (Signed)
Asked Gertie Fey if he wanted a repeat EKG on patient since one was done earlier. Was told that he wasn't able to see it. This RN reprinted EKG and gave to [facility].
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
## 2017-08-14 NOTE — ED Notes (Signed)
ED Provider at bedside. 
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
## 2017-08-14 NOTE — ED Triage Notes (Signed)
Pt brought in by EMS with c/o panic attack  Pt has hx of paranoid delusions  Requested to come to the hospital  EKG performed  Normal results

## 2017-08-14 NOTE — ED Notes (Signed)
Bed: TC76 Expected date:  Expected time:  Means of arrival:  Comments: EMS-hypotension

## 2017-08-14 NOTE — ED Notes (Addendum)
IV unsuccessful but was able to draw blood off site prior to removing.

## 2017-08-14 NOTE — ED Notes (Signed)
This RN attempted IV access unsuccessfully. Ultrasound IV will be used to start an IV and draw labs.

## 2017-08-27 ENCOUNTER — Emergency Department (HOSPITAL_COMMUNITY)
Admission: EM | Admit: 2017-08-27 | Discharge: 2017-08-27 | Discharge status: Against Medical Advice | Payer: Self-pay | Attending: Emergency Medicine | Admitting: Emergency Medicine

## 2017-08-27 NOTE — ED Notes (Signed)
Pt brought in by [facility].  Pt walked out of room while [facility] still in department stating "I need peace and quiet.  I don't need all this noise."  Pt was eluding to another pt in dept yelling.

## 2017-08-27 NOTE — ED Notes (Signed)
Bed: VX42 Expected date:  Expected time:  Means of arrival:  Comments: EMS 51 yo female delusional

## 2017-08-31 ENCOUNTER — Encounter (HOSPITAL_COMMUNITY): Payer: Self-pay | Admitting: *Deleted

## 2017-08-31 ENCOUNTER — Other Ambulatory Visit: Payer: Self-pay

## 2017-08-31 ENCOUNTER — Emergency Department (HOSPITAL_COMMUNITY)
Admission: EM | Admit: 2017-08-31 | Discharge: 2017-08-31 | Disposition: A | Discharge status: Home / Self Care | Payer: Self-pay | Attending: Emergency Medicine | Admitting: Emergency Medicine

## 2017-08-31 DIAGNOSIS — Z5321 Procedure and treatment not carried out due to patient leaving prior to being seen by health care provider: Secondary | ICD-10-CM | POA: Insufficient documentation

## 2017-08-31 NOTE — ED Notes (Signed)
Pt just now stated while I was typing pervious comment that she now wants to stay.

## 2017-08-31 NOTE — ED Notes (Signed)
Pt is ambulatory. Pt stated "i just need to get checked out"

## 2017-08-31 NOTE — ED Notes (Signed)
Pt stated that she wanted to leave and go home. I told the pt the provider will be in her shortly. Pt stated that she just want to go home and come back if it gets worse.

## 2017-08-31 NOTE — ED Notes (Signed)
Pt to nurses station stating she is leaving. Encouraged pt to stay. Pt ambulatory, no acute distress.

## 2017-08-31 NOTE — ED Triage Notes (Signed)
The pt is here because she reports that she cannot breath very well  No audible wheezes completes paragraphs without becoming sob.  She was seen at [facility] long Friday for the same  She reports that she was not satisfied with the results there  No pain anywhere  lmp none

## 2017-11-24 ENCOUNTER — Emergency Department (HOSPITAL_COMMUNITY)
Admission: EM | Admit: 2017-11-24 | Discharge: 2017-11-24 | Disposition: A | Discharge status: Home / Self Care | Payer: Self-pay | Attending: Emergency Medicine | Admitting: Emergency Medicine

## 2017-11-24 DIAGNOSIS — R42 Dizziness and giddiness: Secondary | ICD-10-CM | POA: Insufficient documentation

## 2017-11-24 DIAGNOSIS — Z5321 Procedure and treatment not carried out due to patient leaving prior to being seen by health care provider: Secondary | ICD-10-CM | POA: Insufficient documentation
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
## 2017-12-06 ENCOUNTER — Emergency Department (HOSPITAL_COMMUNITY)
Admission: EM | Admit: 2017-12-06 | Discharge: 2017-12-06 | Disposition: A | Discharge status: Home / Self Care | Payer: Self-pay | Attending: Emergency Medicine | Admitting: Emergency Medicine

## 2017-12-06 DIAGNOSIS — E119 Type 2 diabetes mellitus without complications: Secondary | ICD-10-CM | POA: Insufficient documentation

## 2017-12-06 DIAGNOSIS — R42 Dizziness and giddiness: Secondary | ICD-10-CM | POA: Insufficient documentation

## 2017-12-06 DIAGNOSIS — F419 Anxiety disorder, unspecified: Secondary | ICD-10-CM | POA: Insufficient documentation

## 2017-12-06 DIAGNOSIS — I10 Essential (primary) hypertension: Secondary | ICD-10-CM | POA: Insufficient documentation

## 2017-12-06 MED ORDER — MECLIZINE HCL 25 MG PO TABS
25.0000 mg | ORAL_TABLET | Freq: Once | ORAL | Status: AC
  Administered 2017-12-06: 25 mg via ORAL
  Filled 2017-12-06: qty 1

## 2017-12-06 MED ORDER — MECLIZINE HCL 25 MG PO TABS: 25.0000 mg | ORAL_TABLET | Freq: Three times a day (TID) | ORAL | 0 refills | Status: DC | PRN

## 2017-12-06 NOTE — ED Triage Notes (Signed)
Transported by [facility] from home--pt called EMS because she believes that people have been "harassing her and out to get her." Denies any SI/HI, denies any pain.

## 2017-12-06 NOTE — ED Notes (Signed)
Bed: NB39 Expected date:  Expected time:  Means of arrival:  Comments: 52 yo hallucinations, paranoia
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
## 2017-12-06 NOTE — ED Notes (Signed)
After obtaining patient vital signs, patient stated "Oh, my vital signs look good, I was just worried, I don't really need to be seen here. I will just go back home." Dr. Venora Maples at bedside and charge nurse aware. Patient conscious, alert and able to walk with steady gait. Awaiting disposition at this time.

## 2017-12-31 ENCOUNTER — Inpatient Hospital Stay (INDEPENDENT_AMBULATORY_CARE_PROVIDER_SITE_OTHER): Payer: Self-pay | Admitting: Physician Assistant

## 2018-01-05 ENCOUNTER — Other Ambulatory Visit: Payer: Self-pay

## 2018-01-05 ENCOUNTER — Encounter (HOSPITAL_COMMUNITY): Payer: Self-pay

## 2018-01-05 DIAGNOSIS — Z590 Homelessness: Secondary | ICD-10-CM | POA: Insufficient documentation

## 2018-01-05 DIAGNOSIS — F419 Anxiety disorder, unspecified: Secondary | ICD-10-CM | POA: Insufficient documentation

## 2018-01-05 DIAGNOSIS — F4325 Adjustment disorder with mixed disturbance of emotions and conduct: Secondary | ICD-10-CM | POA: Insufficient documentation

## 2018-01-05 DIAGNOSIS — F329 Major depressive disorder, single episode, unspecified: Secondary | ICD-10-CM | POA: Insufficient documentation

## 2018-01-05 DIAGNOSIS — F600 Paranoid personality disorder: Secondary | ICD-10-CM | POA: Insufficient documentation

## 2018-01-05 DIAGNOSIS — E119 Type 2 diabetes mellitus without complications: Secondary | ICD-10-CM | POA: Insufficient documentation

## 2018-01-05 DIAGNOSIS — F062 Psychotic disorder with delusions due to known physiological condition: Secondary | ICD-10-CM | POA: Insufficient documentation

## 2018-01-05 DIAGNOSIS — I10 Essential (primary) hypertension: Secondary | ICD-10-CM | POA: Insufficient documentation

## 2018-01-05 DIAGNOSIS — Z8673 Personal history of transient ischemic attack (TIA), and cerebral infarction without residual deficits: Secondary | ICD-10-CM | POA: Insufficient documentation

## 2018-01-05 NOTE — ED Triage Notes (Signed)
Pt requesting "total body xrays and ct scans to look for cyberware".  Reports she was sent here by the [facility] and the [facility] on [employer]

## 2018-01-05 NOTE — ED Triage Notes (Signed)
Pt requesting 'total body XR" to determine if she is "on cyberware". Claims she was sent here by [employer] and [employer]. Pt w/ hx of similar complaints. Pt also endorses dizziness/lightheadedness x mos. States she has referral from [facility] for neuro f/u for same complaint.

## 2018-01-06 ENCOUNTER — Emergency Department (HOSPITAL_COMMUNITY)
Admission: EM | Admit: 2018-01-06 | Discharge: 2018-01-07 | Disposition: A | Discharge status: Other Acute Inpatient Hospital | Payer: Self-pay | Attending: Emergency Medicine | Admitting: Emergency Medicine

## 2018-01-06 DIAGNOSIS — F22 Delusional disorders: Secondary | ICD-10-CM

## 2018-01-06 LAB — COMPREHENSIVE METABOLIC PANEL WITH GFR
ALT: 25 U/L (ref 14–54)
AST: 24 U/L (ref 15–41)
Albumin: 4 g/dL (ref 3.5–5.0)
Alkaline Phosphatase: 137 U/L — ABNORMAL HIGH (ref 38–126)
Anion gap: 11 (ref 5–15)
BUN: 17 mg/dL (ref 6–20)
CO2: 22 mmol/L (ref 22–32)
Calcium: 9.3 mg/dL (ref 8.9–10.3)
Chloride: 107 mmol/L (ref 101–111)
Creatinine, Ser: 0.89 mg/dL (ref 0.44–1.00)
GFR calc Af Amer: >60 mL/min
GFR calc non Af Amer: >60 mL/min
Glucose, Bld: 112 mg/dL — ABNORMAL HIGH (ref 65–99)
Potassium: 3.9 mmol/L (ref 3.5–5.1)
Sodium: 140 mmol/L (ref 135–145)
Total Bilirubin: 0.3 mg/dL (ref 0.3–1.2)
Total Protein: 7.3 g/dL (ref 6.5–8.1)

## 2018-01-06 LAB — RAPID URINE DRUG SCREEN, HOSP PERFORMED
Amphetamines: NOT DETECTED
Barbiturates: NOT DETECTED
Benzodiazepines: NOT DETECTED
Cocaine: NOT DETECTED
Opiates: NOT DETECTED
Tetrahydrocannabinol: NOT DETECTED

## 2018-01-06 LAB — CBC WITH DIFFERENTIAL/PLATELET
BASOS ABS: 0 10*3/uL (ref 0.0–0.1)
BASOS PCT: 0 %
EOS ABS: 0.2 10*3/uL (ref 0.0–0.7)
Eosinophils Relative: 3 %
HEMATOCRIT: 36.9 % (ref 36.0–46.0)
HEMOGLOBIN: 11.5 g/dL — AB (ref 12.0–15.0)
Lymphocytes Relative: 37 %
Lymphs Abs: 2.6 10*3/uL (ref 0.7–4.0)
MCH: 25.4 pg — ABNORMAL LOW (ref 26.0–34.0)
MCHC: 31.2 g/dL (ref 30.0–36.0)
MCV: 81.6 fL (ref 78.0–100.0)
MONOS PCT: 4 %
Monocytes Absolute: 0.3 10*3/uL (ref 0.1–1.0)
NEUTROS ABS: 3.8 10*3/uL (ref 1.7–7.7)
NEUTROS PCT: 56 %
Platelets: 296 10*3/uL (ref 150–400)
RBC: 4.52 MIL/uL (ref 3.87–5.11)
RDW: 14.8 % (ref 11.5–15.5)
WBC: 6.9 10*3/uL (ref 4.0–10.5)

## 2018-01-06 LAB — ETHANOL: Alcohol, Ethyl (B): <10 mg/dL

## 2018-01-06 LAB — I-STAT BETA HCG BLOOD, ED (MC, WL, AP ONLY): I-stat hCG, quantitative: <5 m[IU]/mL (ref <5)

## 2018-01-06 LAB — D-DIMER, QUANTITATIVE (NOT AT ARMC)

## 2018-01-06 MED ORDER — ALUM & MAG HYDROXIDE-SIMETH 200-200-20 MG/5ML PO SUSP: 30.0000 mL | Freq: Four times a day (QID) | ORAL | Status: DC | PRN

## 2018-01-06 MED ORDER — ONDANSETRON HCL 4 MG PO TABS: 4.0000 mg | ORAL_TABLET | Freq: Three times a day (TID) | ORAL | Status: DC | PRN

## 2018-01-06 MED ORDER — ACETAMINOPHEN 325 MG PO TABS: 650.0000 mg | ORAL_TABLET | ORAL | Status: DC | PRN

## 2018-01-06 NOTE — ED Notes (Signed)
Belongings stored in locker #2, Pt has four bags. Valuables sent to security.
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
## 2018-01-06 NOTE — ED Notes (Signed)
Pt states she does not want to go be treated for inpatient treatment. Pt states "I just want to leave and get my life back on track, I'm tired of everyone else having power over my life." Pt remained calm and pleasant during this conversation.

## 2018-01-06 NOTE — ED Notes (Signed)
Pt refused for tech to take her vitals while she was eating dinner.

## 2018-01-06 NOTE — ED Notes (Signed)
Regular breakfast tray ordered.  
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
## 2018-01-06 NOTE — ED Notes (Signed)
Pt eating meal at this time

## 2018-01-06 NOTE — ED Notes (Signed)
TTS set up at bedside. 

## 2018-01-06 NOTE — ED Notes (Signed)
Meal delivered, pt refused.

## 2018-01-07 ENCOUNTER — Inpatient Hospital Stay (HOSPITAL_COMMUNITY)
Admission: AD | Admit: 2018-01-07 | Discharge: 2018-01-14 | DRG: 885 | Disposition: A | Discharge status: Home / Self Care | Payer: Federal, State, Local not specified - Other | Source: Intra-hospital | Attending: Psychiatry | Admitting: Psychiatry

## 2018-01-07 ENCOUNTER — Other Ambulatory Visit: Payer: Self-pay

## 2018-01-07 ENCOUNTER — Encounter (HOSPITAL_COMMUNITY): Payer: Self-pay

## 2018-01-07 DIAGNOSIS — F22 Delusional disorders: Principal | ICD-10-CM | POA: Diagnosis present

## 2018-01-07 DIAGNOSIS — I10 Essential (primary) hypertension: Secondary | ICD-10-CM | POA: Diagnosis present

## 2018-01-07 DIAGNOSIS — R450 Nervousness: Secondary | ICD-10-CM | POA: Diagnosis not present

## 2018-01-07 DIAGNOSIS — E119 Type 2 diabetes mellitus without complications: Secondary | ICD-10-CM | POA: Diagnosis present

## 2018-01-07 DIAGNOSIS — Z8249 Family history of ischemic heart disease and other diseases of the circulatory system: Secondary | ICD-10-CM | POA: Diagnosis not present

## 2018-01-07 DIAGNOSIS — Z87891 Personal history of nicotine dependence: Secondary | ICD-10-CM

## 2018-01-07 DIAGNOSIS — R4587 Impulsiveness: Secondary | ICD-10-CM | POA: Diagnosis not present

## 2018-01-07 DIAGNOSIS — F332 Major depressive disorder, recurrent severe without psychotic features: Secondary | ICD-10-CM | POA: Diagnosis present

## 2018-01-07 DIAGNOSIS — F419 Anxiety disorder, unspecified: Secondary | ICD-10-CM | POA: Diagnosis present

## 2018-01-07 DIAGNOSIS — F609 Personality disorder, unspecified: Secondary | ICD-10-CM | POA: Diagnosis present

## 2018-01-07 DIAGNOSIS — Z833 Family history of diabetes mellitus: Secondary | ICD-10-CM | POA: Diagnosis not present

## 2018-01-07 MED ORDER — ASENAPINE MALEATE 5 MG SL SUBL
5.0000 mg | SUBLINGUAL_TABLET | Freq: Two times a day (BID) | SUBLINGUAL | Status: DC
  Filled 2018-01-07 (×8): qty 1

## 2018-01-07 NOTE — ED Notes (Signed)
Pt lunch tray at bedside  

## 2018-01-07 NOTE — Progress Notes (Signed)
[name] is a 52 year old female being admitted involuntarily to [facility] from [facility].  She came to the ED for delusional behaviors believing that the police and FBI sent her to the ED for a full body x-ray.  She believes that she has cyber wear placed in her body somewhere.  During [facility] admission, she was very guarded.  She answered many questions with "after the law suit I'll tell you."  She denies any SI/HI or A/V hallucinations.  She stated that she doesn't know why she is here because she just came in for an x-ray.  "You need to talk to [facility] police about why they wanted me to have an x-ray."  Oriented her to the unit.  Paperwork reviewed but she refused to sign everything but the belongings sheet.  Belongings searched and secured in locker # 17, no contraband found.  Skin assessment completed and no skin issues noted.  Q 15 minute checks initiated for safety.  We will monitor the progress towards her goals.

## 2018-01-07 NOTE — ED Notes (Signed)
Pt requesting reassessment. BH made aware and state she will be reassessed soon. Pt made aware

## 2018-01-07 NOTE — Tx Team (Addendum)
Initial Treatment Plan 01/07/2018 10:25 PM [name] EZM:[id number]    PATIENT STRESSORS: Legal issue Other: delusional   PATIENT STRENGTHS: Capable of independent living Physical Health Work skills   PATIENT IDENTIFIED PROBLEMS: Delusional/psychosis  "Help me get my lawsuit against [employer]"  "Help me get legal resources"                 DISCHARGE CRITERIA:  Improved stabilization in mood, thinking, and/or behavior Verbal commitment to aftercare and medication compliance  PRELIMINARY DISCHARGE PLAN: Outpatient therapy Medication management  PATIENT/FAMILY INVOLVEMENT: This treatment plan has been presented to and reviewed with the patient, [name].  The patient and family have been given the opportunity to ask questions and make suggestions.  Windell Moment, RN 01/07/2018, 10:25 PM

## 2018-01-07 NOTE — Consult Note (Signed)
  Medication review.  Patient has a history of QT prolongation; will need to doe a EKG prior to restarting antipsychic.  If QTC fine can restart Saphris 24 hr 5 mg SUBL bid  [name] B. Robt Okuda, NP

## 2018-01-07 NOTE — ED Notes (Signed)
Regular Diet ordered for Dinner. 

## 2018-01-07 NOTE — Progress Notes (Signed)
Pt on unit and is suspicious of environment.  Pt asking why doors are locked. "Pt given salad for dinner and complains that she is sure that the chicken is old".  "Can I have ice cream", Can I have a new roommate, I dont like this one", "I need some cookies".  "Can I sleep somewhere else?" Pt is constantly at NS station asking for something.Pt redirected to her room and is asked to stay in bed to get some sleep.
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
## 2018-01-07 NOTE — Progress Notes (Signed)
QTc 435, will order home med Saphris 5 mg BID.

## 2018-01-07 NOTE — Progress Notes (Signed)
Pt to nurses station.  Pt angry.  Pt refuses meds.  Pt sts "I dont take that med.  I dont take any meds."  Pt advised to lower her voice and return to her room. Pt not given her meds. Pt back in room at this time

## 2018-01-08 DIAGNOSIS — R450 Nervousness: Secondary | ICD-10-CM

## 2018-01-08 DIAGNOSIS — F22 Delusional disorders: Principal | ICD-10-CM

## 2018-01-08 DIAGNOSIS — F419 Anxiety disorder, unspecified: Secondary | ICD-10-CM

## 2018-01-08 MED ORDER — LORAZEPAM 1 MG PO TABS
1.0000 mg | ORAL_TABLET | ORAL | Status: DC | PRN
  Filled 2018-01-08: qty 1

## 2018-01-08 MED ORDER — ZIPRASIDONE MESYLATE 20 MG IM SOLR
20.0000 mg | INTRAMUSCULAR | Status: DC | PRN
  Filled 2018-01-08: qty 20

## 2018-01-08 MED ORDER — OLANZAPINE 10 MG PO TBDP
10.0000 mg | ORAL_TABLET | Freq: Three times a day (TID) | ORAL | Status: DC | PRN
  Administered 2018-01-09 – 2018-01-10 (×2): 10 mg via ORAL
  Filled 2018-01-08 (×2): qty 1

## 2018-01-08 NOTE — BHH Group Notes (Signed)
LCSW Group Therapy Note  01/08/2018 1:15pm  Type of Therapy and Topic:  Group Therapy:  Feelings around Relapse and Recovery  Participation Level:  Active   Description of Group:    Patients in this group will discuss emotions they experience before and after a relapse. They will process how experiencing these feelings, or avoidance of experiencing them, relates to having a relapse. Facilitator will guide patients to explore emotions they have related to recovery. Patients will be encouraged to process which emotions are more powerful. They will be guided to discuss the emotional reaction significant others in their lives may have to their relapse or recovery. Patients will be assisted in exploring ways to respond to the emotions of others without this contributing to a relapse.  Therapeutic Goals: 1. Patient will identify two or more emotions that lead to a relapse for them 2. Patient will identify two emotions that result when they relapse 3. Patient will identify two emotions related to recovery 4. Patient will demonstrate ability to communicate their needs through discussion and/or role plays   Summary of Patient Progress:  [name] came to group but did not stay the entire time.  She left shortly after the beginning of the group. She returned later on and shared with the group that for her relapse is a system of checks and balances that help her to prevent relapsing.  She also takes caution with who she allows to be a part of her life.  She was preoccupied with religion and the use of curse words when discussing her thoughts about relapse.   Therapeutic Modalities:   Cognitive Behavioral Therapy Solution-Focused Therapy Assertiveness Training Relapse Prevention Therapy   Darleen Crocker, [place] [employer] 01/08/2018 1:30 PM
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
## 2018-01-08 NOTE — Tx Team (Signed)
Interdisciplinary Treatment and Diagnostic Plan Update  01/08/2018 Time of Session: 10:43 AM  [name] MRN: [id number]  Principal Diagnosis: Delusional disorder, with bizarre content, multiple episodes currently in acute episode ([place])  Secondary Diagnoses: Active Problems:   Severe manic bipolar 1 disorder with psychotic behavior (HCC)   Current Medications:  Current Facility-Administered Medications  Medication Dose Route Frequency Provider Last Rate Last Dose  . asenapine (SAPHRIS) sublingual tablet 5 mg  5 mg Sublingual BID Lindon Romp A, NP        PTA Medications: Medications Prior to Admission  Medication Sig Dispense Refill Last Dose  . asenapine (SAPHRIS) 5 MG SUBL 24 hr tablet Place 1 tablet (5 mg total) under the tongue 2 (two) times daily. (Patient not taking: Reported on 08/14/2017) 60 tablet 0 Not Taking at Unknown time  . fluticasone (FLONASE) 50 MCG/ACT nasal spray Place 1 spray into both nostrils daily. (Patient not taking: Reported on 08/14/2017) 16 g 0 Not Taking at Unknown time  . meclizine (ANTIVERT) 25 MG tablet Take 1 tablet (25 mg total) by mouth 3 (three) times daily as needed for dizziness. (Patient not taking: Reported on 01/06/2018) 30 tablet 0 Completed Course at Unknown time    Patient Stressors: Legal issue Other: delusional  Patient Strengths: Capable of independent living Physical Health Work skills  Treatment Modalities: Medication Management, Group therapy, Case management,  1 to 1 session with clinician, Psychoeducation, Recreational therapy.   Physician Treatment Plan for Primary Diagnosis: <principal problem not specified> Long Term Goal(s): Improvement in symptoms so as ready for discharge  Short Term Goals:    Medication Management: Evaluate patient's response, side effects, and tolerance of medication regimen.  Therapeutic Interventions: 1 to 1 sessions, Unit Group sessions and Medication administration.  Evaluation of Outcomes:  Progressing  Physician Treatment Plan for Secondary Diagnosis: Active Problems:   Severe manic bipolar 1 disorder with psychotic behavior ([place])   Long Term Goal(s): Improvement in symptoms so as ready for discharge  Short Term Goals:    Medication Management: Evaluate patient's response, side effects, and tolerance of medication regimen.  Therapeutic Interventions: 1 to 1 sessions, Unit Group sessions and Medication administration.  Evaluation of Outcomes: Progressing   RN Treatment Plan for Primary Diagnosis: Delusional disorder, with bizarre content, multiple episodes currently in acute episode ([place]) Long Term Goal(s): Knowledge of disease and therapeutic regimen to maintain health will improve  Short Term Goals: Ability to identify and develop effective coping behaviors will improve and Compliance with prescribed medications will improve  Medication Management: RN will administer medications as ordered by provider, will assess and evaluate patient's response and provide education to patient for prescribed medication. RN will report any adverse and/or side effects to prescribing provider.  Therapeutic Interventions: 1 on 1 counseling sessions, Psychoeducation, Medication administration, Evaluate responses to treatment, Monitor vital signs and CBGs as ordered, Perform/monitor CIWA, COWS, AIMS and Fall Risk screenings as ordered, Perform wound care treatments as ordered.  Evaluation of Outcomes: Progressing   LCSW Treatment Plan for Primary Diagnosis: Delusional disorder, with bizarre content, multiple episodes currently in acute episode ([place]) Long Term Goal(s): Safe transition to appropriate next level of care at discharge, Engage patient in therapeutic group addressing interpersonal concerns.  Short Term Goals: Engage patient in aftercare planning with referrals and resources  Therapeutic Interventions: Assess for all discharge needs, 1 to 1 time with Social worker, Explore  available resources and support systems, Assess for adequacy in community support network, Educate family and significant other(s) on  suicide prevention, Complete Psychosocial Assessment, Interpersonal group therapy.  Evaluation of Outcomes: Met  Pt will d/c to previous situation, likely avoid any follow up appointments   Progress in Treatment: Attending groups: Yes Participating in groups: Yes Taking medication as prescribed: Yes Toleration medication: Yes, no side effects reported at this time Family/Significant other contact made: No Patient understands diagnosis: No Limited insight Discussing patient identified problems/goals with staff: Yes Medical problems stabilized or resolved: Yes Denies suicidal/homicidal ideation: Yes Issues/concerns per patient self-inventory: None Other: N/A  New problem(s) identified: None identified at this time.   New Short Term/Long Term Goal(s): "I need to be checked for cyberware." Pt will not sign the treatment plan. Discharge Plan or Barriers:   Reason for Continuation of Hospitalization: Paranoia Delusions  Medication stabilization   Estimated Length of Stay: 01/13/18  Attendees: Patient: [name] 01/08/2018  10:43 AM  Physician: Maris Berger, MD 01/08/2018  10:43 AM  Nursing: Sena Hitch, RN 01/08/2018  10:43 AM  RN Care Manager: Lars Pinks, RN 01/08/2018  10:43 AM  Social Worker: Ripley Fraise 01/08/2018  10:43 AM  Recreational Therapist: Winfield Cunas 01/08/2018  10:43 AM  Other: Norberto Sorenson 01/08/2018  10:43 AM  Other:  01/08/2018  10:43 AM    Scribe for Treatment Team:  Roque Lias LCSW 01/08/2018 10:43 AM

## 2018-01-08 NOTE — Progress Notes (Signed)
Recreation Therapy Notes  INPATIENT RECREATION THERAPY ASSESSMENT  Patient Details Name: [name] MRN: [id number] DOB: [birth year] Today's Date: 01/08/2018       Information Obtained From: Patient  Able to Participate in Assessment/Interview: Yes  Patient Presentation: Responsive  Reason for Admission (Per Patient): Patient Unable to Identify Patient reports being admitted into the hospital Involuntary commit   Patient Stressors: Patient identified no stressors   Coping Skills:   TV, Arguments, Talk, Prayer  Leisure Interests (2+):  Individual - TV, Product manager of Recreation/Participation: Weekly  Awareness of Community Resources:  Yes  Community Resources:  Park, Tax inspector, Engineer, drilling  Current Use: No  If no, Barriers?: Museum/gallery curator  Expressed Interest in [place] of Residence:  Investment banker, corporate   Patient Main Form of Transportation: Diplomatic Services operational officer  Patient Strengths:  Communicating, singing   Patient Identified Areas of Improvement:  Patient was not able to identify any areas of improvements   Patient Goal for Hospitalization:  Patient was not able to identify a goal   Current SI (including self-harm):  No  Current HI:  No  Current AVH: No  Staff Intervention Plan: Group Attendance  Consent to Intern Participation: Yes   Ranell Patrick, Recreation Therapy Intern   Ranell Patrick 01/08/2018, 2:23 PM

## 2018-01-08 NOTE — Progress Notes (Addendum)
Nursing Progress Note: 7p-7a D: Pt currently presents with a intrusive/dominating/anxious/hyperactive/paranoid affect and behavior. Pt states "I don't take meds because they are poison to me. Lord knows they are just a bunch of little trackers put in my body so the government can watch my every move." Interacting intrusively with the milieu. Pt reports fair sleep during the previous night with current medication regimen. Pt did not attend wrap-up group.  A: Pt refused night time medications. Pt's labs and vitals were monitored throughout the night. Pt supported emotionally and encouraged to express concerns and questions. Pt educated on medications.  R: Pt's safety ensured with 15 minute and environmental checks. Pt currently denies SI, HI, and VH and endorses AH. Pt verbally contracts to seek staff if SI,HI, or AVH occurs and to consult with staff before acting on any harmful thoughts. Will continue to monitor.

## 2018-01-08 NOTE — Progress Notes (Signed)
[facility] Second Physician Opinion Progress Note for Medication Administration to Non-consenting Patients (For Involuntarily Committed Patients)  Patient: [name] Date of Birth: 18-Sep-1966   52 y.o AAF with background history of delusional disorder persecutory type. She is consumed with belief that a device has been implanted in her head. She has repeated presented to law enforcement agents. Feels same was implanted in her step father and father. She feels it is being used to study their genetic component. She does not feel her life is being threatened. She is very focused on getting a total body scan. She does not have any thoughts of suicide. She does not have any thoughts of going after anyone. She does not have any thoughts of violence.  Patient does not have insight into her illness. She does not feel she needs medication despite repeated explanation of the benefits of treatment. I believe she has an enduring mental illness. Unfortunately I do not feel comfortable forcing medication as there is no clear dangerousness.
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
## 2018-01-08 NOTE — BHH Suicide Risk Assessment (Signed)
[facility] Admission Suicide Risk Assessment   Nursing information obtained from:  Patient Demographic factors:  Living alone Current Mental Status:  NA Loss Factors:  NA Historical Factors:  NA Risk Reduction Factors:  NA  Total Time spent with patient: 1 hour Principal Problem: Delusional disorder, with bizarre content, multiple episodes currently in acute episode ([place]) Diagnosis:   Patient Active Problem List   Diagnosis Date Noted  . Delusional disorder, with bizarre content, multiple episodes currently in acute episode ([place]) [F22] 01/07/2018  . Anxiety [F41.9] 03/29/2016  . MDD (major depressive disorder), recurrent severe, without psychosis ([place]) [F33.2] 11/18/2015  . Homelessness [Z59.0] 11/18/2015  . Tobacco use disorder [F17.200] 07/05/2015  . Malnutrition of moderate degree ([place]) [E44.0] 05/31/2015  . Hypokalemia [E87.6] 05/30/2015  . Obstipation [K59.00] 05/30/2015  . Prolonged QT interval [R94.31] 05/30/2015  . Hypertension [I10] 05/30/2015  . Adjustment disorder with mixed disturbance of emotions and conduct [F43.25] 04/15/2015  . Suicidal ideations [R45.851] 03/12/2015  . Diabetes ([place]) [E11.9] 09/06/2012  . Paranoia ([place]) [F22] 04/16/2012   Subjective Data: See H&P for details  Continued Clinical Symptoms:  Alcohol Use Disorder Identification Test Final Score (AUDIT): 0 The "Alcohol Use Disorders Identification Test", Guidelines for Use in Primary Care, Second Edition.  World Pharmacologist [facility]). Score between 0-7:  no or low risk or alcohol related problems. Score between 8-15:  moderate risk of alcohol related problems. Score between 16-19:  high risk of alcohol related problems. Score 20 or above:  warrants further diagnostic evaluation for alcohol dependence and treatment.   CLINICAL FACTORS:   Severe Anxiety and/or Agitation More than one psychiatric diagnosis Currently Psychotic Unstable or Poor Therapeutic Relationship Previous Psychiatric Diagnoses  and Treatments Medical Diagnoses and Treatments/Surgeries  Psychiatric Specialty Exam: Physical Exam  Nursing note and vitals reviewed.   ROS - see H&P for details  Blood pressure 107/89, pulse (!) 135, temperature 98.5 F (36.9 C), temperature source Oral, resp. rate 18, height 5' 5.5" (1.664 m), weight 114.8 kg (253 lb), last menstrual period 10/29/2011, SpO2 100 %.Body mass index is 41.46 kg/m.     COGNITIVE FEATURES THAT CONTRIBUTE TO RISK:  Closed-mindedness, Polarized thinking and Thought constriction (tunnel vision)    SUICIDE RISK:   Minimal: No identifiable suicidal ideation.  Patients presenting with no risk factors but with morbid ruminations; may be classified as minimal risk based on the severity of the depressive symptoms  PLAN OF CARE: See H&P for details  I certify that inpatient services furnished can reasonably be expected to improve the patient's condition.   Pennelope Bracken, MD 01/08/2018, 2:11 PM

## 2018-01-08 NOTE — Progress Notes (Signed)
Adult Psychoeducational Group Note  Date:  01/08/2018 Time:  9:50 AM  Group Topic/Focus:  Goals Group:   The focus of this group is to help patients establish daily goals to achieve during treatment and discuss how the patient can incorporate goal setting into their daily lives to aide in recovery.  Participation Level:  Minimal  Participation Quality:  Redirectable  Affect:  Not Congruent  Cognitive:  Disorganized  Insight: Lacking  Engagement in Group:  Lacking  Modes of Intervention:  Discussion  Additional Comments:  Pt was very distracted by her thoughts.  Pt talked to herself and didnt engage in the group discussion.  Pt stood up at times having conversations with a person that was not in the room.  Pt continued her discussion well after group was over.  Paris Lore Aalaya Yadao 01/08/2018, 9:50 AM

## 2018-01-08 NOTE — BH Specialist Note (Signed)
Pt observed in room standing and looking toward window speaking aloud to self "it's gotta be Rudean Curt doing this. It must be because of the church". Active psychosis. Continuing to monitor on rounds.

## 2018-01-08 NOTE — Progress Notes (Signed)
DAR NOTE: Patient presents with irritable affect with mood lability. Patient is loud and hyper religious at times.  Exhibiting paranoia with flight of ideas. Observed talking to her self in the hallway and dayroom.  Threatened to pour hot cup of coffee on a peer earlier during the day.  Denies suicidal thoughts, auditory and visual hallucinations.  Rates depression at 0, hopelessness at 0, and anxiety at 0.  Maintained on routine safety checks.  Refused prescribed medication after several attempts and encouragements.  Patient states she is not going to take any psychotropic medications.  Support and encouragement offered as needed.  States goal for today is "getting out of here."  Patient is safe on the unit.

## 2018-01-08 NOTE — Progress Notes (Addendum)
Recreation Therapy Notes  Date: 3.15.19 Time: 10:00 a.m. Location: 500 Hall Dayroom  Group Topic: Stress Management  Goal Area(s) Addresses:  Goal 1.1: To reduce stress  -Patient will report feeling a reduction in stress level  -Patient will identify the importance of stress management  -Patient will participate during stress management group treatment    Intervention: Stress Management  Activity: Yoga- Recreation Therapy Intern instructed a yoga group. Patients were in a peaceful environment with soft lighting enhancing patients mood.   Education: Stress Management, Discharge Planning.   Education Outcome: Acknowledges edcuation/In group clarification offered/Needs additional education  Clinical Observations/Feedback:: Patient did not attend    Ranell Patrick, Recreation Therapy Intern   Ranell Patrick 01/08/2018 10:54 AM

## 2018-01-09 DIAGNOSIS — R4587 Impulsiveness: Secondary | ICD-10-CM

## 2018-01-09 DIAGNOSIS — R451 Restlessness and agitation: Secondary | ICD-10-CM

## 2018-01-09 MED ORDER — ASENAPINE MALEATE 5 MG SL SUBL
10.0000 mg | SUBLINGUAL_TABLET | Freq: Two times a day (BID) | SUBLINGUAL | Status: DC
  Filled 2018-01-09 (×4): qty 2

## 2018-01-09 NOTE — Progress Notes (Signed)
Dar Note: Patient presents with irritable affect and agitated mood.  Patient observed talking to herself very loud and hyper religious.  Observed responding to internal stimuli.  Received Zyprexa 10 mg given for severe agitation with good effect.  Support and encouragement offered as needed.  Patient is safe on the unit.

## 2018-01-09 NOTE — BHH Group Notes (Signed)
[facility] Group Notes: (Clinical Social Work)   01/09/2018      Type of Therapy:  Group Therapy   Participation Level:  Did Not Attend despite MHT prompting   Selmer Dominion, LCSW 01/09/2018, 3:51 PM

## 2018-01-09 NOTE — Progress Notes (Signed)
Pt did not attend group. 

## 2018-01-09 NOTE — Progress Notes (Signed)
[facility] MD Progress Note  01/09/2018 4:17 PM [name]  MRN:  [id number] Subjective:  Patient was aggressive earlier and threatening to throw coffee on other patients as she got in their face.  Patient's removed to their rooms, IM agitation medications needed with good results, asleep since this time.  Close monitoring continues. Principal Problem: Delusional disorder, with bizarre content, multiple episodes currently in acute episode [facility]) Diagnosis:   Patient Active Problem List   Diagnosis Date Noted  . Delusional disorder, with bizarre content, multiple episodes currently in acute episode ([place]) [F22] 01/07/2018    Priority: High  . Anxiety [F41.9] 03/29/2016    Priority: High  . Adjustment disorder with mixed disturbance of emotions and conduct [F43.25] 04/15/2015    Priority: High  . Suicidal ideations [R45.851] 03/12/2015    Priority: High  . MDD (major depressive disorder), recurrent severe, without psychosis ([place]) [F33.2] 11/18/2015  . Homelessness [Z59.0] 11/18/2015  . Tobacco use disorder [F17.200] 07/05/2015  . Malnutrition of moderate degree ([place]) [E44.0] 05/31/2015  . Hypokalemia [E87.6] 05/30/2015  . Obstipation [K59.00] 05/30/2015  . Prolonged QT interval [R94.31] 05/30/2015  . Hypertension [I10] 05/30/2015  . Diabetes ([place]) [E11.9] 09/06/2012  . Paranoia ([place]) [F22] 04/16/2012   Total Time spent with patient: 30 minutes  Past Psychiatric History: depression, personality disorder  Past Medical History:  Past Medical History:  Diagnosis Date  . Anxiety   . Diabetes mellitus without complication ([place])   . Hypertension   . Major depressive disorder, recurrent, severe with psychotic features ([place])    History reviewed. No pertinent surgical history. Family History:  Family History  Problem Relation Age of Onset  . Hypertension Mother   . Diabetes Mother   . Aneurysm Mother   . Hypertension Father   . Diabetes Father   . Hypertension Sister   . Diabetes Sister    . Hypertension Other   . Diabetes Other    Family Psychiatric  History: none Social History:  Social History   Substance and Sexual Activity  Alcohol Use No     Social History   Substance and Sexual Activity  Drug Use No    Social History   Socioeconomic History  . Marital status: Single    Spouse name: None  . Number of children: None  . Years of education: None  . Highest education level: None  Social Needs  . Financial resource strain: None  . Food insecurity - worry: None  . Food insecurity - inability: None  . Transportation needs - medical: None  . Transportation needs - non-medical: None  Occupational History  . None  Tobacco Use  . Smoking status: Never Smoker  . Smokeless tobacco: Never Used  Substance and Sexual Activity  . Alcohol use: No  . Drug use: No  . Sexual activity: No  Other Topics Concern  . None  Social History Narrative  . None   Additional Social History:                         Sleep: Good  Appetite:  Good  Current Medications: Current Facility-Administered Medications  Medication Dose Route Frequency Provider Last Rate Last Dose  . asenapine (SAPHRIS) sublingual tablet 5 mg  5 mg Sublingual BID Lindon Romp A, NP      . OLANZapine zydis (ZYPREXA) disintegrating tablet 10 mg  10 mg Oral Q8H PRN Pennelope Bracken, MD   10 mg at 01/09/18 5643   And  .  LORazepam (ATIVAN) tablet 1 mg  1 mg Oral PRN Pennelope Bracken, MD       And  . ziprasidone (GEODON) injection 20 mg  20 mg Intramuscular PRN Pennelope Bracken, MD        Lab Results: No results found for this or any previous visit (from the past 48 hour(s)).  Blood Alcohol level:  Lab Results  Component Value Date   ETH <10 01/06/2018   ETH <10 93/81/0175    Metabolic Disorder Labs: Lab Results  Component Value Date   HGBA1C 6.1 (H) 07/05/2015   MPG 143 (H) 08/30/2012   No results found for: PROLACTIN Lab Results  Component Value Date    CHOL 223 (H) 07/05/2015   TRIG 109 07/05/2015   HDL 51 07/05/2015   CHOLHDL 4.4 07/05/2015   VLDL 22 07/05/2015   LDLCALC 150 (H) 07/05/2015   LDLCALC 141 (H) 08/30/2012    Physical Findings: AIMS: Facial and Oral Movements Muscles of Facial Expression: None, normal Lips and Perioral Area: None, normal Jaw: None, normal Tongue: None, normal,Extremity Movements Upper (arms, wrists, hands, fingers): None, normal Lower (legs, knees, ankles, toes): None, normal, Trunk Movements Neck, shoulders, hips: None, normal, Overall Severity Severity of abnormal movements (highest score from questions above): None, normal Incapacitation due to abnormal movements: None, normal Patient's awareness of abnormal movements (rate only patient's report): No Awareness, Dental Status Current problems with teeth and/or dentures?: No Does patient usually wear dentures?: No  CIWA:    COWS:     Musculoskeletal: Strength & Muscle Tone: within normal limits Gait & Station: normal Patient leans: N/A  Psychiatric Specialty Exam: Physical Exam  Constitutional: She is oriented to person, place, and time. She appears well-developed and well-nourished.  HENT:  Head: Normocephalic.  Neck: Normal range of motion.  Respiratory: Effort normal.  Musculoskeletal: Normal range of motion.  Neurological: She is alert and oriented to person, place, and time.  Psychiatric: Her speech is normal. Her mood appears anxious. Her affect is labile. She is agitated and aggressive. Cognition and memory are impaired. She expresses impulsivity and inappropriate judgment. She expresses homicidal ideation.    Review of Systems  Psychiatric/Behavioral: The patient is nervous/anxious.   All other systems reviewed and are negative.   Blood pressure 107/89, pulse (!) 135, temperature 98.5 F (36.9 C), temperature source Oral, resp. rate 18, height 5' 5.5" (1.664 m), weight 114.8 kg (253 lb), last menstrual period 10/29/2011, SpO2  100 %.Body mass index is 41.46 kg/m.  General Appearance: Casual  Eye Contact:  Fair  Speech:  Normal Rate  Volume:  Increased  Mood:  Angry and Irritable  Affect:  Blunt  Thought Process:  Coherent and Descriptions of Associations: Intact  Orientation:  Full (Time, Place, and Person)  Thought Content:  Delusions and Paranoid Ideation  Suicidal Thoughts:  No  Homicidal Thoughts:  Yes.  without intent/plan  Memory:  Immediate;   Fair Recent;   Fair Remote;   Fair  Judgement:  Impaired  Insight:  Lacking  Psychomotor Activity:  Increased  Concentration:  Concentration: Poor and Attention Span: Poor  Recall:  [employer] of Knowledge:  Fair  Language:  Good  Akathisia:  No  Handed:  Right  AIMS (if indicated):     Assets:  Housing Leisure Time Physical Health Resilience  ADL's:  Intact  Cognition:  Impaired,  Moderate  Sleep:  Number of Hours: 3.75     Treatment Plan Summary: Daily contact with patient to  assess and evaluate symptoms and progress in treatment, Medication management and Plan delusional disorder with bizarre content, multiple episodes in current acute phase:  Treatment Plan Summary: Daily contact with patient to assess and evaluate symptoms and progress in treatment and Medication management  -Continue inpatient hospitalization  - Delusional disorder           - Saphris increased from 5 mg BID to 10 mg BID    -Agitation            - IM medications (Zyprexa, Geodon, and/or Ativan) PRN in place, do not give Ativan with the Zyprexa due to significant drop in [facility]  - Encourage participation in groups and therapeutic milieu  -Disposition planning will be ongoing  Waylan Boga, NP 01/09/2018, 4:17 PM

## 2018-01-09 NOTE — Progress Notes (Signed)
Nursing Progress Note: 7p-7a D: Pt currently presents with a anxious/no initiation/minimal/disorganized affect and behavior. Pt talks slowly and softly to self in room and in hall. Interacting minimally with the milieu. Pt reports poor sleep during the previous night with current medication regimen. Pt did not attend wrap-up group.  A: Pt provided with medications per providers orders. Pt's labs and vitals were monitored throughout the night. Pt supported emotionally and encouraged to express concerns and questions. Pt educated on medications.  R: Pt's safety ensured with 15 minute and environmental checks. Pt currently denies SI, HI, and AVH. Pt verbally contracts to seek staff if SI,HI, or AVH occurs and to consult with staff before acting on any harmful thoughts. Will continue to monitor.

## 2018-01-09 NOTE — [facility] Group Notes (Signed)
[place] Group Notes:  (Nursing/MHT/Case Management/Adjunct)  Date:  01/09/2018  Time:  4:46 PM  Type of Therapy:  Psychoeducational Skills  Participation Level:  Did Not Attend    Coralyn Mark Gomer [place] 01/09/2018, 4:46 PM

## 2018-01-10 MED ORDER — OLANZAPINE 10 MG PO TABS: 10.0000 mg | ORAL_TABLET | Freq: Once | ORAL | Status: DC | PRN

## 2018-01-10 MED ORDER — DIPHENHYDRAMINE HCL 50 MG/ML IJ SOLN: 50.0000 mg | Freq: Once | INTRAMUSCULAR | Status: DC | PRN

## 2018-01-10 MED ORDER — OLANZAPINE 10 MG PO TABS
10.0000 mg | ORAL_TABLET | Freq: Two times a day (BID) | ORAL | Status: DC
  Administered 2018-01-10 – 2018-01-14 (×6): 10 mg via ORAL
  Filled 2018-01-10 (×5): qty 1
  Filled 2018-01-10: qty 14
  Filled 2018-01-10 (×2): qty 1
  Filled 2018-01-10: qty 14
  Filled 2018-01-10 (×5): qty 1

## 2018-01-10 NOTE — Progress Notes (Signed)
Pt refused morning dose of Saphris.

## 2018-01-10 NOTE — Progress Notes (Signed)
Nursing Progress Note: 7p-7a D: Pt currently presents with a anxious/no initiation/minimal/disorganized/delsuional affect and behavior. Pt talks slowly and softly to self in room and in hall. Interacting minimally with the milieu. Pt reports poor sleep during the previous night with current medication regimen. Pt did not attend wrap-up group.  A: Pt provided with medications per providers orders. Pt seen talking to herself.  Pt's labs and vitals were monitored throughout the night. Pt supported emotionally and encouraged to express concerns and questions. Pt educated on medications.  R: Pt's safety ensured with 15 minute and environmental checks. Pt currently denies SI, HI, and AVH but was seen talking to herself. Pt verbally contracts to seek staff if SI,HI, or AVH occurs and to consult with staff before acting on any harmful thoughts. Will continue to monitor.

## 2018-01-10 NOTE — Progress Notes (Signed)
Patient ID: [name], female   DOB: 21-Apr-1966, 52 y.o.   MRN: [id number]  [facility] MD Progress Note  01/10/2018 10:14 AM Malyia Moro  MRN:  [id number] Subjective:  Patient continues to stay in her room.  She came out last night and this morning to take Zyprexa that she requested.  No threats to anyone and snoring most of the day in her bed.  Her medications were changed from Saphris to Zyprexa since she is willing to take this medication.  Less irritable. Principal Problem: Delusional disorder, with bizarre content, multiple episodes currently in acute episode [facility]) Diagnosis:   Patient Active Problem List   Diagnosis Date Noted  . Delusional disorder, with bizarre content, multiple episodes currently in acute episode ([place]) [F22] 01/07/2018    Priority: High  . Anxiety [F41.9] 03/29/2016    Priority: High  . Adjustment disorder with mixed disturbance of emotions and conduct [F43.25] 04/15/2015    Priority: High  . Suicidal ideations [R45.851] 03/12/2015    Priority: High  . MDD (major depressive disorder), recurrent severe, without psychosis ([place]) [F33.2] 11/18/2015  . Homelessness [Z59.0] 11/18/2015  . Tobacco use disorder [F17.200] 07/05/2015  . Malnutrition of moderate degree ([place]) [E44.0] 05/31/2015  . Hypokalemia [E87.6] 05/30/2015  . Obstipation [K59.00] 05/30/2015  . Prolonged QT interval [R94.31] 05/30/2015  . Hypertension [I10] 05/30/2015  . Diabetes ([place]) [E11.9] 09/06/2012  . Paranoia ([place]) [F22] 04/16/2012   Total Time spent with patient: 30 minutes  Past Psychiatric History: depression, personality disorder  Past Medical History:  Past Medical History:  Diagnosis Date  . Anxiety   . Diabetes mellitus without complication ([place])   . Hypertension   . Major depressive disorder, recurrent, severe with psychotic features ([place])    History reviewed. No pertinent surgical history. Family History:  Family History  Problem Relation Age of Onset  . Hypertension Mother    . Diabetes Mother   . Aneurysm Mother   . Hypertension Father   . Diabetes Father   . Hypertension Sister   . Diabetes Sister   . Hypertension Other   . Diabetes Other    Family Psychiatric  History: none Social History:  Social History   Substance and Sexual Activity  Alcohol Use No     Social History   Substance and Sexual Activity  Drug Use No    Social History   Socioeconomic History  . Marital status: Single    Spouse name: None  . Number of children: None  . Years of education: None  . Highest education level: None  Social Needs  . Financial resource strain: None  . Food insecurity - worry: None  . Food insecurity - inability: None  . Transportation needs - medical: None  . Transportation needs - non-medical: None  Occupational History  . None  Tobacco Use  . Smoking status: Never Smoker  . Smokeless tobacco: Never Used  Substance and Sexual Activity  . Alcohol use: No  . Drug use: No  . Sexual activity: No  Other Topics Concern  . None  Social History Narrative  . None   Additional Social History:    Sleep: Good  Appetite:  Good  Current Medications: Current Facility-Administered Medications  Medication Dose Route Frequency Provider Last Rate Last Dose  . OLANZapine zydis (ZYPREXA) disintegrating tablet 10 mg  10 mg Oral Q8H PRN Pennelope Bracken, MD   10 mg at 01/10/18 0340   And  . LORazepam (ATIVAN) tablet 1 mg  1 mg Oral  PRN Pennelope Bracken, MD       And  . ziprasidone (GEODON) injection 20 mg  20 mg Intramuscular PRN Pennelope Bracken, MD      . OLANZapine (ZYPREXA) tablet 10 mg  10 mg Oral BID Patrecia Pour, NP        Lab Results: No results found for this or any previous visit (from the past 48 hour(s)).  Blood Alcohol level:  Lab Results  Component Value Date   ETH <10 01/06/2018   ETH <10 08/67/6195    Metabolic Disorder Labs: Lab Results  Component Value Date   HGBA1C 6.1 (H) 07/05/2015   MPG  143 (H) 08/30/2012   No results found for: PROLACTIN Lab Results  Component Value Date   CHOL 223 (H) 07/05/2015   TRIG 109 07/05/2015   HDL 51 07/05/2015   CHOLHDL 4.4 07/05/2015   VLDL 22 07/05/2015   LDLCALC 150 (H) 07/05/2015   LDLCALC 141 (H) 08/30/2012    Physical Findings: AIMS: Facial and Oral Movements Muscles of Facial Expression: None, normal Lips and Perioral Area: None, normal Jaw: None, normal Tongue: None, normal,Extremity Movements Upper (arms, wrists, hands, fingers): None, normal Lower (legs, knees, ankles, toes): None, normal, Trunk Movements Neck, shoulders, hips: None, normal, Overall Severity Severity of abnormal movements (highest score from questions above): None, normal Incapacitation due to abnormal movements: None, normal Patient's awareness of abnormal movements (rate only patient's report): No Awareness, Dental Status Current problems with teeth and/or dentures?: No Does patient usually wear dentures?: No  CIWA:    COWS:     Musculoskeletal: Strength & Muscle Tone: within normal limits Gait & Station: normal Patient leans: N/A  Psychiatric Specialty Exam: Physical Exam  Constitutional: She is oriented to person, place, and time. She appears well-developed and well-nourished.  HENT:  Head: Normocephalic.  Neck: Normal range of motion.  Respiratory: Effort normal.  Musculoskeletal: Normal range of motion.  Neurological: She is alert and oriented to person, place, and time.  Psychiatric: Her speech is normal and behavior is normal. Thought content normal. Her affect is labile. Cognition and memory are normal. She expresses impulsivity.    Review of Systems  Psychiatric/Behavioral: The patient is nervous/anxious.   All other systems reviewed and are negative.   Blood pressure 107/89, pulse (!) 135, temperature 98.5 F (36.9 C), temperature source Oral, resp. rate 18, height 5' 5.5" (1.664 m), weight 114.8 kg (253 lb), last menstrual period  10/29/2011, SpO2 100 %.Body mass index is 41.46 kg/m.  General Appearance: Casual  Eye Contact:  Fair  Speech:  Normal Rate  Volume:  Normal  Mood:  Irritable at times  Affect:  Blunt  Thought Process:  Coherent and Descriptions of Associations: Intact  Orientation:  Full (Time, Place, and Person)  Thought Content:  WDL  Suicidal Thoughts:  No  Homicidal Thoughts:  No  Memory:  Immediate;   Fair Recent;   Fair Remote;   Fair  Judgement:  Fair  Insight:  Lacking  Psychomotor Activity:  Normal  Concentration:  Concentration: Poor and Attention Span: Poor  Recall:  [employer] of Knowledge:  Fair  Language:  Good  Akathisia:  No  Handed:  Right  AIMS (if indicated):     Assets:  Housing Leisure Time Physical Health Resilience  ADL's:  Intact  Cognition:  Impaired,  Moderate  Sleep:  Number of Hours: 5.25     Treatment Plan Summary: Daily contact with patient to assess and evaluate symptoms  and progress in treatment, Medication management and Plan delusional disorder with bizarre content, multiple episodes in current acute phase:  Treatment Plan Summary: Daily contact with patient to assess and evaluate symptoms and progress in treatment and Medication management  -Continue inpatient hospitalization  - Delusional disorder           - Saphris discontinued           - Started Zyprexa 10 mg BID for psychosis   -Agitation            - Zyprexa 10 mg and Benadryl 50 mg PRN agitation  - Encourage participation in groups and therapeutic milieu  -Disposition planning will be ongoing  Sharine Cadle, Theodoro Clock, NP 01/10/2018, 10:14 AM

## 2018-01-10 NOTE — Progress Notes (Signed)
Pt presents with a flat affect and labile mood. Pt will not have much interaction with writer and often changes the subject or walk away. Pt denies AVH. Pt denies SI/HI. Pt refused morning med (Saphris) when offered. Pt med was then changed to Zyprexa per NP. Writer offered pt Zyprexa as scheduled, pt declined, stating that she will take it later before bed and that she was going back to sleep. Pt noted to be less agitated this morning and afternoon after taking Zyprexa at 3 am. Orders reviewed. Verbal support provided. Pt encouraged to attend groups. 15 minute checks performed for safety.

## 2018-01-10 NOTE — [facility] Group Notes (Signed)
[facility] Group Notes: (Clinical Social Work)   01/10/2018      Type of Therapy:  Group Therapy   Participation Level:  Did Not Attend despite MHT prompting   Selmer Dominion, LCSW 01/10/2018, 1:10 PM

## 2018-01-11 MED ORDER — HYDROXYZINE HCL 25 MG PO TABS
25.0000 mg | ORAL_TABLET | Freq: Four times a day (QID) | ORAL | Status: DC | PRN
  Administered 2018-01-11 – 2018-01-12 (×2): 25 mg via ORAL
  Filled 2018-01-11 (×2): qty 1
  Filled 2018-01-11 (×2): qty 10
  Filled 2018-01-11: qty 1

## 2018-01-11 MED ORDER — OLANZAPINE 5 MG PO TABS
5.0000 mg | ORAL_TABLET | Freq: Four times a day (QID) | ORAL | Status: DC | PRN
  Administered 2018-01-11 – 2018-01-12 (×2): 5 mg via ORAL
  Filled 2018-01-11 (×2): qty 2

## 2018-01-11 MED ORDER — OLANZAPINE 5 MG PO TABS: 5.0000 mg | ORAL_TABLET | Freq: Once | ORAL | Status: DC | PRN

## 2018-01-11 NOTE — Progress Notes (Signed)
Pt was heard in her room yelling and talking to herself. Pt appeared to be agitated with herself. Pt was offered prn meds. Pt agreed to taking meds. Pt then came to med window and continued talking to herself and agitated.
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
## 2018-01-11 NOTE — Progress Notes (Signed)
Pt presents with a flat affect and depressed mood. Pt thoughts are disorganized and speech is tangential at times. Pt appears to be responding to internal stimuli AEB talking to herself and paranoia. Pt mood is labile and pt can be irritable towards peers. Pt noted to be less agitation and aggressive this morning. Pt refused to take Zyprexa stating that it makes her sleepy and would like to take it only at bedtime. MD made aware of pt request. Verbal support provided. Pt encouraged to attend groups. 15 minute checks performed for safety.

## 2018-01-11 NOTE — Progress Notes (Signed)
Psychoeducational Group Note  Date:  01/11/2018 Time:  0926  Group Topic/Focus:  Goals Group:   The focus of this group is to help patients establish daily goals to achieve during treatment and discuss how the patient can incorporate goal setting into their daily lives to aide in recovery.  Participation Level: Did Not Attend  Participation Quality:  Not Applicable  Affect:  Not Applicable  Cognitive:  Not Applicable  Insight:  Not Applicable  Engagement in Group: Not Applicable  Additional Comments:  Pt refused to attend the Goals Group this morning.  Richard Holz E 01/11/2018, 9:26 AM

## 2018-01-11 NOTE — Progress Notes (Signed)
Adult Psychoeducational Group Note  Date:  01/11/2018 Time:  1600  Group Topic/Focus:  Goals Group:   The focus of this group is to help patients establish daily goals to achieve during treatment and discuss how the patient can incorporate goal setting into their daily lives to aide in recovery.  Participation Level:  Active  Participation Quality:  Appropriate  Affect:  Appropriate  Cognitive:  Appropriate  Insight: Appropriate  Engagement in Group:  Engaged  Modes of Intervention:  Discussion  Additional Comments:  Goal was to work on discharge planning.  Reesman, Mahek Schlesinger L

## 2018-01-11 NOTE — Progress Notes (Signed)
Recreation Therapy Notes  Date: 3.18.19 Time: 10:00 a.m.  Location: [address] Dayroom   Group Topic: Personal Development: Coping Skills   Goal Area(s) Addresses:  Goal 1.1: To improve coping skills  Group will increase awareness on coping skills  Group will identify at least three healthy coping skills they have   Group will be able to identify how coping skills can improve their wellness  Behavioral Response: Appropriate   Intervention: Craft  Activity: Coping Strategies Fortune Teller: Patients received a Coping Strategies Fortune Teller. Patients were given fifteen minutes to color and list their top 8 healthy coping strategies. Patients then cut out their fortune tellers and followed Recreation Therapy Intern instructions on how to assemble their fortune teller. Once put together, patients had the opportunity to practice their coping strategies by using their fortune tellers with a partner.   Education:Coping Skills   Education Outcome: Acknowledges Education  Clinical Observations/Feedback: Patient attended group but did not participate during group activity   Ranell Patrick, Recreation Therapy Intern  Ranell Patrick 01/11/2018 1:08 PM

## 2018-01-11 NOTE — B[facility] Group Notes (Signed)
.  LCSW Group Therapy Note   01/11/2018 1:15pm   Type of Therapy and Topic:  Group Therapy:  Overcoming Obstacles   Participation Level:  Minimal   Description of Group:    In this group patients will be encouraged to explore what they see as obstacles to their own wellness and recovery. They will be guided to discuss their thoughts, feelings, and behaviors related to these obstacles. The group will process together ways to cope with barriers, with attention given to specific choices patients can make. Each patient will be challenged to identify changes they are motivated to make in order to overcome their obstacles. This group will be process-oriented, with patients participating in exploration of their own experiences as well as giving and receiving support and challenge from other group members.   Therapeutic Goals: 1. Patient will identify personal and current obstacles as they relate to admission. 2. Patient will identify barriers that currently interfere with their wellness or overcoming obstacles.  3. Patient will identify feelings, thought process and behaviors related to these barriers. 4. Patient will identify two changes they are willing to make to overcome these obstacles:      Summary of Patient Progress  In and out of group several times.  Engaged while present.  Talked about how she is in the process of "researching the problems."  When others asked, clarified that she is finding out about getting compensated, which others interpreted as law suit, and in fact asked her, "Are you suing the hospital?"  Her reply?  "Everyone is named in the lawsuit."   Therapeutic Modalities:   Cognitive Behavioral Therapy Solution Focused Therapy Motivational Interviewing Relapse Prevention [place], LCSW 01/11/2018 3:46 PM

## 2018-01-12 NOTE — Progress Notes (Signed)
  D: Pt was reluctant to take her scheduled zyprexa. Offered at 2000, however, pt stated she wanted to take the med "later". Writer observed pt in the dayroom holding a full conversation with herself, then later began conversing with a peer. Later in the shift writer offered the zyprexa again. Pt informed the writer that the pkg didn't look like the package she'd gotten earlier today. Pt attempted to avoid the writer by going to bed before getting her med. However, later in the shift pt requested vasoline. Writer was able to encourage pt to take the zyprexa while waiting for the vasoline.  Pt has no questions or concerns.    A:  Support and encouragement was offered. 15 min checks continued for safety.  R: Pt remains safe.

## 2018-01-12 NOTE — Progress Notes (Addendum)
Recreation Therapy Notes  Date: 3.19.19 Time: 10:00 a.m.  Location: [address] Dayroom   Group Topic: Self-Expression, Radiographer, therapeutic, Leisure Education   Goal Area(s) Addresses:  - Patient will identify the importance of self-expression  - Patient will identify how to express themselves  - Patient will identify how art can be used as a Technical sales engineer  - Patient will report enjoyment and feeling of relaxation from activity  - Patient will participate in Recreation Therapy group treatment   Intervention: Art   Activity: Expressive Arts: Patients had the opportunity to express themselves through the use of art by painting their feelings or thoughts on a canvas   Education: Self-Expression, Radiographer, therapeutic, Leisure Education, Stress Management   Education Outcome: Acknowledges education  Clinical Observations/Feedback: Patient did not attend   Ranell Patrick, Recreation Therapy Intern  Ranell Patrick 01/12/2018 12:09 PM
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
## 2018-01-12 NOTE — Progress Notes (Signed)
Adult Psychoeducational Group Note  Date:  01/12/2018 Time:  9:06 PM  Group Topic/Focus:  Wrap-Up Group:   The focus of this group is to help patients review their daily goal of treatment and discuss progress on daily workbooks.  Participation Level:  Active  Participation Quality:  Appropriate  Affect:  Appropriate  Cognitive:  Appropriate  Insight: Appropriate  Engagement in Group:  Engaged  Modes of Intervention:  Discussion  Additional Comments:  The patient expressed that she enjoyed that forgiveness group.  Nash Shearer 01/12/2018, 9:06 PM

## 2018-01-12 NOTE — Progress Notes (Signed)
  DATA ACTION RESPONSE  Objective- Pt. is visible in the dayroom, seen talking on the phone. Pt attended and participated in wrap-up group this evening.  Presents with a labile     affect and mood.Pt appears to be responding to internal stimuli; seen talking to self. Pt took scheduled Zyprexa after much encouragement.  Subjective- Denies having any SI/HI/AVH/Pain at this time. Is cooperative and remains safe on the unit.  1:1 interaction in private to establish rapport. Encouragement, education, & support given from staff.    Safety maintained with Q 15 checks. Continue with POC.

## 2018-01-12 NOTE — [facility] Group Notes (Signed)
LCSW Group Therapy Note  01/12/2018 1:15pm  Type of Therapy and Topic: Group Therapy: Holding on to Grudges   Participation Level: Active   Description of Group:  In this group patients will be asked to explore and define a grudge. Patients will be guided to discuss their thoughts, feelings, and reasons as to why people have grudges. Patients will process the impact grudges have on daily life and identify thoughts and feelings related to holding grudges. Facilitator will challenge patients to identify ways to let go of grudges and the benefits this provides. Patients will be confronted to address why one struggles letting go of grudges. Lastly, patients will identify feelings and thoughts related to what life would look like without grudges. This group will be process-oriented, with patients participating in exploration of their own experiences, giving and receiving support, and processing challenge from other group members.  Therapeutic Goals:  1. Patient will identify specific grudges related to their personal life.  2. Patient will identify feelings, thoughts, and beliefs around grudges.  3. Patient will identify how one releases grudges appropriately.  4. Patient will identify situations where they could have let go of the grudge, but instead chose to hold on.   Summary of Patient Progress:  Stayed the entire time, engaged throughout.  Appeared to be crying to herself when another patient was talking about forgiveness of self.  Later talked about being able to forgive "70X7, and sometimes you just have to move to another state."   Therapeutic Modalities:  Cognitive Behavioral Therapy  Taylor, LCSW 01/12/2018 1:25 PM

## 2018-01-12 NOTE — Progress Notes (Signed)
Pt presents with a flat affect and labile mood. Pt thoughts are disorganized and speech is tangential at times. Pt appears to be responding to internal stimuli AEB talking to herself, delusions and paranoia. Pt observed talking to herself throughout the day. Pt stated that she had to leave group this afternoon because nursing student were present and that she is currently suing an employee at [employer] and doesn't feel comfortable around them.Pt noted to be less agitation and aggressive this afternoon. Pt refused to take Zyprexa stating that it makes her sleepy and would like to take it only at bedtime. MD made aware of pt request. Verbal support provided. Pt encouraged to attend groups. 15 minute checks performed for safety

## 2018-01-13 MED ORDER — OLANZAPINE 10 MG PO TBDP: 10.0000 mg | ORAL_TABLET | Freq: Three times a day (TID) | ORAL | Status: DC | PRN

## 2018-01-13 MED ORDER — LORAZEPAM 1 MG PO TABS: 1.0000 mg | ORAL_TABLET | ORAL | Status: DC | PRN

## 2018-01-13 MED ORDER — ZIPRASIDONE MESYLATE 20 MG IM SOLR: 20.0000 mg | INTRAMUSCULAR | Status: DC | PRN

## 2018-01-13 NOTE — Tx Team (Signed)
Interdisciplinary Treatment and Diagnostic Plan Update  01/13/2018 Time of Session: 3:24 PM  [name] MRN: [id number]  Principal Diagnosis: Delusional disorder, with bizarre content, multiple episodes currently in acute episode [facility])  Secondary Diagnoses: Principal Problem:   Delusional disorder, with bizarre content, multiple episodes currently in acute episode ([place])   Current Medications:  Current Facility-Administered Medications  Medication Dose Route Frequency Provider Last Rate Last Dose  . diphenhydrAMINE (BENADRYL) injection 50 mg  50 mg Intramuscular Once PRN Patrecia Pour, NP      . hydrOXYzine (ATARAX/VISTARIL) tablet 25 mg  25 mg Oral Q6H PRN Artist Beach, MD   25 mg at 01/12/18 1708  . OLANZapine zydis (ZYPREXA) disintegrating tablet 10 mg  10 mg Oral Q8H PRN Pennelope Bracken, MD       And  . LORazepam (ATIVAN) tablet 1 mg  1 mg Oral PRN Pennelope Bracken, MD       And  . ziprasidone (GEODON) injection 20 mg  20 mg Intramuscular PRN Pennelope Bracken, MD      . OLANZapine (ZYPREXA) tablet 10 mg  10 mg Oral BID Patrecia Pour, NP   10 mg at 01/13/18 1008    PTA Medications: Medications Prior to Admission  Medication Sig Dispense Refill Last Dose  . asenapine (SAPHRIS) 5 MG SUBL 24 hr tablet Place 1 tablet (5 mg total) under the tongue 2 (two) times daily. (Patient not taking: Reported on 08/14/2017) 60 tablet 0 Not Taking at Unknown time  . fluticasone (FLONASE) 50 MCG/ACT nasal spray Place 1 spray into both nostrils daily. (Patient not taking: Reported on 08/14/2017) 16 g 0 Not Taking at Unknown time  . meclizine (ANTIVERT) 25 MG tablet Take 1 tablet (25 mg total) by mouth 3 (three) times daily as needed for dizziness. (Patient not taking: Reported on 01/06/2018) 30 tablet 0 Completed Course at Unknown time    Patient Stressors: Legal issue Other: delusional  Patient Strengths: Capable of independent living Physical Health Work  skills  Treatment Modalities: Medication Management, Group therapy, Case management,  1 to 1 session with clinician, Psychoeducation, Recreational therapy.   Physician Treatment Plan for Primary Diagnosis: Delusional disorder, with bizarre content, multiple episodes currently in acute episode ([place]) Long Term Goal(s): Improvement in symptoms so as ready for discharge  Short Term Goals: Ability to identify changes in lifestyle to reduce recurrence of condition will improve Compliance with prescribed medications will improve  Medication Management: Evaluate patient's response, side effects, and tolerance of medication regimen.  Therapeutic Interventions: 1 to 1 sessions, Unit Group sessions and Medication administration.  Evaluation of Outcomes: Adequate for Discharge  Physician Treatment Plan for Secondary Diagnosis: Principal Problem:   Delusional disorder, with bizarre content, multiple episodes currently in acute episode ([place])   Long Term Goal(s): Improvement in symptoms so as ready for discharge  Short Term Goals: Ability to identify changes in lifestyle to reduce recurrence of condition will improve Compliance with prescribed medications will improve  Medication Management: Evaluate patient's response, side effects, and tolerance of medication regimen.  Therapeutic Interventions: 1 to 1 sessions, Unit Group sessions and Medication administration.  Evaluation of Outcomes: Adequate for Discharge   RN Treatment Plan for Primary Diagnosis: Delusional disorder, with bizarre content, multiple episodes currently in acute episode ([place]) Long Term Goal(s): Knowledge of disease and therapeutic regimen to maintain health will improve  Short Term Goals: Ability to identify and develop effective coping behaviors will improve and Compliance with prescribed medications will improve  Medication Management: RN will administer medications as ordered by provider, will assess and evaluate  patient's response and provide education to patient for prescribed medication. RN will report any adverse and/or side effects to prescribing provider.  Therapeutic Interventions: 1 on 1 counseling sessions, Psychoeducation, Medication administration, Evaluate responses to treatment, Monitor vital signs and CBGs as ordered, Perform/monitor CIWA, COWS, AIMS and Fall Risk screenings as ordered, Perform wound care treatments as ordered.  Evaluation of Outcomes: Adequate for Discharge   LCSW Treatment Plan for Primary Diagnosis: Delusional disorder, with bizarre content, multiple episodes currently in acute episode [facility]) Long Term Goal(s): Safe transition to appropriate next level of care at discharge, Engage patient in therapeutic group addressing interpersonal concerns.  Short Term Goals: Engage patient in aftercare planning with referrals and resources  Therapeutic Interventions: Assess for all discharge needs, 1 to 1 time with Social worker, Explore available resources and support systems, Assess for adequacy in community support network, Educate family and significant other(s) on suicide prevention, Complete Psychosocial Assessment, Interpersonal group therapy.  Evaluation of Outcomes: Met  Pt will d/c to previous situation, likely avoid any follow up appointments   Progress in Treatment: Attending groups: Yes Participating in groups: Yes Taking medication as prescribed: Yes Toleration medication: Yes, no side effects reported at this time Family/Significant other contact made: No Patient understands diagnosis: No Limited insight Discussing patient identified problems/goals with staff: Yes Medical problems stabilized or resolved: Yes Denies suicidal/homicidal ideation: Yes Issues/concerns per patient self-inventory: None Other: N/A  New problem(s) identified: None identified at this time.   New Short Term/Long Term Goal(s): "I need to be checked for cyberware." Pt will not sign the  treatment plan. Discharge Plan or Barriers:   Reason for Continuation of Hospitalization:  Medication stabilization   Estimated Length of Stay: Likely d/c tomorrow  Attendees: Patient:  01/13/2018  3:24 PM  Physician: Maris Berger, MD 01/13/2018  3:24 PM  Nursing: Sena Hitch, RN 01/13/2018  3:24 PM  RN Care Manager: Lars Pinks, RN 01/13/2018  3:24 PM  Social Worker: Ripley Fraise 01/13/2018  3:24 PM  Recreational Therapist: Winfield Cunas 01/13/2018  3:24 PM  Other: Norberto Sorenson 01/13/2018  3:24 PM  Other:  01/13/2018  3:24 PM    Scribe for Treatment Team:  Roque Lias LCSW 01/13/2018 3:24 PM

## 2018-01-13 NOTE — Progress Notes (Signed)
Patient denies SI, HI and AVH this shift.  Patient made multiple requests to be discharged and needed much encouragement to take medications.  Though patient denies AVH patient was noted to be talking to unseen other loudly in her room.  Patient is often seen pacing the halls and muttering to self.  Patient continues to endorse the belief that cyberware has been implanted in her body.  In conversation patient is noted to be hyperverbal, pressured, and engaged in irrational conversation.   Assess patient for safety, offer medications as prescribed, monitor for effectiveness of medications, encourage patient to attend groups, orient patient to reality.   Continue to monitor as planned.   Patient able to contract for safety. Patient's safety maintained throughout shift.

## 2018-01-13 NOTE — Progress Notes (Signed)
Adult Psychoeducational Group Note  Date:  01/13/2018 Time:  8:31 PM  Group Topic/Focus:  Wrap-Up Group:   The focus of this group is to help patients review their daily goal of treatment and discuss progress on daily workbooks.  Participation Level:  Active  Participation Quality:  Appropriate  Affect:  Appropriate  Cognitive:  Oriented  Insight: Limited  Engagement in Group:  Engaged  Modes of Intervention:  Socialization  Additional Comments:  Patient attended and participated in group tonight. She reports that she slept most of the day today.  She advised that she was ready to go home. She will be leaving to leaving tomorrow. Her plans are to get a medical checkup and try to find part-time employment.  She plan to receive services through [facility].  [name] 01/13/2018, 8:31 PM

## 2018-01-13 NOTE — Progress Notes (Signed)
Recreation Therapy Notes  Date: 3.20.19 Time: 10:00 am Location: [address] Dayroom   Group Topic: Self Esteem   Goal Area(s) Addresses:  To increase self esteem: Patients will be able to identify five strengths that will improve self-esteem by the end of Recreation Therapy session.  To increase self-awareness: Patients will be able to identify five things that they like about themselves to increase self-awareness by the of Recreation Therapy session.   Intervention: Craft    Activity: Brochure About Me: Patients created a brochure about themselves listing: best features, proudest moments, strengths, favorite activities, personal note, and things they like about themselves to increase self-esteem   Education: Self-Esteem, Discharge Planning   Education Outcome: Acknowledges education  Clinical Observations/Feedback: Patient did not attend       Ranell Patrick, Recreation Therapy Intern  Ranell Patrick 01/13/2018 11:42 AM
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
## 2018-01-14 MED ORDER — OLANZAPINE 10 MG PO TABS: 10.0000 mg | ORAL_TABLET | Freq: Two times a day (BID) | ORAL | 0 refills | Status: DC

## 2018-01-14 MED ORDER — HYDROXYZINE HCL 25 MG PO TABS: 25.0000 mg | ORAL_TABLET | Freq: Four times a day (QID) | ORAL | 0 refills | Status: DC | PRN
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
## 2018-01-14 NOTE — Progress Notes (Signed)
  D: Writer observed pt in the day room for several hours talking to herself. However, pt denies A/V. Pt was apprehensive today with the writer as with our previous interaction. Pt has no questions or concerns.    A:  Support and encouragement was offered. 15 min checks continued for safety.  R: Pt remains safe.

## 2018-01-14 NOTE — BHH Suicide Risk Assessment (Signed)
[facility] INPATIENT:  Family/Significant Other Suicide Prevention Education  Suicide Prevention Education:  Patient Refusal for Family/Significant Other Suicide Prevention Education: The patient [name] has refused to provide written consent for family/significant other to be provided Family/Significant Other Suicide Prevention Education during admission and/or prior to discharge.  Physician notified.  Bowman 01/14/2018, 11:37 AM

## 2018-01-14 NOTE — Progress Notes (Signed)
Patient ID: [name], female   DOB: January 08, 1966, 52 y.o.   MRN: [id number] Patient discharged to home/self care.  Patient refused to sign any documentation related to discharge and belongings stating.  "I am not signing anything.  I don't want anything that you have to give me except for my bus pass"  Patient denies SI, HI and AVH upon discharge.

## 2018-01-14 NOTE — Progress Notes (Signed)
  [facility] Adult Case Management Discharge Plan :  Will you be returning to the same living situation after discharge:  Yes,  home At discharge, do you have transportation home?: Yes,  bus pass Do you have the ability to pay for your medications: Yes,  mental health  Release of information consent forms completed and in the chart;  Patient's signature needed at discharge.  Patient to Follow up at: Follow-up Information    [facility].   Specialty:  Professional Counselor Why:  Go to the walk-in clinic between 9 and 12 and 1 and 4 within 3 days of d/c for your hospital follow up appointment.   Contact information: [facility] [place] [address] [phone]           Next level of care provider has access to [place] and Suicide Prevention discussed: Yes,  yes  Have you used any form of tobacco in the last 30 days? (Cigarettes, Smokeless Tobacco, Cigars, and/or Pipes): No  Has patient been referred to the Quitline?: N/A patient is not a smoker  Patient has been referred for addiction treatment: [place], LCSW 01/14/2018, 11:42 AM

## 2018-01-14 NOTE — Progress Notes (Signed)
Recreation Therapy Notes   Date: 3.21.19 Time: 10:00 am  Location: [address] Dayroom   Group Topic: Decision Making   Goal Area(s) Addresses:  Goal 1.1 To improve decision making  - Group will identify the importance of decision making  - Group will identify how choices influence decision making - Group will understand the importance of communication   Intervention: Game  Activity: Patients were given a scenario where they had to decide who will receive a new heart from a transplant list. Patients had to individually decided their rankings as well as make a decision as a group   Education: [employer], Communication, Critical Thinking   Education Outcome: Acknowledges Education  Clinical Observations/Feedback: Patient did not attend   Ranell Patrick, Recreation Therapy Intern  Ranell Patrick 01/14/2018 9:15 AM

## 2018-01-15 ENCOUNTER — Ambulatory Visit: Payer: Self-pay | Admitting: Family Medicine

## 2018-01-15 NOTE — Progress Notes (Signed)
Recreation Therapy Notes  INPATIENT RECREATION TR PLAN  Patient Details Name: [name] MRN: [id number] DOB: Apr 25, 1966 Today's Date: 01/15/2018  Rec Therapy Plan Is patient appropriate for Therapeutic Recreation?: Yes Treatment times per week: At least three  Estimated Length of Stay: 5-7 days  TR Treatment/Interventions: Group participation (Aopropriate participation in [place] group tx.)  Discharge Criteria Pt will be discharged from therapy if:: Discharged Treatment plan/goals/alternatives discussed and agreed upon by:: Patient/family  Discharge Summary Short term goals set: See Care Plan  Short term goals met: Not met Reason goals not met: Patient did not attend groups  Reason patient discharged from therapy: Discharge from hospital Pt/family agrees with progress & goals achieved: Yes Date patient discharged from therapy: 01/14/18  Ranell Patrick, Recreation Therapy Intern    Ranell Patrick 01/15/2018, 12:51 PM

## 2018-02-06 ENCOUNTER — Encounter (HOSPITAL_COMMUNITY): Payer: Self-pay

## 2018-02-06 ENCOUNTER — Other Ambulatory Visit: Payer: Self-pay

## 2018-02-06 ENCOUNTER — Emergency Department (HOSPITAL_COMMUNITY)
Admission: EM | Admit: 2018-02-06 | Discharge: 2018-02-06 | Disposition: A | Discharge status: Home / Self Care | Payer: Self-pay | Attending: Emergency Medicine | Admitting: Emergency Medicine

## 2018-02-06 DIAGNOSIS — I10 Essential (primary) hypertension: Secondary | ICD-10-CM | POA: Insufficient documentation

## 2018-02-06 DIAGNOSIS — R42 Dizziness and giddiness: Secondary | ICD-10-CM | POA: Insufficient documentation

## 2018-02-06 DIAGNOSIS — E119 Type 2 diabetes mellitus without complications: Secondary | ICD-10-CM | POA: Insufficient documentation

## 2018-02-06 DIAGNOSIS — Z79899 Other long term (current) drug therapy: Secondary | ICD-10-CM | POA: Insufficient documentation

## 2018-02-06 LAB — COMPREHENSIVE METABOLIC PANEL
ALT: 19 U/L (ref 14–54)
AST: 23 U/L (ref 15–41)
Albumin: 4 g/dL (ref 3.5–5.0)
Alkaline Phosphatase: 130 U/L — ABNORMAL HIGH (ref 38–126)
Anion gap: 7 (ref 5–15)
BUN: 15 mg/dL (ref 6–20)
CO2: 21 mmol/L — ABNORMAL LOW (ref 22–32)
Calcium: 9 mg/dL (ref 8.9–10.3)
Chloride: 108 mmol/L (ref 101–111)
Creatinine, Ser: 0.83 mg/dL (ref 0.44–1.00)
GFR calc Af Amer: >60 mL/min (ref >60)
GFR calc non Af Amer: >60 mL/min (ref >60)
Glucose, Bld: 99 mg/dL (ref 65–99)
Potassium: 4.2 mmol/L (ref 3.5–5.1)
Sodium: 136 mmol/L (ref 135–145)
Total Bilirubin: 0.4 mg/dL (ref 0.3–1.2)
Total Protein: 7.4 g/dL (ref 6.5–8.1)

## 2018-02-06 LAB — CBC WITH DIFFERENTIAL/PLATELET
Basophils Absolute: 0 10*3/uL (ref 0.0–0.1)
Basophils Relative: 0 %
Eosinophils Absolute: 0.1 10*3/uL (ref 0.0–0.7)
Eosinophils Relative: 3 %
HCT: 37.2 % (ref 36.0–46.0)
Hemoglobin: 11.9 g/dL — ABNORMAL LOW (ref 12.0–15.0)
Lymphocytes Relative: 41 %
Lymphs Abs: 2.2 10*3/uL (ref 0.7–4.0)
MCH: 25.7 pg — ABNORMAL LOW (ref 26.0–34.0)
MCHC: 32 g/dL (ref 30.0–36.0)
MCV: 80.3 fL (ref 78.0–100.0)
Monocytes Absolute: 0.3 10*3/uL (ref 0.1–1.0)
Monocytes Relative: 5 %
Neutro Abs: 2.8 10*3/uL (ref 1.7–7.7)
Neutrophils Relative %: 51 %
Platelets: 296 10*3/uL (ref 150–400)
RBC: 4.63 MIL/uL (ref 3.87–5.11)
RDW: 13.9 % (ref 11.5–15.5)
WBC: 5.5 10*3/uL (ref 4.0–10.5)

## 2018-02-06 LAB — URINALYSIS, ROUTINE W REFLEX MICROSCOPIC
Bilirubin Urine: NEGATIVE
Glucose, UA: NEGATIVE mg/dL
Hgb urine dipstick: NEGATIVE
Ketones, ur: NEGATIVE mg/dL
Leukocytes, UA: NEGATIVE
Nitrite: NEGATIVE
Protein, ur: NEGATIVE mg/dL
Specific Gravity, Urine: 1.014 (ref 1.005–1.030)
pH: 5 (ref 5.0–8.0)

## 2018-02-06 LAB — RAPID URINE DRUG SCREEN, HOSP PERFORMED
Amphetamines: NOT DETECTED
Barbiturates: NOT DETECTED
Benzodiazepines: NOT DETECTED
Cocaine: NOT DETECTED
Opiates: NOT DETECTED
Tetrahydrocannabinol: NOT DETECTED

## 2018-02-06 NOTE — ED Provider Notes (Signed)
[place] EMERGENCY DEPARTMENT Provider Note   CSN: [id number] Arrival date & time: 02/06/18  1115     History   Chief Complaint Chief Complaint  Patient presents with  . Near Syncope    HPI [name] is a 52 y.o. female.  HPI Patient presents to the emergency department with multiple complaints.  The patient states she has had some dizziness and chills she is also had some body aches she states that I feel like I am going to die.  The patient states she cannot quantify what this means.  She states she does not have any significant pains at this time.  Patient is not a very good historian and speaks very abstractly.  Patient is very vague along with this.  The patient denies chest pain, shortness of breath, headache,blurred vision, neck pain, fever, cough, weakness, numbness,anorexia, edema, abdominal pain, nausea, vomiting, diarrhea, rash, back pain, dysuria, hematemesis, bloody stool, near syncope, or syncope. Past Medical History:  Diagnosis Date  . Anxiety   . Diabetes mellitus without complication ([place])   . Hypertension   . Major depressive disorder, recurrent, severe with psychotic features [facility])     Patient Active Problem List   Diagnosis Date Noted  . Delusional disorder, with bizarre content, multiple episodes currently in acute episode ([place]) 01/07/2018  . Anxiety 03/29/2016  . MDD (major depressive disorder), recurrent severe, without psychosis ([place]) 11/18/2015  . Homelessness 11/18/2015  . Tobacco use disorder 07/05/2015  . Malnutrition of moderate degree ([place]) 05/31/2015  . Hypokalemia 05/30/2015  . Obstipation 05/30/2015  . Prolonged QT interval 05/30/2015  . Hypertension 05/30/2015  . 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 Suicidal ideations 03/12/2015  . Diabetes ([place]) 09/06/2012  . Paranoia ([place]) 04/16/2012    History reviewed. No pertinent surgical history.   OB History   None       Home Medications    Prior to Admission medications   Medication Sig Start Date End Date Taking? Authorizing Provider  hydrOXYzine (ATARAX/VISTARIL) 25 MG tablet Take 1 tablet (25 mg total) by mouth every 6 (six) hours as needed for anxiety. 01/14/18   Lindell Spar I, NP  OLANZapine (ZYPREXA) 10 MG tablet Take 1 tablet (10 mg total) by mouth 2 (two) times daily. For mood control 01/14/18   Encarnacion Slates, NP    Family History Family History  Problem Relation Age of Onset  . Hypertension Mother   . Diabetes Mother   . Aneurysm Mother   . Hypertension Father   . Diabetes Father   . Hypertension Sister   . Diabetes Sister   . Hypertension Other   . Diabetes Other     Social History Social History   Tobacco Use  . Smoking status: Never Smoker  . Smokeless tobacco: Never Used  Substance Use Topics  . Alcohol use: No  . Drug use: No     Allergies   Patient has no known allergies.   Review of Systems Review of Systems All other systems negative except as documented in the HPI. All pertinent positives and negatives as reviewed in the HPI.  Physical Exam Updated Vital Signs BP (!) 135/91   Pulse 67   Temp 98.4 F (36.9 C) (Oral)   Resp 18   Ht 5\' 5"  (1.651 m)   Wt 111.1 kg (245 lb)   LMP 10/29/2011 Comment: NEG U PREG 02/24/17  SpO2 100%   BMI 40.77 kg/m   Physical Exam  Constitutional: She is oriented to person, place, and time. She appears well-developed and well-nourished. No distress.  HENT:  Head: Normocephalic and atraumatic.  Mouth/Throat: Oropharynx is clear and moist.  Eyes: Pupils are equal, round, and reactive to light.  Neck: Normal range of motion. Neck supple.  Cardiovascular: Normal rate, regular rhythm and normal heart sounds. Exam reveals no gallop and no friction rub.  No murmur heard. Pulmonary/Chest: Effort normal and breath sounds normal. No respiratory distress. She has no wheezes.  Abdominal: Soft. Bowel sounds are normal. She  exhibits no distension. There is no tenderness.  Neurological: She is alert and oriented to person, place, and time. She exhibits normal muscle tone. Coordination normal.  Skin: Skin is warm and dry. Capillary refill takes less than 2 seconds. No rash noted. No erythema.  Psychiatric: Her behavior is normal. Her mood appears anxious. Her speech is tangential. Thought content is paranoid and delusional.  Nursing note and vitals reviewed.    ED Treatments / Results  Labs (all labs ordered are listed, but only abnormal results are displayed) Labs Reviewed  COMPREHENSIVE METABOLIC PANEL - Abnormal; Notable for the following components:      Result Value   CO2 21 (*)    Alkaline Phosphatase 130 (*)    All other components within normal limits  CBC WITH DIFFERENTIAL/PLATELET - Abnormal; Notable for the following components:   Hemoglobin 11.9 (*)    MCH 25.7 (*)    All other components within normal limits  URINALYSIS, ROUTINE W REFLEX MICROSCOPIC  RAPID URINE DRUG SCREEN, HOSP PERFORMED    EKG None  Radiology No results found.  Procedures Procedures (including critical care time)  Medications Ordered in ED Medications - No data to display   Initial Impression / Assessment and Plan / ED Course  I have reviewed the triage vital signs and the nursing notes.  Pertinent labs & imaging results that were available during my care of the patient were reviewed by me and considered in my medical decision making (see chart for details).     There is no clear definitive reason for the patient's multiple complaints.  I do feel that this is a more psych related component.  However the patient follow-up with her primary doctor along with her psychiatrist.  Final Clinical Impressions(s) / ED Diagnoses   Final diagnoses:  None    ED Discharge Orders    None       Rebeca Allegra 02/06/18 1623    Carmin Muskrat, MD 02/07/18 [phone]

## 2018-02-06 NOTE — ED Triage Notes (Addendum)
Per EMS, Pt endorses dizziness and chills since this morning, has been seen at our facility for same multiple times. Has long hx of behavioral issues. Pt states "I feel like I'm going to die" Not on any medications. Denies SI/HI. Appears anxious. VSS. No neuro deficits and no pain.

## 2018-02-06 NOTE — ED Notes (Signed)
Pt aware of need for urine  

## 2018-02-06 NOTE — Discharge Instructions (Addendum)
Return here as needed. Follow up with your doctor. °

## 2018-02-16 ENCOUNTER — Emergency Department (HOSPITAL_COMMUNITY): Payer: Self-pay

## 2018-02-16 ENCOUNTER — Emergency Department (HOSPITAL_COMMUNITY)
Admission: EM | Admit: 2018-02-16 | Discharge: 2018-02-16 | Disposition: A | Discharge status: Home / Self Care | Payer: Self-pay | Attending: Emergency Medicine | Admitting: Emergency Medicine

## 2018-02-16 DIAGNOSIS — E119 Type 2 diabetes mellitus without complications: Secondary | ICD-10-CM | POA: Insufficient documentation

## 2018-02-16 DIAGNOSIS — E860 Dehydration: Secondary | ICD-10-CM | POA: Insufficient documentation

## 2018-02-16 DIAGNOSIS — Z79899 Other long term (current) drug therapy: Secondary | ICD-10-CM | POA: Insufficient documentation

## 2018-02-16 DIAGNOSIS — I10 Essential (primary) hypertension: Secondary | ICD-10-CM | POA: Insufficient documentation

## 2018-02-16 LAB — CBC
HCT: 37.3 % (ref 36.0–46.0)
HEMOGLOBIN: 11.9 g/dL — AB (ref 12.0–15.0)
MCH: 25.8 pg — ABNORMAL LOW (ref 26.0–34.0)
MCHC: 31.9 g/dL (ref 30.0–36.0)
MCV: 80.7 fL (ref 78.0–100.0)
Platelets: 262 10*3/uL (ref 150–400)
RBC: 4.62 MIL/uL (ref 3.87–5.11)
RDW: 14.1 % (ref 11.5–15.5)
WBC: 5.2 10*3/uL (ref 4.0–10.5)

## 2018-02-16 LAB — URINALYSIS, ROUTINE W REFLEX MICROSCOPIC
Bilirubin Urine: NEGATIVE
GLUCOSE, UA: NEGATIVE mg/dL
Hgb urine dipstick: NEGATIVE
Ketones, ur: 20 mg/dL — AB
LEUKOCYTES UA: NEGATIVE
NITRITE: NEGATIVE
PH: 5 (ref 5.0–8.0)
Protein, ur: NEGATIVE mg/dL
Specific Gravity, Urine: 1.024 (ref 1.005–1.030)

## 2018-02-16 LAB — BASIC METABOLIC PANEL
ANION GAP: 10 (ref 5–15)
BUN: 24 mg/dL — ABNORMAL HIGH (ref 6–20)
CHLORIDE: 109 mmol/L (ref 101–111)
CO2: 19 mmol/L — ABNORMAL LOW (ref 22–32)
Calcium: 9.1 mg/dL (ref 8.9–10.3)
Creatinine, Ser: 0.94 mg/dL (ref 0.44–1.00)
GFR calc Af Amer: >60 mL/min (ref >60)
GFR calc non Af Amer: >60 mL/min (ref >60)
Glucose, Bld: 94 mg/dL (ref 65–99)
POTASSIUM: 4.1 mmol/L (ref 3.5–5.1)
SODIUM: 138 mmol/L (ref 135–145)

## 2018-02-16 LAB — CBG MONITORING, ED: Glucose-Capillary: 75 mg/dL (ref 65–99)

## 2018-02-16 LAB — ETHANOL

## 2018-02-16 LAB — RAPID URINE DRUG SCREEN, HOSP PERFORMED
Amphetamines: NOT DETECTED
BENZODIAZEPINES: NOT DETECTED
Barbiturates: NOT DETECTED
COCAINE: NOT DETECTED
Opiates: NOT DETECTED
Tetrahydrocannabinol: NOT DETECTED

## 2018-02-16 MED ORDER — SODIUM CHLORIDE 0.9 % IV BOLUS
1000.0000 mL | Freq: Once | INTRAVENOUS | Status: AC
  Administered 2018-02-16: 1000 mL via INTRAVENOUS
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
## 2018-02-16 NOTE — ED Provider Notes (Addendum)
[name] is a 52 y.o. female, with a history of HTN, DM, MDD with psychosis, and anxiety, presenting to the ED with generalized weakness.   Past Medical History:  Diagnosis Date  . Anxiety   . Diabetes mellitus without complication ([place])   . Hypertension   . Major depressive disorder, recurrent, severe with psychotic features ([place])     Physical Exam  BP 102/75   Pulse (!) 58   Temp 98.2 F (36.8 C) (Oral)   Resp 13   Ht 5\' 5"  (1.651 m)   Wt 111.1 kg (245 lb)   LMP 10/29/2011 Comment: NEG U PREG 02/24/17  SpO2 100%   BMI 40.77 kg/m   Physical Exam  Constitutional: She is oriented to person, place, and time. She appears well-developed and well-nourished. No distress.  HENT:  Head: Normocephalic and atraumatic.  Eyes: Pupils are equal, round, and reactive to light. Conjunctivae and EOM are normal.  Neck: Neck supple.  Cardiovascular: Normal rate, regular rhythm, normal heart sounds and intact distal pulses.  Pulmonary/Chest: Effort normal and breath sounds normal. No respiratory distress.  Abdominal: Soft. There is no tenderness. There is no guarding.  Musculoskeletal: She exhibits no edema.  Lymphadenopathy:    She has no cervical adenopathy.  Neurological: She is alert and oriented to person, place, and time.  Patient initially would only look at me when I spoke, but would not speak back.  She would follow some commands.  After a while, however, patient was fully conversational and compliant with exam:  No sensory deficits.  No noted speech deficits. No aphasia. Patient handles oral secretions without difficulty. No noted swallowing defects.  Equal grip strength bilaterally. Strength 5/5 in the upper extremities. Strength 5/5 with flexion and extension of the hips, knees, and ankles bilaterally.  Negative Romberg. No gait disturbance.  Coordination intact including heel to shin and finger to nose.  Cranial nerves III-XII grossly intact.  No facial droop.   Skin:  Skin is warm and dry. She is not diaphoretic.  Psychiatric: She has a normal mood and affect. Her behavior is normal.  Nursing note and vitals reviewed.   ED Course/Procedures   Clinical Course as of Feb 17 2208  Tue Feb 16, 2018  2048 Patient reevaluated.  She is fluidly conversational.  Continues to be alert and oriented.  No noted neurologic deficits.   [SJ]    Clinical Course User Index [SJ] Lorayne Bender, PA-C    Procedures   Results for orders placed or performed during the hospital encounter of 26/94/85  Basic metabolic panel  Result Value Ref Range   Sodium 138 135 - 145 mmol/L   Potassium 4.1 3.5 - 5.1 mmol/L   Chloride 109 101 - 111 mmol/L   CO2 19 (L) 22 - 32 mmol/L   Glucose, Bld 94 65 - 99 mg/dL   BUN 24 (H) 6 - 20 mg/dL   Creatinine, Ser 0.94 0.44 - 1.00 mg/dL   Calcium 9.1 8.9 - 10.3 mg/dL   GFR calc non Af Amer >60 >60 mL/min   GFR calc Af Amer >60 >60 mL/min   Anion gap 10 5 - 15  CBC  Result Value Ref Range   WBC 5.2 4.0 - 10.5 K/uL   RBC 4.62 3.87 - 5.11 MIL/uL   Hemoglobin 11.9 (L) 12.0 - 15.0 g/dL   HCT 37.3 36.0 - 46.0 %   MCV 80.7 78.0 - 100.0 fL   MCH 25.8 (L) 26.0 - 34.0 pg  MCHC 31.9 30.0 - 36.0 g/dL   RDW 14.1 11.5 - 15.5 %   Platelets 262 150 - 400 K/uL  Urinalysis, Routine w reflex microscopic  Result Value Ref Range   Color, Urine YELLOW YELLOW   APPearance CLEAR CLEAR   Specific Gravity, Urine 1.024 1.005 - 1.030   pH 5.0 5.0 - 8.0   Glucose, UA NEGATIVE NEGATIVE mg/dL   Hgb urine dipstick NEGATIVE NEGATIVE   Bilirubin Urine NEGATIVE NEGATIVE   Ketones, ur 20 (A) NEGATIVE mg/dL   Protein, ur NEGATIVE NEGATIVE mg/dL   Nitrite NEGATIVE NEGATIVE   Leukocytes, UA NEGATIVE NEGATIVE  Ethanol  Result Value Ref Range   Alcohol, Ethyl (B) <10 <10 mg/dL  Urine rapid drug screen (hosp performed)  Result Value Ref Range   Opiates NONE DETECTED NONE DETECTED   Cocaine NONE DETECTED NONE DETECTED   Benzodiazepines NONE DETECTED NONE  DETECTED   Amphetamines NONE DETECTED NONE DETECTED   Tetrahydrocannabinol NONE DETECTED NONE DETECTED   Barbiturates NONE DETECTED NONE DETECTED  CBG monitoring, ED  Result Value Ref Range   Glucose-Capillary 75 65 - 99 mg/dL   Comment 1 Notify RN    Comment 2 Document in Chart    Mr Brain Wo Contrast  Result Date: 02/16/2018 CLINICAL DATA:  Initial evaluation for acute altered mental status. EXAM: MRI HEAD WITHOUT CONTRAST TECHNIQUE: Multiplanar, multiecho pulse sequences of the brain and surrounding structures were obtained without intravenous contrast. COMPARISON:  [facility] CT from 03/02/2017. FINDINGS: Brain: Examination is markedly limited as the patient was unable to tolerate the full length of the exam. Axial DWI sequence only was performed. Diffusion-weighted imaging demonstrates no evidence for acute or subacute ischemia. [place] matter differentiation grossly maintained. No obvious evidence for remote infarction. No appreciable mass lesion. No midline shift or mass effect. No hydrocephalus. No appreciable extra-axial fluid collection. Vascular: Not assessed on this limited exam. Skull and upper cervical spine: Poorly assessed on this limited exam. Sinuses/Orbits: Poorly assessed on this limited exam. Other: None. IMPRESSION: 1. Limited study as the patient was unable to tolerate the full length of the exam. Diffusion-weighted imaging multi was performed. 2. No acute intracranial infarct. No other obvious acute intracranial abnormality. Electronically Signed   By: Jeannine Boga M.D.   On: 02/16/2018 19:55    MDM   Patient presents with generalized weakness.  Initially, she was noncompliant with the exam.  Previous notes seem to indicate patient has presented in this way before.  During her ED course she became fully conversational and compliant with full examination.  No acute abnormality on the MRI, though the study was limited.  Laboratory abnormalities favor dehydration.  PCP  follow-up. The patient was given instructions for home care as well as return precautions. Patient voices understanding of these instructions, accepts the plan, and is comfortable with discharge.  Vitals:   02/16/18 1256 02/16/18 1300 02/16/18 1415 02/16/18 1515  BP: 124/80 118/82 107/76 102/75  Pulse: 60 61 (!) 47 (!) 58  Resp: 15 16 17 13   Temp: 98.2 F (36.8 C)     TempSrc: Oral     SpO2: 98% 99% 97% 100%  Weight:      Height:          Layla Maw 02/16/18 2210    Julianne Rice, MD 02/18/18 1009    Maejor Erven, [place], PA-C 02/21/18 0408    Julianne Rice, MD 02/28/18 1601

## 2018-02-16 NOTE — ED Provider Notes (Cosign Needed)
[place] EMERGENCY DEPARTMENT Provider Note   CSN: [id number] Arrival date & time: 02/16/18  1243     History   Chief Complaint Chief Complaint  Patient presents with  . Weakness    HPI [name] is a 52 y.o. female.  HPI Patient presents to the emergency department with complaints of weakness when she called 911.  The patient will not answer any of my questions.  The patient will follow commands.  I did have her squeeze my fingers and she was able to do so.  Patient was recently seen for multiple complaints with no specific dominant complaint. Past Medical History:  Diagnosis Date  . Anxiety   . Diabetes mellitus without complication ([place])   . Hypertension   . Major depressive disorder, recurrent, severe with psychotic features [facility])     Patient Active Problem List   Diagnosis Date Noted  . Delusional disorder, with bizarre content, multiple episodes currently in acute episode ([place]) 01/07/2018  . Anxiety 03/29/2016  . MDD (major depressive disorder), recurrent severe, without psychosis ([place]) 11/18/2015  . Homelessness 11/18/2015  . Tobacco use disorder 07/05/2015  . Malnutrition of moderate degree ([place]) 05/31/2015  . Hypokalemia 05/30/2015  . Obstipation 05/30/2015  . Prolonged QT interval 05/30/2015  . Hypertension 05/30/2015  . 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 Suicidal ideations 03/12/2015  . Diabetes ([place]) 09/06/2012  . Paranoia ([place]) 04/16/2012    No past surgical history on file.   OB History   None      Home Medications    Prior to Admission medications   Medication Sig Start Date End Date Taking? Authorizing Provider  hydrOXYzine (ATARAX/VISTARIL) 25 MG tablet Take 1 tablet (25 mg total) by mouth every 6 (six) hours as needed for anxiety. 01/14/18   Lindell Spar I, NP  OLANZapine (ZYPREXA) 10 MG tablet Take 1 tablet (10 mg total) by mouth 2 (two) times daily. For mood control  01/14/18   Encarnacion Slates, NP    Family History Family History  Problem Relation Age of Onset  . Hypertension Mother   . Diabetes Mother   . Aneurysm Mother   . Hypertension Father   . Diabetes Father   . Hypertension Sister   . Diabetes Sister   . Hypertension Other   . Diabetes Other     Social History Social History   Tobacco Use  . Smoking status: Never Smoker  . Smokeless tobacco: Never Used  Substance Use Topics  . Alcohol use: No  . Drug use: No     Allergies   Patient has no known allergies.   Review of Systems Review of Systems Level 5 caveat applies due to uncooperativeness  Physical Exam Updated Vital Signs BP 107/76   Pulse (!) 47   Temp 98.2 F (36.8 C) (Oral)   Resp 17   Ht 5\' 5"  (1.651 m)   Wt 111.1 kg (245 lb)   LMP 10/29/2011 Comment: NEG U PREG 02/24/17  SpO2 97%   BMI 40.77 kg/m   Physical Exam  Constitutional: She appears well-developed and well-nourished. No distress.  HENT:  Head: Normocephalic and atraumatic.  Mouth/Throat: Oropharynx is clear and moist.  Eyes: Pupils are equal, round, and reactive to light.  Neck: Normal range of motion. Neck supple.  Cardiovascular: Normal rate, regular rhythm and normal heart sounds. Exam reveals no gallop and no friction rub.  No murmur heard. Pulmonary/Chest: Effort normal and breath sounds normal. No  respiratory distress. She has no wheezes.  Neurological: She is alert. She exhibits normal muscle tone. Coordination normal.  Patient will squeeze my fingers. will arouse to my voice without significant difficulty.   Skin: Skin is warm and dry. Capillary refill takes less than 2 seconds. No rash noted. No erythema.  Psychiatric: She has a normal mood and affect. Her behavior is normal.  Nursing note and vitals reviewed.    ED Treatments / Results  Labs (all labs ordered are listed, but only abnormal results are displayed) Labs Reviewed  BASIC METABOLIC PANEL - Abnormal; Notable for the  following components:      Result Value   CO2 19 (*)    BUN 24 (*)    All other components within normal limits  CBC - Abnormal; Notable for the following components:   Hemoglobin 11.9 (*)    MCH 25.8 (*)    All other components within normal limits  URINALYSIS, ROUTINE W REFLEX MICROSCOPIC  CBG MONITORING, ED    EKG EKG Interpretation  Date/Time:  Tuesday February 16 2018 12:56:15 EDT Ventricular Rate:  61 PR Interval:    QRS Duration: 89 QT Interval:  419 QTC Calculation: 422 R Axis:   26 Text Interpretation:  Sinus rhythm Baseline wander in lead(s) V6 No significant change since last tracing Confirmed by Blanchie Dessert [phone]) on 02/16/2018 1:18:06 PM   Radiology No results found.  Procedures Procedures (including critical care time)  Medications Ordered in ED Medications  sodium chloride 0.9 % bolus 1,000 mL (1,000 mLs Intravenous New Bag/Given 02/16/18 1517)     Initial Impression / Assessment and Plan / ED Course  I have reviewed the triage vital signs and the nursing notes.  Pertinent labs & imaging results that were available during my care of the patient were reviewed by me and considered in my medical decision making (see chart for details).    The patient will need further workup and evaluation with head CT.  The patient's laboratory testing so far has been unremarkable.  The patient may have some underlying issue medically but at this point I am not seeing any significant findings.  The patient will have CT scan of her head.  She is receiving IV fluids as well.  Shawn Joy PA-C will follow up with this patient.   Final Clinical Impressions(s) / ED Diagnoses   Final diagnoses:  None    ED Discharge Orders    None       Dalia Heading, [place] 02/16/18 1645

## 2018-02-16 NOTE — ED Notes (Signed)
Urine culture sent to lab with urinalysis.  

## 2018-02-16 NOTE — Discharge Instructions (Signed)
There is evidence of dehydration noted on lab work.  Please be sure to increase the amount of water you drink.,  Having a goal of eight 8 ounce glasses of water a day. Follow-up with a primary care provider.  Return to the ED as needed.

## 2018-03-03 ENCOUNTER — Encounter: Payer: Self-pay | Admitting: Neurology

## 2018-03-03 ENCOUNTER — Other Ambulatory Visit: Payer: Self-pay

## 2018-03-03 ENCOUNTER — Encounter (HOSPITAL_COMMUNITY): Payer: Self-pay | Admitting: Emergency Medicine

## 2018-03-03 ENCOUNTER — Emergency Department (HOSPITAL_COMMUNITY)
Admission: EM | Admit: 2018-03-03 | Discharge: 2018-03-03 | Discharge status: Against Medical Advice | Payer: Self-pay | Attending: Emergency Medicine | Admitting: Emergency Medicine

## 2018-03-03 DIAGNOSIS — Z5321 Procedure and treatment not carried out due to patient leaving prior to being seen by health care provider: Secondary | ICD-10-CM | POA: Insufficient documentation

## 2018-03-03 NOTE — ED Notes (Signed)
Bed: WA33 Expected date:  Expected time:  Means of arrival:  Comments: 

## 2018-03-03 NOTE — ED Notes (Signed)
Patient refused lab draw.

## 2018-03-03 NOTE — ED Notes (Signed)
Pt refusing to be wanded by security to go to TCU; pt offered labs drawn. Pt refuses labs and care at this time. Dillon Bjork charge RN speaking with pt in triage.

## 2018-03-03 NOTE — ED Triage Notes (Signed)
Pt arrived via EMS from home pt is c/o of "bleeding in her scalp" d/t being stressed. Pt denies other symptoms or pain.     BP 112/82 HR 66 RR 16 CBG 131

## 2018-03-03 NOTE — ED Notes (Signed)
Bed: [facility] Expected date:  Expected time:  Means of arrival:  Comments: 

## 2018-03-03 NOTE — ED Notes (Signed)
Pt continues to refuse care. Pt leaving.

## 2018-03-24 ENCOUNTER — Emergency Department (HOSPITAL_COMMUNITY): Admission: EM | Admit: 2018-03-24 | Discharge: 2018-03-24 | Discharge status: Against Medical Advice | Payer: Self-pay
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
## 2018-03-25 ENCOUNTER — Other Ambulatory Visit: Payer: Self-pay

## 2018-03-25 ENCOUNTER — Encounter (HOSPITAL_COMMUNITY): Payer: Self-pay | Admitting: Emergency Medicine

## 2018-03-25 ENCOUNTER — Emergency Department (HOSPITAL_COMMUNITY)
Admission: EM | Admit: 2018-03-25 | Discharge: 2018-03-26 | Disposition: A | Discharge status: Other Acute Inpatient Hospital | Payer: Self-pay | Attending: Emergency Medicine | Admitting: Emergency Medicine

## 2018-03-25 DIAGNOSIS — F22 Delusional disorders: Secondary | ICD-10-CM | POA: Insufficient documentation

## 2018-03-25 DIAGNOSIS — Z79899 Other long term (current) drug therapy: Secondary | ICD-10-CM | POA: Insufficient documentation

## 2018-03-25 DIAGNOSIS — E119 Type 2 diabetes mellitus without complications: Secondary | ICD-10-CM | POA: Insufficient documentation

## 2018-03-25 DIAGNOSIS — I10 Essential (primary) hypertension: Secondary | ICD-10-CM | POA: Insufficient documentation

## 2018-03-25 LAB — CBC
HCT: 34.3 % — ABNORMAL LOW (ref 36.0–46.0)
HEMOGLOBIN: 10.8 g/dL — AB (ref 12.0–15.0)
MCH: 25.7 pg — AB (ref 26.0–34.0)
MCHC: 31.5 g/dL (ref 30.0–36.0)
MCV: 81.7 fL (ref 78.0–100.0)
PLATELETS: 303 10*3/uL (ref 150–400)
RBC: 4.2 MIL/uL (ref 3.87–5.11)
RDW: 14.4 % (ref 11.5–15.5)
WBC: 7 10*3/uL (ref 4.0–10.5)

## 2018-03-25 LAB — COMPREHENSIVE METABOLIC PANEL
ALK PHOS: 138 U/L — AB (ref 38–126)
ALT: 24 U/L (ref 14–54)
ANION GAP: 9 (ref 5–15)
AST: 23 U/L (ref 15–41)
Albumin: 4.1 g/dL (ref 3.5–5.0)
BUN: 23 mg/dL — ABNORMAL HIGH (ref 6–20)
CALCIUM: 8.7 mg/dL — AB (ref 8.9–10.3)
CO2: 23 mmol/L (ref 22–32)
CREATININE: 0.98 mg/dL (ref 0.44–1.00)
Chloride: 107 mmol/L (ref 101–111)
GFR calc Af Amer: >60 mL/min (ref >60)
Glucose, Bld: 133 mg/dL — ABNORMAL HIGH (ref 65–99)
Potassium: 3.8 mmol/L (ref 3.5–5.1)
SODIUM: 139 mmol/L (ref 135–145)
Total Bilirubin: 0.2 mg/dL — ABNORMAL LOW (ref 0.3–1.2)
Total Protein: 7.2 g/dL (ref 6.5–8.1)

## 2018-03-25 LAB — I-STAT BETA HCG BLOOD, ED (MC, WL, AP ONLY): I-stat hCG, quantitative: <5 m[IU]/mL (ref <5)

## 2018-03-25 LAB — ACETAMINOPHEN LEVEL

## 2018-03-25 LAB — RAPID URINE DRUG SCREEN, HOSP PERFORMED
Amphetamines: NOT DETECTED
Barbiturates: NOT DETECTED
Benzodiazepines: NOT DETECTED
COCAINE: NOT DETECTED
OPIATES: NOT DETECTED
Tetrahydrocannabinol: NOT DETECTED

## 2018-03-25 LAB — ETHANOL: Alcohol, Ethyl (B): <10 mg/dL (ref <10)

## 2018-03-25 LAB — SALICYLATE LEVEL

## 2018-03-25 MED ORDER — OLANZAPINE 10 MG PO TABS
10.0000 mg | ORAL_TABLET | Freq: Two times a day (BID) | ORAL | Status: DC
  Administered 2018-03-25 – 2018-03-26 (×3): 10 mg via ORAL
  Filled 2018-03-25 (×3): qty 1

## 2018-03-25 MED ORDER — HYDROXYZINE HCL 25 MG PO TABS: 25.0000 mg | ORAL_TABLET | Freq: Four times a day (QID) | ORAL | Status: DC | PRN
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
## 2018-03-25 NOTE — ED Notes (Signed)
Report given to [facility] RN 
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
## 2018-03-26 ENCOUNTER — Encounter (HOSPITAL_COMMUNITY): Payer: Self-pay

## 2018-03-26 ENCOUNTER — Inpatient Hospital Stay (HOSPITAL_COMMUNITY)
Admission: AD | Admit: 2018-03-26 | Discharge: 2018-04-05 | DRG: 885 | Disposition: A | Discharge status: Home / Self Care | Payer: Federal, State, Local not specified - Other | Source: Intra-hospital | Attending: Psychiatry | Admitting: Psychiatry

## 2018-03-26 DIAGNOSIS — Z564 Discord with boss and workmates: Secondary | ICD-10-CM | POA: Diagnosis not present

## 2018-03-26 DIAGNOSIS — F419 Anxiety disorder, unspecified: Secondary | ICD-10-CM | POA: Diagnosis present

## 2018-03-26 DIAGNOSIS — F22 Delusional disorders: Secondary | ICD-10-CM | POA: Diagnosis present

## 2018-03-26 DIAGNOSIS — Z833 Family history of diabetes mellitus: Secondary | ICD-10-CM

## 2018-03-26 DIAGNOSIS — Z79899 Other long term (current) drug therapy: Secondary | ICD-10-CM | POA: Diagnosis not present

## 2018-03-26 DIAGNOSIS — G259 Extrapyramidal and movement disorder, unspecified: Secondary | ICD-10-CM | POA: Diagnosis not present

## 2018-03-26 DIAGNOSIS — I10 Essential (primary) hypertension: Secondary | ICD-10-CM | POA: Diagnosis present

## 2018-03-26 DIAGNOSIS — R450 Nervousness: Secondary | ICD-10-CM | POA: Diagnosis not present

## 2018-03-26 DIAGNOSIS — E119 Type 2 diabetes mellitus without complications: Secondary | ICD-10-CM | POA: Diagnosis present

## 2018-03-26 DIAGNOSIS — Z9114 Patient's other noncompliance with medication regimen: Secondary | ICD-10-CM

## 2018-03-26 DIAGNOSIS — G4700 Insomnia, unspecified: Secondary | ICD-10-CM | POA: Diagnosis present

## 2018-03-26 DIAGNOSIS — F209 Schizophrenia, unspecified: Secondary | ICD-10-CM | POA: Diagnosis present

## 2018-03-26 MED ORDER — MAGNESIUM HYDROXIDE 400 MG/5ML PO SUSP: 30.0000 mL | Freq: Every day | ORAL | Status: DC | PRN

## 2018-03-26 MED ORDER — HYDROXYZINE HCL 25 MG PO TABS
25.0000 mg | ORAL_TABLET | Freq: Four times a day (QID) | ORAL | Status: DC | PRN
  Administered 2018-03-27 – 2018-03-31 (×5): 25 mg via ORAL
  Filled 2018-03-26 (×5): qty 1
  Filled 2018-03-26: qty 10

## 2018-03-26 MED ORDER — ACETAMINOPHEN 325 MG PO TABS: 650.0000 mg | ORAL_TABLET | Freq: Four times a day (QID) | ORAL | Status: DC | PRN

## 2018-03-26 MED ORDER — ALUM & MAG HYDROXIDE-SIMETH 200-200-20 MG/5ML PO SUSP: 30.0000 mL | ORAL | Status: DC | PRN

## 2018-03-26 MED ORDER — OLANZAPINE 10 MG PO TABS
10.0000 mg | ORAL_TABLET | Freq: Two times a day (BID) | ORAL | Status: DC
  Administered 2018-03-27 – 2018-04-05 (×18): 10 mg via ORAL
  Filled 2018-03-26 (×4): qty 1
  Filled 2018-03-26: qty 14
  Filled 2018-03-26 (×4): qty 1
  Filled 2018-03-26: qty 14
  Filled 2018-03-26 (×17): qty 1
  Filled 2018-03-26: qty 14
  Filled 2018-03-26: qty 1
  Filled 2018-03-26: qty 14
  Filled 2018-03-26: qty 1
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
## 2018-03-26 NOTE — BH Assessment (Signed)
[facility] Assessment Progress Note  Per Buford Dresser, DO, this pt requires psychiatric hospitalization at this time.  Letitia Libra, RN, [facility] has assigned pt to [facility] Rm [id number].  Pt has signed Voluntary Admission and Consent for Treatment, as well as Consent to Release Information to [place], and signed forms have been faxed to [facility].  Pt's nurse, Diane, has been notified, and agrees to send original paperwork along with pt via [facility] [place], and to call report to [phone].  [name], [place] Coordinator [phone]

## 2018-03-26 NOTE — Consult Note (Addendum)
[facility] Face-to-Face Psychiatry Consult   Reason for Consult:  Paranoia  Referring Physician:  EDP Patient Identification: [name] MRN:  [id number] Principal Diagnosis: Delusional disorder, with bizarre content, multiple episodes currently in acute episode (HCC) Diagnosis:   Patient Active Problem List   Diagnosis Date Noted  . Delusional disorder, with bizarre content, multiple episodes currently in acute episode (HCC) [F22] 01/07/2018    Priority: High  . Anxiety [F41.9] 03/29/2016    Priority: High  . Adjustment disorder with mixed disturbance of emotions and conduct [F43.25] 04/15/2015    Priority: High  . Suicidal ideations [R45.851] 03/12/2015    Priority: High  . MDD (major depressive disorder), recurrent severe, without psychosis (HCC) [F33.2] 11/18/2015  . Homelessness [Z59.0] 11/18/2015  . Tobacco use disorder [F17.200] 07/05/2015  . Malnutrition of moderate degree (HCC) [E44.0] 05/31/2015  . Hypokalemia [E87.6] 05/30/2015  . Obstipation [K59.00] 05/30/2015  . Prolonged QT interval [R94.31] 05/30/2015  . Hypertension [I10] 05/30/2015  . Diabetes (HCC) [E11.9] 09/06/2012  . Paranoia (HCC) [F22] 04/16/2012    Total Time spent with patient: 45 minutes  Subjective:   [name] is a 52 y.o. female patient admitted with psychosis.  HPI:  52 yo female who came to the ED under IVC for paranoia and delusional about people breaking into her home.  She was at [facility] in March but unfortunately stopped taking her medications.  No suicidal/homicidal ideations, hallucinations, or substance abuse noted.  However, she continues to believe she is getting hate mail and needs to "file an appeal."  Feels she is being stalked and "all these people are fighting her."  She is currently taking medications but not at her baseline.  Past Psychiatric History: depression, psychosis  Risk to Self: Is patient at risk for suicide?: No Risk to Others:  yes Prior Inpatient Therapy:  yes Prior  Outpatient Therapy:  yes  Past Medical History:  Past Medical History:  Diagnosis Date  . Anxiety   . Diabetes mellitus without complication (HCC)   . Hypertension   . Major depressive disorder, recurrent, severe with psychotic features (HCC)    History reviewed. No pertinent surgical history. Family History:  Family History  Problem Relation Age of Onset  . Hypertension Mother   . Diabetes Mother   . Aneurysm Mother   . Hypertension Father   . Diabetes Father   . Hypertension Sister   . Diabetes Sister   . Hypertension Other   . Diabetes Other    Family Psychiatric  History: none Social History:  Social History   Substance and Sexual Activity  Alcohol Use No     Social History   Substance and Sexual Activity  Drug Use No    Social History   Socioeconomic History  . Marital status: Single    Spouse name: Not on file  . Number of children: Not on file  . Years of education: Not on file  . Highest education level: Not on file  Occupational History  . Not on file  Social Needs  . Financial resource strain: Not on file  . Food insecurity:    Worry: Not on file    Inability: Not on file  . Transportation needs:    Medical: Not on file    Non-medical: Not on file  Tobacco Use  . Smoking status: Never Smoker  . Smokeless tobacco: Never Used  Substance and Sexual Activity  . Alcohol use: No  . Drug use: No  . Sexual activity: Never  Lifestyle  .   Physical activity:    Days per week: Not on file    Minutes per session: Not on file  . Stress: Not on file  Relationships  . Social connections:    Talks on phone: Not on file    Gets together: Not on file    Attends religious service: Not on file    Active member of club or organization: Not on file    Attends meetings of clubs or organizations: Not on file    Relationship status: Not on file  Other Topics Concern  . Not on file  Social History Narrative  . Not on file   Additional Social History: N/A     Allergies:  No Known Allergies  Labs:  Results for orders placed or performed during the hospital encounter of 03/25/18 (from the past 48 hour(s))  Comprehensive metabolic panel     Status: Abnormal   Collection Time: 03/25/18  2:46 AM  Result Value Ref Range   Sodium 139 135 - 145 mmol/L   Potassium 3.8 3.5 - 5.1 mmol/L   Chloride 107 101 - 111 mmol/L   CO2 23 22 - 32 mmol/L   Glucose, Bld 133 (H) 65 - 99 mg/dL   BUN 23 (H) 6 - 20 mg/dL   Creatinine, Ser 0.98 0.44 - 1.00 mg/dL   Calcium 8.7 (L) 8.9 - 10.3 mg/dL   Total Protein 7.2 6.5 - 8.1 g/dL   Albumin 4.1 3.5 - 5.0 g/dL   AST 23 15 - 41 U/L   ALT 24 14 - 54 U/L   Alkaline Phosphatase 138 (H) 38 - 126 U/L   Total Bilirubin 0.2 (L) 0.3 - 1.2 mg/dL   GFR calc non Af Amer >60 >60 mL/min   GFR calc Af Amer >60 >60 mL/min    Comment: (NOTE) The eGFR has been calculated using the CKD EPI equation. This calculation has not been validated in all clinical situations. eGFR's persistently <60 mL/min signify possible Chronic Kidney Disease.    Anion gap 9 5 - 15    Comment: Performed at [facility], [address] [place], [place] [address]  Ethanol     Status: None   Collection Time: 03/25/18  2:46 AM  Result Value Ref Range   Alcohol, Ethyl (B) <10 <10 mg/dL    Comment: (NOTE) Lowest detectable limit for serum alcohol is 10 mg/dL. For medical purposes only. Performed at [facility], [address] [place], [place] [address]   Salicylate level     Status: None   Collection Time: 03/25/18  2:46 AM  Result Value Ref Range   Salicylate Lvl <7.0 2.8 - 30.0 mg/dL    Comment: Performed at [facility], [address] [place], [place] [address]  Acetaminophen level     Status: Abnormal   Collection Time: 03/25/18  2:46 AM  Result Value Ref Range   Acetaminophen (Tylenol), Serum <10 (L) 10 - 30 ug/mL    Comment: (NOTE) Therapeutic concentrations vary significantly.  A range of 10-30 ug/mL  may be an effective concentration for many patients. However, some  are best treated at concentrations outside of this range. Acetaminophen concentrations >150 ug/mL at 4 hours after ingestion  and >50 ug/mL at 12 hours after ingestion are often associated with  toxic reactions. Performed at [facility], [address] [place], [place] [address]   cbc     Status: Abnormal   Collection Time: 03/25/18  2:46 AM    Result Value Ref Range   WBC 7.0 4.0 - 10.5 K/uL   RBC 4.20 3.87 - 5.11 MIL/uL   Hemoglobin 10.8 (L) 12.0 - 15.0 g/dL   HCT 34.3 (L) 36.0 - 46.0 %   MCV 81.7 78.0 - 100.0 fL   MCH 25.7 (L) 26.0 - 34.0 pg   MCHC 31.5 30.0 - 36.0 g/dL   RDW 14.4 11.5 - 15.5 %   Platelets 303 150 - 400 K/uL    Comment: Performed at [facility], [place] [address], [place], [place] [address]  Rapid urine drug screen (hospital performed)     Status: None   Collection Time: 03/25/18  2:46 AM  Result Value Ref Range   Opiates NONE DETECTED NONE DETECTED   Cocaine NONE DETECTED NONE DETECTED   Benzodiazepines NONE DETECTED NONE DETECTED   Amphetamines NONE DETECTED NONE DETECTED   Tetrahydrocannabinol NONE DETECTED NONE DETECTED   Barbiturates NONE DETECTED NONE DETECTED    Comment: (NOTE) DRUG SCREEN FOR MEDICAL PURPOSES ONLY.  IF CONFIRMATION IS NEEDED FOR ANY PURPOSE, NOTIFY LAB WITHIN 5 DAYS. LOWEST DETECTABLE LIMITS FOR URINE DRUG SCREEN Drug Class                     Cutoff (ng/mL) Amphetamine and metabolites    1000 Barbiturate and metabolites    200 Benzodiazepine                 704 Tricyclics and metabolites     300 Opiates and metabolites        300 Cocaine and metabolites        300 THC                            50 Performed at [facility], [place] [address], [place], [place] [address]   I-Stat beta hCG blood, ED     Status: None   Collection Time: 03/25/18  2:59 AM  Result Value Ref Range    I-stat hCG, quantitative <5.0 <5 mIU/mL   Comment 3            Comment:   GEST. AGE      CONC.  (mIU/mL)   <=1 WEEK        5 - 50     2 WEEKS       50 - 500     3 WEEKS       100 - 10,000     4 WEEKS     1,000 - 30,000        FEMALE AND NON-PREGNANT FEMALE:     LESS THAN 5 mIU/mL     Current Facility-Administered Medications  Medication Dose Route Frequency Provider Last Rate Last Dose  . hydrOXYzine (ATARAX/VISTARIL) tablet 25 mg  25 mg Oral Q6H PRN Patrecia Pour, NP      . OLANZapine (ZYPREXA) tablet 10 mg  10 mg Oral BID Patrecia Pour, NP   10 mg at 03/26/18 6945   Current Outpatient Medications  Medication Sig Dispense Refill  . hydrOXYzine (ATARAX/VISTARIL) 25 MG tablet Take 1 tablet (25 mg total) by mouth every 6 (six) hours as needed for anxiety. (Patient not taking: Reported on 03/25/2018) 60 tablet 0  . OLANZapine (ZYPREXA) 10 MG tablet Take 1 tablet (10 mg total) by mouth 2 (two) times daily. For mood control (Patient not taking: Reported on 03/25/2018) 60 tablet 0    Musculoskeletal:  Strength & Muscle Tone: within normal limits Gait & Station: normal Patient leans: N/A  Psychiatric Specialty Exam: Physical Exam  Nursing note and vitals reviewed. Constitutional: She is oriented to person, place, and time. She appears well-developed and well-nourished.  HENT:  Head: Normocephalic and atraumatic.  Neck: Normal range of motion.  Respiratory: Effort normal.  Musculoskeletal: Normal range of motion.  Neurological: She is alert and oriented to person, place, and time.  Psychiatric: Her speech is normal. Her mood appears anxious. Her affect is blunt and labile. Thought content is paranoid and delusional. Cognition and memory are impaired. She expresses inappropriate judgment. She is inattentive.    Review of Systems  Psychiatric/Behavioral: The patient is nervous/anxious.   All other systems reviewed and are negative.   Blood pressure (!) 102/54, pulse 60, temperature  97.8 F (36.6 C), resp. rate 20, last menstrual period 10/29/2011, SpO2 98 %.There is no height or weight on file to calculate BMI.  General Appearance: Casual  Eye Contact:  Minimal  Speech:  Normal Rate  Volume:  Normal  Mood:  Anxious and Irritable  Affect:  Labile  Thought Process:  Coherent and Descriptions of Associations: Intact  Orientation:  Full (Time, Place, and Person)  Thought Content:  Delusions and Paranoid Ideation  Suicidal Thoughts:  No  Homicidal Thoughts:  No  Memory:  Immediate;   Fair Recent;   Fair Remote;   Fair  Judgement:  Impaired  Insight:  Lacking  Psychomotor Activity:  Decreased  Concentration:  Concentration: Fair and Attention Span: Fair  Recall:  Fair  Fund of Knowledge:  Fair  Language:  Good  Akathisia:  No  Handed:  Right  AIMS (if indicated):   N/A  Assets:  Housing Leisure Time Physical Health Resilience  ADL's:  Intact  Cognition:  Impaired,  Mild  Sleep:   N/A     Treatment Plan Summary: Daily contact with patient to assess and evaluate symptoms and progress in treatment, Medication management and Plan Delusional disorder, with bizarre content, multiple episodes currently in acute episode (HCC)  -Started Zyprexa 10 mg BID for psychosis -Started Vistaril 25 mg every six hours PRN anxiety -Crisis stabilization -Individual counseling  Disposition: Recommend psychiatric Inpatient admission when medically cleared.  LORD, JAMISON, NP 03/26/2018 3:46 PM   Patient seen face-to-face for psychiatric evaluation, chart reviewed and case discussed with the physician extender and developed treatment plan. Reviewed the information documented and agree with the treatment plan.   , DO 03/26/18 6:48 PM    

## 2018-03-26 NOTE — ED Notes (Signed)
Report called to [place] at [facility].
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
## 2018-03-26 NOTE — Progress Notes (Signed)
Pt presents on the unit very demanding and appears to be entitled. Pt appears to possibly be orthostatic. Pt stated she sometimes feels dizzy when she has stood up in the past. Pt stood up earlier this evening and started to become dizzy, pt was escorted to her room and pt was given Gatorade and pt was educated on fall safety again.

## 2018-03-26 NOTE — Tx Team (Signed)
Initial Treatment Plan 03/26/2018 8:08 PM [name] [facility]:[id number]    PATIENT STRESSORS: Financial difficulties Legal issue Medication change or noncompliance   PATIENT STRENGTHS: Capable of independent living General fund of knowledge   PATIENT IDENTIFIED PROBLEMS:  delusions  paranoia  "how to be safe  depressed               DISCHARGE CRITERIA:  Adequate post-discharge living arrangements Improved stabilization in mood, thinking, and/or behavior Verbal commitment to aftercare and medication compliance  PRELIMINARY DISCHARGE PLAN: Attend aftercare/continuing care group Outpatient therapy  PATIENT/FAMILY INVOLVEMENT: This treatment plan has been presented to and reviewed with the patient, [name].  The patient and family have been given the opportunity to ask questions and make suggestions.  Providence Crosby, RN 03/26/2018, 8:08 PM

## 2018-03-26 NOTE — ED Notes (Signed)
Transported to [facility] by [employer] transportation.  All belongings returned to pt. Pt paranoid and refused to sign for belongings. Pt was cooperative.

## 2018-03-27 DIAGNOSIS — F22 Delusional disorders: Principal | ICD-10-CM

## 2018-03-27 MED ORDER — TRAZODONE HCL 100 MG PO TABS
100.0000 mg | ORAL_TABLET | Freq: Every evening | ORAL | Status: DC | PRN
  Administered 2018-03-27 – 2018-04-04 (×6): 100 mg via ORAL
  Filled 2018-03-27 (×3): qty 1
  Filled 2018-03-27: qty 14
  Filled 2018-03-27 (×19): qty 1
  Filled 2018-03-27: qty 14
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
## 2018-03-27 NOTE — BHH Suicide Risk Assessment (Signed)
[facility] INPATIENT:  Family/Significant Other Suicide Prevention Education  Suicide Prevention Education:  Education Completed; No one has been identified by the patient as the family member/significant other with whom the patient will be residing, and identified as the person(s) who will aid the patient in the event of a mental health crisis (suicidal ideations/suicide attempt).   Patient had no suicidal ideation at the time of admission or during hospital stay.  She refused collateral contact with family.  Berlin Hun Grossman-Orr 03/27/2018, 4:43 PM

## 2018-03-27 NOTE — Plan of Care (Signed)
D: Pt denies SI/HI/AVH. Pt was visible in the dayroom some of the evening. Pt less demanding this evening. Pt stated she felt worse compared to when she came in, pt continued to express wanting to leave.   A: Pt was offered support and encouragement. Pt was given scheduled medications. Pt was encourage to attend groups. Q 15 minute checks were done for safety.   R:Pt attends groups and interacts well with peers and staff. Pt is taking medication . Pt receptive to treatment and safety maintained on unit.   Problem: Education: Goal: Emotional status will improve Outcome: Progressing   Problem: Coping: Goal: Ability to demonstrate self-control will improve Outcome: Progressing   Problem: Coping: Goal: Coping ability will improve Outcome: Progressing

## 2018-03-27 NOTE — BHH Group Notes (Signed)
[facility] Group Notes: (Clinical Social Work)   03/27/2018      Type of Therapy:  Group Therapy   Participation Level:  Did Not Attend despite MHT prompting   Selmer Dominion, LCSW 03/27/2018, 12:06 PM

## 2018-03-27 NOTE — BHH Group Notes (Signed)
[facility] Group Notes:  (Nursing/MHT/Case Management/Adjunct)  Date:  03/27/2018  Time:  10:48 AM  Type of Therapy:  Psychoeducational Skills  Participation Level:  Did Not Attend    Coralyn Mark Aleks Nawrot 03/27/2018, 10:48 AM
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
## 2018-03-27 NOTE — Plan of Care (Signed)
  Problem: Safety: Goal: Periods of time without injury will increase Outcome: Progressing   Problem: Nutritional: Goal: Ability to achieve adequate nutritional intake will improve Outcome: Progressing D: Pt denies SI, HI, AVH and pain when assessed. Presents with blunted affect and irritable mood. Remains paranoid, believes another peer on unit is "one of those I file the law suit against at work and she still follows me" while pointing to peers during group, "you did it and still following me". Came into groups this AM and left abruptly "I'm not doing this" and went to bed.  A: Introduced self to pt. Emotional support and availability provided to pt.  Continued verbal redirection offered related to pt's intrusive behavior towards peers. Writer encouraged pt to voice concerns, attend to ADLS and comply with current treatment regimen. All medications administered with verbal education and effects monitored. Safety checks maintained at Q 15 minutes intervals without outburst or self harm gestures.   R: Pt took her medications without issues. Denies adverse drug reactions when assessed this shift. Tolerates all PO intake well. Remains safe on and off unit.
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
## 2018-03-28 DIAGNOSIS — F22 Delusional disorders: Secondary | ICD-10-CM | POA: Diagnosis not present

## 2018-03-28 DIAGNOSIS — Z79899 Other long term (current) drug therapy: Secondary | ICD-10-CM

## 2018-03-28 DIAGNOSIS — G4700 Insomnia, unspecified: Secondary | ICD-10-CM

## 2018-03-28 DIAGNOSIS — Z564 Discord with boss and workmates: Secondary | ICD-10-CM

## 2018-03-28 LAB — URINALYSIS, COMPLETE (UACMP) WITH MICROSCOPIC
BILIRUBIN URINE: NEGATIVE
Glucose, UA: NEGATIVE mg/dL
HGB URINE DIPSTICK: NEGATIVE
KETONES UR: NEGATIVE mg/dL
LEUKOCYTES UA: NEGATIVE
NITRITE: NEGATIVE
PH: 6 (ref 5.0–8.0)
PROTEIN: NEGATIVE mg/dL
SPECIFIC GRAVITY, URINE: 1.004 — AB (ref 1.005–1.030)

## 2018-03-28 MED ORDER — BENZTROPINE MESYLATE 0.5 MG PO TABS
0.5000 mg | ORAL_TABLET | Freq: Two times a day (BID) | ORAL | Status: DC
  Administered 2018-03-29 – 2018-04-05 (×15): 0.5 mg via ORAL
  Filled 2018-03-28 (×2): qty 1
  Filled 2018-03-28: qty 14
  Filled 2018-03-28 (×2): qty 1
  Filled 2018-03-28: qty 14
  Filled 2018-03-28 (×4): qty 1
  Filled 2018-03-28: qty 14
  Filled 2018-03-28 (×7): qty 1
  Filled 2018-03-28: qty 14
  Filled 2018-03-28 (×3): qty 1

## 2018-03-28 NOTE — Progress Notes (Signed)
Pt refused labs this morning.  

## 2018-03-28 NOTE — BHH Group Notes (Signed)
[facility] Group Notes: (Clinical Social Work)   03/28/2018      Type of Therapy:  Group Therapy   Participation Level:  Did Not Attend despite MHT prompting   Selmer Dominion, LCSW 03/28/2018, 1:22 PM
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
## 2018-03-28 NOTE — Plan of Care (Signed)
D: Pt denies SI/HI/AVH . Pt keeps to herself, pt continues to be delusional and paranoid at times. Pt stated she gets stomach ache when thinking about going home because someone after her will be coming by her place.   A: Pt was offered support and encouragement. Pt was given scheduled medications. Pt was encourage to attend groups. Q 15 minute checks were done for safety.   R:Pt attends groups and interacts well with peers and staff. Pt is taking medication. Pt receptive to treatment and safety maintained on unit.   Problem: Education: Goal: Emotional status will improve Outcome: Progressing Goal: Mental status will improve Outcome: Progressing   Problem: Education: Goal: Mental status will improve Outcome: Progressing   Problem: Activity: Goal: Sleeping patterns will improve Outcome: Progressing   Problem: Safety: Goal: Periods of time without injury will increase Outcome: Progressing

## 2018-03-28 NOTE — Plan of Care (Signed)
D:Patient has been reclusive to her room today. Patient has been in bed sleeping. Patient did wake up for meals and she was awaken by this writer to attend group but did not attend. Patient is suspicious of another peer on unit. Patient feels she is being watch and followed. Patient also states this was going on prior to coming to hospital. Patient states her coworkers are watching her at work. Patient rated depression-8, hopelessness-0 and anxiety-10. A:Patient provided support. Patient redirected. Patient reassured she was safe on unit and no one here was following her. Patient with Q 15 minute checks in progress and remains safe on unit. Patient administered prescribed medications as ordered.  R: Patient taking medications as ordered. Patient remains safe on unit. Patient did not attend group. Patient interacts with staff but does not interact with peers today.   Problem: Safety: Goal: Periods of time without injury will increase Outcome: Progressing Note:  Patient denies SI and remains safe on unit.

## 2018-03-29 DIAGNOSIS — F419 Anxiety disorder, unspecified: Secondary | ICD-10-CM

## 2018-03-29 DIAGNOSIS — R450 Nervousness: Secondary | ICD-10-CM

## 2018-03-29 NOTE — Progress Notes (Signed)
D:  Patient's self inventory sheet, patient has fair sleep, sleep medication helpful.  Poor appetite, normal energy level, good concentration.  Depression 5, hopeless 1, anxiety 9.  Denied withdrawals.  Denied SI.  Physical problems, lightheaded, dizziness.  Denied physical pain.  Goal is to "be safe".   Plans to "try".  No discharge plans. A:  Medications administered per MD orders.  Emotional support and encouragement given patient. Patient stated she wants to be discharged today.  Patient also stated "How can I be safe when I leave here?"
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
## 2018-03-29 NOTE — Progress Notes (Signed)
Recreation Therapy Notes  Date: 6.3.19 Time: 1000 Location: 500 Hall Dayroom  Group Topic: Coping Skills  Goal Area(s) Addresses:  Patient will be able to identify positive coping skills. Patient will be able to identify benefits of using coping skills post d/c.  Intervention: Worksheet, pencils  Activity: Mind map.  LRT and patients filled in the first 8 boxes of the mind map together (guilt, disappointment, sadness, anger, physical harm, depression, anxiety and financial problems).  Patients were to then come up with 3 coping skills for each problem individually before reconvening as a group.  LRT would then fill in the coping skills on the board.   Education: Radiographer, therapeutic, Dentist.   Education Outcome: Acknowledges understanding/In group clarification offered/Needs additional education.   Clinical Observations/Feedback: Pt did not attend group.     [name], LRT/CTRS         [name], Tamberly Pomplun A 03/29/2018 12:00 PM

## 2018-03-29 NOTE — Plan of Care (Signed)
Nurse discussed depression, anxiety, coping skills with patient.  

## 2018-03-29 NOTE — Plan of Care (Signed)
D: Pt denies SI/HI/AVH. Pt is pleasant and cooperative. Pt continues to be entitled, but is more redirectable this evening.   A: Pt was offered support and encouragement. Pt was given scheduled medications. Pt was encourage to attend groups. Q 15 minute checks were done for safety.   R: safety maintained on unit.   Problem: Education: Goal: Emotional status will improve Outcome: Progressing   Problem: Education: Goal: Mental status will improve Outcome: Progressing   Problem: Activity: Goal: Sleeping patterns will improve Outcome: Progressing   Problem: Safety: Goal: Periods of time without injury will increase Outcome: Progressing

## 2018-03-29 NOTE — Progress Notes (Signed)
Pt came to nursing station requesting something else for sleep. Pt given her 2nd Trazodone. " I t looks like my roommate , her brother lives in my apartment complex" , pt presents very paranoid about her roommate. Pt came to dayroom sitting and watching TV due to being paranoid about her roommate.

## 2018-03-30 LAB — URINE CULTURE: SPECIAL REQUESTS: NORMAL

## 2018-03-30 MED ORDER — LORAZEPAM 1 MG PO TABS
ORAL_TABLET | ORAL | Status: AC
  Filled 2018-03-30: qty 1

## 2018-03-30 MED ORDER — FLUPHENAZINE HCL 5 MG PO TABS
5.0000 mg | ORAL_TABLET | Freq: Two times a day (BID) | ORAL | Status: DC
  Administered 2018-03-30 – 2018-04-01 (×4): 5 mg via ORAL
  Filled 2018-03-30 (×9): qty 1

## 2018-03-30 MED ORDER — LORAZEPAM 1 MG PO TABS
1.0000 mg | ORAL_TABLET | Freq: Once | ORAL | Status: AC
  Administered 2018-03-30: 1 mg via ORAL
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
## 2018-03-30 NOTE — Progress Notes (Signed)
Pt very paranoid about her roommate, pt came out the room and sat in the hall for awhile.

## 2018-03-30 NOTE — Progress Notes (Signed)
D:  Patient's self inventory sheet, patient has fair sleep, sleep medication helpful.  Good appetite, low energy level, good concentration.  Rated depression 2, hopeless 3, anxiety 10.  Denied withdrawals.  Denied SI.  Physical problems, lightheaded.  Denied physical pain.  Goal is how to stay safe.  Plans to listen to others.  No discharge plans. A:  Medications administered per MD orders.  Emotional support and encouragement given patient. R:  Denied SI and HI, contracts for safety.  Denied A/V hallucinations.  Safety maintained with 15 minute checks.

## 2018-03-30 NOTE — Progress Notes (Signed)
Did not attend group 
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
## 2018-03-30 NOTE — Progress Notes (Signed)
Recreation Therapy Notes  Date: 6.4.19 Time: 1000 Location: 500 Hall  Group Topic: Communication, Team Building, Problem Solving  Goal Area(s) Addresses:  Patient will effectively work with peer towards shared goal.  Patient will identify skill used to make activity successful.  Patient will identify how skills used during activity can be used to reach post d/c goals.   Intervention: Trust Activity  Activity:  Patients were given rubber discs (one more than the number of people doing the activity).  Patients were to use the rubber discs to maneuver from one end of the hall to the other and back to the starting point.  Patients were to remain on their disc at all times, if anyone stepped off, the group would have to start over from the beginning.   Education: Education officer, community, Dentist.   Education Outcome: Acknowledges education/In group clarification offered/Needs additional education.   Clinical Observations/Feedback: Pt did not attend group.     [name], LRT/CTRS         [name] A 03/30/2018 11:39 AM
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
## 2018-03-30 NOTE — [facility] Group Notes (Signed)
LCSW Group Therapy Note  03/30/2018 1:15pm  Type of Therapy/Topic:  Group Therapy:  Feelings about Diagnosis  Participation Level:  Did Not Attend   Description of Group:   This group will allow patients to explore their thoughts and feelings about diagnoses they have received. Patients will be guided to explore their level of understanding and acceptance of these diagnoses. Facilitator will encourage patients to process their thoughts and feelings about the reactions of others to their diagnosis and will guide patients in identifying ways to discuss their diagnosis with significant others in their lives. This group will be process-oriented, with patients participating in exploration of their own experiences, giving and receiving support, and processing challenge from other group members.   Therapeutic Goals: 1. Patient will demonstrate understanding of diagnosis as evidenced by identifying two or more symptoms of the disorder 2. Patient will be able to express two feelings regarding the diagnosis 3. Patient will demonstrate their ability to communicate their needs through discussion and/or role play  Summary of Patient Progress:       Therapeutic Modalities:   Cognitive Behavioral Therapy Brief Therapy Feelings Identification    Trish Mage, [place] 03/30/2018 2:06 PM

## 2018-03-30 NOTE — Plan of Care (Signed)
Nurse discussed depression, anxiety, coping skills with patient.  

## 2018-03-31 NOTE — [facility] Group Notes (Signed)
LCSW Group Therapy Note  03/31/2018 1:15pm  Type of Therapy/Topic:  Group Therapy:  Balance in Life  Participation Level:  Did Not Attend  Description of Group:    This group will address the concept of balance and how it feels and looks when one is unbalanced. Patients will be encouraged to process areas in their lives that are out of balance and identify reasons for remaining unbalanced. Facilitators will guide patients in utilizing problem-solving interventions to address and correct the stressor making their life unbalanced. Understanding and applying boundaries will be explored and addressed for obtaining and maintaining a balanced life. Patients will be encouraged to explore ways to assertively make their unbalanced needs known to significant others in their lives, using other group members and facilitator for support and feedback.  Therapeutic Goals: 1. Patient will identify two or more emotions or situations they have that consume much of in their lives. 2. Patient will identify signs/triggers that life has become out of balance:  3. Patient will identify two ways to set boundaries in order to achieve balance in their lives:  4. Patient will demonstrate ability to communicate their needs through discussion and/or role plays  Summary of Patient Progress:      Therapeutic Modalities:   Cognitive Behavioral Therapy Solution-Focused Therapy Assertiveness Training  Trish Mage, [place] 03/31/2018 3:58 PM

## 2018-03-31 NOTE — Progress Notes (Signed)
Adult Psychoeducational Group Note  Date:  03/31/2018 Time:  9:09 PM  Group Topic/Focus:  Wrap-Up Group:   The focus of this group is to help patients review their daily goal of treatment and discuss progress on daily workbooks.  Participation Level:  Minimal  Participation Quality:  Appropriate  Affect:  Flat  Cognitive:  Oriented  Insight: Limited  Engagement in Group:  Engaged  Modes of Intervention:  Socialization  Additional Comments:  Patient attended and participated in group tonight. She reports that today she had some sleep. She went to the cafeteria to chose her meals.   [name] [facility] 03/31/2018, 9:09 PM
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
## 2018-03-31 NOTE — Progress Notes (Signed)
Pt denies pain or discomfort.  Pt denies SI, HI and AVH.  Pt contracts for safety.  Pt appears to be responding at times to internal stimuli. Pt c/o of incontinence.  Pt instructed to use bathroom often to keep bladder empty. "That's not what I asked.  I want the doctor to give me something:.  Pt attends group long enough to get snack.  Pt returns for second snack of pudding, ice cream and popcorn.  Approaches nursing station, "I want ice cream, a pop cicle and ginger ale",  Pt advised that snack time is over and she can have water until breakfast.  Pt goes to each staff member wanting more food.  Pt is denied each time.  Pt goes to room and is "sulking:,  Pt refuses medications. Pt remains safe on unit and is sleeping at this time.

## 2018-03-31 NOTE — Progress Notes (Signed)
Patient remains fearful of others.  Patient states when she is in large crowds she is unable to handle the way she feels.  Stating she feels like she is going to pass out and it is overwhelming.  Patient denies SI, HI and AVH but continues to endorse feelings of being unsafe for on identifiable reason.   Assess patient for safety, offer medications as prescribed, engage patient in 1:1 staff talks.   Patient able to contract for safety.  Continue to monitor as planned.

## 2018-03-31 NOTE — Progress Notes (Signed)
Recreation Therapy Notes  Date: 6.5.19 Time: 1000 Location: 500 Hall Dayroom  Group Topic: Wellness  Goal Area(s) Addresses:  Patient will define components of whole wellness. Patient will verbalize benefit of whole wellness.  Intervention:  Exercise     Activity: LRT and tech (Janette) lead patients in a series of stretches before allowing each patient to lead the group in an exercise of their choice.    Education: Wellness, Dentist.   Education Outcome: Acknowledges education/In group clarification offered/Needs additional education.   Clinical Observations/Feedback: Pt did not attend group.    [name], LRT/CTRS      [name] A 03/31/2018 11:27 AM

## 2018-04-01 DIAGNOSIS — Z9119 Patient's noncompliance with other medical treatment and regimen: Secondary | ICD-10-CM

## 2018-04-01 DIAGNOSIS — G259 Extrapyramidal and movement disorder, unspecified: Secondary | ICD-10-CM

## 2018-04-01 DIAGNOSIS — F329 Major depressive disorder, single episode, unspecified: Secondary | ICD-10-CM

## 2018-04-01 MED ORDER — FLUPHENAZINE HCL 5 MG PO TABS
5.0000 mg | ORAL_TABLET | ORAL | Status: DC
  Administered 2018-04-02: 5 mg via ORAL
  Filled 2018-04-01 (×2): qty 1

## 2018-04-01 MED ORDER — FLUPHENAZINE HCL 10 MG PO TABS
10.0000 mg | ORAL_TABLET | Freq: Every day | ORAL | Status: DC
  Filled 2018-04-01 (×2): qty 1

## 2018-04-01 NOTE — BHH Group Notes (Signed)
[facility] Mental Health Association Group Therapy  04/01/2018 , 1:23 PM    Type of Therapy:  Mental Health Association Presentation  Participation Level:  Invited.  Chose to not attend  Participation Quality:  Attentive  Affect:  Blunted  Cognitive:  Oriented  Insight:  Limited  Engagement in Therapy:  Engaged  Modes of Intervention:  Discussion, Education and Socialization  Summary of Progress/Problems:  Tammi  from [place] came to present her recovery story, encourage group  members to share something about their story, and present information about the MHA.    Roque Lias B 04/01/2018 , 1:23 PM
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
## 2018-04-01 NOTE — BHH Group Notes (Signed)
[place] Group Notes:  (Nursing/MHT/Case Management/Adjunct)  Date:  04/01/2018  Time:  5:36 PM  Type of Therapy:  Psychoeducational Skills  Participation Level:  Minimal  Participation Quality:  Attentive  Affect:  Appropriate  Cognitive:  Appropriate  Insight:  Appropriate  Engagement in Group:  Engaged  Modes of Intervention:  Discussion and Education  Summary of Progress/Problems: Discussed crisis management.  Patient was attentive and receptive.  Coralyn Mark Dearies Meikle 04/01/2018, 5:36 PM
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
## 2018-04-01 NOTE — Plan of Care (Signed)
  Problem: Safety: Goal: Periods of time without injury will increase Outcome: Progressing   Problem: Activity: Goal: Interest or engagement in leisure activities will improve Outcome: Not Progressing   Problem: Coping: Goal: Will verbalize feelings Outcome: Progressing   Problem: Self-Concept: Goal: Level of anxiety will decrease Outcome: Not Progressing  DAR NOTE: Patient presents with anxious affect and mood.  Denies suicidal thought, pain, auditory and visual hallucinations.  Paranoid about being around other people.  Rates depression at 1, hopelessness at 0, and anxiety at 9.  Maintained on routine safety checks.  Medications given as prescribed.  Support and encouragement offered as needed.  States goal for today is "staying safe."  Patient observed pacing the hallway and responding to internal stimuli.  Patient preoccupied with getting discharge.  Offered no complaint.

## 2018-04-01 NOTE — Progress Notes (Signed)
Pt refuses to attend group.  Being around all these people makes me nervous.  Pt hovers outside door until group is over and has no problem getting as many snacks as possible and sitting in the middle of the group to eat them.  Pt complains that tech is eating popcorn that the tech bought and will not give patient any.  Pt sts staff is hiding all the snacks and will not give her any and that staff is eating all the patients food.  Pt is demanding of food at all times and asks each staff member for food.  If pt does not get ginger ale, she will not take her meds.  Pt complains in a loud voice, as she is eating pudding and drinks that we are not letting her eat what she wants, when she wants.  Pt has refused to go to cafeteria after finding out her roommate was on unit restriction and getting meals delivered.  "I want my meals brought to my room".  Pt denied this request and advised she needs to go to cafeteria for meals.  Pt is provided cereal when she refuses to eat in cafeteria. Pt refuses to answer assessment questions and sts she wont take her medicine because we (staff) are hiding food from her. Pt is in hallway and at nurses station complaining about things that did not exist. Pt remains safe on unit.  Pt is checked q 15 min.

## 2018-04-01 NOTE — Progress Notes (Signed)
Pt invited but declined wrap-up group this evening.

## 2018-04-01 NOTE — Progress Notes (Signed)
Recreation Therapy Notes  Date: 6.6.19 Time: 1000 Location: 500 Hall Dayroom  Group Topic: Communication, Team Building, Problem Solving  Goal Area(s) Addresses:  Patient will effectively work with peer towards shared goal.  Patient will identify skill used to make activity successful.  Patient will identify how skills used during activity can be used to reach post d/c goals.   Intervention: STEM Activity   Activity: [employer]. In teams, patients were asked to build the tallest freestanding tower possible out of 15 pipe cleaners. Systematically resources were removed, for example patient ability to use both hands and patient ability to verbally communicate.    Education: Education officer, community, Dentist.   Education Outcome: Acknowledges education/In group clarification offered/Needs additional education.   Clinical Observations/Feedback: Pt did not attend group.    [name], LRT/CTRS         [name], Gian Ybarra A 04/01/2018 11:23 AM

## 2018-04-02 MED ORDER — FLUPHENAZINE HCL 10 MG PO TABS
10.0000 mg | ORAL_TABLET | Freq: Every day | ORAL | Status: DC
  Administered 2018-04-02 – 2018-04-04 (×3): 10 mg via ORAL
  Filled 2018-04-02 (×5): qty 1
  Filled 2018-04-02: qty 2

## 2018-04-02 MED ORDER — FLUPHENAZINE HCL 5 MG PO TABS
5.0000 mg | ORAL_TABLET | Freq: Two times a day (BID) | ORAL | Status: DC
  Administered 2018-04-02 – 2018-04-05 (×6): 5 mg via ORAL
  Filled 2018-04-02: qty 28
  Filled 2018-04-02 (×2): qty 1
  Filled 2018-04-02: qty 28
  Filled 2018-04-02 (×2): qty 1
  Filled 2018-04-02: qty 28
  Filled 2018-04-02: qty 1
  Filled 2018-04-02: qty 28
  Filled 2018-04-02 (×3): qty 1

## 2018-04-02 NOTE — Plan of Care (Signed)
D: Pt denies SI/HI/AVH. Pt stated she was doing better, pt stated she wanted to D/C on Monday. Pt visible in dayroom at times  A: Pt was offered support and encouragement. Pt was given scheduled medications. Pt was encourage to attend groups. Q 15 minute checks were done for safety.   R:Pt attends groups and interacts well with peers and staff. Pt is taking medication. Pt receptive to treatment and safety maintained on unit.   Problem: Education: Goal: Emotional status will improve 04/02/2018 2249 by Providence Crosby, RN Outcome: Progressing 04/02/2018 2041 by Providence Crosby, RN Outcome: Progressing   Problem: Education: Goal: Mental status will improve 04/02/2018 2249 by Providence Crosby, RN Outcome: Progressing 04/02/2018 2041 by Providence Crosby, RN Outcome: Progressing   Problem: Activity: Goal: Sleeping patterns will improve 04/02/2018 2249 by Providence Crosby, RN Outcome: Progressing 04/02/2018 2041 by Providence Crosby, RN Outcome: Progressing

## 2018-04-02 NOTE — Plan of Care (Signed)
  Problem: Activity: Goal: Interest or engagement in activities will improve Outcome: Not Progressing   Problem: Safety: Goal: Periods of time without injury will increase Outcome: Progressing   Problem: Health Behavior/Discharge Planning: Goal: Compliance with prescribed medication regimen will improve Outcome: Progressing  DAR NOTE: Patient presents with anxious affect and  mood.  Denies suicidal thoughts, pain, auditory and visual hallucinations.  Rates depression at 1, hopelessness at 0, and anxiety at 9.  Maintained on routine safety checks.  Medications given as prescribed.  Support and encouragement offered as needed.  States goal for today is "staying safe even in here."  Patient visible in milieu with minimal interaction. Offered no complaint.

## 2018-04-02 NOTE — Plan of Care (Addendum)
D: Pt denies SI/HI/AVH. Pt continues to be needy , pt visible in dayroom some of the evening. Pt continues to keep to herself this evening.   A: Pt was offered support and encouragement. Pt was given scheduled medications. Pt was encourage to attend groups. Q 15 minute checks were done for safety.   R:Pt attends groups and interacts well with peers and staff. Pt is taking medication. Pt has no complaints.Pt receptive to treatment and safety maintained on unit.   Problem: Education: Goal: Emotional status will improve Outcome: Progressing   Problem: Education: Goal: Mental status will improve Outcome: Progressing   Problem: Activity: Goal: Sleeping patterns will improve Outcome: Progressing   Problem: Safety: Goal: Periods of time without injury will increase Outcome: Progressing

## 2018-04-02 NOTE — Progress Notes (Signed)
Recreation Therapy Notes  Date: 6.7.19 Time: 1000 Location: 500 Hall Dayroom  Group Topic: Anger Management  Goal Area(s) Addresses:  Patient will identify triggers for anger.  Patient will identify physical reaction to anger.   Patient will identify benefit of using coping skills when angry.  Intervention:  Worksheet, pencils  Activity: Triggers.  Patients were given a worksheet in which they were to identify their biggest triggers.  Patients were to then describe what strategies they use to avoid or limit their interaction with their trigger and lastly what strategies they use when they can't avoid their triggers.  Education: Anger Management, Discharge Planning   Education Outcome: Acknowledges education/In group clarification offered/Needs additional education.   Clinical Observations/Feedback: Pt did not attend group.    [name], LRT/CTRS          [name] A 04/02/2018 12:55 PM
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
## 2018-04-02 NOTE — BHH Group Notes (Signed)
[place] LCSW Group Therapy  04/02/2018  1:05 PM  Type of Therapy:  Group therapy  Participation Level: Invited.  Chose to not attend.  Participation Quality:  Attentive  Affect:  Flat  Cognitive:  Oriented  Insight:  Limited  Engagement in Therapy:  Limited  Modes of Intervention:  Discussion, Socialization  Summary of Progress/Problems:  Chaplain was here to lead a group on themes of hope and courage.  Roque Lias B 04/02/2018 1:35 PM

## 2018-04-02 NOTE — Progress Notes (Signed)
Did not attend group 

## 2018-04-03 NOTE — Plan of Care (Signed)
  Problem: Safety: Goal: Periods of time without injury will increase Outcome: Progressing   Problem: Health Behavior/Discharge Planning: Goal: Compliance with prescribed medication regimen will improve Outcome: Progressing  DAR NOTE: Patient presents with anxious affect and depressed mood.  Denies suicidal thoughts, pain, auditory and visual hallucinations.  Rates depression at 0, hopelessness at 0, and anxiety at 0.  Maintained on routine safety checks.  Medications given as prescribed.  Support and encouragement offered as needed.  States goal for today is "discharge."  Patient remained withdrawn and isolates to her room. Offered no complaint.

## 2018-04-03 NOTE — Progress Notes (Signed)
Did not attend. 

## 2018-04-03 NOTE — BHH Group Notes (Signed)
[place] Group Notes:  (Nursing/MHT/Case Management/Adjunct)  Date:  04/03/2018  Time:  5:01 PM  Type of Therapy:  Psychoeducational Skills  Participation Level:  Did Not Attend    Coralyn Mark Iven Earnhart 04/03/2018, 5:01 PM

## 2018-04-03 NOTE — Plan of Care (Signed)
D: Pt denies SI/HI/AVH. Pt is pleasant and cooperative. Pt continues to be suspicious and paranoid. Pt continues to ask for extra snacks.   A: Pt was offered support and encouragement. Pt was given scheduled medications. Pt was encourage to attend groups. Q 15 minute checks were done for safety.   R:Pt  does not attend groups and interacts minimally with peers and staff. Pt is taking medication. Pt receptive to treatment and safety maintained on unit.   Problem: Safety: Goal: Periods of time without injury will increase Outcome: Progressing   Problem: Education: Goal: Emotional status will improve Outcome: Progressing   Problem: Health Behavior/Discharge Planning: Goal: Compliance with treatment plan for underlying cause of condition will improve Outcome: Progressing   Problem: Education: Goal: Knowledge of the prescribed therapeutic regimen will improve Outcome: Progressing   Problem: Coping: Goal: Will verbalize feelings Outcome: Progressing   Problem: Health Behavior/Discharge Planning: Goal: Compliance with therapeutic regimen will improve Outcome: Progressing
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
## 2018-04-03 NOTE — BHH Group Notes (Signed)
[place] Group Notes: (Clinical Social Work)   04/03/2018      Type of Therapy:  Group Therapy   Participation Level:  Did Not Attend despite MHT prompting   Selmer Dominion, LCSW 04/03/2018, 1:04 PM

## 2018-04-04 NOTE — Plan of Care (Signed)
D: Pt denies SI/HI/VH, pt stated she was hearing voices from "cyber space". Pt is pleasant and cooperative. Pt minimal interaction in the dayroom this evening.   A: Pt was offered support and encouragement. Pt was given scheduled medications. Pt was encourage to attend groups. Q 15 minute checks were done for safety.   R:Pt is taking medication. Pt receptive to treatment and safety maintained on unit.   Problem: Education: Goal: Emotional status will improve Outcome: Progressing   Problem: Education: Goal: Mental status will improve Outcome: Not Progressing   Problem: Activity: Goal: Sleeping patterns will improve Outcome: Progressing   Problem: Coping: Goal: Ability to use eye contact when communicating with others will improve Outcome: Progressing   Problem: Education: Goal: Will be free of psychotic symptoms Outcome: Not Progressing
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
## 2018-04-04 NOTE — Plan of Care (Signed)
Nurse discussed depression, anxiety, coping skills with patient.  

## 2018-04-04 NOTE — [facility] Group Notes (Signed)
[facility] LCSW Group Therapy Note  Date/Time:  04/04/2018  11:00AM-12:00PM  Type of Therapy and Topic:  Group Therapy:  Music and Mood  Participation Level:  Did Not Attend   Description of Group: In this process group, members listened to a variety of genres of music and identified that different types of music evoke different responses.  Patients were encouraged to identify music that was soothing for them and music that was energizing for them.  Patients discussed how this knowledge can help with wellness and recovery in various ways including managing depression and anxiety as well as encouraging healthy sleep habits.    Therapeutic Goals: 1. Patients will explore the impact of different varieties of music on mood 2. Patients will verbalize the thoughts they have when listening to different types of music 3. Patients will identify music that is soothing to them as well as music that is energizing to them 4. Patients will discuss how to use this knowledge to assist in maintaining wellness and recovery 5. Patients will explore the use of music as a coping skill  Summary of Patient Progress:  N/A  Therapeutic Modalities: Solution Focused Brief Therapy Activity   Selmer Dominion, LCSW

## 2018-04-04 NOTE — [facility] Group Notes (Signed)
[facility] Group Notes:  (Nursing/MHT/Case Management/Adjunct)  Date:  04/04/2018  Time:  0915  Type of Therapy:  psychoeducational therapy  Participation Level:  Did not attend  Participation Quality:    Affect:    Cognitive:    Insight:    Engagement in Group:    Modes of Intervention:    Summary of Progress/Problems:  [name] 04/04/2018, 12:46 PM

## 2018-04-05 MED ORDER — BENZTROPINE MESYLATE 0.5 MG PO TABS: 0.5000 mg | ORAL_TABLET | Freq: Two times a day (BID) | ORAL | 0 refills | Status: DC

## 2018-04-05 MED ORDER — OLANZAPINE 10 MG PO TABS: 10.0000 mg | ORAL_TABLET | Freq: Two times a day (BID) | ORAL | 0 refills | Status: DC

## 2018-04-05 MED ORDER — TRAZODONE HCL 100 MG PO TABS: 100.0000 mg | ORAL_TABLET | Freq: Every evening | ORAL | 0 refills | Status: DC | PRN

## 2018-04-05 MED ORDER — FLUPHENAZINE HCL 5 MG PO TABS: 5.0000 mg | ORAL_TABLET | Freq: Two times a day (BID) | ORAL | 0 refills | Status: DC

## 2018-04-05 NOTE — Progress Notes (Signed)
Discharge Note:  Patient discharged with bus ticket.  Patient denied SI and HI.  Denied A/V hallucinations.  Suicide prevention information given and discussed with patient who stated she understood and had no questions.  Patient stated she received all her belongings, clothing, toiletries, misc items, prescriptions, etc.  Patient stated she  appreciated all assistance received from [facility] staff.  All required discharge information given to patient at discharge.
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
## 2018-04-05 NOTE — Progress Notes (Signed)
  [facility] Adult Case Management Discharge Plan :  Will you be returning to the same living situation after discharge:  Yes,  home At discharge, do you have transportation home?: Yes,  bus pass Do you have the ability to pay for your medications: Yes,  mental health  Release of information consent forms completed and in the chart;  Patient's signature needed at discharge.  Patient to Follow up at: Follow-up Information    [facility].   Specialty:  Professional Counselor Why:  Go to the walk-inc clinic M-F between 8:30 and 11AM or 1-3:30 with in 3 days of d/c for your hospital follow up appointment. Bring your ID and your hospital d/c paperwork Contact information: [facility] [place] [address] [phone]           Next level of care provider has access to [place] and Suicide Prevention discussed: Yes,  yes  Have you used any form of tobacco in the last 30 days? (Cigarettes, Smokeless Tobacco, Cigars, and/or Pipes): No  Has patient been referred to the Quitline?: N/A patient is not a smoker  Patient has been referred for addiction treatment: [place], LCSW 04/05/2018, 10:13 AM
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
## 2018-04-05 NOTE — Plan of Care (Signed)
Pt did not attend recreational group sessions.   [name], LRT/CTRS

## 2018-04-05 NOTE — Progress Notes (Signed)
Recreation Therapy Notes  INPATIENT RECREATION TR PLAN  Patient Details Name: [name] MRN: [id number] DOB: Jun 27, 1966 Today's Date: 04/05/2018  Rec Therapy Plan Is patient appropriate for Therapeutic Recreation?: Yes Treatment times per week: about 3 days Estimated Length of Stay: 5-7 days TR Treatment/Interventions: Group participation (Comment)  Discharge Criteria Pt will be discharged from therapy if:: Discharged Treatment plan/goals/alternatives discussed and agreed upon by:: Patient/family  Discharge Summary Short term goals set: See patient care plan Short term goals met: Not met Progress toward goals comments: (None) Reason goals not met: Pt did not attend groups. Therapeutic equipment acquired: N/A Reason patient discharged from therapy: Discharge from hospital Pt/family agrees with progress & goals achieved: Yes Date patient discharged from therapy: 04/05/18    [name], LRT/CTRS  [name], [name] A 04/05/2018, 1:23 PM

## 2018-04-05 NOTE — Progress Notes (Signed)
Recreation Therapy Notes  Date: 6.10.19 Time: 1000 Location: 500 [address] Dayroom  Group Topic:  Goal Setting  Goal Area(s) Addresses:  Patient will be able to identify at least 3 life goals.  Patient will be able to identify benefit of investing in life goals.  Patient will be able to identify benefit of setting life goals.   Intervention: Worksheet  Activity: Lobbyist.  Patients were to identify goals they wanted to accomplish in a week, then next month, within a year and within the next 5 years.  Patients were to also identify obstacles to reaching goals, what they needed in order to achieve goals and what they an do tomorrow to start working towards their goals.  Education: Discharge Planning, Radiographer, therapeutic, Leisure Education  Education Outcome: Acknowledges Education/In Group Clarification Provided/Needs Additional Education  Clinical Observations: Pt did not attend group.    [name], LRT/CTRS       [name] A 04/05/2018 12:39 PM

## 2018-04-05 NOTE — Progress Notes (Signed)
D:  Patient's self inventory sheet, patient sleeps good, sleep medication helpful.  Good appetite, low energy level, good concentration.  Rated depression 1, denied hopeless, rated anxiety #9.  Denied withdrawals.  Denied SI.  Physical problems, lightheaded, dizziness.  Denied physical pain.   Goal is safety still.  Plans to go home and be safe. A:  Medications administered per MD orders.  Emotional support and encouragement given patient. R:  Patient denied SI and HI, contracts for safety.  Denied A/V hallucinations.  Safety maintained with 15 minute checks. Patient looking forward to discharge today.

## 2018-05-12 ENCOUNTER — Ambulatory Visit (INDEPENDENT_AMBULATORY_CARE_PROVIDER_SITE_OTHER): Payer: Self-pay | Admitting: Neurology

## 2018-05-12 ENCOUNTER — Other Ambulatory Visit: Payer: Self-pay

## 2018-05-12 ENCOUNTER — Encounter

## 2018-05-12 VITALS — HR 73 | Ht 65.5 in | Wt 263.0 lb

## 2018-05-12 DIAGNOSIS — R42 Dizziness and giddiness: Secondary | ICD-10-CM

## 2018-05-12 NOTE — Patient Instructions (Addendum)
1. Schedule open MRI brain without contrast  We have sent a referral for your OPEN MRI to [facility].  They will contact you directly to schedule your appointment.  [facility] is located at [address] [place], [place] [address].  If you need to speak with [facility] for any reason, they can be reached at [phone]  2. Discuss with Family Services getting you set up with a regular medical doctor and Psychiatry to help with emotional support for all the physical symptoms you are going through 3. Our office will call you with results, if normal, follow-up with regular doctor
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
## 2018-05-19 ENCOUNTER — Encounter: Payer: Self-pay | Admitting: Neurology

## 2018-05-20 ENCOUNTER — Ambulatory Visit (HOSPITAL_COMMUNITY)
Admission: EM | Admit: 2018-05-20 | Discharge: 2018-05-20 | Disposition: A | Discharge status: Home / Self Care | Payer: Self-pay | Attending: Internal Medicine | Admitting: Internal Medicine

## 2018-05-20 ENCOUNTER — Encounter (HOSPITAL_COMMUNITY): Payer: Self-pay

## 2018-05-20 ENCOUNTER — Ambulatory Visit (INDEPENDENT_AMBULATORY_CARE_PROVIDER_SITE_OTHER): Payer: Self-pay

## 2018-05-20 DIAGNOSIS — Z833 Family history of diabetes mellitus: Secondary | ICD-10-CM | POA: Insufficient documentation

## 2018-05-20 DIAGNOSIS — I10 Essential (primary) hypertension: Secondary | ICD-10-CM | POA: Insufficient documentation

## 2018-05-20 DIAGNOSIS — R42 Dizziness and giddiness: Secondary | ICD-10-CM | POA: Insufficient documentation

## 2018-05-20 DIAGNOSIS — Z8249 Family history of ischemic heart disease and other diseases of the circulatory system: Secondary | ICD-10-CM | POA: Insufficient documentation

## 2018-05-20 DIAGNOSIS — K529 Noninfective gastroenteritis and colitis, unspecified: Secondary | ICD-10-CM

## 2018-05-20 DIAGNOSIS — Z79899 Other long term (current) drug therapy: Secondary | ICD-10-CM | POA: Insufficient documentation

## 2018-05-20 DIAGNOSIS — R1030 Lower abdominal pain, unspecified: Secondary | ICD-10-CM | POA: Insufficient documentation

## 2018-05-20 DIAGNOSIS — E119 Type 2 diabetes mellitus without complications: Secondary | ICD-10-CM | POA: Insufficient documentation

## 2018-05-20 DIAGNOSIS — R1084 Generalized abdominal pain: Secondary | ICD-10-CM | POA: Insufficient documentation

## 2018-05-20 LAB — POCT URINALYSIS DIP (DEVICE)
BILIRUBIN URINE: NEGATIVE
GLUCOSE, UA: NEGATIVE mg/dL
Ketones, ur: NEGATIVE mg/dL
LEUKOCYTES UA: NEGATIVE
Nitrite: NEGATIVE
Protein, ur: NEGATIVE mg/dL
Urobilinogen, UA: 0.2 mg/dL (ref 0.0–1.0)
pH: 5 (ref 5.0–8.0)

## 2018-05-20 MED ORDER — PROCHLORPERAZINE MALEATE 10 MG PO TABS: 10.0000 mg | ORAL_TABLET | Freq: Two times a day (BID) | ORAL | 0 refills | Status: DC | PRN

## 2018-05-20 NOTE — ED Triage Notes (Signed)
Pt presents with generalized abdominal pain , vomiting and diarrhea

## 2018-05-20 NOTE — ED Notes (Signed)
Patient asking many questions at discharge

## 2018-05-20 NOTE — Discharge Instructions (Addendum)
No danger signs on exam today.  Symptoms seem most consistent with a GI bug, which should run its course in another few days. Urine test does not suggest infection.  Abdominal x-ray does not suggest constipation. Push fluids and rest.  Diet as tolerated.  Recheck for severe/sustained (>1 hour) abdominal pain, persistent (>1-2 days) vomiting, or if not starting to improve as expected in a few days.

## 2018-05-20 NOTE — ED Provider Notes (Signed)
[facility]    CSN: [id number] Arrival date & time: 05/20/18  1315     History   Chief Complaint Chief Complaint  Patient presents with  . Abdominal Pain    HPI [name] is a 52 y.o. female.   She presents today with recent history of several months of generally feeling unwell, nonspecific.  Yesterday, she had the onset of diarrhea, she describes this as a soft urgent stool daily.  She has had difficulty making it to the bathroom on time, both with stool and urine.  She has had an episode of emesis today.  No dysuria, equivocal urinary frequency.  No unusual vaginal discharge or bleeding.  No abdominal pain.   Walked into the urgent care independently, climbed on/off the exam table unassisted, went to/from the bathroom without help.  No soiling of clothing apparent. She is undergoing evaluation with neurology for chronic dizziness. She additionally mentions that she has been on 'malware' for a long time, and attributes this situation to her previous employer.   HPI  Past Medical History:  Diagnosis Date  . Anxiety   . Diabetes mellitus without complication ([place])   . Hypertension   . Major depressive disorder, recurrent, severe with psychotic features [facility])     Patient Active Problem List   Diagnosis Date Noted  . Delusional disorder ([place]) 03/26/2018  . Delusional disorder, with bizarre content, multiple episodes currently in acute episode ([place]) 01/07/2018  . Anxiety 03/29/2016  . MDD (major depressive disorder), recurrent severe, without psychosis ([place]) 11/18/2015  . Homelessness 11/18/2015  . Tobacco use disorder 07/05/2015  . Malnutrition of moderate degree ([place]) 05/31/2015  . Hypokalemia 05/30/2015  . Obstipation 05/30/2015  . Prolonged QT interval 05/30/2015  . Hypertension 05/30/2015  . 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 Suicidal ideations 03/12/2015  . Diabetes ([place]) 09/06/2012  . Paranoia ([place])  04/16/2012    History reviewed. No pertinent surgical history.   Home Medications    Prior to Admission medications   Medication Sig Start Date End Date Taking? Authorizing Provider  benztropine (COGENTIN) 0.5 MG tablet Take 1 tablet (0.5 mg total) by mouth 2 (two) times daily. 04/05/18   Derrill Center, NP  fluPHENAZine (PROLIXIN) 5 MG tablet Take 1 tablet (5 mg total) by mouth 2 (two) times daily. And take 2 tablets (10mg  total) at bedtime 04/05/18   Derrill Center, NP  OLANZapine (ZYPREXA) 10 MG tablet Take 1 tablet (10 mg total) by mouth 2 (two) times daily. 04/05/18   Derrill Center, NP  prochlorperazine (COMPAZINE) 10 MG tablet Take 1 tablet (10 mg total) by mouth 2 (two) times daily as needed for nausea or vomiting. 05/20/18   Wynona Luna, MD  traZODone (DESYREL) 100 MG tablet Take 1 tablet (100 mg total) by mouth at bedtime and may repeat dose one time if needed. 04/05/18   Derrill Center, NP    Family History Family History  Problem Relation Age of Onset  . Hypertension Mother   . Diabetes Mother   . Aneurysm Mother   . Hypertension Father   . Diabetes Father   . Hypertension Sister   . Diabetes Sister   . Hypertension Other   . Diabetes Other     Social History Social History   Tobacco Use  . Smoking status: Never Smoker  . Smokeless tobacco: Never Used  Substance Use Topics  . Alcohol use: No  . Drug use: No  Allergies   Patient has no known allergies.   Review of Systems Review of Systems  All other systems reviewed and are negative.    Physical Exam Triage Vital Signs ED Triage Vitals  Enc Vitals Group     BP 05/20/18 1426 116/78     Pulse Rate 05/20/18 1426 72     Resp 05/20/18 1426 20     Temp 05/20/18 1426 98.3 F (36.8 C)     Temp Source 05/20/18 1426 Temporal     SpO2 05/20/18 1426 100 %     Weight --      Height --      Pain Score 05/20/18 1424 4     Pain Loc --    Updated Vital Signs BP 116/78 (BP Location: Left Arm)    Pulse 72   Temp 98.3 F (36.8 C) (Temporal)   Resp 20   LMP 10/29/2011 Comment: NEG U PREG 02/24/17  SpO2 100%  Physical Exam  Constitutional: She is oriented to person, place, and time. No distress.  Alert, nicely groomed  HENT:  Head: Atraumatic.  Eyes:  Conjugate gaze, no eye redness/drainage  Neck: Neck supple.  Cardiovascular: Normal rate and regular rhythm.  Pulmonary/Chest: No respiratory distress. She has no rhonchi. She has no rales.  Lungs clear, symmetric breath sounds  Abdominal: Soft. She exhibits no distension. There is no tenderness. There is no rebound and no guarding.  Musculoskeletal: Normal range of motion.  No leg swelling  Neurological: She is alert and oriented to person, place, and time.  Skin: Skin is warm and dry.  No cyanosis  Nursing note and vitals reviewed.    UC Treatments / Results  Labs Results for orders placed or performed during the hospital encounter of 05/20/18  Urine culture  Result Value Ref Range   Specimen Description URINE, RANDOM    Special Requests NONE    Culture      NO GROWTH Performed at [facility], [address] [place], [place] [address]    Report Status 05/21/2018 FINAL   POCT urinalysis dip (device)  Result Value Ref Range   Glucose, UA NEGATIVE NEGATIVE mg/dL   Bilirubin Urine NEGATIVE NEGATIVE   Ketones, ur NEGATIVE NEGATIVE mg/dL   Specific Gravity, Urine >=1.030 1.005 - 1.030   Hgb urine dipstick TRACE (A) NEGATIVE   pH 5.0 5.0 - 8.0   Protein, ur NEGATIVE NEGATIVE mg/dL   Urobilinogen, UA 0.2 0.0 - 1.0 mg/dL   Nitrite NEGATIVE NEGATIVE   Leukocytes, UA NEGATIVE NEGATIVE    EKG None  Radiology EXAM: DG ABDOMEN ACUTE W/ 1V CHEST  COMPARISON: Chest radiograph, 08/14/2017  FINDINGS: Normal bowel gas pattern. There is mild generalized increased stool burden in the colon. No free air.  No evidence of renal or ureteral stones. Abdominopelvic soft tissues are unremarkable.  Heart,  mediastinum hila are unremarkable. Clear lungs.  No acute/significant skeletal abnormality  IMPRESSION: 1. No acute findings. No evidence of bowel obstruction or free air. 2. Mild generalized increased stool throughout the colon. 3. No active cardiopulmonary disease.   Electronically Signed By: Lajean Manes M.D. On: 05/20/2018 15:32  Procedures Procedures (including critical care time)    Final Clinical Impressions(s) / UC Diagnoses   Final diagnoses:  Acute gastroenteritis     Discharge Instructions     No danger signs on exam today.  Symptoms seem most consistent with a GI bug, which should run its course in another few days. Urine test does not  suggest infection.  Abdominal x-ray does not suggest constipation. Push fluids and rest.  Diet as tolerated.  Recheck for severe/sustained (>1 hour) abdominal pain, persistent (>1-2 days) vomiting, or if not starting to improve as expected in a few days.    ED Prescriptions    Medication Sig Dispense Auth. Provider   prochlorperazine (COMPAZINE) 10 MG tablet Take 1 tablet (10 mg total) by mouth 2 (two) times daily as needed for nausea or vomiting. 10 tablet Wynona Luna, MD        Wynona Luna, MD 05/24/18 2100

## 2018-05-21 LAB — URINE CULTURE: CULTURE: NO GROWTH

## 2018-05-27 ENCOUNTER — Telehealth: Payer: Self-pay | Admitting: Neurology

## 2018-05-27 NOTE — Telephone Encounter (Signed)
Orders faxed to [facility].

## 2018-05-27 NOTE — Telephone Encounter (Signed)
Pt called to inform that [facility] did not receive order for open MRI so pt could not schedule appt.

## 2018-06-10 ENCOUNTER — Encounter (HOSPITAL_COMMUNITY): Payer: Self-pay | Admitting: Emergency Medicine

## 2018-06-10 ENCOUNTER — Emergency Department (HOSPITAL_COMMUNITY)
Admission: EM | Admit: 2018-06-10 | Discharge: 2018-06-10 | Disposition: A | Discharge status: Home / Self Care | Payer: Medicare Other | Attending: Emergency Medicine | Admitting: Emergency Medicine

## 2018-06-10 DIAGNOSIS — I10 Essential (primary) hypertension: Secondary | ICD-10-CM | POA: Diagnosis not present

## 2018-06-10 DIAGNOSIS — E119 Type 2 diabetes mellitus without complications: Secondary | ICD-10-CM | POA: Insufficient documentation

## 2018-06-10 DIAGNOSIS — Z79899 Other long term (current) drug therapy: Secondary | ICD-10-CM | POA: Diagnosis not present

## 2018-06-10 DIAGNOSIS — R319 Hematuria, unspecified: Secondary | ICD-10-CM

## 2018-06-10 LAB — I-STAT BETA HCG BLOOD, ED (MC, WL, AP ONLY)

## 2018-06-10 LAB — CBC
HCT: 38.9 % (ref 36.0–46.0)
Hemoglobin: 11.7 g/dL — ABNORMAL LOW (ref 12.0–15.0)
MCH: 25.4 pg — ABNORMAL LOW (ref 26.0–34.0)
MCHC: 30.1 g/dL (ref 30.0–36.0)
MCV: 84.4 fL (ref 78.0–100.0)
PLATELETS: 300 10*3/uL (ref 150–400)
RBC: 4.61 MIL/uL (ref 3.87–5.11)
RDW: 14.1 % (ref 11.5–15.5)
WBC: 5.4 10*3/uL (ref 4.0–10.5)

## 2018-06-10 LAB — BASIC METABOLIC PANEL
Anion gap: 8 (ref 5–15)
BUN: 21 mg/dL — ABNORMAL HIGH (ref 6–20)
CALCIUM: 8.9 mg/dL (ref 8.9–10.3)
CO2: 22 mmol/L (ref 22–32)
CREATININE: 0.82 mg/dL (ref 0.44–1.00)
Chloride: 110 mmol/L (ref 98–111)
Glucose, Bld: 135 mg/dL — ABNORMAL HIGH (ref 70–99)
Potassium: 4.4 mmol/L (ref 3.5–5.1)
SODIUM: 140 mmol/L (ref 135–145)

## 2018-06-10 LAB — URINALYSIS, ROUTINE W REFLEX MICROSCOPIC
Bilirubin Urine: NEGATIVE
Glucose, UA: NEGATIVE mg/dL
Hgb urine dipstick: NEGATIVE
Ketones, ur: NEGATIVE mg/dL
Nitrite: NEGATIVE
PROTEIN: NEGATIVE mg/dL
Specific Gravity, Urine: 1.019 (ref 1.005–1.030)
pH: 5 (ref 5.0–8.0)

## 2018-06-10 NOTE — ED Triage Notes (Signed)
Pt states she has been having flank pain x1 month, today she began having hematuria, states that she was seen in July at urgent care and had an "xray of my stomach" but they did not find anything. Pt denies any urinary frequency or pain.

## 2018-06-10 NOTE — Discharge Instructions (Signed)
If you develop worsening bleeding or if you find vaginal bleeding, rectal bleeding, or if you develop dizziness or lightheadedness, shortness of breath or chest pain, or any other new/concerning symptoms and return to the ER for evaluation.
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
## 2018-06-10 NOTE — ED Notes (Signed)
Patient given discharge instructions and verbalized understanding.  Patient stable to discharge at this time.  Patient is alert and oriented to baseline.  No distressed noted at this time.  All belongings taken with the patient at discharge.   

## 2018-06-11 LAB — URINE CULTURE

## 2018-06-22 ENCOUNTER — Encounter: Payer: Self-pay | Admitting: Family Medicine

## 2018-08-01 ENCOUNTER — Emergency Department (HOSPITAL_BASED_OUTPATIENT_CLINIC_OR_DEPARTMENT_OTHER)
Admission: EM | Admit: 2018-08-01 | Discharge: 2018-08-01 | Discharge status: Against Medical Advice | Payer: Medicare Other

## 2018-08-10 ENCOUNTER — Other Ambulatory Visit: Payer: Self-pay

## 2018-08-10 ENCOUNTER — Encounter (HOSPITAL_COMMUNITY): Payer: Self-pay

## 2018-08-10 ENCOUNTER — Emergency Department (HOSPITAL_COMMUNITY)
Admission: EM | Admit: 2018-08-10 | Discharge: 2018-08-10 | Disposition: A | Discharge status: Home / Self Care | Payer: Medicare Other | Attending: Emergency Medicine | Admitting: Emergency Medicine

## 2018-08-10 DIAGNOSIS — E119 Type 2 diabetes mellitus without complications: Secondary | ICD-10-CM | POA: Diagnosis not present

## 2018-08-10 DIAGNOSIS — I10 Essential (primary) hypertension: Secondary | ICD-10-CM | POA: Insufficient documentation

## 2018-08-10 DIAGNOSIS — F22 Delusional disorders: Secondary | ICD-10-CM

## 2018-08-10 DIAGNOSIS — Z79899 Other long term (current) drug therapy: Secondary | ICD-10-CM | POA: Diagnosis not present

## 2018-08-10 DIAGNOSIS — R4182 Altered mental status, unspecified: Secondary | ICD-10-CM | POA: Diagnosis present

## 2018-08-10 NOTE — ED Notes (Signed)
Pt came outside of room stating that she wants to go, PA asked her to step back in the room and is speaking with her now.

## 2018-08-10 NOTE — ED Notes (Signed)
Pt left AMA, refused to sign stating understanding of pros and cons of leaving. Pt out of ED ambulatory.
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
## 2018-08-13 ENCOUNTER — Other Ambulatory Visit: Payer: Self-pay

## 2018-08-13 ENCOUNTER — Encounter (HOSPITAL_COMMUNITY): Payer: Self-pay

## 2018-08-13 ENCOUNTER — Emergency Department (HOSPITAL_COMMUNITY)
Admission: EM | Admit: 2018-08-13 | Discharge: 2018-08-14 | Disposition: A | Discharge status: Other Acute Inpatient Hospital | Payer: Medicare Other | Attending: Emergency Medicine | Admitting: Emergency Medicine

## 2018-08-13 DIAGNOSIS — F99 Mental disorder, not otherwise specified: Secondary | ICD-10-CM | POA: Diagnosis not present

## 2018-08-13 DIAGNOSIS — F22 Delusional disorders: Secondary | ICD-10-CM | POA: Diagnosis not present

## 2018-08-13 DIAGNOSIS — I10 Essential (primary) hypertension: Secondary | ICD-10-CM | POA: Insufficient documentation

## 2018-08-13 DIAGNOSIS — E119 Type 2 diabetes mellitus without complications: Secondary | ICD-10-CM | POA: Insufficient documentation

## 2018-08-13 DIAGNOSIS — F29 Unspecified psychosis not due to a substance or known physiological condition: Secondary | ICD-10-CM | POA: Diagnosis not present

## 2018-08-13 DIAGNOSIS — F333 Major depressive disorder, recurrent, severe with psychotic symptoms: Secondary | ICD-10-CM | POA: Diagnosis not present
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
## 2018-08-14 ENCOUNTER — Other Ambulatory Visit: Payer: Self-pay

## 2018-08-14 ENCOUNTER — Encounter (HOSPITAL_COMMUNITY): Payer: Self-pay | Admitting: Nurse Practitioner

## 2018-08-14 ENCOUNTER — Inpatient Hospital Stay (HOSPITAL_COMMUNITY)
Admission: AD | Admit: 2018-08-14 | Discharge: 2018-08-24 | DRG: 885 | Disposition: A | Discharge status: Home / Self Care | Payer: Medicare Other | Source: Intra-hospital | Attending: Psychiatry | Admitting: Psychiatry

## 2018-08-14 DIAGNOSIS — F419 Anxiety disorder, unspecified: Secondary | ICD-10-CM | POA: Diagnosis present

## 2018-08-14 DIAGNOSIS — F209 Schizophrenia, unspecified: Secondary | ICD-10-CM | POA: Diagnosis present

## 2018-08-14 DIAGNOSIS — F333 Major depressive disorder, recurrent, severe with psychotic symptoms: Secondary | ICD-10-CM | POA: Diagnosis present

## 2018-08-14 DIAGNOSIS — I10 Essential (primary) hypertension: Secondary | ICD-10-CM | POA: Diagnosis present

## 2018-08-14 DIAGNOSIS — G4700 Insomnia, unspecified: Secondary | ICD-10-CM | POA: Diagnosis present

## 2018-08-14 DIAGNOSIS — F22 Delusional disorders: Secondary | ICD-10-CM

## 2018-08-14 DIAGNOSIS — Z79899 Other long term (current) drug therapy: Secondary | ICD-10-CM

## 2018-08-14 DIAGNOSIS — B3749 Other urogenital candidiasis: Secondary | ICD-10-CM | POA: Diagnosis present

## 2018-08-14 DIAGNOSIS — R451 Restlessness and agitation: Secondary | ICD-10-CM | POA: Diagnosis not present

## 2018-08-14 DIAGNOSIS — E119 Type 2 diabetes mellitus without complications: Secondary | ICD-10-CM | POA: Diagnosis present

## 2018-08-14 LAB — CBC WITH DIFFERENTIAL/PLATELET
ABS IMMATURE GRANULOCYTES: 0.02 10*3/uL (ref 0.00–0.07)
Basophils Absolute: 0 10*3/uL (ref 0.0–0.1)
Basophils Relative: 0 %
Eosinophils Absolute: 0.2 10*3/uL (ref 0.0–0.5)
Eosinophils Relative: 3 %
HCT: 38.1 % (ref 36.0–46.0)
HEMOGLOBIN: 11.5 g/dL — AB (ref 12.0–15.0)
Immature Granulocytes: 0 %
Lymphocytes Relative: 33 %
Lymphs Abs: 2.3 10*3/uL (ref 0.7–4.0)
MCH: 25.1 pg — AB (ref 26.0–34.0)
MCHC: 30.2 g/dL (ref 30.0–36.0)
MCV: 83 fL (ref 80.0–100.0)
MONO ABS: 0.3 10*3/uL (ref 0.1–1.0)
MONOS PCT: 5 %
NEUTROS ABS: 4 10*3/uL (ref 1.7–7.7)
Neutrophils Relative %: 59 %
PLATELETS: 286 10*3/uL (ref 150–400)
RBC: 4.59 MIL/uL (ref 3.87–5.11)
RDW: 14.9 % (ref 11.5–15.5)
WBC: 6.9 10*3/uL (ref 4.0–10.5)
nRBC: 0 % (ref 0.0–0.2)

## 2018-08-14 LAB — COMPREHENSIVE METABOLIC PANEL
ALBUMIN: 3.9 g/dL (ref 3.5–5.0)
ALT: 22 U/L (ref 0–44)
AST: 21 U/L (ref 15–41)
Alkaline Phosphatase: 106 U/L (ref 38–126)
Anion gap: 8 (ref 5–15)
BUN: 21 mg/dL — AB (ref 6–20)
CHLORIDE: 108 mmol/L (ref 98–111)
CO2: 23 mmol/L (ref 22–32)
CREATININE: 0.84 mg/dL (ref 0.44–1.00)
Calcium: 8.9 mg/dL (ref 8.9–10.3)
GFR calc Af Amer: >60 mL/min (ref >60)
GFR calc non Af Amer: >60 mL/min (ref >60)
Glucose, Bld: 120 mg/dL — ABNORMAL HIGH (ref 70–99)
Potassium: 4 mmol/L (ref 3.5–5.1)
SODIUM: 139 mmol/L (ref 135–145)
Total Bilirubin: 0.2 mg/dL — ABNORMAL LOW (ref 0.3–1.2)
Total Protein: 7.2 g/dL (ref 6.5–8.1)

## 2018-08-14 LAB — I-STAT BETA HCG BLOOD, ED (MC, WL, AP ONLY): I-stat hCG, quantitative: <5 m[IU]/mL (ref <5)

## 2018-08-14 LAB — ETHANOL: Alcohol, Ethyl (B): <10 mg/dL (ref <10)

## 2018-08-14 MED ORDER — BENZTROPINE MESYLATE 0.5 MG PO TABS
0.5000 mg | ORAL_TABLET | Freq: Two times a day (BID) | ORAL | Status: DC
  Administered 2018-08-15 – 2018-08-24 (×18): 0.5 mg via ORAL
  Filled 2018-08-14 (×3): qty 1
  Filled 2018-08-14: qty 14
  Filled 2018-08-14 (×16): qty 1
  Filled 2018-08-14: qty 14
  Filled 2018-08-14 (×3): qty 1

## 2018-08-14 MED ORDER — ALUM & MAG HYDROXIDE-SIMETH 200-200-20 MG/5ML PO SUSP: 30.0000 mL | ORAL | Status: DC | PRN

## 2018-08-14 MED ORDER — ZIPRASIDONE MESYLATE 20 MG IM SOLR
20.0000 mg | Freq: Two times a day (BID) | INTRAMUSCULAR | Status: DC | PRN
  Administered 2018-08-15: 20 mg via INTRAMUSCULAR
  Filled 2018-08-14 (×2): qty 20

## 2018-08-14 MED ORDER — LORAZEPAM 1 MG PO TABS
1.0000 mg | ORAL_TABLET | ORAL | Status: AC | PRN
  Administered 2018-08-15: 1 mg via ORAL
  Filled 2018-08-14: qty 1

## 2018-08-14 MED ORDER — FLUPHENAZINE HCL 5 MG PO TABS
5.0000 mg | ORAL_TABLET | Freq: Two times a day (BID) | ORAL | Status: DC
  Administered 2018-08-14: 5 mg via ORAL
  Filled 2018-08-14 (×2): qty 1

## 2018-08-14 MED ORDER — MAGNESIUM HYDROXIDE 400 MG/5ML PO SUSP: 30.0000 mL | Freq: Every day | ORAL | Status: DC | PRN

## 2018-08-14 MED ORDER — ACETAMINOPHEN 325 MG PO TABS: 650.0000 mg | ORAL_TABLET | ORAL | Status: DC | PRN

## 2018-08-14 MED ORDER — TRAZODONE HCL 100 MG PO TABS
100.0000 mg | ORAL_TABLET | Freq: Every evening | ORAL | Status: DC | PRN
  Administered 2018-08-14 – 2018-08-23 (×13): 100 mg via ORAL
  Filled 2018-08-14 (×28): qty 1

## 2018-08-14 MED ORDER — OLANZAPINE 10 MG PO TABS
10.0000 mg | ORAL_TABLET | Freq: Two times a day (BID) | ORAL | Status: DC
  Administered 2018-08-14: 10 mg via ORAL
  Filled 2018-08-14: qty 1

## 2018-08-14 MED ORDER — OLANZAPINE 10 MG PO TABS
10.0000 mg | ORAL_TABLET | Freq: Every day | ORAL | Status: DC
  Administered 2018-08-14: 10 mg via ORAL
  Filled 2018-08-14 (×2): qty 1

## 2018-08-14 MED ORDER — TRAZODONE HCL 100 MG PO TABS: 100.0000 mg | ORAL_TABLET | Freq: Every evening | ORAL | Status: DC | PRN

## 2018-08-14 MED ORDER — BENZTROPINE MESYLATE 0.5 MG PO TABS
0.5000 mg | ORAL_TABLET | Freq: Two times a day (BID) | ORAL | Status: DC
  Administered 2018-08-14: 0.5 mg via ORAL
  Filled 2018-08-14: qty 1

## 2018-08-14 MED ORDER — FLUPHENAZINE HCL 5 MG PO TABS
5.0000 mg | ORAL_TABLET | Freq: Two times a day (BID) | ORAL | Status: DC
  Administered 2018-08-15 – 2018-08-19 (×9): 5 mg via ORAL
  Filled 2018-08-14 (×14): qty 1

## 2018-08-14 MED ORDER — OLANZAPINE 10 MG PO TABS: 10.0000 mg | ORAL_TABLET | Freq: Two times a day (BID) | ORAL | Status: DC
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
## 2018-08-14 NOTE — Progress Notes (Signed)
Pt does not attend group.  At med pass, asks for benzos., is eating a large volume of food on the unit and complaining that we have not arranged for a scan to find where the cyber where is located in her body.  Pt also is concerned about lawsuit she filed a few years ago.  Pt slept most of the first shift. Pt does not answer when asked if she is SI or HI.  Pt is med compliant. Pt maintained on 15 min checks for safety. Pt remains safe on unit.
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
## 2018-08-14 NOTE — ED Notes (Signed)
Report called to [facility] 
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
## 2018-08-14 NOTE — Progress Notes (Signed)
Pt accepted after 09:00 to [facility] [id number], to Dr. Nancy Fetter, MD. Call to report [phone]. ED staff Shirlean Mylar, RN and EDP Varney Biles, MD have been advised.   Support paperwork to be completed prior to transport.  Lind Covert, MSW, LCSW Therapeutic Triage Specialist  [phone]

## 2018-08-14 NOTE — Progress Notes (Signed)
Did not attend group 

## 2018-08-15 MED ORDER — ZIPRASIDONE MESYLATE 20 MG IM SOLR
20.0000 mg | Freq: Once | INTRAMUSCULAR | Status: AC
  Administered 2018-08-15: 20 mg via INTRAMUSCULAR
  Filled 2018-08-15: qty 20

## 2018-08-15 MED ORDER — OLANZAPINE 10 MG PO TABS
10.0000 mg | ORAL_TABLET | Freq: Two times a day (BID) | ORAL | Status: DC
  Administered 2018-08-15 – 2018-08-19 (×9): 10 mg via ORAL
  Filled 2018-08-15 (×14): qty 1
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
## 2018-08-15 NOTE — [facility] Counselor (Signed)
Clinical Social Work Note  Psychosocial Assessment not attempted due to patient's irritability about being asked questions.  CSW to follow up tomorrow.  Selmer Dominion, LCSW 08/15/2018, 3:20 PM
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
## 2018-08-15 NOTE — BHH Group Notes (Signed)
[place] LCSW Group Therapy Note  Date/Time:  08/15/2018  11:00AM-12:00PM  Type of Therapy and Topic:  Group Therapy:  Music and Mood  Participation Level:  Minimal   Description of Group: In this process group, members listened to a variety of genres of music and identified that different types of music evoke different responses.  Patients were encouraged to identify music that was soothing for them and music that was energizing for them.  Patients discussed how this knowledge can help with wellness and recovery in various ways including managing depression and anxiety as well as encouraging healthy sleep habits.    Therapeutic Goals: 1. Patients will explore the impact of different varieties of music on mood 2. Patients will verbalize the thoughts they have when listening to different types of music 3. Patients will identify music that is soothing to them as well as music that is energizing to them 4. Patients will discuss how to use this knowledge to assist in maintaining wellness and recovery 5. Patients will explore the use of music as a coping skill  Summary of Patient Progress:  At the beginning of group, patient expressed that she did not want to answer CSW's question about how she felt.  She was quite irritable in saying this, then abruptly left the room.  She returned several times, but then would leave again.  At one point, she sang along with a gospel song, while making bizarre jerky movements with her hands as though pushing something off her.  At another point she started crying loudly and saying "Damn, damn, damn" over and over.  She also added "I hurt so bad" and continued crying at a high volume.  She was gently asked to [facility] and left the room to talk to staff.  Therapeutic Modalities: Solution Focused Brief Therapy Activity   Selmer Dominion, LCSW

## 2018-08-15 NOTE — Progress Notes (Signed)
Patient ID: [name], female   DOB: 07-13-1966, 52 y.o.   MRN: [id number] PER STATE REGULATIONS 482.30  THIS CHART WAS REVIEWED FOR MEDICAL NECESSITY WITH RESPECT TO THE PATIENT'S ADMISSION/DURATION OF STAY.  NEXT REVIEW DATE:08/18/18  Roma Schanz, RN, BSN CASE MANAGER

## 2018-08-15 NOTE — Progress Notes (Signed)
Did not attend group 

## 2018-08-16 DIAGNOSIS — G4700 Insomnia, unspecified: Secondary | ICD-10-CM

## 2018-08-16 DIAGNOSIS — F419 Anxiety disorder, unspecified: Secondary | ICD-10-CM

## 2018-08-16 DIAGNOSIS — F333 Major depressive disorder, recurrent, severe with psychotic symptoms: Secondary | ICD-10-CM

## 2018-08-16 DIAGNOSIS — F209 Schizophrenia, unspecified: Principal | ICD-10-CM

## 2018-08-16 MED ORDER — LORAZEPAM 2 MG/ML IJ SOLN: 2.0000 mg | Freq: Four times a day (QID) | INTRAMUSCULAR | Status: DC | PRN

## 2018-08-16 MED ORDER — HYDROXYZINE HCL 50 MG PO TABS
50.0000 mg | ORAL_TABLET | Freq: Four times a day (QID) | ORAL | Status: DC | PRN
  Administered 2018-08-16 – 2018-08-21 (×7): 50 mg via ORAL
  Filled 2018-08-16 (×7): qty 1

## 2018-08-16 MED ORDER — LORAZEPAM 1 MG PO TABS
2.0000 mg | ORAL_TABLET | Freq: Four times a day (QID) | ORAL | Status: DC | PRN
  Administered 2018-08-16 – 2018-08-21 (×5): 2 mg via ORAL
  Filled 2018-08-16 (×5): qty 2

## 2018-08-16 NOTE — Plan of Care (Signed)
  Problem: Activity: Goal: Sleeping patterns will improve Outcome: Progressing Note:  Slept 6.75 hours last night per flowsheet.
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
## 2018-08-16 NOTE — Progress Notes (Signed)
Recreation Therapy Notes  Date: 10.21.19 Time: 1000 Location: 500 Hall Dayroom  Group Topic: Coping Skills  Goal Area(s) Addresses:  Patient will be able to identify positive coping skills. Patient will be able to identify benefit of using coping skills post d/c.  Intervention: Worksheet, pencils, [place] board, marker, eraser  Activity: Mind map.  LRT and patients filled in the first 8 boxes together with things (anger, death/loss, break up, family, anxiety, depression, fear and illness) that would require the use of coping skills.  Education: Radiographer, therapeutic, Dentist.   Education Outcome: Acknowledges understanding/In group clarification offered/Needs additional education.   Clinical Observations/Feedback: Pt did not attend group.     Otelia Hettinger [place], LRT/CTRS         [name] A 08/16/2018 12:02 PM

## 2018-08-16 NOTE — BHH Group Notes (Signed)
[place] LCSW Group Therapy Note  Date/Time:08/16/18, 1315  Type of Therapy and Topic:  Group Therapy:  Overcoming Obstacles  Participation Level:  none  Description of Group:    In this group patients will be encouraged to explore what they see as obstacles to their own wellness and recovery. They will be guided to discuss their thoughts, feelings, and behaviors related to these obstacles. The group will process together ways to cope with barriers, with attention given to specific choices patients can make. Each patient will be challenged to identify changes they are motivated to make in order to overcome their obstacles. This group will be process-oriented, with patients participating in exploration of their own experiences as well as giving and receiving support and challenge from other group members.  Therapeutic Goals: 1. Patient will identify personal and current obstacles as they relate to admission. 2. Patient will identify barriers that currently interfere with their wellness or overcoming obstacles.  3. Patient will identify feelings, thought process and behaviors related to these barriers. 4. Patient will identify two changes they are willing to make to overcome these obstacles:    Summary of Patient Progress: pt came to group initially but left after only a few minutes without participating.      Therapeutic Modalities:   Cognitive Behavioral Therapy Solution Focused Therapy Motivational Interviewing Relapse Prevention Therapy  Lurline Idol, LCSW

## 2018-08-16 NOTE — Progress Notes (Signed)
Nursing note 7p-7a  Pt observed in the day room minimal interaction with peers on unit this shift. Displayed a flat  affect and irritable/ agitated mood upon interaction with this Probation officer. Increased irritability and paranoia, pt cursing and yelling in day room.  Pt denies pain ,denies SI/HI, and also denies any audio or visual hallucinations at this time but is clearly responding to internal stimuli.  See MAR for  prn medication administration. Pt educated on falls and encouraged to wear nonskid socks when ambulating in the halls. Pt also encouraged to call for help if feeling weak or dizzy. Pt is now resting in bed with eyes closed, with no signs or symptoms of pain or distress noted. Pt continues to remain safe on the unit and is observed by rounding every 15 min. RN will continue to monitor.
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
## 2018-08-16 NOTE — Tx Team (Addendum)
Interdisciplinary Treatment and Diagnostic Plan Update  08/16/2018 Time of Session: [place] MRN: [id number]  Principal Diagnosis: <principal problem not specified>  Secondary Diagnoses: Active Problems:   Delusional disorder ([place])   Major depressive disorder, recurrent episode, severe, with psychosis ([place])   Current Medications:  Current Facility-Administered Medications  Medication Dose Route Frequency Provider Last Rate Last Dose  . acetaminophen (TYLENOL) tablet 650 mg  650 mg Oral Q4H PRN Sharma Covert, MD      . alum & mag hydroxide-simeth (MAALOX/MYLANTA) 200-200-20 MG/5ML suspension 30 mL  30 mL Oral Q4H PRN Sharma Covert, MD      . benztropine (COGENTIN) tablet 0.5 mg  0.5 mg Oral BID Sharma Covert, MD   0.5 mg at 08/16/18 1036  . fluPHENAZine (PROLIXIN) tablet 5 mg  5 mg Oral BID Sharma Covert, MD   5 mg at 08/16/18 1036  . magnesium hydroxide (MILK OF MAGNESIA) suspension 30 mL  30 mL Oral Daily PRN Sharma Covert, MD      . OLANZapine [facility]) tablet 10 mg  10 mg Oral BID Sharma Covert, MD   10 mg at 08/16/18 1036  . traZODone (DESYREL) tablet 100 mg  100 mg Oral QHS,MR X 1 Sharma Covert, MD   100 mg at 08/15/18 2114  . ziprasidone (GEODON) injection 20 mg  20 mg Intramuscular Q12H PRN Sharma Covert, MD   20 mg at 08/15/18 1152   PTA Medications: Medications Prior to Admission  Medication Sig Dispense Refill Last Dose  . benztropine (COGENTIN) 0.5 MG tablet Take 1 tablet (0.5 mg total) by mouth 2 (two) times daily. 30 tablet 0   . fluPHENAZine (PROLIXIN) 5 MG tablet Take 1 tablet (5 mg total) by mouth 2 (two) times daily. And take 2 tablets (55m total) at bedtime 120 tablet 0   . OLANZapine (ZYPREXA) 10 MG tablet Take 1 tablet (10 mg total) by mouth 2 (two) times daily. 60 tablet 0   . prochlorperazine (COMPAZINE) 10 MG tablet Take 1 tablet (10 mg total) by mouth 2 (two) times daily as needed for nausea or vomiting. 10  tablet 0   . traZODone (DESYREL) 100 MG tablet Take 1 tablet (100 mg total) by mouth at bedtime and may repeat dose one time if needed. 30 tablet 0     Patient Stressors:    Patient Strengths:    Treatment Modalities: Medication Management, Group therapy, Case management,  1 to 1 session with clinician, Psychoeducation, Recreational therapy.   Physician Treatment Plan for Primary Diagnosis: <principal problem not specified> Long Term Goal(s): Improvement in symptoms so as ready for discharge Improvement in symptoms so as ready for discharge   Short Term Goals: Ability to identify changes in lifestyle to reduce recurrence of condition will improve Ability to verbalize feelings will improve Ability to disclose and discuss suicidal ideas Ability to demonstrate self-control will improve Ability to identify and develop effective coping behaviors will improve Ability to maintain clinical measurements within normal limits will improve Compliance with prescribed medications will improve Ability to identify changes in lifestyle to reduce recurrence of condition will improve Ability to verbalize feelings will improve Ability to disclose and discuss suicidal ideas Ability to demonstrate self-control will improve Ability to identify and develop effective coping behaviors will improve Ability to maintain clinical measurements within normal limits will improve Compliance with prescribed medications will improve  Medication Management: Evaluate patient's response, side effects, and tolerance of medication regimen.  Therapeutic  Interventions: 1 to 1 sessions, Unit Group sessions and Medication administration.  Evaluation of Outcomes: Not Met  Physician Treatment Plan for Secondary Diagnosis: Active Problems:   Delusional disorder ([place])   Major depressive disorder, recurrent episode, severe, with psychosis ([place])  Long Term Goal(s): Improvement in symptoms so as ready for  discharge Improvement in symptoms so as ready for discharge   Short Term Goals: Ability to identify changes in lifestyle to reduce recurrence of condition will improve Ability to verbalize feelings will improve Ability to disclose and discuss suicidal ideas Ability to demonstrate self-control will improve Ability to identify and develop effective coping behaviors will improve Ability to maintain clinical measurements within normal limits will improve Compliance with prescribed medications will improve Ability to identify changes in lifestyle to reduce recurrence of condition will improve Ability to verbalize feelings will improve Ability to disclose and discuss suicidal ideas Ability to demonstrate self-control will improve Ability to identify and develop effective coping behaviors will improve Ability to maintain clinical measurements within normal limits will improve Compliance with prescribed medications will improve     Medication Management: Evaluate patient's response, side effects, and tolerance of medication regimen.  Therapeutic Interventions: 1 to 1 sessions, Unit Group sessions and Medication administration.  Evaluation of Outcomes: Not Met   RN Treatment Plan for Primary Diagnosis: <principal problem not specified> Long Term Goal(s): Knowledge of disease and therapeutic regimen to maintain health will improve  Short Term Goals: Ability to identify and develop effective coping behaviors will improve and Compliance with prescribed medications will improve  Medication Management: RN will administer medications as ordered by provider, will assess and evaluate patient's response and provide education to patient for prescribed medication. RN will report any adverse and/or side effects to prescribing provider.  Therapeutic Interventions: 1 on 1 counseling sessions, Psychoeducation, Medication administration, Evaluate responses to treatment, Monitor vital signs and CBGs as  ordered, Perform/monitor CIWA, COWS, AIMS and Fall Risk screenings as ordered, Perform wound care treatments as ordered.  Evaluation of Outcomes: Not Met   LCSW Treatment Plan for Primary Diagnosis: <principal problem not specified> Long Term Goal(s): Safe transition to appropriate next level of care at discharge, Engage patient in therapeutic group addressing interpersonal concerns.  Short Term Goals: Engage patient in aftercare planning with referrals and resources, Increase social support, Facilitate acceptance of mental health diagnosis and concerns and Increase skills for wellness and recovery  Therapeutic Interventions: Assess for all discharge needs, 1 to 1 time with Social worker, Explore available resources and support systems, Assess for adequacy in community support network, Educate family and significant other(s) on suicide prevention, Complete Psychosocial Assessment, Interpersonal group therapy.  Evaluation of Outcomes: Not Met   Progress in Treatment: Attending groups: Yes. Participating in groups: Yes. Taking medication as prescribed: Yes. Toleration medication: Yes. Family/Significant other contact made: No, will contact:  when given permission Patient understands diagnosis: No. Discussing patient identified problems/goals with staff: Yes. Medical problems stabilized or resolved: Yes. Denies suicidal/homicidal ideation: Yes. Issues/concerns per patient self-inventory: No. Other: none  New problem(s) identified: No, Describe:  none  New Short Term/Long Term Goal(s):  Patient Goals:  "I just can't think right now."  Discharge Plan or Barriers:   Reason for Continuation of Hospitalization: Delusions  Medication stabilization  Estimated Length of Stay: 5-7 days.  Attendees: Patient:[name] 08/16/2018   Physician: Dr. Nancy Fetter, MD 08/16/2018   Nursing:  08/16/2018   RN Care Manager: 08/16/2018   Social Worker: Lurline Idol, LCSW 08/16/2018    Recreational  Therapist:  08/16/2018   Other:  08/16/2018   Other:  08/16/2018   Other: 08/16/2018     Scribe for Treatment Team: Joanne Chars, [place] 08/16/2018 11:25 AM
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
## 2018-08-16 NOTE — Plan of Care (Signed)
  Problem: Education: Goal: Knowledge of [facility] Education information/materials will improve Outcome: Progressing Goal: Emotional status will improve Outcome: Progressing Goal: Mental status will improve Outcome: Progressing Goal: Verbalization of understanding the information provided will improve Outcome: Progressing   Problem: Activity: Goal: Interest or engagement in activities will improve Outcome: Progressing Goal: Sleeping patterns will improve Outcome: Progressing   Problem: Coping: Goal: Ability to verbalize frustrations and anger appropriately will improve Outcome: Progressing Goal: Ability to demonstrate self-control will improve Outcome: Progressing   Problem: Health Behavior/Discharge Planning: Goal: Identification of resources available to assist in meeting health care needs will improve Outcome: Progressing Goal: Compliance with treatment plan for underlying cause of condition will improve Outcome: Progressing   Problem: Physical Regulation: Goal: Ability to maintain clinical measurements within normal limits will improve Outcome: Progressing   Problem: Safety: Goal: Periods of time without injury will increase Outcome: Progressing   Problem: Activity: Goal: Will verbalize the importance of balancing activity with adequate rest periods Outcome: Progressing   Problem: Education: Goal: Will be free of psychotic symptoms Outcome: Progressing Goal: Knowledge of the prescribed therapeutic regimen will improve Outcome: Progressing   Problem: Coping: Goal: Coping ability will improve Outcome: Progressing Goal: Will verbalize feelings Outcome: Progressing   Problem: Health Behavior/Discharge Planning: Goal: Compliance with prescribed medication regimen will improve Outcome: Progressing   Problem: Nutritional: Goal: Ability to achieve adequate nutritional intake will improve Outcome: Progressing   Problem: Role Relationship: Goal:  Ability to communicate needs accurately will improve Outcome: Progressing Goal: Ability to interact with others will improve Outcome: Progressing   Problem: Safety: Goal: Ability to redirect hostility and anger into socially appropriate behaviors will improve Outcome: Progressing Goal: Ability to remain free from injury will improve Outcome: Progressing

## 2018-08-16 NOTE — Plan of Care (Signed)
Progress note  D: pt found in bed; compliant with medication administration. Pt declined to fill out her self inventory. Pt did not want to go to lunch but was redirected. "people there know me and I don't want to see them". Pt did not want to get up and take her medications at the med window either. "bring them to me in bed". Pt did eventually come up for medications. Pt provided grooming supplies since she felt disheveled. Pt denies any si/hi/ah/vh and verbally agrees to approach staff if these become apparent or before harming herself while at [facility]. A: pt provided support and encouragement. Pt given medications per protocol and standing orders. Q3m safety checks implemented and continued. R: pt safe on the unit. Will continue to monitor.   Pt progressing in the following metrics  Problem: Education: Goal: Emotional status will improve Outcome: Progressing Goal: Verbalization of understanding the information provided will improve Outcome: Progressing   Problem: Coping: Goal: Ability to verbalize frustrations and anger appropriately will improve Outcome: Progressing Goal: Ability to demonstrate self-control will improve Outcome: Progressing   Problem: Health Behavior/Discharge Planning: Goal: Compliance with treatment plan for underlying cause of condition will improve Outcome: Progressing   Problem: Physical Regulation: Goal: Ability to maintain clinical measurements within normal limits will improve Outcome: Progressing   Problem: Safety: Goal: Periods of time without injury will increase Outcome: Progressing

## 2018-08-17 DIAGNOSIS — R451 Restlessness and agitation: Secondary | ICD-10-CM

## 2018-08-17 NOTE — Progress Notes (Signed)
Refused AM VS. RN will continue to monitor

## 2018-08-17 NOTE — Progress Notes (Signed)
Recreation Therapy Notes  Date: 10.22.19 Time: 1000 Location: 500 Hall Dayroom  Group Topic: Wellness  Goal Area(s) Addresses:  Patient will define components of whole wellness. Patient will verbalize benefit of whole wellness.  Intervention: Music  Activity: Exercise.  LRT lead patients in a series of stretches to get them warmed up.  Each patient got the opportunity to lead the group in an exercise. The group was to complete 30 minutes of exercises to get the blood flowing and heart rate up.  Patients were given a water break half way through the workout but could stop at any point if they needed water or needed a break.  Education: Wellness, Dentist.   Education Outcome: Acknowledges education/In group clarification offered/Needs additional education.   Clinical Observations/Feedback:  Pt did not attend group.     [name], LRT/CTRS         [name] A 08/17/2018 11:44 AM
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
## 2018-08-17 NOTE — B[facility] Group Notes (Signed)
[facility] LCSW Group Therapy Note  Date/Time: 08/17/18, 1100  Type of Therapy/Topic:  Group Therapy:  Feelings about Diagnosis  Participation Level:  Did Not Attend   Mood:   Description of Group:    This group will allow patients to explore their thoughts and feelings about diagnoses they have received. Patients will be guided to explore their level of understanding and acceptance of these diagnoses. Facilitator will encourage patients to process their thoughts and feelings about the reactions of others to their diagnosis, and will guide patients in identifying ways to discuss their diagnosis with significant others in their lives. This group will be process-oriented, with patients participating in exploration of their own experiences as well as giving and receiving support and challenge from other group members.   Therapeutic Goals: 1. Patient will demonstrate understanding of diagnosis as evidence by identifying two or more symptoms of the disorder:  2. Patient will be able to express two feelings regarding the diagnosis 3. Patient will demonstrate ability to communicate their needs through discussion and/or role plays  Summary of Patient Progress:        Therapeutic Modalities:   Cognitive Behavioral Therapy Brief Therapy Feelings Identification   Lurline Idol, LCSW
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
## 2018-08-18 NOTE — Progress Notes (Signed)
Patient ID: [name], female   DOB: 1966/04/09, 52 y.o.   MRN: [id number] PER STATE REGULATIONS 482.30  THIS CHART WAS REVIEWED FOR MEDICAL NECESSITY WITH RESPECT TO THE PATIENT'S ADMISSION/ DURATION OF STAY.  NEXT REVIEW DATE: 08/22/2018  Chauncy Lean, RN, BSN CASE MANAGER
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
## 2018-08-18 NOTE — [facility] Group Notes (Signed)
[facility] Group Notes:  (Nursing/MHT/Case Management/Adjunct)  Date:  08/18/2018  Time:  4:00 PM  Type of Therapy:  Nurse Education  Participation Level:  Did Not Attend  Baron Sane 08/18/2018, 4:39 PM
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
## 2018-08-18 NOTE — Plan of Care (Signed)
Progress Note  D: pt found in bed; compliant with medication administration. Pt states she slept fair. Pt rates her depression/hopelessness/anxiety a 6/0/3 out  of 10 respectively. Pt denies any physical pain rating this a 0/10, but does complain of lightheadedness, dizziness, blurred vision, and headaches. Pt states her goal for today is to get someone to help her and she will do this by going to group. Pt denies any si/hi/ah/vh and verbally agrees to approach staff if these become apparent or before harming herself while at [facility]. A: pt provided support and encouragement. Pt given medications per protocol and standing orders. Q82m safety checks implemented and continued.  R: pt safe on the unit. Will continue to monitor.   Pt progressing in the following metrics  Problem: Coping: Goal: Ability to demonstrate self-control will improve Outcome: Progressing   Problem: Health Behavior/Discharge Planning: Goal: Compliance with treatment plan for underlying cause of condition will improve Outcome: Progressing   Problem: Physical Regulation: Goal: Ability to maintain clinical measurements within normal limits will improve Outcome: Progressing   Problem: Safety: Goal: Periods of time without injury will increase Outcome: Progressing   Problem: Activity: Goal: Will verbalize the importance of balancing activity with adequate rest periods Outcome: Progressing   Problem: Health Behavior/Discharge Planning: Goal: Compliance with prescribed medication regimen will improve Outcome: Progressing

## 2018-08-18 NOTE — Progress Notes (Signed)
Did not attend group 

## 2018-08-18 NOTE — Progress Notes (Signed)
Recreation Therapy Notes  Date: 10.23.19 Time: 1000 Location: 500 Hall Dayroom  Group Topic: Triggers  Goal Area(s) Addresses:  Patient will identify the things that trigger them.  Patient will identify strategies to avoid/reduce exposure to triggers. Patient will identify strategies to deal with each trigger head on.  Intervention: Worksheet, pencils  Activity: Triggers.  LRT introduced triggers to patients.  Patients were to identify their triggers, how they avoid their triggers and how they deal with their triggers head on.  Education: Triggers, Discharge Planning  Education Outcome: Acknowledges understanding/In group clarification offered/Needs additional education.   Clinical Observations/Feedback:  Pt did not attend group.      [name], LRT/CTRS         [name], Kyle Luppino A 08/18/2018 11:59 AM

## 2018-08-18 NOTE — [facility] Suicide Risk Assessment (Signed)
[place] INPATIENT:  Family/Significant Other Suicide Prevention Education  Suicide Prevention Education:  Patient Refusal for Family/Significant Other Suicide Prevention Education: The patient [name] has refused to provide written consent for family/significant other to be provided Family/Significant Other Suicide Prevention Education during admission and/or prior to discharge.  Physician notified.  Joanne Chars, LCSW 08/18/2018, 3:42 PM

## 2018-08-18 NOTE — [facility] Group Notes (Signed)
LCSW Group Therapy Note  08/18/2018 10:04 AM  Type of Therapy/Topic:  Group Therapy:  Emotion Regulation  Participation Level:  Did Not Attend  Lawana Pai MSW Intern 08/18/2018 10:04 AM

## 2018-08-18 NOTE — Progress Notes (Signed)
Adult Psychoeducational Group Note  Date:  08/18/2018 Time:  3:15 AM  Group Topic/Focus:  Wrap-Up Group:   The focus of this group is to help patients review their daily goal of treatment and discuss progress on daily workbooks.  Participation Level:  Did Not Attend  Participation Quality:  Pt did not attend  Affect:  Pt did not attend  Cognitive:  Pt did not attend  Insight: None  Engagement in Group:  Pt did not attend  Modes of Intervention:  Pt did not attend  Additional Comments:  Pt did not attend evening wrap up group tonight.  Candy Sledge 08/18/2018, 3:15 AM

## 2018-08-19 MED ORDER — FLUPHENAZINE HCL 5 MG PO TABS
10.0000 mg | ORAL_TABLET | Freq: Every day | ORAL | Status: DC
  Administered 2018-08-19 – 2018-08-23 (×5): 10 mg via ORAL
  Filled 2018-08-19 (×6): qty 1
  Filled 2018-08-19: qty 14

## 2018-08-19 MED ORDER — OLANZAPINE 10 MG PO TABS
20.0000 mg | ORAL_TABLET | Freq: Every day | ORAL | Status: DC
  Administered 2018-08-20 – 2018-08-23 (×4): 20 mg via ORAL
  Filled 2018-08-19 (×4): qty 2
  Filled 2018-08-19: qty 14
  Filled 2018-08-19: qty 2

## 2018-08-19 MED ORDER — OLANZAPINE 10 MG PO TABS
10.0000 mg | ORAL_TABLET | Freq: Two times a day (BID) | ORAL | Status: AC
  Administered 2018-08-19: 10 mg via ORAL
  Filled 2018-08-19: qty 1

## 2018-08-19 MED ORDER — MICONAZOLE NITRATE 200 MG VA SUPP
200.0000 mg | Freq: Every day | VAGINAL | Status: AC
  Administered 2018-08-19 – 2018-08-21 (×3): 200 mg via VAGINAL
  Filled 2018-08-19: qty 3

## 2018-08-19 NOTE — Progress Notes (Signed)
Pt was observe in the dayroom, seen eating a snack. Pt appears depressed/anxious/preocuppied/paranoid/delusional in affect and mood. Pt denies SI/HI/AVH/Pain at this time. Pt states she hopes to be d/c tomorrow. PRN ativan and vistaril requested and given. Support and encouragement provided. Will continue with POC.

## 2018-08-19 NOTE — Progress Notes (Signed)
Recreation Therapy Notes  Date: 10.24.19 Time: 1000 Location: 500 Hall Dayroom  Group Topic: Leisure Education  Goal Area(s) Addresses:  Patient will identify positive leisure activities.  Patient will identify one positive benefit of participation in leisure activities.   Intervention: Magazines, scissors, Architect paper, glue sticks  Activity: Leisure Collage.  Patients were to create a leisure collage of activities they can do to relax or to engage with family/friends.  Education:  Leisure Education, Dentist  Education Outcome: Acknowledges education/In group clarification offered/Needs additional education  Clinical Observations/Feedback: Pt did not attend group.    [name], LRT/CTRS         [name] A 08/19/2018 12:13 PM
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
## 2018-08-19 NOTE — Plan of Care (Signed)
  Problem: Activity: Goal: Interest or engagement in activities will improve Outcome: Not Progressing   Problem: Safety: Goal: Periods of time without injury will increase Outcome: Progressing  DAR NOTE: Patient presents with anxious affect and depressed mood.  Denies suicidal thoughts, pain, auditory and visual hallucinations.  Rates depression at 0, hopelessness at 0, and anxiety at 0.  Maintained on routine safety checks.  Medications given as prescribed.  Support and encouragement offered as needed.  Patient remained in her room for majority of this shift.  Offered no complaint.

## 2018-08-19 NOTE — Progress Notes (Signed)
Adult Psychoeducational Group Note  Date:  08/19/2018 Time:  8:55 PM  Group Topic/Focus:  Wrap-Up Group:   The focus of this group is to help patients review their daily goal of treatment and discuss progress on daily workbooks.  Participation Level:  Active  Participation Quality:  Appropriate  Affect:  Appropriate  Cognitive:  Alert  Insight: Appropriate  Engagement in Group:  Engaged  Modes of Intervention:  Discussion  Additional Comments:  Patient stated having an okay day. Patient did not have a goal for today.   [name] 08/19/2018, 8:55 PM

## 2018-08-19 NOTE — BHH Group Notes (Signed)
[place] LCSW Group Therapy Note  Date/Time: 08/19/18, 1315  Type of Therapy/Topic:  Group Therapy:  Balance in Life  Participation Level:  Did not attend  Description of Group:    This group will address the concept of balance and how it feels and looks when one is unbalanced. Patients will be encouraged to process areas in their lives that are out of balance, and identify reasons for remaining unbalanced. Facilitators will guide patients utilizing problem- solving interventions to address and correct the stressor making their life unbalanced. Understanding and applying boundaries will be explored and addressed for obtaining  and maintaining a balanced life. Patients will be encouraged to explore ways to assertively make their unbalanced needs known to significant others in their lives, using other group members and facilitator for support and feedback.  Therapeutic Goals: 1. Patient will identify two or more emotions or situations they have that consume much of in their lives. 2. Patient will identify signs/triggers that life has become out of balance:  3. Patient will identify two ways to set boundaries in order to achieve balance in their lives:  4. Patient will demonstrate ability to communicate their needs through discussion and/or role plays  Summary of Patient Progress:          Therapeutic Modalities:   Cognitive Behavioral Therapy Solution-Focused Therapy Assertiveness Training  Lurline Idol, LCSW

## 2018-08-20 DIAGNOSIS — F333 Major depressive disorder, recurrent, severe with psychotic symptoms: Secondary | ICD-10-CM

## 2018-08-20 MED ORDER — FLUPHENAZINE DECANOATE 25 MG/ML IJ SOLN
25.0000 mg | INTRAMUSCULAR | Status: DC
  Administered 2018-08-20: 25 mg via INTRAMUSCULAR
  Filled 2018-08-20: qty 1

## 2018-08-20 NOTE — Progress Notes (Signed)
Recreation Therapy Notes  Date: 10.25.19 Time: 1000 Location: 500 Hall Dayroom  Group Topic: Communication, Team Building, Problem Solving  Goal Area(s) Addresses:  Patient will effectively work with peer towards shared goal.  Patient will identify skill used to make activity successful.  Patient will identify how skills used during activity can be used to reach post d/c goals.   Intervention: STEM Activity   Activity: In team's, using 10 red plastic cups, patients were asked to build a pyramid.    Education: Education officer, community, Dentist.   Education Outcome: Acknowledges education/In group clarification offered/Needs additional education.   Clinical Observations/Feedback: Pt did not attend group.    [name], LRT/CTRS         [name], Kyra Laffey A 08/20/2018 11:30 AM

## 2018-08-20 NOTE — Tx Team (Signed)
Interdisciplinary Treatment and Diagnostic Plan Update  08/20/2018 Time of Session: 0840 [name] MRN: [id number]  Principal Diagnosis: Schizophrenia [facility])  Secondary Diagnoses: Principal Problem:   Schizophrenia ([place])   Current Medications:  Current Facility-Administered Medications  Medication Dose Route Frequency Provider Last Rate Last Dose  . acetaminophen (TYLENOL) tablet 650 mg  650 mg Oral Q4H PRN Sharma Covert, MD      . alum & mag hydroxide-simeth (MAALOX/MYLANTA) 200-200-20 MG/5ML suspension 30 mL  30 mL Oral Q4H PRN Sharma Covert, MD      . benztropine (COGENTIN) tablet 0.5 mg  0.5 mg Oral BID Sharma Covert, MD   0.5 mg at 08/20/18 0909  . fluPHENAZine (PROLIXIN) tablet 10 mg  10 mg Oral QHS Pennelope Bracken, MD   10 mg at 08/19/18 2044  . hydrOXYzine (ATARAX/VISTARIL) tablet 50 mg  50 mg Oral Q6H PRN Pennelope Bracken, MD   50 mg at 08/19/18 2045  . LORazepam (ATIVAN) tablet 2 mg  2 mg Oral Q6H PRN Pennelope Bracken, MD   2 mg at 08/19/18 2044   Or  . LORazepam (ATIVAN) injection 2 mg  2 mg Intramuscular Q6H PRN Pennelope Bracken, MD      . magnesium hydroxide (MILK OF MAGNESIA) suspension 30 mL  30 mL Oral Daily PRN Sharma Covert, MD      . miconazole (MICOTIN) vaginal suppository 200 mg  200 mg Vaginal QHS Pennelope Bracken, MD   200 mg at 08/19/18 2047  . OLANZapine (ZYPREXA) tablet 20 mg  20 mg Oral QHS Pennelope Bracken, MD      . traZODone (DESYREL) tablet 100 mg  100 mg Oral QHS,MR X 1 Sharma Covert, MD   100 mg at 08/19/18 2044  . ziprasidone (GEODON) injection 20 mg  20 mg Intramuscular Q12H PRN Sharma Covert, MD   20 mg at 08/15/18 1152   PTA Medications: Medications Prior to Admission  Medication Sig Dispense Refill Last Dose  . benztropine (COGENTIN) 0.5 MG tablet Take 1 tablet (0.5 mg total) by mouth 2 (two) times daily. 30 tablet 0   . fluPHENAZine (PROLIXIN) 5 MG tablet Take 1  tablet (5 mg total) by mouth 2 (two) times daily. And take 2 tablets (10mg  total) at bedtime 120 tablet 0   . OLANZapine (ZYPREXA) 10 MG tablet Take 1 tablet (10 mg total) by mouth 2 (two) times daily. 60 tablet 0   . prochlorperazine (COMPAZINE) 10 MG tablet Take 1 tablet (10 mg total) by mouth 2 (two) times daily as needed for nausea or vomiting. 10 tablet 0   . traZODone (DESYREL) 100 MG tablet Take 1 tablet (100 mg total) by mouth at bedtime and may repeat dose one time if needed. 30 tablet 0     Patient Stressors:    Patient Strengths:    Treatment Modalities: Medication Management, Group therapy, Case management,  1 to 1 session with clinician, Psychoeducation, Recreational therapy.   Physician Treatment Plan for Primary Diagnosis: Schizophrenia ([place]) Long Term Goal(s): Improvement in symptoms so as ready for discharge Improvement in symptoms so as ready for discharge   Short Term Goals: Ability to identify changes in lifestyle to reduce recurrence of condition will improve Ability to verbalize feelings will improve Ability to disclose and discuss suicidal ideas Ability to demonstrate self-control will improve Ability to identify and develop effective coping behaviors will improve Ability to maintain clinical measurements within normal limits will improve Compliance with  prescribed medications will improve Ability to identify changes in lifestyle to reduce recurrence of condition will improve Ability to verbalize feelings will improve Ability to disclose and discuss suicidal ideas Ability to demonstrate self-control will improve Ability to identify and develop effective coping behaviors will improve Ability to maintain clinical measurements within normal limits will improve Compliance with prescribed medications will improve  Medication Management: Evaluate patient's response, side effects, and tolerance of medication regimen.  Therapeutic Interventions: 1 to 1 sessions, Unit  Group sessions and Medication administration.  Evaluation of Outcomes: Not Progressing  Physician Treatment Plan for Secondary Diagnosis: Principal Problem:   Schizophrenia ([place])  Long Term Goal(s): Improvement in symptoms so as ready for discharge Improvement in symptoms so as ready for discharge   Short Term Goals: Ability to identify changes in lifestyle to reduce recurrence of condition will improve Ability to verbalize feelings will improve Ability to disclose and discuss suicidal ideas Ability to demonstrate self-control will improve Ability to identify and develop effective coping behaviors will improve Ability to maintain clinical measurements within normal limits will improve Compliance with prescribed medications will improve Ability to identify changes in lifestyle to reduce recurrence of condition will improve Ability to verbalize feelings will improve Ability to disclose and discuss suicidal ideas Ability to demonstrate self-control will improve Ability to identify and develop effective coping behaviors will improve Ability to maintain clinical measurements within normal limits will improve Compliance with prescribed medications will improve     Medication Management: Evaluate patient's response, side effects, and tolerance of medication regimen.  Therapeutic Interventions: 1 to 1 sessions, Unit Group sessions and Medication administration.  Evaluation of Outcomes: Not Progressing   RN Treatment Plan for Primary Diagnosis: Schizophrenia ([place]) Long Term Goal(s): Knowledge of disease and therapeutic regimen to maintain health will improve  Short Term Goals: Ability to identify and develop effective coping behaviors will improve and Compliance with prescribed medications will improve  Medication Management: RN will administer medications as ordered by provider, will assess and evaluate patient's response and provide education to patient for prescribed medication. RN  will report any adverse and/or side effects to prescribing provider.  Therapeutic Interventions: 1 on 1 counseling sessions, Psychoeducation, Medication administration, Evaluate responses to treatment, Monitor vital signs and CBGs as ordered, Perform/monitor CIWA, COWS, AIMS and Fall Risk screenings as ordered, Perform wound care treatments as ordered.  Evaluation of Outcomes: Progressing   LCSW Treatment Plan for Primary Diagnosis: Schizophrenia ([place]) Long Term Goal(s): Safe transition to appropriate next level of care at discharge, Engage patient in therapeutic group addressing interpersonal concerns.  Short Term Goals: Engage patient in aftercare planning with referrals and resources, Increase social support, Facilitate acceptance of mental health diagnosis and concerns and Increase skills for wellness and recovery  Therapeutic Interventions: Assess for all discharge needs, 1 to 1 time with Social worker, Explore available resources and support systems, Assess for adequacy in community support network, Educate family and significant other(s) on suicide prevention, Complete Psychosocial Assessment, Interpersonal group therapy.  Evaluation of Outcomes: Progressing   Progress in Treatment: Attending groups: No Participating in groups: No Taking medication as prescribed: Yes. Toleration medication: Yes. Family/Significant other contact made: No, will contact:  when given permission Patient understands diagnosis: No. Discussing patient identified problems/goals with staff: Yes. Medical problems stabilized or resolved: Yes. Denies suicidal/homicidal ideation: Yes. Issues/concerns per patient self-inventory: No. Other: none  New problem(s) identified: No, Describe:  none  New Short Term/Long Term Goal(s):  Patient Goals:  "I just can't think right  now."  Discharge Plan or Barriers:   Reason for Continuation of Hospitalization: Delusions  Medication stabilization  Estimated Length  of Stay: 5-7 days.  Attendees: Patient: 08/20/2018   Physician: Dr. Nancy Fetter, MD 08/20/2018   Nursing: Sena Hitch, RN 08/20/2018   RN Care Manager: 08/20/2018   Social Worker: Lurline Idol, LCSW 08/20/2018   Recreational Therapist:  08/20/2018   Other:  08/20/2018   Other:  08/20/2018   Other: 08/20/2018        Scribe for Treatment Team: Joanne Chars, [place] 08/20/2018 11:44 AM
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
## 2018-08-20 NOTE — Progress Notes (Signed)
Patient did not attend wrap up group. 

## 2018-08-20 NOTE — Plan of Care (Signed)
  Problem: Activity: Goal: Interest or engagement in activities will improve Outcome: Progressing   Problem: Safety: Goal: Periods of time without injury will increase Outcome: Progressing  DAR NOTE: Patient presents with anxious affect and depressed mood.  Denies suicidal thoughts, pain, auditory and visual hallucinations.  Rates depression at 0, hopelessness at 0, and anxiety at 0.  Maintained on routine safety checks.  Medications given as prescribed.  Support and encouragement offered as needed.  Attended group and participated.  States goal for today is "discharge."  Patient preoccupied and insisting on getting discharge today.  Received Prolixin injection, left deltoid.  No adverse reaction noted. Offered no complaint.
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
## 2018-08-21 NOTE — Progress Notes (Signed)
D:  Pria was in the day room much of the evening.  Minimal interaction with staff or peers.  She denied any SI/HI or A/V hallucinations.  She denied any pain or discomfort and appeared to be in no physical distress.  She did voice that she thought she is eating too much here but then requested extra snack.  She took her hs medication without difficulty.  She is currently resting with her eyes closed and is snoring.   A:  1:1 with RN for support and encouragement.  Medications as ordered.  Q 15 minute checks maintained for safety.  Encouraged participation in group and unit activities.   R:  Ala remains safe on the unit.  We will continue to monitor the progress towards her goals.

## 2018-08-21 NOTE — [facility] Group Notes (Signed)
  [facility] LCSW Group Therapy Note  Date/Time:  08/21/2018 11:15AM-12:00PM  Type of Therapy and Topic:  Group Therapy:  Feelings About Hospitalization  Participation Level:  Active   Description of Group This process group involved patients discussing their feelings related to being hospitalized, as well as the benefits they see to being in the hospital.  These feelings and benefits were itemized.  The group then brainstormed specific ways in which they could seek those same benefits when they discharge and return home.  Therapeutic Goals 1. Patient will identify and describe positive and negative feelings related to hospitalization 2. Patient will verbalize benefits of hospitalization to themselves personally 3. Patients will brainstorm together ways they can obtain similar benefits in the outpatient setting, identify barriers to wellness and possible solutions  Summary of Patient Progress:  The patient expressed her primary feelings about being hospitalized are trapped and hopeless, feeling that the RNs do not care, and feeling she is being laughed at.  She talked several times at length about her paranoid delusions about being cyberstalked, and was thanked each time for contributing to the discussion.  She stated she has been hospitalized over 50 times since 2006.  She talked at length about having 2 master's degrees and how she feels that nobody listens to her or pays attention to her despite the fact that this is her field.  CSW listened actively both during group and afterward and that seemed to be what she needed, as she said she would likely return to group again because she felt she had been heard.  Therapeutic Modalities Cognitive Behavioral Therapy Motivational Interviewing    Selmer Dominion, LCSW 08/21/2018, 1:06 PM
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
## 2018-08-21 NOTE — Plan of Care (Signed)
  Problem: Activity: Goal: Interest or engagement in activities will improve Outcome: Not Progressing Note:  She continues to not come to groups or participate in activities.  We will continue to encourage participation.

## 2018-08-22 NOTE — [facility] Group Notes (Signed)
[facility] Group Notes:  (Nursing/MHT/Case Management/Adjunct)  Date:  08/22/2018  Time:  9:00 - 10:00 am  Type of Therapy:  Psychoeducational Skills and Goals Group  Participation Level:  Did Not Attend  Participation Quality:    Affect:    Cognitive:    Insight:    Engagement in Group:    Modes of Intervention:    Summary of Progress/Problems:  Cammy Copa 08/22/2018, 10:44 AM

## 2018-08-22 NOTE — Plan of Care (Signed)
  Problem: Activity: Goal: Sleeping patterns will improve Outcome: Progressing Note:  [name] was able to fall asleep this evening with only one dose of trazodone.  We will continue to monitor the progress towards her goals.
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
## 2018-08-22 NOTE — Progress Notes (Signed)
Patient ID: [name], female   DOB: 05-13-1966, 52 y.o.   MRN: [id number] PER STATE REGULATIONS 482.30  THIS CHART WAS REVIEWED FOR MEDICAL NECESSITY WITH RESPECT TO THE PATIENT'S ADMISSION/DURATION OF STAY.  NEXT REVIEW DATE:08/26/18 Roma Schanz, RN, BSN CASE MANAGER

## 2018-08-22 NOTE — BHH Group Notes (Signed)
[place] LCSW Group Therapy Note  Date/Time:  08/22/2018  11:00AM-12:00PM  Type of Therapy and Topic:  Group Therapy:  Music and Mood  Participation Level:  Did Not Attend   Description of Group: In this process group, members listened to a variety of genres of music and identified that different types of music evoke different responses.  Patients were encouraged to identify music that was soothing for them and music that was energizing for them.  Patients discussed how this knowledge can help with wellness and recovery in various ways including managing depression and anxiety as well as encouraging healthy sleep habits.    Therapeutic Goals: 1. Patients will explore the impact of different varieties of music on mood 2. Patients will verbalize the thoughts they have when listening to different types of music 3. Patients will identify music that is soothing to them as well as music that is energizing to them 4. Patients will discuss how to use this knowledge to assist in maintaining wellness and recovery 5. Patients will explore the use of music as a coping skill  Summary of Patient Progress:  Patient entered the room several times, but immediately left.  Therapeutic Modalities: Solution Focused Brief Therapy Activity   Selmer Dominion, LCSW

## 2018-08-22 NOTE — Progress Notes (Signed)
D. Pt presents with an anxious affect / depressed mood- isolative to room for much of the morning. Pt is calm and cooperative, pleasant during interactions, but continues to have paranoid, persecutory delusions about being harassed and having a 'cyber chip' implanted in her. Pt currently denies SI/HI. Per pt's self inventory, pt rates her depression, hopelessness and anxiety a 1/3/1, respectively. Pt writes that her goal today is "try to attend more groups".A. Labs and vitals monitored. Pt compliant with medications. Pt supported emotionally and encouraged to express concerns and ask questions.   R. Pt remains safe with 15 minute checks. Will continue POC.

## 2018-08-22 NOTE — BHH Group Notes (Signed)
[facility] Group Notes:  (Nursing)  Date:  08/22/2018  Time: 400 PM Type of Therapy:  Nurse Education  Participation Level:  Did Not Attend  Waymond Cera 08/22/2018, 6:09 PM

## 2018-08-22 NOTE — Plan of Care (Signed)
  Problem: Education: Goal: Knowledge of [facility] Education information/materials will improve Outcome: Progressing   Problem: Activity: Goal: Interest or engagement in activities will improve Outcome: Progressing   Problem: Safety: Goal: Periods of time without injury will increase Outcome: Progressing   Patient oriented to the unit. Patient intermittently will be found in the dayroom. Patient remains safe and will continue to monitor.

## 2018-08-23 NOTE — Plan of Care (Signed)
  Problem: Education: Goal: Verbalization of understanding the information provided will improve Outcome: Progressing   Problem: Activity: Goal: Interest or engagement in activities will improve Outcome: Progressing   Problem: Coping: Goal: Ability to demonstrate self-control will improve Outcome: Progressing   D: Pt alert and oriented on the unit. Pt denies SI/HI, A/VH, or any pain. Pt also participated during unit groups and activities. Pt cooperative and medication compliant. A: Education, support and encouragement provided, q15 minute safety checks remain in effect. Medications administered per MD orders. R: No reactions/side effects to medicine noted. Pt denies any concerns at this time, and verbally contracts for safety. Pt ambulating on the unit with no issues. Pt remains safe on and off the unit.
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
## 2018-08-23 NOTE — Progress Notes (Addendum)
D: Patient is in dayroom upon approach. Patient is alert and cooperative. Patient presents with anxious facial expression and preoccupied affect. Patient denies SI, HI, AVH, and verbally contracts for safety. Patient denies physical symptoms/pain. Patient reports "slept most of the day but felt like I needed it". Patient reports she has "an apartment in [place]" when discussing living arrangements with this RN.   A: Scheduled medications administered per MD order. Support provided. Patient educated on safety on the unit and medications. Routine safety checks every 15 minutes. Patient stated understanding to tell nurse about any new physical symptoms. Patient understands to tell staff of any needs.     R: No adverse drug reactions noted. Patient verbally contracts for safety. Patient remains safe at this time and will continue to monitor.
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
## 2018-08-23 NOTE — Progress Notes (Signed)
Recreation Therapy Notes  Date: 10.28.19 Time: 1000 Location: 500 Hall Dayroom  Group Topic:  Goal Setting  Goal Area(s) Addresses:  Patient will be able to identify at least 3 life goals.   Patient will be able to identify benefit of investing in life goals.  Patient will be able to identify benefit of setting life goals.   Behavioral Response:  None  Intervention: Worksheet, pencils  Activity: Life Goals.  Patients were to identify goals they wanted to accomplish in a wee, month, year and five years.  Patients were to then identify the obstacles they would face, what they needed in order to be successful and what they could start doing to work towards their goals.  Education:  Discharge Planning, Coping Skills, Life Goals   Education Outcome: Acknowledges Education/In Group Clarification Provided/Needs Additional Education  Clinical Observations: Pt attended but did not participate.    [name], LRT/CTRS         [name], Llewellyn Schoenberger A 08/23/2018 10:38 AM

## 2018-08-23 NOTE — Progress Notes (Signed)
Nursing Progress Note: 7p-7a D: Pt currently presents with a pleasant/preoccupied/paranoid affect and behavior. Pt states "I had a great day. Groups are really helping me." Interacting appropriately with the milieu. Pt reports good sleep during the previous night with current medication regimen. Pt did attend wrap-up group.  A: Pt provided with medications per providers orders. Pt's labs and vitals were monitored throughout the night. Pt supported emotionally and encouraged to express concerns and questions. Pt educated on medications.  R: Pt's safety ensured with 15 minute and environmental checks. Pt currently denies SI, HI, and AVH. Pt verbally contracts to seek staff if SI,HI, or AVH occurs and to consult with staff before acting on any harmful thoughts. Will continue to monitor.

## 2018-08-24 ENCOUNTER — Encounter (HOSPITAL_COMMUNITY): Payer: Self-pay | Admitting: *Deleted

## 2018-08-24 MED ORDER — OLANZAPINE 20 MG PO TABS: 20.0000 mg | ORAL_TABLET | Freq: Every day | ORAL | 0 refills | Status: DC

## 2018-08-24 MED ORDER — TRAZODONE HCL 100 MG PO TABS: 100.0000 mg | ORAL_TABLET | Freq: Every evening | ORAL | 0 refills | Status: DC | PRN

## 2018-08-24 MED ORDER — BENZTROPINE MESYLATE 0.5 MG PO TABS: 0.5000 mg | ORAL_TABLET | Freq: Two times a day (BID) | ORAL | 1 refills | Status: DC

## 2018-08-24 MED ORDER — TRAZODONE HCL 100 MG PO TABS
100.0000 mg | ORAL_TABLET | Freq: Every evening | ORAL | Status: DC | PRN
  Filled 2018-08-24: qty 1

## 2018-08-24 MED ORDER — FLUPHENAZINE HCL 10 MG PO TABS: 10.0000 mg | ORAL_TABLET | Freq: Every day | ORAL | 0 refills | Status: DC

## 2018-08-24 MED ORDER — FLUPHENAZINE DECANOATE 25 MG/ML IJ SOLN: 25.0000 mg | INTRAMUSCULAR | 0 refills | Status: DC

## 2018-08-24 NOTE — [facility] Group Notes (Signed)
[place] LCSW Group Therapy Note  Date/Time: 08/24/18, 1100  Type of Therapy/Topic:  Group Therapy:  Feelings about Diagnosis  Participation Level:  None   Mood:   Description of Group:    This group will allow patients to explore their thoughts and feelings about diagnoses they have received. Patients will be guided to explore their level of understanding and acceptance of these diagnoses. Facilitator will encourage patients to process their thoughts and feelings about the reactions of others to their diagnosis, and will guide patients in identifying ways to discuss their diagnosis with significant others in their lives. This group will be process-oriented, with patients participating in exploration of their own experiences as well as giving and receiving support and challenge from other group members.   Therapeutic Goals: 1. Patient will demonstrate understanding of diagnosis as evidence by identifying two or more symptoms of the disorder:  2. Patient will be able to express two feelings regarding the diagnosis 3. Patient will demonstrate ability to communicate their needs through discussion and/or role plays  Summary of Patient Progress:Pt came in to group, sat down for one minute, got up and left.        Therapeutic Modalities:   Cognitive Behavioral Therapy Brief Therapy Feelings Identification   Lurline Idol, LCSW

## 2018-08-24 NOTE — Progress Notes (Signed)
Recreation Therapy Notes  INPATIENT RECREATION TR PLAN  Patient Details Name: [name] MRN: [id number] DOB: 04-28-1966 Today's Date: 08/24/2018  Rec Therapy Plan Is patient appropriate for Therapeutic Recreation?: Yes Treatment times per week: about 3 days Estimated Length of Stay: 5-7 days TR Treatment/Interventions: Group participation (Comment)  Discharge Criteria Pt will be discharged from therapy if:: Discharged Treatment plan/goals/alternatives discussed and agreed upon by:: Patient/family  Discharge Summary Short term goals set: See patient care plan Short term goals met: Not met Progress toward goals comments: Groups attended Which groups?: Goal setting Reason goals not met: Pt attended one group and did not participate. Therapeutic equipment acquired: N/A Reason patient discharged from therapy: Treatment goals met Pt/family agrees with progress & goals achieved: Yes Date patient discharged from therapy: 08/24/18      [name], LRT/CTRS  [name], Jakeria Caissie A 08/24/2018, 12:11 PM

## 2018-08-24 NOTE — [facility] Suicide Risk Assessment (Addendum)
[facility] Discharge Suicide Risk Assessment   Principal Problem: Schizophrenia [facility] Discharge Diagnoses:  Patient Active Problem List   Diagnosis Date Noted  . Major depressive disorder, recurrent episode, severe, with psychosis ([place]) [F33.3]   . Schizophrenia ([place]) [F20.9] 03/26/2018  . Delusional disorder ([place]) [F22] 01/07/2018  . Anxiety [F41.9] 03/29/2016  . Homelessness [Z59.0] 11/18/2015  . Tobacco use disorder [F17.200] 07/05/2015  . Hypokalemia [E87.6] 05/30/2015  . Obstipation [K59.00] 05/30/2015  . Prolonged QT interval [R94.31] 05/30/2015  . Hypertension [I10] 05/30/2015  . Suicidal ideations [R45.851] 03/12/2015  . Diabetes ([place]) [E11.9] 09/06/2012  . Paranoia ([place]) [F22] 04/16/2012    Total Time spent with patient: 30 minutes  Musculoskeletal: Strength & Muscle Tone: within normal limits Gait & Station: normal Patient leans: N/A  Psychiatric Specialty Exam:   Blood pressure (!) 83/72, pulse (!) 109, temperature 98 F (36.7 C), temperature source Oral, resp. rate 18, height 5\' 5"  (1.651 m), weight 119.3 kg, last menstrual period 10/29/2011, SpO2 100 %.Body mass index is 43.77 kg/m.   Mental Status Per Nursing Assessment::   On Admission:  NA  Demographic Factors:  Low socioeconomic status, Living alone and Unemployed  Loss Factors: Decrease in vocational status, Legal issues and Financial problems/change in socioeconomic status  Historical Factors: Impulsivity  Risk Reduction Factors:   Positive social support, Positive therapeutic relationship and Positive coping skills or problem solving skills  Continued Clinical Symptoms:  Schizophrenia:   Paranoid or undifferentiated type  Cognitive Features That Contribute To Risk:  None    Suicide Risk:  Minimal: No identifiable suicidal ideation.  Patients presenting with no risk factors but with morbid ruminations; may be classified as minimal risk based on the severity of the depressive symptoms    Plan Of  Care/Follow-up recommendations:  Activity:  as tolerated Diet:  normal Tests:  NA Other:  see above for DC plan  Pennelope Bracken, MD 08/24/2018, 12:02 PM

## 2018-08-24 NOTE — Plan of Care (Signed)
Pt attended one group session and did not participate.   [name], LRT/CTRS

## 2018-08-24 NOTE — Progress Notes (Signed)
Nursing discharge note: Patient discharged home per MD order.  Patient received all personal belongings from unit and locker. Patient left with prescriptions and samples of her medications.  Reviewed AVS/transition record with patient and she indicates understanding.  Patient will follow up with Dr. Altamese [place].  Patient denies any thoughts of self harm.  She left ambulatory with two bus passes.  She states she has the address where her car is currently parked.

## 2018-08-24 NOTE — Progress Notes (Signed)
CSW spoke with [name] at [facility] who reports that they do not have any available I[facility] ACT team slots but they can accept pt into a CST slot if CSW will schedule med mgmt appt.  CSW agreed to this, sent updated note recommending CST to [facility], and scheduled appt with Dr Altamese .   [name], MSW, LCSW Clinical Social Worker 08/24/2018 2:05 PM
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
## 2018-08-24 NOTE — Progress Notes (Signed)
Recreation Therapy Notes  Date: 10.29.19 Time: 1000 Location: [address] Dayroom  Group Topic: Leisure Education  Goal Area(s) Addresses:  Patient will identify positive leisure activities.  Patient will identify one positive benefit of participation in leisure activities.   Intervention: [employer], dry erase marker, eraser, various leisure activities  Activity: [place]. Patients were given the opportunity to pull a leisure activity from the container.  Patients were to then draw the activity on the board.  The remaining patients were to guess what the activity was.  The person that correctly guesses the activity gets the next turn.   Education:  Leisure Education, Dentist  Education Outcome: Acknowledges education/In group clarification offered/Needs additional education  Clinical Observations/Feedback:  Pt did not attend group.    [name], LRT/CTRS         [name] A 08/24/2018 12:37 PM

## 2018-08-24 NOTE — Plan of Care (Signed)
  Problem: Education: Goal: Knowledge of [facility] Education information/materials will improve Outcome: Completed/Met Goal: Emotional status will improve Outcome: Completed/Met Goal: Mental status will improve Outcome: Completed/Met Goal: Verbalization of understanding the information provided will improve Outcome: Completed/Met   Problem: Activity: Goal: Interest or engagement in activities will improve Outcome: Completed/Met Goal: Sleeping patterns will improve Outcome: Completed/Met   Problem: Coping: Goal: Ability to verbalize frustrations and anger appropriately will improve Outcome: Completed/Met Goal: Ability to demonstrate self-control will improve Outcome: Completed/Met   Problem: Health Behavior/Discharge Planning: Goal: Identification of resources available to assist in meeting health care needs will improve Outcome: Completed/Met Goal: Compliance with treatment plan for underlying cause of condition will improve Outcome: Completed/Met   Problem: Physical Regulation: Goal: Ability to maintain clinical measurements within normal limits will improve Outcome: Completed/Met   Problem: Safety: Goal: Periods of time without injury will increase Outcome: Completed/Met   Problem: Activity: Goal: Will verbalize the importance of balancing activity with adequate rest periods Outcome: Completed/Met   Problem: Education: Goal: Will be free of psychotic symptoms Outcome: Completed/Met Goal: Knowledge of the prescribed therapeutic regimen will improve Outcome: Completed/Met   Problem: Coping: Goal: Coping ability will improve Outcome: Completed/Met Goal: Will verbalize feelings Outcome: Completed/Met   Problem: Health Behavior/Discharge Planning: Goal: Compliance with prescribed medication regimen will improve Outcome: Completed/Met   Problem: Nutritional: Goal: Ability to achieve adequate nutritional intake will improve Outcome: Completed/Met    Problem: Role Relationship: Goal: Ability to communicate needs accurately will improve Outcome: Completed/Met Goal: Ability to interact with others will improve Outcome: Completed/Met   Problem: Safety: Goal: Ability to redirect hostility and anger into socially appropriate behaviors will improve Outcome: Completed/Met Goal: Ability to remain free from injury will improve Outcome: Completed/Met

## 2018-08-24 NOTE — Progress Notes (Signed)
  [facility] Adult Case Management Discharge Plan :  Will you be returning to the same living situation after discharge:  Yes,  own home At discharge, do you have transportation home?: No. Bus pass provided.  Do you have the ability to pay for your medications: Yes,  medicare  Release of information consent forms completed and in the chart;  Patient's signature needed at discharge.  Patient to Follow up at: Follow-up Information    [facility]. Go on 08/30/2018.   Why:  Please attend your medication appt with Dr Sanjuana Letters on Monday, 08/30/18, at 1:45pm. Contact information: [address], [place], [place] [address] P: [phone] F: [place] Follow up on 08/26/2018.   Why:  The Community Support team will meet you at your house on Thursday, 08/26/18, at 1:00pm for an intake session. Contact information: [place] [address] [phone]           Next level of care provider has access to [place] and Suicide Prevention discussed: No. Pt declined consent.     Has patient been referred to the Quitline?: N/A patient is not a smoker  Patient has been referred for addiction treatment: N/A  Joanne Chars, LCSW 08/24/2018, 2:02 PM

## 2018-09-10 ENCOUNTER — Telehealth: Payer: Self-pay | Admitting: Neurology

## 2018-09-10 NOTE — Telephone Encounter (Signed)
Returned call to pt.  No answer.  LMOM asking for return call.  

## 2018-09-10 NOTE — Telephone Encounter (Signed)
Patient called and left a vm stating she was unable to complete the open MRI recently and she didn't know if there was anything else that she could do for testing. Please call her back with the next steps at [phone]. Thanks!

## 2018-09-15 ENCOUNTER — Telehealth: Payer: Self-pay

## 2018-09-15 ENCOUNTER — Telehealth: Payer: Self-pay | Admitting: Neurology

## 2018-09-15 NOTE — Telephone Encounter (Signed)
Returned call.  Spoke with Amy.

## 2018-09-15 NOTE — Telephone Encounter (Signed)
**  LATE DOCUMENTATION**  Pt came into the office on November 7 to let me know that [facility] had not received referral for MRI.  Advised that I need to send copy of insurance card with referral.  Pt left and came back with paperwork from [facility] with insurance information.  Referral and letter faxed to [facility].

## 2018-09-15 NOTE — Telephone Encounter (Signed)
Amy called from [facility] and would like to speak with you regarding this patient. Thanks

## 2018-10-07 ENCOUNTER — Telehealth: Payer: Self-pay

## 2018-10-07 NOTE — Telephone Encounter (Signed)
Received call from pt.  She states that she is dizzy and light-headed to "her souls core of her being".  Pt could not give anymore detail in regards to dizziness/light-headedness.  Pt did states that she was unable to have open MRI that Dr. Delice Lesch ordered.  When I asked why she was unable to have open MRI pt stated "i'm pre-cardiac and pre-stroke.  I've been through too much.  They put that thing over my face and I just could not go in the machine."  I advised that pt should have imaging done as this will help guide Dr. Delice Lesch to a proper Dx.  Pt stated "Well, there has to be something someone can do NOW for this"  I advised pt that if it is as bad as she says, she should make her way to the nearest Urgent Care or Emergency Room facility.  Pt verbalized understanding and thanked me for my advise.

## 2018-10-26 ENCOUNTER — Ambulatory Visit (HOSPITAL_COMMUNITY)
Admission: EM | Admit: 2018-10-26 | Discharge: 2018-10-26 | Disposition: A | Discharge status: Home / Self Care | Payer: Medicare Other

## 2018-10-27 ENCOUNTER — Encounter (HOSPITAL_COMMUNITY): Payer: Self-pay

## 2018-10-27 ENCOUNTER — Ambulatory Visit (HOSPITAL_COMMUNITY)
Admission: EM | Admit: 2018-10-27 | Discharge: 2018-10-27 | Disposition: A | Discharge status: Home / Self Care | Payer: Medicare Other | Attending: Family Medicine | Admitting: Family Medicine

## 2018-10-27 ENCOUNTER — Ambulatory Visit (INDEPENDENT_AMBULATORY_CARE_PROVIDER_SITE_OTHER): Payer: Medicare Other

## 2018-10-27 DIAGNOSIS — J209 Acute bronchitis, unspecified: Secondary | ICD-10-CM

## 2018-10-27 DIAGNOSIS — R059 Cough, unspecified: Secondary | ICD-10-CM

## 2018-10-27 DIAGNOSIS — R062 Wheezing: Secondary | ICD-10-CM | POA: Diagnosis not present

## 2018-10-27 DIAGNOSIS — R05 Cough: Secondary | ICD-10-CM

## 2018-10-27 DIAGNOSIS — R0602 Shortness of breath: Secondary | ICD-10-CM | POA: Diagnosis not present

## 2018-10-27 MED ORDER — BENZONATATE 100 MG PO CAPS: 100.0000 mg | ORAL_CAPSULE | Freq: Three times a day (TID) | ORAL | 0 refills | Status: DC

## 2018-10-27 MED ORDER — PREDNISONE 20 MG PO TABS: 20.0000 mg | ORAL_TABLET | Freq: Two times a day (BID) | ORAL | 0 refills | Status: AC

## 2018-10-27 NOTE — Discharge Instructions (Signed)
X-rays did not show signs of cardiopulmonary disease Will treat you today for potential bronchitis Get plenty of rest and push fluids Prednisone prescribed.  Take as directed and to completion Tessalon Perles prescribed for cough Use OTC medications like ibuprofen or tylenol as needed fever or pain Follow up with PCP or community Health if symptoms persist Return or go to ER if you have any new or worsening symptoms fever, chills, nausea, vomiting, chest pain, cough, shortness of breath, wheezing, abdominal pain, changes in bowel or bladder habits, etc..Marland Kitchen

## 2018-10-27 NOTE — ED Triage Notes (Signed)
Pt presents with persistent deep cough, chest and facial congestion.  Pt complains upper respiratory symptoms are causing dizziness and she is not getting any relief with OTC medication.

## 2018-10-27 NOTE — ED Provider Notes (Signed)
[place]   [id number] 10/27/18 Arrival Time: 4098   CC: cough  SUBJECTIVE: History from: patient.  [name] is a 53 y.o. female who presents with abrupt onset of cough, body aches, wheezing and SOB x 3-4 days.  Denies positive sick exposure or precipitating event.  Has tried nyquil without relief.  Symptoms are made worse with deep inspiration.  Denies fever, chills, fatigue, sinus pain, rhinorrhea, sore throat, chest pain, nausea, changes in bowel or bladder habits.    Received flu shot this year: no.  ROS: As per HPI.  Past Medical History:  Diagnosis Date  . Anxiety   . Diabetes mellitus without complication ([place])   . Hypertension   . Major depressive disorder, recurrent, severe with psychotic features ([place])    History reviewed. No pertinent surgical history. No Known Allergies No current facility-administered medications on file prior to encounter.    Current Outpatient Medications on File Prior to Encounter  Medication Sig Dispense Refill  . benztropine (COGENTIN) 0.5 MG tablet Take 1 tablet (0.5 mg total) by mouth 2 (two) times daily. 30 tablet 1  . fluPHENAZine (PROLIXIN) 10 MG tablet Take 1 tablet (10 mg total) by mouth at bedtime. 30 tablet 0  . fluPHENAZine decanoate (PROLIXIN) 25 MG/ML injection Inject 1 mL (25 mg total) into the muscle every 28 (twenty-eight) days. Last administration on 08/20/18. 1 mL 0  . OLANZapine (ZYPREXA) 20 MG tablet Take 1 tablet (20 mg total) by mouth at bedtime. 30 tablet 0  . traZODone (DESYREL) 100 MG tablet Take 1 tablet (100 mg total) by mouth at bedtime as needed for sleep. 30 tablet 0   Social History   Socioeconomic History  . Marital status: Single    Spouse name: Not on file  . Number of children: Not on file  . Years of education: Not on file  . Highest education level: Not on file  Occupational History  . Not on file  Social Needs  . Financial resource strain: Not on file  . Food insecurity:    Worry:  Not on file    Inability: Not on file  . Transportation needs:    Medical: Not on file    Non-medical: Not on file  Tobacco Use  . Smoking status: Never Smoker  . Smokeless tobacco: Never Used  Substance and Sexual Activity  . Alcohol use: No  . Drug use: No  . Sexual activity: Never  Lifestyle  . Physical activity:    Days per week: Not on file    Minutes per session: Not on file  . Stress: Not on file  Relationships  . Social connections:    Talks on phone: Not on file    Gets together: Not on file    Attends religious service: Not on file    Active member of club or organization: Not on file    Attends meetings of clubs or organizations: Not on file    Relationship status: Not on file  . Intimate partner violence:    Fear of current or ex partner: Not on file    Emotionally abused: Not on file    Physically abused: Not on file    Forced sexual activity: Not on file  Other Topics Concern  . Not on file  Social History Narrative  . Not on file   Family History  Problem Relation Age of Onset  . Hypertension Mother   . Diabetes Mother   . Aneurysm Mother   . Hypertension  Father   . Diabetes Father   . Hypertension Sister   . Diabetes Sister   . Hypertension Other   . Diabetes Other     OBJECTIVE:  Vitals:   10/27/18 1234  BP: 126/87  Pulse: 89  Resp: 18  Temp: 98.7 F (37.1 C)  TempSrc: Oral  SpO2: 98%     General appearance: alert; appears fatigued, but nontoxic; speaking in full sentences and tolerating own secretions HEENT: NCAT; Ears: EACs clear, TMs pearly gray; Eyes: PERRL.  EOM grossly intact. Nose: nares patent without rhinorrhea, Throat: oropharynx clear, tonsils non erythematous or enlarged, uvula midline  Neck: supple without LAD Lungs: unlabored respirations, symmetrical air entry; cough: moderate; no respiratory distress; possible wheezes heard over RLL Heart: regular rate and rhythm.  Radial pulses 2+ symmetrical bilaterally Skin: warm  and dry Psychological: alert and cooperative; normal mood and affect  DIAGNOSTIC STUDIES:  Dg Chest 2 View  Result Date: 10/27/2018 CLINICAL DATA:  53 year old female with history of cough for the past 4 days. Shortness of breath and wheezing. EXAM: CHEST - 2 VIEW COMPARISON:  Chest x-ray 05/20/2018. FINDINGS: Lung volumes are low. No consolidative airspace disease. No pleural effusions. No pneumothorax. No pulmonary nodule or mass noted. Pulmonary vasculature and the cardiomediastinal silhouette are within normal limits. IMPRESSION: 1. Low lung volumes without radiographic evidence of acute cardiopulmonary disease. Electronically Signed   By: Vinnie Langton M.D.   On: 10/27/2018 13:15     ASSESSMENT & PLAN:  1. Acute bronchitis, unspecified organism   2. Cough   3. Wheezes     Meds ordered this encounter  Medications  . predniSONE (DELTASONE) 20 MG tablet    Sig: Take 1 tablet (20 mg total) by mouth 2 (two) times daily with a meal for 5 days.    Dispense:  10 tablet    Refill:  0    Order Specific Question:   Supervising Provider    Answer:   Raylene Everts [[id number]]  . benzonatate (TESSALON) 100 MG capsule    Sig: Take 1 capsule (100 mg total) by mouth every 8 (eight) hours.    Dispense:  21 capsule    Refill:  0    Order Specific Question:   Supervising Provider    Answer:   Raylene Everts [[id number]]   Deferred breathing treatment and albuterol treatment due to hx of prolonged QT.   X-rays did not show signs of cardiopulmonary disease Will treat you today for potential bronchitis Get plenty of rest and push fluids Prednisone prescribed.  Take as directed and to completion Tessalon Perles prescribed for cough Use OTC medications like ibuprofen or tylenol as needed fever or pain Follow up with PCP or community Health if symptoms persist Return or go to ER if you have any new or worsening symptoms fever, chills, nausea, vomiting, chest pain, cough, shortness of breath,  wheezing, abdominal pain, changes in bowel or bladder habits, etc...  Reviewed expectations re: course of current medical issues. Questions answered. Outlined signs and symptoms indicating need for more acute intervention. Patient verbalized understanding. After Visit Summary given.         Lestine Box, PA-C 10/27/18 1339

## 2018-11-26 DIAGNOSIS — F22 Delusional disorders: Secondary | ICD-10-CM | POA: Diagnosis not present

## 2018-12-30 ENCOUNTER — Emergency Department (HOSPITAL_COMMUNITY)
Admission: EM | Admit: 2018-12-30 | Discharge: 2018-12-30 | Disposition: A | Discharge status: Home / Self Care | Payer: Medicare Other | Attending: Emergency Medicine | Admitting: Emergency Medicine

## 2018-12-30 ENCOUNTER — Encounter (HOSPITAL_COMMUNITY): Payer: Self-pay | Admitting: Emergency Medicine

## 2018-12-30 DIAGNOSIS — F22 Delusional disorders: Secondary | ICD-10-CM | POA: Diagnosis not present

## 2018-12-30 DIAGNOSIS — I10 Essential (primary) hypertension: Secondary | ICD-10-CM | POA: Insufficient documentation

## 2018-12-30 DIAGNOSIS — R42 Dizziness and giddiness: Secondary | ICD-10-CM | POA: Diagnosis not present

## 2018-12-30 DIAGNOSIS — Z0000 Encounter for general adult medical examination without abnormal findings: Secondary | ICD-10-CM | POA: Diagnosis not present

## 2018-12-30 DIAGNOSIS — Z79899 Other long term (current) drug therapy: Secondary | ICD-10-CM | POA: Diagnosis not present

## 2018-12-30 DIAGNOSIS — E119 Type 2 diabetes mellitus without complications: Secondary | ICD-10-CM | POA: Insufficient documentation

## 2018-12-30 LAB — CBC
HCT: 40.6 % (ref 36.0–46.0)
Hemoglobin: 11.8 g/dL — ABNORMAL LOW (ref 12.0–15.0)
MCH: 23.8 pg — ABNORMAL LOW (ref 26.0–34.0)
MCHC: 29.1 g/dL — AB (ref 30.0–36.0)
MCV: 81.9 fL (ref 80.0–100.0)
Platelets: 317 10*3/uL (ref 150–400)
RBC: 4.96 MIL/uL (ref 3.87–5.11)
RDW: 14.5 % (ref 11.5–15.5)
WBC: 7 10*3/uL (ref 4.0–10.5)
nRBC: 0 % (ref 0.0–0.2)

## 2018-12-30 LAB — BASIC METABOLIC PANEL
Anion gap: 9 (ref 5–15)
BUN: 19 mg/dL (ref 6–20)
CO2: 24 mmol/L (ref 22–32)
CREATININE: 0.92 mg/dL (ref 0.44–1.00)
Calcium: 9 mg/dL (ref 8.9–10.3)
Chloride: 107 mmol/L (ref 98–111)
GFR calc Af Amer: >60 mL/min (ref >60)
GFR calc non Af Amer: >60 mL/min (ref >60)
Glucose, Bld: 110 mg/dL — ABNORMAL HIGH (ref 70–99)
Potassium: 3.6 mmol/L (ref 3.5–5.1)
Sodium: 140 mmol/L (ref 135–145)

## 2018-12-30 MED ORDER — SODIUM CHLORIDE 0.9 % IV BOLUS: 1000.0000 mL | Freq: Once | INTRAVENOUS | Status: DC

## 2018-12-30 NOTE — ED Notes (Signed)
Pt verbalized understanding of discharge paperwork and follow-up care.  °

## 2018-12-30 NOTE — ED Provider Notes (Signed)
[facility] EMERGENCY DEPARTMENT Provider Note   CSN: [id number] Arrival date & time: 12/30/18  1547    History   Chief Complaint Chief Complaint  Patient presents with  . Dizziness    HPI [name] is a 53 y.o. female.      Mental Health Problem  Presenting symptoms: delusional and paranoid behavior   Presenting symptoms: no suicidal thoughts and no suicidal threats   Degree of incapacity (severity):  Moderate Onset quality:  Gradual Timing:  Constant Progression:  Unchanged Chronicity:  Recurrent Context: noncompliance   Treatment compliance:  Untreated Relieved by:  None tried Worsened by:  Nothing Ineffective treatments:  None tried Associated symptoms: no abdominal pain, no anxiety, no chest pain and no headaches   Risk factors: hx of mental illness     Past Medical History:  Diagnosis Date  . Anxiety   . Diabetes mellitus without complication ([place])   . Hypertension   . Major depressive disorder, recurrent, severe with psychotic features [facility])     Patient Active Problem List   Diagnosis Date Noted  . Major depressive disorder, recurrent episode, severe, with psychosis ([place])   . Schizophrenia ([place]) 03/26/2018  . Delusional disorder ([place]) 01/07/2018  . Anxiety 03/29/2016  . Homelessness 11/18/2015  . Tobacco use disorder 07/05/2015  . Hypokalemia 05/30/2015  . Obstipation 05/30/2015  . Prolonged QT interval 05/30/2015  . Hypertension 05/30/2015  . Suicidal ideations 03/12/2015  . Diabetes ([place]) 09/06/2012  . Paranoia ([place]) 04/16/2012    History reviewed. No pertinent surgical history.   OB History   No obstetric history on file.      Home Medications    Prior to Admission medications   Medication Sig Start Date End Date Taking? Authorizing Provider  benzonatate (TESSALON) 100 MG capsule Take 1 capsule (100 mg total) by mouth every 8 (eight) hours. 10/27/18   [place], [place], PA-C  benztropine (COGENTIN) 0.5 MG tablet  Take 1 tablet (0.5 mg total) by mouth 2 (two) times daily. 08/24/18   Pennelope Bracken, MD  fluPHENAZine (PROLIXIN) 10 MG tablet Take 1 tablet (10 mg total) by mouth at bedtime. 08/24/18   Pennelope Bracken, MD  fluPHENAZine decanoate (PROLIXIN) 25 MG/ML injection Inject 1 mL (25 mg total) into the muscle every 28 (twenty-eight) days. Last administration on 08/20/18. 09/17/18   Pennelope Bracken, MD  OLANZapine (ZYPREXA) 20 MG tablet Take 1 tablet (20 mg total) by mouth at bedtime. 08/24/18   Pennelope Bracken, MD  traZODone (DESYREL) 100 MG tablet Take 1 tablet (100 mg total) by mouth at bedtime as needed for sleep. 08/24/18   Pennelope Bracken, MD    Family History Family History  Problem Relation Age of Onset  . Hypertension Mother   . Diabetes Mother   . Aneurysm Mother   . Hypertension Father   . Diabetes Father   . Hypertension Sister   . Diabetes Sister   . Hypertension Other   . Diabetes Other     Social History Social History   Tobacco Use  . Smoking status: Never Smoker  . Smokeless tobacco: Never Used  Substance Use Topics  . Alcohol use: No  . Drug use: No     Allergies   Patient has no known allergies.   Review of Systems Review of Systems  Constitutional: Negative for chills and fever.  HENT: Negative for ear pain and sore throat.   Eyes: Negative for pain and visual disturbance.  Respiratory: Negative for  cough and shortness of breath.   Cardiovascular: Negative for chest pain and palpitations.  Gastrointestinal: Negative for abdominal pain and vomiting.  Genitourinary: Negative for dysuria and hematuria.  Musculoskeletal: Negative for arthralgias and back pain.  Skin: Negative for color change and rash.  Neurological: Negative for seizures, syncope and headaches.  Psychiatric/Behavioral: Positive for paranoia. Negative for suicidal ideas. The patient is not nervous/anxious.   All other systems reviewed and are  negative.    Physical Exam Updated Vital Signs BP (!) 129/91   Pulse 85   Temp 98.2 F (36.8 C) (Oral)   Resp 19   LMP 10/29/2011 Comment: NEG U PREG 02/24/17  SpO2 100%   Physical Exam Vitals signs and nursing note reviewed.  Constitutional:      General: She is not in acute distress.    Appearance: She is well-developed.  HENT:     Head: Normocephalic and atraumatic.  Eyes:     Conjunctiva/sclera: Conjunctivae normal.  Neck:     Musculoskeletal: Neck supple.  Cardiovascular:     Rate and Rhythm: Normal rate and regular rhythm.     Heart sounds: No murmur.  Pulmonary:     Effort: Pulmonary effort is normal. No respiratory distress.     Breath sounds: Normal breath sounds.  Abdominal:     Palpations: Abdomen is soft.     Tenderness: There is no abdominal tenderness.  Skin:    General: Skin is warm and dry.     Capillary Refill: Capillary refill takes less than 2 seconds.  Neurological:     General: No focal deficit present.     Mental Status: She is alert.  Psychiatric:        Mood and Affect: Mood normal.        Speech: Speech normal.        Behavior: Behavior normal.        Thought Content: Thought content is paranoid.      ED Treatments / Results  Labs (all labs ordered are listed, but only abnormal results are displayed) Labs Reviewed  CBC - Abnormal; Notable for the following components:      Result Value   Hemoglobin 11.8 (*)    MCH 23.8 (*)    MCHC 29.1 (*)    All other components within normal limits  BASIC METABOLIC PANEL - Abnormal; Notable for the following components:   Glucose, Bld 110 (*)    All other components within normal limits    EKG EKG Interpretation  Date/Time:  Thursday December 30 2018 16:21:16 EST Ventricular Rate:  84 PR Interval:    QRS Duration: 93 QT Interval:  389 QTC Calculation: 460 R Axis:   38 Text Interpretation:  Sinus rhythm Borderline T abnormalities, anterior leads No acute changes Confirmed by Varney Biles [phone]) on 12/30/2018 4:43:59 PM   Radiology No results found.  Procedures Procedures (including critical care time)  Medications Ordered in ED Medications  sodium chloride 0.9 % bolus 1,000 mL (has no administration in time range)     Initial Impression / Assessment and Plan / ED Course  I have reviewed the triage vital signs and the nursing notes.  Pertinent labs & imaging results that were available during my care of the patient were reviewed by me and considered in my medical decision making (see chart for details).        53 year old female significant past medical history of schizophrenia who presents with worrying that her potassium might be low and that  some imaging studies might of been stolen from her due to someone that she saw in the emergency department back in April of last year.  Patient denies any chest pain, shortness of breath, infectious-like symptoms.  Concern for schizophrenia in the setting of the patient not taking her medication.  Will obtain basic laboratory studies at this time  Physical exam negative for any acute findings of paranoia.  Denies SI, HI, AVH.  Laboratory studies indicate no acute abnormality.  Patient in agreement with discharge and appreciated checking her electrolytes.  Patient can follow-up with primary care provider.  Patient with strict return precautions.  The above care was discussed and agreed upon by my attending physician.  Final Clinical Impressions(s) / ED Diagnoses   Final diagnoses:  Examination, general medical    ED Discharge Orders    None       Orson Aloe, MD 12/30/18 6979    Varney Biles, MD 12/30/18 2103

## 2018-12-30 NOTE — ED Triage Notes (Signed)
Pt reports feeling dizziness, light-headed and feels like she is gong to die. Reports [facility] pumped her full of fluids and potassium. States her fluid levels have dropped and feels like there is a physical hole inside of her

## 2019-02-22 ENCOUNTER — Other Ambulatory Visit: Payer: Self-pay

## 2019-02-22 ENCOUNTER — Inpatient Hospital Stay (HOSPITAL_COMMUNITY)
Admission: RE | Admit: 2019-02-22 | Discharge: 2019-02-25 | DRG: 885 | Disposition: A | Discharge status: Home / Self Care | Payer: Medicare Other | Attending: Psychiatry | Admitting: Psychiatry

## 2019-02-22 ENCOUNTER — Encounter (HOSPITAL_COMMUNITY): Payer: Self-pay

## 2019-02-22 DIAGNOSIS — I10 Essential (primary) hypertension: Secondary | ICD-10-CM | POA: Diagnosis present

## 2019-02-22 DIAGNOSIS — F200 Paranoid schizophrenia: Secondary | ICD-10-CM

## 2019-02-22 DIAGNOSIS — F419 Anxiety disorder, unspecified: Secondary | ICD-10-CM | POA: Diagnosis present

## 2019-02-22 DIAGNOSIS — Z79899 Other long term (current) drug therapy: Secondary | ICD-10-CM

## 2019-02-22 DIAGNOSIS — F209 Schizophrenia, unspecified: Principal | ICD-10-CM | POA: Diagnosis present

## 2019-02-22 DIAGNOSIS — Z833 Family history of diabetes mellitus: Secondary | ICD-10-CM

## 2019-02-22 DIAGNOSIS — E119 Type 2 diabetes mellitus without complications: Secondary | ICD-10-CM | POA: Diagnosis present

## 2019-02-22 DIAGNOSIS — Z8249 Family history of ischemic heart disease and other diseases of the circulatory system: Secondary | ICD-10-CM

## 2019-02-22 DIAGNOSIS — F22 Delusional disorders: Secondary | ICD-10-CM | POA: Diagnosis present

## 2019-02-22 MED ORDER — LORAZEPAM 2 MG/ML IJ SOLN: 2.0000 mg | Freq: Once | INTRAMUSCULAR | Status: AC | PRN

## 2019-02-22 MED ORDER — LORAZEPAM 1 MG PO TABS
2.0000 mg | ORAL_TABLET | Freq: Once | ORAL | Status: AC | PRN
  Administered 2019-02-24: 19:00:00 2 mg via ORAL
  Filled 2019-02-22: qty 2

## 2019-02-22 MED ORDER — TRAZODONE HCL 50 MG PO TABS
50.0000 mg | ORAL_TABLET | Freq: Every evening | ORAL | Status: DC | PRN
  Administered 2019-02-23: 50 mg via ORAL
  Filled 2019-02-22: qty 4
  Filled 2019-02-22: qty 1

## 2019-02-22 MED ORDER — HYDROXYZINE HCL 25 MG PO TABS
25.0000 mg | ORAL_TABLET | Freq: Three times a day (TID) | ORAL | Status: DC | PRN
  Administered 2019-02-23 (×2): 25 mg via ORAL
  Filled 2019-02-22: qty 1
  Filled 2019-02-22: qty 5
  Filled 2019-02-22: qty 1

## 2019-02-22 MED ORDER — FLUPHENAZINE HCL 5 MG PO TABS
10.0000 mg | ORAL_TABLET | Freq: Every day | ORAL | Status: DC
  Administered 2019-02-22 – 2019-02-23 (×2): 10 mg via ORAL
  Filled 2019-02-22 (×3): qty 2
  Filled 2019-02-22: qty 1
  Filled 2019-02-22: qty 2
  Filled 2019-02-22: qty 1

## 2019-02-22 NOTE — H&P (Signed)
[facility] Provider Admission PAA/H&P  Patient Identification: [name] MRN:  [id number] Date of Evaluation:  02/22/2019 Chief Complaint:  Schizophrenia Principal Diagnosis: Schizophrenia ([place]) Diagnosis:  Principal Problem:   Schizophrenia ([place]) Active Problems:   Delusional disorder ([place])  History of Present Illness: Patient presented to [facility] as a walk in; brought in by the police after she called reporting that she was on her way to [employer] and thought Karmen Stabs and thought she was going to shoot and kill her.  States that [employer] are and every one at church and [facility] are all in on it together and that they are going to stone her to death.  Patient is not a good historian related to she is unable to answer any other question or focus on  Anything other than the cyber stalking and contacting the FBI and filing complaints with police.  Patient dose have a prior history of psychiatric hospitalization 08/14/2018 - 08/24/18 and ED visits 09/2018, 10/2018, and 12/2018.   Associated Signs/Symptoms: Depression Symptoms:  depressed mood, anxiety, (Hypo) Manic Symptoms:  Delusions, Anxiety Symptoms:  Excessive Worry, Psychotic Symptoms:  Delusions, Paranoia, PTSD Symptoms: Negative Total Time spent with patient: 30 minutes  Past Psychiatric History: Previous diagnosis of schizophrenia and delusion disorder  Is the patient at risk to self? Yes.    Has the patient been a risk to self in the past 6 months? No.  Has the patient been a risk to self within the distant past? Yes.    Is the patient a risk to others? No.  Has the patient been a risk to others in the past 6 months? No.  Has the patient been a risk to others within the distant past? No.   Prior Inpatient Therapy: Prior Inpatient Therapy: Yes Prior Therapy Dates: 02/2018 Prior Therapy Facilty/Provider(s): [facility] Reason for Treatment: delusionsYes Prior Outpatient Therapy: Prior Outpatient Therapy:  Yes Prior Therapy Dates: 2019 Prior Therapy Facilty/Provider(s): [facility] Reason for Treatment: delusions Does patient have an ACCT team?: No Does patient have Intensive In-House Services?  : No Does patient have [facility] services? : No Does patient have P4CC services?: NoYes  Alcohol Screening:   Substance Abuse History in the last 12 months:  No. Consequences of Substance Abuse: NA Previous Psychotropic Medications: Yes  Psychological Evaluations: Yes  Past Medical History:  Past Medical History:  Diagnosis Date  . Anxiety   . Diabetes mellitus without complication ([place])   . Hypertension   . Major depressive disorder, recurrent, severe with psychotic features ([place])    No past surgical history on file. Family History:  Family History  Problem Relation Age of Onset  . Hypertension Mother   . Diabetes Mother   . Aneurysm Mother   . Hypertension Father   . Diabetes Father   . Hypertension Sister   . Diabetes Sister   . Hypertension Other   . Diabetes Other    Family Psychiatric History: Unaware Tobacco Screening:   Social History:  Social History   Substance and Sexual Activity  Alcohol Use No     Social History   Substance and Sexual Activity  Drug Use No    Additional Social History: Marital status: Single    Pain Medications: See MAR Prescriptions: See MAR Over the Counter: See MAR History of alcohol / drug use?: No history of alcohol / drug abuse Longest period of sobriety (when/how long): NA    Allergies:  No Known Allergies Lab Results:  No results found for this or any previous visit (from the past 48 hour(s)).  Blood Alcohol level:  Lab Results  Component Value Date   ETH <10 08/14/2018   ETH <10 09/38/1829    Metabolic Disorder Labs:  Lab Results  Component Value Date   HGBA1C 6.1 (H) 07/05/2015   MPG 143 (H) 08/30/2012   No results found for: PROLACTIN Lab Results  Component Value Date   CHOL 223 (H) 07/05/2015    TRIG 109 07/05/2015   HDL 51 07/05/2015   CHOLHDL 4.4 07/05/2015   VLDL 22 07/05/2015   LDLCALC 150 (H) 07/05/2015   LDLCALC 141 (H) 08/30/2012    Current Medications: No current facility-administered medications for this encounter.    PTA Medications: Medications Prior to Admission  Medication Sig Dispense Refill Last Dose  . benzonatate (TESSALON) 100 MG capsule Take 1 capsule (100 mg total) by mouth every 8 (eight) hours. 21 capsule 0   . benztropine (COGENTIN) 0.5 MG tablet Take 1 tablet (0.5 mg total) by mouth 2 (two) times daily. 30 tablet 1   . fluPHENAZine (PROLIXIN) 10 MG tablet Take 1 tablet (10 mg total) by mouth at bedtime. 30 tablet 0   . fluPHENAZine decanoate (PROLIXIN) 25 MG/ML injection Inject 1 mL (25 mg total) into the muscle every 28 (twenty-eight) days. Last administration on 08/20/18. 1 mL 0   . OLANZapine (ZYPREXA) 20 MG tablet Take 1 tablet (20 mg total) by mouth at bedtime. 30 tablet 0   . traZODone (DESYREL) 100 MG tablet Take 1 tablet (100 mg total) by mouth at bedtime as needed for sleep. 30 tablet 0     Musculoskeletal: Strength & Muscle Tone: within normal limits Gait & Station: normal Patient leans: N/A  Psychiatric Specialty Exam: Physical Exam  Constitutional: She is oriented to person, place, and time. She appears well-developed.  Neck: Normal range of motion.  Respiratory: Effort normal.  Musculoskeletal: Normal range of motion.  Neurological: She is alert and oriented to person, place, and time.  Skin: Skin is warm and dry.  Psychiatric: Her mood appears anxious. Thought content is delusional. Thought content is not paranoid. She expresses no homicidal and no suicidal ideation.    Review of Systems  Psychiatric/Behavioral:       Patient would not answer any questions related to suicidal/homicidal ideation.  Patient would only talk about being cyber stalked by church and trying contact FBI.    All other systems reviewed and are negative.    Blood pressure (!) 127/96, pulse 86, temperature 98.4 F (36.9 C), resp. rate 16, last menstrual period 10/29/2011, SpO2 100 %.There is no height or weight on file to calculate BMI.  General Appearance: Casual  Eye Contact:  Good  Speech:  Clear and Coherent and Normal Rate  Volume:  Normal  Mood:  Anxious  Affect:  Labile  Thought Process:  Disorganized and Linear  Orientation:  Full (Time, Place, and Person)  Thought Content:  Delusions, Paranoid Ideation and Rumination  Suicidal Thoughts:  No  Homicidal Thoughts:  No  Memory:  Unable to determine at this time  Judgement:  Impaired  Insight:  Lacking  Psychomotor Activity:  Normal  Concentration:  Concentration: Poor and Attention Span: Poor  Recall:  Poor  Fund of Knowledge:  Fair  Language:  Good  Akathisia:  No  Handed:  Right  AIMS (if indicated):     Assets:  Desire for Improvement  ADL's:  Intact  Cognition:  WNL  Sleep:  Treatment Plan Summary: Plan Inpatient psychiatric treatment.  Will admit to observation unit until appropriate psychiatric bed available  Observation Level/Precautions:  15 minute checks Laboratory: Will order following labs  CBC Chemistry Profile HbAIC UDS UA Psychotherapy:  As appropriate Medications:  Prolixin 10 Bid Consultations:  As appropriate Discharge Concerns:  Safety  Estimated LOS: 24 hours Other:      Jaina Morin, NP 4/28/20204:27 PM

## 2019-02-22 NOTE — ED Notes (Signed)
Pt sleeping at present, calm & cooperative, no distress noted, 1-1 sitter remains at bedside, Q 15 min rounding.

## 2019-02-22 NOTE — Progress Notes (Signed)
Patient ID: [name], female   DOB: [birth year], 53 y.o.   MRN: [id number] Pt A&O x 3, no distress noted, calm & cooperative at present.  Remains paranoid & delusional.  Resting at present.  Monitoring for safety, 1-1 sitter at bedside, Q 15 min rounding in progress.

## 2019-02-22 NOTE — H&P (Signed)
Behavioral Health Medical Screening Exam  [name] is an 53 y.o. female.who presented to [facility] as a walk in. Patient was transported by police. She stated that she was brought to the hospital because she saw Karmen Stabs at [employer] and she thought Karmen Stabs was going to," shoot and kill" her. Patient is very hyper-focused on Karmen Stabs, members at church, and [place]. She states she has been cyber stalked since 2006 and states she feel as though the people who are cyber stalking her are going to, " stone me to death." Patient is a poor historian as she ruminates on cyber stalking and Karmen Stabs. She states that Karmen Stabs is a member at [employer]. She states that she has talked to the [facility] and the police and have filed complaints due to the cyber stalking although nothing has been done. She has a previous psychiatric history of schizophrenia with multiple psychiatric hospitalizations; 08/14/2018 - 08/24/18 and ED visits 09/2018, 10/2018, and 12/2018. She states, " I just want help with getting on my medicine. I need something to help me calm down and help me sleep."   Total Time spent with patient: 20 minutes  Psychiatric Specialty Exam: Physical Exam  ROS  Blood pressure (!) 127/96, pulse 86, temperature 98.4 F (36.9 C), resp. rate 16, last menstrual period 10/29/2011, SpO2 100 %.There is no height or weight on file to calculate BMI.  General Appearance: Casual  Eye Contact:  Good  Speech:  Clear and Coherent and Normal Rate  Volume:  Normal  Mood:  Anxious  Affect:  Labile  Thought Process:  Disorganized and Linear  Orientation:  Full (Time, Place, and Person)  Thought Content:  Delusions, Paranoid Ideation and Rumination  Suicidal Thoughts:  No  Homicidal Thoughts:  No  Memory:  unable to determine   Judgement:  Impaired  Insight:  Lacking  Psychomotor Activity:  Normal  Concentration: Concentration: Poor and Attention Span: Poor  Recall:  Poor  Fund  of Knowledge:Fair  Language: Good  Akathisia:  No  Handed:  Right  AIMS (if indicated):     Assets:  Desire for Improvement  Sleep:       Musculoskeletal: Strength & Muscle Tone: within normal limits Gait & Station: normal Patient leans: N/A  Blood pressure (!) 127/96, pulse 86, temperature 98.4 F (36.9 C), resp. rate 16, last menstrual period 10/29/2011, SpO2 100 %.  Recommendations:  Based on my evaluation the patient does not appear to have an emergency medical condition.   Patient does meet criteria for inpatient hospitalization which is recommended. She will be admitted to the observation unit and once a bed becomes available, she will be admitted to a bed on the [facility] at [place]. Patient has been IVC'd by MD following his evaluation.    Mordecai Maes, NP 02/22/2019, 4:30 PM

## 2019-02-22 NOTE — Progress Notes (Signed)
D: Pt alert and oriented to person, place, and day. Pt understands where she is but not why she is here. Pt does not believe she should be here. Pt states that she requested to go the the ED and was brought here by mistake. Pt appears very paranoid and has difficulty stayed focused on the topic presented. Pt is hyper focused on being cyber stalked. Pt states she has called the police and FBI are involved. Pt is cooperative. Pt denies experiencing any pain, SI/HI, or AVH at this time.   A:  Support and encouragement provided. Frequent verbal contact made. Routine safety checks conducted q15 minutes.   R:Pt verbally contracts for safety at this time. Pt complaint with medications and treatment plan. Pt interacts with staff on the unit. Pt remains safe at this time. Will continue to monitor.

## 2019-02-22 NOTE — Progress Notes (Signed)
[place] NOVEL CORONAVIRUS (COVID-19) DAILY CHECK-OFF SYMPTOMS - answer yes or no to each - every day NO YES  Have you had a fever in the past 24 hours?  . Fever (Temp > 37.80C / 100F) X   Have you had any of these symptoms in the past 24 hours? . New Cough .  Sore Throat  .  Shortness of Breath .  Difficulty Breathing .  Unexplained Body Aches   X   Have you had any one of these symptoms in the past 24 hours not related to allergies?   . Runny Nose .  Nasal Congestion .  Sneezing   X   If you have had runny nose, nasal congestion, sneezing in the past 24 hours, has it worsened?  X   EXPOSURES - check yes or no X   Have you traveled outside the state in the past 14 days?  X   Have you been in contact with someone with a confirmed diagnosis of COVID-19 or PUI in the past 14 days without wearing appropriate PPE?  X   Have you been living in the same home as a person with confirmed diagnosis of COVID-19 or a PUI (household contact)?    X   Have you been diagnosed with COVID-19?    X              What to do next: Answered NO to all: Answered YES to anything:   Proceed with unit schedule Follow the [facility] Inpatient Flowsheet.   

## 2019-02-22 NOTE — Plan of Care (Signed)
[name] Observation Crisis Plan  Reason for Crisis Plan:  Crisis Stabilization   Plan of Care:  Referral for Inpatient Hospitalization  Family Support:    None  Current Living Environment:  Living Arrangements: Alone  Insurance:   Hospital Account    Name Acct ID Class Status Primary Coverage   [name] [id number] [place] - MEDICARE PART A AND B        Guarantor Account (for Hospital Account [id number])    Name Relation to Pt Service Area Active? Acct Type   [name] Self [facility] Yes Behavioral Health   Address Phone       [name] [phone])          Coverage Information (for Hospital Account [id number])    F/O Payor/Plan Precert #   MEDICARE/MEDICARE PART A AND B    Subscriber Subscriber #   [name] [id number]   Address Phone   [address] [place], [place] [address]       Legal Guardian:  Legal Guardian: Other:(Self)  Primary Care Provider:  Patient, No Pcp Per  Current Outpatient Providers:  none  Psychiatrist:  Name of Psychiatrist: none  Counselor/Therapist:  Name of Therapist: none  Compliant with Medications:  No  Additional Information:   [name] 4/28/20206:49 PM

## 2019-02-22 NOTE — BH Assessment (Addendum)
Assessment Note  [name] is an 53 y.o. female.  The pt came in due to to delusional thoughts.  The pt stated she was at [facility] and thought she saw someone she knew and became scared.  The pt reported she has been cyber stalked since 2007 by various people and people in her church.  The pt was inpatient 02/2018 due to a similar presentation of being delusional and thinking that people were cyber stalking her.  The pt stated she wants to kill Viann Fish, which is a person she claims is stalking her.  The pt reported she has called the FBI and will be getting money from [place], [employer], and [employer] after she sues them.  The pt sated she doesn't talk to her family, because her family members are being stalked also.  She stated she was getting a PhD, but due to the various people stalking her she is listed as failing out of school.  She isn't currently seeing a Social worker.  She stated the last time she saw a counselor was in 2019 at [facility].  The pt lives alone.  She denies SI, self harm, legal issues and a history of abuse.  She stated she is sleeping well and is eating one meal a day.  She denies SA.  Pt is dressed in casual clothes. She is alert and oriented x4. Pt speaks in a clear tone, at moderate volume and normal pace. Eye contact is fair. Pt's mood is anxious.  The pt paced the room and then would sit during the assessment Thought process is tangential. There is no indication Pt is currently responding to internal stimuli or experiencing delusional thought content.?Pt was cooperative throughout assessment.    Diagnosis: F20.9 Schizophrenia  Past Medical History:  Past Medical History:  Diagnosis Date  . Anxiety   . Diabetes mellitus without complication ([place])   . Hypertension   . Major depressive disorder, recurrent, severe with psychotic features ([place])     No past surgical history on file.  Family History:  Family History  Problem Relation Age of  Onset  . Hypertension Mother   . Diabetes Mother   . Aneurysm Mother   . Hypertension Father   . Diabetes Father   . Hypertension Sister   . Diabetes Sister   . Hypertension Other   . Diabetes Other     Social History:  reports that she has never smoked. She has never used smokeless tobacco. She reports that she does not drink alcohol or use drugs.  Additional Social History:  Alcohol / Drug Use Pain Medications: See MAR Prescriptions: See MAR Over the Counter: See MAR History of alcohol / drug use?: No history of alcohol / drug abuse Longest period of sobriety (when/how long): NA  CIWA: CIWA-Ar BP: (!) 127/96 Pulse Rate: 86 COWS:    Allergies: No Known Allergies  Home Medications: (Not in a hospital admission)   OB/GYN Status:  Patient's last menstrual period was 10/29/2011.  General Assessment Data Location of Assessment: [facility] Assessment Services TTS Assessment: In system Is this a Tele or Face-to-Face Assessment?: Face-to-Face Is this an Initial Assessment or a Re-assessment for this encounter?: Initial Assessment Patient Accompanied by:: N/A Language Other than English: No Living Arrangements: (home) What gender do you identify as?: Female Marital status: Single Maiden name: Pe[place] Pregnancy Status: No Living Arrangements: Alone Can pt return to current living arrangement?: Yes Admission Status: Involuntary Petitioner: ED Attending Is patient capable of signing  voluntary admission?: (pt IVC) Referral Source: Self/Family/Friend Insurance type: Medicare  Medical Screening Exam ([place]) Medical Exam completed: Yes  Crisis Care Plan Living Arrangements: Alone Legal Guardian: Other:(Self) Name of Psychiatrist: none Name of Therapist: none  Education Status Is patient currently in school?: No Is the patient employed, unemployed or receiving disability?: Unemployed  Risk to self with the past 6 months Suicidal Ideation: No Has patient been a  risk to self within the past 6 months prior to admission? : No Suicidal Intent: No Has patient had any suicidal intent within the past 6 months prior to admission? : No Is patient at risk for suicide?: No Suicidal Plan?: No Has patient had any suicidal plan within the past 6 months prior to admission? : No Access to Means: No What has been your use of drugs/alcohol within the last 12 months?: none Previous Attempts/Gestures: No How many times?: 0 Other Self Harm Risks: none Triggers for Past Attempts: None known Intentional Self Injurious Behavior: None Family Suicide History: No Recent stressful life event(s): Other (Comment)(delusion) Persecutory voices/beliefs?: Yes Substance abuse history and/or treatment for substance abuse?: No Suicide prevention information given to non-admitted patients: Yes  Risk to Others within the past 6 months Homicidal Ideation: Yes-Currently Present Does patient have any lifetime risk of violence toward others beyond the six months prior to admission? : Yes (comment) Thoughts of Harm to Others: Yes-Currently Present Comment - Thoughts of Harm to Others: having thoughts of killing mary Fitzgerald Current Homicidal Intent: No Current Homicidal Plan: No Access to Homicidal Means: No Identified Victim: Viann Fish History of harm to others?: No Assessment of Violence: None Noted Violent Behavior Description: none Does patient have access to weapons?: No Criminal Charges Pending?: No Does patient have a court date: No Is patient on probation?: No  Psychosis Hallucinations: None noted Delusions: Persecutory  Mental Status Report Appearance/Hygiene: Unremarkable Eye Contact: Good Motor Activity: Freedom of movement, Restlessness Speech: Tangential Level of Consciousness: Restless Mood: Suspicious Affect: Apprehensive Anxiety Level: None Thought Processes: Tangential Judgement: Impaired Orientation: Person, Place, Time,  Situation Obsessive Compulsive Thoughts/Behaviors: None  Cognitive Functioning Concentration: Normal Memory: Recent Intact, Remote Intact Is patient IDD: No Insight: Poor Impulse Control: Poor Appetite: Fair Have you had any weight changes? : No Change Sleep: No Change Total Hours of Sleep: 8 Vegetative Symptoms: None  ADLScreening [facility] Assessment Services) Patient's cognitive ability adequate to safely complete daily activities?: Yes Patient able to express need for assistance with ADLs?: Yes Independently performs ADLs?: Yes (appropriate for developmental age)  Prior Inpatient Therapy Prior Inpatient Therapy: Yes Prior Therapy Dates: 02/2018 Prior Therapy Facilty/Provider(s): [facility] Reason for Treatment: delusions  Prior Outpatient Therapy Prior Outpatient Therapy: Yes Prior Therapy Dates: 2019 Prior Therapy Facilty/Provider(s): [facility] Reason for Treatment: delusions Does patient have an ACCT team?: No Does patient have Intensive In-House Services?  : No Does patient have [facility] services? : No Does patient have P4CC services?: No  ADL Screening (condition at time of admission) Patient's cognitive ability adequate to safely complete daily activities?: Yes Patient able to express need for assistance with ADLs?: Yes Independently performs ADLs?: Yes (appropriate for developmental age)       Abuse/Neglect Assessment (Assessment to be complete while patient is alone) Abuse/Neglect Assessment Can Be Completed: Yes Physical Abuse: Denies Verbal Abuse: Denies Sexual Abuse: Denies Exploitation of patient/patient's resources: Denies Self-Neglect: Denies Values / Beliefs Cultural Requests During Hospitalization: None Spiritual Requests During Hospitalization: None Consults Spiritual Care Consult Needed:  No Social Work Consult Needed: No            Disposition:  Disposition Initial Assessment Completed for this Encounter:  Yes Disposition of Patient: Admit Type of inpatient treatment program: Adult NP Shuvon Rankin and Mordecai Maes recommends inpatient treatment.  On Site Evaluation by:   Reviewed with Physician:    Enzo Montgomery 02/22/2019 4:13 PM

## 2019-02-23 ENCOUNTER — Other Ambulatory Visit: Payer: Self-pay | Admitting: Behavioral Health

## 2019-02-23 ENCOUNTER — Encounter: Payer: Self-pay | Admitting: Behavioral Health

## 2019-02-23 DIAGNOSIS — F22 Delusional disorders: Secondary | ICD-10-CM | POA: Diagnosis not present

## 2019-02-23 DIAGNOSIS — Z833 Family history of diabetes mellitus: Secondary | ICD-10-CM | POA: Diagnosis not present

## 2019-02-23 DIAGNOSIS — I10 Essential (primary) hypertension: Secondary | ICD-10-CM | POA: Diagnosis present

## 2019-02-23 DIAGNOSIS — Z79899 Other long term (current) drug therapy: Secondary | ICD-10-CM | POA: Diagnosis not present

## 2019-02-23 DIAGNOSIS — F200 Paranoid schizophrenia: Secondary | ICD-10-CM | POA: Diagnosis not present

## 2019-02-23 DIAGNOSIS — E119 Type 2 diabetes mellitus without complications: Secondary | ICD-10-CM | POA: Diagnosis present

## 2019-02-23 DIAGNOSIS — F419 Anxiety disorder, unspecified: Secondary | ICD-10-CM | POA: Diagnosis present

## 2019-02-23 DIAGNOSIS — Z8249 Family history of ischemic heart disease and other diseases of the circulatory system: Secondary | ICD-10-CM | POA: Diagnosis not present

## 2019-02-23 DIAGNOSIS — F209 Schizophrenia, unspecified: Secondary | ICD-10-CM | POA: Diagnosis present

## 2019-02-23 LAB — CBC
HCT: 38.6 % (ref 36.0–46.0)
Hemoglobin: 11.3 g/dL — ABNORMAL LOW (ref 12.0–15.0)
MCH: 24.4 pg — ABNORMAL LOW (ref 26.0–34.0)
MCHC: 29.3 g/dL — ABNORMAL LOW (ref 30.0–36.0)
MCV: 83.4 fL (ref 80.0–100.0)
Platelets: 282 10*3/uL (ref 150–400)
RBC: 4.63 MIL/uL (ref 3.87–5.11)
RDW: 14.6 % (ref 11.5–15.5)
WBC: 4.8 10*3/uL (ref 4.0–10.5)
nRBC: 0 % (ref 0.0–0.2)

## 2019-02-23 LAB — COMPREHENSIVE METABOLIC PANEL
ALT: 16 U/L (ref 0–44)
AST: 16 U/L (ref 15–41)
Albumin: 3.5 g/dL (ref 3.5–5.0)
Alkaline Phosphatase: 119 U/L (ref 38–126)
Anion gap: 8 (ref 5–15)
BUN: 20 mg/dL (ref 6–20)
CO2: 21 mmol/L — ABNORMAL LOW (ref 22–32)
Calcium: 8.7 mg/dL — ABNORMAL LOW (ref 8.9–10.3)
Chloride: 111 mmol/L (ref 98–111)
Creatinine, Ser: 0.81 mg/dL (ref 0.44–1.00)
GFR calc Af Amer: >60 mL/min (ref >60)
GFR calc non Af Amer: >60 mL/min (ref >60)
Glucose, Bld: 116 mg/dL — ABNORMAL HIGH (ref 70–99)
Potassium: 3.8 mmol/L (ref 3.5–5.1)
Sodium: 140 mmol/L (ref 135–145)
Total Bilirubin: 0.4 mg/dL (ref 0.3–1.2)
Total Protein: 6.6 g/dL (ref 6.5–8.1)

## 2019-02-23 LAB — LIPID PANEL
Cholesterol: 203 mg/dL — ABNORMAL HIGH (ref 0–200)
HDL: 44 mg/dL (ref >40)
LDL Cholesterol: 133 mg/dL — ABNORMAL HIGH (ref 0–99)
Total CHOL/HDL Ratio: 4.6 RATIO
Triglycerides: 132 mg/dL (ref <150)
VLDL: 26 mg/dL (ref 0–40)

## 2019-02-23 LAB — HEMOGLOBIN A1C
Hgb A1c MFr Bld: 6.8 % — ABNORMAL HIGH (ref 4.8–5.6)
Mean Plasma Glucose: 148.46 mg/dL

## 2019-02-23 LAB — TSH: TSH: 2.861 u[IU]/mL (ref 0.350–4.500)

## 2019-02-23 LAB — ETHANOL: Alcohol, Ethyl (B): <10 mg/dL (ref <10)

## 2019-02-23 NOTE — Progress Notes (Signed)
Pt remains sleeping at present, no distress noted, calm & cooperative through out the night, 1-1 sitter at bedside, Q 15 min checks in progress.

## 2019-02-23 NOTE — Progress Notes (Signed)
[facility] MD Progress Note  02/23/2019 11:18 AM [name]  MRN:  [id number] Subjective:   [name] presents to the hospital with psychosis. She endorses delusions that she is being cyber stalked. She wants to kill a person named "Karmen Stabs." She was last admitted to [facility] in 07/2018 for similar presentation. She remained with fixed delusions at discharge although they were not intrusive. On interview, she continues to endorse delusions. She reports that she "filed a harassment" in 2006 on a person named [name]. She reports that he "placed her on cyberware." She believes that she saw someone from her church yesterday. She has not seen her family due to these issues and believes that they are involved. She denies a history of mental health problems. She reports that she has taken medications which have helped with anxiety related to being cyber stalked in the past. She took Prolixin overnight. She denies SI, HI or AVH. She reports fair sleep with multiple nighttime awakenings. She reports, "I'm on cyberware and that's why I gained all this weight" when asked about her appetite.     Principal Problem: Schizophrenia ([place]) Diagnosis: Principal Problem:   Schizophrenia ([place]) Active Problems:   Delusional disorder ([place])  Total Time spent with patient: 30 minutes  Past Psychiatric History: Schizophrenia   Past Medical History:  Past Medical History:  Diagnosis Date  . Anxiety   . Diabetes mellitus without complication ([place])   . Hypertension   . Major depressive disorder, recurrent, severe with psychotic features ([place])    History reviewed. No pertinent surgical history. Family History:  Family History  Problem Relation Age of Onset  . Hypertension Mother   . Diabetes Mother   . Aneurysm Mother   . Hypertension Father   . Diabetes Father   . Hypertension Sister   . Diabetes Sister   . Hypertension Other   . Diabetes Other    Family Psychiatric  History: Denies  Social History:   Social History   Substance and Sexual Activity  Alcohol Use No     Social History   Substance and Sexual Activity  Drug Use No    Social History   Socioeconomic History  . Marital status: Single    Spouse name: Not on file  . Number of children: Not on file  . Years of education: Not on file  . Highest education level: Not on file  Occupational History  . Not on file  Social Needs  . Financial resource strain: Not on file  . Food insecurity:    Worry: Not on file    Inability: Not on file  . Transportation needs:    Medical: Not on file    Non-medical: Not on file  Tobacco Use  . Smoking status: Never Smoker  . Smokeless tobacco: Never Used  Substance and Sexual Activity  . Alcohol use: No  . Drug use: No  . Sexual activity: Never  Lifestyle  . Physical activity:    Days per week: Not on file    Minutes per session: Not on file  . Stress: Not on file  Relationships  . Social connections:    Talks on phone: Not on file    Gets together: Not on file    Attends religious service: Not on file    Active member of club or organization: Not on file    Attends meetings of clubs or organizations: Not on file    Relationship status: Not on file  Other Topics Concern  .  Not on file  Social History Narrative  . Not on file   Additional Social History: She lives alone.    Pain Medications: See MAR Prescriptions: See MAR Over the Counter: See MAR History of alcohol / drug use?: No history of alcohol / drug abuse Longest period of sobriety (when/how long): NA                    Sleep: Fair  Appetite:  Good  Current Medications: Current Facility-Administered Medications  Medication Dose Route Frequency Provider Last Rate Last Dose  . fluPHENAZine (PROLIXIN) tablet 10 mg  10 mg Oral QHS Rankin, Shuvon B, NP   10 mg at 02/22/19 2126  . hydrOXYzine (ATARAX/VISTARIL) tablet 25 mg  25 mg Oral TID PRN Rankin, Shuvon B, NP      . LORazepam (ATIVAN) tablet 2  mg  2 mg Oral Once PRN Rankin, Shuvon B, NP       Or  . LORazepam (ATIVAN) injection 2 mg  2 mg Intramuscular Once PRN Rankin, Shuvon B, NP      . traZODone (DESYREL) tablet 50 mg  50 mg Oral QHS PRN Rankin, Shuvon B, NP        Lab Results:  Results for orders placed or performed during the hospital encounter of 02/22/19 (from the past 48 hour(s))  Ethanol     Status: None   Collection Time: 02/23/19  7:18 AM  Result Value Ref Range   Alcohol, Ethyl (B) <10 <10 mg/dL    Comment: (NOTE) Lowest detectable limit for serum alcohol is 10 mg/dL. For medical purposes only. Performed at [facility], [place] [address], [place], [place] [address]   CBC     Status: Abnormal   Collection Time: 02/23/19  7:18 AM  Result Value Ref Range   WBC 4.8 4.0 - 10.5 K/uL   RBC 4.63 3.87 - 5.11 MIL/uL   Hemoglobin 11.3 (L) 12.0 - 15.0 g/dL   HCT 38.6 36.0 - 46.0 %   MCV 83.4 80.0 - 100.0 fL   MCH 24.4 (L) 26.0 - 34.0 pg   MCHC 29.3 (L) 30.0 - 36.0 g/dL   RDW 14.6 11.5 - 15.5 %   Platelets 282 150 - 400 K/uL   nRBC 0.0 0.0 - 0.2 %    Comment: Performed at [facility], [place] [address], [place], [place] [address]  Comprehensive metabolic panel     Status: Abnormal   Collection Time: 02/23/19  7:18 AM  Result Value Ref Range   Sodium 140 135 - 145 mmol/L   Potassium 3.8 3.5 - 5.1 mmol/L   Chloride 111 98 - 111 mmol/L   CO2 21 (L) 22 - 32 mmol/L   Glucose, Bld 116 (H) 70 - 99 mg/dL   BUN 20 6 - 20 mg/dL   Creatinine, Ser 0.81 0.44 - 1.00 mg/dL   Calcium 8.7 (L) 8.9 - 10.3 mg/dL   Total Protein 6.6 6.5 - 8.1 g/dL   Albumin 3.5 3.5 - 5.0 g/dL   AST 16 15 - 41 U/L   ALT 16 0 - 44 U/L   Alkaline Phosphatase 119 38 - 126 U/L   Total Bilirubin 0.4 0.3 - 1.2 mg/dL   GFR calc non Af Amer >60 >60 mL/min   GFR calc Af Amer >60 >60 mL/min   Anion gap 8 5 - 15    Comment: Performed at [facility], [place] [address], [place], [place] [address]  Hemoglobin A1c     Status: Abnormal   Collection Time: 02/23/19  7:18 AM  Result Value Ref Range   Hgb A1c MFr Bld 6.8 (H) 4.8 - 5.6 %    Comment: (NOTE) Pre diabetes:          5.7%-6.4% Diabetes:              >6.4% Glycemic control for   <7.0% adults with diabetes    Mean Plasma Glucose 148.46 mg/dL    Comment: Performed at [place] [address] [place], [place] [address]  Lipid panel     Status: Abnormal   Collection Time: 02/23/19  7:18 AM  Result Value Ref Range   Cholesterol 203 (H) 0 - 200 mg/dL   Triglycerides 132 <150 mg/dL   HDL 44 >40 mg/dL   Total CHOL/HDL Ratio 4.6 RATIO   VLDL 26 0 - 40 mg/dL   LDL Cholesterol 133 (H) 0 - 99 mg/dL    Comment:        Total Cholesterol/HDL:CHD Risk Coronary Heart Disease Risk Table                     Men   Women  1/2 Average Risk   3.4   3.3  Average Risk       5.0   4.4  2 X Average Risk   9.6   7.1  3 X Average Risk  23.4   11.0        Use the calculated Patient Ratio above and the CHD Risk Table to determine the patient's CHD Risk.        ATP III CLASSIFICATION (LDL):  <100     mg/dL   Optimal  100-129  mg/dL   Near or Above                    Optimal  130-159  mg/dL   Borderline  160-189  mg/dL   High  >190     mg/dL   Very High Performed at [place] [address], [place], [place] [address]   TSH     Status: None   Collection Time: 02/23/19  7:18 AM  Result Value Ref Range   TSH 2.861 0.350 - 4.500 uIU/mL    Comment: Performed by a 3rd Generation assay with a functional sensitivity of <=0.01 uIU/mL. Performed at [facility], [place] [address] [place], [place] [address]     Blood Alcohol level:  Lab Results  Component Value Date   ETH <10 02/23/2019   ETH <10 00/86/7619    Metabolic Disorder Labs: Lab Results  Component Value Date   HGBA1C 6.8 (H) 02/23/2019   MPG 148.46 02/23/2019   MPG 143 (H) 08/30/2012   No results found for: PROLACTIN Lab  Results  Component Value Date   CHOL 203 (H) 02/23/2019   TRIG 132 02/23/2019   HDL 44 02/23/2019   CHOLHDL 4.6 02/23/2019   VLDL 26 02/23/2019   LDLCALC 133 (H) 02/23/2019   LDLCALC 150 (H) 07/05/2015    Physical Findings: AIMS: Facial and Oral Movements Muscles of Facial Expression: None, normal Lips and Perioral Area: None, normal Jaw: None, normal Tongue: None, normal,Extremity Movements Upper (arms, wrists, hands, fingers): None, normal Lower (legs, knees, ankles, toes): None, normal, Trunk Movements Neck, shoulders, hips: None, normal, Overall Severity Severity of abnormal movements (highest score from questions above): None, normal Incapacitation due to abnormal movements: None,  normal Patient's awareness of abnormal movements (rate only patient's report): No Awareness, Dental Status Current problems with teeth and/or dentures?: No Does patient usually wear dentures?: No  CIWA:  CIWA-Ar Total: 1 COWS:  COWS Total Score: 1  Musculoskeletal: Strength & Muscle Tone: within normal limits Gait & Station: UTA since patient is lying in bed. Patient leans: N/A  Psychiatric Specialty Exam: Physical Exam  Nursing note and vitals reviewed. Constitutional: She is oriented to person, place, and time. She appears well-developed and well-nourished.  HENT:  Head: Normocephalic and atraumatic.  Neck: Normal range of motion.  Respiratory: Effort normal.  Musculoskeletal: Normal range of motion.  Neurological: She is alert and oriented to person, place, and time.  Psychiatric: Her speech is normal and behavior is normal. Judgment and thought content normal. Her mood appears anxious. Cognition and memory are normal.    Review of Systems  Psychiatric/Behavioral: Negative for hallucinations, substance abuse and suicidal ideas. The patient is nervous/anxious.   All other systems reviewed and are negative.   Blood pressure 109/82, pulse (!) 108, temperature 97.8 F (36.6 C),  temperature source Oral, resp. rate 20, last menstrual period 10/29/2011, SpO2 100 %.There is no height or weight on file to calculate BMI.  General Appearance: Fairly Groomed, middle aged, African American female, wearing paper hospital scrubs who is lying in bed. NAD.  Eye Contact:  Good  Speech:  Clear and Coherent and Normal Rate  Volume:  Normal  Mood:  Anxious  Affect:  Congruent  Thought Process:  Goal Directed, Linear and Descriptions of Associations: Intact  Orientation:  Full (Time, Place, and Person)  Thought Content:  Delusions and Paranoid Ideation  Suicidal Thoughts:  No  Homicidal Thoughts:  No  Memory:  Immediate;   Good Recent;   Good Remote;   Good  Judgement:  Impaired  Insight:  Shallow  Psychomotor Activity:  Normal  Concentration:  Concentration: Good and Attention Span: Good  Recall:  Good  Fund of Knowledge:  Good  Language:  Good  Akathisia:  No  Handed:  Right  AIMS (if indicated):   N/A  Assets:  Communication Skills Desire for Improvement Housing Physical Health Resilience Social Support  ADL's:  Intact  Cognition:  Impaired due to psychiatric condition.   Sleep:   Fair   Assessment:  [name] is a 53 y.o. female who was admitted with psychosis. She endorses thoughts to harm others due to delusions (uknown if specific person actually exists). She warrants inpatient psychiatric hospitalization for stabilization and treatment.   Treatment Plan Summary: Daily contact with patient to assess and evaluate symptoms and progress in treatment and Medication management  Faythe Dingwall, DO 02/23/2019, 11:18 AM

## 2019-02-23 NOTE — Progress Notes (Signed)
Patient ID: [name], female   DOB: 11/17/1965, 53 y.o.   MRN: [id number]  D: Pt ate breakfast and had her labs drawn this morning. Pt was guarded and paranoid and stated that she doesn't want the hospital to have her blood, and she doesn't know if she can give up her blood. Pt was calm and cooperative. Pt is asleep in her bed with even and unlabored respirations. No discomfort or distress noted.   A: Education, support and encouragement provided, q15 minute safety checks remain in effect.  R: Pt denies any concerns and verbally contracts for safety. Pt ambulating on the unit with no issues. Pt remains safe on the unit.

## 2019-02-23 NOTE — Progress Notes (Signed)
Patient was admitted to the adult unit with ease, patient willingly sat through admission process and answered all questions. Skin search was performed with Selinda Eon, LPN. Patient denies any questions or concerns.  Safety is maintained with 15 minute checks as well as environmental checks. Will continue to monitor throughout the shift.

## 2019-02-23 NOTE — Progress Notes (Signed)
Patient ID: [name], female   DOB: Dec 12, 1965, 53 y.o.   MRN: [id number]  Pt alert and oriented on the unit. Pt transferred to [facility] in-patient unit with admission RN, Alyssa. Pt's  belongings sheet signed and sent with pt. Pt ambulatory with no concerns at this time.

## 2019-02-23 NOTE — BH Assessment (Signed)
Patient accepted to [facility] by Delphia Grates, NP. The attending providing is Dr. Sheppard Evens. The room assignment is 506-1.

## 2019-02-23 NOTE — Tx Team (Signed)
Initial Treatment Plan 02/23/2019 3:08 PM [name] SPZ:[id number]    PATIENT STRESSORS: Health problems Medication change or noncompliance   PATIENT STRENGTHS: Average or above average intelligence General fund of knowledge   PATIENT IDENTIFIED PROBLEMS: "Psychosis"  "Paranoia"                   DISCHARGE CRITERIA:  Ability to meet basic life and health needs Adequate post-discharge living arrangements Improved stabilization in mood, thinking, and/or behavior  PRELIMINARY DISCHARGE PLAN: Outpatient therapy Return to previous work or school arrangements  PATIENT/FAMILY INVOLVEMENT: This treatment plan has been presented to and reviewed with the patient, [name]. The patient and family have been given the opportunity to ask questions and make suggestions.  Megan Mans, RN 02/23/2019, 3:08 PM

## 2019-02-23 NOTE — Progress Notes (Signed)
D: Pt denies SI/HI/AVH. Pt continues to be obsessed about the cyber stalker. And ex that got married A: Pt was offered support and encouragement.  Pt was encourage to attend groups. Q 15 minute checks were done for safety.  R: safety maintained on unit.

## 2019-02-23 NOTE — Progress Notes (Signed)
Patient ID: [name], female   DOB: July 30, 1966, 53 y.o.   MRN: [id number]  [place] NOVEL CORONAVIRUS (COVID-19) DAILY CHECK-OFF SYMPTOMS - answer yes or no to each - every day NO YES  Have you had a fever in the past 24 hours?  . Fever (Temp > 37.80C / 100F) X   Have you had any of these symptoms in the past 24 hours? . New Cough .  Sore Throat  .  Shortness of Breath .  Difficulty Breathing .  Unexplained Body Aches   X   Have you had any one of these symptoms in the past 24 hours not related to allergies?   . Runny Nose .  Nasal Congestion .  Sneezing   X   If you have had runny nose, nasal congestion, sneezing in the past 24 hours, has it worsened?  X   EXPOSURES - check yes or no X   Have you traveled outside the state in the past 14 days?  X   Have you been in contact with someone with a confirmed diagnosis of COVID-19 or PUI in the past 14 days without wearing appropriate PPE?  X   Have you been living in the same home as a person with confirmed diagnosis of COVID-19 or a PUI (household contact)?    X   Have you been diagnosed with COVID-19?    X              What to do next: Answered NO to all: Answered YES to anything:   Proceed with unit schedule Follow the [facility] Inpatient Flowsheet.

## 2019-02-24 DIAGNOSIS — F200 Paranoid schizophrenia: Secondary | ICD-10-CM

## 2019-02-24 DIAGNOSIS — F22 Delusional disorders: Secondary | ICD-10-CM

## 2019-02-24 MED ORDER — ALUM & MAG HYDROXIDE-SIMETH 200-200-20 MG/5ML PO SUSP: 30.0000 mL | ORAL | Status: DC | PRN

## 2019-02-24 MED ORDER — FLUPHENAZINE HCL 5 MG PO TABS
10.0000 mg | ORAL_TABLET | Freq: Two times a day (BID) | ORAL | Status: DC
  Administered 2019-02-25: 09:00:00 10 mg via ORAL
  Filled 2019-02-24 (×5): qty 2

## 2019-02-24 MED ORDER — ACETAMINOPHEN 325 MG PO TABS
650.0000 mg | ORAL_TABLET | Freq: Four times a day (QID) | ORAL | Status: DC | PRN
  Administered 2019-02-24: 650 mg via ORAL
  Filled 2019-02-24: qty 2

## 2019-02-24 MED ORDER — MAGNESIUM HYDROXIDE 400 MG/5ML PO SUSP: 30.0000 mL | Freq: Every day | ORAL | Status: DC | PRN

## 2019-02-24 MED ORDER — TEMAZEPAM 30 MG PO CAPS
30.0000 mg | ORAL_CAPSULE | Freq: Every day | ORAL | Status: DC
  Filled 2019-02-24: qty 5

## 2019-02-24 MED ORDER — BENZTROPINE MESYLATE 0.5 MG PO TABS
0.5000 mg | ORAL_TABLET | Freq: Two times a day (BID) | ORAL | Status: DC
  Administered 2019-02-24 – 2019-02-25 (×2): 0.5 mg via ORAL
  Filled 2019-02-24 (×6): qty 1

## 2019-02-24 NOTE — [facility] Group Notes (Signed)
[place] LCSW Group Therapy Note  Date/Time: 02/24/19, 1315  Type of Therapy/Topic:  Group Therapy:  Balance in Life  Participation Level:  Did not attend  Description of Group:    This group will address the concept of balance and how it feels and looks when one is unbalanced. Patients will be encouraged to process areas in their lives that are out of balance, and identify reasons for remaining unbalanced. Facilitators will guide patients utilizing problem- solving interventions to address and correct the stressor making their life unbalanced. Understanding and applying boundaries will be explored and addressed for obtaining  and maintaining a balanced life. Patients will be encouraged to explore ways to assertively make their unbalanced needs known to significant others in their lives, using other group members and facilitator for support and feedback.  Therapeutic Goals: 1. Patient will identify two or more emotions or situations they have that consume much of in their lives. 2. Patient will identify signs/triggers that life has become out of balance:  3. Patient will identify two ways to set boundaries in order to achieve balance in their lives:  4. Patient will demonstrate ability to communicate their needs through discussion and/or role plays  Summary of Patient Progress:          Therapeutic Modalities:   Cognitive Behavioral Therapy Solution-Focused Therapy Assertiveness Training  Lurline Idol, LCSW
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
## 2019-02-24 NOTE — Progress Notes (Signed)
Pt did not attend orientation/goals group this morning.

## 2019-02-24 NOTE — BHH Suicide Risk Assessment (Signed)
[facility] Admission Suicide Risk Assessment   Nursing information obtained from:  Patient Demographic factors:  NA Current Mental Status:  NA Loss Factors:  Decline in physical health Historical Factors:  Prior suicide attempts, Impulsivity Risk Reduction Factors:  Sense of responsibility to family, Positive social support  Total Time spent with patient: 45 minutes Principal Problem: Schizophrenia ([place]) Diagnosis:  Principal Problem:   Schizophrenia ([place]) Active Problems:   Delusional disorder ([place])  Subjective Data: Patient very focused on her delusional belief system  Continued Clinical Symptoms:  Alcohol Use Disorder Identification Test Final Score (AUDIT): 0 The "Alcohol Use Disorders Identification Test", Guidelines for Use in Primary Care, Second Edition.  World [facility]). Score between 0-7:  no or low risk or alcohol related problems. Score between 8-15:  moderate risk of alcohol related problems. Score between 16-19:  high risk of alcohol related problems. Score 20 or above:  warrants further diagnostic evaluation for alcohol dependence and treatment.   CLINICAL FACTORS:   Schizophrenia:   Paranoid or undifferentiated type      COGNITIVE FEATURES THAT CONTRIBUTE TO RISK:  Loss of executive function    SUICIDE RISK:   Minimal: No identifiable suicidal ideation.  Patients presenting with no risk factors but with morbid ruminations; may be classified as minimal risk based on the severity of the depressive symptoms  PLAN OF CARE: Admit for stabilization engage in reality based therapies  I certify that inpatient services furnished can reasonably be expected to improve the patient's condition.   Johnn Hai, MD 02/24/2019, 1:11 PM
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
## 2019-02-24 NOTE — Progress Notes (Addendum)
CSW spoke with Orlean Bradford at [facility]: after pt was discharged from [facility] with referral for CST last fall, her CST team never located pt or was able to provide services.  She will talk with CST team lead about making another attempt to serve pt. CSW spoke with Adreka from [facility]: pt did not show up for follow up appt there either. Winferd Humphrey, MSW, LCSW Clinical Social Worker 02/24/2019 2:59 PM   CSW received call back from [employer].  CST team willing to attempt to work with pt again--she asked that referral be sent over. Winferd Humphrey, MSW, LCSW Clinical Social Worker 02/25/2019 9:59 AM

## 2019-02-24 NOTE — BHH Suicide Risk Assessment (Signed)
[place] INPATIENT:  Family/Significant Other Suicide Prevention Education  Suicide Prevention Education:  Patient Refusal for Family/Significant Other Suicide Prevention Education: The patient [name] has refused to provide written consent for family/significant other to be provided Family/Significant Other Suicide Prevention Education during admission and/or prior to discharge.  Physician notified.  Joanne Chars, LCSW 02/24/2019, 11:03 AM

## 2019-02-24 NOTE — Progress Notes (Signed)
Pt requested to speak with chaplain.  Stated that another patient to which chaplain was speaking mentioned [place] - this precipitated patient thinking about "harrassment charges I brought against [place]"  Chaplain worked to orient patient to unit and re enforce safety on unit.

## 2019-02-24 NOTE — Progress Notes (Signed)
Recreation Therapy Notes  Date: 4.30.20 Time:  1000 Location: 500 Hall Dayroom  Group Topic: Communication, Team Building, Problem Solving  Goal Area(s) Addresses:  Patient will effectively work with peer towards shared goal.  Patient will identify skills used to make activity successful.  Patient will identify how skills used during activity can be used to reach post d/c goals.   Behavioral Response: None  Intervention: STEM Activity  Activity: Landing Pad. In teams patients were given 12 plastic drinking straws and a length of masking tape. Using the materials provided patients were asked to build a landing pad to catch a golf ball dropped from approximately 6 feet in the air.   Education: Education officer, community, Discharge Planning   Education Outcome: Acknowledges education/In group clarification offered/Needs additional education.   Clinical Observations/Feedback: Pt arrived for the last 10 minutes of group.  Pt appeared drowsy.  Pt sat quietly for remainder of group.    [name], LRT/CTRS         [name], July Nickson A 02/24/2019 11:09 AM

## 2019-02-24 NOTE — Progress Notes (Signed)
DAR NOTE: Patient presents with anxious affect and paranoid mood.  Denies suicidal thoughts, auditory and visual hallucinations.  Rates depression at 0, hopelessness at 0, and anxiety at 0.  Maintained on routine safety checks.  Medications given as prescribed.  Support and encouragement offered as needed.  Attended group and participated.  States goal for today is "discharge."  Patient pacing the hallway and demanding to have her own room.  Became agitated when redirected and educated about unit rules.  Patient in the dayroom crying and being hyper religious.  Ativan 2 mg given for agitation.  Patient is safe on the unit.
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
## 2019-02-25 MED ORDER — FLUPHENAZINE HCL 5 MG PO TABS
20.0000 mg | ORAL_TABLET | Freq: Every day | ORAL | Status: DC
  Filled 2019-02-25: qty 4

## 2019-02-25 MED ORDER — HYDROXYZINE HCL 25 MG PO TABS: 25.0000 mg | ORAL_TABLET | Freq: Three times a day (TID) | ORAL | 0 refills | Status: DC | PRN

## 2019-02-25 MED ORDER — TRAZODONE HCL 50 MG PO TABS: 50.0000 mg | ORAL_TABLET | Freq: Every evening | ORAL | 0 refills | Status: DC | PRN

## 2019-02-25 MED ORDER — FLUPHENAZINE HCL 10 MG PO TABS: 20.0000 mg | ORAL_TABLET | Freq: Every day | ORAL | 0 refills | Status: DC

## 2019-02-25 MED ORDER — BENZTROPINE MESYLATE 0.5 MG PO TABS: 0.5000 mg | ORAL_TABLET | Freq: Two times a day (BID) | ORAL | 0 refills | Status: DC

## 2019-02-25 NOTE — Plan of Care (Signed)
Discharge note  Patient verbalizes readiness for discharge. Follow up plan explained, AVS, Transition record and SRA given. Prescriptions and teaching provided. Belongings returned and signed for. Suicide safety plan completed and signed. Patient verbalizes understanding. Patient denies SI/HI and assures this writer she will seek assistance should that change. Patient discharged to lobby where pt stated she had money for a taxi to take her to her car; [place] police [place].  Problem: Education: Goal: Knowledge of [facility] Education information/materials will improve Outcome: Adequate for Discharge Goal: Emotional status will improve Outcome: Adequate for Discharge Goal: Mental status will improve Outcome: Adequate for Discharge Goal: Verbalization of understanding the information provided will improve Outcome: Adequate for Discharge   Problem: Activity: Goal: Interest or engagement in activities will improve Outcome: Adequate for Discharge Goal: Sleeping patterns will improve Outcome: Adequate for Discharge   Problem: Coping: Goal: Ability to verbalize frustrations and anger appropriately will improve Outcome: Adequate for Discharge Goal: Ability to demonstrate self-control will improve Outcome: Adequate for Discharge   Problem: Health Behavior/Discharge Planning: Goal: Identification of resources available to assist in meeting health care needs will improve Outcome: Adequate for Discharge Goal: Compliance with treatment plan for underlying cause of condition will improve Outcome: Adequate for Discharge   Problem: Physical Regulation: Goal: Ability to maintain clinical measurements within normal limits will improve Outcome: Adequate for Discharge   Problem: Safety: Goal: Periods of time without injury will increase Outcome: Adequate for Discharge   Problem: Education: Goal: Ability to state activities that reduce stress will improve Outcome: Adequate for  Discharge   Problem: Coping: Goal: Ability to identify and develop effective coping behavior will improve Outcome: Adequate for Discharge   Problem: Self-Concept: Goal: Ability to identify factors that promote anxiety will improve Outcome: Adequate for Discharge Goal: Level of anxiety will decrease Outcome: Adequate for Discharge Goal: Ability to modify response to factors that promote anxiety will improve Outcome: Adequate for Discharge   Problem: Education: Goal: Utilization of techniques to improve thought processes will improve Outcome: Adequate for Discharge Goal: Knowledge of the prescribed therapeutic regimen will improve Outcome: Adequate for Discharge   Problem: Activity: Goal: Interest or engagement in leisure activities will improve Outcome: Adequate for Discharge Goal: Imbalance in normal sleep/wake cycle will improve Outcome: Adequate for Discharge   Problem: Coping: Goal: Coping ability will improve Outcome: Adequate for Discharge Goal: Will verbalize feelings Outcome: Adequate for Discharge   Problem: Health Behavior/Discharge Planning: Goal: Ability to make decisions will improve Outcome: Adequate for Discharge Goal: Compliance with therapeutic regimen will improve Outcome: Adequate for Discharge   Problem: Role Relationship: Goal: Will demonstrate positive changes in social behaviors and relationships Outcome: Adequate for Discharge   Problem: Safety: Goal: Ability to disclose and discuss suicidal ideas will improve Outcome: Adequate for Discharge Goal: Ability to identify and utilize support systems that promote safety will improve Outcome: Adequate for Discharge   Problem: Self-Concept: Goal: Will verbalize positive feelings about self Outcome: Adequate for Discharge Goal: Level of anxiety will decrease Outcome: Adequate for Discharge   Problem: Activity: Goal: Will verbalize the importance of balancing activity with adequate rest  periods Outcome: Adequate for Discharge   Problem: Education: Goal: Will be free of psychotic symptoms Outcome: Adequate for Discharge Goal: Knowledge of the prescribed therapeutic regimen will improve Outcome: Adequate for Discharge   Problem: Coping: Goal: Coping ability will improve Outcome: Adequate for Discharge Goal: Will verbalize feelings Outcome: Adequate for Discharge   Problem: Health Behavior/Discharge Planning: Goal: Compliance with prescribed  medication regimen will improve Outcome: Adequate for Discharge   Problem: Nutritional: Goal: Ability to achieve adequate nutritional intake will improve Outcome: Adequate for Discharge   Problem: Role Relationship: Goal: Ability to communicate needs accurately will improve Outcome: Adequate for Discharge Goal: Ability to interact with others will improve Outcome: Adequate for Discharge   Problem: Safety: Goal: Ability to redirect hostility and anger into socially appropriate behaviors will improve Outcome: Adequate for Discharge Goal: Ability to remain free from injury will improve Outcome: Adequate for Discharge   Problem: Self-Care: Goal: Ability to participate in self-care as condition permits will improve Outcome: Adequate for Discharge   Problem: Self-Concept: Goal: Will verbalize positive feelings about self Outcome: Adequate for Discharge
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
## 2019-02-25 NOTE — Progress Notes (Addendum)
Nursing Progress Note: 7p-7a D: Pt currently presents with a depressed/angry/demanding/labile/paranoid affect and behavior. Pt states "I want aloe. I want to shave. I want home cooked food. I want toilet paper." Interacting minimally with the milieu. Pt reports good sleep during the previous night with current medication regimen.   A: Pt refused medications per providers orders. Pt's labs and vitals were monitored throughout the night. Pt supported emotionally and encouraged to express concerns and questions. Pt educated on medications.  R: Pt's safety ensured with 15 minute and environmental checks. Pt currently denies SI, HI, and AVH. Pt verbally contracts to seek staff if SI,HI, or AVH occurs and to consult with staff before acting on any harmful thoughts. Will continue to monitor.

## 2019-02-25 NOTE — Progress Notes (Signed)
[place] NOVEL CORONAVIRUS (COVID-19) DAILY CHECK-OFF SYMPTOMS - answer yes or no to each - every day NO YES  Have you had a fever in the past 24 hours?  . Fever (Temp > 37.80C / 100F) X   Have you had any of these symptoms in the past 24 hours? . New Cough .  Sore Throat  .  Shortness of Breath .  Difficulty Breathing .  Unexplained Body Aches   X   Have you had any one of these symptoms in the past 24 hours not related to allergies?   . Runny Nose .  Nasal Congestion .  Sneezing   X   If you have had runny nose, nasal congestion, sneezing in the past 24 hours, has it worsened?  X   EXPOSURES - check yes or no X   Have you traveled outside the state in the past 14 days?  X   Have you been in contact with someone with a confirmed diagnosis of COVID-19 or PUI in the past 14 days without wearing appropriate PPE?  X   Have you been living in the same home as a person with confirmed diagnosis of COVID-19 or a PUI (household contact)?    X   Have you been diagnosed with COVID-19?    X              What to do next: Answered NO to all: Answered YES to anything:   Proceed with unit schedule Follow the [facility] Inpatient Flowsheet.   

## 2019-02-25 NOTE — Plan of Care (Signed)
Pt is being discharged.    [name], LRT/CTRS

## 2019-02-25 NOTE — Care Plan (Signed)
We recommend community support team upon discharge.  See discharge summary

## 2019-02-25 NOTE — Tx Team (Signed)
Interdisciplinary Treatment and Diagnostic Plan Update  02/25/2019 Time of Session: 0900 Kamalei Roeder MRN: [id number]  Principal Diagnosis: Schizophrenia [facility])  Secondary Diagnoses: Principal Problem:   Schizophrenia ([place]) Active Problems:   Delusional disorder ([place])   Current Medications:  Current Facility-Administered Medications  Medication Dose Route Frequency Provider Last Rate Last Dose  . acetaminophen (TYLENOL) tablet 650 mg  650 mg Oral Q6H PRN Mordecai Maes, NP   650 mg at 02/24/19 1603  . alum & mag hydroxide-simeth (MAALOX/MYLANTA) 200-200-20 MG/5ML suspension 30 mL  30 mL Oral Q4H PRN Mordecai Maes, NP      . benztropine (COGENTIN) tablet 0.5 mg  0.5 mg Oral BID Johnn Hai, MD   0.5 mg at 02/25/19 0911  . [START ON 02/26/2019] fluPHENAZine (PROLIXIN) tablet 20 mg  20 mg Oral QHS Johnn Hai, MD      . hydrOXYzine (ATARAX/VISTARIL) tablet 25 mg  25 mg Oral TID PRN Rankin, Shuvon B, NP   25 mg at 02/23/19 2223  . magnesium hydroxide (MILK OF MAGNESIA) suspension 30 mL  30 mL Oral Daily PRN Mordecai Maes, NP      . temazepam (RESTORIL) capsule 30 mg  30 mg Oral QHS Johnn Hai, MD      . traZODone (DESYREL) tablet 50 mg  50 mg Oral QHS PRN Rankin, Shuvon B, NP   50 mg at 02/23/19 2223   PTA Medications: No medications prior to admission.    Patient Stressors: Health problems Medication change or noncompliance  Patient Strengths: Average or above average intelligence General fund of knowledge  Treatment Modalities: Medication Management, Group therapy, Case management,  1 to 1 session with clinician, Psychoeducation, Recreational therapy.   Physician Treatment Plan for Primary Diagnosis: Schizophrenia ([place]) Long Term Goal(s): Improvement in symptoms so as ready for discharge Improvement in symptoms so as ready for discharge   Short Term Goals: Ability to maintain clinical measurements within normal limits will improve Ability to maintain clinical  measurements within normal limits will improve  Medication Management: Evaluate patient's response, side effects, and tolerance of medication regimen.  Therapeutic Interventions: 1 to 1 sessions, Unit Group sessions and Medication administration.  Evaluation of Outcomes: Adequate for Discharge  Physician Treatment Plan for Secondary Diagnosis: Principal Problem:   Schizophrenia ([place]) Active Problems:   Delusional disorder ([place])  Long Term Goal(s): Improvement in symptoms so as ready for discharge Improvement in symptoms so as ready for discharge   Short Term Goals: Ability to maintain clinical measurements within normal limits will improve Ability to maintain clinical measurements within normal limits will improve     Medication Management: Evaluate patient's response, side effects, and tolerance of medication regimen.  Therapeutic Interventions: 1 to 1 sessions, Unit Group sessions and Medication administration.  Evaluation of Outcomes: Adequate for Discharge   RN Treatment Plan for Primary Diagnosis: Schizophrenia ([place]) Long Term Goal(s): Knowledge of disease and therapeutic regimen to maintain health will improve  Short Term Goals: Ability to participate in decision making will improve, Ability to verbalize feelings will improve, Ability to disclose and discuss suicidal ideas, Ability to identify and develop effective coping behaviors will improve and Compliance with prescribed medications will improve  Medication Management: RN will administer medications as ordered by provider, will assess and evaluate patient's response and provide education to patient for prescribed medication. RN will report any adverse and/or side effects to prescribing provider.  Therapeutic Interventions: 1 on 1 counseling sessions, Psychoeducation, Medication administration, Evaluate responses to treatment, Monitor vital signs and CBGs as  ordered, Perform/monitor CIWA, COWS, AIMS and Fall Risk screenings  as ordered, Perform wound care treatments as ordered.  Evaluation of Outcomes: Adequate for Discharge   LCSW Treatment Plan for Primary Diagnosis: Schizophrenia [facility]) Long Term Goal(s): Safe transition to appropriate next level of care at discharge, Engage patient in therapeutic group addressing interpersonal concerns.  Short Term Goals: Engage patient in aftercare planning with referrals and resources and Increase skills for wellness and recovery  Therapeutic Interventions: Assess for all discharge needs, 1 to 1 time with Social worker, Explore available resources and support systems, Assess for adequacy in community support network, Educate family and significant other(s) on suicide prevention, Complete Psychosocial Assessment, Interpersonal group therapy.  Evaluation of Outcomes: Adequate for Discharge   Progress in Treatment: Attending groups: Yes. Participating in groups: No. Taking medication as prescribed: Yes. Toleration medication: Yes. Family/Significant other contact made: No, will contact:  pt denies Patient understands diagnosis: Yes. Discussing patient identified problems/goals with staff: Yes. Medical problems stabilized or resolved: Yes. Denies suicidal/homicidal ideation: Yes. Issues/concerns per patient self-inventory: No. Other:   New problem(s) identified: No, Describe:  None  New Short Term/Long Term Goal(s):  Patient Goals:  "Get meds to help me sleep at night"   Discharge Plan or Barriers:   Reason for Continuation of Hospitalization: Aggression Depression Hallucinations Medication stabilization  Estimated Length of Stay: Discharging today  Attendees: Patient: [name] 02/25/2019   Physician: Johnn Hai, MD 02/25/2019   Nursing: Legrand Como, RN 02/25/2019   RN Care Manager: 02/25/2019   Social Worker: Winferd Humphrey, LCSW 02/25/2019   Recreational Therapist:  02/25/2019   Other: Ardelle Anton, LCSW 02/25/2019   Other:  02/25/2019   Other: 02/25/2019        Scribe for Treatment Team: Trecia Rogers, LCSW 02/25/2019 10:00 AM

## 2019-02-25 NOTE — BHH Group Notes (Signed)
  [place] LCSW Group Therapy Note  Date/Time: 02/25/19, 1310  Type of Therapy/Topic:  Group Therapy:  Emotion Regulation  Participation Level:  Did Not Attend   Mood:  Description of Group:    The purpose of this group is to assist patients in learning to regulate negative emotions and experience positive emotions. Patients will be guided to discuss ways in which they have been vulnerable to their negative emotions. These vulnerabilities will be juxtaposed with experiences of positive emotions or situations, and patients challenged to use positive emotions to combat negative ones. Special emphasis will be placed on coping with negative emotions in conflict situations, and patients will process healthy conflict resolution skills.  Therapeutic Goals: 1. Patient will identify two positive emotions or experiences to reflect on in order to balance out negative emotions:  2. Patient will label two or more emotions that they find the most difficult to experience:  3. Patient will be able to demonstrate positive conflict resolution skills through discussion or role plays:   Summary of Patient Progress:       Therapeutic Modalities:   Cognitive Behavioral Therapy Feelings Identification Dialectical Behavioral Therapy  Lurline Idol, LCSW

## 2019-02-25 NOTE — BHH Suicide Risk Assessment (Signed)
[facility] Discharge Suicide Risk Assessment   Principal Problem: Schizophrenia [facility]) Discharge Diagnoses: Principal Problem:   Schizophrenia ([place]) Active Problems:   Delusional disorder ([place])   Total Time spent with patient: 45 minutes  Patient's case should be understood as follows that she will probably always have fixed and paranoid/persecutory delusions however she is not going to act on them there is less delusional intensity and conviction at this point in time she is not having thoughts of harming self or others contracting fully  Mental Status Per Nursing Assessment::   On Admission:  NA  Demographic Factors:  Unemployed  Loss Factors: NA  Historical Factors: NA  Risk Reduction Factors:   NA  Continued Clinical Symptoms:  Schizophrenia:   Less than 15 years old  Cognitive Features That Contribute To Risk:  Loss of executive function    Suicide Risk:  Minimal: No identifiable suicidal ideation.  Patients presenting with no risk factors but with morbid ruminations; may be classified as minimal risk based on the severity of the depressive symptoms  Follow-up [facility] Follow up on 03/10/2019.   Why:  Medication management appointment with Dr. Altamese [place] is Thursday, 5/14 at 2:00p.  Please bring your current medications and discharge paperwork from this hospitalization.  Contact information: [id number] [place] [address] Ph: [phone] Fx: [phone]          Plan Of Care/Follow-up recommendations:  Activity:  full  Keyla Milone, MD 02/25/2019, 9:39 AM

## 2019-02-25 NOTE — Progress Notes (Signed)
Recreation Therapy Notes  INPATIENT RECREATION TR PLAN  Patient Details Name: Jamicia Haaland MRN: [id number] DOB: [birth year] Today's Date: 02/25/2019  Rec Therapy Plan Is patient appropriate for Therapeutic Recreation?: Yes Treatment times per week: about 3 days Estimated Length of Stay: 5-7 days TR Treatment/Interventions: Group participation (Comment)  Discharge Criteria Pt will be discharged from therapy if:: Discharged Treatment plan/goals/alternatives discussed and agreed upon by:: Patient/family  Discharge Summary Short term goals set: See patient care plan Short term goals met: Not met Progress toward goals comments: (Pt did not attend groups.) Reason goals not met: Pt is being discharged. Therapeutic equipment acquired: N/A Reason patient discharged from therapy: Discharge from hospital Pt/family agrees with progress & goals achieved: Yes Date patient discharged from therapy: 02/25/19     [name], LRT/CTRS  [name], [place] 02/25/2019, 1:40 PM

## 2019-02-25 NOTE — Progress Notes (Signed)
  [facility] Adult Case Management Discharge Plan :  Will you be returning to the same living situation after discharge:  Yes,  own home At discharge, do you have transportation home?: No. Pt will take cab to pick up her car at the police station.  Do you have the ability to pay for your medications: Yes,  medicare  Release of information consent forms completed and in the chart;  Patient's signature needed at discharge.  Patient to Follow up at: Follow-up Information    [facility] Follow up on 03/10/2019.   Why:  Medication management appointment with Dr. Altamese [place] is Thursday, 5/14 at 2:00p.  Please bring your current medications and discharge paperwork from this hospitalization.  Contact information: [id number] [place] [address] Ph: [phone] Fx: [phone]       [place]. Call in 1 day(s).   Why:  PSI will call your for intake appt for [facility] Support team.  Please call them as well and ask for Orlean Bradford or another staff member from the [employer] team. Contact information: [address] [place] [address] [phone]           Next level of care provider has access to [place] and Suicide Prevention discussed: No. Pt denied consent.   Have you used any form of tobacco in the last 30 days? (Cigarettes, Smokeless Tobacco, Cigars, and/or Pipes): No  Has patient been referred to the Quitline?: N/A patient is not a smoker  Patient has been referred for addiction treatment: N/A  Joanne Chars, [place] 02/25/2019, 2:42 PM

## 2019-02-25 NOTE — Progress Notes (Signed)
Recreation Therapy Notes  Date: 5.1.20 Time: 1000 Location: 500 Hall Dayroom  Group Topic: Communication  Goal Area(s) Addresses:  Patient will effectively communicate with peers in group.  Patient will verbalize benefit of healthy communication. Patient will verbalize positive effect of healthy communication on post d/c goals.  Patient will identify communication techniques that made activity effective for group.   Intervention:  Plain paper, pencils, drawings   Activity: Geometrical Drawings.  Patients took turns describing a geometrical drawing to the group.  The speaker could describe the picture in as much detail as needed.  The patients who were drawing could only ask the speaker to repeat themselves.  They could not ask any detailed questions.  At the completion of each drawing, patients would compare their drawing to the original picture.  Education: Communication, Discharge Planning  Education Outcome: Acknowledges understanding/In group clarification offered/Needs additional education.   Clinical Observations/Feedback: Pt did not attend group.    [name], LRT/CTRS      [name] A 02/25/2019 12:30 PM

## 2019-04-04 ENCOUNTER — Emergency Department (HOSPITAL_COMMUNITY): Payer: Medicare Other

## 2019-04-04 ENCOUNTER — Other Ambulatory Visit: Payer: Self-pay

## 2019-04-04 ENCOUNTER — Emergency Department (HOSPITAL_COMMUNITY)
Admission: EM | Admit: 2019-04-04 | Discharge: 2019-04-05 | Disposition: A | Discharge status: Home / Self Care | Payer: Medicare Other | Attending: Emergency Medicine | Admitting: Emergency Medicine

## 2019-04-04 ENCOUNTER — Telehealth (HOSPITAL_COMMUNITY): Payer: Self-pay | Admitting: Emergency Medicine

## 2019-04-04 ENCOUNTER — Encounter (HOSPITAL_COMMUNITY): Payer: Self-pay | Admitting: Emergency Medicine

## 2019-04-04 DIAGNOSIS — E119 Type 2 diabetes mellitus without complications: Secondary | ICD-10-CM | POA: Diagnosis not present

## 2019-04-04 DIAGNOSIS — Z79899 Other long term (current) drug therapy: Secondary | ICD-10-CM | POA: Diagnosis not present

## 2019-04-04 DIAGNOSIS — I10 Essential (primary) hypertension: Secondary | ICD-10-CM | POA: Insufficient documentation

## 2019-04-04 DIAGNOSIS — F22 Delusional disorders: Secondary | ICD-10-CM | POA: Insufficient documentation

## 2019-04-04 DIAGNOSIS — R42 Dizziness and giddiness: Secondary | ICD-10-CM | POA: Diagnosis present

## 2019-04-04 DIAGNOSIS — R220 Localized swelling, mass and lump, head: Secondary | ICD-10-CM | POA: Diagnosis not present

## 2019-04-04 LAB — PROTIME-INR
INR: 1 (ref 0.8–1.2)
Prothrombin Time: 13.1 seconds (ref 11.4–15.2)

## 2019-04-04 LAB — APTT: aPTT: 29 seconds (ref 24–36)

## 2019-04-04 LAB — DIFFERENTIAL
Abs Immature Granulocytes: 0.02 10*3/uL (ref 0.00–0.07)
Basophils Absolute: 0 10*3/uL (ref 0.0–0.1)
Basophils Relative: 0 %
Eosinophils Absolute: 0.2 10*3/uL (ref 0.0–0.5)
Eosinophils Relative: 3 %
Immature Granulocytes: 0 %
Lymphocytes Relative: 42 %
Lymphs Abs: 2.5 10*3/uL (ref 0.7–4.0)
Monocytes Absolute: 0.3 10*3/uL (ref 0.1–1.0)
Monocytes Relative: 4 %
Neutro Abs: 3.1 10*3/uL (ref 1.7–7.7)
Neutrophils Relative %: 51 %

## 2019-04-04 LAB — CBC
HCT: 43.5 % (ref 36.0–46.0)
Hemoglobin: 13.3 g/dL (ref 12.0–15.0)
MCH: 25 pg — ABNORMAL LOW (ref 26.0–34.0)
MCHC: 30.6 g/dL (ref 30.0–36.0)
MCV: 81.6 fL (ref 80.0–100.0)
Platelets: 295 10*3/uL (ref 150–400)
RBC: 5.33 MIL/uL — ABNORMAL HIGH (ref 3.87–5.11)
RDW: 15.2 % (ref 11.5–15.5)
WBC: 6.1 10*3/uL (ref 4.0–10.5)
nRBC: 0 % (ref 0.0–0.2)

## 2019-04-04 LAB — COMPREHENSIVE METABOLIC PANEL
ALT: 38 U/L (ref 0–44)
AST: 38 U/L (ref 15–41)
Albumin: 4.3 g/dL (ref 3.5–5.0)
Alkaline Phosphatase: 141 U/L — ABNORMAL HIGH (ref 38–126)
Anion gap: 11 (ref 5–15)
BUN: 13 mg/dL (ref 6–20)
CO2: 20 mmol/L — ABNORMAL LOW (ref 22–32)
Calcium: 9.5 mg/dL (ref 8.9–10.3)
Chloride: 108 mmol/L (ref 98–111)
Creatinine, Ser: 0.96 mg/dL (ref 0.44–1.00)
GFR calc Af Amer: >60 mL/min (ref >60)
GFR calc non Af Amer: >60 mL/min (ref >60)
Glucose, Bld: 105 mg/dL — ABNORMAL HIGH (ref 70–99)
Potassium: 4.5 mmol/L (ref 3.5–5.1)
Sodium: 139 mmol/L (ref 135–145)
Total Bilirubin: 0.3 mg/dL (ref 0.3–1.2)
Total Protein: 7.7 g/dL (ref 6.5–8.1)

## 2019-04-04 LAB — I-STAT BETA HCG BLOOD, ED (MC, WL, AP ONLY): I-stat hCG, quantitative: <5 m[IU]/mL (ref <5)

## 2019-04-04 LAB — I-STAT CHEM 8, ED
BUN: 13 mg/dL (ref 6–20)
Calcium, Ion: 1.18 mmol/L (ref 1.15–1.40)
Chloride: 109 mmol/L (ref 98–111)
Creatinine, Ser: 0.8 mg/dL (ref 0.44–1.00)
Glucose, Bld: 102 mg/dL — ABNORMAL HIGH (ref 70–99)
HCT: 42 % (ref 36.0–46.0)
Hemoglobin: 14.3 g/dL (ref 12.0–15.0)
Potassium: 4.2 mmol/L (ref 3.5–5.1)
Sodium: 140 mmol/L (ref 135–145)
TCO2: 22 mmol/L (ref 22–32)

## 2019-04-04 NOTE — ED Triage Notes (Signed)
Pt presents with what she states is a "bump" on her forehead that is causing her pain and "strok-ish" symptoms. Pt states her right eye is "twitching" Neuro exam in triage intact.

## 2019-04-04 NOTE — Telephone Encounter (Signed)
Late Entry:  Patient arrived at [facility] by private vehicle, patient states driving herself.  Patient standing, appears off balance, holding onto equipment, chairs to steady self.  Patient states she is having "mini strokes" in the side of her face ( patient touches right side of face).  Patient says she has had strokes before.  Patient is vague when asked when this started.  Patient states this evening, but will not give a time.    Grips equal and strong, balance is off.   Discussed patient with dr Meda Coffee.  Patient to go to ED.  Patient was taken to ed by wheelchair and handed off to ED staff

## 2019-04-05 MED ORDER — BENZTROPINE MESYLATE 1 MG PO TABS: 0.5000 mg | ORAL_TABLET | Freq: Two times a day (BID) | ORAL | Status: DC

## 2019-04-05 MED ORDER — FLUPHENAZINE HCL 10 MG PO TABS: 20.0000 mg | ORAL_TABLET | Freq: Every day | ORAL | Status: DC

## 2019-04-05 NOTE — ED Notes (Signed)
Pt talks in circles. Pt has paranoid thoughts. Says cyber bully is giving her a stroke. Says cyber bullies cancelled her MRI last year.

## 2019-04-05 NOTE — ED Notes (Signed)
Pt. Called out upset a TTS machine was placed in room. Pt states "im livid" attempted to explain pt. Choice to take call. Pt has choice to refuse call. Pt. Explained and MRI has been ordered due to her concerns of stroke. Dr. Wyvonnia Dusky informed. Pt refused to wait in room and continued to walk out. Primary RN Threasa Beards informed

## 2019-04-05 NOTE — ED Notes (Signed)
Per Janace Hoard, RN, patient left AMA. TTS consult no longer needed.

## 2019-04-05 NOTE — ED Provider Notes (Signed)
[facility] EMERGENCY DEPARTMENT Provider Note   CSN: [id number] Arrival date & time: 04/04/19  1924    History   Chief Complaint Chief Complaint  Patient presents with  . Dizziness    HPI [name] is a 53 y.o. female.     Level 5 caveat for psychiatric illness.  Patient is difficult historian.  She states she is here because of "I filed harassment charges in 2006 on Marlene Bast from [place]".  States she is having continued "cyber bullying" that are causing her to have "mini strokes".  She continues to give the same story that she was seen at [facility] for this last year and was referred to [facility] neurology but was "magically given the wrong discharge instructions".  Chart review shows history of schizophrenia and delusional disorder but when this was addressed with the patient she denies this and states this needs to be taken out of her chart.  She denies taking medications.  States she has 2 masters degrees and is in the PhD program.  She states her right forehead is protruding and causing her to have intermittent pain and eye twitching as well as "mini strokes" due to "cyber bullying".  She denies hearing any voices.  She denies any suicidal homicidal thoughts.  States she has no medical problems and takes no medications.  Admits to some wine use nightly but denies any drug use.  She apparently drove herself to the urgent care and then was wheeled down to the ED after she was witnessed having trouble walking and not making sense and acting bizarrely. She denies suicidal or homicidal thoughts.   The history is provided by the patient.  Dizziness    Past Medical History:  Diagnosis Date  . Anxiety   . Diabetes mellitus without complication ([place])   . Hypertension   . Major depressive disorder, recurrent, severe with psychotic features [facility])     Patient Active Problem List   Diagnosis Date Noted  . Major depressive disorder, recurrent episode, severe, with  psychosis ([place])   . Schizophrenia ([place]) 03/26/2018  . Delusional disorder ([place]) 01/07/2018  . Anxiety 03/29/2016  . Homelessness 11/18/2015  . Tobacco use disorder 07/05/2015  . Hypokalemia 05/30/2015  . Obstipation 05/30/2015  . Prolonged QT interval 05/30/2015  . Hypertension 05/30/2015  . Suicidal ideations 03/12/2015  . Diabetes ([place]) 09/06/2012  . Paranoia ([place]) 04/16/2012    History reviewed. No pertinent surgical history.   OB History   No obstetric history on file.      Home Medications    Prior to Admission medications   Medication Sig Start Date End Date Taking? Authorizing Provider  benztropine (COGENTIN) 0.5 MG tablet Take 1 tablet (0.5 mg total) by mouth 2 (two) times daily. 02/25/19   Connye Burkitt, NP  fluPHENAZine (PROLIXIN) 10 MG tablet Take 2 tablets (20 mg total) by mouth at bedtime. 02/26/19   Connye Burkitt, NP  hydrOXYzine (ATARAX/VISTARIL) 25 MG tablet Take 1 tablet (25 mg total) by mouth 3 (three) times daily as needed for anxiety. 02/25/19   Connye Burkitt, NP  traZODone (DESYREL) 50 MG tablet Take 1 tablet (50 mg total) by mouth at bedtime as needed for sleep. 02/25/19   Connye Burkitt, NP    Family History Family History  Problem Relation Age of Onset  . Hypertension Mother   . Diabetes Mother   . Aneurysm Mother   . Hypertension Father   . Diabetes Father   .  Hypertension Sister   . Diabetes Sister   . Hypertension Other   . Diabetes Other     Social History Social History   Tobacco Use  . Smoking status: Never Smoker  . Smokeless tobacco: Never Used  Substance Use Topics  . Alcohol use: No  . Drug use: No     Allergies   Patient has no known allergies.   Review of Systems Review of Systems  Unable to perform ROS: Psychiatric disorder  Neurological: Positive for dizziness.     Physical Exam Updated Vital Signs BP (!) 146/86 (BP Location: Left Arm)   Pulse 70   Temp 98.3 F (36.8 C) (Oral)   Resp 16   LMP 10/29/2011  Comment: NEG U PREG 02/24/17  SpO2 100%   Physical Exam Vitals signs and nursing note reviewed.  Constitutional:      General: She is not in acute distress.    Appearance: She is well-developed. She is obese.     Comments: Pressured and repetitive speech, oriented x3.  HENT:     Head: Normocephalic and atraumatic.     Mouth/Throat:     Pharynx: No oropharyngeal exudate.  Eyes:     Conjunctiva/sclera: Conjunctivae normal.     Pupils: Pupils are equal, round, and reactive to light.  Neck:     Musculoskeletal: Normal range of motion and neck supple.     Comments: No meningismus. Cardiovascular:     Rate and Rhythm: Normal rate and regular rhythm.     Heart sounds: Normal heart sounds. No murmur.  Pulmonary:     Effort: Pulmonary effort is normal. No respiratory distress.     Breath sounds: Normal breath sounds.  Abdominal:     Palpations: Abdomen is soft.     Tenderness: There is no abdominal tenderness. There is no guarding or rebound.  Musculoskeletal: Normal range of motion.        General: No tenderness.  Skin:    General: Skin is warm.     Capillary Refill: Capillary refill takes less than 2 seconds.  Neurological:     General: No focal deficit present.     Mental Status: She is alert and oriented to person, place, and time.     Cranial Nerves: No cranial nerve deficit.     Motor: No abnormal muscle tone.     Coordination: Coordination normal.     Comments: CN 2-12 intact, no ataxia on finger to nose, no nystagmus, 5/5 strength throughout, no pronator drift, positive Romberg, unsteady gait needing assistance.  No appreciable nystagmus.  No ataxia on finger-to-nose.  5/5 strength throughout, cranial nerves II to XII intact.  Psychiatric:        Behavior: Behavior normal.     Comments: Patient with delusional thoughts and pressured speech and repetitive answers.      ED Treatments / Results  Labs (all labs ordered are listed, but only abnormal results are displayed)  Labs Reviewed  CBC - Abnormal; Notable for the following components:      Result Value   RBC 5.33 (*)    MCH 25.0 (*)    All other components within normal limits  COMPREHENSIVE METABOLIC PANEL - Abnormal; Notable for the following components:   CO2 20 (*)    Glucose, Bld 105 (*)    Alkaline Phosphatase 141 (*)    All other components within normal limits  I-STAT CHEM 8, ED - Abnormal; Notable for the following components:   Glucose, Bld 102 (*)  All other components within normal limits  PROTIME-INR  APTT  DIFFERENTIAL  CBG MONITORING, ED  I-STAT BETA HCG BLOOD, ED (MC, WL, AP ONLY)    EKG EKG Interpretation  Date/Time:  Monday April 04 2019 20:39:12 EDT Ventricular Rate:  71 PR Interval:  158 QRS Duration: 78 QT Interval:  412 QTC Calculation: 447 R Axis:   39 Text Interpretation:  Normal sinus rhythm Cannot rule out Anterior infarct , age undetermined Abnormal ECG No significant change was found Confirmed by Ezequiel Essex [phone]) on 04/05/2019 4:09:09 AM   Radiology Ct Head Wo Contrast  Result Date: 04/04/2019 CLINICAL DATA:  Bump on forehead. EXAM: CT HEAD WITHOUT CONTRAST TECHNIQUE: Contiguous axial images were obtained from the base of the skull through the vertex without intravenous contrast. COMPARISON:  03/02/2017 FINDINGS: Brain: No evidence of acute infarction, hemorrhage, hydrocephalus, extra-axial collection or mass lesion/mass effect. Vascular: No hyperdense vessel or unexpected calcification. Skull: Normal. Negative for fracture or focal lesion. Sinuses/Orbits: No acute finding. Other: None. IMPRESSION: No acute intracranial abnormality. Electronically Signed   By: Constance Holster M.D.   On: 04/04/2019 22:04    Procedures Procedures (including critical care time)  Medications Ordered in ED Medications - No data to display   Initial Impression / Assessment and Plan / ED Course  I have reviewed the triage vital signs and the nursing notes.   Pertinent labs & imaging results that were available during my care of the patient were reviewed by me and considered in my medical decision making (see chart for details).       Patient here because of "harassment and cyber bullying" and believes she is having mini strokes because her right forehead is protruding.  History of paranoid schizophrenia and previous ED visits and [facility] admissions for same.  Her neurological exam is remarkable for positive Romberg and some unsteady gait.  Her CT head is normal.  Labs are unremarkable.  Chart review shows a multiple previous psychiatric hospitalizations in 2019 and 2020 with documentation of paranoid schizophrenia.  Patient seems to have paranoia but does not have any suicidal or homicidal thoughts or hallucinations.  She states she does not want to talk to TTS or the psychiatry team.  She was offered MRI to evaluate her dizzy spells and her concern for "mini strokes".    She became upset with this and decided that she was leaving.  She walked out of the department with a steady gait. She was apparently upset that a TTS machine have been placed in her room.  She got up and stated she was here for medical reasons.  It was attempted to explain to the patient that we were medically evaluating her and were going to order an MRI to evaluate her concerns for stroke.  Discussed that her blood work is reassuring. Discussed that she could refuse to speak to TTS if she desired.  Patient did demonstrate that she had paranoia and some delusions but did not appear to be a threat to herself or anyone else.  Does not meet criteria to IVC her at this point.  She was alert and oriented x3, she denied any suicidal or homicidal thoughts.  She did not appear to responding to internal stimuli on my exam.  She denies hearing any voices. Patient proceeded to walk out of the department without difficulty.  Final Clinical Impressions(s) / ED Diagnoses   Final diagnoses:   Delusions [facility])    ED Discharge Orders    None  Ezequiel Essex, MD 04/05/19 0830

## 2019-04-05 NOTE — ED Notes (Signed)
RN talked to sister says that she is not compliant with her meds

## 2019-04-05 NOTE — ED Notes (Signed)
Pt talking in circles upon assessment. Gives the same story that she filed harrassment and her strokes are d/t cyber bullying.

## 2019-04-26 ENCOUNTER — Encounter: Payer: Self-pay | Admitting: Neurology

## 2019-04-26 ENCOUNTER — Other Ambulatory Visit: Payer: Self-pay

## 2019-04-26 ENCOUNTER — Telehealth (INDEPENDENT_AMBULATORY_CARE_PROVIDER_SITE_OTHER): Payer: Medicare Other | Admitting: Neurology

## 2019-04-26 VITALS — Ht 65.5 in

## 2019-04-26 DIAGNOSIS — R42 Dizziness and giddiness: Secondary | ICD-10-CM

## 2019-04-26 DIAGNOSIS — R200 Anesthesia of skin: Secondary | ICD-10-CM

## 2019-04-26 DIAGNOSIS — H81399 Other peripheral vertigo, unspecified ear: Secondary | ICD-10-CM

## 2019-04-26 DIAGNOSIS — R202 Paresthesia of skin: Secondary | ICD-10-CM
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
## 2019-04-27 ENCOUNTER — Other Ambulatory Visit: Payer: Self-pay | Admitting: Neurology

## 2019-04-27 ENCOUNTER — Telehealth: Payer: Self-pay | Admitting: Neurology

## 2019-04-27 ENCOUNTER — Other Ambulatory Visit: Payer: Self-pay

## 2019-04-27 DIAGNOSIS — H81399 Other peripheral vertigo, unspecified ear: Secondary | ICD-10-CM

## 2019-04-27 DIAGNOSIS — R42 Dizziness and giddiness: Secondary | ICD-10-CM

## 2019-04-27 DIAGNOSIS — R200 Anesthesia of skin: Secondary | ICD-10-CM

## 2019-04-27 MED ORDER — DIAZEPAM 5 MG PO TABS: ORAL_TABLET | ORAL | 0 refills | Status: DC

## 2019-04-27 MED ORDER — GABAPENTIN 100 MG PO CAPS: 100.0000 mg | ORAL_CAPSULE | Freq: Every day | ORAL | 6 refills | Status: DC

## 2019-04-27 NOTE — Telephone Encounter (Signed)
Patient wants a call when the medication is called in and would like this done as soon as possible.

## 2019-04-27 NOTE — Telephone Encounter (Signed)
Patient left msg with after hours about not getting her medication refill for dizziness and headache. Said she did a VV yesterday and was supposed to get medication. She doesn't know the name of the medication. But it is for the [facility] [phone]. Thanks!

## 2019-04-27 NOTE — Telephone Encounter (Signed)
The medication for the headaches and electric-sensation is the gabapentin. Pls send in Rx for gabapentin 100mg , take 1 tab qhs, with 6 refills, thanks! For the valium, it would be 5mg  one dose 97mins prior to MRI, may take second dose if needed, #2 no refills. Thanks!

## 2019-04-27 NOTE — Telephone Encounter (Signed)
Gabapentin and Valium sent as ordered.  MRI order, demo, ins cards faxed to [facility]. They will contact pt with an appt date and time.  Pt informed of all.

## 2019-04-27 NOTE — Telephone Encounter (Addendum)
Do you want me to send in Gabapentin? I seen that in the note.  Also, what medication is the pt referring to about HA and dizziness?   If the pt does want Valium, what are your instructions and quantitiy?  I am working on the open MRI now with [facility].

## 2019-05-04 ENCOUNTER — Telehealth: Payer: Self-pay | Admitting: Neurology
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
## 2019-05-05 NOTE — Telephone Encounter (Signed)
Ok to increase gabapentin to 100mg  BID, ok to take tylenol or aleve as needed. Thanks

## 2019-05-05 NOTE — Telephone Encounter (Signed)
Patient is calling in wanting an increase in her gabapentin. Says that she is feeling like she is going to have a seizure. Wants to take it morning and night. Would you recommend tylenol or aleeve for slight headache. Please call her back. Thanks!

## 2019-05-05 NOTE — Telephone Encounter (Signed)
Pt would like to increase Gabapentin from 100mg  qhs. What do you want to increase it to?  Confirmed with pt that she has not passed out or had a seizure.  Pt states that she wants to rest through the weekend and she will call [facility] next week to schedule MR Brain

## 2019-05-06 ENCOUNTER — Telehealth: Payer: Self-pay

## 2019-05-06 NOTE — Telephone Encounter (Signed)
Error

## 2019-05-06 NOTE — Telephone Encounter (Signed)
Pt informed on voicemail and to call with any concerns.

## 2019-05-17 ENCOUNTER — Telehealth: Payer: Self-pay | Admitting: Neurology

## 2019-05-17 DIAGNOSIS — H81399 Other peripheral vertigo, unspecified ear: Secondary | ICD-10-CM

## 2019-05-17 DIAGNOSIS — R42 Dizziness and giddiness: Secondary | ICD-10-CM

## 2019-05-17 DIAGNOSIS — R200 Anesthesia of skin: Secondary | ICD-10-CM

## 2019-05-17 NOTE — Telephone Encounter (Signed)
MRI/Scan orders need to go to some imaging place in [facility] area; she is having some issues with [facility] and would prefer another place. Please send those. Thanks!

## 2019-05-18 NOTE — Telephone Encounter (Signed)
Spoke to patient and she requested a different Imaging center other than the [facility] where order was placed. She does want an open MRI. Will call and find alternative location.

## 2019-05-19 NOTE — Telephone Encounter (Signed)
Faxed order (transmission confirmed to [phone]) was sent to [facility] in [place] for MRI of brain. Patient called and made aware.

## 2019-05-20 ENCOUNTER — Other Ambulatory Visit: Payer: Self-pay | Admitting: Neurology

## 2019-05-20 DIAGNOSIS — R200 Anesthesia of skin: Secondary | ICD-10-CM

## 2019-05-20 DIAGNOSIS — R202 Paresthesia of skin: Secondary | ICD-10-CM

## 2019-05-20 DIAGNOSIS — H81399 Other peripheral vertigo, unspecified ear: Secondary | ICD-10-CM

## 2019-05-20 DIAGNOSIS — R42 Dizziness and giddiness: Secondary | ICD-10-CM

## 2019-05-20 MED ORDER — GABAPENTIN 100 MG PO CAPS: 100.0000 mg | ORAL_CAPSULE | Freq: Two times a day (BID) | ORAL | 5 refills | Status: DC

## 2019-05-20 NOTE — Telephone Encounter (Signed)
Cameron Sprang, MD to Jamie Dublin, LPN     49:97 PM Note   Ok to increase gabapentin to 100mg  BID, ok to take tylenol or aleve as needed. Thanks     Requested Prescriptions   Pending Prescriptions Disp Refills  . gabapentin (NEURONTIN) 100 MG capsule 30 capsule 6    Sig: Take 1 capsule (100 mg total) by mouth at bedtime.   Rx last filled:04/27/19 #30 6 refills  Pt last seen:04/26/19  Follow up appt scheduled:none  New rx for new dose sent to pharmacy

## 2019-05-20 NOTE — Telephone Encounter (Signed)
Patient is calling in about her gabapentin medication. She is needing a new prescription for the 2 pills per day. She needs it sent to the [name] in [place] @ phone: [phone]. Thanks!

## 2019-05-23 ENCOUNTER — Telehealth: Payer: Self-pay | Admitting: Neurology

## 2019-05-23 NOTE — Telephone Encounter (Signed)
Patient is needing a new prescription for the gabapentin to the [facility] [address] with the dosage change. Taking pill x2 a day instead of 1.Thanks!

## 2019-05-23 NOTE — Telephone Encounter (Signed)
Script was called in for patient's Gabapentin on 7/24 to the [facility] on [address] in [place]. Regarding message left returned patient's call and she requested it be sent to the Kristopher Oppenheim at [facility]. Called the [facility] where med sent and they said that patient just needs to call where she wants to pick it up and they can request a transfer. Spoke to patient and informed her of above and she will call her desired pharmacy.

## 2019-05-25 ENCOUNTER — Encounter (HOSPITAL_COMMUNITY): Payer: Self-pay | Admitting: *Deleted

## 2019-05-25 ENCOUNTER — Emergency Department (HOSPITAL_COMMUNITY)
Admission: EM | Admit: 2019-05-25 | Discharge: 2019-05-25 | Disposition: A | Discharge status: Home / Self Care | Payer: Medicare Other | Attending: Emergency Medicine | Admitting: Emergency Medicine

## 2019-05-25 ENCOUNTER — Other Ambulatory Visit: Payer: Self-pay

## 2019-05-25 DIAGNOSIS — R9431 Abnormal electrocardiogram [ECG] [EKG]: Secondary | ICD-10-CM | POA: Diagnosis not present

## 2019-05-25 DIAGNOSIS — F209 Schizophrenia, unspecified: Secondary | ICD-10-CM | POA: Insufficient documentation

## 2019-05-25 DIAGNOSIS — E119 Type 2 diabetes mellitus without complications: Secondary | ICD-10-CM | POA: Diagnosis not present

## 2019-05-25 DIAGNOSIS — F22 Delusional disorders: Secondary | ICD-10-CM | POA: Insufficient documentation

## 2019-05-25 DIAGNOSIS — R251 Tremor, unspecified: Secondary | ICD-10-CM | POA: Diagnosis not present

## 2019-05-25 DIAGNOSIS — I10 Essential (primary) hypertension: Secondary | ICD-10-CM | POA: Diagnosis not present

## 2019-05-25 LAB — COMPREHENSIVE METABOLIC PANEL
ALT: 25 U/L (ref 0–44)
AST: 26 U/L (ref 15–41)
Albumin: 4.3 g/dL (ref 3.5–5.0)
Alkaline Phosphatase: 116 U/L (ref 38–126)
Anion gap: 11 (ref 5–15)
BUN: 16 mg/dL (ref 6–20)
CO2: 21 mmol/L — ABNORMAL LOW (ref 22–32)
Calcium: 9.5 mg/dL (ref 8.9–10.3)
Chloride: 107 mmol/L (ref 98–111)
Creatinine, Ser: 0.94 mg/dL (ref 0.44–1.00)
GFR calc Af Amer: >60 mL/min (ref >60)
GFR calc non Af Amer: >60 mL/min (ref >60)
Glucose, Bld: 102 mg/dL — ABNORMAL HIGH (ref 70–99)
Potassium: 4.3 mmol/L (ref 3.5–5.1)
Sodium: 139 mmol/L (ref 135–145)
Total Bilirubin: 0.5 mg/dL (ref 0.3–1.2)
Total Protein: 7.5 g/dL (ref 6.5–8.1)

## 2019-05-25 LAB — CBC
HCT: 44.3 % (ref 36.0–46.0)
Hemoglobin: 13.5 g/dL (ref 12.0–15.0)
MCH: 25.6 pg — ABNORMAL LOW (ref 26.0–34.0)
MCHC: 30.5 g/dL (ref 30.0–36.0)
MCV: 84.1 fL (ref 80.0–100.0)
Platelets: 346 10*3/uL (ref 150–400)
RBC: 5.27 MIL/uL — ABNORMAL HIGH (ref 3.87–5.11)
RDW: 14.2 % (ref 11.5–15.5)
WBC: 6.1 10*3/uL (ref 4.0–10.5)
nRBC: 0 % (ref 0.0–0.2)

## 2019-05-25 MED ORDER — GABAPENTIN 100 MG PO CAPS
100.0000 mg | ORAL_CAPSULE | Freq: Once | ORAL | Status: AC
  Administered 2019-05-25: 100 mg via ORAL
  Filled 2019-05-25: qty 1

## 2019-05-25 NOTE — ED Notes (Signed)
Pt reports having convulsions intermittently, and thinks she may have a seizure w/o hx of the same.

## 2019-05-25 NOTE — ED Triage Notes (Signed)
Pt reports "feeling like she is going to have convulsions soon." pt unable to describe her symptoms, states she felt like she may have a seizure and recently put on gabapentin by [facility] neuro. Pt is tearful and anxious at triage.

## 2019-05-25 NOTE — Discharge Instructions (Addendum)
Please follow-up with [facility] to see if they want to change your gabapentin dose or do any other work-up for your tremors.  Your labs looked normal today, which is good news.

## 2019-05-25 NOTE — ED Notes (Signed)
ED Provider at bedside. 

## 2019-05-25 NOTE — ED Notes (Signed)
Pt started crying while this NT was in room, this NT tried to comfort pt. Pt stated feels like the MD wasn't giving pt proper medical care. This NT reassured that all healthcare workers are working extensively hard to care for pt at this time, and that this NT drew pt's blood and shot an EKG for MD to look at pt's heart. Pt understood and this NT stated lets see what your blood work says and let the MD come in and explain what the MD found. Pt understood. Notified MD.

## 2019-05-25 NOTE — ED Provider Notes (Signed)
[facility] EMERGENCY DEPARTMENT Provider Note   CSN: [id number] Arrival date & time: 05/25/19  1425    History   Chief Complaint Chief Complaint  Patient presents with  . Tremors    HPI Kc Sedlak is a 53 y.o. female with a PMH of delusional disorder, hypertension, major depressive disorder, and schizophrenia who presents with concerns for tremors.  She says that this morning, she began having tremors and thought that "I was going to have a grand mal seizure."  She continues to be worried about the possibility of having a seizure, although she says that she has never had a seizure before.  She says she is eating and drinking like normal, has no focal weakness or numbness, and is ambulating normally.  She is taking gabapentin 100 mg twice daily as prescribed by [facility] neurology but denies taking any other medications, including the medications that she was prescribed upon discharge from behavioral health in April 2020.  When asked about these medications, the patient says that "I am here for a medical problem not an emotional problem."  She would like for [name] to check her electrolytes because "I was filled full of fluid the last time I was in the emergency department."  She would also like for [name] to watch her closely to make sure that she is not having a seizure.     HPI  Past Medical History:  Diagnosis Date  . Anxiety   . Diabetes mellitus without complication ([place])   . Hypertension   . Major depressive disorder, recurrent, severe with psychotic features [facility])     Patient Active Problem List   Diagnosis Date Noted  . Major depressive disorder, recurrent episode, severe, with psychosis ([place])   . Schizophrenia ([place]) 03/26/2018  . Delusional disorder ([place]) 01/07/2018  . Anxiety 03/29/2016  . Homelessness 11/18/2015  . Tobacco use disorder 07/05/2015  . Hypokalemia 05/30/2015  . Obstipation 05/30/2015  . Prolonged QT interval 05/30/2015  . Hypertension  05/30/2015  . Suicidal ideations 03/12/2015  . Diabetes ([place]) 09/06/2012  . Paranoia ([place]) 04/16/2012    History reviewed. No pertinent surgical history.   OB History   No obstetric history on file.      Home Medications    Prior to Admission medications   Medication Sig Start Date End Date Taking? Authorizing Provider  diazepam (VALIUM) 5 MG tablet Take 30 minutes prior to MRI. May take 2nd dose if needed. 04/27/19   Cameron Sprang, MD  gabapentin (NEURONTIN) 100 MG capsule Take 1 capsule (100 mg total) by mouth 2 (two) times daily. 05/20/19   Cameron Sprang, MD    Family History Family History  Problem Relation Age of Onset  . Hypertension Mother   . Diabetes Mother   . Aneurysm Mother   . Hypertension Father   . Diabetes Father   . Hypertension Sister   . Diabetes Sister   . Hypertension Other   . Diabetes Other     Social History Social History   Tobacco Use  . Smoking status: Never Smoker  . Smokeless tobacco: Never Used  Substance Use Topics  . Alcohol use: No  . Drug use: No     Allergies   Patient has no known allergies.   Review of Systems Review of Systems  Constitutional: Negative for activity change, appetite change, fatigue and fever.  HENT: Negative for congestion.   Respiratory: Negative for cough and shortness of breath.   Cardiovascular: Negative for chest pain  and leg swelling.  Gastrointestinal: Negative for abdominal pain.  Genitourinary: Negative for dysuria.  Musculoskeletal: Negative for back pain.  Neurological: Positive for tremors. Negative for seizures and speech difficulty.  Psychiatric/Behavioral: The patient is not nervous/anxious.      Physical Exam Updated Vital Signs BP (!) 134/93   Pulse (!) 57   Temp 98.5 F (36.9 C) (Oral)   Resp 15   LMP 10/29/2011 Comment: NEG U PREG 02/24/17  SpO2 97%   Physical Exam Constitutional:      General: She is not in acute distress.    Appearance: Normal appearance. She is  obese. She is not ill-appearing.  HENT:     Head: Normocephalic and atraumatic.     Right Ear: External ear normal.     Left Ear: External ear normal.     Nose: Nose normal.     Mouth/Throat:     Mouth: Mucous membranes are moist.  Eyes:     Extraocular Movements: Extraocular movements intact.     Conjunctiva/sclera: Conjunctivae normal.     Pupils: Pupils are equal, round, and reactive to light.  Neck:     Musculoskeletal: Normal range of motion and neck supple.  Cardiovascular:     Rate and Rhythm: Normal rate and regular rhythm.     Pulses: Normal pulses.     Heart sounds: Normal heart sounds.  Pulmonary:     Effort: Pulmonary effort is normal.     Breath sounds: Normal breath sounds.  Abdominal:     General: Abdomen is flat. Bowel sounds are normal.     Palpations: Abdomen is soft.  Musculoskeletal: Normal range of motion.        General: No swelling, tenderness or signs of injury.  Skin:    General: Skin is warm and dry.  Neurological:     General: No focal deficit present.     Mental Status: She is alert.     Cranial Nerves: Cranial nerves are intact.     Motor: Motor function is intact. No weakness, tremor or seizure activity.     Coordination: Coordination is intact.  Psychiatric:        Mood and Affect: Affect is labile.        Thought Content: Thought content is delusional.        Judgment: Judgment is not inappropriate.     Comments: Poor insight      ED Treatments / Results  Labs (all labs ordered are listed, but only abnormal results are displayed) Labs Reviewed  CBC - Abnormal; Notable for the following components:      Result Value   RBC 5.27 (*)    MCH 25.6 (*)    All other components within normal limits  COMPREHENSIVE METABOLIC PANEL - Abnormal; Notable for the following components:   CO2 21 (*)    Glucose, Bld 102 (*)    All other components within normal limits    EKG EKG Interpretation  Date/Time:  Wednesday May 25 2019 17:40:29 EDT  Ventricular Rate:  59 PR Interval:    QRS Duration: 87 QT Interval:  410 QTC Calculation: 407 R Axis:   68 Text Interpretation:  Sinus rhythm Low voltage, precordial leads Borderline T abnormalities, diffuse leads No significant change since last tracing Confirmed by Wandra Arthurs [phone]) on 05/25/2019 6:03:59 PM   Radiology No results found.  Procedures Procedures (including critical care time)  Medications Ordered in ED Medications  gabapentin (NEURONTIN) capsule 100 mg (100 mg Oral Given  05/25/19 1852)     Initial Impression / Assessment and Plan / ED Course  I have reviewed the triage vital signs and the nursing notes.  Pertinent labs & imaging results that were available during my care of the patient were reviewed by me and considered in my medical decision making (see chart for details).       Given patient's normal work-up, no tremors visualized, no history of seizures, and significant history of delusional disorder, the concern for neurologic abnormality is very low.  Patient was given gabapentin 100 mg, and she said that she could feel it working immediately.  She was reassured that her lab results were normal and told to follow-up with [facility] neurology.  Unfortunately, her insight to her disorder is very low, and she is suspicious of any discussion about psychiatric illness.  However, she was not a danger to herself or others currently, and she was felt safe for discharge.  Final Clinical Impressions(s) / ED Diagnoses   Final diagnoses:  Delusional disorder [facility])    ED Discharge Orders    None       Kathrene Alu, MD 05/25/19 Curly Rim    Drenda Freeze, MD 05/30/19 [phone]

## 2019-05-26 ENCOUNTER — Telehealth: Payer: Self-pay | Admitting: Neurology

## 2019-05-26 NOTE — Telephone Encounter (Signed)
Dr. Delice Lesch,  Do you need to see this pt in office? Seems like the pt calls frequently. She does have a MRI ordered is has not been scheduled yet.

## 2019-05-26 NOTE — Telephone Encounter (Signed)
Pt states that she went to [facility] yesterday and they want her to have a EEG. She wants to check with DR Delice Lesch about getting one done. She states that the Dr at the ED gave her some gabapentin and said that she needed a EEG and they did not do one there she would like a phone call back. Please call

## 2019-05-27 ENCOUNTER — Other Ambulatory Visit: Payer: Self-pay

## 2019-05-27 DIAGNOSIS — R251 Tremor, unspecified: Secondary | ICD-10-CM

## 2019-05-27 DIAGNOSIS — H81399 Other peripheral vertigo, unspecified ear: Secondary | ICD-10-CM

## 2019-05-27 DIAGNOSIS — R200 Anesthesia of skin: Secondary | ICD-10-CM

## 2019-05-27 DIAGNOSIS — R202 Paresthesia of skin: Secondary | ICD-10-CM

## 2019-05-27 DIAGNOSIS — R42 Dizziness and giddiness: Secondary | ICD-10-CM

## 2019-05-27 MED ORDER — GABAPENTIN 100 MG PO CAPS: 100.0000 mg | ORAL_CAPSULE | Freq: Three times a day (TID) | ORAL | 5 refills | Status: DC

## 2019-05-27 NOTE — Telephone Encounter (Signed)
That is fine, pls send in a new Rx, thanks

## 2019-05-27 NOTE — Telephone Encounter (Signed)
New script sent for Gabapentin 100mg  TID #90

## 2019-05-27 NOTE — Telephone Encounter (Signed)
EEG order placed. Pt will keep MRI as scheduled. She would like to know if she could increase her Gabapentin. Pt would like to take TID.

## 2019-05-27 NOTE — Telephone Encounter (Signed)
I do not see anything in the ER doctor's note about him recommending an EEG, but she was reporting tremors and concern she would have a seizure. Ok to order routine EEG, Dx: tremors. Pls let her know to proceed with MRI brain as well. Continue gabapentin, thanks

## 2019-05-27 NOTE — Telephone Encounter (Signed)
Patient left msg with after hours needing to speak with nurse. Thanks!

## 2019-05-31 ENCOUNTER — Other Ambulatory Visit: Payer: Self-pay

## 2019-05-31 ENCOUNTER — Encounter (HOSPITAL_BASED_OUTPATIENT_CLINIC_OR_DEPARTMENT_OTHER): Payer: Self-pay | Admitting: Emergency Medicine

## 2019-05-31 ENCOUNTER — Emergency Department (HOSPITAL_BASED_OUTPATIENT_CLINIC_OR_DEPARTMENT_OTHER)
Admission: EM | Admit: 2019-05-31 | Discharge: 2019-06-01 | Disposition: A | Discharge status: Other Acute Inpatient Hospital | Payer: Medicare Other | Attending: Emergency Medicine | Admitting: Emergency Medicine

## 2019-05-31 DIAGNOSIS — F209 Schizophrenia, unspecified: Secondary | ICD-10-CM | POA: Insufficient documentation

## 2019-05-31 DIAGNOSIS — F22 Delusional disorders: Secondary | ICD-10-CM | POA: Diagnosis not present

## 2019-05-31 DIAGNOSIS — R42 Dizziness and giddiness: Secondary | ICD-10-CM | POA: Diagnosis not present

## 2019-05-31 DIAGNOSIS — T50904A Poisoning by unspecified drugs, medicaments and biological substances, undetermined, initial encounter: Secondary | ICD-10-CM | POA: Diagnosis present

## 2019-05-31 DIAGNOSIS — Z79899 Other long term (current) drug therapy: Secondary | ICD-10-CM | POA: Insufficient documentation

## 2019-05-31 DIAGNOSIS — T426X1A Poisoning by other antiepileptic and sedative-hypnotic drugs, accidental (unintentional), initial encounter: Secondary | ICD-10-CM | POA: Diagnosis not present

## 2019-05-31 DIAGNOSIS — R400 Somnolence: Secondary | ICD-10-CM | POA: Diagnosis not present

## 2019-05-31 DIAGNOSIS — E119 Type 2 diabetes mellitus without complications: Secondary | ICD-10-CM | POA: Diagnosis not present

## 2019-05-31 DIAGNOSIS — Z20828 Contact with and (suspected) exposure to other viral communicable diseases: Secondary | ICD-10-CM | POA: Insufficient documentation

## 2019-05-31 DIAGNOSIS — I10 Essential (primary) hypertension: Secondary | ICD-10-CM | POA: Insufficient documentation

## 2019-05-31 DIAGNOSIS — R0902 Hypoxemia: Secondary | ICD-10-CM | POA: Diagnosis not present

## 2019-05-31 HISTORY — DX: Schizophrenia, unspecified: F20.9

## 2019-05-31 HISTORY — DX: Homelessness unspecified: Z59.00

## 2019-05-31 HISTORY — DX: Delusional disorders: F22

## 2019-05-31 HISTORY — DX: Tremor, unspecified: R25.1

## 2019-05-31 LAB — CBC
HCT: 40.8 % (ref 36.0–46.0)
Hemoglobin: 12.5 g/dL (ref 12.0–15.0)
MCH: 25.7 pg — ABNORMAL LOW (ref 26.0–34.0)
MCHC: 30.6 g/dL (ref 30.0–36.0)
MCV: 84 fL (ref 80.0–100.0)
Platelets: 314 10*3/uL (ref 150–400)
RBC: 4.86 MIL/uL (ref 3.87–5.11)
RDW: 14.4 % (ref 11.5–15.5)
WBC: 5.5 10*3/uL (ref 4.0–10.5)
nRBC: 0 % (ref 0.0–0.2)

## 2019-05-31 LAB — COMPREHENSIVE METABOLIC PANEL
ALT: 23 U/L (ref 0–44)
AST: 20 U/L (ref 15–41)
Albumin: 4.5 g/dL (ref 3.5–5.0)
Alkaline Phosphatase: 108 U/L (ref 38–126)
Anion gap: 12 (ref 5–15)
BUN: 23 mg/dL — ABNORMAL HIGH (ref 6–20)
CO2: 22 mmol/L (ref 22–32)
Calcium: 9.1 mg/dL (ref 8.9–10.3)
Chloride: 106 mmol/L (ref 98–111)
Creatinine, Ser: 0.9 mg/dL (ref 0.44–1.00)
GFR calc Af Amer: >60 mL/min (ref >60)
GFR calc non Af Amer: >60 mL/min (ref >60)
Glucose, Bld: 107 mg/dL — ABNORMAL HIGH (ref 70–99)
Potassium: 3.5 mmol/L (ref 3.5–5.1)
Sodium: 140 mmol/L (ref 135–145)
Total Bilirubin: 0.5 mg/dL (ref 0.3–1.2)
Total Protein: 7.8 g/dL (ref 6.5–8.1)

## 2019-05-31 LAB — RAPID URINE DRUG SCREEN, HOSP PERFORMED
Amphetamines: NOT DETECTED
Barbiturates: NOT DETECTED
Benzodiazepines: NOT DETECTED
Cocaine: NOT DETECTED
Opiates: NOT DETECTED
Tetrahydrocannabinol: NOT DETECTED

## 2019-05-31 LAB — ETHANOL: Alcohol, Ethyl (B): <10 mg/dL (ref <10)

## 2019-05-31 LAB — ACETAMINOPHEN LEVEL: Acetaminophen (Tylenol), Serum: <10 ug/mL — ABNORMAL LOW (ref 10–30)

## 2019-05-31 LAB — SALICYLATE LEVEL: Salicylate Lvl: <7 mg/dL (ref 2.8–30.0)

## 2019-05-31 NOTE — ED Notes (Signed)
Contacted [facility]. Advised pt to be monitored for CNS depression x 6 hours, EKG, acetaminophen levels, and supportive care. MD made aware.

## 2019-05-31 NOTE — ED Notes (Signed)
ED Provider at bedside. 

## 2019-05-31 NOTE — ED Notes (Signed)
Pt requesting blanket. Drowsy, but responds quickly to verbal. Pt states living in car as of today, contacted EMS because she felt drowsy after taking 16 gabapentin through out the day so she did not have a seizure. Pt not DX with seizure hx. Pt states she does have family that she can contact to assist her with living condition.

## 2019-05-31 NOTE — ED Triage Notes (Signed)
EMS transport, pt lives in car and took 16 gabapentin today. Pt states was not trying to harm self, trying to stop seizures. Pt drowsy on arrival. Maintaining own airway. Responsive.

## 2019-05-31 NOTE — ED Notes (Signed)
Pt states cant urinate now.

## 2019-06-01 ENCOUNTER — Other Ambulatory Visit: Payer: Self-pay

## 2019-06-01 ENCOUNTER — Other Ambulatory Visit: Payer: Self-pay | Admitting: Behavioral Health

## 2019-06-01 ENCOUNTER — Encounter (HOSPITAL_COMMUNITY): Payer: Self-pay | Admitting: Behavioral Health

## 2019-06-01 ENCOUNTER — Encounter (HOSPITAL_BASED_OUTPATIENT_CLINIC_OR_DEPARTMENT_OTHER): Payer: Self-pay | Admitting: Emergency Medicine

## 2019-06-01 ENCOUNTER — Inpatient Hospital Stay (HOSPITAL_COMMUNITY)
Admission: AD | Admit: 2019-06-01 | Discharge: 2019-06-07 | DRG: 885 | Disposition: A | Discharge status: Home / Self Care | Payer: Medicare Other | Source: Intra-hospital | Attending: Psychiatry | Admitting: Psychiatry

## 2019-06-01 DIAGNOSIS — F600 Paranoid personality disorder: Secondary | ICD-10-CM | POA: Diagnosis present

## 2019-06-01 DIAGNOSIS — F209 Schizophrenia, unspecified: Secondary | ICD-10-CM | POA: Diagnosis present

## 2019-06-01 DIAGNOSIS — F22 Delusional disorders: Secondary | ICD-10-CM | POA: Diagnosis not present

## 2019-06-01 DIAGNOSIS — T50904A Poisoning by unspecified drugs, medicaments and biological substances, undetermined, initial encounter: Secondary | ICD-10-CM | POA: Diagnosis not present

## 2019-06-01 DIAGNOSIS — Z20828 Contact with and (suspected) exposure to other viral communicable diseases: Secondary | ICD-10-CM | POA: Diagnosis present

## 2019-06-01 DIAGNOSIS — Z9114 Patient's other noncompliance with medication regimen: Secondary | ICD-10-CM | POA: Diagnosis not present

## 2019-06-01 DIAGNOSIS — F200 Paranoid schizophrenia: Secondary | ICD-10-CM | POA: Diagnosis not present

## 2019-06-01 DIAGNOSIS — F29 Unspecified psychosis not due to a substance or known physiological condition: Secondary | ICD-10-CM | POA: Diagnosis not present

## 2019-06-01 LAB — SARS CORONAVIRUS 2 BY RT PCR (HOSPITAL ORDER, PERFORMED IN ~~LOC~~ HOSPITAL LAB): SARS Coronavirus 2: NEGATIVE

## 2019-06-01 LAB — PREGNANCY, URINE: Preg Test, Ur: NEGATIVE

## 2019-06-01 LAB — GLUCOSE, CAPILLARY: Glucose-Capillary: 102 mg/dL — ABNORMAL HIGH (ref 70–99)

## 2019-06-01 MED ORDER — ACETAMINOPHEN 325 MG PO TABS
650.0000 mg | ORAL_TABLET | ORAL | Status: DC | PRN
  Administered 2019-06-01 (×2): 650 mg via ORAL
  Filled 2019-06-01 (×2): qty 2

## 2019-06-01 MED ORDER — BENZTROPINE MESYLATE 0.5 MG PO TABS
0.5000 mg | ORAL_TABLET | Freq: Two times a day (BID) | ORAL | Status: DC
  Administered 2019-06-01 – 2019-06-05 (×5): 0.5 mg via ORAL
  Filled 2019-06-01 (×17): qty 1

## 2019-06-01 MED ORDER — ZIPRASIDONE MESYLATE 20 MG IM SOLR: 10.0000 mg | INTRAMUSCULAR | Status: DC | PRN

## 2019-06-01 MED ORDER — LORAZEPAM 1 MG PO TABS: 1.0000 mg | ORAL_TABLET | ORAL | Status: DC | PRN

## 2019-06-01 MED ORDER — FLUPHENAZINE HCL 2.5 MG PO TABS
2.5000 mg | ORAL_TABLET | Freq: Two times a day (BID) | ORAL | Status: DC
  Administered 2019-06-01: 18:00:00 2.5 mg via ORAL
  Filled 2019-06-01 (×5): qty 1

## 2019-06-01 MED ORDER — LORAZEPAM 1 MG PO TABS
1.0000 mg | ORAL_TABLET | Freq: Three times a day (TID) | ORAL | Status: DC | PRN
  Administered 2019-06-02 – 2019-06-05 (×3): 1 mg via ORAL
  Filled 2019-06-01 (×2): qty 1

## 2019-06-01 MED ORDER — ALUM & MAG HYDROXIDE-SIMETH 200-200-20 MG/5ML PO SUSP: 30.0000 mL | Freq: Four times a day (QID) | ORAL | Status: DC | PRN

## 2019-06-01 MED ORDER — ACETAMINOPHEN 325 MG PO TABS
650.0000 mg | ORAL_TABLET | Freq: Four times a day (QID) | ORAL | Status: DC | PRN
  Administered 2019-06-01 – 2019-06-06 (×7): 650 mg via ORAL
  Filled 2019-06-01 (×9): qty 2

## 2019-06-01 MED ORDER — ZIPRASIDONE MESYLATE 20 MG IM SOLR: 20.0000 mg | INTRAMUSCULAR | Status: DC | PRN

## 2019-06-01 MED ORDER — OLANZAPINE 5 MG PO TBDP: 5.0000 mg | ORAL_TABLET | Freq: Three times a day (TID) | ORAL | Status: DC | PRN

## 2019-06-01 MED ORDER — HYDROXYZINE HCL 25 MG PO TABS
ORAL_TABLET | ORAL | Status: AC
  Administered 2019-06-01: 07:00:00 25 mg via ORAL
  Filled 2019-06-01: qty 1

## 2019-06-01 MED ORDER — HYDROXYZINE HCL 25 MG PO TABS
25.0000 mg | ORAL_TABLET | Freq: Once | ORAL | Status: AC
  Administered 2019-06-01: 25 mg via ORAL

## 2019-06-01 MED ORDER — ALUM & MAG HYDROXIDE-SIMETH 200-200-20 MG/5ML PO SUSP: 30.0000 mL | ORAL | Status: DC | PRN

## 2019-06-01 NOTE — ED Notes (Signed)
Report given to Elberta Fortis, Therapist, sports at [facility].  I informed him that pt lost her apartment and if he can get Social Worker involved.

## 2019-06-01 NOTE — ED Notes (Signed)
Pt wants [facility] neurology notified as she takes Gabapentin. Notified RN Elba Barman of request.

## 2019-06-01 NOTE — ED Notes (Signed)
Pt awake and alert. She states she is living in her car and did not want seizures to happen, that is why she took so many gabapentin. Explained to patient TTS consult in progress.

## 2019-06-01 NOTE — ED Notes (Signed)
I went to patient's room while she was awake and told her that she will be transferred to [facility].  She asked her to sign the consent and she stated to just put an x.  I asked Charge Nurse If I can do it.  Ms. Seth Bake, RNwas there during this conversation and she did me a favor and asked the patient to sign the consent and according to her, patient wants to go home.  EDP informed of patient's desire.  EDP went to patient's room and talked to her.   Per EDP, patient agreed to go to [facility].  Attempted to get a consent from patient and she stated that she has to think.  I left the room and she called me again.  I went to the room with Ms. Seth Bake.  Patient was crying and after consoling her, she stated to bring the paper and she will sign it.
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
## 2019-06-01 NOTE — Progress Notes (Signed)
Pt accepted to [facility], Bed 507-1 WHEN ROOM IS AVAILABLE Anette Riedel, NP is the accepting provider.  Johnn Hai, MD, is the attending provider.  Call report to [phone]   @ [facility] ED notified.   Pt is Voluntary.  Pt may be transported by [facility]  Pt scheduled  to arrive at [facility].  [facility] CSW WILL CALL.  Areatha Keas. Judi Cong, MSW, [place] Disposition Clinical Social Work [phone] (cell) [phone] (office)

## 2019-06-01 NOTE — ED Notes (Signed)
Attempted to call report.  Accepting nurse is currently discharging another patient per Staff (female).

## 2019-06-01 NOTE — ED Notes (Signed)
Dr. Randal Buba emailed neurologist about pt taking large amount of gabapentin yesterday and pt is not taking medicine as prescribed.
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
## 2019-06-01 NOTE — ED Notes (Signed)
Pt completed TTS session.

## 2019-06-01 NOTE — ED Notes (Signed)
PT with eyes closed, will respond appropriate when awaken.

## 2019-06-01 NOTE — ED Notes (Signed)
Pt talking with TTS  
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
## 2019-06-01 NOTE — ED Notes (Signed)
Pt now awake and alert, c/o "a little headache", requests tylenol. Medicated per prn order, lights dimmed, assisted to position of comfort, warm blankets provided.

## 2019-06-01 NOTE — Progress Notes (Signed)
Pt. meets criteria for inpatient treatment per Jas, NP.  No appropriate beds available at [facility]. Referred out to the following hospitals:  [facility] Details [facility] Details [facility] Details [facility] Details [facility] Details [facility] Details [facility] Details [facility] Details [facility] Details [facility] Details [facility]  Disposition CSW will continue to follow for placement.  Areatha Keas. Judi Cong, MSW, [place] Disposition Clinical Social Work [phone] (cell) [phone] (office)
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
## 2019-06-01 NOTE — ED Provider Notes (Addendum)
[facility] EMERGENCY DEPARTMENT Provider Note   CSN: [id number] Arrival date & time: 05/31/19  2134     History   Chief Complaint Chief Complaint  Patient presents with  . Drug Overdose    HPI [name] is a 53 y.o. female.     The history is provided by the patient and medical records.  Drug Overdose This is a new problem. The current episode started 12 to 24 hours ago. The problem occurs constantly. The problem has not changed since onset.Pertinent negatives include no chest pain, no abdominal pain, no headaches and no shortness of breath. Nothing aggravates the symptoms. Nothing relieves the symptoms. She has tried nothing for the symptoms. The treatment provided mild relief.  Patient with schizophrenia and delusional personality DO states she took 66 neurontin over the course of 1 day to prevent seizures.  Patient does not have a documented seizure history but denies SI and HI  Past Medical History:  Diagnosis Date  . Anxiety   . Delusional disorder ([place])   . Diabetes mellitus without complication ([place])   . Homelessness   . Hypertension   . Major depressive disorder, recurrent, severe with psychotic features ([place])   . Schizophrenia ([place])   . Tremors of nervous system     Patient Active Problem List   Diagnosis Date Noted  . Major depressive disorder, recurrent episode, severe, with psychosis ([place])   . Schizophrenia ([place]) 03/26/2018  . Delusional disorder ([place]) 01/07/2018  . Anxiety 03/29/2016  . Homelessness 11/18/2015  . Tobacco use disorder 07/05/2015  . Hypokalemia 05/30/2015  . Obstipation 05/30/2015  . Prolonged QT interval 05/30/2015  . Hypertension 05/30/2015  . Suicidal ideations 03/12/2015  . Diabetes ([place]) 09/06/2012  . Paranoia ([place]) 04/16/2012    History reviewed. No pertinent surgical history.   OB History   No obstetric history on file.      Home Medications    Prior to Admission medications   Medication Sig Start Date  End Date Taking? Authorizing Provider  diazepam (VALIUM) 5 MG tablet Take 30 minutes prior to MRI. May take 2nd dose if needed. 04/27/19   Cameron Sprang, MD  gabapentin (NEURONTIN) 100 MG capsule Take 1 capsule (100 mg total) by mouth 3 (three) times daily. 05/27/19   Cameron Sprang, MD    Family History Family History  Problem Relation Age of Onset  . Hypertension Mother   . Diabetes Mother   . Aneurysm Mother   . Hypertension Father   . Diabetes Father   . Hypertension Sister   . Diabetes Sister   . Hypertension Other   . Diabetes Other     Social History Social History   Tobacco Use  . Smoking status: Never Smoker  . Smokeless tobacco: Never Used  Substance Use Topics  . Alcohol use: No  . Drug use: No     Allergies   Patient has no known allergies.   Review of Systems Review of Systems  Constitutional: Negative for fever.  HENT: Negative for trouble swallowing.   Eyes: Negative for visual disturbance.  Respiratory: Negative for shortness of breath.   Cardiovascular: Negative for chest pain.  Gastrointestinal: Negative for abdominal pain.  Genitourinary: Negative for dysuria.  Musculoskeletal: Negative for back pain.  Neurological: Negative for headaches.  Psychiatric/Behavioral: Negative for agitation, confusion, hallucinations and suicidal ideas.  All other systems reviewed and are negative.    Physical Exam Updated Vital Signs BP 117/74   Pulse 65   Temp  98.3 F (36.8 C) (Oral)   Resp 14   Ht 5' 5.5" (1.664 m)   Wt 119.3 kg   LMP 10/29/2011 Comment: NEG U PREG 02/24/17  SpO2 95%   BMI 43.10 kg/m   Physical Exam Vitals signs and nursing note reviewed.  Constitutional:      General: She is not in acute distress.    Appearance: She is obese.  HENT:     Head: Normocephalic and atraumatic.     Nose: Nose normal.     Mouth/Throat:     Mouth: Mucous membranes are moist.     Pharynx: Oropharynx is clear.  Eyes:     Conjunctiva/sclera:  Conjunctivae normal.     Pupils: Pupils are equal, round, and reactive to light.  Neck:     Musculoskeletal: Normal range of motion and neck supple.  Cardiovascular:     Rate and Rhythm: Normal rate and regular rhythm.     Pulses: Normal pulses.     Heart sounds: Normal heart sounds.  Pulmonary:     Effort: Pulmonary effort is normal.     Breath sounds: Normal breath sounds.  Abdominal:     General: Abdomen is flat. Bowel sounds are normal.     Tenderness: There is no abdominal tenderness. There is no guarding.  Musculoskeletal: Normal range of motion.  Skin:    General: Skin is warm and dry.     Capillary Refill: Capillary refill takes less than 2 seconds.  Neurological:     General: No focal deficit present.     Mental Status: She is alert and oriented to person, place, and time.  Psychiatric:        Mood and Affect: Mood normal.        Behavior: Behavior normal.      ED Treatments / Results  Labs (all labs ordered are listed, but only abnormal results are displayed) Results for orders placed or performed during the hospital encounter of 05/31/19  Comprehensive metabolic panel  Result Value Ref Range   Sodium 140 135 - 145 mmol/L   Potassium 3.5 3.5 - 5.1 mmol/L   Chloride 106 98 - 111 mmol/L   CO2 22 22 - 32 mmol/L   Glucose, Bld 107 (H) 70 - 99 mg/dL   BUN 23 (H) 6 - 20 mg/dL   Creatinine, Ser 0.90 0.44 - 1.00 mg/dL   Calcium 9.1 8.9 - 10.3 mg/dL   Total Protein 7.8 6.5 - 8.1 g/dL   Albumin 4.5 3.5 - 5.0 g/dL   AST 20 15 - 41 U/L   ALT 23 0 - 44 U/L   Alkaline Phosphatase 108 38 - 126 U/L   Total Bilirubin 0.5 0.3 - 1.2 mg/dL   GFR calc non Af Amer >60 >60 mL/min   GFR calc Af Amer >60 >60 mL/min   Anion gap 12 5 - 15  Ethanol  Result Value Ref Range   Alcohol, Ethyl (B) <79 <39 mg/dL  Salicylate level  Result Value Ref Range   Salicylate Lvl <0.3 2.8 - 30.0 mg/dL  Acetaminophen level  Result Value Ref Range   Acetaminophen (Tylenol), Serum <10 (L) 10 -  30 ug/mL  cbc  Result Value Ref Range   WBC 5.5 4.0 - 10.5 K/uL   RBC 4.86 3.87 - 5.11 MIL/uL   Hemoglobin 12.5 12.0 - 15.0 g/dL   HCT 40.8 36.0 - 46.0 %   MCV 84.0 80.0 - 100.0 fL   MCH 25.7 (L) 26.0 - 34.0  pg   MCHC 30.6 30.0 - 36.0 g/dL   RDW 14.4 11.5 - 15.5 %   Platelets 314 150 - 400 K/uL   nRBC 0.0 0.0 - 0.2 %  Rapid urine drug screen (hospital performed)  Result Value Ref Range   Opiates NONE DETECTED NONE DETECTED   Cocaine NONE DETECTED NONE DETECTED   Benzodiazepines NONE DETECTED NONE DETECTED   Amphetamines NONE DETECTED NONE DETECTED   Tetrahydrocannabinol NONE DETECTED NONE DETECTED   Barbiturates NONE DETECTED NONE DETECTED   No results found.  EKG EKG Interpretation  Date/Time:  Tuesday May 31 2019 21:43:28 EDT Ventricular Rate:  66 PR Interval:    QRS Duration: 93 QT Interval:  427 QTC Calculation: 448 R Axis:   13 Text Interpretation:  Sinus rhythm Borderline T abnormalities, anterior leads Confirmed by Quintella Reichert [phone]) on 05/31/2019 9:48:00 PM   Radiology No results found.  Procedures Procedures (including critical care time)  Medically cleared by me and poison control for TTS.     Final Clinical Impressions(s) / ED Diagnoses    TTS recommends inpatient care.  Patient is willing to sign in voluntarily.  Should this change patient will need IVC.      Brian Kocourek, MD 06/01/19 [phone]

## 2019-06-01 NOTE — ED Notes (Signed)
PT states she is having a convulsion. VSS. Pt alert, requesting her gabapentin. Informed MD. Pt cooperative, but upset that she can not have gabapentin. Explained to patient she can not have anymore of the medication and is awaiting a [place] bed.

## 2019-06-01 NOTE — ED Notes (Signed)
Pt resting, alert, no /co at present. Awaiting bed placement. Informed of plan.

## 2019-06-02 MED ORDER — ZIPRASIDONE MESYLATE 20 MG IM SOLR: 10.0000 mg | INTRAMUSCULAR | Status: DC | PRN

## 2019-06-02 MED ORDER — LORAZEPAM 1 MG PO TABS
1.0000 mg | ORAL_TABLET | ORAL | Status: AC | PRN
  Administered 2019-06-03: 1 mg via ORAL
  Filled 2019-06-02 (×2): qty 1

## 2019-06-02 MED ORDER — OLANZAPINE 5 MG PO TBDP
15.0000 mg | ORAL_TABLET | Freq: Three times a day (TID) | ORAL | Status: DC | PRN
  Administered 2019-06-02 – 2019-06-03 (×2): 15 mg via ORAL
  Filled 2019-06-02 (×2): qty 1

## 2019-06-02 MED ORDER — FLUPHENAZINE HCL 5 MG PO TABS
5.0000 mg | ORAL_TABLET | Freq: Three times a day (TID) | ORAL | Status: DC
  Administered 2019-06-03 – 2019-06-05 (×6): 5 mg via ORAL
  Filled 2019-06-02 (×20): qty 1

## 2019-06-02 MED ORDER — TEMAZEPAM 30 MG PO CAPS
30.0000 mg | ORAL_CAPSULE | Freq: Every day | ORAL | Status: DC
  Administered 2019-06-03 – 2019-06-04 (×2): 30 mg via ORAL
  Filled 2019-06-02 (×3): qty 1

## 2019-06-02 MED ORDER — CLONAZEPAM 1 MG PO TABS
2.0000 mg | ORAL_TABLET | Freq: Once | ORAL | Status: AC
  Administered 2019-06-02: 15:00:00 2 mg via ORAL
  Filled 2019-06-02: qty 2

## 2019-06-02 NOTE — Progress Notes (Signed)
Pt a sleep at the begging of the shift, pt has been sleeping throughout the night, no interaction with the writer, respirations are even and unlabored, will continue to monitor.

## 2019-06-02 NOTE — [facility] Counselor (Signed)
CSW attempted to meet with patient for PSA assessment. CSW attempted to wake up the patient twice. Patient rolled over and stated, " can we do it later? I don't feel like doing it right now".

## 2019-06-02 NOTE — Progress Notes (Signed)
Recreation Therapy Notes  Date: 06/02/2019 Time: 10:00 am Location: 500 hall   Group Topic: Triggers  Goal Area(s) Addresses:  Patient will work on worksheet on Triggers. Patient will follow directions on first prompt.  Behavioral Response: Appropriate  Intervention: Worksheet  Activity:  Staff on 500 hall were provided with a worksheet on Triggers. Staff was instructed to give it to the patients and have them work on it in place of [place]. Staff was also given 4 coloring sheets and were given the option to give them out.  Education:  Ability to follow Directions, Change of thought processes Discharge Planning, Goal Planning.   Education Outcome: Acknowledges education/In group clarification offered  Clinical Observations/Feedback: . Due to COVID-19, guidelines group was not held. Group members were provided a learning activity worksheet to work on the topic and above-stated goals. LRT is available to answer any questions patient may have regarding the worksheet.  Tomi Likens, LRT/CTRS         Caeden Foots L [place] 06/02/2019 10:45 AM

## 2019-06-02 NOTE — BHH Group Notes (Signed)
[place] LCSW Group Therapy Note  Date/Time: 06/02/2019 @ 1:00pm  Type of Therapy/Topic:  Group Therapy:  Feelings about Diagnosis  Participation Level:  Did Not Attend   Mood: Did not attend    Description of Group:    This group will allow patients to explore their thoughts and feelings about diagnoses they have received. Patients will be guided to explore their level of understanding and acceptance of these diagnoses. Facilitator will encourage patients to process their thoughts and feelings about the reactions of others to their diagnosis, and will guide patients in identifying ways to discuss their diagnosis with significant others in their lives. This group will be process-oriented, with patients participating in exploration of their own experiences as well as giving and receiving support and challenge from other group members.   Therapeutic Goals: 1. Patient will demonstrate understanding of diagnosis as evidence by identifying two or more symptoms of the disorder:  2. Patient will be able to express two feelings regarding the diagnosis 3. Patient will demonstrate ability to communicate their needs through discussion and/or role plays  Summary of Patient Progress:    Patient did not attend group.     Therapeutic Modalities:   Cognitive Behavioral Therapy Brief Therapy Feelings Identification   Ardelle Anton, LCSW

## 2019-06-02 NOTE — Progress Notes (Signed)
[facility] MD Progress Note  06/02/2019 10:38 AM [name]  MRN:  [id number] Subjective:   Patient has displayed an unstable mood/yelling behaviors this morning, then was seen in her bed trying to sleep.  She is again labile.  She remains delusional but will not elaborate simply telling me "I do not remember why I am here" There are no involuntary movements but intermittent psychomotor agitation. She will not elaborate on previously expressed delusional material. She denies thoughts of harming self or others and hallucinations but is oriented only to person place situation and month and year Principal Problem: Exacerbation of chronic psychotic disorder probably due to noncompliance Diagnosis: Active Problems:   Schizophrenia ([place])  Total Time spent with patient: 20 minutes  Past Psychiatric History: neg UDS on admit  Past Medical History:  Past Medical History:  Diagnosis Date  . Anxiety   . Delusional disorder ([place])   . Diabetes mellitus without complication ([place])   . Homelessness   . Hypertension   . Major depressive disorder, recurrent, severe with psychotic features ([place])   . Schizophrenia ([place])   . Tremors of nervous system    History reviewed. No pertinent surgical history. Family History:  Family History  Problem Relation Age of Onset  . Hypertension Mother   . Diabetes Mother   . Aneurysm Mother   . Hypertension Father   . Diabetes Father   . Hypertension Sister   . Diabetes Sister   . Hypertension Other   . Diabetes Other    Family Psychiatric  History: no new data Social History:  Social History   Substance and Sexual Activity  Alcohol Use No     Social History   Substance and Sexual Activity  Drug Use No    Social History   Socioeconomic History  . Marital status: Single    Spouse name: Not on file  . Number of children: Not on file  . Years of education: Not on file  . Highest education level: Not on file  Occupational History  . Not on file   Social Needs  . Financial resource strain: Not on file  . Food insecurity    Worry: Not on file    Inability: Not on file  . Transportation needs    Medical: Not on file    Non-medical: Not on file  Tobacco Use  . Smoking status: Never Smoker  . Smokeless tobacco: Never Used  Substance and Sexual Activity  . Alcohol use: No  . Drug use: No  . Sexual activity: Never  Lifestyle  . Physical activity    Days per week: Not on file    Minutes per session: Not on file  . Stress: Not on file  Relationships  . Social Herbalist on phone: Not on file    Gets together: Not on file    Attends religious service: Not on file    Active member of club or organization: Not on file    Attends meetings of clubs or organizations: Not on file    Relationship status: Not on file  Other Topics Concern  . Not on file  Social History Narrative   Lives alone in an apartment. Has steps up to apartment      Right handed      Highest level of edu- Some college      Unemployed   Additional Social History:  Sleep: Fair  Appetite:  Good  Current Medications: Current Facility-Administered Medications  Medication Dose Route Frequency Provider Last Rate Last Dose  . acetaminophen (TYLENOL) tablet 650 mg  650 mg Oral Q6H PRN Mordecai Maes, NP   650 mg at 06/02/19 0955  . alum & mag hydroxide-simeth (MAALOX/MYLANTA) 200-200-20 MG/5ML suspension 30 mL  30 mL Oral Q4H PRN Mordecai Maes, NP      . benztropine (COGENTIN) tablet 0.5 mg  0.5 mg Oral BID Cobos, Myer Peer, MD   0.5 mg at 06/01/19 1759  . fluPHENAZine (PROLIXIN) tablet 2.5 mg  2.5 mg Oral BID Cobos, Myer Peer, MD   2.5 mg at 06/01/19 1755  . LORazepam (ATIVAN) tablet 1 mg  1 mg Oral Q8H PRN Cobos, Fernando A, MD      . ziprasidone (GEODON) injection 10 mg  10 mg Intramuscular PRN Cobos, Myer Peer, MD       And  . OLANZapine zydis (ZYPREXA) disintegrating tablet 5 mg  5 mg Oral Q8H PRN  Cobos, Myer Peer, MD       And  . LORazepam (ATIVAN) tablet 1 mg  1 mg Oral PRN Cobos, Myer Peer, MD        Lab Results:  Results for orders placed or performed during the hospital encounter of 06/01/19 (from the past 48 hour(s))  Glucose, capillary     Status: Abnormal   Collection Time: 06/01/19  5:19 PM  Result Value Ref Range   Glucose-Capillary 102 (H) 70 - 99 mg/dL    Blood Alcohol level:  Lab Results  Component Value Date   ETH <10 05/31/2019   ETH <10 81/15/7262    Metabolic Disorder Labs: Lab Results  Component Value Date   HGBA1C 6.8 (H) 02/23/2019   MPG 148.46 02/23/2019   MPG 143 (H) 08/30/2012   No results found for: PROLACTIN Lab Results  Component Value Date   CHOL 203 (H) 02/23/2019   TRIG 132 02/23/2019   HDL 44 02/23/2019   CHOLHDL 4.6 02/23/2019   VLDL 26 02/23/2019   LDLCALC 133 (H) 02/23/2019   LDLCALC 150 (H) 07/05/2015    Physical Findings: AIMS: Facial and Oral Movements Muscles of Facial Expression: None, normal Lips and Perioral Area: None, normal Jaw: None, normal Tongue: None, normal,Extremity Movements Upper (arms, wrists, hands, fingers): None, normal Lower (legs, knees, ankles, toes): None, normal, Trunk Movements Neck, shoulders, hips: None, normal, Overall Severity Severity of abnormal movements (highest score from questions above): None, normal Incapacitation due to abnormal movements: None, normal Patient's awareness of abnormal movements (rate only patient's report): No Awareness, Dental Status Current problems with teeth and/or dentures?: No Does patient usually wear dentures?: No  CIWA:    COWS:     Musculoskeletal: Strength & Muscle Tone: within normal limits Gait & Station: normal Patient leans: N/A  Psychiatric Specialty Exam: Physical Exam  ROS  Blood pressure 130/90, pulse 81, temperature 98 F (36.7 C), resp. rate 16, last menstrual period 10/29/2011, SpO2 97 %.There is no height or weight on file to  calculate BMI.  General Appearance: Casual  Eye Contact:  Minimal  Speech:  Slow  Volume:  Decreased  Mood:  labile  Affect:  Labile  Thought Process:  Irrelevant and Descriptions of Associations: Loose  Orientation: General person place and situation  Thought Content:  Delusions and Tangential  Suicidal Thoughts:  No  Homicidal Thoughts:  No  Memory:  Recent;   Fair  Judgement:  Fair  Insight:  Fair  Psychomotor Activity:  Increased  Concentration:  Concentration: Fair  Recall:  [employer] of Knowledge:  Fair  Language:  Fair  Akathisia:  Negative  Handed:  Right  AIMS (if indicated):     Assets:  Social Support  ADL's:  Intact  Cognition:  WNL  Sleep:  Number of Hours: 5.75     Treatment Plan Summary: Daily contact with patient to assess and evaluate symptoms and progress in treatment and Medication management  Continue current precautions for safety, escalate standing dose of Prolixin, continue benztropine, escalate PRN dose of Zyprexa, continue to monitor for safety and reality based therapy discussed basic risk benefits side effects but are generally lost due to the patient confusion  Delancey Moraes, MD 06/02/2019, 10:38 AM

## 2019-06-03 NOTE — Progress Notes (Signed)
Recreation Therapy Notes  Date: 06/03/2019 Time: 10:00 am Location: 500 hall   Group Topic: Anger Thermometer  Goal Area(s) Addresses:  Patient will work on Academic librarian. Patient will follow directions on first prompt.  Behavioral Response: Appropriate  Intervention: Worksheet  Activity:  Staff on 500 hall were provided with a worksheet on Anger Thermometer. Staff was instructed to give it to the patients and have them work on it in place of [place]. Staff was also given 4 coloring sheets and were given the option to give them out.  Education:  Ability to follow Directions, Change of thought processes Discharge Planning, Goal Planning.   Education Outcome: Acknowledges education/In group clarification offered  Clinical Observations/Feedback: . Due to COVID-19, guidelines group was not held. Group members were provided a learning activity worksheet to work on the topic and above-stated goals. LRT is available to answer any questions patient may have regarding the worksheet.  Tomi Likens, LRT/CTRS         Elfa Wooton L Maryah Marinaro 06/03/2019 10:11 AM

## 2019-06-03 NOTE — Progress Notes (Signed)
[facility] MD Progress Note  06/03/2019 12:41 PM [name]  MRN:  [id number] Subjective:   Patient in bed has slept very late today she is however oriented to person place situation not exact date states she has been "living in her car" and that is why she is sleeping so much now she denies current auditory or visual hallucinations her mood is certainly more contained than her initial presentation.  Denies thoughts of harming self or others, no EPS or TD Principal Problem: Exacerbation of underlying psychotic disorder Diagnosis: Active Problems:   Schizophrenia ([place])  Total Time spent with patient: 20 minutes  Past Medical History:  Past Medical History:  Diagnosis Date  . Anxiety   . Delusional disorder ([place])   . Diabetes mellitus without complication ([place])   . Homelessness   . Hypertension   . Major depressive disorder, recurrent, severe with psychotic features ([place])   . Schizophrenia ([place])   . Tremors of nervous system    History reviewed. No pertinent surgical history. Family History:  Family History  Problem Relation Age of Onset  . Hypertension Mother   . Diabetes Mother   . Aneurysm Mother   . Hypertension Father   . Diabetes Father   . Hypertension Sister   . Diabetes Sister   . Hypertension Other   . Diabetes Other   Social History:  Social History   Substance and Sexual Activity  Alcohol Use No     Social History   Substance and Sexual Activity  Drug Use No    Social History   Socioeconomic History  . Marital status: Single    Spouse name: Not on file  . Number of children: Not on file  . Years of education: Not on file  . Highest education level: Not on file  Occupational History  . Not on file  Social Needs  . Financial resource strain: Not on file  . Food insecurity    Worry: Not on file    Inability: Not on file  . Transportation needs    Medical: Not on file    Non-medical: Not on file  Tobacco Use  . Smoking status: Never Smoker  .  Smokeless tobacco: Never Used  Substance and Sexual Activity  . Alcohol use: No  . Drug use: No  . Sexual activity: Never  Lifestyle  . Physical activity    Days per week: Not on file    Minutes per session: Not on file  . Stress: Not on file  Relationships  . Social Herbalist on phone: Not on file    Gets together: Not on file    Attends religious service: Not on file    Active member of club or organization: Not on file    Attends meetings of clubs or organizations: Not on file    Relationship status: Not on file  Other Topics Concern  . Not on file  Social History Narrative   Lives alone in an apartment. Has steps up to apartment      Right handed      Highest level of edu- Some college      Unemployed   Additional Social History:                         Sleep: Good  Appetite:  Good  Current Medications: Current Facility-Administered Medications  Medication Dose Route Frequency Provider Last Rate Last Dose  . acetaminophen (TYLENOL) tablet 650 mg  650 mg Oral Q6H PRN Mordecai Maes, NP   650 mg at 06/03/19 1217  . alum & mag hydroxide-simeth (MAALOX/MYLANTA) 200-200-20 MG/5ML suspension 30 mL  30 mL Oral Q4H PRN Mordecai Maes, NP      . benztropine (COGENTIN) tablet 0.5 mg  0.5 mg Oral BID Cobos, Myer Peer, MD   0.5 mg at 06/01/19 1759  . fluPHENAZine (PROLIXIN) tablet 5 mg  5 mg Oral TID Johnn Hai, MD   5 mg at 06/03/19 1219  . LORazepam (ATIVAN) tablet 1 mg  1 mg Oral Q8H PRN Cobos, Myer Peer, MD   1 mg at 06/02/19 1528  . OLANZapine zydis (ZYPREXA) disintegrating tablet 15 mg  15 mg Oral Q8H PRN Johnn Hai, MD   15 mg at 06/02/19 1528   And  . LORazepam (ATIVAN) tablet 1 mg  1 mg Oral PRN Johnn Hai, MD       And  . ziprasidone (GEODON) injection 10 mg  10 mg Intramuscular PRN Johnn Hai, MD      . temazepam (RESTORIL) capsule 30 mg  30 mg Oral QHS Johnn Hai, MD        Lab Results:  Results for orders placed or  performed during the hospital encounter of 06/01/19 (from the past 48 hour(s))  Glucose, capillary     Status: Abnormal   Collection Time: 06/01/19  5:19 PM  Result Value Ref Range   Glucose-Capillary 102 (H) 70 - 99 mg/dL    Blood Alcohol level:  Lab Results  Component Value Date   ETH <10 05/31/2019   ETH <10 32/99/2426    Metabolic Disorder Labs: Lab Results  Component Value Date   HGBA1C 6.8 (H) 02/23/2019   MPG 148.46 02/23/2019   MPG 143 (H) 08/30/2012   No results found for: PROLACTIN Lab Results  Component Value Date   CHOL 203 (H) 02/23/2019   TRIG 132 02/23/2019   HDL 44 02/23/2019   CHOLHDL 4.6 02/23/2019   VLDL 26 02/23/2019   LDLCALC 133 (H) 02/23/2019   LDLCALC 150 (H) 07/05/2015    Physical Findings: AIMS: Facial and Oral Movements Muscles of Facial Expression: None, normal Lips and Perioral Area: None, normal Jaw: None, normal Tongue: None, normal,Extremity Movements Upper (arms, wrists, hands, fingers): None, normal Lower (legs, knees, ankles, toes): None, normal, Trunk Movements Neck, shoulders, hips: None, normal, Overall Severity Severity of abnormal movements (highest score from questions above): None, normal Incapacitation due to abnormal movements: None, normal Patient's awareness of abnormal movements (rate only patient's report): No Awareness, Dental Status Current problems with teeth and/or dentures?: No Does patient usually wear dentures?: No  CIWA:    COWS:    Musculoskeletal: Strength & Muscle Tone: within normal limits Gait & Station: normal Patient leans: N/A  Psychiatric Specialty Exam: Physical Exam  ROS  Blood pressure 130/90, pulse 81, temperature 98 F (36.7 C), resp. rate 16, last menstrual period 10/29/2011, SpO2 97 %.There is no height or weight on file to calculate BMI.  General Appearance: Casual  Eye Contact:  Minimal  Speech:  Slow  Volume:  Decreased  Mood:  labile  Affect:  Labile  Thought Process:   Irrelevant and Descriptions of Associations: Loose  Orientation: General person place and situation  Thought Content:  Delusions and Tangential  Suicidal Thoughts:  No  Homicidal Thoughts:  No  Memory:  Recent;   Fair  Judgement:  Fair  Insight:  Fair  Psychomotor Activity: lower  Concentration:  Concentration: Fair  Recall:  [place] of Knowledge:  Fair  Language:  Fair  Akathisia:  Negative  Handed:  Right  AIMS (if indicated):     Assets:  Social Support  ADL's:  Intact  Cognition:  WNL  Sleep:  Number of Hours: 6.75     Treatment Plan Summary: Daily contact with patient to assess and evaluate symptoms and progress in treatment and Medication management  Continue current precautions continue to work with social work continue reality-based therapy continue antipsychotic therapy without adjustment continue reality-based therapy and standard warnings given today  Kye Silverstein, MD 06/03/2019, 12:41 PM
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
## 2019-06-03 NOTE — Tx Team (Signed)
Interdisciplinary Treatment and Diagnostic Plan Update  06/03/2019 Time of Session: 09:16am [name] MRN: [id number]  Principal Diagnosis: <principal problem not specified>  Secondary Diagnoses: Active Problems:   Schizophrenia ([place])   Current Medications:  Current Facility-Administered Medications  Medication Dose Route Frequency Provider Last Rate Last Dose  . acetaminophen (TYLENOL) tablet 650 mg  650 mg Oral Q6H PRN Mordecai Maes, NP   650 mg at 06/02/19 0955  . alum & mag hydroxide-simeth (MAALOX/MYLANTA) 200-200-20 MG/5ML suspension 30 mL  30 mL Oral Q4H PRN Mordecai Maes, NP      . benztropine (COGENTIN) tablet 0.5 mg  0.5 mg Oral BID Cobos, Myer Peer, MD   0.5 mg at 06/01/19 1759  . fluPHENAZine (PROLIXIN) tablet 5 mg  5 mg Oral TID Johnn Hai, MD      . LORazepam (ATIVAN) tablet 1 mg  1 mg Oral Q8H PRN Cobos, Myer Peer, MD   1 mg at 06/02/19 1528  . OLANZapine zydis (ZYPREXA) disintegrating tablet 15 mg  15 mg Oral Q8H PRN Johnn Hai, MD   15 mg at 06/02/19 1528   And  . LORazepam (ATIVAN) tablet 1 mg  1 mg Oral PRN Johnn Hai, MD       And  . ziprasidone (GEODON) injection 10 mg  10 mg Intramuscular PRN Johnn Hai, MD      . temazepam (RESTORIL) capsule 30 mg  30 mg Oral QHS Johnn Hai, MD       PTA Medications: Medications Prior to Admission  Medication Sig Dispense Refill Last Dose  . diazepam (VALIUM) 5 MG tablet Take 30 minutes prior to MRI. May take 2nd dose if needed. 2 tablet 0   . gabapentin (NEURONTIN) 100 MG capsule Take 1 capsule (100 mg total) by mouth 3 (three) times daily. 90 capsule 5     Patient Stressors: Financial difficulties Occupational concerns  Patient Strengths: Ability for insight Capable of independent living  Treatment Modalities: Medication Management, Group therapy, Case management,  1 to 1 session with clinician, Psychoeducation, Recreational therapy.   Physician Treatment Plan for Primary Diagnosis: <principal  problem not specified> Long Term Goal(s): Improvement in symptoms so as ready for discharge Improvement in symptoms so as ready for discharge   Short Term Goals: Ability to identify changes in lifestyle to reduce recurrence of condition will improve Ability to verbalize feelings will improve Ability to disclose and discuss suicidal ideas Ability to demonstrate self-control will improve Ability to identify and develop effective coping behaviors will improve Ability to maintain clinical measurements within normal limits will improve Compliance with prescribed medications will improve Ability to identify changes in lifestyle to reduce recurrence of condition will improve Ability to verbalize feelings will improve Ability to disclose and discuss suicidal ideas Ability to demonstrate self-control will improve Ability to identify and develop effective coping behaviors will improve Ability to maintain clinical measurements within normal limits will improve Compliance with prescribed medications will improve  Medication Management: Evaluate patient's response, side effects, and tolerance of medication regimen.  Therapeutic Interventions: 1 to 1 sessions, Unit Group sessions and Medication administration.  Evaluation of Outcomes: Not Progressing  Physician Treatment Plan for Secondary Diagnosis: Active Problems:   Schizophrenia ([place])  Long Term Goal(s): Improvement in symptoms so as ready for discharge Improvement in symptoms so as ready for discharge   Short Term Goals: Ability to identify changes in lifestyle to reduce recurrence of condition will improve Ability to verbalize feelings will improve Ability to disclose and discuss suicidal ideas  Ability to demonstrate self-control will improve Ability to identify and develop effective coping behaviors will improve Ability to maintain clinical measurements within normal limits will improve Compliance with prescribed medications will  improve Ability to identify changes in lifestyle to reduce recurrence of condition will improve Ability to verbalize feelings will improve Ability to disclose and discuss suicidal ideas Ability to demonstrate self-control will improve Ability to identify and develop effective coping behaviors will improve Ability to maintain clinical measurements within normal limits will improve Compliance with prescribed medications will improve     Medication Management: Evaluate patient's response, side effects, and tolerance of medication regimen.  Therapeutic Interventions: 1 to 1 sessions, Unit Group sessions and Medication administration.  Evaluation of Outcomes: Not Progressing   RN Treatment Plan for Primary Diagnosis: <principal problem not specified> Long Term Goal(s): Knowledge of disease and therapeutic regimen to maintain health will improve  Short Term Goals: Ability to participate in decision making will improve, Ability to verbalize feelings will improve, Ability to disclose and discuss suicidal ideas, Ability to identify and develop effective coping behaviors will improve and Compliance with prescribed medications will improve  Medication Management: RN will administer medications as ordered by provider, will assess and evaluate patient's response and provide education to patient for prescribed medication. RN will report any adverse and/or side effects to prescribing provider.  Therapeutic Interventions: 1 on 1 counseling sessions, Psychoeducation, Medication administration, Evaluate responses to treatment, Monitor vital signs and CBGs as ordered, Perform/monitor CIWA, COWS, AIMS and Fall Risk screenings as ordered, Perform wound care treatments as ordered.  Evaluation of Outcomes: Not Progressing   LCSW Treatment Plan for Primary Diagnosis: <principal problem not specified> Long Term Goal(s): Safe transition to appropriate next level of care at discharge, Engage patient in therapeutic  group addressing interpersonal concerns.  Short Term Goals: Engage patient in aftercare planning with referrals and resources and Increase skills for wellness and recovery  Therapeutic Interventions: Assess for all discharge needs, 1 to 1 time with Social worker, Explore available resources and support systems, Assess for adequacy in community support network, Educate family and significant other(s) on suicide prevention, Complete Psychosocial Assessment, Interpersonal group therapy.  Evaluation of Outcomes: Not Progressing   Progress in Treatment: Attending groups: No. Participating in groups: No. Taking medication as prescribed: No. Toleration medication: No. Family/Significant other contact made: No, will contact:  will contact if given consent to contact Patient understands diagnosis: No. Discussing patient identified problems/goals with staff: Yes. Medical problems stabilized or resolved: Yes. Denies suicidal/homicidal ideation: Yes. Issues/concerns per patient self-inventory: No. Other:   New problem(s) identified: No, Describe:  None  New Short Term/Long Term Goal(s): Medication stabilization, elimination of SI thoughts, and development of a comprehensive mental wellness plan.   Patient Goals:    Discharge Plan or Barriers: CSW will continue to follow up for appropriate referrals and possible discharge planning  Reason for Continuation of Hospitalization: Delusions  Depression Medication stabilization  Estimated Length of Stay: 3-5 days  Attendees: Patient: 06/03/2019   Physician: Dr. Johnn Hai, MD 06/03/2019   Nursing: Vladimir Faster, RN 06/03/2019   RN Care Manager: 06/03/2019   Social Worker: Ardelle Anton, LCSW 06/03/2019   Recreational Therapist:  06/03/2019  Other:  06/03/2019   Other:  06/03/2019   Other: 06/03/2019      Scribe for Treatment Team: Trecia Rogers, LCSW 06/03/2019 10:12 AM

## 2019-06-03 NOTE — Progress Notes (Signed)
Patient ID: [name], female   DOB: 1966-02-13, 53 y.o.   MRN: [id number] D: Patient sleeping in room on approach. When pt came out she demanded gabapentin. When asked why she stated it helps with the convulsions. pt did not ask for anything else and went back to her room. Denies  SI/HI/AVH and pain. A: Support and encouragement offered as needed to express needs.  R: Patient is safe and cooperative on unit. Will continue to monitor  for safety and stability.

## 2019-06-03 NOTE — Progress Notes (Signed)
D: Pt denies SI/HI/AVH. Pt stated she was having convulsions and needed a 1:1 . Pt was informed she had Ativan earlier and was given 30 mg Restoril and we would be monitoring her through the evening.   A: Pt was offered support and encouragement. Pt was given scheduled medications. Pt was encourage to attend groups. Q 15 minute checks were done for safety.   R: safety maintained on unit.

## 2019-06-04 DIAGNOSIS — F29 Unspecified psychosis not due to a substance or known physiological condition: Secondary | ICD-10-CM

## 2019-06-04 DIAGNOSIS — F209 Schizophrenia, unspecified: Principal | ICD-10-CM

## 2019-06-04 DIAGNOSIS — F22 Delusional disorders: Secondary | ICD-10-CM

## 2019-06-04 NOTE — Progress Notes (Signed)
D: Patient presents anxious and disorganized. She believes she is having seizures/convulsions and continues to request gabapentin to treat it. She states she was taking Gabapentin 300mg  TID. Patient denies SI/HI/AVH. Patient refused to fill out self-inventory. A: Patient checked q15 min, and checks reviewed. Reviewed medication changes with patient and educated on side effects. Educated patient on importance of attending group therapy sessions and educated on several coping skills. Encouarged participation in milieu through recreation therapy and attending meals with peers. Support and encouragement provided. Fluids offered. R: Patient sleeping deeply after assessment and has not come to receive her medication. Patient contracts for safety on the unit.

## 2019-06-04 NOTE — Progress Notes (Signed)
   06/04/19 1046  COVID-19 Daily Checkoff  Have you had a fever (temp > 37.80C/100F)  in the past 24 hours?  No  If you have had runny nose, nasal congestion, sneezing in the past 24 hours, has it worsened? No  COVID-19 EXPOSURE  Have you traveled outside the state in the past 14 days? No  Have you been in contact with someone with a confirmed diagnosis of COVID-19 or PUI in the past 14 days without wearing appropriate PPE? No  Have you been living in the same home as a person with confirmed diagnosis of COVID-19 or a PUI (household contact)? No  Have you been diagnosed with COVID-19? No

## 2019-06-04 NOTE — Progress Notes (Signed)
D: Pt denies SI/HI/AVH. Pt visible on the unit for short period of time this evening. Pt continues to be delusional with thought blocking episodes. Pt focused on food. Pt did not express having convulsions this evening.   A: Pt was offered support and encouragement. Pt was given scheduled medications. Pt was encourage to attend groups. Q 15 minute checks were done for safety.   R: safety maintained on unit.

## 2019-06-04 NOTE — Progress Notes (Signed)
Adult Psychoeducational Group Note  Date:  06/04/2019 Time:  10:32 PM  Group Topic/Focus:  Wrap-Up Group:   The focus of this group is to help patients review their daily goal of treatment and discuss progress on daily workbooks.  Participation Level:  Minimal  Participation Quality:  Inattentive  Affect:  Anxious  Cognitive:  Disorganized  Insight: Limited  Engagement in Group:  Limited  Modes of Intervention:  Discussion  Additional Comments:  Pt did not want to participate in evening group but did stay for the entire group session.  Candy Sledge 06/04/2019, 10:32 PM

## 2019-06-04 NOTE — Progress Notes (Signed)
Adult Psychoeducational Group Note  Date:  06/04/2019 Time:  12:41 AM  Group Topic/Focus:  Wrap-Up Group:   The focus of this group is to help patients review their daily goal of treatment and discuss progress on daily workbooks.  Participation Level:  Did Not Attend  Participation Quality:  Did not attend  Affect:  Did not attend  Cognitive:  Did not attend  Insight: None  Engagement in Group:  Did not attend  Modes of Intervention:  Did not attend  Additional Comments:  Pt did not attend evening wrap up group tonight.  Candy Sledge 06/04/2019, 12:41 AM

## 2019-06-04 NOTE — Progress Notes (Addendum)
[facility] MD Progress Note  06/04/2019 12:53 PM [name]  MRN:  [id number] Subjective:  "I am here because I am being harassed."  On encounter today, the patient is lying in her bed with her eyes closed.  When called by name she dangles on the side of the bed and offers appropriate greeting.  She continues to believe she is here for harrassment.  States the name of a female coworker whom she believes has been following her for since 1977 and caused her to become homeless.  She believes the named coworker has recruited other people to follow her as well.  Throughout the interview she repeatedly interjects, "I am here for harassment" and states if she saw this coworker she would pass out. Although she continues to verbalize fixed delusion, she is aware she alert and oriented to person, place and time of day.  She denies suicidal or homicidal ideations, audible or visual hallucinations and she does not appear to respond to internal stimulus.  Based on her presentation on admission, she has gradually improve.   She continues to request gabapentin by name for "my seizures and convulsions."  She states this medication was prescribed for her by Dr. Mariam Dollar, an outpatient neurologist for seizures.  She reports an unwitnessed seizures yesterday and believes she was given gabapentin to relieve her symptoms.  She describes the seizures as "internal convulsions" that are helped by medication and food.    She reports a headache rated 3/10, that has improved after taking tylenol. She denies nausea, vomiting, diarrhea or constipation or other medication side effects.  The patient is aware that she has been a lot and states, "I am here to rest and get my body right.".  Her appetite is good.      Principal Problem: Schizophrenia Diagnosis: Active Problems:   Schizophrenia ([place])  Total Time spent with patient: 15 minutes  Past Psychiatric History: Schizophrenia, Delusional Disorder, Major Depression  Past Medical  History:  Past Medical History:  Diagnosis Date  . Anxiety   . Delusional disorder ([place])   . Diabetes mellitus without complication ([place])   . Homelessness   . Hypertension   . Major depressive disorder, recurrent, severe with psychotic features ([place])   . Schizophrenia ([place])   . Tremors of nervous system    History reviewed. No pertinent surgical history. Family History:  Family History  Problem Relation Age of Onset  . Hypertension Mother   . Diabetes Mother   . Aneurysm Mother   . Hypertension Father   . Diabetes Father   . Hypertension Sister   . Diabetes Sister   . Hypertension Other   . Diabetes Other    Family Psychiatric  History:  Social History:  Social History   Substance and Sexual Activity  Alcohol Use No     Social History   Substance and Sexual Activity  Drug Use No    Social History   Socioeconomic History  . Marital status: Single    Spouse name: Not on file  . Number of children: Not on file  . Years of education: Not on file  . Highest education level: Not on file  Occupational History  . Not on file  Social Needs  . Financial resource strain: Not on file  . Food insecurity    Worry: Not on file    Inability: Not on file  . Transportation needs    Medical: Not on file    Non-medical: Not on file  Tobacco Use  .  Smoking status: Never Smoker  . Smokeless tobacco: Never Used  Substance and Sexual Activity  . Alcohol use: No  . Drug use: No  . Sexual activity: Never  Lifestyle  . Physical activity    Days per week: Not on file    Minutes per session: Not on file  . Stress: Not on file  Relationships  . Social Herbalist on phone: Not on file    Gets together: Not on file    Attends religious service: Not on file    Active member of club or organization: Not on file    Attends meetings of clubs or organizations: Not on file    Relationship status: Not on file  Other Topics Concern  . Not on file  Social History  Narrative   Lives alone in an apartment. Has steps up to apartment      Right handed      Highest level of edu- Some college      Unemployed   Additional Social History:                   Sleep: Good  Appetite:  Good  Current Medications: Current Facility-Administered Medications  Medication Dose Route Frequency Provider Last Rate Last Dose  . acetaminophen (TYLENOL) tablet 650 mg  650 mg Oral Q6H PRN Mordecai Maes, NP   650 mg at 06/04/19 1212  . alum & mag hydroxide-simeth (MAALOX/MYLANTA) 200-200-20 MG/5ML suspension 30 mL  30 mL Oral Q4H PRN Mordecai Maes, NP      . benztropine (COGENTIN) tablet 0.5 mg  0.5 mg Oral BID Wael Maestas, Myer Peer, MD   0.5 mg at 06/04/19 1212  . fluPHENAZine (PROLIXIN) tablet 5 mg  5 mg Oral TID Johnn Hai, MD   5 mg at 06/04/19 1213  . LORazepam (ATIVAN) tablet 1 mg  1 mg Oral Q8H PRN Rashaad Hallstrom, Myer Peer, MD   1 mg at 06/03/19 1902  . OLANZapine zydis (ZYPREXA) disintegrating tablet 15 mg  15 mg Oral Q8H PRN Johnn Hai, MD   15 mg at 06/03/19 1902   And  . ziprasidone (GEODON) injection 10 mg  10 mg Intramuscular PRN Johnn Hai, MD      . temazepam (RESTORIL) capsule 30 mg  30 mg Oral QHS Johnn Hai, MD   30 mg at 06/03/19 2137    Lab Results: No results found for this or any previous visit (from the past 37 hour(s)).  Blood Alcohol level:  Lab Results  Component Value Date   ETH <10 05/31/2019   ETH <10 58/06/9832    Metabolic Disorder Labs: Lab Results  Component Value Date   HGBA1C 6.8 (H) 02/23/2019   MPG 148.46 02/23/2019   MPG 143 (H) 08/30/2012   No results found for: PROLACTIN Lab Results  Component Value Date   CHOL 203 (H) 02/23/2019   TRIG 132 02/23/2019   HDL 44 02/23/2019   CHOLHDL 4.6 02/23/2019   VLDL 26 02/23/2019   LDLCALC 133 (H) 02/23/2019   LDLCALC 150 (H) 07/05/2015    Physical Findings: AIMS: Facial and Oral Movements Muscles of Facial Expression: None, normal Lips and Perioral Area:  None, normal Jaw: None, normal Tongue: None, normal,Extremity Movements Upper (arms, wrists, hands, fingers): None, normal Lower (legs, knees, ankles, toes): None, normal, Trunk Movements Neck, shoulders, hips: None, normal, Overall Severity Severity of abnormal movements (highest score from questions above): None, normal Incapacitation due to abnormal movements: None, normal Patient's awareness of abnormal  movements (rate only patient's report): No Awareness, Dental Status Current problems with teeth and/or dentures?: No Does patient usually wear dentures?: No  CIWA:    COWS:     Musculoskeletal: Strength & Muscle Tone: within normal limits Gait & Station: normal Patient leans: N/A  Psychiatric Specialty Exam: Physical Exam  Constitutional: She is oriented to person, place, and time. She appears well-developed and well-nourished.  HENT:  Head: Normocephalic.  Neck: Normal range of motion.  Cardiovascular: Normal rate.  Respiratory: Effort normal.  Musculoskeletal: Normal range of motion.  Neurological: She is alert and oriented to person, place, and time.    Review of Systems  Psychiatric/Behavioral:       Patient demonstrates delusional behaviors    Blood pressure 130/90, pulse 81, temperature 98 F (36.7 C), resp. rate 16, last menstrual period 10/29/2011, SpO2 97 %.There is no height or weight on file to calculate BMI.  General Appearance: Disheveled  Eye Contact:  Fair  Speech:  Slow  Volume:  Normal  Mood:  Depressed  Affect:  Flat  Thought Process:  [place] and Descriptions of Associations: Loose  Orientation:  Full (Time, Place, and Person)  Thought Content:  Illogical, Delusions and Rumination  Suicidal Thoughts:  No  Homicidal Thoughts:  No  Memory:  Immediate;   Fair Recent;   Fair Remote;   Fair  Judgement:  Fair  Insight:  Lacking  Psychomotor Activity:  Decreased  Concentration:  Concentration: Fair and Attention Span: Fair  Recall:  [employer] of Knowledge:  Concentration: Fair  Language:  Good  Akathisia:  Negative  Handed:  Right  AIMS (if indicated):     Assets:  Desire for Improvement Resilience Social Support  ADL's:  Impaired  Cognition:  Impaired,  Moderate  Sleep:  Number of Hours: 6.75     Treatment Plan Summary: Daily contact with patient to assess and evaluate symptoms and progress in treatment and Medication management   Psychosis: Fluphenazine 5mg  po TID Benztropine   0.5mg  po BID  Sleep: Temazepam  30mg  QHS  Continue current precautions; continue to work with social work continue reality-based therapy continue antipsychotic therapy without adjustment continue reality-based therapy    Mallie Darting, NP 06/04/2019, 12:53 PM   Attest to NP Progress Note

## 2019-06-05 LAB — HEMOGLOBIN A1C
Hgb A1c MFr Bld: 6.6 % — ABNORMAL HIGH (ref 4.8–5.6)
Mean Plasma Glucose: 142.72 mg/dL

## 2019-06-05 LAB — TSH: TSH: 3.81 u[IU]/mL (ref 0.350–4.500)

## 2019-06-05 LAB — LIPID PANEL
Cholesterol: 220 mg/dL — ABNORMAL HIGH (ref 0–200)
HDL: 39 mg/dL — ABNORMAL LOW (ref >40)
LDL Cholesterol: 139 mg/dL — ABNORMAL HIGH (ref 0–99)
Total CHOL/HDL Ratio: 5.6 RATIO
Triglycerides: 210 mg/dL — ABNORMAL HIGH (ref <150)
VLDL: 42 mg/dL — ABNORMAL HIGH (ref 0–40)

## 2019-06-05 MED ORDER — GABAPENTIN 100 MG PO CAPS
100.0000 mg | ORAL_CAPSULE | Freq: Two times a day (BID) | ORAL | Status: DC
  Administered 2019-06-05 – 2019-06-07 (×4): 100 mg via ORAL
  Filled 2019-06-05 (×8): qty 1

## 2019-06-05 MED ORDER — TEMAZEPAM 15 MG PO CAPS
15.0000 mg | ORAL_CAPSULE | Freq: Every day | ORAL | Status: DC
  Filled 2019-06-05: qty 2

## 2019-06-05 MED ORDER — GABAPENTIN 100 MG PO CAPS
100.0000 mg | ORAL_CAPSULE | Freq: Three times a day (TID) | ORAL | Status: DC
  Filled 2019-06-05 (×2): qty 1

## 2019-06-05 NOTE — Progress Notes (Signed)
Adult Psychoeducational Group Note  Date:  06/05/2019 Time:  11:53 PM   Group Topic/Focus:  Wrap-Up Group:   The focus of this group is to help patients review their daily goal of treatment and discuss progress on daily workbooks.  Participation Level:  Did Not Attend  Participation Quality:  Did not attend  Affect:  Did not attend  Cognitive:  Did not attend  Insight: None  Engagement in Group:  Did not attend  Modes of Intervention:  Did not attend  Additional Comments:  Pt did not attend evening wrap up group tonight.  Candy Sledge 06/05/2019, 11:53 PM

## 2019-06-05 NOTE — Progress Notes (Signed)
Patient ID: [name], female   DOB: 1966/01/14, 53 y.o.   MRN: [id number] D: Patient in her room coming out periodically to dayroom. Pt appears calm and cooperative. Pt reports she is doing well but refuse her medication stating only Depakote can help her. Pt mood and affect appeared depressed and flat. Pt denies SI/HI/AVH and pain. No acute distressed noted at this time.   A: Support and encouragement provided as needed. Pt encouraged to discuss feelings and come to staff with any question or concerns.   R: Patient remains safe on the unit.

## 2019-06-05 NOTE — Progress Notes (Signed)
D: Patient presents fearful of having "convulsions". 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 Patient denies SI/HI/AVH. She confabulated an elaborate story about how her husband cheated on her, but she was framed for another crime and that led her to develop seizures and being placed on gabapentin. Her speech was linear, but the content was delusional. Patient refused to fill out self-inventory. A: Patient checked q15 min, and checks reviewed. Reviewed medication changes with patient and educated on side effects. Educated patient on importance of attending group therapy sessions and educated on several coping skills. Encouarged participation in milieu through recreation therapy and attending meals with peers. Support and encouragement provided. Fluids offered. R: Patient receptive to education on medications, and is medication compliant. Patient contracts for safety on the unit.

## 2019-06-05 NOTE — Progress Notes (Signed)
[name] MD Progress Note  06/05/2019 2:57 PM [name]  MRN:  [id number] Subjective: Denies medication side effects.  Denies suicidal ideations.  Remains ruminative/focused on paranoid/delusional ideations.  Objective: I have reviewed chart notes and have met with patient Patient is a 53 year old female, presented to hospital with anxiety and paranoid ideations.  History of prior diagnosis of schizophrenia and prior psychiatric admissions.  Today patient presents alert, attentive, cooperative on approach.  Her presentation is improved compared to her initial admission and she no longer presents overtly agitated.  She does remain focused on delusional ideations.  She describes a convoluted series of events, essentially that she filed a harassment suit against a previous coworker many years ago and has been harassed herself and cyber stalked since then, with members of the community turning against her.  Patient becomes visibly anxious and animated when reviewing her concerns/ideations as above.  Otherwise appears noticeably calmer than she did on admission. She is requesting to start Neurontin which she has been on in the past and which she states has been helpful for anxiety and for "internal convulsions". Currently does not appear to be in any acute distress.  Tolerating medications well thus far (on Prolixin) Limited milieu participation Labs reviewed- TSH 3.8     Principal Problem: Schizophrenia Diagnosis: Active Problems:   Schizophrenia ([place])  Total Time spent with patient: 20 minutes  Past Psychiatric History: Schizophrenia, Delusional Disorder, Major Depression  Past Medical History:  Past Medical History:  Diagnosis Date  . Anxiety   . Delusional disorder ([place])   . Diabetes mellitus without complication ([place])   . Homelessness   . Hypertension   . Major depressive disorder, recurrent, severe with psychotic features ([place])   . Schizophrenia ([place])   . Tremors of nervous system    History reviewed. No pertinent surgical history. Family History:  Family History  Problem Relation Age of Onset  . Hypertension Mother   . Diabetes Mother   . Aneurysm Mother   . Hypertension Father   . Diabetes Father   . Hypertension Sister   . Diabetes Sister   . Hypertension Other   . Diabetes Other    Family Psychiatric  History:  Social History:  Social History   Substance and Sexual Activity  Alcohol Use No     Social History   Substance and Sexual Activity  Drug Use No    Social History   Socioeconomic History  . Marital status: Single    Spouse name: Not on file  . Number of children: Not on file  . Years of education: Not on file  . Highest education level: Not on file  Occupational History  . Not on file  Social Needs  . Financial resource strain: Not on file  . Food insecurity    Worry: Not on file    Inability: Not on file  . Transportation needs    Medical: Not on file    Non-medical: Not on file  Tobacco Use  . Smoking status: Never Smoker  . Smokeless tobacco: Never Used  Substance and Sexual Activity  . Alcohol use: No  . Drug use: No  . Sexual activity: Never  Lifestyle  . Physical activity    Days per week: Not on file    Minutes per session: Not on file  . Stress: Not on file  Relationships  . Social Herbalist on phone: Not on file    Gets together: Not on file  Attends religious service: Not on file    Active member of club or organization: Not on file    Attends meetings of clubs or organizations: Not on file    Relationship status: Not on file  Other Topics Concern  . Not on file  Social History Narrative   Lives alone in an apartment. Has steps up to apartment      Right handed      Highest level of edu- Some college      Unemployed   Additional Social History:   Sleep: Good  Appetite:  Good  Current Medications: Current Facility-Administered Medications  Medication Dose Route Frequency Provider  Last Rate Last Dose  . acetaminophen (TYLENOL) tablet 650 mg  650 mg Oral Q6H PRN Mordecai Maes, NP   650 mg at 06/05/19 1151  . alum & mag hydroxide-simeth (MAALOX/MYLANTA) 200-200-20 MG/5ML suspension 30 mL  30 mL Oral Q4H PRN Mordecai Maes, NP      . benztropine (COGENTIN) tablet 0.5 mg  0.5 mg Oral BID Cobos, Myer Peer, MD   0.5 mg at 06/05/19 1105  . fluPHENAZine (PROLIXIN) tablet 5 mg  5 mg Oral TID Johnn Hai, MD   5 mg at 06/05/19 1151  . gabapentin (NEURONTIN) capsule 100 mg  100 mg Oral TID Cobos, Fernando A, MD      . LORazepam (ATIVAN) tablet 1 mg  1 mg Oral Q8H PRN Cobos, Myer Peer, MD   1 mg at 06/05/19 1348  . OLANZapine zydis (ZYPREXA) disintegrating tablet 15 mg  15 mg Oral Q8H PRN Johnn Hai, MD   15 mg at 06/03/19 1902   And  . ziprasidone (GEODON) injection 10 mg  10 mg Intramuscular PRN Johnn Hai, MD      . temazepam (RESTORIL) capsule 15 mg  15 mg Oral QHS Cobos, Myer Peer, MD        Lab Results:  Results for orders placed or performed during the hospital encounter of 06/01/19 (from the past 48 hour(s))  Lipid panel     Status: Abnormal   Collection Time: 06/05/19  6:41 AM  Result Value Ref Range   Cholesterol 220 (H) 0 - 200 mg/dL   Triglycerides 210 (H) <150 mg/dL   HDL 39 (L) >40 mg/dL   Total CHOL/HDL Ratio 5.6 RATIO   VLDL 42 (H) 0 - 40 mg/dL   LDL Cholesterol 139 (H) 0 - 99 mg/dL    Comment:        Total Cholesterol/HDL:CHD Risk Coronary Heart Disease Risk Table                     Men   Women  1/2 Average Risk   3.4   3.3  Average Risk       5.0   4.4  2 X Average Risk   9.6   7.1  3 X Average Risk  23.4   11.0        Use the calculated Patient Ratio above and the CHD Risk Table to determine the patient's CHD Risk.        ATP III CLASSIFICATION (LDL):  <100     mg/dL   Optimal  100-129  mg/dL   Near or Above                    Optimal  130-159  mg/dL   Borderline  160-189  mg/dL   High  >190     mg/dL   Very  High Performed at  [facility], [place] [address], [place], [place] [address]   TSH     Status: None   Collection Time: 06/05/19  6:41 AM  Result Value Ref Range   TSH 3.810 0.350 - 4.500 uIU/mL    Comment: Performed by a 3rd Generation assay with a functional sensitivity of <=0.01 uIU/mL. Performed at [facility], [place] [address], [place], [place] [address]     Blood Alcohol level:  Lab Results  Component Value Date   ETH <10 05/31/2019   ETH <10 10/29/7251    Metabolic Disorder Labs: Lab Results  Component Value Date   HGBA1C 6.8 (H) 02/23/2019   MPG 148.46 02/23/2019   MPG 143 (H) 08/30/2012   No results found for: PROLACTIN Lab Results  Component Value Date   CHOL 220 (H) 06/05/2019   TRIG 210 (H) 06/05/2019   HDL 39 (L) 06/05/2019   CHOLHDL 5.6 06/05/2019   VLDL 42 (H) 06/05/2019   LDLCALC 139 (H) 06/05/2019   LDLCALC 133 (H) 02/23/2019    Physical Findings: AIMS: Facial and Oral Movements Muscles of Facial Expression: None, normal Lips and Perioral Area: None, normal Jaw: None, normal Tongue: None, normal,Extremity Movements Upper (arms, wrists, hands, fingers): None, normal Lower (legs, knees, ankles, toes): None, normal, Trunk Movements Neck, shoulders, hips: None, normal, Overall Severity Severity of abnormal movements (highest score from questions above): None, normal Incapacitation due to abnormal movements: None, normal Patient's awareness of abnormal movements (rate only patient's report): No Awareness, Dental Status Current problems with teeth and/or dentures?: No Does patient usually wear dentures?: No  CIWA:    COWS:     Musculoskeletal: Strength & Muscle Tone: within normal limits Gait & Station: normal Patient leans: N/A  Psychiatric Specialty Exam: Physical Exam  Constitutional: She is oriented to person, place, and time. She appears well-developed and well-nourished.  HENT:  Head: Normocephalic.  Neck: Normal range  of motion.  Cardiovascular: Normal rate.  Respiratory: Effort normal.  Musculoskeletal: Normal range of motion.  Neurological: She is alert and oriented to person, place, and time.    Review of Systems  Psychiatric/Behavioral:       Patient demonstrates delusional behaviors  Denies chest pain or shortness of breath, no cough, no fever, no chills  Blood pressure 130/90, pulse 81, temperature 98 F (36.7 C), resp. rate 16, last menstrual period 10/29/2011, SpO2 97 %.There is no height or weight on file to calculate BMI.  General Appearance: Fairly Groomed  Eye Contact:  Fair  Speech:  Normal Rate  Volume:  Normal  Mood:  Acknowledges some improvement compared to admission  Affect:  Intermittently anxious/animated, particularly when focusing on delusional ideations  Thought Process:  [place] and Descriptions of Associations: Loose  Orientation:  Other:  Fully alert and attentive  Thought Content:  Denies hallucinations,  delusions as above  Suicidal Thoughts:  No denies suicidal or self-injurious ideations, also denies homicidal or violent ideations  Homicidal Thoughts:  No  Memory:  Recent and remote grossly intact  Judgement:  Fair  Insight:  Limited  Psychomotor Activity:  Normal  Concentration:  Concentration: Fair and Attention Span: Fair  Recall:  Good  Fund of Knowledge: Good  Language:  Good  Akathisia:  Negative  Handed:  Right  AIMS (if indicated):     Assets:  Desire for Improvement Resilience Social Support  ADL's:  Impaired  Cognition:  Impaired,  Moderate  Sleep:  Number of Hours: 7.5  Assessment:  Patient is a 53 year old female, presented to hospital with anxiety and paranoid ideations.  History of prior diagnosis of schizophrenia and prior psychiatric admissions.  Today patient presents with partial improvement compared to her admission presentation and is noted to be less disorganized/less agitated.  She does continue to ruminate and focus on  delusional ideations as described above.  Currently denies suicidal ideations.  Tolerating medications well thus far.  She requests to be restarted on Neurontin which he states has been helpful and well-tolerated medication for her. Treatment Plan Summary: Daily contact with patient to assess and evaluate symptoms and progress in treatment and Medication management  Treatment plan reviewed as below today 8/9 Treatment team working on disposition planning options Encourage group and milieu participation Continue fluphenazine 31m po TID for psychosis Continue benztropine   0.511mpo BID to minimize antipsychotic side effects Decrease temazepam to 15 QHS for insomnia ( to minimize risk of excessive sedation)  Start Neurontin 100 mgrs BID for anxiety, pain   Continue current precautions; continue to work with social work continue reality-based therapy continue antipsychotic therapy without adjustment continue reality-based therapy    FeJenne CampusMD 06/05/2019, 2:57 PMPatient ID: Jamie Pangfemale   DOB: [birth year].o.   MRN: [id number]

## 2019-06-05 NOTE — Progress Notes (Signed)
   06/05/19 1111  COVID-19 Daily Checkoff  Have you had a fever (temp > 37.80C/100F)  in the past 24 hours?  No  If you have had runny nose, nasal congestion, sneezing in the past 24 hours, has it worsened? No  COVID-19 EXPOSURE  Have you traveled outside the state in the past 14 days? No  Have you been in contact with someone with a confirmed diagnosis of COVID-19 or PUI in the past 14 days without wearing appropriate PPE? No  Have you been living in the same home as a person with confirmed diagnosis of COVID-19 or a PUI (household contact)? No  Have you been diagnosed with COVID-19? No

## 2019-06-06 NOTE — Progress Notes (Signed)
[facility] MD Progress Note  06/06/2019 4:10 PM [name]  MRN:  [id number] Subjective:   Patient in her room not taking all of her medications and in fact still delusional believing the person next-door cyber stalking her this tends to be a continual delusion of hers but she does not have thoughts of harming herself or others at this point and she can contract here but again she is still very delusional.  Denies hallucinations Principal Problem: Continued psychosis Diagnosis: Active Problems:   Schizophrenia ([place])  Total Time spent with patient: 20 minutes  Past Medical History:  Past Medical History:  Diagnosis Date  . Anxiety   . Delusional disorder ([place])   . Diabetes mellitus without complication ([place])   . Homelessness   . Hypertension   . Major depressive disorder, recurrent, severe with psychotic features ([place])   . Schizophrenia ([place])   . Tremors of nervous system    History reviewed. No pertinent surgical history. Family History:  Family History  Problem Relation Age of Onset  . Hypertension Mother   . Diabetes Mother   . Aneurysm Mother   . Hypertension Father   . Diabetes Father   . Hypertension Sister   . Diabetes Sister   . Hypertension Other   . Diabetes Other     Social History:  Social History   Substance and Sexual Activity  Alcohol Use No     Social History   Substance and Sexual Activity  Drug Use No    Social History   Socioeconomic History  . Marital status: Single    Spouse name: Not on file  . Number of children: Not on file  . Years of education: Not on file  . Highest education level: Not on file  Occupational History  . Not on file  Social Needs  . Financial resource strain: Not on file  . Food insecurity    Worry: Not on file    Inability: Not on file  . Transportation needs    Medical: Not on file    Non-medical: Not on file  Tobacco Use  . Smoking status: Never Smoker  . Smokeless tobacco: Never Used  Substance and Sexual  Activity  . Alcohol use: No  . Drug use: No  . Sexual activity: Never  Lifestyle  . Physical activity    Days per week: Not on file    Minutes per session: Not on file  . Stress: Not on file  Relationships  . Social Herbalist on phone: Not on file    Gets together: Not on file    Attends religious service: Not on file    Active member of club or organization: Not on file    Attends meetings of clubs or organizations: Not on file    Relationship status: Not on file  Other Topics Concern  . Not on file  Social History Narrative   Lives alone in an apartment. Has steps up to apartment      Right handed      Highest level of edu- Some college      Unemployed   Additional Social History:                         Sleep: Fair  Appetite:  Fair  Current Medications: Current Facility-Administered Medications  Medication Dose Route Frequency Provider Last Rate Last Dose  . acetaminophen (TYLENOL) tablet 650 mg  650 mg Oral Q6H PRN Marcello Moores,  Tinnie Gens, NP   650 mg at 06/05/19 1151  . alum & mag hydroxide-simeth (MAALOX/MYLANTA) 200-200-20 MG/5ML suspension 30 mL  30 mL Oral Q4H PRN Mordecai Maes, NP      . benztropine (COGENTIN) tablet 0.5 mg  0.5 mg Oral BID Cobos, Myer Peer, MD   0.5 mg at 06/05/19 1647  . fluPHENAZine (PROLIXIN) tablet 5 mg  5 mg Oral TID Johnn Hai, MD   5 mg at 06/05/19 1647  . gabapentin (NEURONTIN) capsule 100 mg  100 mg Oral BID Cobos, Myer Peer, MD   100 mg at 06/06/19 0810  . LORazepam (ATIVAN) tablet 1 mg  1 mg Oral Q8H PRN Cobos, Myer Peer, MD   1 mg at 06/05/19 1348  . OLANZapine zydis (ZYPREXA) disintegrating tablet 15 mg  15 mg Oral Q8H PRN Johnn Hai, MD   15 mg at 06/03/19 1902   And  . ziprasidone (GEODON) injection 10 mg  10 mg Intramuscular PRN Johnn Hai, MD      . temazepam (RESTORIL) capsule 15 mg  15 mg Oral QHS Cobos, Myer Peer, MD        Lab Results:  Results for orders placed or performed during the  hospital encounter of 06/01/19 (from the past 48 hour(s))  Lipid panel     Status: Abnormal   Collection Time: 06/05/19  6:41 AM  Result Value Ref Range   Cholesterol 220 (H) 0 - 200 mg/dL   Triglycerides 210 (H) <150 mg/dL   HDL 39 (L) >40 mg/dL   Total CHOL/HDL Ratio 5.6 RATIO   VLDL 42 (H) 0 - 40 mg/dL   LDL Cholesterol 139 (H) 0 - 99 mg/dL    Comment:        Total Cholesterol/HDL:CHD Risk Coronary Heart Disease Risk Table                     Men   Women  1/2 Average Risk   3.4   3.3  Average Risk       5.0   4.4  2 X Average Risk   9.6   7.1  3 X Average Risk  23.4   11.0        Use the calculated Patient Ratio above and the CHD Risk Table to determine the patient's CHD Risk.        ATP III CLASSIFICATION (LDL):  <100     mg/dL   Optimal  100-129  mg/dL   Near or Above                    Optimal  130-159  mg/dL   Borderline  160-189  mg/dL   High  >190     mg/dL   Very High Performed at [facility] [address] [place], [place] [address]   Hemoglobin A1c     Status: Abnormal   Collection Time: 06/05/19  6:41 AM  Result Value Ref Range   Hgb A1c MFr Bld 6.6 (H) 4.8 - 5.6 %    Comment: (NOTE) Pre diabetes:          5.7%-6.4% Diabetes:              >6.4% Glycemic control for   <7.0% adults with diabetes    Mean Plasma Glucose 142.72 mg/dL    Comment: Performed at [facility] [address], [place], [place] [address]  TSH     Status: None   Collection Time:  06/05/19  6:41 AM  Result Value Ref Range   TSH 3.810 0.350 - 4.500 uIU/mL    Comment: Performed by a 3rd Generation assay with a functional sensitivity of <=0.01 uIU/mL. Performed at [facility] [address], [place], [place] [address]     Blood Alcohol level:  Lab Results  Component Value Date   [facility] <10 05/31/2019   ETH <10 90/30/0923    Metabolic Disorder Labs: Lab Results  Component Value Date   HGBA1C 6.6 (H) 06/05/2019   MPG 142.72  06/05/2019   MPG 148.46 02/23/2019   No results found for: PROLACTIN Lab Results  Component Value Date   CHOL 220 (H) 06/05/2019   TRIG 210 (H) 06/05/2019   HDL 39 (L) 06/05/2019   CHOLHDL 5.6 06/05/2019   VLDL 42 (H) 06/05/2019   LDLCALC 139 (H) 06/05/2019   LDLCALC 133 (H) 02/23/2019    Physical Findings: AIMS: Facial and Oral Movements Muscles of Facial Expression: None, normal Lips and Perioral Area: None, normal Jaw: None, normal Tongue: None, normal,Extremity Movements Upper (arms, wrists, hands, fingers): None, normal Lower (legs, knees, ankles, toes): None, normal, Trunk Movements Neck, shoulders, hips: None, normal, Overall Severity Severity of abnormal movements (highest score from questions above): None, normal Incapacitation due to abnormal movements: None, normal Patient's awareness of abnormal movements (rate only patient's report): No Awareness, Dental Status Current problems with teeth and/or dentures?: No Does patient usually wear dentures?: No  CIWA:    COWS:     Musculoskeletal: Strength & Muscle Tone: within normal limits Gait & Station: normal Patient leans: N/A  Psychiatric Specialty Exam: Physical Exam  ROS  Blood pressure 130/90, pulse 81, temperature 98 F (36.7 C), resp. rate 16, last menstrual period 10/29/2011, SpO2 97 %.There is no height or weight on file to calculate BMI.  General Appearance: Casual  Eye Contact:  Fair  Speech:  Normal Rate  Volume:  Decreased  Mood:  Anxious and Depressed  Affect:  Appropriate and Congruent  Thought Process:  Irrelevant and Descriptions of Associations: Loose  Orientation:  Full (Time, Place, and Person)  Thought Content:  Illogical and Delusions  Suicidal Thoughts:  No  Homicidal Thoughts:  No  Memory:  Immediate;   Fair  Judgement:  Impaired  Insight:  Shallow  Psychomotor Activity:  Normal  Concentration:  Concentration: Fair  Recall:  [employer] of Knowledge:  Fair  Language:  Fair   Akathisia:  Negative  Handed:  Right  AIMS (if indicated):     Assets:  Physical Health Resilience  ADL's:  Intact  Cognition:  WNL  Sleep:  Number of Hours: 6     Treatment Plan Summary: Daily contact with patient to assess and evaluate symptoms and progress in treatment, Medication management and Plan Continue reality-based therapy encourage compliance again she is not taking her antipsychotics as ordered but we will again encourage compliance hopefully may start to improve with that and will need some degree of long-acting injectable going forward  Nachmen Mansel, MD 06/06/2019, 4:10 PM

## 2019-06-06 NOTE — Progress Notes (Signed)
Adult Psychoeducational Group Note  Date:  06/06/2019 Time:  11:15 PM  Group Topic/Focus:  Wrap-Up Group:   The focus of this group is to help patients review their daily goal of treatment and discuss progress on daily workbooks.  Participation Level:  Minimal  Participation Quality:  Appropriate  Affect:  Appropriate  Cognitive:  Appropriate  Insight: Appropriate and Limited  Engagement in Group:  Developing/Improving  Modes of Intervention:  Discussion  Additional Comments: Pt stated her goal for today was to focus on her treatment plan and get some rest. Pt stated she felt she accomplished her goal today. Pt stated her relationship with her family has stayed the same since she was admitted here. Pt stated she felt better about herself today. Pt rated her overall day a 5 out of 10. Pt stated her appetite was pretty good today. Pt stated her goal for tonight is to get some rest. Pt did complain of pain all over her body. Pt nurse was made aware of the situation. Pt stated she would alert staff if anything changes.   Candy Sledge 06/06/2019, 11:15 PM

## 2019-06-06 NOTE — [facility] Group Notes (Signed)
[facility] LCSW Group Therapy Note  Date/Time: 06/06/2019 @ 2:00pm  Type of Therapy and Topic:  Group Therapy:  Overcoming Obstacles  Participation Level:  Did not attend   Description of Group:    In this group patients will be encouraged to explore what they see as obstacles to their own wellness and recovery. They will be guided to discuss their thoughts, feelings, and behaviors related to these obstacles. The group will process together ways to cope with barriers, with attention given to specific choices patients can make. Each patient will be challenged to identify changes they are motivated to make in order to overcome their obstacles. This group will be process-oriented, with patients participating in exploration of their own experiences as well as giving and receiving support and challenge from other group members.  Therapeutic Goals: 1. Patient will identify personal and current obstacles as they relate to admission. 2. Patient will identify barriers that currently interfere with their wellness or overcoming obstacles.  3. Patient will identify feelings, thought process and behaviors related to these barriers. 4. Patient will identify two changes they are willing to make to overcome these obstacles:    Summary of Patient Progress   Patient did not attend group today.    Therapeutic Modalities:   Cognitive Behavioral Therapy Solution Focused Therapy Motivational Interviewing Relapse Prevention Therapy   Ardelle Anton, LCSW

## 2019-06-07 MED ORDER — FLUPHENAZINE HCL 5 MG PO TABS: 15.0000 mg | ORAL_TABLET | Freq: Every day | ORAL | 2 refills | Status: DC

## 2019-06-07 MED ORDER — GABAPENTIN 100 MG PO CAPS: 100.0000 mg | ORAL_CAPSULE | Freq: Three times a day (TID) | ORAL | 1 refills | Status: DC

## 2019-06-07 MED ORDER — TEMAZEPAM 30 MG PO CAPS: 30.0000 mg | ORAL_CAPSULE | Freq: Every day | ORAL | 0 refills | Status: DC

## 2019-06-07 MED ORDER — BENZTROPINE MESYLATE 0.5 MG PO TABS: 0.5000 mg | ORAL_TABLET | Freq: Two times a day (BID) | ORAL | 1 refills | Status: DC

## 2019-06-07 NOTE — Progress Notes (Signed)
Patient ID: [name], female   DOB: 12/17/1965, 53 y.o.   MRN: [id number]   CSW scheduled [place] for 11:45am. Pt's nurse was notified.

## 2019-06-07 NOTE — BHH Suicide Risk Assessment (Signed)
[facility] Discharge Suicide Risk Assessment   Principal Problem: Exacerbation of underlying psychotic disorder Discharge Diagnoses: Active Problems:   Schizophrenia ([place])   Total Time spent with patient: 45 minutes  Musculoskeletal: Strength & Muscle Tone: within normal limits Gait & Station: normal Patient leans: N/A  Psychiatric Specialty Exam: ROS  Blood pressure 114/81, pulse 95, temperature 97.9 F (36.6 C), temperature source Oral, resp. rate 16, last menstrual period 10/29/2011, SpO2 97 %.There is no height or weight on file to calculate BMI.  General Appearance: Casual and Disheveled  Eye Contact::  Good  Speech:  Clear and Coherent409  Volume:  Normal  Mood:  Euthymic  Affect:  Full Range  Thought Process:  Irrelevant and Descriptions of Associations: Tangential  Orientation:  Full (Time, Place, and Person)  Thought Content:  Delusions persistent and fixed delusions unresponsive to meds  Suicidal Thoughts:  No  Homicidal Thoughts:  No  Memory:  Recent;   Fair  Judgement:  Fair  Insight:  Fair  Psychomotor Activity:  Normal  Concentration:  Fair  Recall:  [employer] of Knowledge:Fair  Language: Fair  Akathisia:  Negative  Handed:  Right  AIMS (if indicated):     Assets:  Communication Skills Desire for Improvement  Sleep:  Number of Hours: 0-however slept throughout the day  Cognition: WNL  ADL's:  Intact   Mental Status Per Nursing Assessment::   On Admission:  Self-harm behaviors  Demographic Factors:  Unemployed  Loss Factors: Decrease in vocational status  Historical Factors: Impulsivity  Risk Reduction Factors:   NA  Continued Clinical Symptoms:  Schizophrenia:   Paranoid or undifferentiated type  Cognitive Features That Contribute To Risk:  Polarized thinking    Suicide Risk:  Minimal: No identifiable suicidal ideation.  Patients presenting with no risk factors but with morbid ruminations; may be classified as minimal risk based on the  severity of the depressive symptoms  Follow-up [facility] Follow up.   Specialty: Catering manager information: [facility] [place] [address] [phone]           Plan Of Care/Follow-up recommendations:  Activity:  full  Parley Pidcock, MD 06/07/2019, 11:12 AM

## 2019-06-07 NOTE — Progress Notes (Signed)
[place] NOVEL CORONAVIRUS (COVID-19) DAILY CHECK-OFF SYMPTOMS - answer yes or no to each - every day NO YES  Have you had a fever in the past 24 hours?  . Fever (Temp > 37.80C / 100F) X   Have you had any of these symptoms in the past 24 hours? . New Cough .  Sore Throat  .  Shortness of Breath .  Difficulty Breathing .  Unexplained Body Aches   X   Have you had any one of these symptoms in the past 24 hours not related to allergies?   . Runny Nose .  Nasal Congestion .  Sneezing   X   If you have had runny nose, nasal congestion, sneezing in the past 24 hours, has it worsened?  X   EXPOSURES - check yes or no X   Have you traveled outside the state in the past 14 days?  X   Have you been in contact with someone with a confirmed diagnosis of COVID-19 or PUI in the past 14 days without wearing appropriate PPE?  X   Have you been living in the same home as a person with confirmed diagnosis of COVID-19 or a PUI (household contact)?    X   Have you been diagnosed with COVID-19?    X              What to do next: Answered NO to all: Answered YES to anything:   Proceed with unit schedule Follow the [facility] Inpatient Flowsheet.   

## 2019-06-07 NOTE — Progress Notes (Signed)
  [facility] Adult Case Management Discharge Plan :  Will you be returning to the same living situation after discharge:  Yes,  her apartment At discharge, do you have transportation home?: Yes,  [place] [employer] Do you have the ability to pay for your medications: Yes,  medicare  Release of information consent forms completed and in the chart;  Patient's signature needed at discharge.  Patient to Follow up at: Follow-up Information    [facility] Follow up.   Specialty: Professional Counselor Why: You will need to do a walk-in appointment at the office. You can go from Monday-Friday from 8am until 4:30pm. Please take your hospital discharge paperwork, photo ID and medication list.  Contact information: [facility] [place] [address] [phone]           Next level of care provider has access to [place] and Suicide Prevention discussed: Yes,  with patient  Have you used any form of tobacco in the last 30 days? (Cigarettes, Smokeless Tobacco, Cigars, and/or Pipes): No  Has patient been referred to the Quitline?: N/A patient is not a smoker  Patient has been referred for addiction treatment: Yes  Trecia Rogers, LCSW 06/07/2019, 11:16 AM

## 2019-06-07 NOTE — Progress Notes (Signed)
Pt discharged to lobby. Pt was stable and appreciative at that time. All papers and prescriptions were given and valuables returned. Verbal understanding expressed. Denies SI/HI and A/VH. Pt given opportunity to express concerns and ask questions.  
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
## 2019-06-09 ENCOUNTER — Other Ambulatory Visit: Payer: Self-pay

## 2019-06-09 ENCOUNTER — Other Ambulatory Visit: Payer: Medicare Other

## 2019-06-19 ENCOUNTER — Emergency Department (HOSPITAL_COMMUNITY)
Admission: EM | Admit: 2019-06-19 | Discharge: 2019-06-19 | Disposition: A | Discharge status: Home / Self Care | Payer: Medicare Other | Attending: Emergency Medicine | Admitting: Emergency Medicine

## 2019-06-19 ENCOUNTER — Other Ambulatory Visit: Payer: Self-pay

## 2019-06-19 ENCOUNTER — Encounter (HOSPITAL_COMMUNITY): Payer: Self-pay | Admitting: Emergency Medicine

## 2019-06-19 DIAGNOSIS — E119 Type 2 diabetes mellitus without complications: Secondary | ICD-10-CM | POA: Insufficient documentation

## 2019-06-19 DIAGNOSIS — Z9114 Patient's other noncompliance with medication regimen: Secondary | ICD-10-CM | POA: Insufficient documentation

## 2019-06-19 DIAGNOSIS — F22 Delusional disorders: Secondary | ICD-10-CM | POA: Diagnosis not present

## 2019-06-19 DIAGNOSIS — R51 Headache: Secondary | ICD-10-CM | POA: Insufficient documentation

## 2019-06-19 DIAGNOSIS — R519 Headache, unspecified: Secondary | ICD-10-CM

## 2019-06-19 DIAGNOSIS — I10 Essential (primary) hypertension: Secondary | ICD-10-CM | POA: Diagnosis not present

## 2019-06-19 DIAGNOSIS — F29 Unspecified psychosis not due to a substance or known physiological condition: Secondary | ICD-10-CM | POA: Diagnosis not present

## 2019-06-19 NOTE — ED Provider Notes (Signed)
[facility] EMERGENCY DEPARTMENT Provider Note   CSN: [id number] Arrival date & time: 06/19/19  [id number]     History   Chief Complaint Chief Complaint  Patient presents with  . Multiple Complaints    HPI [name] is a 53 y.o. female with past medical history of delusional disorder, anxiety, schizophrenia who presents with a 7-hour history of "bleeding from my inner scalp" and states that she can "smell blood".  Patient says that "my right forehead is protruding".  Patient denies any pain but endorses that it feels like a pressure in her head.  She denies any headache, nausea, vomiting, weakness.  Patient with recent admission for paranoid schizophrenia and discharged with psychiatric medications but now denies taking any medications other than gabapentin.  Patient repeatedly states that it is "my entire right side down to my nose" but denies any specific symptoms and when asked what she means she says that she has internal bleeding. Patient denies suicidal or homicidal ideation.     HPI  Past Medical History:  Diagnosis Date  . Anxiety   . Delusional disorder ([place])   . Diabetes mellitus without complication ([place])   . Homelessness   . Hypertension   . Major depressive disorder, recurrent, severe with psychotic features ([place])   . Schizophrenia ([place])   . Tremors of nervous system     Patient Active Problem List   Diagnosis Date Noted  . Major depressive disorder, recurrent episode, severe, with psychosis ([place])   . Schizophrenia ([place]) 03/26/2018  . Delusional disorder ([place]) 01/07/2018  . Anxiety 03/29/2016  . Homelessness 11/18/2015  . Tobacco use disorder 07/05/2015  . Hypokalemia 05/30/2015  . Obstipation 05/30/2015  . Prolonged QT interval 05/30/2015  . Hypertension 05/30/2015  . Suicidal ideations 03/12/2015  . Diabetes ([place]) 09/06/2012  . Paranoia ([place]) 04/16/2012    History reviewed. No pertinent surgical history.   OB History   No obstetric  history on file.      Home Medications    Prior to Admission medications   Medication Sig Start Date End Date Taking? Authorizing Provider  benztropine (COGENTIN) 0.5 MG tablet Take 1 tablet (0.5 mg total) by mouth 2 (two) times daily. 06/07/19   Johnn Hai, MD  fluPHENAZine (PROLIXIN) 5 MG tablet Take 3 tablets (15 mg total) by mouth at bedtime. 06/07/19   Johnn Hai, MD  gabapentin (NEURONTIN) 100 MG capsule Take 1 capsule (100 mg total) by mouth 3 (three) times daily. 06/07/19   Johnn Hai, MD  temazepam (RESTORIL) 30 MG capsule Take 1 capsule (30 mg total) by mouth at bedtime. 06/07/19   Johnn Hai, MD    Family History Family History  Problem Relation Age of Onset  . Hypertension Mother   . Diabetes Mother   . Aneurysm Mother   . Hypertension Father   . Diabetes Father   . Hypertension Sister   . Diabetes Sister   . Hypertension Other   . Diabetes Other     Social History Social History   Tobacco Use  . Smoking status: Never Smoker  . Smokeless tobacco: Never Used  Substance Use Topics  . Alcohol use: No  . Drug use: No     Allergies   Patient has no known allergies.   Review of Systems Review of Systems  Constitutional: Negative for chills and fever.  Respiratory: Negative for cough and shortness of breath.   Cardiovascular: Negative for chest pain.  Gastrointestinal: Negative for abdominal pain, diarrhea, nausea and  vomiting.  All other systems reviewed and are negative.    Physical Exam Updated Vital Signs BP (!) 144/85 (BP Location: Right Arm)   Pulse 72   Temp 98.9 F (37.2 C) (Oral)   Resp 16   Ht 5\' 5"  (1.651 m)   Wt 119 kg   LMP 10/29/2011 Comment: NEG U PREG 02/24/17  SpO2 98%   BMI 43.66 kg/m   Physical Exam Constitutional:      Appearance: She is well-developed.  HENT:     Head: Normocephalic and atraumatic.  Eyes:     Extraocular Movements: Extraocular movements intact.  Cardiovascular:     Rate and Rhythm: Normal rate  and regular rhythm.     Heart sounds: No murmur. No friction rub. No gallop.   Pulmonary:     Breath sounds: Normal breath sounds. No wheezing, rhonchi or rales.  Abdominal:     General: Abdomen is flat. There is no distension.     Palpations: Abdomen is soft. There is no mass.     Tenderness: There is no abdominal tenderness.  Musculoskeletal:        General: No swelling or tenderness.  Skin:    General: Skin is warm and dry.  Neurological:     General: No focal deficit present.     Mental Status: She is alert.     Cranial Nerves: No cranial nerve deficit.     Sensory: No sensory deficit.     Motor: No weakness.     Coordination: Coordination normal.  Psychiatric:        Mood and Affect: Mood normal.        Behavior: Behavior normal.      ED Treatments / Results  Labs (all labs ordered are listed, but only abnormal results are displayed) Labs Reviewed - No data to display  EKG None  Radiology No results found.  Procedures Procedures (including critical care time)  Medications Ordered in ED Medications - No data to display   Initial Impression / Assessment and Plan / ED Course  I have reviewed the triage vital signs and the nursing notes.  Pertinent labs & imaging results that were available during my care of the patient were reviewed by me and considered in my medical decision making (see chart for details).        [name] is a 53 year old female with significant psychiatric history and recent psychiatric admission, not taking psych meds, who presents with nonspecific symptoms similar to prior presentations.  Patient has had extensive prior imaging work-up including multiple CT heads and a CT angiogram head which have not shown any aneurysms, bleeds, swelling or other concerning abnormalities.  Patient has no focal neurological deficits or concerning symptoms that would suggest an intracranial bleed or other urgent medical concerns. Patient appears to have  similar fixed delusion on prior admissions. Patient was provided reassurance and was comfortable with outpatient followup.  On assessment, patient does not appear to be an immediate risk to self or others  Final Clinical Impressions(s) / ED Diagnoses   Final diagnoses:  None    ED Discharge Orders    None       Jeanmarie Hubert, MD 06/19/19 [id number]    Lajean Saver, MD 06/19/19 [phone]

## 2019-06-19 NOTE — ED Triage Notes (Addendum)
Pt arrives to ED from home with complaints of a pop in her head at 1am that woke her up. She took at Gabapentin to help then went back to sleep. Pt then stated that she woke back up at 530am and smelled blood.
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
## 2019-06-19 NOTE — Discharge Instructions (Addendum)
Today you were seen for some head discomfort you were feeling. We reviewed the prior imaging that you have had and we do not think that you need additional imaging at this time. It is important that you followup with your primary care provider so they can conduct a further evaluation. Please return to the emergency room if you develop a severe headache, new onset weakness, slurred speech, or any other concerning symptoms.  Thank you for allowing [name] to be part of your medical care

## 2019-06-19 NOTE — ED Notes (Signed)
Pt verbalized understanding of discharge instructions and follow up care

## 2019-06-19 NOTE — ED Notes (Signed)
Pt. Discharged, waiting for taxi at [place]. Pt asking for the nurse Kathlee Nations) Kathlee Nations came to the front . Pt was upset because she was not given any medication, Kathlee Nations explained to her the Dr. Kristeen Miss like it was not warranty. Pt threatened Kathlee Nations and myself that she was going to call her attorney for a harrassment claimed against [place]Marland Kitchen She had stated the [facility] had warned everyone about me and that's why nobody is treating me fairly. Pt came to me with the same complaint. Went to ask Dr. Delane Ginger if we could give the pt some tylenol for her headache. He agreed to the Tylenol. Came back to [facility] to inform the pt. And she had already left with a taxi.

## 2019-06-20 ENCOUNTER — Telehealth: Payer: Self-pay | Admitting: Neurology

## 2019-06-20 NOTE — Telephone Encounter (Signed)
Pt states that she wanted you to know she is doing good and taking her medication 3 times aday  Please call

## 2019-06-20 NOTE — Telephone Encounter (Signed)
Pt needs to have a open MRI in [place] now not in [place].   Please call patient about going to the ED this weekend and is still taking the gabapentin

## 2019-06-21 ENCOUNTER — Telehealth: Payer: Self-pay | Admitting: Neurology

## 2019-06-21 ENCOUNTER — Encounter (HOSPITAL_COMMUNITY): Payer: Self-pay

## 2019-06-21 ENCOUNTER — Emergency Department (HOSPITAL_COMMUNITY): Payer: Medicare Other

## 2019-06-21 ENCOUNTER — Other Ambulatory Visit: Payer: Self-pay

## 2019-06-21 ENCOUNTER — Emergency Department (HOSPITAL_COMMUNITY)
Admission: EM | Admit: 2019-06-21 | Discharge: 2019-06-21 | Disposition: A | Discharge status: Home / Self Care | Payer: Medicare Other | Attending: Emergency Medicine | Admitting: Emergency Medicine

## 2019-06-21 DIAGNOSIS — I10 Essential (primary) hypertension: Secondary | ICD-10-CM | POA: Insufficient documentation

## 2019-06-21 DIAGNOSIS — R509 Fever, unspecified: Secondary | ICD-10-CM | POA: Diagnosis not present

## 2019-06-21 DIAGNOSIS — R110 Nausea: Secondary | ICD-10-CM

## 2019-06-21 DIAGNOSIS — E119 Type 2 diabetes mellitus without complications: Secondary | ICD-10-CM | POA: Diagnosis not present

## 2019-06-21 DIAGNOSIS — J9811 Atelectasis: Secondary | ICD-10-CM | POA: Diagnosis not present

## 2019-06-21 LAB — CBC
HCT: 40.5 % (ref 36.0–46.0)
Hemoglobin: 12.6 g/dL (ref 12.0–15.0)
MCH: 26.1 pg (ref 26.0–34.0)
MCHC: 31.1 g/dL (ref 30.0–36.0)
MCV: 84 fL (ref 80.0–100.0)
Platelets: 340 10*3/uL (ref 150–400)
RBC: 4.82 MIL/uL (ref 3.87–5.11)
RDW: 13.6 % (ref 11.5–15.5)
WBC: 6.2 10*3/uL (ref 4.0–10.5)
nRBC: 0 % (ref 0.0–0.2)

## 2019-06-21 LAB — COMPREHENSIVE METABOLIC PANEL
ALT: 21 U/L (ref 0–44)
AST: 19 U/L (ref 15–41)
Albumin: 4.2 g/dL (ref 3.5–5.0)
Alkaline Phosphatase: 116 U/L (ref 38–126)
Anion gap: 9 (ref 5–15)
BUN: 16 mg/dL (ref 6–20)
CO2: 24 mmol/L (ref 22–32)
Calcium: 9.1 mg/dL (ref 8.9–10.3)
Chloride: 106 mmol/L (ref 98–111)
Creatinine, Ser: 1.01 mg/dL — ABNORMAL HIGH (ref 0.44–1.00)
GFR calc Af Amer: >60 mL/min (ref >60)
GFR calc non Af Amer: >60 mL/min (ref >60)
Glucose, Bld: 98 mg/dL (ref 70–99)
Potassium: 3.9 mmol/L (ref 3.5–5.1)
Sodium: 139 mmol/L (ref 135–145)
Total Bilirubin: 0.4 mg/dL (ref 0.3–1.2)
Total Protein: 7.7 g/dL (ref 6.5–8.1)

## 2019-06-21 LAB — URINALYSIS, ROUTINE W REFLEX MICROSCOPIC
Bilirubin Urine: NEGATIVE
Glucose, UA: NEGATIVE mg/dL
Hgb urine dipstick: NEGATIVE
Ketones, ur: NEGATIVE mg/dL
Nitrite: NEGATIVE
Protein, ur: NEGATIVE mg/dL
Specific Gravity, Urine: 1.024 (ref 1.005–1.030)
pH: 5 (ref 5.0–8.0)

## 2019-06-21 LAB — PREGNANCY, URINE: Preg Test, Ur: NEGATIVE

## 2019-06-21 MED ORDER — ONDANSETRON 4 MG PO TBDP: 4.0000 mg | ORAL_TABLET | Freq: Three times a day (TID) | ORAL | 0 refills | Status: DC | PRN

## 2019-06-21 MED ORDER — ONDANSETRON 4 MG PO TBDP
4.0000 mg | ORAL_TABLET | Freq: Once | ORAL | Status: AC
  Administered 2019-06-21: 4 mg via ORAL
  Filled 2019-06-21: qty 1

## 2019-06-21 NOTE — Telephone Encounter (Signed)
Error

## 2019-06-21 NOTE — ED Provider Notes (Signed)
[facility] Emergency Department Provider Note MRN:  [id number]  Arrival date & time: 06/21/19     Chief Complaint   Nausea   History of Present Illness   [name] is a 53 y.o. year-old female with a history of diabetes, schizophrenia presenting to the ED with chief complaint of nausea.  Endorsing nausea today and feels like she is going to experience convulsions.  Denies headache, no vomiting, no abdominal pain, no chest pain or shortness of breath, no numbness or weakness to the arms or legs.  Symptoms constant, no exacerbating relieving factors.  Took an extra gabapentin today without relief.  Review of Systems  A complete 10 system review of systems was obtained and all systems are negative except as noted in the HPI and PMH.   Patient's Health History    Past Medical History:  Diagnosis Date  . Anxiety   . Delusional disorder ([place])   . Diabetes mellitus without complication ([place])   . Homelessness   . Hypertension   . Major depressive disorder, recurrent, severe with psychotic features ([place])   . Schizophrenia ([place])   . Tremors of nervous system     History reviewed. No pertinent surgical history.  Family History  Problem Relation Age of Onset  . Hypertension Mother   . Diabetes Mother   . Aneurysm Mother   . Hypertension Father   . Diabetes Father   . Hypertension Sister   . Diabetes Sister   . Hypertension Other   . Diabetes Other     Social History   Socioeconomic History  . Marital status: Single    Spouse name: Not on file  . Number of children: Not on file  . Years of education: Not on file  . Highest education level: Not on file  Occupational History  . Not on file  Social Needs  . Financial resource strain: Not on file  . Food insecurity    Worry: Not on file    Inability: Not on file  . Transportation needs    Medical: Not on file    Non-medical: Not on file  Tobacco Use  . Smoking status: Never Smoker  .  Smokeless tobacco: Never Used  Substance and Sexual Activity  . Alcohol use: No  . Drug use: No  . Sexual activity: Never  Lifestyle  . Physical activity    Days per week: Not on file    Minutes per session: Not on file  . Stress: Not on file  Relationships  . Social Herbalist on phone: Not on file    Gets together: Not on file    Attends religious service: Not on file    Active member of club or organization: Not on file    Attends meetings of clubs or organizations: Not on file    Relationship status: Not on file  . Intimate partner violence    Fear of current or ex partner: Not on file    Emotionally abused: Not on file    Physically abused: Not on file    Forced sexual activity: Not on file  Other Topics Concern  . Not on file  Social History Narrative   Lives alone in an apartment. Has steps up to apartment      Right handed      Highest level of edu- Some college      Unemployed     Physical Exam  Vital Signs and Nursing Notes reviewed Vitals:  06/21/19 1830 06/21/19 1838  BP: 137/86 137/86  Pulse: 68 66  Resp:  15  Temp:    SpO2: 98% 100%    CONSTITUTIONAL: Well-appearing, NAD NEURO:  Alert and oriented x 3, no focal deficits EYES:  eyes equal and reactive ENT/NECK:  no LAD, no JVD CARDIO: Regular rate, well-perfused, normal S1 and S2 PULM:  CTAB no wheezing or rhonchi GI/GU:  normal bowel sounds, non-distended, non-tender MSK/SPINE:  No gross deformities, no edema SKIN:  no rash, atraumatic PSYCH:  Appropriate speech and behavior  Diagnostic and Interventional Summary    EKG Interpretation  Date/Time:  Tuesday June 21 2019 18:22:37 EDT Ventricular Rate:  66 PR Interval:    QRS Duration: 93 QT Interval:  415 QTC Calculation: 435 R Axis:   25 Text Interpretation:  Sinus rhythm Low voltage, precordial leads Borderline T abnormalities, anterior leads Confirmed by Gerlene Fee [phone]) on 06/21/2019 7:26:01 PM      Labs Reviewed   COMPREHENSIVE METABOLIC PANEL - Abnormal; Notable for the following components:      Result Value   Creatinine, Ser 1.01 (*)    All other components within normal limits  URINALYSIS, ROUTINE W REFLEX MICROSCOPIC - Abnormal; Notable for the following components:   APPearance HAZY (*)    Leukocytes,Ua SMALL (*)    Bacteria, UA MANY (*)    All other components within normal limits  CBC  PREGNANCY, URINE    DG ABD ACUTE 2+V W 1V CHEST  Final Result      Medications  ondansetron (ZOFRAN-ODT) disintegrating tablet 4 mg (4 mg Oral Given 06/21/19 1811)     Procedures Critical Care  ED Course and Medical Decision Making  I have reviewed the triage vital signs and the nursing notes.  Pertinent labs & imaging results that were available during my care of the patient were reviewed by me and considered in my medical decision making (see below for details).  Suspect viral illness as reason for patient's fever, nausea.  Denies cough, no dysuria, no rash, no meningismus on exam.  Will obtain labs, chest x-ray, urinalysis to evaluate for source of fever.  Anticipating discharge.  Work-up largely unrevealing, no clear source of infection.  Abdomen continues to be soft and nontender, no indication for advanced imaging.  Favoring mild viral gastritis.  Coronavirus possible but unlikely.  Patient agrees to continue home quarantine, use Zofran at home for nausea.  There is documentation from back in 2013 the patient has a prolonged QT interval.  I have personally reviewed multiple recent EKGs with no evidence of a prolonged QT interval.  Her QT interval today is within normal limits.  She is appropriate for Zofran for her symptoms.  Barth Kirks. Sedonia Small, [place] [email or web address]  Final Clinical Impressions(s) / ED Diagnoses     ICD-10-CM   1. Fever  R50.9 CANCELED: DG Chest Port 1 View    CANCELED: DG Chest Port 1 View  2. Nausea  R11.0 DG ABD  ACUTE 2+V W 1V CHEST    DG ABD ACUTE 2+V W 1V CHEST    ED Discharge Orders         Ordered    ondansetron (ZOFRAN ODT) 4 MG disintegrating tablet  Every 8 hours PRN     06/21/19 1947          Discharge Instructions Discussed with and Provided to Patient:   Discharge Instructions     You were evaluated in the Emergency  Department and after careful evaluation, we did not find any emergent condition requiring admission or further testing in the hospital.  Your symptoms today seem to be due to a viral illness.  You can use the nausea medication provided as directed.  Please return to the Emergency Department if you experience any worsening of your condition.  We encourage you to follow up with a primary care provider.  Thank you for allowing [name] to be a part of your care.        Maudie Flakes, MD 06/21/19 [phone]

## 2019-06-21 NOTE — Telephone Encounter (Signed)
Pt needs to talk to someone today about her needing medical treatment please call her

## 2019-06-21 NOTE — Telephone Encounter (Signed)
Left message for pt.  Informed that we were glad she was doing well and that we would send a order for her MRI to [facility] in [place] and that they would contact her with an appt date and time.  Fax number [phone]

## 2019-06-21 NOTE — Discharge Instructions (Addendum)
You were evaluated in the Emergency Department and after careful evaluation, we did not find any emergent condition requiring admission or further testing in the hospital.  Your symptoms today seem to be due to a viral illness.  You can use the nausea medication provided as directed.  Please return to the Emergency Department if you experience any worsening of your condition.  We encourage you to follow up with a primary care provider.  Thank you for allowing [name] to be a part of your care.

## 2019-06-21 NOTE — ED Triage Notes (Signed)
C/o nausea  Denies emesis or abdominal pain    [facility] has patient on gabapentin 3X per day X1 month.   Patient called them today bc she was sick on her stomach agreed she should see her PCP. PCP recommended patient come to ED.     A/Ox4 Ambulatory in triage.

## 2019-06-21 NOTE — Telephone Encounter (Signed)
Patient left VM that she was "about to throw up from her stomach area" was about to have convulsions and was feeling as if she was going to pass out. She took an unscheduled gabapentin, and was wanting to know if she could take another unscheduled gabapentin since she is supposedly feeling better. Thanks!

## 2019-06-22 NOTE — Telephone Encounter (Signed)
Pt states that she is feeling better. She is picking up Zofran at [facility] now. Pt states that she recently had fluids at [facility]. She believes that not eating salt on rice caused "her to go down really bad".  Informed pt that I did send her MRI order to a [facility] office in [place] and she was fine with that.

## 2019-06-23 DIAGNOSIS — R079 Chest pain, unspecified: Secondary | ICD-10-CM | POA: Diagnosis not present

## 2019-06-23 DIAGNOSIS — R42 Dizziness and giddiness: Secondary | ICD-10-CM | POA: Diagnosis not present

## 2019-06-27 ENCOUNTER — Encounter (HOSPITAL_COMMUNITY): Payer: Self-pay | Admitting: Emergency Medicine

## 2019-06-27 ENCOUNTER — Encounter: Payer: Self-pay | Admitting: Neurology

## 2019-06-27 ENCOUNTER — Other Ambulatory Visit: Payer: Self-pay

## 2019-06-27 ENCOUNTER — Ambulatory Visit (INDEPENDENT_AMBULATORY_CARE_PROVIDER_SITE_OTHER): Payer: Medicare Other | Admitting: Neurology

## 2019-06-27 ENCOUNTER — Emergency Department (HOSPITAL_COMMUNITY)
Admission: EM | Admit: 2019-06-27 | Discharge: 2019-06-27 | Disposition: A | Discharge status: Home / Self Care | Payer: Medicare Other | Attending: Emergency Medicine | Admitting: Emergency Medicine

## 2019-06-27 VITALS — BP 118/86 | HR 84 | Ht 65.0 in | Wt 269.4 lb

## 2019-06-27 DIAGNOSIS — Z79899 Other long term (current) drug therapy: Secondary | ICD-10-CM | POA: Diagnosis not present

## 2019-06-27 DIAGNOSIS — I10 Essential (primary) hypertension: Secondary | ICD-10-CM | POA: Diagnosis not present

## 2019-06-27 DIAGNOSIS — R200 Anesthesia of skin: Secondary | ICD-10-CM | POA: Diagnosis not present

## 2019-06-27 DIAGNOSIS — R531 Weakness: Secondary | ICD-10-CM | POA: Diagnosis not present

## 2019-06-27 DIAGNOSIS — R569 Unspecified convulsions: Secondary | ICD-10-CM | POA: Diagnosis not present

## 2019-06-27 DIAGNOSIS — F419 Anxiety disorder, unspecified: Secondary | ICD-10-CM

## 2019-06-27 DIAGNOSIS — R42 Dizziness and giddiness: Secondary | ICD-10-CM | POA: Diagnosis not present

## 2019-06-27 DIAGNOSIS — R51 Headache: Secondary | ICD-10-CM | POA: Insufficient documentation

## 2019-06-27 DIAGNOSIS — E119 Type 2 diabetes mellitus without complications: Secondary | ICD-10-CM | POA: Diagnosis not present

## 2019-06-27 DIAGNOSIS — R202 Paresthesia of skin: Secondary | ICD-10-CM | POA: Diagnosis not present

## 2019-06-27 DIAGNOSIS — R9431 Abnormal electrocardiogram [ECG] [EKG]: Secondary | ICD-10-CM | POA: Diagnosis not present

## 2019-06-27 DIAGNOSIS — R110 Nausea: Secondary | ICD-10-CM | POA: Insufficient documentation

## 2019-06-27 LAB — RAPID URINE DRUG SCREEN, HOSP PERFORMED
Amphetamines: NOT DETECTED
Barbiturates: NOT DETECTED
Benzodiazepines: NOT DETECTED
Cocaine: NOT DETECTED
Opiates: NOT DETECTED
Tetrahydrocannabinol: NOT DETECTED

## 2019-06-27 LAB — COMPREHENSIVE METABOLIC PANEL
ALT: 21 U/L (ref 0–44)
AST: 18 U/L (ref 15–41)
Albumin: 3.9 g/dL (ref 3.5–5.0)
Alkaline Phosphatase: 125 U/L (ref 38–126)
Anion gap: 10 (ref 5–15)
BUN: 12 mg/dL (ref 6–20)
CO2: 21 mmol/L — ABNORMAL LOW (ref 22–32)
Calcium: 9.5 mg/dL (ref 8.9–10.3)
Chloride: 107 mmol/L (ref 98–111)
Creatinine, Ser: 0.94 mg/dL (ref 0.44–1.00)
GFR calc Af Amer: >60 mL/min (ref >60)
GFR calc non Af Amer: >60 mL/min (ref >60)
Glucose, Bld: 100 mg/dL — ABNORMAL HIGH (ref 70–99)
Potassium: 3.6 mmol/L (ref 3.5–5.1)
Sodium: 138 mmol/L (ref 135–145)
Total Bilirubin: 0.4 mg/dL (ref 0.3–1.2)
Total Protein: 7.3 g/dL (ref 6.5–8.1)

## 2019-06-27 LAB — CBC WITH DIFFERENTIAL/PLATELET
Abs Immature Granulocytes: 0.01 10*3/uL (ref 0.00–0.07)
Basophils Absolute: 0 10*3/uL (ref 0.0–0.1)
Basophils Relative: 1 %
Eosinophils Absolute: 0.2 10*3/uL (ref 0.0–0.5)
Eosinophils Relative: 3 %
HCT: 41.1 % (ref 36.0–46.0)
Hemoglobin: 12.6 g/dL (ref 12.0–15.0)
Immature Granulocytes: 0 %
Lymphocytes Relative: 34 %
Lymphs Abs: 2.2 10*3/uL (ref 0.7–4.0)
MCH: 25.7 pg — ABNORMAL LOW (ref 26.0–34.0)
MCHC: 30.7 g/dL (ref 30.0–36.0)
MCV: 83.7 fL (ref 80.0–100.0)
Monocytes Absolute: 0.3 10*3/uL (ref 0.1–1.0)
Monocytes Relative: 5 %
Neutro Abs: 3.7 10*3/uL (ref 1.7–7.7)
Neutrophils Relative %: 57 %
Platelets: 330 10*3/uL (ref 150–400)
RBC: 4.91 MIL/uL (ref 3.87–5.11)
RDW: 13.3 % (ref 11.5–15.5)
WBC: 6.4 10*3/uL (ref 4.0–10.5)
nRBC: 0 % (ref 0.0–0.2)

## 2019-06-27 LAB — ETHANOL: Alcohol, Ethyl (B): <10 mg/dL (ref <10)

## 2019-06-27 MED ORDER — MECLIZINE HCL 25 MG PO TABS
12.5000 mg | ORAL_TABLET | Freq: Once | ORAL | Status: AC
  Administered 2019-06-27: 23:00:00 12.5 mg via ORAL
  Filled 2019-06-27: qty 1

## 2019-06-27 MED ORDER — MECLIZINE HCL 12.5 MG PO TABS: 12.5000 mg | ORAL_TABLET | Freq: Three times a day (TID) | ORAL | 0 refills | Status: DC | PRN

## 2019-06-27 NOTE — ED Notes (Signed)
Patient verbalizes understanding of discharge instructions. Opportunity for questioning and answers were provided. Armband removed by staff, pt discharged from ED in wheelchair.  

## 2019-06-27 NOTE — Discharge Instructions (Signed)
Follow up with neurology as scheduled for testing
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
## 2019-06-27 NOTE — ED Provider Notes (Signed)
[facility] EMERGENCY DEPARTMENT Provider Note   CSN: [id number] Arrival date & time: 06/27/19  1959     History   Chief Complaint Chief Complaint  Patient presents with  . Dizziness    HPI [name] is a 53 y.o. female.     The history is provided by the patient. No language interpreter was used.  Dizziness Quality:  Lightheadedness Severity:  Moderate Timing:  Intermittent Relieved by:  Nothing Worsened by:  Nothing Ineffective treatments:  None tried Associated symptoms: headaches and nausea   Pt reports she saw the neurologist today for evaluation.  Pt reports she has seizures.  Pt states she has a mass in her head which is inactive. Pt reports she is worried that she will pass out.  Pt states if she leans to the right she passes out.  Pt has a history of schizophrenia.  She has had multiple ED visits and admissions to Behavioral health.   Past Medical History:  Diagnosis Date  . Anxiety   . Delusional disorder ([place])   . Diabetes mellitus without complication ([place])   . Homelessness   . Hypertension   . Major depressive disorder, recurrent, severe with psychotic features ([place])   . Schizophrenia ([place])   . Tremors of nervous system     Patient Active Problem List   Diagnosis Date Noted  . Major depressive disorder, recurrent episode, severe, with psychosis ([place])   . Schizophrenia ([place]) 03/26/2018  . Delusional disorder ([place]) 01/07/2018  . Anxiety 03/29/2016  . Homelessness 11/18/2015  . Tobacco use disorder 07/05/2015  . Hypokalemia 05/30/2015  . Obstipation 05/30/2015  . Prolonged QT interval 05/30/2015  . Hypertension 05/30/2015  . Suicidal ideations 03/12/2015  . Diabetes ([place]) 09/06/2012  . Paranoia ([place]) 04/16/2012    History reviewed. No pertinent surgical history.   OB History   No obstetric history on file.      Home Medications    Prior to Admission medications   Medication Sig Start Date End Date Taking?  Authorizing Provider  acetaminophen (TYLENOL) 500 MG tablet Take 500 mg by mouth every 6 (six) hours as needed for headache.    [provider]  gabapentin (NEURONTIN) 100 MG capsule Take 1 capsule (100 mg total) by mouth 3 (three) times daily. 06/07/19   Johnn Hai, MD  ondansetron (ZOFRAN ODT) 4 MG disintegrating tablet Take 1 tablet (4 mg total) by mouth every 8 (eight) hours as needed for nausea or vomiting. 06/21/19   Maudie Flakes, MD    Family History Family History  Problem Relation Age of Onset  . Hypertension Mother   . Diabetes Mother   . Aneurysm Mother   . Hypertension Father   . Diabetes Father   . Hypertension Sister   . Diabetes Sister   . Hypertension Other   . Diabetes Other     Social History Social History   Tobacco Use  . Smoking status: Never Smoker  . Smokeless tobacco: Never Used  Substance Use Topics  . Alcohol use: No  . Drug use: No     Allergies   Patient has no known allergies.   Review of Systems Review of Systems  Gastrointestinal: Positive for nausea.  Neurological: Positive for dizziness and headaches.  All other systems reviewed and are negative.    Physical Exam Updated Vital Signs BP 107/86 (BP Location: Left Arm)   Pulse 68   Temp 98.3 F (36.8 C) (Oral)   Resp 12   Ht  5\' 5"  (1.651 m)   Wt 122.2 kg   LMP 10/29/2011 Comment: NEG U PREG 02/24/17  SpO2 99%   BMI 44.83 kg/m   Physical Exam Vitals signs and nursing note reviewed.  Constitutional:      Appearance: She is well-developed.  HENT:     Head: Normocephalic.     Nose: Nose normal.     Mouth/Throat:     Mouth: Mucous membranes are moist.  Eyes:     Extraocular Movements: Extraocular movements intact.     Pupils: Pupils are equal, round, and reactive to light.  Neck:     Musculoskeletal: Normal range of motion.  Cardiovascular:     Rate and Rhythm: Normal rate.  Pulmonary:     Effort: Pulmonary effort is normal.  Abdominal:     General: There  is no distension.  Musculoskeletal: Normal range of motion.  Skin:    General: Skin is warm.  Neurological:     General: No focal deficit present.     Mental Status: She is alert and oriented to person, place, and time.      ED Treatments / Results  Labs (all labs ordered are listed, but only abnormal results are displayed) Labs Reviewed  CBC WITH DIFFERENTIAL/PLATELET  COMPREHENSIVE METABOLIC PANEL  ETHANOL  RAPID URINE DRUG SCREEN, HOSP PERFORMED    EKG None  Radiology No results found.  Procedures Procedures (including critical care time)  Medications Ordered in ED Medications - No data to display   Initial Impression / Assessment and Plan / ED Course  I have reviewed the triage vital signs and the nursing notes.  Pertinent labs & imaging results that were available during my care of the patient were reviewed by me and considered in my medical decision making (see chart for details).        MDM  Pt's old records reviewed,  neurology note from today reviewed. Pt has had current symptoms for an extended period of time. No signs of new neurologic event Blood pressure stable.  Pt  Seems stable to go home.  Pt does not feel she needs any psychiatric services.  No thoughts of self harm.  Pt may enefit from antivert.  I will try low dosage   Final Clinical Impressions(s) / ED Diagnoses   Final diagnoses:  Dizziness    ED Discharge Orders         Ordered    meclizine (ANTIVERT) 12.5 MG tablet  3 times daily PRN     06/27/19 2247           Sidney Ace 06/27/19 2247    Sherwood Gambler, MD 06/28/19 1950

## 2019-06-27 NOTE — Patient Instructions (Addendum)
1. Proceed with MRI brain in [place]  2. Schedule EEG  3. Continue gabapentin 100mg  three times a day. Do not take more than prescribed  4. Follow-up with Psychiatry to help with stress management  5. As per [place] driving laws, if you pass out/lose consciousness/have whole body convulsions, do not drive  6. Follow-up in 6 months, call for any changes

## 2019-06-27 NOTE — ED Triage Notes (Signed)
BIB [facility] from home with c/o of dizziness and right sided weakness. Pt seen at [facility] earlier today. Per pt symptoms worsened at home. Pt states she feels as if she is having internal seizures. Also reports right sided head ache.

## 2019-06-28 ENCOUNTER — Telehealth: Payer: Self-pay | Admitting: Neurology

## 2019-06-28 DIAGNOSIS — 419620001 Death: Secondary | SNOMED CT | POA: Diagnosis not present

## 2019-06-28 NOTE — Telephone Encounter (Signed)
Patient left msg with after hours about wanting new rx for zofran. She feels nauseated and dizzy. Was seen yesterday at clinic. Was advised to go ER- patient later called back and said she was going to have a snack and then go to ER as instructed. She will be taking her gabapentin with her. Thanks!

## 2019-06-28 NOTE — Telephone Encounter (Signed)
Pt had a full bottle of Zofran with her yesterday. This medication was prescribed by the ER.

## 2019-06-28 DEATH — deceased

## 2019-07-11 ENCOUNTER — Other Ambulatory Visit: Payer: Medicare Other

## 2019-07-11 DIAGNOSIS — Z5320 Procedure and treatment not carried out because of patient's decision for unspecified reasons: Secondary | ICD-10-CM | POA: Diagnosis not present

## 2019-07-11 DIAGNOSIS — R739 Hyperglycemia, unspecified: Secondary | ICD-10-CM | POA: Diagnosis not present

## 2019-07-11 DIAGNOSIS — R462 Strange and inexplicable behavior: Secondary | ICD-10-CM | POA: Diagnosis not present

## 2019-07-11 DIAGNOSIS — G44209 Tension-type headache, unspecified, not intractable: Secondary | ICD-10-CM | POA: Diagnosis not present

## 2019-07-11 DIAGNOSIS — Z590 Homelessness: Secondary | ICD-10-CM | POA: Diagnosis not present

## 2019-07-11 DIAGNOSIS — Z87898 Personal history of other specified conditions: Secondary | ICD-10-CM | POA: Diagnosis not present

## 2019-07-12 ENCOUNTER — Encounter (HOSPITAL_BASED_OUTPATIENT_CLINIC_OR_DEPARTMENT_OTHER): Payer: Self-pay | Admitting: Emergency Medicine

## 2019-07-12 ENCOUNTER — Emergency Department (HOSPITAL_BASED_OUTPATIENT_CLINIC_OR_DEPARTMENT_OTHER)
Admission: EM | Admit: 2019-07-12 | Discharge: 2019-07-12 | Disposition: A | Discharge status: Home / Self Care | Payer: Medicare Other | Attending: Emergency Medicine | Admitting: Emergency Medicine

## 2019-07-12 ENCOUNTER — Other Ambulatory Visit: Payer: Self-pay

## 2019-07-12 DIAGNOSIS — E119 Type 2 diabetes mellitus without complications: Secondary | ICD-10-CM | POA: Insufficient documentation

## 2019-07-12 DIAGNOSIS — G44209 Tension-type headache, unspecified, not intractable: Secondary | ICD-10-CM | POA: Diagnosis not present

## 2019-07-12 DIAGNOSIS — I10 Essential (primary) hypertension: Secondary | ICD-10-CM | POA: Insufficient documentation

## 2019-07-12 DIAGNOSIS — Z79899 Other long term (current) drug therapy: Secondary | ICD-10-CM | POA: Insufficient documentation

## 2019-07-12 HISTORY — DX: Poisoning by unspecified drugs, medicaments and biological substances, accidental (unintentional), initial encounter: T50.901A

## 2019-07-12 MED ORDER — ONDANSETRON 4 MG PO TBDP
4.0000 mg | ORAL_TABLET | Freq: Once | ORAL | Status: AC
  Administered 2019-07-12: 03:00:00 4 mg via ORAL
  Filled 2019-07-12: qty 1

## 2019-07-12 MED ORDER — ACETAMINOPHEN 500 MG PO TABS
1000.0000 mg | ORAL_TABLET | Freq: Once | ORAL | Status: AC
  Administered 2019-07-12: 1000 mg via ORAL
  Filled 2019-07-12: qty 2

## 2019-07-12 MED ORDER — IBUPROFEN 800 MG PO TABS
800.0000 mg | ORAL_TABLET | Freq: Once | ORAL | Status: AC
  Administered 2019-07-12: 800 mg via ORAL
  Filled 2019-07-12: qty 1

## 2019-07-12 NOTE — ED Triage Notes (Signed)
Stress related headache and states she may be having a convulsion. Sx onset several months ago.

## 2019-07-12 NOTE — ED Provider Notes (Signed)
[facility] EMERGENCY DEPARTMENT Provider Note   CSN: [id number] Arrival date & time: 07/12/19  0023     History   Chief Complaint Chief Complaint  Patient presents with  . Headache    HPI [name] is a 53 y.o. female.     The history is provided by the patient.  Headache Pain location:  Frontal Quality:  Dull Radiates to:  Does not radiate Severity currently:  3/10 Severity at highest:  5/10 Onset quality:  Gradual Timing:  Constant Progression:  Improving Chronicity:  Recurrent Similar to prior headaches: yes   Context: emotional stress   Context: not activity   Relieved by:  Nothing Worsened by:  Nothing Ineffective treatments:  None tried Associated symptoms: no abdominal pain, no back pain, no blurred vision, no congestion, no cough, no diarrhea, no dizziness, no drainage, no ear pain, no eye pain, no facial pain, no fatigue, no fever, no focal weakness, no hearing loss, no loss of balance, no myalgias, no near-syncope, no neck pain, no neck stiffness, no numbness, no paresthesias, no photophobia, no seizures, no sinus pressure, no sore throat, no swollen glands, no syncope, no tingling, no URI, no visual change, no vomiting and no weakness   Risk factors: no family hx of SAH     Past Medical History:  Diagnosis Date  . Anxiety   . Delusional disorder ([place])   . Diabetes mellitus without complication ([place])   . Homelessness   . Hypertension   . Major depressive disorder, recurrent, severe with psychotic features ([place])   . Overdose   . Schizophrenia ([place])   . Tremors of nervous system     Patient Active Problem List   Diagnosis Date Noted  . Major depressive disorder, recurrent episode, severe, with psychosis ([place])   . Schizophrenia ([place]) 03/26/2018  . Delusional disorder ([place]) 01/07/2018  . Anxiety 03/29/2016  . Homelessness 11/18/2015  . Tobacco use disorder 07/05/2015  . Hypokalemia 05/30/2015  . Obstipation 05/30/2015  . Prolonged  QT interval 05/30/2015  . Hypertension 05/30/2015  . Suicidal ideations 03/12/2015  . Diabetes ([place]) 09/06/2012  . Paranoia ([place]) 04/16/2012    History reviewed. No pertinent surgical history.   OB History   No obstetric history on file.      Home Medications    Prior to Admission medications   Medication Sig Start Date End Date Taking? Authorizing Provider  gabapentin (NEURONTIN) 100 MG capsule Take by mouth. 06/07/19  Yes [provider]  acetaminophen (TYLENOL) 500 MG tablet Take 500 mg by mouth every 6 (six) hours as needed for headache.    [provider]  gabapentin (NEURONTIN) 100 MG capsule Take 1 capsule (100 mg total) by mouth 3 (three) times daily. 06/07/19   Johnn Hai, MD  meclizine (ANTIVERT) 12.5 MG tablet Take 1 tablet (12.5 mg total) by mouth 3 (three) times daily as needed for dizziness. 06/27/19   Fransico Meadow, PA-C  ondansetron (ZOFRAN ODT) 4 MG disintegrating tablet Take 1 tablet (4 mg total) by mouth every 8 (eight) hours as needed for nausea or vomiting. 06/21/19   Maudie Flakes, MD  OXcarbazepine (TRILEPTAL) 300 MG/5ML suspension Take by mouth.    [provider]  topiramate (TOPAMAX) 100 MG tablet Take by mouth.    [provider]    Family History Family History  Problem Relation Age of Onset  . Hypertension Mother   . Diabetes Mother   . Aneurysm Mother   . Hypertension Father   .  Diabetes Father   . Hypertension Sister   . Diabetes Sister   . Hypertension Other   . Diabetes Other     Social History Social History   Tobacco Use  . Smoking status: Never Smoker  . Smokeless tobacco: Never Used  Substance Use Topics  . Alcohol use: No  . Drug use: No     Allergies   Patient has no known allergies.   Review of Systems Review of Systems  Constitutional: Negative for fatigue and fever.  HENT: Negative for congestion, ear pain, hearing loss, postnasal drip, sinus pressure and sore throat.   Eyes:  Negative for blurred vision, photophobia and pain.  Respiratory: Negative for cough.   Cardiovascular: Negative for syncope and near-syncope.  Gastrointestinal: Negative for abdominal pain, diarrhea and vomiting.  Genitourinary: Negative for dysuria.  Musculoskeletal: Negative for back pain, myalgias, neck pain and neck stiffness.  Skin: Negative for rash.  Neurological: Positive for headaches. Negative for dizziness, focal weakness, seizures, weakness, numbness, paresthesias and loss of balance.  Psychiatric/Behavioral: Negative for agitation.  All other systems reviewed and are negative.    Physical Exam Updated Vital Signs BP 105/71 (BP Location: Right Arm)   Pulse (!) 57   Temp 98.2 F (36.8 C) (Oral)   Resp 18   LMP 10/29/2011 Comment: NEG U PREG 02/24/17  SpO2 99%   Physical Exam Vitals signs and nursing note reviewed.  Constitutional:      General: She is not in acute distress.    Appearance: She is normal weight.  HENT:     Head: Normocephalic and atraumatic.     Nose: Nose normal.     Mouth/Throat:     Mouth: Mucous membranes are moist.     Pharynx: Oropharynx is clear.  Eyes:     Extraocular Movements: Extraocular movements intact.     Conjunctiva/sclera: Conjunctivae normal.     Pupils: Pupils are equal, round, and reactive to light.  Neck:     Musculoskeletal: Normal range of motion and neck supple.  Cardiovascular:     Rate and Rhythm: Normal rate and regular rhythm.     Pulses: Normal pulses.     Heart sounds: Normal heart sounds.  Pulmonary:     Effort: Pulmonary effort is normal.     Breath sounds: Normal breath sounds.  Abdominal:     General: Abdomen is flat. Bowel sounds are normal.     Tenderness: There is no abdominal tenderness. There is no guarding or rebound.  Musculoskeletal: Normal range of motion.  Skin:    General: Skin is warm and dry.     Capillary Refill: Capillary refill takes less than 2 seconds.  Neurological:     General: No  focal deficit present.     Mental Status: She is alert and oriented to person, place, and time.     Cranial Nerves: No cranial nerve deficit.     Deep Tendon Reflexes: Reflexes normal.  Psychiatric:        Mood and Affect: Mood normal.        Behavior: Behavior normal.      ED Treatments / Results  Labs (all labs ordered are listed, but only abnormal results are displayed) Labs Reviewed - No data to display  EKG None  Radiology No results found.  Procedures Procedures (including critical care time)  Medications Ordered in ED Medications  acetaminophen (TYLENOL) tablet 1,000 mg (1,000 mg Oral Given 07/12/19 0317)  ibuprofen (ADVIL) tablet 800 mg (800 mg Oral  Given 07/12/19 0317)  ondansetron (ZOFRAN-ODT) disintegrating tablet 4 mg (4 mg Oral Given 07/12/19 0317)     Well appearing no signs of ICH, or infection.  Cranial nerves are intact.  Patient is well appearing and sitting comfortably in the room with all the lights on.    Denies SI or HI.  No indication for psychiatric consultation.    Jamai Fest was evaluated in Emergency Department on 07/12/2019 for the symptoms described in the history of present illness. She was evaluated in the context of the global COVID-19 pandemic, which necessitated consideration that the patient might be at risk for infection with the SARS-CoV-2 virus that causes COVID-19. Institutional protocols and algorithms that pertain to the evaluation of patients at risk for COVID-19 are in a state of rapid change based on information released by regulatory bodies including the CDC and federal and state organizations. These policies and algorithms were followed during the patient's care in the ED.   Final Clinical Impressions(s) / ED Diagnoses   Final diagnoses:  Tension-type headache, not intractable, unspecified chronicity pattern    Return for intractable cough, coughing up blood,fevers >100.4 unrelieved by medication, shortness of breath,  intractable vomiting, chest pain, shortness of breath, weakness,numbness, changes in speech, facial asymmetry,abdominal pain, passing out,Inability to tolerate liquids or food, cough, altered mental status or any concerns. No signs of systemic illness or infection. The patient is nontoxic-appearing on exam and vital signs are within normal limits.   I have reviewed the triage vital signs and the nursing notes. Pertinent labs &imaging results that were available during my care of the patient were reviewed by me and considered in my medical decision making (see chart for details).After history, exam, and medical workup I feel the patient has beenappropriately medically screened and is safe for discharge home. Pertinent diagnoses were discussed with the patient. Patient was given return precautions.     Sabirin Baray, MD 07/12/19 [phone]

## 2019-07-13 ENCOUNTER — Encounter (HOSPITAL_BASED_OUTPATIENT_CLINIC_OR_DEPARTMENT_OTHER): Payer: Self-pay | Admitting: Emergency Medicine

## 2019-07-13 ENCOUNTER — Inpatient Hospital Stay (HOSPITAL_COMMUNITY)
Admission: AD | Admit: 2019-07-13 | Discharge: 2019-07-20 | DRG: 885 | Disposition: A | Discharge status: Home / Self Care | Payer: Medicare Other | Source: Intra-hospital | Attending: Psychiatry | Admitting: Psychiatry

## 2019-07-13 ENCOUNTER — Other Ambulatory Visit: Payer: Self-pay

## 2019-07-13 ENCOUNTER — Emergency Department (HOSPITAL_BASED_OUTPATIENT_CLINIC_OR_DEPARTMENT_OTHER)
Admission: EM | Admit: 2019-07-13 | Discharge: 2019-07-13 | Disposition: A | Discharge status: Other Acute Inpatient Hospital | Payer: Medicare Other | Source: Home / Self Care | Attending: Emergency Medicine | Admitting: Emergency Medicine

## 2019-07-13 ENCOUNTER — Emergency Department (HOSPITAL_BASED_OUTPATIENT_CLINIC_OR_DEPARTMENT_OTHER)
Admission: EM | Admit: 2019-07-13 | Discharge: 2019-07-13 | Disposition: A | Discharge status: Home / Self Care | Payer: Medicare Other | Attending: Emergency Medicine | Admitting: Emergency Medicine

## 2019-07-13 ENCOUNTER — Encounter (HOSPITAL_COMMUNITY): Payer: Self-pay

## 2019-07-13 DIAGNOSIS — F209 Schizophrenia, unspecified: Secondary | ICD-10-CM | POA: Insufficient documentation

## 2019-07-13 DIAGNOSIS — F329 Major depressive disorder, single episode, unspecified: Secondary | ICD-10-CM | POA: Diagnosis present

## 2019-07-13 DIAGNOSIS — Z046 Encounter for general psychiatric examination, requested by authority: Secondary | ICD-10-CM | POA: Insufficient documentation

## 2019-07-13 DIAGNOSIS — F22 Delusional disorders: Secondary | ICD-10-CM

## 2019-07-13 DIAGNOSIS — E119 Type 2 diabetes mellitus without complications: Secondary | ICD-10-CM | POA: Insufficient documentation

## 2019-07-13 DIAGNOSIS — F200 Paranoid schizophrenia: Secondary | ICD-10-CM | POA: Diagnosis not present

## 2019-07-13 DIAGNOSIS — I10 Essential (primary) hypertension: Secondary | ICD-10-CM | POA: Diagnosis not present

## 2019-07-13 DIAGNOSIS — Z03818 Encounter for observation for suspected exposure to other biological agents ruled out: Secondary | ICD-10-CM | POA: Diagnosis not present

## 2019-07-13 DIAGNOSIS — R45851 Suicidal ideations: Secondary | ICD-10-CM | POA: Insufficient documentation

## 2019-07-13 DIAGNOSIS — Z79899 Other long term (current) drug therapy: Secondary | ICD-10-CM | POA: Insufficient documentation

## 2019-07-13 DIAGNOSIS — Z20828 Contact with and (suspected) exposure to other viral communicable diseases: Secondary | ICD-10-CM | POA: Insufficient documentation

## 2019-07-13 DIAGNOSIS — G4700 Insomnia, unspecified: Secondary | ICD-10-CM | POA: Diagnosis present

## 2019-07-13 DIAGNOSIS — Z9114 Patient's other noncompliance with medication regimen: Secondary | ICD-10-CM

## 2019-07-13 DIAGNOSIS — Z87891 Personal history of nicotine dependence: Secondary | ICD-10-CM | POA: Diagnosis not present

## 2019-07-13 DIAGNOSIS — F419 Anxiety disorder, unspecified: Secondary | ICD-10-CM | POA: Insufficient documentation

## 2019-07-13 DIAGNOSIS — Z9119 Patient's noncompliance with other medical treatment and regimen: Secondary | ICD-10-CM

## 2019-07-13 DIAGNOSIS — F339 Major depressive disorder, recurrent, unspecified: Secondary | ICD-10-CM | POA: Insufficient documentation

## 2019-07-13 HISTORY — DX: Paranoid schizophrenia: F20.0

## 2019-07-13 LAB — COMPREHENSIVE METABOLIC PANEL
ALT: 21 U/L (ref 0–44)
AST: 21 U/L (ref 15–41)
Albumin: 4.5 g/dL (ref 3.5–5.0)
Alkaline Phosphatase: 117 U/L (ref 38–126)
Anion gap: 10 (ref 5–15)
BUN: 21 mg/dL — ABNORMAL HIGH (ref 6–20)
CO2: 23 mmol/L (ref 22–32)
Calcium: 9.9 mg/dL (ref 8.9–10.3)
Chloride: 107 mmol/L (ref 98–111)
Creatinine, Ser: 1.02 mg/dL — ABNORMAL HIGH (ref 0.44–1.00)
GFR calc Af Amer: >60 mL/min (ref >60)
GFR calc non Af Amer: >60 mL/min (ref >60)
Glucose, Bld: 120 mg/dL — ABNORMAL HIGH (ref 70–99)
Potassium: 4.2 mmol/L (ref 3.5–5.1)
Sodium: 140 mmol/L (ref 135–145)
Total Bilirubin: 0.2 mg/dL — ABNORMAL LOW (ref 0.3–1.2)
Total Protein: 7.9 g/dL (ref 6.5–8.1)

## 2019-07-13 LAB — URINALYSIS, ROUTINE W REFLEX MICROSCOPIC
Bilirubin Urine: NEGATIVE
Glucose, UA: NEGATIVE mg/dL
Hgb urine dipstick: NEGATIVE
Ketones, ur: NEGATIVE mg/dL
Leukocytes,Ua: NEGATIVE
Nitrite: NEGATIVE
Protein, ur: NEGATIVE mg/dL
Specific Gravity, Urine: >1.03 — ABNORMAL HIGH (ref 1.005–1.030)
pH: 5.5 (ref 5.0–8.0)

## 2019-07-13 LAB — CBC
HCT: 42 % (ref 36.0–46.0)
Hemoglobin: 12.7 g/dL (ref 12.0–15.0)
MCH: 25.1 pg — ABNORMAL LOW (ref 26.0–34.0)
MCHC: 30.2 g/dL (ref 30.0–36.0)
MCV: 83 fL (ref 80.0–100.0)
Platelets: 317 10*3/uL (ref 150–400)
RBC: 5.06 MIL/uL (ref 3.87–5.11)
RDW: 13.7 % (ref 11.5–15.5)
WBC: 5.4 10*3/uL (ref 4.0–10.5)
nRBC: 0 % (ref 0.0–0.2)

## 2019-07-13 LAB — RAPID URINE DRUG SCREEN, HOSP PERFORMED
Amphetamines: NOT DETECTED
Barbiturates: NOT DETECTED
Benzodiazepines: NOT DETECTED
Cocaine: NOT DETECTED
Opiates: NOT DETECTED
Tetrahydrocannabinol: NOT DETECTED

## 2019-07-13 LAB — SARS CORONAVIRUS 2 BY RT PCR (HOSPITAL ORDER, PERFORMED IN ~~LOC~~ HOSPITAL LAB): SARS Coronavirus 2: NEGATIVE

## 2019-07-13 LAB — ETHANOL: Alcohol, Ethyl (B): <10 mg/dL (ref <10)

## 2019-07-13 LAB — SALICYLATE LEVEL: Salicylate Lvl: <7 mg/dL (ref 2.8–30.0)

## 2019-07-13 LAB — ACETAMINOPHEN LEVEL: Acetaminophen (Tylenol), Serum: <10 ug/mL — ABNORMAL LOW (ref 10–30)

## 2019-07-13 LAB — PREGNANCY, URINE: Preg Test, Ur: NEGATIVE

## 2019-07-13 MED ORDER — GABAPENTIN 100 MG PO CAPS
100.0000 mg | ORAL_CAPSULE | Freq: Three times a day (TID) | ORAL | Status: DC
  Administered 2019-07-13: 100 mg via ORAL
  Filled 2019-07-13: qty 1

## 2019-07-13 MED ORDER — HALOPERIDOL LACTATE 5 MG/ML IJ SOLN: 2.0000 mg | Freq: Once | INTRAMUSCULAR | Status: DC

## 2019-07-13 MED ORDER — BENZTROPINE MESYLATE 1 MG/ML IJ SOLN: 0.5000 mg | Freq: Two times a day (BID) | INTRAMUSCULAR | Status: DC

## 2019-07-13 MED ORDER — FLUPHENAZINE HCL 5 MG PO TABS
15.0000 mg | ORAL_TABLET | Freq: Every day | ORAL | Status: DC
  Filled 2019-07-13: qty 1

## 2019-07-13 MED ORDER — MAGNESIUM HYDROXIDE 400 MG/5ML PO SUSP: 30.0000 mL | Freq: Every day | ORAL | Status: DC | PRN

## 2019-07-13 MED ORDER — TRAZODONE HCL 100 MG PO TABS
100.0000 mg | ORAL_TABLET | Freq: Every evening | ORAL | Status: DC | PRN
  Filled 2019-07-13: qty 1

## 2019-07-13 MED ORDER — ACETAMINOPHEN 325 MG PO TABS: 650.0000 mg | ORAL_TABLET | Freq: Four times a day (QID) | ORAL | Status: DC | PRN

## 2019-07-13 MED ORDER — ALUM & MAG HYDROXIDE-SIMETH 200-200-20 MG/5ML PO SUSP: 30.0000 mL | ORAL | Status: DC | PRN

## 2019-07-13 MED ORDER — HYDROXYZINE HCL 50 MG PO TABS
50.0000 mg | ORAL_TABLET | Freq: Three times a day (TID) | ORAL | Status: DC | PRN
  Filled 2019-07-13: qty 1

## 2019-07-13 MED ORDER — GABAPENTIN 100 MG PO CAPS
100.0000 mg | ORAL_CAPSULE | Freq: Once | ORAL | Status: AC
  Administered 2019-07-14: 100 mg via ORAL
  Filled 2019-07-13 (×2): qty 1

## 2019-07-13 NOTE — ED Triage Notes (Signed)
Pt feels like she is going to hurt herself since this am.  Pt feels fearful, anxious.  No plan.  Pt requested to be admitted.

## 2019-07-13 NOTE — ED Notes (Signed)
Per conversation with Dr. Randal Buba, MD, clinician made contact with pt to provide pt with information for otpt services. Clinician contacted pt via telephone, as Tele-Assessment machine was not working. Pt verified pt was not experiencing SI, had no hx of SI, had never attempted to kill herself, had no plan to kill herself, and had never been hospitalized for mental health reasons. Pt denied HI, AVH, NSSIB, had no access to guns/weapons, has no engagement in the legal system, and does not use substances.  Pt is oriented x4. Her recent and remote memory is intact. Pt was cooperative throughout the assessment process. Pt's insight, judgement, and impulse control is fair at this time.  Clinician faxed outpatient service info for [place] and [place] to pt at [facility].

## 2019-07-13 NOTE — ED Notes (Signed)
Security at bedside wanding pt and belongings.

## 2019-07-13 NOTE — [facility] Counselor (Signed)
Per conversation with Dr. Randal Buba, MD, clinician made contact with pt to provide pt with information for otpt services. Clinician contacted pt via telephone, as Tele-Assessment machine was not working. Pt verified pt was not experiencing SI, had no hx of SI, had never attempted to kill herself, had no plan to kill herself, and had never been hospitalized for mental health reasons. Pt denied HI, AVH, NSSIB, had no access to guns/weapons, has no engagement in the legal system, and does not use substances.  Pt is oriented x4. Her recent and remote memory is intact. Pt was cooperative throughout the assessment process. Pt's insight, judgement, and impulse control is fair at this time.  Clinician faxed outpatient service info for [place] and [place] to pt at [facility].

## 2019-07-13 NOTE — [facility] Counselor (Signed)
Pt accepted to [facility] by Tinnie Gens, NP. Pending Covid results. Pt accepted to [phone]. Attending Dr. Jake Samples.  Lorenza Cambridge, [facility] Triage Specialist
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
## 2019-07-13 NOTE — ED Notes (Signed)
Call from [facility], pt accepted for inpatient bed pending negative covid test.  Accepted by Bjorn Pippin NP, Dr Sheppard Evens.  Covid test collected.  Dr Johnney Killian made aware.  Voluntary consent signed by patient and faxed.

## 2019-07-13 NOTE — ED Notes (Signed)
Pt resting quietly in room awaiting dispo

## 2019-07-13 NOTE — ED Notes (Signed)
Security notified of pt need to be wanded per protocol.

## 2019-07-13 NOTE — ED Notes (Addendum)
Call to unit to provide report,  Unable to come to phone at this time, will return call in 10 minutes. [facility] contacted for transport, unit aware transport en route to pick up patient.

## 2019-07-13 NOTE — ED Triage Notes (Signed)
PT states wants to check in to behavioral health for anxiety and feeling fearful. Pt denies wanting to harm themselves

## 2019-07-13 NOTE — ED Notes (Signed)
[facility] transport here for patient; patient refused transport due to driver ethnicity; another driver dispatched out.

## 2019-07-13 NOTE — ED Notes (Signed)
Pt states she feels like the assessor for [facility] .  She relates that they were not nice to her because she wants another person to evaluate her.
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
## 2019-07-13 NOTE — ED Notes (Signed)
Awaiting TTS consult. PT refusing Haldol at this time. Pt cooperative and calm.
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
## 2019-07-13 NOTE — ED Notes (Signed)
[facility] called for transport to [facility].

## 2019-07-14 DIAGNOSIS — F200 Paranoid schizophrenia: Principal | ICD-10-CM

## 2019-07-14 MED ORDER — FLUPHENAZINE HCL 5 MG PO TABS
5.0000 mg | ORAL_TABLET | Freq: Three times a day (TID) | ORAL | Status: DC
  Administered 2019-07-15 – 2019-07-17 (×6): 5 mg via ORAL
  Filled 2019-07-14 (×15): qty 1

## 2019-07-14 MED ORDER — TEMAZEPAM 30 MG PO CAPS
30.0000 mg | ORAL_CAPSULE | Freq: Every day | ORAL | Status: DC
  Administered 2019-07-16 – 2019-07-19 (×4): 30 mg via ORAL
  Filled 2019-07-14 (×6): qty 1

## 2019-07-14 MED ORDER — BENZTROPINE MESYLATE 1 MG PO TABS
1.0000 mg | ORAL_TABLET | Freq: Two times a day (BID) | ORAL | Status: DC
  Administered 2019-07-15 – 2019-07-20 (×11): 1 mg via ORAL
  Filled 2019-07-14 (×17): qty 1
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
## 2019-07-14 NOTE — BHH Group Notes (Cosign Needed)
LCSW Aftercare Discharge Planning Group Note  07/14/2019   Type of Group and Topic: Psychoeducational Group: Discharge Planning  Participation Level: Pt did not attend   Description of Group  Discharge planning group reviews patient's anticipated discharge plans and assists patients to anticipate and address any barriers to wellness/recovery in the community. Suicide prevention education is reviewed with patients in group.  Therapeutic Goals  1. Patients will state their anticipated discharge plan and mental health aftercare  2. Patients will identify potential barriers to wellness in the community setting  3. Patients will engage in problem solving, solution focused discussion of ways to anticipate and address barriers to wellness/recovery  Summary of Patient Progress  Plan for Discharge/Comments:   Transportation Means:   Supports:  Therapeutic Modalities:  Motivational Interviewing   Ovidio Kin, MSW Intern

## 2019-07-14 NOTE — Progress Notes (Signed)
Recreation Therapy Notes  Date:  9.17.20 Time: 1000 Location: 500 Hall Dayroom  Group Topic: Coping Skills  Goal Area(s) Addresses:  Patient will identify positive coping skills. Patient will identify barriers to using positive coping skills.  Intervention:  Worksheet, pencils  Activity: Healthy vs. Unhealthy Coping Strategies.  Patient were to identify unhealthy coping strategies and consequences to those strategies.  Patients would then identify positive coping strategies, outcomes and barriers to using those positive coping strategies.  Education:Coping Skills, Discharge Planning.   Education Outcome: Acknowledges understanding/In group clarification offered/Needs additional education.   Clinical Observations/Feedback:  Pt did not attend group.       [name], LRT/CTRS    [name] A 07/14/2019 12:24 PM

## 2019-07-14 NOTE — Progress Notes (Signed)
CSW attempted to meet with patient at bedside to complete PSA. Patient was sleeping soundly and did not respond to CSW knocking at the door.  CSW will follow up at a later time.  Stephanie Acre, LCSW-A Clinical Social Worker
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
## 2019-07-14 NOTE — [facility] Suicide Risk Assessment (Signed)
[facility] Admission Suicide Risk Assessment   Nursing information obtained from:  Patient Demographic factors:  Unemployed, Living alone, Low socioeconomic status Current Mental Status:  Self-harm behaviors Loss Factors:  Financial problems / change in socioeconomic status Historical Factors:  Impulsivity Risk Reduction Factors:  Religious beliefs about death  Total Time spent with patient: 45 minutes Principal Problem: <principal problem not specified> Diagnosis:  Active Problems:   MDD (major depressive disorder)  Subjective Data: 53 year old patient with a recurrence of elaborate and fixed delusions of being stalked harassed so forth believes an entire churches trying to harm her see the admission note.  May have delusional disorder paranoid type versus schizophrenic disorder, treatment the same  Continued Clinical Symptoms:  Alcohol Use Disorder Identification Test Final Score (AUDIT): 0 The "Alcohol Use Disorders Identification Test", Guidelines for Use in Primary Care, Second Edition.  World Pharmacologist [facility]). Score between 0-7:  no or low risk or alcohol related problems. Score between 8-15:  moderate risk of alcohol related problems. Score between 16-19:  high risk of alcohol related problems. Score 20 or above:  warrants further diagnostic evaluation for alcohol dependence and treatment.   CLINICAL FACTORS:   Schizophrenia:   Paranoid or undifferentiated type   Musculoskeletal: Strength & Muscle Tone: within normal limits Gait & Station: normal Patient leans: N/A  Psychiatric Specialty Exam: Physical Exam  ROS  Blood pressure (!) 136/113, pulse 92, temperature 98.2 F (36.8 C), temperature source Oral, resp. rate 18, height 5\' 5"  (1.651 m), weight 119.7 kg, last menstrual period 10/29/2011, SpO2 100 %.Body mass index is 43.93 kg/m.  General Appearance: Casual  Eye Contact:  None  Speech:  Pressured  Volume:  Decreased  Mood:  Anxious and Dysphoric  Affect:   Congruent  Thought Process:  Linear and Descriptions of Associations: Tangential  Orientation:  Full (Time, Place, and Person)  Thought Content:  Illogical and Delusions  Suicidal Thoughts:  No  Homicidal Thoughts:  No  Memory:  Immediate;   Fair  Judgement:  Poor  Insight:  Shallow  Psychomotor Activity:  Normal  Concentration:  Attention Span: Fair  Recall:  [employer] of Knowledge:  Fair  Language:  Fair  Akathisia:  Negative  Handed:  Right  AIMS (if indicated):     Assets:  Communication Skills Leisure Time Physical Health  ADL's:  Intact  Cognition:  WNL  Sleep:  Number of Hours: 6.75      COGNITIVE FEATURES THAT CONTRIBUTE TO RISK:  Closed-mindedness and Loss of executive function    SUICIDE RISK:   Minimal: No identifiable suicidal ideation.  Patients presenting with no risk factors but with morbid ruminations; may be classified as minimal risk based on the severity of the depressive symptoms  PLAN OF CARE: Admit for stabilization antipsychotic therapy may need long-acting injectable  I certify that inpatient services furnished can reasonably be expected to improve the patient's condition.   Johnn Hai, MD 07/14/2019, 10:12 AM

## 2019-07-15 MED ORDER — CARVEDILOL 12.5 MG PO TABS
12.5000 mg | ORAL_TABLET | Freq: Two times a day (BID) | ORAL | Status: DC
  Administered 2019-07-15 – 2019-07-20 (×9): 12.5 mg via ORAL
  Filled 2019-07-15 (×14): qty 1

## 2019-07-15 MED ORDER — GABAPENTIN 300 MG PO CAPS
300.0000 mg | ORAL_CAPSULE | Freq: Three times a day (TID) | ORAL | Status: DC
  Administered 2019-07-15 – 2019-07-20 (×14): 300 mg via ORAL
  Filled 2019-07-15 (×21): qty 1

## 2019-07-15 NOTE — Progress Notes (Signed)
Adult Psychoeducational Group Note  Date:  07/15/2019 Time:  11:16 PM  Group Topic/Focus:  Wrap-Up Group:   The focus of this group is to help patients review their daily goal of treatment and discuss progress on daily workbooks.  Participation Level:  Did Not Attend  Participation Quality:  Did Not Attend  Affect:  Did Not Attend  Cognitive:  Did Not Attend  Insight: None  Engagement in Group:  Did Not Attend  Modes of Intervention:  Did Not Attend  Additional Comments:  Pt did not attend evening wrap up group tonight.  Candy Sledge 07/15/2019, 11:16 PM

## 2019-07-15 NOTE — Progress Notes (Signed)
[facility] MD Progress Note  07/15/2019 10:53 AM [name]  MRN:  [id number] Subjective:    Pre-team rounds she was guarded minimally talkative but still paranoid but would not elaborate posted team she was little more elaborate still believe she is in danger and conspired against by an entire church community she also is somatic and delusional in this regard "I am having seizures" I need Neurontin is her main complaint so we can certainly order Neurontin and hopefully she will start complying with antipsychotics when she gets this request filled Principal Problem: Delusional disorder ([place]) Diagnosis: Principal Problem:   Delusional disorder ([place]) Active Problems:   Anxiety   Schizophrenia ([place])   MDD (major depressive disorder)  Total Time spent with patient: 30 minutes  Past Psychiatric History: 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 Anxiety   . Delusional disorder ([place])   . Diabetes mellitus without complication ([place])   . Homelessness   . Hypertension   . Major depressive disorder, recurrent, severe with psychotic features ([place])   . Overdose   . Paranoid schizophrenia ([place])   . Schizophrenia ([place])   . Tremors of nervous system    History reviewed. No pertinent surgical history. Family History:  Family History  Problem Relation Age of Onset  . Hypertension Mother   . Diabetes Mother   . Aneurysm Mother   . Hypertension Father   . Diabetes Father   . Hypertension Sister   . Diabetes Sister   . Hypertension Other   . Diabetes Other    Family Psychiatric  History: neg Social History:  Social History   Substance and Sexual Activity  Alcohol Use No     Social History   Substance and Sexual Activity  Drug Use No    Social History   Socioeconomic History  . Marital status: Single    Spouse name: Not on file  . Number of children: Not on file  . Years of education: Not on file  .  Highest education level: Not on file  Occupational History  . Not on file  Social Needs  . Financial resource strain: Not on file  . Food insecurity    Worry: Not on file    Inability: Not on file  . Transportation needs    Medical: Not on file    Non-medical: Not on file  Tobacco Use  . Smoking status: Never Smoker  . Smokeless tobacco: Never Used  Substance and Sexual Activity  . Alcohol use: No  . Drug use: No  . Sexual activity: Never  Lifestyle  . Physical activity    Days per week: Not on file    Minutes per session: Not on file  . Stress: Not on file  Relationships  . Social Herbalist on phone: Not on file    Gets together: Not on file    Attends religious service: Not on file    Active member of club or organization: Not on file    Attends meetings of clubs or organizations: Not on file    Relationship status: Not on file  Other Topics Concern  . Not on file  Social History Narrative   Lives alone in an apartment. Has steps up to apartment      Right handed      Highest level of edu- Some college      Unemployed   Additional Social History:  Sleep: Fair  Appetite:  Fair  Current Medications: Current Facility-Administered Medications  Medication Dose Route Frequency Provider Last Rate Last Dose  . acetaminophen (TYLENOL) tablet 650 mg  650 mg Oral Q6H PRN Dixon, Rashaun M, NP      . alum & mag hydroxide-simeth (MAALOX/MYLANTA) 200-200-20 MG/5ML suspension 30 mL  30 mL Oral Q4H PRN Dixon, Rashaun M, NP      . benztropine (COGENTIN) tablet 1 mg  1 mg Oral BID Johnn Hai, MD      . fluPHENAZine (PROLIXIN) tablet 5 mg  5 mg Oral TID Johnn Hai, MD      . gabapentin (NEURONTIN) capsule 300 mg  300 mg Oral TID Johnn Hai, MD      . hydrOXYzine (ATARAX/VISTARIL) tablet 50 mg  50 mg Oral TID PRN Deloria Lair, NP      . magnesium hydroxide (MILK OF MAGNESIA) suspension 30 mL  30 mL Oral Daily PRN Dixon,  Rashaun M, NP      . temazepam (RESTORIL) capsule 30 mg  30 mg Oral QHS Johnn Hai, MD      . traZODone (DESYREL) tablet 100 mg  100 mg Oral QHS PRN Deloria Lair, NP        Lab Results:  Results for orders placed or performed during the hospital encounter of 07/13/19 (from the past 48 hour(s))  SARS Coronavirus 2 [facility] order, Performed in [facility] hospital lab) Nasopharyngeal Nasopharyngeal Swab     Status: None   Collection Time: 07/13/19  6:07 PM   Specimen: Nasopharyngeal Swab  Result Value Ref Range   SARS Coronavirus 2 NEGATIVE NEGATIVE    Comment: (NOTE) If result is NEGATIVE SARS-CoV-2 target nucleic acids are NOT DETECTED. The SARS-CoV-2 RNA is generally detectable in upper and lower  respiratory specimens during the acute phase of infection. The lowest  concentration of SARS-CoV-2 viral copies this assay can detect is 250  copies / mL. A negative result does not preclude SARS-CoV-2 infection  and should not be used as the sole basis for treatment or other  patient management decisions.  A negative result may occur with  improper specimen collection / handling, submission of specimen other  than nasopharyngeal swab, presence of viral mutation(s) within the  areas targeted by this assay, and inadequate number of viral copies  (<250 copies / mL). A negative result must be combined with clinical  observations, patient history, and epidemiological information. If result is POSITIVE SARS-CoV-2 target nucleic acids are DETECTED. The SARS-CoV-2 RNA is generally detectable in upper and lower  respiratory specimens dur ing the acute phase of infection.  Positive  results are indicative of active infection with SARS-CoV-2.  Clinical  correlation with patient history and other diagnostic information is  necessary to determine patient infection status.  Positive results do  not rule out bacterial infection or co-infection with other viruses. If result is PRESUMPTIVE  POSTIVE SARS-CoV-2 nucleic acids MAY BE PRESENT.   A presumptive positive result was obtained on the submitted specimen  and confirmed on repeat testing.  While 2019 novel coronavirus  (SARS-CoV-2) nucleic acids may be present in the submitted sample  additional confirmatory testing may be necessary for epidemiological  and / or clinical management purposes  to differentiate between  SARS-CoV-2 and other Sarbecovirus currently known to infect humans.  If clinically indicated additional testing with an alternate test  methodology [phone]) is advised. The SARS-CoV-2 RNA is generally  detectable in upper and lower respiratory sp ecimens during  the acute  phase of infection. The expected result is Negative. Fact Sheet for Patients:  StrictlyIdeas.no Fact Sheet for Healthcare Providers: BankingDealers.co.za This test is not yet approved or cleared by the [place] FDA and has been authorized for detection and/or diagnosis of SARS-CoV-2 by FDA under an Emergency Use Authorization (EUA).  This EUA will remain in effect (meaning this test can be used) for the duration of the COVID-19 declaration under Section 564(b)(1) of the Act, 21 U.S.C. section 360bbb-3(b)(1), unless the authorization is terminated or revoked sooner. Performed at [facility], [address] [place], [place] [address]     Blood Alcohol level:  Lab Results  Component Value Date   [facility] <10 07/13/2019   ETH <10 [id number]    Metabolic Disorder Labs: Lab Results  Component Value Date   HGBA1C 6.6 (H) 06/05/2019   MPG 142.72 06/05/2019   MPG 148.46 02/23/2019   No results found for: PROLACTIN Lab Results  Component Value Date   CHOL 220 (H) 06/05/2019   TRIG 210 (H) 06/05/2019   HDL 39 (L) 06/05/2019   CHOLHDL 5.6 06/05/2019   VLDL 42 (H) 06/05/2019   LDLCALC 139 (H) 06/05/2019   LDLCALC 133 (H) 02/23/2019    Physical Findings: AIMS: Facial  and Oral Movements Muscles of Facial Expression: None, normal Lips and Perioral Area: None, normal Jaw: None, normal Tongue: None, normal,Extremity Movements Upper (arms, wrists, hands, fingers): None, normal Lower (legs, knees, ankles, toes): None, normal, Trunk Movements Neck, shoulders, hips: None, normal, Overall Severity Severity of abnormal movements (highest score from questions above): None, normal Incapacitation due to abnormal movements: None, normal Patient's awareness of abnormal movements (rate only patient's report): No Awareness, Dental Status Current problems with teeth and/or dentures?: No Does patient usually wear dentures?: No  CIWA:  CIWA-Ar Total: 0 COWS:  COWS Total Score: 1  Musculoskeletal: Strength & Muscle Tone: within normal limits Gait & Station: normal Patient leans: N/A  Psychiatric Specialty Exam: Physical Exam  ROS  Blood pressure (!) 136/113, pulse 92, temperature 98.2 F (36.8 C), temperature source Oral, resp. rate 18, height 5\' 5"  (1.651 m), weight 119.7 kg, last menstrual period 10/29/2011, SpO2 100 %.Body mass index is 43.93 kg/m.  General Appearance: Disheveled  Eye Contact:  Minimal  Speech:  Clear and Coherent  Volume:  Decreased  Mood:  Dysphoric  Affect:  Congruent  Thought Process:  Goal Directed and Descriptions of Associations: Circumstantial  Orientation:  Full (Time, Place, and Person)  Thought Content:  Illogical and Delusions  Suicidal Thoughts:  No  Homicidal Thoughts:  No  Memory:  Immediate;   Fair Recent;   Fair  Judgement:  Impaired  Insight:  Lacking  Psychomotor Activity:  Normal  Concentration:  Concentration: Fair and Attention Span: Fair  Recall:  [employer] of Knowledge:  Fair  Language:  Fair  Akathisia:  Negative  Handed:  Right  AIMS (if indicated):     Assets:  Resilience Social Support  ADL's:  Intact  Cognition:  WNL  Sleep:  Number of Hours: 4     Treatment Plan Summary: Daily contact with  patient to assess and evaluate symptoms and progress in treatment and Medication management  Continue reality based therapy for delusional disorder continue to monitor for safety under 15-minute precautions continue antipsychotic orders hopefully will comply once we order Neurontin no change in precautions through the weekend Add beta-blocker for hypertension Randa Riss, MD 07/15/2019, 10:53 AM

## 2019-07-15 NOTE — Tx Team (Signed)
Interdisciplinary Treatment and Diagnostic Plan Update  07/15/2019 Time of Session: 9:00am [name] MRN: [id number]  Principal Diagnosis: Delusional disorder [facility])  Secondary Diagnoses: Principal Problem:   Delusional disorder ([place]) Active Problems:   Anxiety   Schizophrenia ([place])   MDD (major depressive disorder)   Current Medications:  Current Facility-Administered Medications  Medication Dose Route Frequency Provider Last Rate Last Dose  . acetaminophen (TYLENOL) tablet 650 mg  650 mg Oral Q6H PRN Dixon, Rashaun M, NP      . alum & mag hydroxide-simeth (MAALOX/MYLANTA) 200-200-20 MG/5ML suspension 30 mL  30 mL Oral Q4H PRN Dixon, Rashaun M, NP      . benztropine (COGENTIN) tablet 1 mg  1 mg Oral BID Johnn Hai, MD      . fluPHENAZine (PROLIXIN) tablet 5 mg  5 mg Oral TID Johnn Hai, MD      . hydrOXYzine (ATARAX/VISTARIL) tablet 50 mg  50 mg Oral TID PRN Deloria Lair, NP      . magnesium hydroxide (MILK OF MAGNESIA) suspension 30 mL  30 mL Oral Daily PRN Dixon, Rashaun M, NP      . temazepam (RESTORIL) capsule 30 mg  30 mg Oral QHS Johnn Hai, MD      . traZODone (DESYREL) tablet 100 mg  100 mg Oral QHS PRN Deloria Lair, NP       PTA Medications: Medications Prior to Admission  Medication Sig Dispense Refill Last Dose  . gabapentin (NEURONTIN) 100 MG capsule Take 1 capsule (100 mg total) by mouth 3 (three) times daily. 90 capsule 1   . meclizine (ANTIVERT) 12.5 MG tablet Take 1 tablet (12.5 mg total) by mouth 3 (three) times daily as needed for dizziness. 15 tablet 0   . acetaminophen (TYLENOL) 500 MG tablet Take 500 mg by mouth every 6 (six) hours as needed for headache.     . ondansetron (ZOFRAN ODT) 4 MG disintegrating tablet Take 1 tablet (4 mg total) by mouth every 8 (eight) hours as needed for nausea or vomiting. (Patient not taking: Reported on 07/14/2019) 20 tablet 0 Completed Course at Unknown time    Patient Stressors: Health problems Occupational  concerns Traumatic event  Patient Strengths: General fund of knowledge Work skills  Treatment Modalities: Medication Management, Group therapy, Case management,  1 to 1 session with clinician, Psychoeducation, Recreational therapy.   Physician Treatment Plan for Primary Diagnosis: Delusional disorder [facility]) Long Term Goal(s): Improvement in symptoms so as ready for discharge Improvement in symptoms so as ready for discharge   Short Term Goals: Ability to identify changes in lifestyle to reduce recurrence of condition will improve Compliance with prescribed medications will improve  Medication Management: Evaluate patient's response, side effects, and tolerance of medication regimen.  Therapeutic Interventions: 1 to 1 sessions, Unit Group sessions and Medication administration.  Evaluation of Outcomes: Not Met  Physician Treatment Plan for Secondary Diagnosis: Principal Problem:   Delusional disorder ([place]) Active Problems:   Anxiety   Schizophrenia ([place])   MDD (major depressive disorder)  Long Term Goal(s): Improvement in symptoms so as ready for discharge Improvement in symptoms so as ready for discharge   Short Term Goals: Ability to identify changes in lifestyle to reduce recurrence of condition will improve Compliance with prescribed medications will improve     Medication Management: Evaluate patient's response, side effects, and tolerance of medication regimen.  Therapeutic Interventions: 1 to 1 sessions, Unit Group sessions and Medication administration.  Evaluation of Outcomes: Not Met  RN Treatment Plan for Primary Diagnosis: Delusional disorder ([place]) Long Term Goal(s): Knowledge of disease and therapeutic regimen to maintain health will improve  Short Term Goals: Ability to identify and develop effective coping behaviors will improve and Compliance with prescribed medications will improve  Medication Management: RN will administer medications as ordered by  provider, will assess and evaluate patient's response and provide education to patient for prescribed medication. RN will report any adverse and/or side effects to prescribing provider.  Therapeutic Interventions: 1 on 1 counseling sessions, Psychoeducation, Medication administration, Evaluate responses to treatment, Monitor vital signs and CBGs as ordered, Perform/monitor CIWA, COWS, AIMS and Fall Risk screenings as ordered, Perform wound care treatments as ordered.  Evaluation of Outcomes: Not Met   LCSW Treatment Plan for Primary Diagnosis: Delusional disorder [facility]) Long Term Goal(s): Safe transition to appropriate next level of care at discharge, Engage patient in therapeutic group addressing interpersonal concerns.  Short Term Goals: Engage patient in aftercare planning with referrals and resources, Increase social support, Increase emotional regulation, Identify triggers associated with mental health/substance abuse issues and Increase skills for wellness and recovery  Therapeutic Interventions: Assess for all discharge needs, 1 to 1 time with Social worker, Explore available resources and support systems, Assess for adequacy in community support network, Educate family and significant other(s) on suicide prevention, Complete Psychosocial Assessment, Interpersonal group therapy.  Evaluation of Outcomes: Not Met   Progress in Treatment: Attending groups: No. Participating in groups: No. Taking medication as prescribed: Yes. Toleration medication: Yes. Family/Significant other contact made: No, will contact:  supports if consents are granted. Patient understands diagnosis: No. Discussing patient identified problems/goals with staff: Yes Medical problems stabilized or resolved: Yes. Denies suicidal/homicidal ideation: Yes. Issues/concerns per patient self-inventory: Yes.   New problem(s) identified: Yes, Describe:  homelessness, limited social supports  New Short Term/Long Term  Goal(s): medication management for mood stabilization; elimination of SI thoughts; development of comprehensive mental wellness/sobriety plan.  Patient Goals:  Patient sleeping, did not provide a goal.  Discharge Plan or Barriers: Patient is homeless but reportedly has stable housing next week. Following up with outpatient providers  Reason for Continuation of Hospitalization: Anxiety Delusions  Depression Hallucinations Medication stabilization  Estimated Length of Stay: 3-5 days  Attendees: Patient: [name] 07/15/2019 10:39 AM  Physician: Dr.Farah 07/15/2019 10:39 AM  Nursing: Mary Sella, RN 07/15/2019 10:39 AM  RN Care Manager: 07/15/2019 10:39 AM  Social Worker: Stephanie Acre, [place] 07/15/2019 10:39 AM  Recreational Therapist:  07/15/2019 10:39 AM  Other:  07/15/2019 10:39 AM  Other:  07/15/2019 10:39 AM  Other: 07/15/2019 10:39 AM    Scribe for Treatment Team: Joellen Jersey, [place] 07/15/2019 10:39 AM

## 2019-07-15 NOTE — Progress Notes (Signed)
D:Pt has been animated and anxious on the unit. Pt was fixated on Neurontin and would not take her scheduled medication until doctor gave an order. Pt stood in the hall stating that she was convulsing without her medication. Pt later took her medication. She became fixated on eggs that she ate in the cafeteria. She said they were salty and she needed yogurt to flush out the salt. A:Offered support, encouragement and 15 minute checks. R:Pt denies si and hi. Safety maintained on the unit.

## 2019-07-15 NOTE — Progress Notes (Signed)
Recreation Therapy Notes  Date: 9.18.20 Time: 1000 Location: 500 Hall Day Room  Group Topic: Communication, Team Building, Problem Solving  Goal Area(s) Addresses:  Patient will effectively work with peer towards shared goal.  Patient will identify skill used to make activity successful.  Patient will identify how skills used during activity can be used to reach post d/c goals.   Intervention: STEM Activity   Activity:  [employer].  In groups, patients were given 12 pipe cleaner.  Patients were to build a free standing tower as tall as they could get it.  Throughout the course of the activity, there will be budget cuts and patients will lose the use of one hand and the ability to communicate with each other.  Once funding is restored, patients will gradually add back the use of their hands and ability to communicate.  Education: Education officer, community, Dentist.   Education Outcome: Acknowledges education  Clinical Observations/Feedback:  Pt did not attend group.    [name], LRT/CTRS      [name], Marisha Renier A 07/15/2019 11:14 AM

## 2019-07-15 NOTE — Progress Notes (Signed)
SPIRITUALITY GROUP NOTE  Spirituality group facilitated by Simone Curia, MDiv, [place].  Group Description:  Group focused on topic of hope.  Patients participated in facilitated discussion around topic, connecting with one another around experiences and definitions for hope.  Group members engaged with visual explorer photos, reflecting on what hope looks like for them today.  Group engaged in discussion around how their definitions of hope are present today in hospital.   Modalities: Psycho-social ed, Adlerian, Narrative, MI Patient Progress: Pt did not attend

## 2019-07-15 NOTE — Progress Notes (Signed)
D: Pt denies SI/HI/AVH. Pt continues to express she needs Neurontin. Pt informed that she would have to talk to the doctor for an order of Neurotin because it's not ordered at this time.  A: Pt was offered support and encouragement. Pt was given scheduled medications. Pt was encourage to attend groups. Q 15 minute checks were done for safety.  R: safety maintained on unit.

## 2019-07-16 DIAGNOSIS — F22 Delusional disorders: Secondary | ICD-10-CM

## 2019-07-16 NOTE — Progress Notes (Addendum)
Patient ID: [name], female   DOB: 08/18/1966, 53 y.o.   MRN: [id number]  [place] NOVEL CORONAVIRUS (COVID-19) DAILY CHECK-OFF SYMPTOMS - answer yes or no to each - every day NO YES  Have you had a fever in the past 24 hours?  . Fever (Temp > 37.80C / 100F) X   Have you had any of these symptoms in the past 24 hours? . New Cough .  Sore Throat  .  Shortness of Breath .  Difficulty Breathing .  Unexplained Body Aches   X   Have you had any one of these symptoms in the past 24 hours not related to allergies?   . Runny Nose .  Nasal Congestion .  Sneezing   X   If you have had runny nose, nasal congestion, sneezing in the past 24 hours, has it worsened?  X   EXPOSURES - check yes or no X   Have you traveled outside the state in the past 14 days?  X   Have you been in contact with someone with a confirmed diagnosis of COVID-19 or PUI in the past 14 days without wearing appropriate PPE?  X   Have you been living in the same home as a person with confirmed diagnosis of COVID-19 or a PUI (household contact)?    X   Have you been diagnosed with COVID-19?    X              What to do next: Answered NO to all: Answered YES to anything:   Proceed with unit schedule Follow the [facility] Inpatient Flowsheet.   

## 2019-07-16 NOTE — Progress Notes (Signed)
[facility] MD Progress Note  07/16/2019 11:59 AM [name]  MRN:  [id number] Subjective: Patient is a 53 year old female with a past psychiatric history significant for schizophrenia or delusional disorder who was admitted on 9/17 secondary to worsening delusional thinking and paranoia.  Objective: Patient is seen and examined.  Patient is a 53 year old female with the above-stated past psychiatric history who is seen in follow-up.  Review of nursing notes revealed that last night that the patient was fixated on her Neurontin, and would not take her scheduled medications.  Her physician notes from yesterday showed that she was guarded, but minimally talkative.  She remained paranoid.  She did discuss "I am having seizures", "I need Neurontin".  She believes that without the Neurontin something bad will happen.  Her current medications include Cogentin, carvedilol, Prolixin, Neurontin, and Restoril.  Her vital signs are stable, she is afebrile.  She slept 6.5 hours last night.  The patient stated she took her medications this morning.  She remains delusional.  She remains hyper religious.  She is focused on the fact that she needs her job back, not break the law after having read the lawyers letter. Principal Problem: Delusional disorder ([place]) Diagnosis: Principal Problem:   Delusional disorder ([place]) Active Problems:   Anxiety   Schizophrenia ([place])   MDD (major depressive disorder)  Total Time spent with patient: 20 minutes  Past Psychiatric History: See admission H&P  Past Medical History:  Past Medical History:  Diagnosis Date  . Anxiety   . Delusional disorder ([place])   . Diabetes mellitus without complication ([place])   . Homelessness   . Hypertension   . Major depressive disorder, recurrent, severe with psychotic features ([place])   . Overdose   . Paranoid schizophrenia ([place])   . Schizophrenia ([place])   . Tremors of nervous system    History reviewed. No pertinent surgical history. Family  History:  Family History  Problem Relation Age of Onset  . Hypertension Mother   . Diabetes Mother   . Aneurysm Mother   . Hypertension Father   . Diabetes Father   . Hypertension Sister   . Diabetes Sister   . Hypertension Other   . Diabetes Other    Family Psychiatric  History: See admission H&P Social History:  Social History   Substance and Sexual Activity  Alcohol Use No     Social History   Substance and Sexual Activity  Drug Use No    Social History   Socioeconomic History  . Marital status: Single    Spouse name: Not on file  . Number of children: Not on file  . Years of education: Not on file  . Highest education level: Not on file  Occupational History  . Not on file  Social Needs  . Financial resource strain: Not on file  . Food insecurity    Worry: Not on file    Inability: Not on file  . Transportation needs    Medical: Not on file    Non-medical: Not on file  Tobacco Use  . Smoking status: Never Smoker  . Smokeless tobacco: Never Used  Substance and Sexual Activity  . Alcohol use: No  . Drug use: No  . Sexual activity: Never  Lifestyle  . Physical activity    Days per week: Not on file    Minutes per session: Not on file  . Stress: Not on file  Relationships  . Social connections    Talks on phone: Not on file  Gets together: Not on file    Attends religious service: Not on file    Active member of club or organization: Not on file    Attends meetings of clubs or organizations: Not on file    Relationship status: Not on file  Other Topics Concern  . Not on file  Social History Narrative   Lives alone in an apartment. Has steps up to apartment      Right handed      Highest level of edu- Some college      Unemployed   Additional Social History:                         Sleep: Good  Appetite:  Good  Current Medications: Current Facility-Administered Medications  Medication Dose Route Frequency Provider Last Rate  Last Dose  . acetaminophen (TYLENOL) tablet 650 mg  650 mg Oral Q6H PRN Dixon, Rashaun M, NP      . alum & mag hydroxide-simeth (MAALOX/MYLANTA) 200-200-20 MG/5ML suspension 30 mL  30 mL Oral Q4H PRN Dixon, Rashaun M, NP      . benztropine (COGENTIN) tablet 1 mg  1 mg Oral BID Johnn Hai, MD   1 mg at 07/16/19 0819  . carvedilol (COREG) tablet 12.5 mg  12.5 mg Oral BID WC Johnn Hai, MD   12.5 mg at 07/16/19 0818  . fluPHENAZine (PROLIXIN) tablet 5 mg  5 mg Oral TID Johnn Hai, MD   5 mg at 07/16/19 0819  . gabapentin (NEURONTIN) capsule 300 mg  300 mg Oral TID Johnn Hai, MD   300 mg at 07/16/19 [id number]  . hydrOXYzine (ATARAX/VISTARIL) tablet 50 mg  50 mg Oral TID PRN Deloria Lair, NP      . magnesium hydroxide (MILK OF MAGNESIA) suspension 30 mL  30 mL Oral Daily PRN Dixon, Rashaun M, NP      . temazepam (RESTORIL) capsule 30 mg  30 mg Oral QHS Johnn Hai, MD      . traZODone (DESYREL) tablet 100 mg  100 mg Oral QHS PRN Deloria Lair, NP        Lab Results: No results found for this or any previous visit (from the past 20 hour(s)).  Blood Alcohol level:  Lab Results  Component Value Date   ETH <10 07/13/2019   ETH <10 99991111    Metabolic Disorder Labs: Lab Results  Component Value Date   HGBA1C 6.6 (H) 06/05/2019   MPG 142.72 06/05/2019   MPG 148.46 02/23/2019   No results found for: PROLACTIN Lab Results  Component Value Date   CHOL 220 (H) 06/05/2019   TRIG 210 (H) 06/05/2019   HDL 39 (L) 06/05/2019   CHOLHDL 5.6 06/05/2019   VLDL 42 (H) 06/05/2019   LDLCALC 139 (H) 06/05/2019   LDLCALC 133 (H) 02/23/2019    Physical Findings: AIMS: Facial and Oral Movements Muscles of Facial Expression: None, normal Lips and Perioral Area: None, normal Jaw: None, normal Tongue: None, normal,Extremity Movements Upper (arms, wrists, hands, fingers): None, normal Lower (legs, knees, ankles, toes): None, normal, Trunk Movements Neck, shoulders, hips: None, normal,  Overall Severity Severity of abnormal movements (highest score from questions above): None, normal Incapacitation due to abnormal movements: None, normal Patient's awareness of abnormal movements (rate only patient's report): No Awareness, Dental Status Current problems with teeth and/or dentures?: No Does patient usually wear dentures?: No  CIWA:  CIWA-Ar Total: 0 COWS:  COWS Total Score: 1  Musculoskeletal: Strength & Muscle Tone: within normal limits Gait & Station: normal Patient leans: N/A  Psychiatric Specialty Exam: Physical Exam  Nursing note and vitals reviewed. Constitutional: She appears well-developed.  HENT:  Head: Atraumatic.  Respiratory: Effort normal.  Neurological: She is alert.    ROS  Blood pressure 133/82, pulse 80, temperature 98.2 F (36.8 C), temperature source Oral, resp. rate 18, height 5\' 5"  (1.651 m), weight 119.7 kg, last menstrual period 10/29/2011, SpO2 100 %.Body mass index is 43.93 kg/m.  General Appearance: Disheveled  Eye Contact:  Minimal  Speech:  Pressured  Volume:  Increased  Mood:  Anxious, Dysphoric and Irritable  Affect:  Labile  Thought Process:  Goal Directed and Descriptions of Associations: Tangential  Orientation:  Negative  Thought Content:  Delusions, Paranoid Ideation, Rumination and Tangential  Suicidal Thoughts:  No  Homicidal Thoughts:  No  Memory:  Immediate;   Fair Recent;   Fair Remote;   Fair  Judgement:  Impaired  Insight:  Lacking  Psychomotor Activity:  Increased  Concentration:  Concentration: Fair and Attention Span: Fair  Recall:  [employer] of Knowledge:  Fair  Language:  Good  Akathisia:  Negative  Handed:  Right  AIMS (if indicated):     Assets:  Desire for Improvement Resilience  ADL's:  Impaired  Cognition:  WNL  Sleep:  Number of Hours: 6.5     Treatment Plan Summary: Daily contact with patient to assess and evaluate symptoms and progress in treatment, Medication management and Plan :  Patient is seen and examined.  Patient is a 53 year old female with the above-stated past psychiatric history who is seen in follow-up.   Diagnosis: #1 delusional disorder versus schizophrenia, #2 hypertension  Patient is seen in follow-up.  She appears to be essentially unchanged from yesterday.  She stated she did take her medications this morning.  I encouraged her to be compliant with her medications.  She remains delusional, hyperreligious and focused on "convulsions".  Again because of her noncompliance we will continue her medications as written.  If necessary forced medications may become important. 1.  Continue Cogentin 1 mg p.o. twice daily for side effects of medications. 2.  Continue carvedilol 12.5 mg p.o. twice daily for hypertension. 3.  Continue Prolixin 5 mg p.o. 3 times daily for psychosis. 4.  Continue Neurontin 300 mg p.o. 3 times daily for mood stability. 5.  Continue hydroxyzine 50 mg p.o. 3 times daily as needed anxiety. 6.  Continue temazepam 30 mg p.o. nightly for insomnia. 7.  Continue trazodone 100 mg p.o. nightly as needed insomnia. 8.  Disposition planning-in progress.  Sharma Covert, MD 07/16/2019, 11:59 AM

## 2019-07-16 NOTE — Progress Notes (Signed)
[place] Group Notes:  (Nursing/MHT/Case Management/Adjunct)  Date:  07/16/2019  Time:  0915 am  Type of Therapy:  Nurse Education  Participation Level:  Did Not Attend, The pt declined to attend group when notified by writer.    Bringle, Keianna Signer L 9/19/202

## 2019-07-16 NOTE — [facility] Counselor (Signed)
Clinical Social Work Note  CSW entered patient's room after knocking, attempted to awaken her by saying her name, in order to do Psychosocial Assessment.  Her lunch remained untouched on the second bed in her room, and she did not wake up.  CSW will attempt again tomorrow.  Selmer Dominion, LCSW 07/16/2019, 4:08 PM

## 2019-07-16 NOTE — Plan of Care (Signed)
  Problem: Education: Goal: Verbalization of understanding the information provided will improve Outcome: Progressing   Problem: Activity: Goal: Interest or engagement in activities will improve Outcome: Not Progressing   Problem: Safety: Goal: Periods of time without injury will increase Outcome: Progressing   D: Pt alert and oriented on the unit. Pt denies SI/HI, A/VH. Pt's affect was flat and sad, and she was isolative asleep in her bed all day.  A: Education, support and encouragement provided, q15 minute safety checks remain in effect. Medications administered per MD orders. R: No reactions/side effects to medicine noted. Pt denies any concerns at this time, and verbally contracts for safety. Pt ambulating on the unit with no issues. Pt remains safe on the unit.

## 2019-07-16 NOTE — [facility] Group Notes (Signed)
[place] Group Notes: (Clinical Social Work)   07/16/2019      Type of Therapy:  Group Therapy   Participation Level:  Did Not Attend - was invited both individually by MHT and by overhead announcement, chose not to attend.   Selmer Dominion, LCSW 07/16/2019, 1:50 PM

## 2019-07-16 NOTE — Progress Notes (Signed)
D.  Minimal interaction on approach, Pt appears guarded and paranoid.  Pt denies SI/HI/AVH at this time.  Pt did attend evening wrap up group, see group notes.  PT did not come to medication window for med pass this evening.  A.  Support and encouragement offered  R.  Pt remains safe on the unit, will continue to monitor.

## 2019-07-16 NOTE — Progress Notes (Signed)
Adult Psychoeducational Group Note  Date:  07/16/2019 Time:  8:59 PM  Group Topic/Focus:  Wrap-Up Group:   The focus of this group is to help patients review their daily goal of treatment and discuss progress on daily workbooks.  Participation Level:  Minimal  Participation Quality:  Resistant  Affect:  Blunted, Flat, Irritable and Resistant  Cognitive:  Disorganized and Confused  Insight: Lacking and Limited  Engagement in Group:  Lacking and Limited  Modes of Intervention:  Discussion  Additional Comments:  Pt stated her goal for today was to focus on her treatment and to get some rest. Pt stated she did not know if she  accomplished her goal today. Pt stated her relationship with her family needs to be improved. Pt stated she felt better about herself today. Pt rated her overall day a 9 out of 10. Pt stated her appetite was pretty good today. Pt stated her goal for tonight is to get another good night's rest. Pt stated she had no pain tonight.Pt stated if anything change she would alert staff.  Candy Sledge 07/16/2019, 8:59 PM

## 2019-07-17 MED ORDER — FLUPHENAZINE HCL 5 MG PO TABS
5.0000 mg | ORAL_TABLET | Freq: Two times a day (BID) | ORAL | Status: DC
  Administered 2019-07-17 – 2019-07-20 (×6): 5 mg via ORAL
  Filled 2019-07-17 (×10): qty 1

## 2019-07-17 MED ORDER — FLUPHENAZINE HCL 10 MG PO TABS
10.0000 mg | ORAL_TABLET | Freq: Every day | ORAL | Status: DC
  Administered 2019-07-17 – 2019-07-19 (×3): 10 mg via ORAL
  Filled 2019-07-17 (×5): qty 1

## 2019-07-17 NOTE — Plan of Care (Signed)
  Problem: Safety: Goal: Periods of time without injury will increase Outcome: Progressing   Problem: Activity: Goal: Will identify at least one activity in which they can participate Outcome: Progressing   D: Pt alert and oriented on the unit. Pt denies SI/HI, A/VH. Pt's affect was flat and mood was depressed on approach. Pt was isolative asleep in her room for most of the day but did attend morning group. Pt is cooperative.  A: Education, support and encouragement provided, q15 minute safety checks remain in effect. Medications administered per MD orders.  R: No reactions/side effects to medicine noted. Pt denies any concerns at this time, and verbally contracts for safety. Pt ambulating on the unit with no issues. Pt remains safe on the unit.

## 2019-07-17 NOTE — Progress Notes (Signed)
[facility] MD Progress Note  07/17/2019 11:06 AM [name]  MRN:  [id number] Subjective:  Patient is a 53 year old female with a past psychiatric history significant for schizophrenia or delusional disorder who was admitted on 9/17 secondary to worsening delusional thinking and paranoia.   Objective: Patient is seen and examined.  Patient is a 53 year old female with the above-stated past psychiatric history who is seen in follow-up.  Patient is essentially unchanged from yesterday.  Her delusions continue.  She is taking her medications.  She was lying in bed and slightly sedated this morning, but still focused on the fact that "the police have told me that I have to get my job back".  Her hyper religious delusions also are present, but not as prominent as yesterday.  That may be secondary to her sedation.  Her vital signs are stable, she is afebrile.  She only slept 3 hours last night.  I do not think that she took the temazepam last night.  Principal Problem: Delusional disorder ([place]) Diagnosis: Principal Problem:   Delusional disorder ([place]) Active Problems:   Anxiety   Schizophrenia ([place])   MDD (major depressive disorder)  Total Time spent with patient: 15 minutes  Past Psychiatric History: See admission H&P  Past Medical History:  Past Medical History:  Diagnosis Date  . Anxiety   . Delusional disorder ([place])   . Diabetes mellitus without complication ([place])   . Homelessness   . Hypertension   . Major depressive disorder, recurrent, severe with psychotic features ([place])   . Overdose   . Paranoid schizophrenia ([place])   . Schizophrenia ([place])   . Tremors of nervous system    History reviewed. No pertinent surgical history. Family History:  Family History  Problem Relation Age of Onset  . Hypertension Mother   . Diabetes Mother   . Aneurysm Mother   . Hypertension Father   . Diabetes Father   . Hypertension Sister   . Diabetes Sister   . Hypertension Other   . Diabetes Other     Family Psychiatric  History: See admission H&P Social History:  Social History   Substance and Sexual Activity  Alcohol Use No     Social History   Substance and Sexual Activity  Drug Use No    Social History   Socioeconomic History  . Marital status: Single    Spouse name: Not on file  . Number of children: Not on file  . Years of education: Not on file  . Highest education level: Not on file  Occupational History  . Not on file  Social Needs  . Financial resource strain: Not on file  . Food insecurity    Worry: Not on file    Inability: Not on file  . Transportation needs    Medical: Not on file    Non-medical: Not on file  Tobacco Use  . Smoking status: Never Smoker  . Smokeless tobacco: Never Used  Substance and Sexual Activity  . Alcohol use: No  . Drug use: No  . Sexual activity: Never  Lifestyle  . Physical activity    Days per week: Not on file    Minutes per session: Not on file  . Stress: Not on file  Relationships  . Social Herbalist on phone: Not on file    Gets together: Not on file    Attends religious service: Not on file    Active member of club or organization: Not on file  Attends meetings of clubs or organizations: Not on file    Relationship status: Not on file  Other Topics Concern  . Not on file  Social History Narrative   Lives alone in an apartment. Has steps up to apartment      Right handed      Highest level of edu- Some college      Unemployed   Additional Social History:                         Sleep: Poor  Appetite:  Fair  Current Medications: Current Facility-Administered Medications  Medication Dose Route Frequency Provider Last Rate Last Dose  . acetaminophen (TYLENOL) tablet 650 mg  650 mg Oral Q6H PRN Dixon, Rashaun M, NP      . alum & mag hydroxide-simeth (MAALOX/MYLANTA) 200-200-20 MG/5ML suspension 30 mL  30 mL Oral Q4H PRN Dixon, Rashaun M, NP      . benztropine (COGENTIN) tablet  1 mg  1 mg Oral BID Johnn Hai, MD   1 mg at 07/17/19 [id number]  . carvedilol (COREG) tablet 12.5 mg  12.5 mg Oral BID WC Johnn Hai, MD   12.5 mg at 07/17/19 0839  . fluPHENAZine (PROLIXIN) tablet 5 mg  5 mg Oral TID Johnn Hai, MD   5 mg at 07/17/19 [id number]  . gabapentin (NEURONTIN) capsule 300 mg  300 mg Oral TID Johnn Hai, MD   300 mg at 07/17/19 [id number]  . hydrOXYzine (ATARAX/VISTARIL) tablet 50 mg  50 mg Oral TID PRN Deloria Lair, NP      . magnesium hydroxide (MILK OF MAGNESIA) suspension 30 mL  30 mL Oral Daily PRN Dixon, Rashaun M, NP      . temazepam (RESTORIL) capsule 30 mg  30 mg Oral QHS Johnn Hai, MD   30 mg at 07/16/19 2240  . traZODone (DESYREL) tablet 100 mg  100 mg Oral QHS PRN Deloria Lair, NP        Lab Results: No results found for this or any previous visit (from the past 48 hour(s)).  Blood Alcohol level:  Lab Results  Component Value Date   ETH <10 07/13/2019   ETH <10 99991111    Metabolic Disorder Labs: Lab Results  Component Value Date   HGBA1C 6.6 (H) 06/05/2019   MPG 142.72 06/05/2019   MPG 148.46 02/23/2019   No results found for: PROLACTIN Lab Results  Component Value Date   CHOL 220 (H) 06/05/2019   TRIG 210 (H) 06/05/2019   HDL 39 (L) 06/05/2019   CHOLHDL 5.6 06/05/2019   VLDL 42 (H) 06/05/2019   LDLCALC 139 (H) 06/05/2019   LDLCALC 133 (H) 02/23/2019    Physical Findings: AIMS: Facial and Oral Movements Muscles of Facial Expression: None, normal Lips and Perioral Area: None, normal Jaw: None, normal Tongue: None, normal,Extremity Movements Upper (arms, wrists, hands, fingers): None, normal Lower (legs, knees, ankles, toes): None, normal, Trunk Movements Neck, shoulders, hips: None, normal, Overall Severity Severity of abnormal movements (highest score from questions above): None, normal Incapacitation due to abnormal movements: None, normal Patient's awareness of abnormal movements (rate only patient's report): No  Awareness, Dental Status Current problems with teeth and/or dentures?: No Does patient usually wear dentures?: No  CIWA:  CIWA-Ar Total: 0 COWS:  COWS Total Score: 1  Musculoskeletal: Strength & Muscle Tone: within normal limits Gait & Station: normal Patient leans: N/A  Psychiatric Specialty Exam: Physical Exam  Nursing note and vitals  reviewed. Constitutional: She is oriented to person, place, and time. She appears well-developed and well-nourished.  HENT:  Head: Normocephalic and atraumatic.  Respiratory: Effort normal.  Neurological: She is alert and oriented to person, place, and time.    ROS  Blood pressure 116/80, pulse 81, temperature 98.2 F (36.8 C), temperature source Oral, resp. rate 18, height 5\' 5"  (1.651 m), weight 119.7 kg, last menstrual period 10/29/2011, SpO2 100 %.Body mass index is 43.93 kg/m.  General Appearance: Disheveled  Eye Contact:  Fair  Speech:  Normal Rate  Volume:  Normal  Mood:  Anxious and Dysphoric  Affect:  Congruent  Thought Process:  Goal Directed and Descriptions of Associations: Loose  Orientation:  Full (Time, Place, and Person)  Thought Content:  Delusions, Paranoid Ideation and Rumination  Suicidal Thoughts:  No  Homicidal Thoughts:  No  Memory:  Immediate;   Fair Recent;   Fair Remote;   Fair  Judgement:  Intact  Insight:  Lacking  Psychomotor Activity:  Decreased  Concentration:  Concentration: Fair and Attention Span: Fair  Recall:  [employer] of Knowledge:  Fair  Language:  Fair  Akathisia:  Negative  Handed:  Right  AIMS (if indicated):     Assets:  Desire for Improvement Resilience  ADL's:  Intact  Cognition:  WNL  Sleep:  Number of Hours: 3     Treatment Plan Summary: Daily contact with patient to assess and evaluate symptoms and progress in treatment, Medication management and Plan : Patient is seen and examined.  Patient is a 53 year old female with the above-stated past psychiatric history who is seen in  follow-up.  Diagnosis: #1 delusional disorder versus schizophrenia, #2 hypertension  Patient is seen in follow-up.  She is essentially unchanged from yesterday.  She still not sleeping well.  I am going to increase her Prolixin at bedtime to 10 mg.  We will continue the Prolixin 5 mg p.o. twice daily as well.  No other changes in her medications at this point.  1.  Continue Cogentin 1 mg p.o. twice daily for side effects of medications. 2.  Continue carvedilol 12.5 mg p.o. twice daily for hypertension. 3.  Change Prolixin to 5 mg p.o. BID and 10 mg p.o. nightly for psychosis. 4.  Continue Neurontin 300 mg p.o. 3 times daily for mood stability. 5.  Continue hydroxyzine 50 mg p.o. 3 times daily as needed anxiety. 6.  Continue temazepam 30 mg p.o. nightly for insomnia. 7.  Continue trazodone 100 mg p.o. nightly as needed insomnia. 8.  Disposition planning-in progress. Sharma Covert, MD 07/17/2019, 11:06 AM
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
## 2019-07-17 NOTE — Progress Notes (Signed)
[facility] Group Notes:  (Nursing/MHT/Case Management/Adjunct)  Date:  07/17/2019  Time:  0900 am  Type of Therapy:  Nurse Education Healthy Support Systems and Museum/gallery conservator.  Participation Level:  Active  Participation Quality:  Appropriate  Affect:  Appropriate  Cognitive:  Appropriate  Insight:  Appropriate  Engagement in Group:  Engaged  Modes of Intervention:  Activity and Discussion  Summary of Progress/Problems: Pt completed a collage and shared it with the group.   P[place], Ed Rayson L 07/17/2019

## 2019-07-17 NOTE — BHH Group Notes (Signed)
[facility] [place] Group Notes: (Clinical Social Work)   07/17/2019      Type of Therapy:  Group Therapy   Participation Level:  Did Not Attend - was invited both individually by MHT and by overhead announcement, chose not to attend.   Selmer Dominion, LCSW 07/17/2019, 11:26 AM

## 2019-07-17 NOTE — Progress Notes (Signed)
Patient ID: [name], female   DOB: 08/19/1966, 53 y.o.   MRN: [id number]  [facility] NOVEL CORONAVIRUS (COVID-19) DAILY CHECK-OFF SYMPTOMS - answer yes or no to each - every day NO YES  Have you had a fever in the past 24 hours?  . Fever (Temp > 37.80C / 100F) X   Have you had any of these symptoms in the past 24 hours? . New Cough .  Sore Throat  .  Shortness of Breath .  Difficulty Breathing .  Unexplained Body Aches   X   Have you had any one of these symptoms in the past 24 hours not related to allergies?   . Runny Nose .  Nasal Congestion .  Sneezing   X   If you have had runny nose, nasal congestion, sneezing in the past 24 hours, has it worsened?  X   EXPOSURES - check yes or no X   Have you traveled outside the state in the past 14 days?  X   Have you been in contact with someone with a confirmed diagnosis of COVID-19 or PUI in the past 14 days without wearing appropriate PPE?  X   Have you been living in the same home as a person with confirmed diagnosis of COVID-19 or a PUI (household contact)?    X   Have you been diagnosed with COVID-19?    X              What to do next: Answered NO to all: Answered YES to anything:   Proceed with unit schedule Follow the [facility] Inpatient Flowsheet.   

## 2019-07-18 NOTE — [facility] Group Notes (Signed)
LCSW Group Therapy Notes 07/18/2019 2:26 PM  Type of Therapy and Topic: Group Therapy: Overcoming Obstacles  Participation Level: Did Not Attend  Description of Group:  In this group patients will be encouraged to explore what they see as obstacles to their own wellness and recovery. They will be guided to discuss their thoughts, feelings, and behaviors related to these obstacles. The group will process together ways to cope with barriers, with attention given to specific choices patients can make. Each patient will be challenged to identify changes they are motivated to make in order to overcome their obstacles. This group will be process-oriented, with patients participating in exploration of their own experiences as well as giving and receiving support and challenge from other group members.  Therapeutic Goals: 1. Patient will identify personal and current obstacles as they relate to admission. 2. Patient will identify barriers that currently interfere with their wellness or overcoming obstacles.  3. Patient will identify feelings, thought process and behaviors related to these barriers. 4. Patient will identify two changes they are willing to make to overcome these obstacles:   Summary of Patient Progress     Therapeutic Modalities:  Cognitive Behavioral Therapy Solution Focused Therapy Motivational Interviewing Relapse Prevention [place], MSW, [facility] 07/18/2019 2:26 PM

## 2019-07-18 NOTE — Progress Notes (Signed)
Recreation Therapy Notes  Date: 9.21.20 Time: 1000 Location: 500 Hall Dayroom  Group Topic: Communication, Team Building, Problem Solving  Goal Area(s) Addresses:  Patient will effectively work with peer towards shared goal.  Patient will identify skill used to make activity successful.  Patient will identify how skills used during activity can be used to reach post d/c goals.   Intervention: STEM Activity   Activity: In team's, using 10 red plastic cups, patients were asked to build a pyramid using a rubber band with four to five strings attached to it.    Education: Education officer, community, Dentist.   Education Outcome: Acknowledges education/In group clarification offered/Needs additional education.   Clinical Observations/Feedback: Pt did not attend group.    [name], LRT/CTRS    [name], Syna Gad A 07/18/2019 10:51 AM

## 2019-07-18 NOTE — Progress Notes (Signed)
D.  Pt pleasant on approach, continues to be somatic.  Pt requested that her BP be taken,  and also requested snack stating that she had only had a salad earlier.  Pt BP was good, snack provided.  Pt returned to her room directly after med pass, requested her night medications to be given early.  Pt denies SI/HI/AVH at this time.  A.  Support and encouragement offered, medication given as ordered  R.  Pt remains safe on the unit, will continue to monitor.

## 2019-07-18 NOTE — Progress Notes (Signed)
Adult Psychoeducational Group Note  Date:  07/18/2019 Time:  1:22 AM  Group Topic/Focus:  Wrap-Up Group:   The focus of this group is to help patients review their daily goal of treatment and discuss progress on daily workbooks.  Participation Level:  Did Not Attend  Participation Quality:  Did Not Attend  Affect:  Did Not Attend  Cognitive:  Did Not Attend  Insight: None  Engagement in Group:  Did Not Attend  Modes of Intervention:  Did Not Attend  Additional Comments:  Pt did not attend evening wrap up group tonight.  Candy Sledge 07/18/2019, 1:22 AM

## 2019-07-18 NOTE — Progress Notes (Addendum)
Recreation Therapy Notes  09.21.20 1243:  LRT went to patient room to conduct assessment.  LRT explained to patient what the assessment entailed but pt declined saying she didn't feel up to it.    [name], LRT/CTRS    [name] A 07/18/2019 1:39 PM

## 2019-07-18 NOTE — Progress Notes (Signed)
[facility] MD Progress Note  07/18/2019 11:53 AM [name]  MRN:  [id number] Subjective:   Patient sleeping a great deal in the daytime continues to have delusional believes but less intense today no auditory visual hallucination generally compliant but lacking insight.  Mood is generally stable without mania seems to be euthymic.  No EPS or TD Principal Problem: Delusional disorder ([place]) Diagnosis: Principal Problem:   Delusional disorder ([place]) Active Problems:   Anxiety   Schizophrenia ([place])   MDD (major depressive disorder)  Total Time spent with patient: 20 minutes  Past Psychiatric History: Previous similar presentations  Past Medical History:  Past Medical History:  Diagnosis Date  . Anxiety   . Delusional disorder ([place])   . Diabetes mellitus without complication ([place])   . Homelessness   . Hypertension   . Major depressive disorder, recurrent, severe with psychotic features ([place])   . Overdose   . Paranoid schizophrenia ([place])   . Schizophrenia ([place])   . Tremors of nervous system    History reviewed. No pertinent surgical history. Family History:  Family History  Problem Relation Age of Onset  . Hypertension Mother   . Diabetes Mother   . Aneurysm Mother   . Hypertension Father   . Diabetes Father   . Hypertension Sister   . Diabetes Sister   . Hypertension Other   . Diabetes Other    Family Psychiatric  History: No new data Social History:  Social History   Substance and Sexual Activity  Alcohol Use No     Social History   Substance and Sexual Activity  Drug Use No    Social History   Socioeconomic History  . Marital status: Single    Spouse name: Not on file  . Number of children: Not on file  . Years of education: Not on file  . Highest education level: Not on file  Occupational History  . Not on file  Social Needs  . Financial resource strain: Not on file  . Food insecurity    Worry: Not on file    Inability: Not on file  . Transportation  needs    Medical: Not on file    Non-medical: Not on file  Tobacco Use  . Smoking status: Never Smoker  . Smokeless tobacco: Never Used  Substance and Sexual Activity  . Alcohol use: No  . Drug use: No  . Sexual activity: Never  Lifestyle  . Physical activity    Days per week: Not on file    Minutes per session: Not on file  . Stress: Not on file  Relationships  . Social Herbalist on phone: Not on file    Gets together: Not on file    Attends religious service: Not on file    Active member of club or organization: Not on file    Attends meetings of clubs or organizations: Not on file    Relationship status: Not on file  Other Topics Concern  . Not on file  Social History Narrative   Lives alone in an apartment. Has steps up to apartment      Right handed      Highest level of edu- Some college      Unemployed   Additional Social History:                         Sleep: Fair  Appetite:  Fair  Current Medications: Current Facility-Administered Medications  Medication  Dose Route Frequency Provider Last Rate Last Dose  . acetaminophen (TYLENOL) tablet 650 mg  650 mg Oral Q6H PRN Dixon, Rashaun M, NP      . alum & mag hydroxide-simeth (MAALOX/MYLANTA) 200-200-20 MG/5ML suspension 30 mL  30 mL Oral Q4H PRN Dixon, Rashaun M, NP      . benztropine (COGENTIN) tablet 1 mg  1 mg Oral BID Johnn Hai, MD   1 mg at 07/18/19 [id number]  . carvedilol (COREG) tablet 12.5 mg  12.5 mg Oral BID WC Johnn Hai, MD   12.5 mg at 07/18/19 [id number]  . fluPHENAZine (PROLIXIN) tablet 10 mg  10 mg Oral QHS Sharma Covert, MD   10 mg at 07/17/19 2258  . fluPHENAZine (PROLIXIN) tablet 5 mg  5 mg Oral BID Sharma Covert, MD   5 mg at 07/18/19 [id number]  . gabapentin (NEURONTIN) capsule 300 mg  300 mg Oral TID Johnn Hai, MD   300 mg at 07/18/19 [id number]  . hydrOXYzine (ATARAX/VISTARIL) tablet 50 mg  50 mg Oral TID PRN Deloria Lair, NP      . magnesium hydroxide (MILK OF  MAGNESIA) suspension 30 mL  30 mL Oral Daily PRN Dixon, Rashaun M, NP      . temazepam (RESTORIL) capsule 30 mg  30 mg Oral QHS Johnn Hai, MD   30 mg at 07/17/19 2257  . traZODone (DESYREL) tablet 100 mg  100 mg Oral QHS PRN Deloria Lair, NP        Lab Results: No results found for this or any previous visit (from the past 48 hour(s)).  Blood Alcohol level:  Lab Results  Component Value Date   ETH <10 07/13/2019   ETH <10 [id number]    Metabolic Disorder Labs: Lab Results  Component Value Date   HGBA1C 6.6 (H) 06/05/2019   MPG 142.72 06/05/2019   MPG 148.46 02/23/2019   No results found for: PROLACTIN Lab Results  Component Value Date   CHOL 220 (H) 06/05/2019   TRIG 210 (H) 06/05/2019   HDL 39 (L) 06/05/2019   CHOLHDL 5.6 06/05/2019   VLDL 42 (H) 06/05/2019   LDLCALC 139 (H) 06/05/2019   LDLCALC 133 (H) 02/23/2019    Physical Findings: AIMS: Facial and Oral Movements Muscles of Facial Expression: None, normal Lips and Perioral Area: None, normal Jaw: None, normal Tongue: None, normal,Extremity Movements Upper (arms, wrists, hands, fingers): None, normal Lower (legs, knees, ankles, toes): None, normal, Trunk Movements Neck, shoulders, hips: None, normal, Overall Severity Severity of abnormal movements (highest score from questions above): None, normal Incapacitation due to abnormal movements: None, normal Patient's awareness of abnormal movements (rate only patient's report): No Awareness, Dental Status Current problems with teeth and/or dentures?: No Does patient usually wear dentures?: No  CIWA:  CIWA-Ar Total: 0 COWS:  COWS Total Score: 1  Musculoskeletal: Strength & Muscle Tone: within normal limits Gait & Station: normal Patient leans: N/A  Psychiatric Specialty Exam: Physical Exam  ROS  Blood pressure 110/77, pulse 98, temperature 98.2 F (36.8 C), temperature source Oral, resp. rate 18, height 5\' 5"  (1.651 m), weight 119.7 kg, last menstrual  period 10/29/2011, SpO2 100 %.Body mass index is 43.93 kg/m.  General Appearance: Casual  Eye Contact:  Fair  Speech:  Pressured  Volume:  Decreased  Mood:  Euthymic  Affect:  Congruent and Constricted  Thought Process:  Irrelevant and Descriptions of Associations: Tangential  Orientation:  Full (Time, Place, and Person)  Thought Content:  Illogical and  Delusions  Suicidal Thoughts:  No  Homicidal Thoughts:  No  Memory:  Immediate;   Poor Recent;   Fair  Judgement:  Fair  Insight:  Fair overall but still delusional  Psychomotor Activity:  Normal  Concentration:  Concentration: Fair and Attention Span: Fair  Recall:  [employer] of Knowledge:  Fair  Language:  Fair  Akathisia:  Negative  Handed:  Right  AIMS (if indicated):     Assets:  Communication Skills Desire for Improvement  ADL's:  Intact  Cognition:  WNL  Sleep:  Number of Hours: 6.75     Treatment Plan Summary: Daily contact with patient to assess and evaluate symptoms and progress in treatment and Medication management  Continue cognitive therapy continue to monitor for safety no changes in precautions continue antipsychotic therapy reality based therapy probable discharge this week  Johnn Hai, MD 07/18/2019, 11:53 AM

## 2019-07-19 NOTE — Progress Notes (Signed)
Recreation Therapy Notes  Date: 9.22.20 Time: 1000 Location: 500 Hall Dayroom  Group Topic: Communication  Goal Area(s) Addresses:  Patient will effectively communicate with peers in group.  Patient will verbalize benefit of healthy communication. Patient will verbalize positive effect of healthy communication on post d/c goals.  Patient will identify communication techniques that made activity effective for group.   Intervention:  Paper, pencils  Activity:  Geometrical Drawings.  Patients were given the chance to describe a picture to their peers.  Each patient that described a picture could be as detailed as possible.  The remaining patients could only ask for the person describing the drawing to repeat themselves.  Education: Communication, Discharge Planning  Education Outcome: Acknowledges understanding/In group clarification offered/Needs additional education.   Clinical Observations/Feedback:  Pt did not attend group.    [name], LRT/CTRS     [name], Emaree Chiu A 07/19/2019 11:03 AM

## 2019-07-19 NOTE — Progress Notes (Signed)
D: Pt denies SI/HI/AVH. Pt is pleasant and cooperative. Pt kept to herself this evening, but came out to take her medications.  A: Pt was offered support and encouragement. Pt was given scheduled medications. Pt was encourage to attend groups. Q 15 minute checks were done for safety.  R:.Pt receptive to treatment and safety maintained on unit.  Problem: Education: Goal: Emotional status will improve Outcome: Progressing   Problem: Education: Goal: Mental status will improve Outcome: Progressing

## 2019-07-19 NOTE — Progress Notes (Signed)
[facility] MD Progress Note  07/19/2019 8:27 AM [name]  MRN:  [id number] Subjective:  Patient slept most of the day yesterday she is now alert oriented to person place and situation she is fearful of going home she tells me she is not exactly homeless but due to her fear she does not want to stay in her own place states "the police are involved" they know about it so forth so she still remains in the focus of this fixed delusion but she denies thoughts of harming self or others we may be at an end point we will discuss in team.  She can contract and understands what that means.  She is compliant with meds here. Principal Problem: Delusional disorder ([place]) Diagnosis: Principal Problem:   Delusional disorder ([place]) Active Problems:   Anxiety   Schizophrenia ([place])   MDD (major depressive disorder)  Total Time spent with patient: 20 minutes  Past Psychiatric History: Extensive fixed delusions  Past Medical History:  Past Medical History:  Diagnosis Date  . Anxiety   . Delusional disorder ([place])   . Diabetes mellitus without complication ([place])   . Homelessness   . Hypertension   . Major depressive disorder, recurrent, severe with psychotic features ([place])   . Overdose   . Paranoid schizophrenia ([place])   . Schizophrenia ([place])   . Tremors of nervous system    History reviewed. No pertinent surgical history. Family History:  Family History  Problem Relation Age of Onset  . Hypertension Mother   . Diabetes Mother   . Aneurysm Mother   . Hypertension Father   . Diabetes Father   . Hypertension Sister   . Diabetes Sister   . Hypertension Other   . Diabetes Other    Family Psychiatric  History: No new data Social History:  Social History   Substance and Sexual Activity  Alcohol Use No     Social History   Substance and Sexual Activity  Drug Use No    Social History   Socioeconomic History  . Marital status: Single    Spouse name: Not on file  . Number of children: Not on  file  . Years of education: Not on file  . Highest education level: Not on file  Occupational History  . Not on file  Social Needs  . Financial resource strain: Not on file  . Food insecurity    Worry: Not on file    Inability: Not on file  . Transportation needs    Medical: Not on file    Non-medical: Not on file  Tobacco Use  . Smoking status: Never Smoker  . Smokeless tobacco: Never Used  Substance and Sexual Activity  . Alcohol use: No  . Drug use: No  . Sexual activity: Never  Lifestyle  . Physical activity    Days per week: Not on file    Minutes per session: Not on file  . Stress: Not on file  Relationships  . Social Herbalist on phone: Not on file    Gets together: Not on file    Attends religious service: Not on file    Active member of club or organization: Not on file    Attends meetings of clubs or organizations: Not on file    Relationship status: Not on file  Other Topics Concern  . Not on file  Social History Narrative   Lives alone in an apartment. Has steps up to apartment  Right handed      Highest level of edu- Some college      Unemployed   Additional Social History:                         Sleep: Fair  Appetite:  Fair  Current Medications: Current Facility-Administered Medications  Medication Dose Route Frequency Provider Last Rate Last Dose  . acetaminophen (TYLENOL) tablet 650 mg  650 mg Oral Q6H PRN Dixon, Rashaun M, NP      . alum & mag hydroxide-simeth (MAALOX/MYLANTA) 200-200-20 MG/5ML suspension 30 mL  30 mL Oral Q4H PRN Dixon, Rashaun M, NP      . benztropine (COGENTIN) tablet 1 mg  1 mg Oral BID Johnn Hai, MD   1 mg at 07/19/19 0800  . carvedilol (COREG) tablet 12.5 mg  12.5 mg Oral BID WC Johnn Hai, MD   12.5 mg at 07/19/19 0801  . fluPHENAZine (PROLIXIN) tablet 10 mg  10 mg Oral QHS Sharma Covert, MD   10 mg at 07/18/19 2152  . fluPHENAZine (PROLIXIN) tablet 5 mg  5 mg Oral BID Sharma Covert, MD   5 mg at 07/19/19 0801  . gabapentin (NEURONTIN) capsule 300 mg  300 mg Oral TID Johnn Hai, MD   300 mg at 07/19/19 0800  . hydrOXYzine (ATARAX/VISTARIL) tablet 50 mg  50 mg Oral TID PRN Deloria Lair, NP      . magnesium hydroxide (MILK OF MAGNESIA) suspension 30 mL  30 mL Oral Daily PRN Dixon, Rashaun M, NP      . temazepam (RESTORIL) capsule 30 mg  30 mg Oral QHS Johnn Hai, MD   30 mg at 07/18/19 2152  . traZODone (DESYREL) tablet 100 mg  100 mg Oral QHS PRN Deloria Lair, NP        Lab Results: No results found for this or any previous visit (from the past 48 hour(s)).  Blood Alcohol level:  Lab Results  Component Value Date   ETH <10 07/13/2019   ETH <10 99991111    Metabolic Disorder Labs: Lab Results  Component Value Date   HGBA1C 6.6 (H) 06/05/2019   MPG 142.72 06/05/2019   MPG 148.46 02/23/2019   No results found for: PROLACTIN Lab Results  Component Value Date   CHOL 220 (H) 06/05/2019   TRIG 210 (H) 06/05/2019   HDL 39 (L) 06/05/2019   CHOLHDL 5.6 06/05/2019   VLDL 42 (H) 06/05/2019   LDLCALC 139 (H) 06/05/2019   LDLCALC 133 (H) 02/23/2019    Physical Findings: AIMS: Facial and Oral Movements Muscles of Facial Expression: None, normal Lips and Perioral Area: None, normal Jaw: None, normal Tongue: None, normal,Extremity Movements Upper (arms, wrists, hands, fingers): None, normal Lower (legs, knees, ankles, toes): None, normal, Trunk Movements Neck, shoulders, hips: None, normal, Overall Severity Severity of abnormal movements (highest score from questions above): None, normal Incapacitation due to abnormal movements: None, normal Patient's awareness of abnormal movements (rate only patient's report): No Awareness, Dental Status Current problems with teeth and/or dentures?: No Does patient usually wear dentures?: No  CIWA:  CIWA-Ar Total: 0 COWS:  COWS Total Score: 1  Musculoskeletal: Strength & Muscle Tone: within normal  limits Gait & Station: normal Patient leans: N/A  Psychiatric Specialty Exam: Physical Exam  ROS  Blood pressure (!) 127/103, pulse 98, temperature 98.6 F (37 C), temperature source Oral, resp. rate 18, height 5\' 5"  (1.651 m), weight 119.7 kg,  last menstrual period 10/29/2011, SpO2 98 %.Body mass index is 43.93 kg/m.  General Appearance: Casual  Eye Contact:  Fair  Speech:  Clear and Coherent  Volume:  Normal  Mood:  Anxious  Affect:  Congruent  Thought Process:  Coherent, Linear and Descriptions of Associations: Circumstantial  Orientation:  Full (Time, Place, and Person)  Thought Content:  Delusions and Paranoid Ideation  Suicidal Thoughts:  No  Homicidal Thoughts:  No  Memory:  Immediate;   Fair Recent;   Fair Remote;   Fair  Judgement:  Fair  Insight:  Shallow  Psychomotor Activity:  Normal  Concentration:  Concentration: Fair and Attention Span: Fair  Recall:  [employer] of Knowledge:  Fair  Language:  Negative for issues  Akathisia:  Negative  Handed:  Right  AIMS (if indicated):     Assets:  Communication Skills Desire for Improvement  ADL's:  Intact  Cognition:  WNL  Sleep:  Number of Hours: 10     Treatment Plan Summary: Daily contact with patient to assess and evaluate symptoms and progress in treatment and Medication management  Continue 15-minute precautions discussed case with social work probably may need some type of housing beyond her own situation but again her delusions are fixed discussed long-acting injectable basic risk-benefit side effects discussed continue current precautions  Edward Trevino, MD 07/19/2019, 8:27 AM

## 2019-07-19 NOTE — Progress Notes (Signed)
Patient ID: [name], female   DOB: January 01, 1966, 53 y.o.   MRN: [id number]   CSW met with patient to discuss her discharge plan. Patient stated that she is homeless and would like to get into the shelter at [facility] in [place], [place]. [place] contacted [facility] who stated that they are full as of today and gave the information for [facility].   CSW contacted Jackelyn Poling [phone] opt 3) at [facility]. Jackelyn Poling stated that right now the shelters are all full. She stated that CSW can call her early on the morning on discharge to see if there is any shelter availability. Jackelyn Poling stated that if not then she can give the patient a coordinator's phone number. Jackelyn Poling stated that the patient does need to have proof of a negative COVID testing.  CSW printed off additional resources and information for [facility] in [place], [place] to give to patient as a back up placement.

## 2019-07-20 MED ORDER — FLUPHENAZINE HCL 10 MG PO TABS: 20.0000 mg | ORAL_TABLET | Freq: Every day | ORAL | 2 refills | Status: DC

## 2019-07-20 MED ORDER — CARVEDILOL 12.5 MG PO TABS: 12.5000 mg | ORAL_TABLET | Freq: Two times a day (BID) | ORAL | 2 refills | Status: DC

## 2019-07-20 MED ORDER — BENZTROPINE MESYLATE 1 MG PO TABS: 1.0000 mg | ORAL_TABLET | Freq: Two times a day (BID) | ORAL | 2 refills | Status: DC

## 2019-07-20 MED ORDER — GABAPENTIN 300 MG PO CAPS: 300.0000 mg | ORAL_CAPSULE | Freq: Three times a day (TID) | ORAL | 2 refills | Status: DC

## 2019-07-20 NOTE — Progress Notes (Signed)
Adult Psychoeducational Group Note  Date:  07/20/2019 Time:  1:36 AM  Group Topic/Focus:  Wrap-Up Group:   The focus of this group is to help patients review their daily goal of treatment and discuss progress on daily workbooks.  Participation Level:  Did Not Attend  Participation Quality:  Did Not Attend  Affect:  Did Not Attend  Cognitive:  Did Not Attend  Insight: None  Engagement in Group:  Did Not Attend  Modes of Intervention:  Did Not Attend  Additional Comments:  Pt did not attend evening wrap up group.  Candy Sledge 07/20/2019, 1:36 AM

## 2019-07-20 NOTE — Progress Notes (Signed)
Recreation Therapy Notes  Date: 9.23.20 Time: 1000 Location: 500 Hall Day Room  Group Topic: Anger Management  Goal Area(s) Addresses:  Patient will identify triggers for anger.  Patient will identify a situation that makes them angry.  Patient will identify what other emotions comes with anger.   Intervention:  Worksheet  Activity: Intro to Anger Management.  Patients were to identify any person, situation or topic leads to their anger.  Patients then identified what actions they express when angry, problems they have encountered because of anger and identify 5 positive coping skills they could use when angry.  Education: Anger Management, Discharge Planning   Education Outcome: Acknowledges education/In group clarification offered/Needs additional education.   Clinical Observations/Feedback:  Pt did not attend group.    [name], LRT/CTRS         [name], Latoi Giraldo A 07/20/2019 11:05 AM

## 2019-07-20 NOTE — BHH Suicide Risk Assessment (Signed)
[facility] Discharge Suicide Risk Assessment   Principal Problem: Delusional disorder [facility]) Discharge Diagnoses: Principal Problem:   Delusional disorder ([place]) Active Problems:   Anxiety   Schizophrenia ([place])   MDD (major depressive disorder)   Total Time spent with patient: 45 minutes  Musculoskeletal: Strength & Muscle Tone: within normal limits Gait & Station: normal Patient leans: N/A  Psychiatric Specialty Exam: ROS  Blood pressure 123/83, pulse 80, temperature 98.6 F (37 C), temperature source Oral, resp. rate 18, height 5\' 5"  (1.651 m), weight 119.7 kg, last menstrual period 10/29/2011, SpO2 98 %.Body mass index is 43.93 kg/m.  General Appearance: Casual  Eye Contact::  Good  Speech:  Clear and Coherent409  Volume:  Normal  Mood:  Euthymic  Affect:  Congruent  Thought Process:  Irrelevant and Descriptions of Associations: Circumstantial  Orientation:  Full (Time, Place, and Person)  Thought Content:  Paranoid Ideation and Rumination  Suicidal Thoughts:  No  Homicidal Thoughts:  No  Memory:  Immediate;   Fair Recent;   Fair Remote;   Fair  Judgement:  Fair  Insight:  Fair  Psychomotor Activity:  Normal  Concentration:  Fair  Recall:  [employer] of Knowledge:Fair  Language: Fair  Akathisia:  Negative  Handed:  Right  AIMS (if indicated):     Assets:  Communication Skills Desire for Improvement  Sleep:  Number of Hours: 5.75  Cognition: WNL  ADL's:  Intact   Mental Status Per Nursing Assessment::   On Admission:  Self-harm behaviors  Demographic Factors:  Living alone  Loss Factors: Decrease in vocational status  Historical Factors: NA  Risk Reduction Factors:   Sense of responsibility to family and Religious beliefs about death  Continued Clinical Symptoms:  Schizophrenia:   Less than 32 years old  Cognitive Features That Contribute To Risk:  Polarized thinking    Suicide Risk:  Minimal: No identifiable suicidal ideation.  Patients presenting  with no risk factors but with morbid ruminations; may be classified as minimal risk based on the severity of the depressive symptoms  Follow-up [facility] Follow up.   Specialty: Professional Counselor Why: Please follow up for services during clinics walk-in hours Monday-Friday 8:30a-12:00p and 2:00p-3:30p.  Be sure to bring your photo ID, insurance card, and current medications.  Contact information: [facility] [place] [address] [phone]           Plan Of Care/Follow-up recommendations:  Activity:  full  Roselin Wiemann, MD 07/20/2019, 7:48 AM

## 2019-07-20 NOTE — Progress Notes (Signed)
Patient ID: [name], female   DOB: July 24, 1966, 53 y.o.   MRN: [id number]   CSW scheduled a lyft ride for patient. Patient was not in the lobby for the first 2 rides. The 3rd ride that was scheduled. Patient refused to get into the car and stated, "I cannot ride with an African American". CSW talked with patient via phone and discussed the protocol for lyft. CSW scheduled another lyft for patient.

## 2019-07-20 NOTE — Progress Notes (Signed)
Patient ID: [name], female   DOB: 03/06/1966, 53 y.o.   MRN: [id number]   CSW schedule [employer] lyft for 12pm and notified the pt's nurse.

## 2019-07-20 NOTE — Progress Notes (Signed)
Patient ID: [name], female   DOB: 04/25/1966, 53 y.o.   MRN: [id number]   D: Pt alert and oriented on the unit.   A: Education, support, and encouragement provided. Discharge summary, medications and follow up appointments reviewed with pt. Suicide prevention resources provided, including "My 3 App." Pt's belongings in locker # 20 returned and belongings sheet signed.  R: Pt denies SI/HI, A/VH, pain, or any concerns at this time. Pt ambulatory on and off unit. Pt discharged to lobby.
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
## 2019-07-20 NOTE — Progress Notes (Signed)
D: Pt denies SI/HI/AVH. Pt is pleasant and cooperative. Pt continued to express desire to D/C. Pt stated she still did not know where she was going due to  Shelters being full.  A: Pt was offered support and encouragement. Pt was given scheduled medications. Pt was encourage to attend groups. Q 15 minute checks were done for safety.  R:.Pt receptive to treatment and safety maintained on unit.  Problem: Activity: Goal: Interest or engagement in activities will improve Outcome: Not Progressing   Problem: Activity: Goal: Sleeping patterns will improve Outcome: Progressing   Problem: Coping: Goal: Ability to verbalize frustrations and anger appropriately will improve Outcome: Progressing   Problem: Coping: Goal: Ability to demonstrate self-control will improve Outcome: Progressing

## 2019-07-20 NOTE — Progress Notes (Signed)
  [facility] Adult Case Management Discharge Plan :  Will you be returning to the same living situation after discharge:  No. pt was referred to [place]  At discharge, do you have transportation home?: Yes,  pt has a lyft Do you have the ability to pay for your medications: Yes,  Medicare  Release of information consent forms completed and in the chart;  Patient's signature needed at discharge.  Patient to Follow up at: Follow-up Information    [facility] Follow up.   Specialty: Behavioral Health Why: Please follow up with clinic during walking hours for outpatient services; Monday-Friday 8:30a.-5:00p.  Please bring your photo ID, insurance card, SSN, and current medications.  Contact information: [place] [phone]          Next level of care provider has access to [place] and Suicide Prevention discussed: Yes,  with pt  Have you used any form of tobacco in the last 30 days? (Cigarettes, Smokeless Tobacco, Cigars, and/or Pipes): No  Has patient been referred to the Quitline?: N/A patient is not a smoker  Patient has been referred for addiction treatment: Yes  Billey Chang, Student-Social Work 07/20/2019, 10:41 AM

## 2019-07-20 NOTE — Progress Notes (Signed)
Patient ID: [name], female   DOB: 03/17/1966, 53 y.o.   MRN: [id number]  [place] NOVEL CORONAVIRUS (COVID-19) DAILY CHECK-OFF SYMPTOMS - answer yes or no to each - every day NO YES  Have you had a fever in the past 24 hours?  . Fever (Temp > 37.80C / 100F) X   Have you had any of these symptoms in the past 24 hours? . New Cough .  Sore Throat  .  Shortness of Breath .  Difficulty Breathing .  Unexplained Body Aches   X   Have you had any one of these symptoms in the past 24 hours not related to allergies?   . Runny Nose .  Nasal Congestion .  Sneezing   X   If you have had runny nose, nasal congestion, sneezing in the past 24 hours, has it worsened?  X   EXPOSURES - check yes or no X   Have you traveled outside the state in the past 14 days?  X   Have you been in contact with someone with a confirmed diagnosis of COVID-19 or PUI in the past 14 days without wearing appropriate PPE?  X   Have you been living in the same home as a person with confirmed diagnosis of COVID-19 or a PUI (household contact)?    X   Have you been diagnosed with COVID-19?    X              What to do next: Answered NO to all: Answered YES to anything:   Proceed with unit schedule Follow the [facility] Inpatient Flowsheet.   

## 2019-07-21 DIAGNOSIS — F250 Schizoaffective disorder, bipolar type: Secondary | ICD-10-CM | POA: Diagnosis present

## 2019-07-21 DIAGNOSIS — Z590 Homelessness: Secondary | ICD-10-CM | POA: Diagnosis not present

## 2019-07-21 DIAGNOSIS — F22 Delusional disorders: Secondary | ICD-10-CM | POA: Diagnosis not present

## 2019-07-21 DIAGNOSIS — Z20828 Contact with and (suspected) exposure to other viral communicable diseases: Secondary | ICD-10-CM | POA: Diagnosis present

## 2019-07-28 ENCOUNTER — Telehealth: Payer: Self-pay | Admitting: Neurology

## 2019-07-28 NOTE — Telephone Encounter (Signed)
Patient is calling in that she is in a shelter in [place] at this time and wants this in her file. She is needing to find a pharm there and get the gabapentin. Just wanted yall aware. Thanks!

## 2019-07-28 NOTE — Telephone Encounter (Signed)
Left message for patient that when she finds a pharmacy and wants medication transferred to let us know. In chart does not appear Dr. Delice Lesch prescribes the gabapentin (last script last week was Dr. Jake Samples). Informed patient of that on machine also that may want to contact that provider. Told her if she has other questions or concerns to call us back.

## 2019-07-29 ENCOUNTER — Ambulatory Visit (HOSPITAL_COMMUNITY)
Admission: RE | Admit: 2019-07-29 | Discharge: 2019-07-29 | Disposition: A | Discharge status: Other Acute Inpatient Hospital | Payer: Medicare Other | Attending: Psychiatry | Admitting: Psychiatry

## 2019-07-29 DIAGNOSIS — I10 Essential (primary) hypertension: Secondary | ICD-10-CM | POA: Insufficient documentation

## 2019-07-29 DIAGNOSIS — F329 Major depressive disorder, single episode, unspecified: Secondary | ICD-10-CM | POA: Insufficient documentation

## 2019-07-29 DIAGNOSIS — F22 Delusional disorders: Secondary | ICD-10-CM | POA: Diagnosis not present

## 2019-07-29 DIAGNOSIS — F419 Anxiety disorder, unspecified: Secondary | ICD-10-CM | POA: Insufficient documentation

## 2019-07-29 DIAGNOSIS — Z590 Homelessness: Secondary | ICD-10-CM | POA: Diagnosis not present

## 2019-07-29 DIAGNOSIS — E118 Type 2 diabetes mellitus with unspecified complications: Secondary | ICD-10-CM | POA: Diagnosis not present

## 2019-07-30 ENCOUNTER — Encounter (HOSPITAL_COMMUNITY): Payer: Self-pay

## 2019-07-30 ENCOUNTER — Other Ambulatory Visit: Payer: Self-pay

## 2019-07-30 ENCOUNTER — Observation Stay (HOSPITAL_COMMUNITY)
Admission: AD | Admit: 2019-07-30 | Discharge: 2019-08-01 | Disposition: A | Discharge status: Home / Self Care | Payer: Medicare Other | Attending: Psychiatry | Admitting: Psychiatry

## 2019-07-30 DIAGNOSIS — E119 Type 2 diabetes mellitus without complications: Secondary | ICD-10-CM | POA: Diagnosis not present

## 2019-07-30 DIAGNOSIS — F329 Major depressive disorder, single episode, unspecified: Secondary | ICD-10-CM

## 2019-07-30 DIAGNOSIS — R45851 Suicidal ideations: Secondary | ICD-10-CM | POA: Diagnosis not present

## 2019-07-30 DIAGNOSIS — F22 Delusional disorders: Secondary | ICD-10-CM | POA: Diagnosis not present

## 2019-07-30 DIAGNOSIS — Z833 Family history of diabetes mellitus: Secondary | ICD-10-CM | POA: Diagnosis not present

## 2019-07-30 DIAGNOSIS — Z590 Homelessness: Secondary | ICD-10-CM | POA: Insufficient documentation

## 2019-07-30 DIAGNOSIS — I10 Essential (primary) hypertension: Secondary | ICD-10-CM | POA: Diagnosis not present

## 2019-07-30 DIAGNOSIS — Z79899 Other long term (current) drug therapy: Secondary | ICD-10-CM | POA: Diagnosis not present

## 2019-07-30 DIAGNOSIS — Z560 Unemployment, unspecified: Secondary | ICD-10-CM | POA: Diagnosis not present

## 2019-07-30 DIAGNOSIS — F209 Schizophrenia, unspecified: Secondary | ICD-10-CM | POA: Diagnosis not present

## 2019-07-30 DIAGNOSIS — F200 Paranoid schizophrenia: Principal | ICD-10-CM | POA: Insufficient documentation

## 2019-07-30 DIAGNOSIS — Z20828 Contact with and (suspected) exposure to other viral communicable diseases: Secondary | ICD-10-CM | POA: Insufficient documentation

## 2019-07-30 DIAGNOSIS — Z8249 Family history of ischemic heart disease and other diseases of the circulatory system: Secondary | ICD-10-CM | POA: Diagnosis not present

## 2019-07-30 LAB — SARS CORONAVIRUS 2 BY RT PCR (HOSPITAL ORDER, PERFORMED IN ~~LOC~~ HOSPITAL LAB): SARS Coronavirus 2: NEGATIVE

## 2019-07-30 MED ORDER — ALUM & MAG HYDROXIDE-SIMETH 200-200-20 MG/5ML PO SUSP: 30.0000 mL | ORAL | Status: DC | PRN

## 2019-07-30 MED ORDER — CARVEDILOL 12.5 MG PO TABS
12.5000 mg | ORAL_TABLET | Freq: Two times a day (BID) | ORAL | Status: DC
  Administered 2019-07-30 – 2019-08-01 (×5): 12.5 mg via ORAL
  Filled 2019-07-30 (×5): qty 1

## 2019-07-30 MED ORDER — ACETAMINOPHEN 325 MG PO TABS: 650.0000 mg | ORAL_TABLET | Freq: Four times a day (QID) | ORAL | Status: DC | PRN

## 2019-07-30 MED ORDER — GABAPENTIN 300 MG PO CAPS
300.0000 mg | ORAL_CAPSULE | Freq: Three times a day (TID) | ORAL | Status: DC
  Administered 2019-07-30 – 2019-08-01 (×6): 300 mg via ORAL
  Filled 2019-07-30 (×6): qty 1

## 2019-07-30 MED ORDER — MECLIZINE HCL 12.5 MG PO TABS
12.5000 mg | ORAL_TABLET | Freq: Three times a day (TID) | ORAL | Status: DC | PRN
  Filled 2019-07-30: qty 1

## 2019-07-30 MED ORDER — BENZTROPINE MESYLATE 1 MG PO TABS
1.0000 mg | ORAL_TABLET | Freq: Two times a day (BID) | ORAL | Status: DC
  Administered 2019-07-30 – 2019-08-01 (×5): 1 mg via ORAL
  Filled 2019-07-30 (×5): qty 1

## 2019-07-30 MED ORDER — MAGNESIUM HYDROXIDE 400 MG/5ML PO SUSP: 30.0000 mL | Freq: Every day | ORAL | Status: DC | PRN

## 2019-07-30 MED ORDER — FLUPHENAZINE HCL 5 MG PO TABS
20.0000 mg | ORAL_TABLET | Freq: Every day | ORAL | Status: DC
  Administered 2019-07-30 – 2019-07-31 (×2): 20 mg via ORAL
  Filled 2019-07-30 (×2): qty 4
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
## 2019-07-30 NOTE — Progress Notes (Signed)
Pt asleep at this time. A & O X3. Denies SI, HI, AVH and pain when assessed; continues to emphasize need to speak to a CSW about "a disability lawyer for my check, It's not enough to get an apartment, my family will not take me in and I have done everything I can do, I need help". Complaint with medications as scheduled when offered. Denies adverse drug reactions or concerns when assessed.Tolerates all PO intake well. Vitals stable and documented. Emotional support and encouragement provided to pt throughout this shift. Safety checks maintained at Q 15 minutes intervals without self harm gestures or outburst to note at this time.

## 2019-07-30 NOTE — Plan of Care (Signed)
[place] Observation Crisis Plan  Reason for Crisis Plan:  Crisis Stabilization   Plan of Care:  Referral for IOP  Family Support:      Current Living Environment:  Living Arrangements: Other (Comment)(just kicked out of [facility])  Insurance:   Hospital Account    Name Acct ID Class Status Primary Coverage   [name], [name] [id number] [place] - MEDICARE PART A AND B        Guarantor Account (for [place] [id number])    Name Relation to [place]? Acct Type   [name] Self [facility] Yes Behavioral Health   Address Phone       [place], [place] [address] [phone]          Coverage Information (for Hospital Account [id number])    F/O Payor/Plan Precert #   MEDICARE/MEDICARE PART A AND B    Subscriber Subscriber #   [name], [name] [id number]   Address Phone   [address] [place], [place] [address]       Legal Guardian:     Primary Care Provider:  Patient, No Pcp Per  Current Outpatient Providers:  [facility]  Psychiatrist:     Counselor/Therapist:     Compliant with Medications:  Yes  Additional Information:   [name] 10/3/20202:26 AM

## 2019-07-30 NOTE — Progress Notes (Signed)
Patient ID: [name], female   DOB: 07-01-1966, 53 y.o.   MRN: [id number] Pt A&O x 4, no distress noted, pt sleeping at present,  Remains Delusional.  Monitoring for safety.  Denies SI, HI or AVH.
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
## 2019-07-30 NOTE — Progress Notes (Signed)
[place] NOVEL CORONAVIRUS (COVID-19) DAILY CHECK-OFF SYMPTOMS - answer yes or no to each - every day NO YES  Have you had a fever in the past 24 hours?  . Fever (Temp > 37.80C / 100F) X   Have you had any of these symptoms in the past 24 hours? . New Cough .  Sore Throat  .  Shortness of Breath .  Difficulty Breathing .  Unexplained Body Aches   X   Have you had any one of these symptoms in the past 24 hours not related to allergies?   . Runny Nose .  Nasal Congestion .  Sneezing   X   If you have had runny nose, nasal congestion, sneezing in the past 24 hours, has it worsened?  X   EXPOSURES - check yes or no X   Have you traveled outside the state in the past 14 days?  X   Have you been in contact with someone with a confirmed diagnosis of COVID-19 or PUI in the past 14 days without wearing appropriate PPE?  X   Have you been living in the same home as a person with confirmed diagnosis of COVID-19 or a PUI (household contact)?    X   Have you been diagnosed with COVID-19?    X              What to do next: Answered NO to all: Answered YES to anything:   Proceed with unit schedule Follow the [facility] Inpatient Flowsheet.   
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
## 2019-07-30 NOTE — Progress Notes (Addendum)
[facility] MD Progress Note  07/30/2019 12:35 PM [name]  MRN:  [id number] Subjective:   Principal Problem: Delusional disorder ([place]) Diagnosis: Principal Problem:   Delusional disorder ([place]) Active Problems:   Schizophrenia ([place])  Total Time spent with patient: 30 minutes  Past Psychiatric History:  Delusional disorder, Schizophrenia, Suicidal ideations  Past Medical History:  Past Medical History:  Diagnosis Date  . Anxiety   . Delusional disorder ([place])   . Diabetes mellitus without complication ([place])   . Homelessness   . Hypertension   . Major depressive disorder, recurrent, severe with psychotic features ([place])   . Overdose   . Paranoid schizophrenia ([place])   . Schizophrenia ([place])   . Tremors of nervous system    History reviewed. No pertinent surgical history. Family History:  Family History  Problem Relation Age of Onset  . Hypertension Mother   . Diabetes Mother   . Aneurysm Mother   . Hypertension Father   . Diabetes Father   . Hypertension Sister   . Diabetes Sister   . Hypertension Other   . Diabetes Other    Family Psychiatric  History: unknown Social History:  Social History   Substance and Sexual Activity  Alcohol Use No     Social History   Substance and Sexual Activity  Drug Use No    Social History   Socioeconomic History  . Marital status: Single    Spouse name: Not on file  . Number of children: Not on file  . Years of education: Not on file  . Highest education level: Not on file  Occupational History  . Not on file  Social Needs  . Financial resource strain: Not on file  . Food insecurity    Worry: Not on file    Inability: Not on file  . Transportation needs    Medical: Not on file    Non-medical: Not on file  Tobacco Use  . Smoking status: Never Smoker  . Smokeless tobacco: Never Used  Substance and Sexual Activity  . Alcohol use: No  . Drug use: No  . Sexual activity: Never  Lifestyle  . Physical activity    Days per  week: Not on file    Minutes per session: Not on file  . Stress: Not on file  Relationships  . Social Herbalist on phone: Not on file    Gets together: Not on file    Attends religious service: Not on file    Active member of club or organization: Not on file    Attends meetings of clubs or organizations: Not on file    Relationship status: Not on file  Other Topics Concern  . Not on file  Social History Narrative   Lives alone in an apartment. Has steps up to apartment      Right handed      Highest level of edu- Some college      Unemployed   Additional Social History:                         Sleep: Fair  Appetite:  Good  Current Medications: Current Facility-Administered Medications  Medication Dose Route Frequency Provider Last Rate Last Dose  . acetaminophen (TYLENOL) tablet 650 mg  650 mg Oral Q6H PRN Lindon Romp A, NP      . alum & mag hydroxide-simeth (MAALOX/MYLANTA) 200-200-20 MG/5ML suspension 30 mL  30 mL Oral Q4H PRN Rozetta Nunnery,  NP      . benztropine (COGENTIN) tablet 1 mg  1 mg Oral BID Lindon Romp A, NP   1 mg at 07/30/19 0848  . carvedilol (COREG) tablet 12.5 mg  12.5 mg Oral BID WC Lindon Romp A, NP   12.5 mg at 07/30/19 0848  . fluPHENAZine (PROLIXIN) tablet 20 mg  20 mg Oral QHS Lindon Romp A, NP      . gabapentin (NEURONTIN) capsule 300 mg  300 mg Oral TID Lindon Romp A, NP   300 mg at 07/30/19 0848  . magnesium hydroxide (MILK OF MAGNESIA) suspension 30 mL  30 mL Oral Daily PRN Lindon Romp A, NP      . meclizine (ANTIVERT) tablet 12.5 mg  12.5 mg Oral TID PRN Rozetta Nunnery, NP        Lab Results:  Results for orders placed or performed during the hospital encounter of 07/30/19 (from the past 48 hour(s))  SARS Coronavirus 2 [facility] order, Performed in [facility] hospital lab) Nasopharyngeal Nasopharyngeal Swab     Status: None   Collection Time: 07/30/19  1:04 AM   Specimen: Nasopharyngeal Swab  Result Value Ref  Range   SARS Coronavirus 2 NEGATIVE NEGATIVE    Comment: (NOTE) If result is NEGATIVE SARS-CoV-2 target nucleic acids are NOT DETECTED. The SARS-CoV-2 RNA is generally detectable in upper and lower  respiratory specimens during the acute phase of infection. The lowest  concentration of SARS-CoV-2 viral copies this assay can detect is 250  copies / mL. A negative result does not preclude SARS-CoV-2 infection  and should not be used as the sole basis for treatment or other  patient management decisions.  A negative result may occur with  improper specimen collection / handling, submission of specimen other  than nasopharyngeal swab, presence of viral mutation(s) within the  areas targeted by this assay, and inadequate number of viral copies  (<250 copies / mL). A negative result must be combined with clinical  observations, patient history, and epidemiological information. If result is POSITIVE SARS-CoV-2 target nucleic acids are DETECTED. The SARS-CoV-2 RNA is generally detectable in upper and lower  respiratory specimens dur ing the acute phase of infection.  Positive  results are indicative of active infection with SARS-CoV-2.  Clinical  correlation with patient history and other diagnostic information is  necessary to determine patient infection status.  Positive results do  not rule out bacterial infection or co-infection with other viruses. If result is PRESUMPTIVE POSTIVE SARS-CoV-2 nucleic acids MAY BE PRESENT.   A presumptive positive result was obtained on the submitted specimen  and confirmed on repeat testing.  While 2019 novel coronavirus  (SARS-CoV-2) nucleic acids may be present in the submitted sample  additional confirmatory testing may be necessary for epidemiological  and / or clinical management purposes  to differentiate between  SARS-CoV-2 and other Sarbecovirus currently known to infect humans.  If clinically indicated additional testing with an alternate test   methodology [id number]) is advised. The SARS-CoV-2 RNA is generally  detectable in upper and lower respiratory sp ecimens during the acute  phase of infection. The expected result is Negative. Fact Sheet for Patients:  StrictlyIdeas.no Fact Sheet for Healthcare Providers: BankingDealers.co.za This test is not yet approved or cleared by the [place] FDA and has been authorized for detection and/or diagnosis of SARS-CoV-2 by FDA under an Emergency Use Authorization (EUA).  This EUA will remain in effect (meaning this test can be used) for the duration of  the COVID-19 declaration under Section 564(b)(1) of the Act, 21 U.S.C. section 360bbb-3(b)(1), unless the authorization is terminated or revoked sooner. Performed at [facility], [place] [address], [place], [place] [address]     Blood Alcohol level:  Lab Results  Component Value Date   ETH <10 07/13/2019   ETH <10 99991111    Metabolic Disorder Labs: Lab Results  Component Value Date   HGBA1C 6.6 (H) 06/05/2019   MPG 142.72 06/05/2019   MPG 148.46 02/23/2019   No results found for: PROLACTIN Lab Results  Component Value Date   CHOL 220 (H) 06/05/2019   TRIG 210 (H) 06/05/2019   HDL 39 (L) 06/05/2019   CHOLHDL 5.6 06/05/2019   VLDL 42 (H) 06/05/2019   LDLCALC 139 (H) 06/05/2019   LDLCALC 133 (H) 02/23/2019    Physical Findings: AIMS: Facial and Oral Movements Muscles of Facial Expression: None, normal Lips and Perioral Area: None, normal Jaw: None, normal Tongue: None, normal,Extremity Movements Upper (arms, wrists, hands, fingers): None, normal Lower (legs, knees, ankles, toes): None, normal, Trunk Movements Neck, shoulders, hips: None, normal, Overall Severity Severity of abnormal movements (highest score from questions above): None, normal Incapacitation due to abnormal movements: None, normal Patient's awareness of abnormal movements (rate  only patient's report): No Awareness, Dental Status Current problems with teeth and/or dentures?: No Does patient usually wear dentures?: No  CIWA:    COWS:     Musculoskeletal: Strength & Muscle Tone: within normal limits Gait & Station: patient sitting during interview Patient leans: did not assess  Psychiatric Specialty Exam: Physical Exam  Constitutional: She is oriented to person, place, and time. She appears well-developed.  Eyes: Pupils are equal, round, and reactive to light.  Neck: Normal range of motion.  Respiratory: Effort normal.  Musculoskeletal: Normal range of motion.  Neurological: She is alert and oriented to person, place, and time.    Review of Systems  Psychiatric/Behavioral: Positive for depression. Negative for substance abuse.    Blood pressure 138/64, pulse 77, temperature 98.3 F (36.8 C), temperature source Oral, resp. rate 20, last menstrual period 10/29/2011, SpO2 100 %.There is no height or weight on file to calculate BMI.  General Appearance: Disheveled  Eye Contact:  Fair  Speech:  Clear and Coherent and talkative  Volume:  Normal  Mood:  Depressed and Dysphoric  Affect:  Congruent  Thought Process:  Descriptions of Associations: Circumstantial  Orientation:  Full (Time, Place, and Person)  Thought Content:  Delusions, Ideas of Reference:   Delusions and Rumination  Suicidal Thoughts:  No  Homicidal Thoughts:  No  Memory:  unable to assess  Judgement:  Impaired  Insight:  Lacking  Psychomotor Activity:  Normal  Concentration:  Concentration: Poor and Attention Span: Poor  Recall:  unable to assess  Fund of Knowledge:  Fair  Language:  Fair  Akathisia:  Negative  Handed:  Right  AIMS (if indicated):     Assets:  Communication Skills Desire for Improvement  ADL's:  Impaired  Cognition:  Impaired,  Mild  Sleep:   <6 hours    Treatment Plan Summary:53 year old [place] American female with remarkable psychotic history for Schizophrenia,  Delusional disorder, Paranoia and suicidal ideations.   Has not been taking her mental health medications and has increased delusional behaviors, impaired judgement and lack of insight contributing to safety concerns.  These concerns are exacerbated by her homelessness, lack of social support system and decreased protective factors.    Recommend 24 hours  OBS to restart her medications for stabilization.     Continue medications: Delusional disorder: Fluphenazine 20mg  po hs  EPS: Benztropine 1mg  po two times daily   Alcohol Withdrawal: Gabapentin 300mg  po three times daily   Observation Level/Precautions:  15 minute checks Laboratory:  UDS Psychotherapy:  Individual  Medications:  Resumed home medications   Consultations:  Social Work Discharge Concerns:  Safety, housing Estimated LOS: 24 hour OBS Other:     Mallie Darting, NP 07/30/2019, 12:35 PM

## 2019-07-30 NOTE — H&P (Signed)
Behavioral Health Medical Screening Exam  [name] is an 53 y.o. female.  Psychiatric Specialty Exam: Physical Exam  Constitutional: She is oriented to person, place, and time. She appears well-developed and well-nourished. No distress.  HENT:  Head: Normocephalic and atraumatic.  Right Ear: External ear normal.  Left Ear: External ear normal.  Eyes: Pupils are equal, round, and reactive to light. Right eye exhibits no discharge. Left eye exhibits no discharge.  Respiratory: Effort normal. No respiratory distress.  Musculoskeletal: Normal range of motion.  Neurological: She is alert and oriented to person, place, and time.  Skin: She is not diaphoretic.  Psychiatric: Her mood appears anxious. Her speech is tangential. She is not withdrawn and not actively hallucinating. Thought content is delusional. She exhibits a depressed mood. She expresses suicidal ideation. She expresses no homicidal ideation. She expresses no suicidal plans.    Review of Systems  Constitutional: Negative for chills, diaphoresis, fever, malaise/fatigue and weight loss.  Respiratory: Negative for cough and shortness of breath.   Cardiovascular: Negative for chest pain.  Gastrointestinal: Negative for diarrhea, nausea and vomiting.  Psychiatric/Behavioral: Positive for depression and suicidal ideas. Negative for hallucinations and memory loss. The patient is nervous/anxious and has insomnia.     Last menstrual period 10/29/2011.There is no height or weight on file to calculate BMI.  General Appearance: Casual and Fairly Groomed  Eye Contact:  Fair  Speech:  Clear and Coherent and Slightly pressured  Volume:  Normal  Mood:  Anxious, Depressed, Hopeless and Worthless  Affect:  Congruent  Thought Process:  Disorganized and Descriptions of Associations: Loose  Orientation:  Full (Time, Place, and Person)  Thought Content:  Tangential  Suicidal Thoughts:  Yes.  without intent/plan  Homicidal Thoughts:  No   Memory:  Immediate;   Good Recent;   Good  Judgement:  Intact  Insight:  Lacking  Psychomotor Activity:  Normal  Concentration:  Concentration: Fair and Attention Span: Fair  Recall:  Good  Fund of Knowledge:  Good  Language:  Good  Akathisia:  Negative  Handed:  Right  AIMS (if indicated):     Assets:  Communication Skills Desire for Improvement Leisure Time Physical Health Transportation  ADL's:  Intact  Cognition:  WNL  Sleep:           Recommendations:  Based on my evaluation the patient does not appear to have an emergency medical condition.  Rozetta Nunnery, NP 07/30/2019, 1:22 AM

## 2019-07-31 DIAGNOSIS — Z590 Homelessness: Secondary | ICD-10-CM | POA: Diagnosis not present

## 2019-07-31 DIAGNOSIS — F200 Paranoid schizophrenia: Secondary | ICD-10-CM | POA: Diagnosis not present

## 2019-07-31 DIAGNOSIS — Z560 Unemployment, unspecified: Secondary | ICD-10-CM | POA: Diagnosis not present

## 2019-07-31 DIAGNOSIS — R45851 Suicidal ideations: Secondary | ICD-10-CM | POA: Diagnosis not present

## 2019-07-31 DIAGNOSIS — E119 Type 2 diabetes mellitus without complications: Secondary | ICD-10-CM | POA: Diagnosis not present

## 2019-07-31 DIAGNOSIS — Z20828 Contact with and (suspected) exposure to other viral communicable diseases: Secondary | ICD-10-CM | POA: Diagnosis not present

## 2019-07-31 NOTE — Progress Notes (Signed)
7a-7p Shift:  D:  Pt has been labile, tearful, and frustrated that her family is not helping her.  Pt's main concern is that she is homeless and was kicked out of her shelter.  Pt states that she had called the shelter today and was told that she could come back as long as she brought negative covid results with her.   A:  Support, education, and encouragement provided as appropriate to situation.  Medications administered per MD order.  Level 3 checks continued for safety.   R:  Pt receptive to measures; Safety maintained.

## 2019-07-31 NOTE — Progress Notes (Signed)
Patient ID: [name], female   DOB: [birth year], 53 y.o.   MRN: [id number] Pt A&O x 4, no distress noted, calm & cooperative, resting at present.  Denies SI, HI.  Monitoring for safety.

## 2019-08-01 ENCOUNTER — Encounter (HOSPITAL_COMMUNITY): Payer: Self-pay | Admitting: Registered Nurse

## 2019-08-01 DIAGNOSIS — Z560 Unemployment, unspecified: Secondary | ICD-10-CM | POA: Diagnosis not present

## 2019-08-01 DIAGNOSIS — E119 Type 2 diabetes mellitus without complications: Secondary | ICD-10-CM | POA: Diagnosis not present

## 2019-08-01 DIAGNOSIS — Z20828 Contact with and (suspected) exposure to other viral communicable diseases: Secondary | ICD-10-CM | POA: Diagnosis not present

## 2019-08-01 DIAGNOSIS — Z590 Homelessness: Secondary | ICD-10-CM | POA: Diagnosis not present

## 2019-08-01 DIAGNOSIS — F200 Paranoid schizophrenia: Secondary | ICD-10-CM | POA: Diagnosis not present

## 2019-08-01 DIAGNOSIS — R45851 Suicidal ideations: Secondary | ICD-10-CM | POA: Diagnosis not present

## 2019-08-01 NOTE — Progress Notes (Signed)
CSW met with pt and discussed her discharge plan. She is planning to drive back to the [facility] today and is in need of gas money. CSW spoke with social work team lead and pt was approved for monetary assistance. Money was given to RN who will give to pt upon discharge.    , LCSW, LCAS Disposition CSW [facility] [phone]  

## 2019-08-01 NOTE — Progress Notes (Signed)
D: Pt A & O X 4. Denies SI, HI, AVH and pain at this time. D/C home as ordered. Per pt "I'm going to [facility]". Pt given $10.00 from disposition CSW for gas money.  A: D/C instructions reviewed with pt including prescriptions, medication samples and follow up appointment, compliance encouraged. All belongings from assigned locker was returned to pt at time of departure. Scheduled medications given with verbal education and effects monitored. Safety checks maintained without incident till time of d/c.  R: Pt receptive to care. Compliant with medications when offered. Denies adverse drug reactions when assessed. Verbalized understanding related to d/c instructions. Signed belonging sheet in agreement with items received from locker. Ambulatory with a steady gait. Appears to be in no physical distress at time of departure.

## 2019-08-01 NOTE — Discharge Summary (Addendum)
[facility] Psych Observation Discharge  08/01/2019 10:11 AM [name]  MRN:  [id number] Principal Problem: Delusional disorder [facility]) Discharge Diagnoses: Delusional Disorder  Subjective: Pt presented to [facility] as a walk-in on 07/30/2019 after being asked to leave from [facility]. She stated that she had no place to go to so she came here. She has hx of fixed persecutory delusion against [employer] regarding an incident from 2006. Today, upon assessment, pt stated that she came in as she was stressed due to being homeless. However, that has changed as she called [facility] last night  And they said they will have a bed for her today at 5 pm as long as she can bring in documentation stating that her COVID test is negative. She denied any hallucinations. She was evasive about any paranoid delusions. She denied any suicidal ideations or intent. She denied any homicidal ideations. She reported that she slept well last night and feels well-rested.  She stated that she did not continue taking Fluphenazine after her discharge and has only continued to take Gabapentin. She stated that she has no intent of taking any other medication other than Gabapentin. She intends to continue taking Gabapentin after leaving here and denied any need for prescriptions.  She requested for assistance in getting gas for her vehicle as she wants to drive to [place] after her discharge.   Total Time spent with patient: 30 minutes  Past Psychiatric History: History of prior psychiatric admissions recent one being in September 2020. She has been diagnosed with delusional disorder in the past.   Past Medical History:    History reviewed. No pertinent surgical history. Family History:  Family History  Problem Relation Age of Onset  . Hypertension Mother   . Diabetes Mother   . Aneurysm Mother   . Hypertension Father   . Diabetes Father   . Hypertension Sister   . Diabetes Sister   . Hypertension Other    . Diabetes Other    Family Psychiatric  History: refused to elaborate Social History:  Social History   Substance and Sexual Activity  Alcohol Use No     Social History   Substance and Sexual Activity  Drug Use No    Social History   Socioeconomic History  . Marital status: Single    Spouse name: Not on file  . Number of children: Not on file  . Years of education: Not on file  . Highest education level: Not on file  Occupational History  . Not on file  Social Needs  . Financial resource strain: Not on file  . Food insecurity    Worry: Not on file    Inability: Not on file  . Transportation needs    Medical: Not on file    Non-medical: Not on file  Tobacco Use  . Smoking status: Never Smoker  . Smokeless tobacco: Never Used  Substance and Sexual Activity  . Alcohol use: No  . Drug use: No  . Sexual activity: Never  Lifestyle  . Physical activity    Days per week: Not on file    Minutes per session: Not on file  . Stress: Not on file  Relationships  . Social Herbalist on phone: Not on file    Gets together: Not on file    Attends religious service: Not on file    Active member of club or organization: Not on file    Attends meetings of clubs or organizations: Not  on file    Relationship status: Not on file  Other Topics Concern  . Not on file  Social History Narrative   Lives alone in an apartment. Has steps up to apartment      Right handed      Highest level of edu- Some college      Unemployed    Has this patient used any form of tobacco in the last 30 days? (Cigarettes, Smokeless Tobacco, Cigars, and/or Pipes) Prescription not provided because: pt is not interested  Current Medications: Current Facility-Administered Medications  Medication Dose Route Frequency Provider Last Rate Last Dose  . acetaminophen (TYLENOL) tablet 650 mg  650 mg Oral Q6H PRN Lindon Romp A, NP      . alum & mag hydroxide-simeth (MAALOX/MYLANTA) 200-200-20  MG/5ML suspension 30 mL  30 mL Oral Q4H PRN Lindon Romp A, NP      . benztropine (COGENTIN) tablet 1 mg  1 mg Oral BID Lindon Romp A, NP   1 mg at 08/01/19 0852  . carvedilol (COREG) tablet 12.5 mg  12.5 mg Oral BID WC Lindon Romp A, NP   12.5 mg at 08/01/19 0853  . fluPHENAZine (PROLIXIN) tablet 20 mg  20 mg Oral QHS Lindon Romp A, NP   20 mg at 07/31/19 2217  . gabapentin (NEURONTIN) capsule 300 mg  300 mg Oral TID Lindon Romp A, NP   300 mg at 08/01/19 0852  . magnesium hydroxide (MILK OF MAGNESIA) suspension 30 mL  30 mL Oral Daily PRN Lindon Romp A, NP      . meclizine (ANTIVERT) tablet 12.5 mg  12.5 mg Oral TID PRN Rozetta Nunnery, NP       PTA Medications: Medications Prior to Admission  Medication Sig Dispense Refill Last Dose  . Multiple Vitamin (MULTIVITAMIN WITH MINERALS) TABS tablet Take 1 tablet by mouth daily.     Marland Kitchen acetaminophen (TYLENOL) 500 MG tablet Take 500 mg by mouth every 6 (six) hours as needed for headache.     . benztropine (COGENTIN) 1 MG tablet Take 1 tablet (1 mg total) by mouth 2 (two) times daily. (Patient not taking: Reported on 07/31/2019) 60 tablet 2 Not Taking at Unknown time  . carvedilol (COREG) 12.5 MG tablet Take 1 tablet (12.5 mg total) by mouth 2 (two) times daily with a meal. (Patient not taking: Reported on 07/31/2019) 60 tablet 2 Not Taking at Unknown time  . fluPHENAZine (PROLIXIN) 10 MG tablet Take 2 tablets (20 mg total) by mouth at bedtime. (Patient not taking: Reported on 07/31/2019) 60 tablet 2 Not Taking at Unknown time  . gabapentin (NEURONTIN) 300 MG capsule Take 1 capsule (300 mg total) by mouth 3 (three) times daily. 90 capsule 2   . meclizine (ANTIVERT) 12.5 MG tablet Take 1 tablet (12.5 mg total) by mouth 3 (three) times daily as needed for dizziness. (Patient not taking: Reported on 07/31/2019) 15 tablet 0 Not Taking at Unknown time  . ondansetron (ZOFRAN ODT) 4 MG disintegrating tablet Take 1 tablet (4 mg total) by mouth every 8 (eight)  hours as needed for nausea or vomiting. (Patient not taking: Reported on 07/14/2019) 20 tablet 0     Musculoskeletal: Strength & Muscle Tone: within normal limits Gait & Station: normal Patient leans: N/A  Psychiatric Specialty Exam: Physical Exam  Constitutional: She is oriented to person, place, and time. She appears well-developed and well-nourished.  HENT:  Head: Normocephalic and atraumatic.  Neck: Normal range of motion.  Respiratory: Effort  normal.  Musculoskeletal: Normal range of motion.  Neurological: She is alert and oriented to person, place, and time.  Skin: Skin is warm and dry.    Review of Systems  Constitutional: Negative.   HENT: Negative.   Respiratory: Negative.   Cardiovascular: Negative.   Gastrointestinal: Negative.   Musculoskeletal: Negative.   Skin: Negative.   Neurological: Negative.     Blood pressure 133/62, pulse 68, temperature 98.2 F (36.8 C), resp. rate 18, last menstrual period 10/29/2011, SpO2 100 %.There is no height or weight on file to calculate BMI.  General Appearance: Fairly Groomed, dressed appropriately  Eye Contact:  Good  Speech:  Normal Rate  Volume:  Normal  Mood:  Euthymic  Affect:  Restricted  Thought Process:  Goal Directed, description of associations: fixed persecutory delusion against [employer]  Orientation:  Full (Time, Place, and Person)  Thought Content:  Logical  Suicidal Thoughts:  No  Homicidal Thoughts:  No  Memory:  Immediate;   Good Recent;   Good Remote;   Good  Judgement:  Fair  Insight:  Fair  Psychomotor Activity:  Normal  Concentration:  Concentration: Good and Attention Span: Good  Recall:  Good  Fund of Knowledge:  Good  Language:  Good  Akathisia:  Negative  Handed:  Right  AIMS (if indicated):     Assets:  Communication Skills Desire for Improvement Transportation  ADL's:  Intact  Cognition:  WNL  Sleep:   good     Demographic Factors:  Female  Loss Factors: Financial problems/change  in socioeconomic status  Historical Factors: Homeless  Risk Reduction Factors:   Positive coping skills or problem solving skills  Continued Clinical Symptoms:  Previous Psychiatric Diagnoses and Treatments  Cognitive Features That Contribute To Risk:  Thought constriction (tunnel vision)    Suicide Risk:  Minimal: No identifiable suicidal ideation.  Patients presenting with no risk factors but with morbid ruminations; may be classified as minimal risk based on the severity of the depressive symptoms    Plan Of Care/Follow-up recommendations:  Other:  Pt is denying any active suicidal or homicidal ideations or plans.  She has a goal of getting to [facility] to alleviate her stressor of homelessness and is asking the right questions and help so that she can get there.  Disposition:  Based on her psychiatric evaluation, pt does not meet criteria for in-patient psychiatric admission. She is cleared for discharge. SW Ms. Clarise Cruz was contacted to assist patient with resources so that she can get gas for her vehicle.  N[place] Crane, MD 08/01/2019, 10:11 AM

## 2019-08-01 NOTE — Progress Notes (Signed)
[facility] NOVEL CORONAVIRUS (COVID-19) DAILY CHECK-OFF SYMPTOMS - answer yes or no to each - every day NO YES  Have you had a fever in the past 24 hours?  . Fever (Temp > 37.80C / 100F) X   Have you had any of these symptoms in the past 24 hours? . New Cough .  Sore Throat  .  Shortness of Breath .  Difficulty Breathing .  Unexplained Body Aches   X   Have you had any one of these symptoms in the past 24 hours not related to allergies?   . Runny Nose .  Nasal Congestion .  Sneezing   X   If you have had runny nose, nasal congestion, sneezing in the past 24 hours, has it worsened?  X   EXPOSURES - check yes or no X   Have you traveled outside the state in the past 14 days?  X   Have you been in contact with someone with a confirmed diagnosis of COVID-19 or PUI in the past 14 days without wearing appropriate PPE?  X   Have you been living in the same home as a person with confirmed diagnosis of COVID-19 or a PUI (household contact)?    X   Have you been diagnosed with COVID-19?    X              What to do next: Answered NO to all: Answered YES to anything:   Proceed with unit schedule Follow the [facility] Inpatient Flowsheet.   

## 2019-08-09 ENCOUNTER — Emergency Department (HOSPITAL_COMMUNITY)
Admission: EM | Admit: 2019-08-09 | Discharge: 2019-08-10 | Disposition: A | Discharge status: Home / Self Care | Payer: Medicare Other | Attending: Emergency Medicine | Admitting: Emergency Medicine

## 2019-08-09 ENCOUNTER — Encounter (HOSPITAL_COMMUNITY): Payer: Self-pay

## 2019-08-09 DIAGNOSIS — E119 Type 2 diabetes mellitus without complications: Secondary | ICD-10-CM | POA: Insufficient documentation

## 2019-08-09 DIAGNOSIS — R457 State of emotional shock and stress, unspecified: Secondary | ICD-10-CM | POA: Diagnosis not present

## 2019-08-09 DIAGNOSIS — Z79899 Other long term (current) drug therapy: Secondary | ICD-10-CM | POA: Insufficient documentation

## 2019-08-09 DIAGNOSIS — R519 Headache, unspecified: Secondary | ICD-10-CM | POA: Diagnosis not present

## 2019-08-09 DIAGNOSIS — I10 Essential (primary) hypertension: Secondary | ICD-10-CM | POA: Diagnosis not present

## 2019-08-09 DIAGNOSIS — G4489 Other headache syndrome: Secondary | ICD-10-CM | POA: Diagnosis not present

## 2019-08-09 LAB — PREGNANCY, URINE: Preg Test, Ur: NEGATIVE

## 2019-08-09 LAB — URINALYSIS, ROUTINE W REFLEX MICROSCOPIC
Bilirubin Urine: NEGATIVE
Glucose, UA: NEGATIVE mg/dL
Hgb urine dipstick: NEGATIVE
Ketones, ur: 20 mg/dL — AB
Leukocytes,Ua: NEGATIVE
Nitrite: NEGATIVE
Protein, ur: NEGATIVE mg/dL
Specific Gravity, Urine: 1.029 (ref 1.005–1.030)
pH: 5 (ref 5.0–8.0)

## 2019-08-09 MED ORDER — ACETAMINOPHEN 500 MG PO TABS
1000.0000 mg | ORAL_TABLET | Freq: Once | ORAL | Status: AC
  Administered 2019-08-09: 1000 mg via ORAL
  Filled 2019-08-09: qty 2

## 2019-08-09 MED ORDER — DIPHENHYDRAMINE HCL 25 MG PO CAPS
25.0000 mg | ORAL_CAPSULE | Freq: Once | ORAL | Status: AC
  Administered 2019-08-09: 25 mg via ORAL
  Filled 2019-08-09: qty 1

## 2019-08-09 MED ORDER — METOCLOPRAMIDE HCL 10 MG PO TABS
10.0000 mg | ORAL_TABLET | Freq: Once | ORAL | Status: AC
  Administered 2019-08-09: 10 mg via ORAL
  Filled 2019-08-09: qty 1

## 2019-08-09 MED ORDER — IBUPROFEN 800 MG PO TABS
800.0000 mg | ORAL_TABLET | Freq: Once | ORAL | Status: AC
  Administered 2019-08-09: 800 mg via ORAL
  Filled 2019-08-09: qty 1

## 2019-08-09 NOTE — ED Triage Notes (Addendum)
Pt BIBA from [place]. Pt has called 911 multiple times for someone stalking her. EMS called out for acting paranoid. Pt c/o headache as wellx 1 hour.  Pt states someone from her past is stalking her. Denies SI/Hi  Hx of paranoid schizophrenia.

## 2019-08-09 NOTE — ED Notes (Signed)
CURRENTLY USING A PHONE. GIVEN SOCKS AND WARM BLANKET AS REQUESTED.

## 2019-08-10 ENCOUNTER — Ambulatory Visit (HOSPITAL_COMMUNITY)
Admission: RE | Admit: 2019-08-10 | Discharge: 2019-08-10 | Disposition: A | Discharge status: Home / Self Care | Payer: Medicare Other | Source: Home / Self Care | Attending: Psychiatry | Admitting: Psychiatry

## 2019-08-10 ENCOUNTER — Encounter (HOSPITAL_COMMUNITY): Payer: Self-pay | Admitting: Emergency Medicine

## 2019-08-10 NOTE — ED Provider Notes (Signed)
[facility] DEPT Provider Note   CSN: [id number] Arrival date & time: 08/09/19  [facility]     History   Chief Complaint Chief Complaint  Patient presents with  . Headache    HPI [name] is a 53 y.o. female.     The history is provided by the patient.  Headache Pain location:  Generalized Quality:  Dull Radiates to:  Does not radiate Severity currently:  8/10 Severity at highest:  8/10 Onset quality:  Gradual Timing:  Constant Progression:  Unchanged Chronicity:  Recurrent Similar to prior headaches: yes   Context: not activity, not exposure to bright light, not caffeine, not coughing, not defecating, not eating, not stress, not exposure to cold air, not intercourse, not loud noise and not straining   Relieved by:  Nothing Worsened by:  Nothing Ineffective treatments:  None tried Associated symptoms: no abdominal pain, no back pain, no blurred vision, no congestion, no cough, no diarrhea, no dizziness, no drainage, no ear pain, no eye pain, no facial pain, no fatigue, no fever, no focal weakness, no hearing loss, no loss of balance, no myalgias, no nausea, no near-syncope, no neck pain, no neck stiffness, no numbness, no paresthesias, no photophobia, no seizures and no sinus pressure   Risk factors: no anger and no family hx of SAH     Past Medical History:  Diagnosis Date  . Anxiety   . Delusional disorder ([place])   . Diabetes mellitus without complication ([place])   . Homelessness   . Hypertension   . Major depressive disorder, recurrent, severe with psychotic features ([place])   . Overdose   . Paranoid schizophrenia ([place])   . Schizophrenia ([place])   . Tremors of nervous system     Patient Active Problem List   Diagnosis Date Noted  . MDD (major depressive disorder) 07/13/2019  . Major depressive disorder, recurrent episode, severe, with psychosis ([place])   . Schizophrenia ([place]) 03/26/2018  . Delusional disorder ([place]) 01/07/2018  .  Anxiety 03/29/2016  . Homelessness 11/18/2015  . Tobacco use disorder 07/05/2015  . Hypokalemia 05/30/2015  . Obstipation 05/30/2015  . Prolonged QT interval 05/30/2015  . Hypertension 05/30/2015  . Suicidal ideations 03/12/2015  . Diabetes ([place]) 09/06/2012  . Paranoia ([place]) 04/16/2012    History reviewed. No pertinent surgical history.   OB History   No obstetric history on file.      Home Medications    Prior to Admission medications   Medication Sig Start Date End Date Taking? Authorizing Provider  benztropine (COGENTIN) 1 MG tablet Take 1 tablet (1 mg total) by mouth 2 (two) times daily. Patient not taking: Reported on 07/31/2019 07/20/19   Johnn Hai, MD  carvedilol (COREG) 12.5 MG tablet Take 1 tablet (12.5 mg total) by mouth 2 (two) times daily with a meal. Patient not taking: Reported on 07/31/2019 07/20/19   Johnn Hai, MD  fluPHENAZine (PROLIXIN) 10 MG tablet Take 2 tablets (20 mg total) by mouth at bedtime. Patient not taking: Reported on 07/31/2019 07/20/19   Johnn Hai, MD  gabapentin (NEURONTIN) 300 MG capsule Take 1 capsule (300 mg total) by mouth 3 (three) times daily. 07/20/19   Johnn Hai, MD  meclizine (ANTIVERT) 12.5 MG tablet Take 1 tablet (12.5 mg total) by mouth 3 (three) times daily as needed for dizziness. Patient not taking: Reported on 07/31/2019 06/27/19   Fransico Meadow, PA-C  ondansetron (ZOFRAN ODT) 4 MG disintegrating tablet Take 1 tablet (4 mg total) by mouth every  8 (eight) hours as needed for nausea or vomiting. Patient not taking: Reported on 07/14/2019 06/21/19   Maudie Flakes, MD    Family History Family History  Problem Relation Age of Onset  . Hypertension Mother   . Diabetes Mother   . Aneurysm Mother   . Hypertension Father   . Diabetes Father   . Hypertension Sister   . Diabetes Sister   . Hypertension Other   . Diabetes Other     Social History Social History   Tobacco Use  . Smoking status: Never Smoker  . Smokeless  tobacco: Never Used  Substance Use Topics  . Alcohol use: No  . Drug use: No     Allergies   Patient has no known allergies.   Review of Systems Review of Systems  Constitutional: Negative for fatigue and fever.  HENT: Negative for congestion, ear pain, hearing loss, postnasal drip and sinus pressure.   Eyes: Negative for blurred vision, photophobia and pain.  Respiratory: Negative for cough.   Cardiovascular: Negative for near-syncope.  Gastrointestinal: Negative for abdominal pain, diarrhea and nausea.  Genitourinary: Negative for difficulty urinating.  Musculoskeletal: Negative for back pain, myalgias, neck pain and neck stiffness.  Neurological: Positive for headaches. Negative for dizziness, focal weakness, seizures, numbness, paresthesias and loss of balance.  Psychiatric/Behavioral: Negative for agitation.  All other systems reviewed and are negative.    Physical Exam Updated Vital Signs BP (!) 131/97   Pulse 84   Temp 98.9 F (37.2 C)   Resp 16   Ht 5\' 5"  (1.651 m)   Wt 117.9 kg   LMP 10/29/2011 Comment: NEG U PREG 02/24/17  SpO2 97%   BMI 43.27 kg/m   Physical Exam Vitals signs and nursing note reviewed.  Constitutional:      General: She is not in acute distress.    Appearance: She is normal weight.  HENT:     Head: Normocephalic and atraumatic.     Nose: Nose normal.  Eyes:     Conjunctiva/sclera: Conjunctivae normal.     Pupils: Pupils are equal, round, and reactive to light.  Neck:     Musculoskeletal: Normal range of motion and neck supple.  Cardiovascular:     Rate and Rhythm: Normal rate and regular rhythm.     Pulses: Normal pulses.     Heart sounds: Normal heart sounds.  Pulmonary:     Effort: Pulmonary effort is normal.     Breath sounds: Normal breath sounds.  Abdominal:     General: Abdomen is flat. Bowel sounds are normal.     Tenderness: There is no abdominal tenderness. There is no guarding or rebound.  Musculoskeletal: Normal  range of motion.  Skin:    General: Skin is warm and dry.     Capillary Refill: Capillary refill takes less than 2 seconds.  Neurological:     General: No focal deficit present.     Mental Status: She is alert and oriented to person, place, and time.     Cranial Nerves: No cranial nerve deficit.     Deep Tendon Reflexes: Reflexes normal.  Psychiatric:        Mood and Affect: Mood normal.        Behavior: Behavior normal.      ED Treatments / Results  Labs (all labs ordered are listed, but only abnormal results are displayed) Results for orders placed or performed during the hospital encounter of 08/09/19  Urinalysis, Routine w reflex microscopic  Result  Value Ref Range   Color, Urine YELLOW YELLOW   APPearance CLEAR CLEAR   Specific Gravity, Urine 1.029 1.005 - 1.030   pH 5.0 5.0 - 8.0   Glucose, UA NEGATIVE NEGATIVE mg/dL   Hgb urine dipstick NEGATIVE NEGATIVE   Bilirubin Urine NEGATIVE NEGATIVE   Ketones, ur 20 (A) NEGATIVE mg/dL   Protein, ur NEGATIVE NEGATIVE mg/dL   Nitrite NEGATIVE NEGATIVE   Leukocytes,Ua NEGATIVE NEGATIVE  Pregnancy, urine  Result Value Ref Range   Preg Test, Ur NEGATIVE NEGATIVE   No results found.  Radiology No results found.  Procedures Procedures (including critical care time)  Medications Ordered in ED Medications  acetaminophen (TYLENOL) tablet 1,000 mg (1,000 mg Oral Given 08/09/19 2333)  ibuprofen (ADVIL) tablet 800 mg (800 mg Oral Given 08/09/19 2333)  diphenhydrAMINE (BENADRYL) capsule 25 mg (25 mg Oral Given 08/09/19 2333)  metoCLOPramide (REGLAN) tablet 10 mg (10 mg Oral Given 08/09/19 2333)    Well appearing.  No SI or HI.  No signs of meningitis, ICH or cavernous sinus thrombosis. Stable for discharge with close follow up.  No UTI.  Follow up with your PMD.    [name] was evaluated in Emergency Department on 08/10/2019 for the symptoms described in the history of present illness. She was evaluated in the context of  the global COVID-19 pandemic, which necessitated consideration that the patient might be at risk for infection with the SARS-CoV-2 virus that causes COVID-19. Institutional protocols and algorithms that pertain to the evaluation of patients at risk for COVID-19 are in a state of rapid change based on information released by regulatory bodies including the CDC and federal and state organizations. These policies and algorithms were followed during the patient's care in the ED.    Final Clinical Impressions(s) / ED Diagnoses    Return for weakness, numbness, changes in vision or speech, fevers >100.4 unrelieved by medication, shortness of breath, intractable vomiting, or diarrhea, abdominal pain, Inability to tolerate liquids or food, cough, altered mental status or any concerns. No signs of systemic illness or infection. The patient is nontoxic-appearing on exam and vital signs are within normal limits.   I have reviewed the triage vital signs and the nursing notes. Pertinent labs &imaging results that were available during my care of the patient were reviewed by me and considered in my medical decision making (see chart for details).  After history, exam, and medical workup I feel the patient has been appropriately medically screened and is safe for discharge home. Pertinent diagnoses were discussed with the patient. Patient was given return precautions.   Mohmmad Saleeby, MD 08/10/19 0021

## 2019-08-10 NOTE — H&P (Signed)
Behavioral Health Medical Screening Exam  [name] is an 53 y.o. female patient presents as a walk in at [facility] requesting group counseling and housing.  Patient denies suicidal/self-harm/homicidal ideation, psychosis, and paranoia.  Patient states that she has been sleeping in her car; and she needs a place to stay.  Patient reports that she has outpatient psychiatric services at [facility]. Patient instructed to call [facility] and inform them that she would like group counseling.  Patient was also given resources for shelters.   During evaluation [name] is alert/oriented x 4; calm/cooperative; and mood is congruent with affect.  She does not appear to be responding to internal/external stimuli or delusional thoughts.  Patient denies suicidal/self-harm/homicidal ideation, psychosis, and paranoia.  Patient answered question appropriately.     Total Time spent with patient: 30 minutes  Psychiatric Specialty Exam: Physical Exam  Nursing note and vitals reviewed. Constitutional: She is oriented to person, place, and time. She appears well-developed and well-nourished. No distress.  Neck: Normal range of motion.  Respiratory: Effort normal.  Musculoskeletal: Normal range of motion.  Neurological: She is alert and oriented to person, place, and time.  Skin: Skin is warm and dry.  Psychiatric: Her speech is normal and behavior is normal. Judgment and thought content normal. Anxious: Stable. Cognition and memory are normal. Depressed: Stable.    Review of Systems  Psychiatric/Behavioral: Depression: Stable. Hallucinations: Denies. Memory loss: Denies. Substance abuse: Denies. Suicidal ideas: Denies. Nervous/anxious: Stable. Insomnia: Debies.   All other systems reviewed and are negative.   Blood pressure 123/84, pulse (!) 111, temperature 98.1 F (36.7 C), temperature source Oral, resp. rate 18, last menstrual period 10/29/2011, SpO2 97 %.There is no height or  weight on file to calculate BMI.  General Appearance: Casual  Eye Contact:  Good  Speech:  Clear and Coherent and Normal Rate  Volume:  Normal  Mood:  "Fine" Appropriate  Affect:  Appropriate and Congruent  Thought Process:  Coherent, Goal Directed and Descriptions of Associations: Intact  Orientation:  Full (Time, Place, and Person)  Thought Content:  WDL and Logical  Suicidal Thoughts:  No  Homicidal Thoughts:  No  Memory:  Immediate;   Good Recent;   Good  Judgement:  Intact  Insight:  Present  Psychomotor Activity:  Normal  Concentration: Concentration: Good and Attention Span: Good  Recall:  Good  Fund of Knowledge:Fair  Language: Good  Akathisia:  No  Handed:  Right  AIMS (if indicated):     Assets:  Communication Skills Desire for Improvement Social Support  Sleep:       Musculoskeletal: Strength & Muscle Tone: within normal limits Gait & Station: normal Patient leans: N/A  Blood pressure 123/84, pulse (!) 111, temperature 98.1 F (36.7 C), temperature source Oral, resp. rate 18, last menstrual period 10/29/2011, SpO2 97 %.  Recommendations:Outpatient  Psychiatric services.  Follow up with [facility], Resources given for shelters and [facility]  Based on my evaluation the patient does not appear to have an emergency medical condition.    Disposition: No evidence of imminent risk to self or others at present.   Patient does not meet criteria for psychiatric inpatient admission. Supportive therapy provided about ongoing stressors. Discussed crisis plan, support from social network, calling 911, coming to the Emergency Department, and calling Suicide Hotline. Jamie Colley, NP 08/10/2019, 2:09 PM

## 2019-08-10 NOTE — BH Assessment (Signed)
Assessment Note  [name] is an 53 y.o. female. Pt denies SI/HI and AVH. Pt states that she is homeless and living in her car and she needs someone to help her. The Pt has been seen at ED multiple times for psychiatric and physical needs. Pt denies SA. Pt states she is seeing someone to talk to about her housing situation.   [place], NP recommends D/C with housing resources.   Diagnosis:  F25.0 Schizoaffective disorder  Past Medical History:  Past Medical History:  Diagnosis Date  . Anxiety   . Delusional disorder ([place])   . Diabetes mellitus without complication ([place])   . Homelessness   . Hypertension   . Major depressive disorder, recurrent, severe with psychotic features ([place])   . Overdose   . Paranoid schizophrenia ([place])   . Schizophrenia ([place])   . Tremors of nervous system     No past surgical history on file.  Family History:  Family History  Problem Relation Age of Onset  . Hypertension Mother   . Diabetes Mother   . Aneurysm Mother   . Hypertension Father   . Diabetes Father   . Hypertension Sister   . Diabetes Sister   . Hypertension Other   . Diabetes Other     Social History:  reports that she has never smoked. She has never used smokeless tobacco. She reports that she does not drink alcohol or use drugs.  Additional Social History:  Alcohol / Drug Use Pain Medications: please see mar Prescriptions: please see mar Over the Counter: please see mar History of alcohol / drug use?: No history of alcohol / drug abuse Longest period of sobriety (when/how long): NA  CIWA: CIWA-Ar BP: 123/84 Pulse Rate: (!) 111 COWS:    Allergies: No Known Allergies  Home Medications: (Not in a hospital admission)   OB/GYN Status:  Patient's last menstrual period was 10/29/2011.  General Assessment Data Location of Assessment: [facility] TTS Assessment: In system Is this a Tele or Face-to-Face Assessment?: Face-to-Face Is this an Initial Assessment  or a Re-assessment for this encounter?: Initial Assessment Patient Accompanied by:: N/A Language Other than English: No Living Arrangements: Homeless/Shelter What gender do you identify as?: Female Marital status: Single Maiden name: NA Pregnancy Status: No Living Arrangements: Other (Comment) Can pt return to current living arrangement?: Yes Admission Status: Voluntary Is patient capable of signing voluntary admission?: Yes Referral Source: Self/Family/Friend Insurance type: SP  Medical Screening Exam ([place]) Medical Exam completed: Yes  Crisis Care Plan Living Arrangements: Other (Comment) Legal Guardian: Other:(NA) Name of Psychiatrist: NA Name of Therapist: NA  Education Status Is patient currently in school?: No Highest grade of school patient has completed: Master's degree from [employer] Is the patient employed, unemployed or receiving disability?: Receiving disability income  Risk to self with the past 6 months Suicidal Ideation: No Has patient been a risk to self within the past 6 months prior to admission? : No Suicidal Intent: No Has patient had any suicidal intent within the past 6 months prior to admission? : No Is patient at risk for suicide?: No Suicidal Plan?: No Has patient had any suicidal plan within the past 6 months prior to admission? : No Access to Means: No What has been your use of drugs/alcohol within the last 12 months?: NA Previous Attempts/Gestures: No How many times?: 0 Other Self Harm Risks: NA Triggers for Past Attempts: Unknown Intentional Self Injurious Behavior: None Family Suicide History: No Recent stressful life event(s):  Other (Comment) Persecutory voices/beliefs?: No Depression: No Depression Symptoms: (pt denies) Substance abuse history and/or treatment for substance abuse?: No Suicide prevention information given to non-admitted patients: Not applicable  Risk to Others within the past 6 months Homicidal Ideation:  No Does patient have any lifetime risk of violence toward others beyond the six months prior to admission? : No Thoughts of Harm to Others: No Current Homicidal Intent: No Current Homicidal Plan: No Access to Homicidal Means: No Identified Victim: NA History of harm to others?: No Assessment of Violence: None Noted Violent Behavior Description: NA Does patient have access to weapons?: No Criminal Charges Pending?: No Does patient have a court date: No Is patient on probation?: No  Psychosis Hallucinations: None noted Delusions: None noted  Mental Status Report Appearance/Hygiene: Unremarkable Eye Contact: Fair Motor Activity: Freedom of movement Speech: Logical/coherent, Soft Level of Consciousness: Alert Mood: Anxious Affect: Anxious Anxiety Level: Minimal Thought Processes: Coherent, Relevant Judgement: Unimpaired Orientation: Person, Place, Time, Situation Obsessive Compulsive Thoughts/Behaviors: None  Cognitive Functioning Concentration: Normal Memory: Recent Intact, Remote Intact Is patient IDD: No Insight: Fair Impulse Control: Fair Appetite: Fair Have you had any weight changes? : No Change Sleep: No Change Total Hours of Sleep: 7 Vegetative Symptoms: None  ADLScreening [facility] Assessment Services) Patient's cognitive ability adequate to safely complete daily activities?: Yes Patient able to express need for assistance with ADLs?: Yes Independently performs ADLs?: Yes (appropriate for developmental age)  Prior Inpatient Therapy Prior Inpatient Therapy: Yes Prior Therapy Dates: 06/2019, multiple admits Prior Therapy Facilty/Provider(s): [facility] Reason for Treatment: Schizoaffective disorder  Prior Outpatient Therapy Prior Outpatient Therapy: No Does patient have an ACCT team?: No Does patient have Intensive In-House Services?  : No Does patient have [facility] services? : No Does patient have P4CC services?: No  ADL Screening (condition at time of  admission) Patient's cognitive ability adequate to safely complete daily activities?: Yes Is the patient deaf or have difficulty hearing?: No Does the patient have difficulty seeing, even when wearing glasses/contacts?: No Does the patient have difficulty concentrating, remembering, or making decisions?: No Patient able to express need for assistance with ADLs?: Yes Does the patient have difficulty dressing or bathing?: No Independently performs ADLs?: Yes (appropriate for developmental age)       Abuse/Neglect Assessment (Assessment to be complete while patient is alone) Abuse/Neglect Assessment Can Be Completed: Yes Physical Abuse: Denies Verbal Abuse: Denies Sexual Abuse: Denies Exploitation of patient/patient's resources: Denies     Regulatory affairs officer (For Healthcare) Does Patient Have a Medical Advance Directive?: No Would patient like information on creating a medical advance directive?: No - Patient declined          Disposition:  Disposition Initial Assessment Completed for this Encounter: Yes Disposition of Patient: Discharge  On Site Evaluation by:   Reviewed with Physician:    Cyndia Bent 08/10/2019 3:39 PM

## 2019-08-17 ENCOUNTER — Telehealth: Payer: Self-pay | Admitting: Neurology

## 2019-08-17 NOTE — Telephone Encounter (Signed)
Patient called back and pharm Kristopher Oppenheim address is [address] in [place] [address]. Thanks!

## 2019-08-17 NOTE — Telephone Encounter (Signed)
Patient called regarding her Gabapentin. She said that she is needing more called in because she spilled hers in her book bag. She said she did wash them off but was advised by the pharmacy not to take them. She said she has no refills left. She was to call back to let us know the name of the address of the [facility] in [place]. She called back but spoke to someone else. Not sure if she gave that information. Please Call [phone]. Thanks

## 2019-08-18 ENCOUNTER — Telehealth: Payer: Self-pay

## 2019-08-18 ENCOUNTER — Telehealth: Payer: Self-pay | Admitting: Neurology

## 2019-08-18 NOTE — Telephone Encounter (Signed)
Patient called from the pharmacy and said she got some of her October gabapentin wet and needs a replacement prescription sent in for about 20 pills.  She said the pharmacy has no refills on file.  [facility] [address]

## 2019-08-18 NOTE — Telephone Encounter (Signed)
Is this something we can refill and send in, since the pills spilled?

## 2019-08-18 NOTE — Telephone Encounter (Signed)
Pls let patient know that she has refills, thanks!

## 2019-08-18 NOTE — Telephone Encounter (Signed)
Pt called and advised on gabapentin patient has refills

## 2019-08-18 NOTE — Telephone Encounter (Signed)
Pharmacy is to send Rx electronically to the office

## 2019-08-18 NOTE — Telephone Encounter (Signed)
Patient left msg with after hours about wanting to speak with the doctor about her EEG- she is dizzy and had to R/S her MRI. Thanks!

## 2019-08-18 NOTE — Telephone Encounter (Signed)
Patient has refills on gabapentin and patient would like to have an virtual visit to discuss the EEG and etc, Patient will call back to office to schedule a time

## 2019-08-18 NOTE — Telephone Encounter (Signed)
1. Patient called and requested a referral for her EEG to a place somewhere in [place].  2. Patient called and requested a note for headgear to wear all day to protect her soft tissue. She said she'll need a prescription if the doctor thinks this is a good idea.

## 2019-08-18 NOTE — Telephone Encounter (Signed)
Monday is the EEG and patient has scheduled a virtual visit

## 2019-08-18 NOTE — Telephone Encounter (Signed)
Patient notified of refills.

## 2019-08-18 NOTE — Telephone Encounter (Signed)
Virtual visit scheduled with Dr. Delice Lesch for 10/13/19 at 11:00 AM and she is on the wait list.

## 2019-08-19 ENCOUNTER — Telehealth: Payer: Self-pay | Admitting: Neurology

## 2019-08-19 MED ORDER — GABAPENTIN 300 MG PO CAPS: 300.0000 mg | ORAL_CAPSULE | Freq: Three times a day (TID) | ORAL | 2 refills | Status: DC

## 2019-08-19 NOTE — Telephone Encounter (Signed)
I advised patient if she felt worse over the weekend to go to ER, otherwise follow up Monday as scheduled.

## 2019-08-19 NOTE — Telephone Encounter (Signed)
Requested Prescriptions   Pending Prescriptions Disp Refills  . gabapentin (NEURONTIN) 300 MG capsule 90 capsule 2    Sig: Take 1 capsule (300 mg total) by mouth 3 (three) times daily.   Rx last filled:07/20/19 #90 2 REFILLK  Pt last seen:06/17/19  Follow up appt scheduled:10/13/19  1. Proceed with MRI brain in [place]  2. Schedule EEG  3. Continue gabapentin 100mg  three times a day. Do not take more than prescribed

## 2019-08-19 NOTE — Telephone Encounter (Signed)
Left a message with the after hour service on 08-19-19 @ 12:04 pm     Caller states that she needs her medication replaced and refills. She damaged some of her prescription and does not have any

## 2019-08-19 NOTE — Telephone Encounter (Signed)
Patient would like to go ahead and schedule the open MRI in [facility] she is light headed and dizzy please call

## 2019-08-20 DIAGNOSIS — R519 Headache, unspecified: Secondary | ICD-10-CM | POA: Diagnosis not present

## 2019-08-20 DIAGNOSIS — Z8659 Personal history of other mental and behavioral disorders: Secondary | ICD-10-CM | POA: Diagnosis not present

## 2019-08-20 DIAGNOSIS — R42 Dizziness and giddiness: Secondary | ICD-10-CM | POA: Diagnosis not present

## 2019-08-20 DIAGNOSIS — R9431 Abnormal electrocardiogram [ECG] [EKG]: Secondary | ICD-10-CM | POA: Diagnosis not present

## 2019-08-21 DIAGNOSIS — R42 Dizziness and giddiness: Secondary | ICD-10-CM | POA: Diagnosis not present

## 2019-08-22 ENCOUNTER — Ambulatory Visit: Payer: Medicare Other | Admitting: Neurology

## 2019-08-22 ENCOUNTER — Other Ambulatory Visit: Payer: Self-pay

## 2019-08-22 ENCOUNTER — Other Ambulatory Visit: Payer: Medicare Other

## 2019-09-07 NOTE — Procedures (Signed)
ELECTROENCEPHALOGRAM REPORT  Date of Study: 08/22/2019  Patient's Name: [name] MRN: [id number] Date of Birth: 1966/07/09  Referring Provider: Dr. Ellouise Newer  Clinical History: This is a 53 year old woman with schizoaffective disorder with paranoia, delusions, reporting "seizures" described as an internal shaking sensation. EEG for classification.  Medications: Gabapentin  Technical Summary: A multichannel digital EEG recording measured by the international 10-20 system with electrodes applied with paste and impedances below 5000 ohms performed in our laboratory with EKG monitoring in an awake patient.  Hyperventilation was not performed. Photic stimulation was performed.  The digital EEG was referentially recorded, reformatted, and digitally filtered in a variety of bipolar and referential montages for optimal display.    Description: The EEG is limited, with only 7 minutes of recording then patient insisted that study be stopped and electrodes removed. She is awake during the recording.  During maximal wakefulness, there is a symmetric, medium voltage 8-9 Hz posterior dominant rhythm that attenuates with eye opening.  The record is symmetric. Sleep was not captured. Photic stimulation did not elicit any abnormalities, patient could not tolerate past 15 Hz IPS. She reported heart racing, EKG lead showed NSR at 60 bpm. She reported nosebleeding holding out a tissue paper, no bleeding noted by EEG technician. There were no epileptiform discharges or electrographic seizures seen.    EKG lead was unremarkable.  Impression: This is a limited EEG after patient insisted discontinuation after 7 minutes of recording. EEG was normal, patient reported palpitations with normal EKG and had delusions of nosebleeding (none noted by staff).   Clinical Correlation: This study is non-diagnostic since only 7 minutes of EEG were recorded.    Ellouise Newer, M.D.

## 2019-09-11 DIAGNOSIS — R1111 Vomiting without nausea: Secondary | ICD-10-CM | POA: Diagnosis not present

## 2019-09-11 DIAGNOSIS — F419 Anxiety disorder, unspecified: Secondary | ICD-10-CM | POA: Diagnosis not present

## 2019-09-11 DIAGNOSIS — R200 Anesthesia of skin: Secondary | ICD-10-CM | POA: Diagnosis not present

## 2019-09-11 DIAGNOSIS — T7840XA Allergy, unspecified, initial encounter: Secondary | ICD-10-CM | POA: Diagnosis not present

## 2019-09-11 DIAGNOSIS — R569 Unspecified convulsions: Secondary | ICD-10-CM | POA: Diagnosis not present

## 2019-09-11 DIAGNOSIS — Z8639 Personal history of other endocrine, nutritional and metabolic disease: Secondary | ICD-10-CM | POA: Diagnosis not present

## 2019-09-11 DIAGNOSIS — Y999 Unspecified external cause status: Secondary | ICD-10-CM | POA: Diagnosis not present

## 2019-09-11 DIAGNOSIS — R110 Nausea: Secondary | ICD-10-CM | POA: Diagnosis not present

## 2019-09-11 DIAGNOSIS — R202 Paresthesia of skin: Secondary | ICD-10-CM | POA: Diagnosis not present

## 2019-09-11 DIAGNOSIS — X58XXXA Exposure to other specified factors, initial encounter: Secondary | ICD-10-CM | POA: Diagnosis not present

## 2019-09-11 DIAGNOSIS — R42 Dizziness and giddiness: Secondary | ICD-10-CM | POA: Diagnosis not present

## 2019-09-11 DIAGNOSIS — R531 Weakness: Secondary | ICD-10-CM | POA: Diagnosis not present

## 2019-09-11 DIAGNOSIS — Z8679 Personal history of other diseases of the circulatory system: Secondary | ICD-10-CM | POA: Diagnosis not present

## 2019-09-11 DIAGNOSIS — Z8659 Personal history of other mental and behavioral disorders: Secondary | ICD-10-CM | POA: Diagnosis not present

## 2019-09-12 ENCOUNTER — Telehealth: Payer: Self-pay | Admitting: Neurology

## 2019-09-12 NOTE — Telephone Encounter (Signed)
Patient left msg with after hours about having japanese food and not knowing she couldn't have it. She is having dizziness and lightheadedness. She took gabapentin, tylenol and advil and felt better. States she is now very dizzy and lightheaded especially on the right side where she had physical damage to her forehead. States no one wants to give her another scan. The after hours nurse instructed her to go to the ER. Thanks!

## 2019-09-15 ENCOUNTER — Other Ambulatory Visit: Payer: Self-pay

## 2019-09-15 ENCOUNTER — Encounter: Payer: Self-pay | Admitting: Neurology

## 2019-09-15 ENCOUNTER — Telehealth (INDEPENDENT_AMBULATORY_CARE_PROVIDER_SITE_OTHER): Payer: Medicare Other | Admitting: Neurology

## 2019-09-15 VITALS — Ht 65.5 in | Wt 254.0 lb

## 2019-09-15 DIAGNOSIS — R42 Dizziness and giddiness: Secondary | ICD-10-CM

## 2019-09-15 DIAGNOSIS — R251 Tremor, unspecified: Secondary | ICD-10-CM

## 2019-09-15 DIAGNOSIS — G44219 Episodic tension-type headache, not intractable: Secondary | ICD-10-CM

## 2019-09-15 DIAGNOSIS — F200 Paranoid schizophrenia: Secondary | ICD-10-CM

## 2019-09-15 DIAGNOSIS — R200 Anesthesia of skin: Secondary | ICD-10-CM

## 2019-09-15 MED ORDER — GABAPENTIN 300 MG PO CAPS: 300.0000 mg | ORAL_CAPSULE | Freq: Three times a day (TID) | ORAL | 11 refills | Status: DC

## 2019-09-15 NOTE — Telephone Encounter (Signed)
Patient has called in today and she is wanting to know if Dr. Delice Lesch can send a letter to [facility] on letter head stating that they should  provide her a place to stay due to her having to leave her home due to harrassment. She said that the  Seizures are stress induced. She has been staying at the [facility] in [place]. Please Advise. Thank you
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
## 2019-09-15 NOTE — Telephone Encounter (Signed)
Dr. Delice Lesch, Did pt mention this to you during her VV today? I am not sure how to get housing through [facility]?

## 2019-09-16 ENCOUNTER — Telehealth: Payer: Self-pay | Admitting: Neurology

## 2019-09-16 NOTE — Telephone Encounter (Signed)
I have addressed this in the previous telephone note.
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
## 2019-09-16 NOTE — Telephone Encounter (Signed)
Left message for pt to return call if needed. On message stated that Dr. Delice Lesch was not aware of how [facility] provides housing and if this was something we could help pt with. Asked pt to return call if she had more information on [facility] and if appropriate we could assist her.

## 2019-09-16 NOTE — Telephone Encounter (Signed)
No, we did not talk about this, and I don't think this is something we can help her with.

## 2019-09-19 ENCOUNTER — Telehealth: Payer: Self-pay | Admitting: Neurology

## 2019-09-19 DIAGNOSIS — Z8659 Personal history of other mental and behavioral disorders: Secondary | ICD-10-CM | POA: Diagnosis not present

## 2019-09-19 DIAGNOSIS — R42 Dizziness and giddiness: Secondary | ICD-10-CM | POA: Diagnosis not present

## 2019-09-19 DIAGNOSIS — R110 Nausea: Secondary | ICD-10-CM | POA: Diagnosis not present

## 2019-09-19 DIAGNOSIS — Z5320 Procedure and treatment not carried out because of patient's decision for unspecified reasons: Secondary | ICD-10-CM | POA: Diagnosis not present

## 2019-09-19 NOTE — Telephone Encounter (Signed)
Patient called in regarding her feeling Nauseas and Vomiting. She said that she is still in the Process of finding a PCP. She said that the ER had initially prescribed her the medication for the symptoms. She is wanting to know if Dr. Delice Lesch can call in medications for those Symptoms. She uses [facility] on [address] in [place]. Please Call. Thanks

## 2019-09-19 NOTE — Telephone Encounter (Signed)
Dr. Delice Lesch, are you ok sending these medications in for pt?

## 2019-09-20 DIAGNOSIS — I10 Essential (primary) hypertension: Secondary | ICD-10-CM | POA: Diagnosis not present

## 2019-09-20 DIAGNOSIS — R29818 Other symptoms and signs involving the nervous system: Secondary | ICD-10-CM | POA: Diagnosis not present

## 2019-09-20 DIAGNOSIS — E119 Type 2 diabetes mellitus without complications: Secondary | ICD-10-CM | POA: Diagnosis not present

## 2019-09-20 DIAGNOSIS — Z79899 Other long term (current) drug therapy: Secondary | ICD-10-CM | POA: Diagnosis not present

## 2019-09-20 DIAGNOSIS — Z590 Homelessness: Secondary | ICD-10-CM | POA: Diagnosis not present

## 2019-09-20 DIAGNOSIS — E669 Obesity, unspecified: Secondary | ICD-10-CM | POA: Diagnosis not present

## 2019-09-20 DIAGNOSIS — R519 Headache, unspecified: Secondary | ICD-10-CM | POA: Diagnosis not present

## 2019-09-20 DIAGNOSIS — Z23 Encounter for immunization: Secondary | ICD-10-CM | POA: Diagnosis not present

## 2019-09-20 DIAGNOSIS — F200 Paranoid schizophrenia: Secondary | ICD-10-CM | POA: Diagnosis not present

## 2019-09-20 DIAGNOSIS — Z6841 Body Mass Index (BMI) 40.0 and over, adult: Secondary | ICD-10-CM | POA: Diagnosis not present

## 2019-09-20 DIAGNOSIS — R202 Paresthesia of skin: Secondary | ICD-10-CM | POA: Diagnosis not present

## 2019-09-20 DIAGNOSIS — R42 Dizziness and giddiness: Secondary | ICD-10-CM | POA: Diagnosis not present

## 2019-09-21 ENCOUNTER — Telehealth: Payer: Self-pay | Admitting: Neurology

## 2019-09-21 ENCOUNTER — Other Ambulatory Visit: Payer: Self-pay

## 2019-09-21 DIAGNOSIS — E119 Type 2 diabetes mellitus without complications: Secondary | ICD-10-CM | POA: Insufficient documentation

## 2019-09-21 MED ORDER — METOCLOPRAMIDE HCL 10 MG PO TABS: 10.0000 mg | ORAL_TABLET | ORAL | 0 refills | Status: DC | PRN

## 2019-09-21 NOTE — Telephone Encounter (Signed)
Left message informing pt that I was not sure if the location where she ws having the open MRI required a driver. She will need to call them and verify.

## 2019-09-21 NOTE — Telephone Encounter (Signed)
Patient states that she has a MRI schedule for Sunday and needs some medication to help her relax please call in to the [facility] on [address] in [place]. She does not have a driver she states

## 2019-09-21 NOTE — Telephone Encounter (Signed)
Pls let her know I can only send in one prescription for a month, then after this she has to get from a primary care doctor. Pls send in Rx for metoclopramide 10mg  as needed for nausea/vomiting, #30 with no refills, thanks

## 2019-09-21 NOTE — Telephone Encounter (Signed)
Informed pt that Dr. Delice Lesch prescribed Metoclopramide for one month and that if she needs further refills she will need to contact her PCP.

## 2019-09-25 ENCOUNTER — Other Ambulatory Visit: Payer: Medicare Other

## 2019-09-26 ENCOUNTER — Telehealth: Payer: Self-pay | Admitting: Neurology

## 2019-09-26 NOTE — Telephone Encounter (Signed)
Patients MRI was scheduled 09/25/2019 but patient canceled because she didn't have a ride.  Left message for patient to reschedule MRI then valium rx can be sent.

## 2019-09-26 NOTE — Telephone Encounter (Signed)
Patient left a couple of messages with the after hours services about her MRI on Sunday and needing a prescription for that because of extreme claustrophobia. She also left a message about calling an ambulance and they checked her heart now she said everything is fine and she isn't going to the ER. She is going to a restaurant to eat a salad and apple for her dizziness.

## 2019-09-26 NOTE — Telephone Encounter (Signed)
MRI has not been scheduled.  Will send valium rx after appointment has been scheduled.  Message sent to follow up on reason MRI has not been scheduled.

## 2019-09-26 NOTE — Telephone Encounter (Signed)
Pls check when her MRI brain is scheduled, this has been scheduled/rescheduled so many times. Ok to give one dose of Valium 5mg  prior to MRI. Pls let her know we cannot prescribe this medication further after. Thanks

## 2019-10-07 ENCOUNTER — Other Ambulatory Visit: Payer: Self-pay

## 2019-10-07 ENCOUNTER — Telehealth: Payer: Self-pay | Admitting: Neurology

## 2019-10-07 MED ORDER — DIAZEPAM 5 MG PO TABS: 5.0000 mg | ORAL_TABLET | ORAL | 0 refills | Status: DC

## 2019-10-07 NOTE — Telephone Encounter (Signed)
Left message with after hour service on 10-07-19 @ 12:52 pm   Caller states that she needs a RX called in for her procedure Monday  Please call pt back

## 2019-10-07 NOTE — Addendum Note (Signed)
Addended by: Cameron Sprang on: 10/07/2019 03:21 PM   Modules accepted: Orders

## 2019-10-07 NOTE — Telephone Encounter (Signed)
Dr. Delice Lesch aware. She will send in Valium 5mg  #2. Take one tab 30 minutes prior to MRI and repeat if needed.

## 2019-10-10 ENCOUNTER — Other Ambulatory Visit: Payer: Medicare Other

## 2019-10-13 ENCOUNTER — Telehealth: Payer: Medicare Other | Admitting: Neurology

## 2019-10-14 ENCOUNTER — Other Ambulatory Visit: Payer: Self-pay

## 2019-10-14 ENCOUNTER — Telehealth: Payer: Self-pay | Admitting: Neurology

## 2019-10-14 MED ORDER — DIAZEPAM 5 MG PO TABS: 5.0000 mg | ORAL_TABLET | ORAL | 0 refills | Status: DC

## 2019-10-14 NOTE — Telephone Encounter (Signed)
Dr. Delice Lesch you will need to send in the prescription since it is controlled. I forgot. Pt is scheduled to have the MRI on Monday the 21st.

## 2019-10-14 NOTE — Telephone Encounter (Signed)
Dr. Delice Lesch,  I confirmed with the [facility] that pt has not picked up the prescription that was sent on 12/11. Are you ok with sending in to the pharmacy pt is requesting now? I have not call her at this point to see why she needs to change. I know that she moves around a lot.

## 2019-10-14 NOTE — Telephone Encounter (Signed)
Patient called and requested a new prescription for diazepam 5 MG to be sent to a new pharmacy she will be using.  [facility] in [place]

## 2019-10-14 NOTE — Telephone Encounter (Signed)
Done

## 2019-10-14 NOTE — Telephone Encounter (Signed)
This was for the MRI, as long as she has not picked up other Rx and will be having her MRI soon, that is fine, thanks

## 2019-10-17 ENCOUNTER — Other Ambulatory Visit: Payer: Medicare Other

## 2019-11-05 DIAGNOSIS — R42 Dizziness and giddiness: Secondary | ICD-10-CM | POA: Diagnosis not present

## 2019-11-05 DIAGNOSIS — R069 Unspecified abnormalities of breathing: Secondary | ICD-10-CM | POA: Diagnosis not present

## 2019-11-05 DIAGNOSIS — G4489 Other headache syndrome: Secondary | ICD-10-CM | POA: Diagnosis not present

## 2019-11-07 ENCOUNTER — Telehealth: Payer: Self-pay | Admitting: Neurology

## 2019-11-07 ENCOUNTER — Other Ambulatory Visit: Payer: Self-pay

## 2019-11-07 ENCOUNTER — Ambulatory Visit: Payer: Medicare Other

## 2019-11-07 NOTE — Telephone Encounter (Signed)
Left message for pt to return call. Informed on message that the MRI under sedation would be the next step. If that is something she wishes to do she would need to let us know.  Dr. Delice Lesch,  Is there another scan that she will need prior to the MRI under sedation?

## 2019-11-07 NOTE — Telephone Encounter (Signed)
Left message with the after hour service on 11-07-19 @ 12:48 pm   Caller states that she tried having a open MRI today and was not able to do it she could not go into the machine, she feels like she is going to pass out and the right side f her forehead feels like a stroke can come on.  Has stroke Sx that she feels it is stress induced.   Patient is feeling better now and does not want triage she just wants to speak with the Dr about what else can do about  the MRI   Please call

## 2019-11-07 NOTE — Telephone Encounter (Signed)
No, but she will need a visit 30 days prior to the scan. She has a visit with me next month, can try scheduling MRI after her Feb visit. Thanks

## 2019-11-07 NOTE — Telephone Encounter (Signed)
The only other option would be MRI under sedation, done in the hospital.

## 2019-11-09 DIAGNOSIS — H5213 Myopia, bilateral: Secondary | ICD-10-CM | POA: Diagnosis not present

## 2019-11-09 DIAGNOSIS — H524 Presbyopia: Secondary | ICD-10-CM | POA: Diagnosis not present

## 2019-11-09 DIAGNOSIS — H43393 Other vitreous opacities, bilateral: Secondary | ICD-10-CM | POA: Diagnosis not present

## 2019-11-09 DIAGNOSIS — E119 Type 2 diabetes mellitus without complications: Secondary | ICD-10-CM | POA: Diagnosis not present

## 2019-11-11 DIAGNOSIS — Z03818 Encounter for observation for suspected exposure to other biological agents ruled out: Secondary | ICD-10-CM | POA: Diagnosis not present

## 2019-11-14 ENCOUNTER — Other Ambulatory Visit: Payer: Medicare Other

## 2019-11-17 DIAGNOSIS — Z79899 Other long term (current) drug therapy: Secondary | ICD-10-CM | POA: Diagnosis not present

## 2019-11-17 DIAGNOSIS — R05 Cough: Secondary | ICD-10-CM | POA: Diagnosis not present

## 2019-11-17 DIAGNOSIS — R32 Unspecified urinary incontinence: Secondary | ICD-10-CM | POA: Diagnosis not present

## 2019-11-17 DIAGNOSIS — F259 Schizoaffective disorder, unspecified: Secondary | ICD-10-CM | POA: Diagnosis not present

## 2019-11-17 DIAGNOSIS — E669 Obesity, unspecified: Secondary | ICD-10-CM | POA: Diagnosis not present

## 2019-11-17 DIAGNOSIS — F333 Major depressive disorder, recurrent, severe with psychotic symptoms: Secondary | ICD-10-CM | POA: Diagnosis not present

## 2019-11-17 DIAGNOSIS — Z6841 Body Mass Index (BMI) 40.0 and over, adult: Secondary | ICD-10-CM | POA: Diagnosis not present

## 2019-11-17 DIAGNOSIS — R7303 Prediabetes: Secondary | ICD-10-CM | POA: Diagnosis not present

## 2019-11-17 DIAGNOSIS — Z590 Homelessness: Secondary | ICD-10-CM | POA: Diagnosis not present

## 2019-11-17 DIAGNOSIS — F250 Schizoaffective disorder, bipolar type: Secondary | ICD-10-CM | POA: Diagnosis not present

## 2019-11-17 DIAGNOSIS — I10 Essential (primary) hypertension: Secondary | ICD-10-CM | POA: Diagnosis not present

## 2019-11-26 ENCOUNTER — Ambulatory Visit (INDEPENDENT_AMBULATORY_CARE_PROVIDER_SITE_OTHER): Payer: Medicare Other

## 2019-11-26 ENCOUNTER — Other Ambulatory Visit: Payer: Self-pay

## 2019-11-26 DIAGNOSIS — R251 Tremor, unspecified: Secondary | ICD-10-CM

## 2019-11-26 DIAGNOSIS — R202 Paresthesia of skin: Secondary | ICD-10-CM | POA: Diagnosis not present

## 2019-11-26 DIAGNOSIS — R200 Anesthesia of skin: Secondary | ICD-10-CM | POA: Diagnosis not present

## 2019-11-26 DIAGNOSIS — R42 Dizziness and giddiness: Secondary | ICD-10-CM

## 2019-11-28 ENCOUNTER — Telehealth: Payer: Self-pay

## 2019-11-28 NOTE — Telephone Encounter (Signed)
Pt called an informed the good news that her brain MRI is normal. The brain structure is normal. We had discussed how stress can cause a lot of her symptoms, there is no neurological cause of her symptoms, recommend continued stress management and follow-up with PCP. Pt stated that she is still under a lot of stress she said that is week is her last week at the [facility] asking about her EEG stated that a bump on her forehead is the cause of her symptoms

## 2019-11-29 ENCOUNTER — Telehealth: Payer: Self-pay | Admitting: Neurology

## 2019-11-29 DIAGNOSIS — G40909 Epilepsy, unspecified, not intractable, without status epilepticus: Secondary | ICD-10-CM | POA: Diagnosis not present

## 2019-11-29 DIAGNOSIS — F419 Anxiety disorder, unspecified: Secondary | ICD-10-CM | POA: Diagnosis not present

## 2019-11-29 DIAGNOSIS — R569 Unspecified convulsions: Secondary | ICD-10-CM | POA: Diagnosis not present

## 2019-11-29 DIAGNOSIS — R110 Nausea: Secondary | ICD-10-CM | POA: Diagnosis not present

## 2019-11-29 DIAGNOSIS — R251 Tremor, unspecified: Secondary | ICD-10-CM | POA: Diagnosis not present

## 2019-11-29 NOTE — Telephone Encounter (Signed)
Follow-up with PCP for all these symptoms, gabapentin is not strong enough because it is not for convulsions from the chest, that is why it is not helping. Thanks

## 2019-11-29 NOTE — Telephone Encounter (Signed)
Pt called no answer voice mail left for her to call back

## 2019-11-29 NOTE — Telephone Encounter (Signed)
Pls reassure her that the EEG brain wave test, although it was cut short because she could not finish it, was normal. The convulsions she is having are also stress-induced. Gabapentin likely helps because it helps with anxiety. Follow-up with PCP for the bump on forehead, her brain is normal, it is not neurological. Thanks

## 2019-11-29 NOTE — Telephone Encounter (Signed)
PT  Now stating that convulsions are not from the bump on her forehead its starting in her Chest. She said she starts shaking in her chest.  This morning she said she had to go to [facility] for the shaking in her chest felt like she was going to vomit she was given Zofran and gabapentin and rested and sent home, now just a little weak, pt has a little cough but " not the COVID" she is asking for a test to take to see where the convulsions in her chest are coming from, she said she would follow up with her PCP about the bump on her forehead. Also the Gabapentin she is on now isnt strong enough to hold her out of her convulsions

## 2019-11-29 NOTE — Telephone Encounter (Signed)
Patient left msg with after hours about having convulsions and has a bump on her forehead. She said the right side of her body and mid section will feel like it is going to start jerking. Gabapentin is helping she said. Thanks!

## 2019-11-30 NOTE — Telephone Encounter (Signed)
Pt called no answer voice mail left for her to call back

## 2019-12-01 NOTE — Telephone Encounter (Signed)
Pt called with no answer voice mail left for her to call the office back.. pt needs to follow up with PCP about her symptoms gabapentin is not strong enough because it is not for convulsions from the chest, that is why it is not helping.

## 2019-12-02 ENCOUNTER — Other Ambulatory Visit: Payer: Self-pay | Admitting: Neurology

## 2019-12-02 MED ORDER — GABAPENTIN 300 MG PO CAPS: 300.0000 mg | ORAL_CAPSULE | Freq: Three times a day (TID) | ORAL | 0 refills | Status: DC

## 2019-12-02 NOTE — Telephone Encounter (Signed)
Patient lmom regarding her needing a refill on her Gabapentin medication. She said that she is no longer at the [employer]. Her bottle of medication was left there. She is now in [place], [place]. She would like you to please call her. Thank you

## 2019-12-02 NOTE — Telephone Encounter (Signed)
Pharm is [facility]. Please send medication. Thanks!

## 2019-12-02 NOTE — Telephone Encounter (Signed)
Medication sent to pharm

## 2019-12-14 NOTE — Progress Notes (Signed)
Pt stated that she is in her car in [place] at this time, she isnt at the [facility] anymore she is looking for a shelter. Pt went over medication, allergies and history and then got off the phone,

## 2019-12-16 ENCOUNTER — Telehealth: Payer: Self-pay | Admitting: Neurology

## 2019-12-16 ENCOUNTER — Telehealth (INDEPENDENT_AMBULATORY_CARE_PROVIDER_SITE_OTHER): Payer: Medicare Other | Admitting: Neurology

## 2019-12-16 ENCOUNTER — Other Ambulatory Visit: Payer: Self-pay

## 2019-12-16 DIAGNOSIS — F200 Paranoid schizophrenia: Secondary | ICD-10-CM

## 2019-12-16 NOTE — Telephone Encounter (Signed)
Spoke with pt she stated that something is wrong, she needs a reassessment, that a reassessment is going to be done that something is not right with her right side and that bump on her head.

## 2019-12-16 NOTE — Progress Notes (Signed)
Telephone (Audio) Visit The purpose of this telephone visit is to provide medical care while limiting exposure to the novel coronavirus.    Consent was obtained for telephone visit:  Yes.   Answered questions that patient had about telehealth interaction:  Yes.   I discussed the limitations, risks, security and privacy concerns of performing an evaluation and management service by telephone. I also discussed with the patient that there may be a patient responsible charge related to this service. The patient expressed understanding and agreed to proceed.  Pt location: Home Physician Location: office Name of referring provider:  No ref. provider found I connected with .[name] at patients initiation/request on 12/16/2019 at 10:00 AM EST by telephone and verified that I am speaking with the correct person using two identifiers.  Pt MRN:  [id number] Pt DOB:  1966-04-14   History of Present Illness:  The patient had a telephone visit on 12/16/2019. She was last evaluated 3 months ago in the neurology clinic for dizziness. Since her last visit, she has had the MRI brain without contrast done January 2021 I personally reviewed and was completely normal. Skull is normal. She has been hyperfocused on a dent on the right side of her head and was reassured the skull on MRI is normal. There is note of a small area of posterior vertex scalp scarring, which may be what she feels, and reassured to be external/superficial. She continues to report "seizures and convulsions" in her abdominal region, there has been no physical shaking activity. Her EEG, although cut short due to her request, was also normal.   She unfortunately has paranoid delusions that she has no insight into, hindering care. She is homeless and is currently in her car. She drove to [facility] and states she had to call the police 6 times until they finally gave her a non-emergency number to call. She refused to drive to [place] so she  drove back to [employer] and has been taking bird baths at a gas station and [place]. She states she has included Oprah in her emails when the authorities in [place] were in her email. She sent Oprah emails that she is trying to get into a homeless shelter and the wait is 2 weeks. She is asking for help, saying "I'm at the PhD level, somebody should come out here and help me," asking Oprah to buy her a house and tell her story. She states it is hard to arrest these guys because it is cyberstalking. She endorses being stressed, nervous, scared, "but I'm making it." She took some gabapentin last night but "in my soul, somewhere in my abdomen area those convulsions and seizures are coming up." She has a headache right now and still has Tylenol.   History on Initial Assessment 05/12/2018: This is a pleasant 54 year old right-handed woman presenting for evaluation of dizziness. She is calm, pleasant, and articulate in the office today. On review of EPIC records, she has been to the ER numerous times for dizziness and different psychological diagnoses since 2012. She has had several inpatient Psychiatry admissions but is currently not taking any psychotropic medication, stating all her of symptoms are physical. She reports that dizziness started around 2008, but worsened since February 2019 to the point now that several ER physicians have asked her to see Neurology. She reports that she filed for harrassment at work in 2006, "he did something else in 2007," then "he placed her on cyberware in 2008" and she  has been suffering from the cyberware since then. She describes a sensation of blunt force trauma to her head in addition to the other medical issues she has. She denies any physical abuse from prior harrassment, stating "at that time it was a hostile work environment." She brings paperwork she has filed Mar 08, 2018 at the [name] to "enter [facility]'s appointment for me as May 12, 2018 instead  of immediately due to MRI not being in [place] wants to re-enter a copy of discharge paperwork for 02/16/2018 showing Plaintiff received the wrong discharge paperwork in reference to all of this cyberstalking." She describes the dizziness as imbalance, if she stands she feels like she will fall over and has to catch herself. She usually sits down and feels better. She states it does not occur each time she stands, but occurs more often than not. She states that "right now" she is feeling just a blunt force trauma on her frontal and periorbital regions, like a pressure. She states she has been going through something recently with cyberware, "until someone gets this thing turned off me." If she tries to stay home, the suffering gets worse so she tries to go out. She states the pressure would go down but the "actual physical suffering of being on the cyberware lessens." She denies any headaches, nausea/vomiting, diplopia, dysarthria/dysphagia, bowel/bladder dysfunction. She has occasional numbness and tingling in her fingers and toes. She has some neck and back pain and some urinary urgency. She states the FBI is involved and keeps repeating the name "Ms. [name]," who has told her she needed severe bedrest due to all of this. She has not seen a PCP since 2006. She is not taking any medications, we discussed medications listed on her file including Cogentin, Prolixin, Zyprexa, and Trazodone, she states that these were prescribed in the ER when she went to get xrayed to check for cyberware, "for some reason I got placed immediately with Behavioral Health and did not even get the xray."      Current Outpatient Medications on File Prior to Visit  Medication Sig Dispense Refill  . diazepam (VALIUM) 5 MG tablet Take 1 tablet (5 mg total) by mouth as directed. Take 1 tablet 30 minutes before MRI. May repeat if needed (Patient not taking: Reported on 12/14/2019) 2 tablet 0  .  gabapentin (NEURONTIN) 300 MG capsule Take 1 capsule (300 mg total) by mouth 3 (three) times daily. 90 capsule 0  . metoCLOPramide (REGLAN) 10 MG tablet Take 1 tablet (10 mg total) by mouth as needed for nausea. Take 1 tablet as needed for nausea and vomitting 30 tablet 0  . tolterodine (DETROL LA) 2 MG 24 hr capsule Take by mouth.     No current facility-administered medications on file prior to visit.     Observations/Objective:  Exam limited due to nature of phone visit, patient is awake, alert, calm, with continued paranoid delusions of cyberstalking.   Assessment and Plan:   This is a pleasant 54 yo R woman with a history of anxiety, depression with severe psychotic features, paranoid schizophrenia, who presented for dizziness and right-sided paresthesias. She has been to the ER recently reporting "seizures and convulsions," however on further questioning describes the sensation as an internal sensation of convulsions in her abdominal area. There is no motor component, no jerking/twitching or loss of consciousness. She was reassured today about her normal MRI brain and EEG, I discussed with her that there  is no neurological cause for her symptoms, we discussed how stress can also cause similar symptoms. She will discuss abdominal symptoms with her PCP. She is homeless and needs a Education officer, museum to help her navigate her situation, hindered due to her lack of insight due to underlying psychiatric condition. Follow-up prn.   Follow Up Instructions:   -I discussed the assessment and treatment plan with the patient. The patient was provided an opportunity to ask questions and all were answered. The patient agreed with the plan and demonstrated an understanding of the instructions.   The patient was advised to call back or seek an in-person evaluation if the symptoms worsen or if the condition fails to improve as anticipated.    Total Time spent in visit with the patient was:  13:04 minutes, of  which 100% of the time was spent in counseling and/or coordinating care on the above.   Pt understands and agrees with the plan of care outlined.     Cameron Sprang, MD

## 2019-12-16 NOTE — Telephone Encounter (Signed)
Patient left a message on the VM returning [place] call

## 2019-12-16 NOTE — Telephone Encounter (Signed)
Patient wants to speak to some one about her imaging at [facility], she got the a MRI done and got the wrong discharge summary and cyber stalked and now she is wondering if the imaging was cyber stalked as well. She is still having problems.  She was just seen today by a virtual visit  Please call

## 2019-12-16 NOTE — Telephone Encounter (Signed)
Pt called no answer voice mail left to call back  °

## 2019-12-16 NOTE — Telephone Encounter (Signed)
Pt called no answer VM left

## 2019-12-16 NOTE — Telephone Encounter (Signed)
Noted, in that case pls have her speak to her PCP about referral to another neurologist for a second opinion, there is nothing further I can offer. Thanks

## 2019-12-19 NOTE — Telephone Encounter (Signed)
My chart message was sent to pt,

## 2019-12-20 ENCOUNTER — Other Ambulatory Visit: Payer: Self-pay

## 2019-12-20 ENCOUNTER — Encounter (HOSPITAL_BASED_OUTPATIENT_CLINIC_OR_DEPARTMENT_OTHER): Payer: Self-pay | Admitting: *Deleted

## 2019-12-20 ENCOUNTER — Emergency Department (HOSPITAL_BASED_OUTPATIENT_CLINIC_OR_DEPARTMENT_OTHER)
Admission: EM | Admit: 2019-12-20 | Discharge: 2019-12-21 | Disposition: A | Discharge status: Home / Self Care | Payer: Medicare Other | Attending: Emergency Medicine | Admitting: Emergency Medicine

## 2019-12-20 DIAGNOSIS — F410 Panic disorder [episodic paroxysmal anxiety] without agoraphobia: Secondary | ICD-10-CM | POA: Insufficient documentation

## 2019-12-20 DIAGNOSIS — F329 Major depressive disorder, single episode, unspecified: Secondary | ICD-10-CM | POA: Diagnosis not present

## 2019-12-20 DIAGNOSIS — Z590 Homelessness: Secondary | ICD-10-CM | POA: Diagnosis not present

## 2019-12-20 DIAGNOSIS — Z046 Encounter for general psychiatric examination, requested by authority: Secondary | ICD-10-CM | POA: Diagnosis not present

## 2019-12-20 DIAGNOSIS — Z8659 Personal history of other mental and behavioral disorders: Secondary | ICD-10-CM | POA: Insufficient documentation

## 2019-12-20 DIAGNOSIS — Z79899 Other long term (current) drug therapy: Secondary | ICD-10-CM | POA: Insufficient documentation

## 2019-12-20 DIAGNOSIS — I10 Essential (primary) hypertension: Secondary | ICD-10-CM | POA: Diagnosis not present

## 2019-12-20 DIAGNOSIS — F200 Paranoid schizophrenia: Secondary | ICD-10-CM | POA: Insufficient documentation

## 2019-12-20 DIAGNOSIS — E119 Type 2 diabetes mellitus without complications: Secondary | ICD-10-CM | POA: Diagnosis not present

## 2019-12-20 DIAGNOSIS — F419 Anxiety disorder, unspecified: Secondary | ICD-10-CM | POA: Diagnosis not present

## 2019-12-20 DIAGNOSIS — R457 State of emotional shock and stress, unspecified: Secondary | ICD-10-CM | POA: Diagnosis not present

## 2019-12-20 LAB — URINALYSIS, ROUTINE W REFLEX MICROSCOPIC
Bilirubin Urine: NEGATIVE
Glucose, UA: NEGATIVE mg/dL
Hgb urine dipstick: NEGATIVE
Ketones, ur: NEGATIVE mg/dL
Leukocytes,Ua: NEGATIVE
Nitrite: NEGATIVE
Protein, ur: NEGATIVE mg/dL
Specific Gravity, Urine: >1.03 — ABNORMAL HIGH (ref 1.005–1.030)
pH: 6 (ref 5.0–8.0)

## 2019-12-20 LAB — RAPID URINE DRUG SCREEN, HOSP PERFORMED
Amphetamines: NOT DETECTED
Barbiturates: NOT DETECTED
Benzodiazepines: NOT DETECTED
Cocaine: NOT DETECTED
Opiates: NOT DETECTED
Tetrahydrocannabinol: NOT DETECTED

## 2019-12-20 LAB — CBC WITH DIFFERENTIAL/PLATELET
Abs Immature Granulocytes: 0.01 10*3/uL (ref 0.00–0.07)
Basophils Absolute: 0 10*3/uL (ref 0.0–0.1)
Basophils Relative: 0 %
Eosinophils Absolute: 0.2 10*3/uL (ref 0.0–0.5)
Eosinophils Relative: 3 %
HCT: 35.3 % — ABNORMAL LOW (ref 36.0–46.0)
Hemoglobin: 11 g/dL — ABNORMAL LOW (ref 12.0–15.0)
Immature Granulocytes: 0 %
Lymphocytes Relative: 32 %
Lymphs Abs: 1.9 10*3/uL (ref 0.7–4.0)
MCH: 26.3 pg (ref 26.0–34.0)
MCHC: 31.2 g/dL (ref 30.0–36.0)
MCV: 84.4 fL (ref 80.0–100.0)
Monocytes Absolute: 0.3 10*3/uL (ref 0.1–1.0)
Monocytes Relative: 4 %
Neutro Abs: 3.6 10*3/uL (ref 1.7–7.7)
Neutrophils Relative %: 61 %
Platelets: 247 10*3/uL (ref 150–400)
RBC: 4.18 MIL/uL (ref 3.87–5.11)
RDW: 13.8 % (ref 11.5–15.5)
WBC: 5.9 10*3/uL (ref 4.0–10.5)
nRBC: 0 % (ref 0.0–0.2)

## 2019-12-20 LAB — COMPREHENSIVE METABOLIC PANEL
ALT: 15 U/L (ref 0–44)
AST: 16 U/L (ref 15–41)
Albumin: 3.4 g/dL — ABNORMAL LOW (ref 3.5–5.0)
Alkaline Phosphatase: 95 U/L (ref 38–126)
Anion gap: 9 (ref 5–15)
BUN: 14 mg/dL (ref 6–20)
CO2: 23 mmol/L (ref 22–32)
Calcium: 8.7 mg/dL — ABNORMAL LOW (ref 8.9–10.3)
Chloride: 110 mmol/L (ref 98–111)
Creatinine, Ser: 0.93 mg/dL (ref 0.44–1.00)
GFR calc Af Amer: >60 mL/min (ref >60)
GFR calc non Af Amer: >60 mL/min (ref >60)
Glucose, Bld: 148 mg/dL — ABNORMAL HIGH (ref 70–99)
Potassium: 3.6 mmol/L (ref 3.5–5.1)
Sodium: 142 mmol/L (ref 135–145)
Total Bilirubin: 0.1 mg/dL — ABNORMAL LOW (ref 0.3–1.2)
Total Protein: 6.6 g/dL (ref 6.5–8.1)

## 2019-12-20 LAB — ETHANOL: Alcohol, Ethyl (B): <10 mg/dL (ref <10)

## 2019-12-20 LAB — PREGNANCY, URINE: Preg Test, Ur: NEGATIVE

## 2019-12-20 MED ORDER — LORAZEPAM 1 MG PO TABS
1.0000 mg | ORAL_TABLET | Freq: Once | ORAL | Status: AC
  Administered 2019-12-20: 1 mg via ORAL
  Filled 2019-12-20: qty 1

## 2019-12-20 MED ORDER — LORAZEPAM 1 MG PO TABS: 1.0000 mg | ORAL_TABLET | Freq: Three times a day (TID) | ORAL | 0 refills | Status: DC | PRN

## 2019-12-20 NOTE — ED Notes (Signed)
Presents to ED requesting something for anxiety and nausea. Pt states she has been harassed at work lately, has been living her car, is now unemployed, will experience panic and anxiety attacks, states she gets a headache and has racing heart and some nausea. She states to the nurse she did not have any plan or has thought of self harm or harm to anyone else. Was very talkative, calm and cooperative with nursing staff at this time. Comfort measures provided.

## 2019-12-20 NOTE — ED Notes (Signed)
Patient is resting comfortably.TV remote given

## 2019-12-20 NOTE — BH Assessment (Signed)
Tele Assessment Note   Patient Name: [name] MRN: [id number] Referring Physician: Dr. Fredia Sorrow Location of Patient: [facility] Location of Provider: [name] is an 54 y.o. female presenting to the ED voluntarily with complaints of increased anxiety and panic "attacks due to severe, chronic, obsessive harassment". When asked about harrassment, patient stated, "I was having a panic attack, going through harrassment situations and now I am homeless, maybe that's it". Patient reported [name] at [facility] was harassing her" and that during other times there are people that she knows that relating to the event were invited. Patient denied SI, HI, psychosis and alcohol/drug usage. Patient was last inpatient for delusions 06/2019 at [facility]. Patient denied receiving any outpatient treatment services at this time. Patient did report being seen at [facility] at some point, timeframe unknown.   Patient reported getting permission from [name] to stay parked in their parking lot and that she could use their bathrooms to take "bird baths". During assessment patient was not forthcoming with information and continued to repeat the following "I am going through a harrassment situation". Patient also denied collateral contact.  PER TRIAGE NOTE: To ED via EMS. EMS states pt called with c.o anxiety. One minute she wants transport followed by immediate refusal for transport.  On arrival pt is accusatory toward registration staff that she has a mean face that reminds her of someone harassing her. At triage pt is cooperative but request evaluation followed by immediate refusal for treatment. She states she started living in her car due to harrassment at home. States she is having a panic attack and anxiety. She request a taxi voucher so she can leave.   Diagnosis: Major depressive disorder  Past Medical History:  Past  Medical History:  Diagnosis Date  . Anxiety   . Delusional disorder ([place])   . Diabetes mellitus without complication ([place])   . Homelessness   . Hypertension   . Major depressive disorder, recurrent, severe with psychotic features ([place])   . Overdose   . Paranoid schizophrenia ([place])   . Schizophrenia ([place])   . Tremors of nervous system     History reviewed. No pertinent surgical history.  Family History:  Family History  Problem Relation Age of Onset  . Hypertension Mother   . Diabetes Mother   . Aneurysm Mother   . Hypertension Father   . Diabetes Father   . Hypertension Sister   . Diabetes Sister   . Hypertension Other   . Diabetes Other     Social History:  reports that she has never smoked. She has never used smokeless tobacco. She reports that she does not drink alcohol or use drugs.  Additional Social History:  Alcohol / Drug Use Pain Medications: see MAR Prescriptions: see MAR Over the Counter: see MAR  CIWA: CIWA-Ar BP: (!) 146/99 Pulse Rate: 83 COWS:    Allergies: No Known Allergies  Home Medications: (Not in a hospital admission)   OB/GYN Status:  Patient's last menstrual period was 10/29/2011.  General Assessment Data Location of Assessment: [facility] TTS Assessment: In system Is this a Tele or Face-to-Face Assessment?: Tele Assessment Is this an Initial Assessment or a Re-assessment for this encounter?: Initial Assessment Patient Accompanied by:: N/A Language Other than English: No Living Arrangements: Homeless/Shelter What gender do you identify as?: Female Marital status: Single Pregnancy Status: Unknown Living Arrangements: Alone, Other (Comment)(lives in her car) Can pt return  to current living arrangement?: Yes Admission Status: Voluntary Is patient capable of signing voluntary admission?: Yes Referral Source: Self/Family/Friend     Crisis Care Plan Living Arrangements: Alone, Other (Comment)(lives in her car) Legal  Guardian: Other:(self) Name of Psychiatrist: (none) Name of Therapist: (none)  Education Status Is patient currently in school?: No Is the patient employed, unemployed or receiving disability?: Unemployed  Risk to self with the past 6 months Suicidal Ideation: No Has patient been a risk to self within the past 6 months prior to admission? : No Suicidal Intent: No Has patient had any suicidal intent within the past 6 months prior to admission? : No Is patient at risk for suicide?: No Suicidal Plan?: No Has patient had any suicidal plan within the past 6 months prior to admission? : No Access to Means: No What has been your use of drugs/alcohol within the last 12 months?: (none) Previous Attempts/Gestures: No How many times?: (0) Other Self Harm Risks: (none) Triggers for Past Attempts: (n/a) Intentional Self Injurious Behavior: None Family Suicide History: No Recent stressful life event(s): ("harrassment situation") Persecutory voices/beliefs?: No Depression: Yes Depression Symptoms: Insomnia, Tearfulness, Isolating, Fatigue, Guilt, Loss of interest in usual pleasures, Feeling worthless/self pity Substance abuse history and/or treatment for substance abuse?: No Suicide prevention information given to non-admitted patients: Not applicable  Risk to Others within the past 6 months Homicidal Ideation: No Does patient have any lifetime risk of violence toward others beyond the six months prior to admission? : No Thoughts of Harm to Others: No Current Homicidal Intent: No Current Homicidal Plan: No Access to Homicidal Means: No History of harm to others?: No Assessment of Violence: None Noted Violent Behavior Description: (none reported) Does patient have access to weapons?: No Criminal Charges Pending?: No Does patient have a court date: No Is patient on probation?: No  Psychosis Hallucinations: None noted Delusions: Unspecified  Mental Status Report Appearance/Hygiene:  Unremarkable Eye Contact: Fair Motor Activity: Freedom of movement Speech: Logical/coherent Level of Consciousness: Alert Mood: Depressed, Anxious Affect: Sad, Depressed Anxiety Level: Moderate Thought Processes: Irrelevant Judgement: Partial Orientation: Person, Place, Time, Situation Obsessive Compulsive Thoughts/Behaviors: Severe  Cognitive Functioning Concentration: Fair Memory: Recent Intact Is patient IDD: No Insight: Poor Impulse Control: Fair Appetite: Good Have you had any weight changes? : No Change Sleep: No Change Total Hours of Sleep: ("not sure") Vegetative Symptoms: None  ADLScreening [facility] Assessment Services) Patient's cognitive ability adequate to safely complete daily activities?: Yes Patient able to express need for assistance with ADLs?: Yes Independently performs ADLs?: Yes (appropriate for developmental age)  Prior Inpatient Therapy Prior Inpatient Therapy: Yes Prior Therapy Dates: (06/2019) [facility]ilty[facility] Reason for Treatment: (delusions)  Prior Outpatient Therapy Prior Outpatient Therapy: No Does patient have an ACCT team?: No Does patient have Intensive In-House Services?  : No Does patient have [facility] services? : No Does patient have P4CC services?: No  ADL Screening (condition at time of admission) Patient's cognitive ability adequate to safely complete daily activities?: Yes Patient able to express need for assistance with ADLs?: Yes Independently performs ADLs?: Yes (appropriate for developmental age)  Regulatory affairs officer (For Healthcare) Does Patient Have a Medical Advance Directive?: No   Disposition:  Disposition Initial Assessment Completed for this Encounter: Yes  Adaku Anike, NP, patient does not meet inpatient criteria. Patient to follow up with outpatient services at [facility].  This service was provided via telemedicine using a 2-way, interactive audio and video  technology.  Names  of all persons participating in this telemedicine service and their role in this encounter. Name: [name] Role: Patient  Name: Kirtland Bouchard Role: TTS Clinician  Name:  Role:   Name:  Role:     Venora Maples 12/20/2019 10:34 PM

## 2019-12-20 NOTE — ED Triage Notes (Signed)
To ED via EMS. EMS states pt called with c.o anxiety. One minute she wants transport followed by immediate refusal for transport.  On arrival pt is accusatory toward registration staff that she has a mean face that reminds her of someone harassing her. At triage pt is cooperative but request evaluation followed by immediate refusal for treatment. She states she started living in her car due to harrassment at home. States she is having a panic attack and anxiety. She request a taxi voucher so she can leave.

## 2019-12-20 NOTE — Discharge Instructions (Addendum)
Cleared by behavioral health for discharge home.  The recommending family services follow-up.  Short course of Ativan will be provided.
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
## 2019-12-20 NOTE — ED Notes (Signed)
Patient denies pain and is resting comfortably.  

## 2019-12-20 NOTE — ED Notes (Signed)
Patient is resting comfortably. 

## 2019-12-22 ENCOUNTER — Telehealth: Payer: Self-pay | Admitting: Neurology

## 2019-12-22 NOTE — Telephone Encounter (Signed)
Patient called in regarding her MRI results and her still feeling like some thing is wrong Neurologically. She would like you to please call her. Thank you

## 2019-12-22 NOTE — Telephone Encounter (Signed)
Pt called back no answer voice mail was full

## 2019-12-25 ENCOUNTER — Other Ambulatory Visit: Payer: Self-pay

## 2019-12-25 ENCOUNTER — Encounter (HOSPITAL_BASED_OUTPATIENT_CLINIC_OR_DEPARTMENT_OTHER): Payer: Self-pay

## 2019-12-25 ENCOUNTER — Emergency Department (HOSPITAL_BASED_OUTPATIENT_CLINIC_OR_DEPARTMENT_OTHER)
Admission: EM | Admit: 2019-12-25 | Discharge: 2019-12-26 | Disposition: A | Discharge status: Home / Self Care | Payer: Medicare Other | Attending: Emergency Medicine | Admitting: Emergency Medicine

## 2019-12-25 DIAGNOSIS — Z590 Homelessness: Secondary | ICD-10-CM | POA: Insufficient documentation

## 2019-12-25 DIAGNOSIS — F22 Delusional disorders: Secondary | ICD-10-CM | POA: Diagnosis not present

## 2019-12-25 DIAGNOSIS — Z20822 Contact with and (suspected) exposure to covid-19: Secondary | ICD-10-CM | POA: Insufficient documentation

## 2019-12-25 DIAGNOSIS — Z03818 Encounter for observation for suspected exposure to other biological agents ruled out: Secondary | ICD-10-CM | POA: Diagnosis not present

## 2019-12-25 DIAGNOSIS — E119 Type 2 diabetes mellitus without complications: Secondary | ICD-10-CM | POA: Insufficient documentation

## 2019-12-25 DIAGNOSIS — F200 Paranoid schizophrenia: Secondary | ICD-10-CM | POA: Insufficient documentation

## 2019-12-25 DIAGNOSIS — I10 Essential (primary) hypertension: Secondary | ICD-10-CM | POA: Insufficient documentation

## 2019-12-25 LAB — COMPREHENSIVE METABOLIC PANEL
ALT: 17 U/L (ref 0–44)
AST: 18 U/L (ref 15–41)
Albumin: 3.5 g/dL (ref 3.5–5.0)
Alkaline Phosphatase: 105 U/L (ref 38–126)
Anion gap: 8 (ref 5–15)
BUN: 17 mg/dL (ref 6–20)
CO2: 22 mmol/L (ref 22–32)
Calcium: 8.9 mg/dL (ref 8.9–10.3)
Chloride: 108 mmol/L (ref 98–111)
Creatinine, Ser: 0.78 mg/dL (ref 0.44–1.00)
GFR calc Af Amer: >60 mL/min (ref >60)
GFR calc non Af Amer: >60 mL/min (ref >60)
Glucose, Bld: 143 mg/dL — ABNORMAL HIGH (ref 70–99)
Potassium: 3.8 mmol/L (ref 3.5–5.1)
Sodium: 138 mmol/L (ref 135–145)
Total Bilirubin: <0.1 mg/dL — ABNORMAL LOW (ref 0.3–1.2)
Total Protein: 6.9 g/dL (ref 6.5–8.1)

## 2019-12-25 LAB — RAPID URINE DRUG SCREEN, HOSP PERFORMED
Amphetamines: NOT DETECTED
Barbiturates: NOT DETECTED
Benzodiazepines: NOT DETECTED
Cocaine: NOT DETECTED
Opiates: NOT DETECTED
Tetrahydrocannabinol: NOT DETECTED

## 2019-12-25 LAB — CBC WITH DIFFERENTIAL/PLATELET
Abs Immature Granulocytes: 0.03 10*3/uL (ref 0.00–0.07)
Basophils Absolute: 0 10*3/uL (ref 0.0–0.1)
Basophils Relative: 1 %
Eosinophils Absolute: 0.2 10*3/uL (ref 0.0–0.5)
Eosinophils Relative: 3 %
HCT: 35.8 % — ABNORMAL LOW (ref 36.0–46.0)
Hemoglobin: 11 g/dL — ABNORMAL LOW (ref 12.0–15.0)
Immature Granulocytes: 1 %
Lymphocytes Relative: 32 %
Lymphs Abs: 2.1 10*3/uL (ref 0.7–4.0)
MCH: 26.1 pg (ref 26.0–34.0)
MCHC: 30.7 g/dL (ref 30.0–36.0)
MCV: 85 fL (ref 80.0–100.0)
Monocytes Absolute: 0.3 10*3/uL (ref 0.1–1.0)
Monocytes Relative: 4 %
Neutro Abs: 4 10*3/uL (ref 1.7–7.7)
Neutrophils Relative %: 59 %
Platelets: 283 10*3/uL (ref 150–400)
RBC: 4.21 MIL/uL (ref 3.87–5.11)
RDW: 13.9 % (ref 11.5–15.5)
WBC: 6.6 10*3/uL (ref 4.0–10.5)
nRBC: 0 % (ref 0.0–0.2)

## 2019-12-25 LAB — ETHANOL: Alcohol, Ethyl (B): <10 mg/dL (ref <10)

## 2019-12-25 LAB — SALICYLATE LEVEL: Salicylate Lvl: <7 mg/dL — ABNORMAL LOW (ref 7.0–30.0)

## 2019-12-25 LAB — ACETAMINOPHEN LEVEL: Acetaminophen (Tylenol), Serum: <10 ug/mL — ABNORMAL LOW (ref 10–30)

## 2019-12-25 MED ORDER — DIPHENHYDRAMINE HCL 25 MG PO CAPS
50.0000 mg | ORAL_CAPSULE | Freq: Once | ORAL | Status: AC
  Administered 2019-12-25: 50 mg via ORAL
  Filled 2019-12-25: qty 2

## 2019-12-25 NOTE — ED Notes (Signed)
Pt screaming out in the room. Went in to speak with pt. She is calm while I am in the room. States she "flunked her Phd and they need to be arrested"  Requesting something to help her rest. Dr. Dina Rich aware and orders received. Offered pt something to eat and drink. Ice water given per her request.
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
## 2019-12-25 NOTE — ED Notes (Signed)
Attempted to update medlist.  Patient unwilling to answer questions.  States I don't know anything.  Yelling out at times.  When questioned reports I' don't know they are going to be arrested.  Medicated with benadryl for sleep.  Snack and drinks provided.  Encouraged to call for assistance as needed.

## 2019-12-25 NOTE — ED Provider Notes (Signed)
Patient signed out pending behavioral health assessment.  11:43 PM Spoke with behavioral health.  Recommend reassessment in the morning.  Also recommend social work evaluation.  Discussed with behavioral health that we do not have social work available.  They have acknowledged this and will have their social worker touch base with the patient.  Patient continues to be very labile and is yelling out at times.  She is requesting something for sleep.  Patient given Benadryl.   Merryl Hacker, MD 12/25/19 [phone]

## 2019-12-25 NOTE — ED Notes (Signed)
TTS assessment complete.
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
## 2019-12-26 ENCOUNTER — Inpatient Hospital Stay (HOSPITAL_COMMUNITY)
Admission: AD | Admit: 2019-12-26 | Discharge: 2019-12-29 | DRG: 885 | Disposition: A | Discharge status: Home / Self Care | Payer: Medicare Other | Source: Intra-hospital | Attending: Psychiatry | Admitting: Psychiatry

## 2019-12-26 ENCOUNTER — Encounter (HOSPITAL_COMMUNITY): Payer: Self-pay | Admitting: Psychiatry

## 2019-12-26 DIAGNOSIS — Z590 Homelessness: Secondary | ICD-10-CM

## 2019-12-26 DIAGNOSIS — Z833 Family history of diabetes mellitus: Secondary | ICD-10-CM

## 2019-12-26 DIAGNOSIS — Z9119 Patient's noncompliance with other medical treatment and regimen: Secondary | ICD-10-CM

## 2019-12-26 DIAGNOSIS — F200 Paranoid schizophrenia: Secondary | ICD-10-CM | POA: Diagnosis present

## 2019-12-26 DIAGNOSIS — Z8249 Family history of ischemic heart disease and other diseases of the circulatory system: Secondary | ICD-10-CM

## 2019-12-26 DIAGNOSIS — I10 Essential (primary) hypertension: Secondary | ICD-10-CM | POA: Diagnosis present

## 2019-12-26 LAB — RESPIRATORY PANEL BY RT PCR (FLU A&B, COVID)
Influenza A by PCR: NEGATIVE
Influenza B by PCR: NEGATIVE
SARS Coronavirus 2 by RT PCR: NEGATIVE

## 2019-12-26 MED ORDER — GABAPENTIN 300 MG PO CAPS
300.0000 mg | ORAL_CAPSULE | Freq: Three times a day (TID) | ORAL | Status: DC
  Administered 2019-12-26 – 2019-12-29 (×10): 300 mg via ORAL
  Filled 2019-12-26 (×17): qty 1

## 2019-12-26 MED ORDER — GABAPENTIN 300 MG PO CAPS
300.0000 mg | ORAL_CAPSULE | Freq: Three times a day (TID) | ORAL | Status: DC
  Administered 2019-12-26: 300 mg via ORAL
  Filled 2019-12-26: qty 1

## 2019-12-26 MED ORDER — IBUPROFEN 600 MG PO TABS
600.0000 mg | ORAL_TABLET | Freq: Four times a day (QID) | ORAL | Status: DC | PRN
  Administered 2019-12-26 – 2019-12-28 (×3): 600 mg via ORAL
  Filled 2019-12-26 (×3): qty 1

## 2019-12-26 MED ORDER — MIDAZOLAM HCL 5 MG/5ML IJ SOLN
5.0000 mg | Freq: Once | INTRAMUSCULAR | Status: DC
  Filled 2019-12-26: qty 5
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
## 2019-12-26 NOTE — Progress Notes (Addendum)
Pt accepted to [facility] Unit; 502-01.      Mordecai Maes, NP is the accepting provider.    Dr. Jake Samples is the attending provider.    Call report to [phone].    Adrienne @ [place] notified.     Pt is IVC.    Pt may be transported by Event organiser.   Pt scheduled to arrive as soon as possible.   Domenic Schwab, MSW, LCSW-A Clinical Disposition Social Worker [facility]/TTS [phone]

## 2019-12-26 NOTE — ED Notes (Signed)
Pt aware that TTS will be speaking with her again this morning. Offered drink and/or food. Pt declined.

## 2019-12-26 NOTE — ED Notes (Signed)
Spoke to [facility] staff at [phone] re: when TTS eval will happen; staff confirms pt is on the list to assess, but the person who does assessments is currently with another pt.

## 2019-12-26 NOTE — ED Notes (Signed)
Pt left cooperatively with law enforcement (HPP[employer])

## 2019-12-26 NOTE — ED Provider Notes (Signed)
  Provider Note MRN:  [id number]  Arrival date & time: 12/26/19    ED Course and Medical Decision Making  Assumed care from Dr. Dina Rich at shift change.  Patient was reevaluated by TTS and inpatient management was recommended.  Concern for continued delusions in the setting of schizophrenia.  Patient was making threats of wanting to leave.  IVC paperwork is complete, she now agrees to remain calm and have a mental health evaluation completed.  Signed out to default provider.  Placement in process, if still awaiting placement by morning of 12-27-2019, would consider [place] [facility] transfer.  Procedures  Final Clinical Impressions(s) / ED Diagnoses     ICD-10-CM   1. Paranoia [facility])  [place]     ED Discharge Orders    None      Discharge Instructions   None     Barth Kirks. Sedonia Small, [place] [email or web address]    Maudie Flakes, MD 12/26/19 [phone]

## 2019-12-26 NOTE — ED Notes (Signed)
Plan to obtain Resp Panel once IVC is complete.

## 2019-12-26 NOTE — Tx Team (Signed)
Initial Treatment Plan 12/26/2019 6:27 PM Adelaine Ficke [id number]    PATIENT STRESSORS: Financial difficulties Health problems Medication change or noncompliance Traumatic event   PATIENT STRENGTHS: Capable of independent living Armed forces logistics/support/administrative officer Motivation for treatment/growth Religious Affiliation   PATIENT IDENTIFIED PROBLEMS: delusions  paranoia  anxiety  "fearing for my life"               DISCHARGE CRITERIA:  Ability to meet basic life and health needs Adequate post-discharge living arrangements Improved stabilization in mood, thinking, and/or behavior  PRELIMINARY DISCHARGE PLAN: Outpatient therapy Placement in alternative living arrangements  PATIENT/FAMILY INVOLVEMENT: This treatment plan has been presented to and reviewed with the patient, Wealtha Domingo.  The patient and family have been given the opportunity to ask questions and make suggestions.  Baron Sane, RN 12/26/2019, 6:27 PM

## 2019-12-26 NOTE — ED Notes (Addendum)
Offered pt something to eat or drink. Did not want anything. Requesting Gabapentin. MD aware and medication will be ordered.
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
## 2019-12-26 NOTE — ED Notes (Signed)
Resting quietly in bed at this time.  Breathing easy and non-labored.

## 2019-12-26 NOTE — ED Notes (Signed)
Report received 
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
## 2019-12-26 NOTE — Progress Notes (Signed)
   12/26/19 2000  Psych Admission Type (Psych Patients Only)  Admission Status Involuntary  Psychosocial Assessment  Patient Complaints Anxiety;Confusion  Eye Contact Fair  Facial Expression Animated;Anxious;Pensive  Affect Anxious;Appropriate to circumstance;Preoccupied  Speech Logical/coherent;Pressured  Interaction Assertive  Motor Activity Fidgety  Appearance/Hygiene Unremarkable  Behavior Characteristics Cooperative  Mood Anxious;Preoccupied  Thought Process  Coherency Tangential  Content Confabulation;Delusions;Preoccupation;Paranoia  Delusions Paranoid  Perception Derealization  Hallucination None reported or observed  Judgment Poor  Confusion Mild  Danger to Self  Current suicidal ideation? Denies  Danger to Others  Danger to Others None reported or observed

## 2019-12-26 NOTE — ED Notes (Signed)
Pt is talking to SW on the phone

## 2019-12-26 NOTE — ED Notes (Signed)
TTS consult in progress. °

## 2019-12-27 LAB — LIPID PANEL
Cholesterol: 226 mg/dL — ABNORMAL HIGH (ref 0–200)
HDL: 49 mg/dL (ref >40)
LDL Cholesterol: 159 mg/dL — ABNORMAL HIGH (ref 0–99)
Total CHOL/HDL Ratio: 4.6 RATIO
Triglycerides: 88 mg/dL (ref <150)
VLDL: 18 mg/dL (ref 0–40)

## 2019-12-27 LAB — TSH: TSH: 2.712 u[IU]/mL (ref 0.350–4.500)

## 2019-12-27 LAB — HEMOGLOBIN A1C
Hgb A1c MFr Bld: 6.3 % — ABNORMAL HIGH (ref 4.8–5.6)
Mean Plasma Glucose: 134.11 mg/dL

## 2019-12-27 MED ORDER — RISPERIDONE 3 MG PO TABS
3.0000 mg | ORAL_TABLET | Freq: Two times a day (BID) | ORAL | Status: DC
  Administered 2019-12-27 – 2019-12-29 (×5): 3 mg via ORAL
  Filled 2019-12-27 (×8): qty 1

## 2019-12-27 MED ORDER — TEMAZEPAM 30 MG PO CAPS
30.0000 mg | ORAL_CAPSULE | Freq: Every day | ORAL | Status: DC
  Administered 2019-12-27 – 2019-12-28 (×2): 30 mg via ORAL
  Filled 2019-12-27 (×2): qty 1

## 2019-12-27 MED ORDER — BENZTROPINE MESYLATE 0.5 MG PO TABS
0.5000 mg | ORAL_TABLET | Freq: Two times a day (BID) | ORAL | Status: DC
  Administered 2019-12-27 – 2019-12-29 (×5): 0.5 mg via ORAL
  Filled 2019-12-27 (×8): qty 1

## 2019-12-27 NOTE — BHH Suicide Risk Assessment (Signed)
[place] INPATIENT:  Family/Significant Other Suicide Prevention Education  Suicide Prevention Education:  Patient Refusal for Family/Significant Other Suicide Prevention Education: The patient [name] has refused to provide written consent for family/significant other to be provided Family/Significant Other Suicide Prevention Education during admission and/or prior to discharge.  Physician notified.  Joellen Jersey 12/27/2019, 2:08 PM

## 2019-12-27 NOTE — Progress Notes (Signed)
Recreation Therapy Notes  Animal-Assisted Activity (AAA) Program Checklist/Progress Notes Patient Eligibility Criteria Checklist & Daily Group note for Rec Tx Intervention  Date: 3.2.21 Time: 1430 Location: [address]   AAA/T Program Assumption of Risk Form signed by Teacher, music or Parent Legal Guardian  YES   Patient is free of allergies or sever asthma  YES   Patient reports no fear of animals  YES   Patient reports no history of cruelty to animals YES   Patient understands his/her participation is voluntary  YES   Patient washes hands before animal contact  YES  Patient washes hands after animal contact  YES   Education: Contractor, Appropriate Animal Interaction   Education Outcome: Acknowledges understanding/In group clarification offered/Needs additional education.   Clinical Observations/Feedback:  Pt did not attend activity.   [name], LRT/CTRS         [name] A 12/27/2019 3:46 PM

## 2019-12-27 NOTE — Progress Notes (Signed)
Pt denies HI/SI/AVH.  Presents with paranoia and illogical thought content.  Pt taking medications as prescribed and responds well to redirection and reassurance.  RN will continue to monitor and provide support as needed.

## 2019-12-27 NOTE — BHH Counselor (Signed)
CSW has attempted to reach the [employer] at the request of the patient. Patient asked CSW to get in contact with a Ms.Alroy Dust. There is not a "Ms.Alroy Dust" in [employer].  Stephanie Acre, MSW, [place] Social Worker [facility]  [phone]

## 2019-12-27 NOTE — Progress Notes (Signed)
Recreation Therapy Notes  Date: 3.2.21 Time: 1000 Location: [address] Day[address]  Group Topic: Wellness  Goal Area(s) Addresses:  Patient will define components of whole wellness. Patient will verbalize benefit of whole wellness.  Intervention: Music   Activity:  Exercise.  LRT and patients engaged in a series of exercises to get relaxed and loose.  Patients were to engage in the exercises but not to the point of strain.  Patients could also take breaks as needed.  Education:Wellness, Discharge Planning.   Education Outcome: Acknowledges education/In group clarification offered/Needs additional education.   Clinical Observations/Feedback: Pt did not attend group session.    [name], LRT/CTRS         [name] A 12/27/2019 11:29 AM

## 2019-12-27 NOTE — Progress Notes (Signed)
   12/27/19 2000  Psych Admission Type (Psych Patients Only)  Admission Status Involuntary  Psychosocial Assessment  Patient Complaints Anxiety  Eye Contact Fair  Facial Expression Animated;Anxious;Pensive  Affect Anxious;Appropriate to circumstance;Preoccupied  Speech Logical/coherent;Pressured  Interaction Assertive  Motor Activity Fidgety  Appearance/Hygiene Unremarkable  Behavior Characteristics Cooperative  Mood Anxious  Thought Process  Coherency Tangential  Content Preoccupation;Paranoia  Delusions Paranoid  Perception Derealization  Hallucination None reported or observed  Judgment Poor  Confusion Mild  Danger to Self  Current suicidal ideation? Denies  Danger to Others  Danger to Others None reported or observed

## 2019-12-27 NOTE — BHH Suicide Risk Assessment (Signed)
[facility] Admission Suicide Risk Assessment   Nursing information obtained from:  Patient Demographic factors:  Living alone, Unemployed, Low socioeconomic status Current Mental Status:  NA Loss Factors:  Financial problems / change in socioeconomic status, Decline in physical health Historical Factors:  Impulsivity, Family history of mental illness or substance abuse Risk Reduction Factors:  Religious beliefs about death  Total Time spent with patient: 45 minutes Principal Problem: Chronic under/untreated schizophrenia Diagnosis:  Active Problems:   Paranoid schizophrenia ([place])  Subjective Data: Presents with continued paranoid delusions  Continued Clinical Symptoms:  Alcohol Use Disorder Identification Test Final Score (AUDIT): 0 The "Alcohol Use Disorders Identification Test", Guidelines for Use in Primary Care, Second Edition.  World Pharmacologist [facility]). Score between 0-7:  no or low risk or alcohol related problems. Score between 8-15:  moderate risk of alcohol related problems. Score between 16-19:  high risk of alcohol related problems. Score 20 or above:  warrants further diagnostic evaluation for alcohol dependence and treatment.   CLINICAL FACTORS:   Schizophrenia:   Paranoid or undifferentiated type  Musculoskeletal: Strength & Muscle Tone: within normal limits Gait & Station: normal Patient leans: N/A  Psychiatric Specialty Exam: Physical Exam  Nursing note and vitals reviewed. Constitutional: She appears well-developed and well-nourished.  Cardiovascular: Normal rate and regular rhythm.    Review of Systems  Constitutional: Negative.   Eyes: Negative.   Respiratory: Negative.   Endocrine: Negative.   Genitourinary: Negative.   Neurological: Negative.     Blood pressure (!) 135/107, pulse 88, temperature 97.9 F (36.6 C), temperature source Oral, resp. rate 20, height 5\' 5"  (1.651 m), weight 121.6 kg, last menstrual period 10/29/2011, SpO2 100 %.Body mass  index is 44.6 kg/m.  General Appearance: Casual  Eye Contact:  Good  Speech:  Clear and Coherent  Volume:  Increased  Mood:  Hypomanic  Affect:  Full Range  Thought Process:  Linear  Orientation:  Full (Time, Place, and Person)  Thought Content:  Illogical, Delusions, Paranoid Ideation and Rumination  Suicidal Thoughts:  No  Homicidal Thoughts:  No  Memory:  Immediate;   Fair Recent;   Fair Remote;   Fair  Judgement:  Impaired  Insight:  Lacking  Psychomotor Activity:  Normal  Concentration:  Concentration: Poor and Attention Span: Poor  Recall:  [employer] of Knowledge:  Fair  Language:  Fair  Akathisia:  Negative  Handed:  Right  AIMS (if indicated):     Assets:  Communication Skills Desire for Improvement  ADL's:  Intact  Cognition:  WNL  Sleep:  Number of Hours: 6   COGNITIVE FEATURES THAT CONTRIBUTE TO RISK:  Loss of executive function    SUICIDE RISK:   Minimal: No identifiable suicidal ideation.  Patients presenting with no risk factors but with morbid ruminations; may be classified as minimal risk based on the severity of the depressive symptoms  PLAN OF CARE: see eval   I certify that inpatient services furnished can reasonably be expected to improve the patient's condition.   Johnn Hai, MD 12/27/2019, 11:08 AM
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
## 2019-12-28 DIAGNOSIS — F200 Paranoid schizophrenia: Principal | ICD-10-CM

## 2019-12-28 LAB — PROLACTIN: Prolactin: 60.4 ng/mL — ABNORMAL HIGH (ref 4.8–23.3)

## 2019-12-28 NOTE — Tx Team (Signed)
Interdisciplinary Treatment and Diagnostic Plan Update  12/28/2019 Time of Session: 8:35am  [name] MRN: [id number]  Principal Diagnosis: <principal problem not specified>  Secondary Diagnoses: Active Problems:   Paranoid schizophrenia ([place])   Current Medications:  Current Facility-Administered Medications  Medication Dose Route Frequency Provider Last Rate Last Admin  . benztropine (COGENTIN) tablet 0.5 mg  0.5 mg Oral BID Johnn Hai, MD   0.5 mg at 12/28/19 0801  . gabapentin (NEURONTIN) capsule 300 mg  300 mg Oral TID Mordecai Maes, NP   300 mg at 12/28/19 0801  . ibuprofen (ADVIL) tablet 600 mg  600 mg Oral Q6H PRN Deloria Lair, NP   600 mg at 12/27/19 2108  . risperiDONE (RISPERDAL) tablet 3 mg  3 mg Oral BID Johnn Hai, MD   3 mg at 12/28/19 0801  . temazepam (RESTORIL) capsule 30 mg  30 mg Oral QHS Johnn Hai, MD   30 mg at 12/27/19 2108   PTA Medications: Medications Prior to Admission  Medication Sig Dispense Refill Last Dose  . gabapentin (NEURONTIN) 300 MG capsule Take 1 capsule (300 mg total) by mouth 3 (three) times daily. 90 capsule 0   . diazepam (VALIUM) 5 MG tablet Take 1 tablet (5 mg total) by mouth as directed. Take 1 tablet 30 minutes before MRI. May repeat if needed (Patient not taking: Reported on 12/14/2019) 2 tablet 0 Not Taking at Unknown time  . LORazepam (ATIVAN) 1 MG tablet Take 1 tablet (1 mg total) by mouth 3 (three) times daily as needed for anxiety. (Patient not taking: Reported on 12/27/2019) 15 tablet 0 Not Taking at Unknown time  . metoCLOPramide (REGLAN) 10 MG tablet Take 1 tablet (10 mg total) by mouth as needed for nausea. Take 1 tablet as needed for nausea and vomitting (Patient not taking: Reported on 12/27/2019) 30 tablet 0 Not Taking at Unknown time    Patient Stressors: Financial difficulties Health problems Medication change or noncompliance Traumatic event  Patient Strengths: Capable of independent living Medical illustrator for treatment/growth Religious Affiliation  Treatment Modalities: Medication Management, Group therapy, Case management,  1 to 1 session with clinician, Psychoeducation, Recreational therapy.   Physician Treatment Plan for Primary Diagnosis: <principal problem not specified> Long Term Goal(s): Improvement in symptoms so as ready for discharge Improvement in symptoms so as ready for discharge   Short Term Goals: Ability to identify changes in lifestyle to reduce recurrence of condition will improve Ability to verbalize feelings will improve Ability to disclose and discuss suicidal ideas Ability to identify changes in lifestyle to reduce recurrence of condition will improve Ability to verbalize feelings will improve Ability to demonstrate self-control will improve  Medication Management: Evaluate patient's response, side effects, and tolerance of medication regimen.  Therapeutic Interventions: 1 to 1 sessions, Unit Group sessions and Medication administration.  Evaluation of Outcomes: Not Met  Physician Treatment Plan for Secondary Diagnosis: Active Problems:   Paranoid schizophrenia ([place])  Long Term Goal(s): Improvement in symptoms so as ready for discharge Improvement in symptoms so as ready for discharge   Short Term Goals: Ability to identify changes in lifestyle to reduce recurrence of condition will improve Ability to verbalize feelings will improve Ability to disclose and discuss suicidal ideas Ability to identify changes in lifestyle to reduce recurrence of condition will improve Ability to verbalize feelings will improve Ability to demonstrate self-control will improve     Medication Management: Evaluate patient's response, side effects, and tolerance of medication regimen.  Therapeutic Interventions:  1 to 1 sessions, Unit Group sessions and Medication administration.  Evaluation of Outcomes: Not Met   RN Treatment Plan for Primary Diagnosis:  <principal problem not specified> Long Term Goal(s): Knowledge of disease and therapeutic regimen to maintain health will improve  Short Term Goals: Ability to participate in decision making will improve, Ability to verbalize feelings will improve, Ability to disclose and discuss suicidal ideas, Ability to identify and develop effective coping behaviors will improve and Compliance with prescribed medications will improve  Medication Management: RN will administer medications as ordered by provider, will assess and evaluate patient's response and provide education to patient for prescribed medication. RN will report any adverse and/or side effects to prescribing provider.  Therapeutic Interventions: 1 on 1 counseling sessions, Psychoeducation, Medication administration, Evaluate responses to treatment, Monitor vital signs and CBGs as ordered, Perform/monitor CIWA, COWS, AIMS and Fall Risk screenings as ordered, Perform wound care treatments as ordered.  Evaluation of Outcomes: Not Met   LCSW Treatment Plan for Primary Diagnosis: <principal problem not specified> Long Term Goal(s): Safe transition to appropriate next level of care at discharge, Engage patient in therapeutic group addressing interpersonal concerns.  Short Term Goals: Engage patient in aftercare planning with referrals and resources and Increase skills for wellness and recovery  Therapeutic Interventions: Assess for all discharge needs, 1 to 1 time with Social worker, Explore available resources and support systems, Assess for adequacy in community support network, Educate family and significant other(s) on suicide prevention, Complete Psychosocial Assessment, Interpersonal group therapy.  Evaluation of Outcomes: Not Met   Progress in Treatment: Attending groups: No. Participating in groups: No. Taking medication as prescribed: Yes. Toleration medication: Yes. Family/Significant other contact made: Yes, individual(s)  contacted:  with pt Patient understands diagnosis: No. Discussing patient identified problems/goals with staff: Yes. Medical problems stabilized or resolved: Yes. Denies suicidal/homicidal ideation: Yes. Issues/concerns per patient self-inventory: No. Other:   New problem(s) identified: No, Describe:  none  New Short Term/Long Term Goal(s): Medication stabilization, elimination of SI thoughts, and development of a comprehensive mental wellness plan.   Patient Goals:  "Find a place to stay"  Discharge Plan or Barriers: CSW will continue to follow up for appropriate referrals and possible discharge planning  Reason for Continuation of Hospitalization: Anxiety Delusions  Hallucinations Medication stabilization  Estimated Length of Stay: 3-5 days   Attendees: Patient: [name]  12/28/2019   Physician: Dr. Jake Samples, MD 12/28/2019   Nursing:  12/28/2019   RN Care Manager: 12/28/2019   Social Worker: Stephanie Acre, [place] 12/28/2019   Recreational Therapist:  12/28/2019   Other: Ovidio Kin, MSW intern  12/28/2019   Other:  12/28/2019   Other: 12/28/2019    Scribe for Treatment Team: Billey Chang, Student-Social Work 12/28/2019 9:11 AM

## 2019-12-28 NOTE — [facility] Group Notes (Signed)
LCSW Group Therapy Note  12/28/2019 1:45 PM  Type of Therapy/Topic: Group Therapy: Feelings about Diagnosis  Participation Level: Did Not Attend   Description of Group:  This group will allow patients to explore their thoughts and feelings about diagnoses they have received. Patients will be guided to explore their level of understanding and acceptance of these diagnoses. Facilitator will encourage patients to process their thoughts and feelings about the reactions of others to their diagnosis and will guide patients in identifying ways to discuss their diagnosis with significant others in their lives. This group will be process-oriented, with patients participating in exploration of their own experiences, giving and receiving support, and processing challenge from other group members.  Therapeutic Goals: 1. Patient will demonstrate understanding of diagnosis as evidenced by identifying two or more symptoms of the disorder 2. Patient will be able to express two feelings regarding the diagnosis 3. Patient will demonstrate their ability to communicate their needs through discussion and/or role play  Summary of Patient Progress: Patient sleeping, did not attend.   Therapeutic Modalities:  Cognitive Behavioral Therapy Brief Therapy Feelings Identification   Stephanie Acre, MSW, [place] Social Worker

## 2019-12-28 NOTE — Progress Notes (Signed)
[facility] MD Progress Note  12/28/2019 12:14 PM [name]  MRN:  [id number] Subjective:    Continues to expressed her fixed delusions of harassment and stalking, naming and individual, hoping the [employer] will reimburse her for furniture she abandoned by leaving her apartment due to harassment so forth she will he is not able to give reality based, he only point out to her that after all this time she has not been harmed she still believes she will be harmed.  Again she is compliant with meds at first she stated she did not want was called and she stated she would take it.  Remains hypertensive. Principal Problem: Chronic schizophrenia Diagnosis: Active Problems:   Paranoid schizophrenia ([place])  Total Time spent with patient: 20 minutes  Past Psychiatric History: See eval  Past Medical History:  Past Medical History:  Diagnosis Date  . Anxiety   . Delusional disorder ([place])   . Diabetes mellitus without complication ([place])   . Homelessness   . Hypertension   . Major depressive disorder, recurrent, severe with psychotic features ([place])   . Overdose   . Paranoid schizophrenia ([place])   . Schizophrenia ([place])   . Tremors of nervous system    History reviewed. No pertinent surgical history. Family History:  Family History  Problem Relation Age of Onset  . Hypertension Mother   . Diabetes Mother   . Aneurysm Mother   . Hypertension Father   . Diabetes Father   . Hypertension Sister   . Diabetes Sister   . Hypertension Other   . Diabetes Other    Family Psychiatric  History: See eval Social History:  Social History   Substance and Sexual Activity  Alcohol Use No     Social History   Substance and Sexual Activity  Drug Use No    Social History   Socioeconomic History  . Marital status: Single    Spouse name: Not on file  . Number of children: Not on file  . Years of education: Not on file  . Highest education level: Not on file  Occupational History  . Not on  file  Tobacco Use  . Smoking status: Never Smoker  . Smokeless tobacco: Never Used  Substance and Sexual Activity  . Alcohol use: No  . Drug use: No  . Sexual activity: Never  Other Topics Concern  . Not on file  Social History Narrative   Lives alone in an apartment. Has steps up to apartment      Right handed      Highest level of edu- Some college      Unemployed   Social Determinants of Health   Financial Resource Strain:   . Difficulty of Paying Living Expenses: Not on file  Food Insecurity:   . Worried About Charity fundraiser in the Last Year: Not on file  . Ran Out of Food in the Last Year: Not on file  Transportation Needs:   . Lack of Transportation (Medical): Not on file  . Lack of Transportation (Non-Medical): Not on file  Physical Activity:   . Days of Exercise per Week: Not on file  . Minutes of Exercise per Session: Not on file  Stress:   . Feeling of Stress : Not on file  Social Connections:   . Frequency of Communication with Friends and Family: Not on file  . Frequency of Social Gatherings with Friends and Family: Not on file  . Attends Religious Services: Not on file  .  Active Member of Clubs or Organizations: Not on file  . Attends Archivist Meetings: Not on file  . Marital Status: Not on file   Additional Social History:                         Sleep: Fair  Appetite:  Fair  Current Medications: Current Facility-Administered Medications  Medication Dose Route Frequency Provider Last Rate Last Admin  . benztropine (COGENTIN) tablet 0.5 mg  0.5 mg Oral BID Johnn Hai, MD   0.5 mg at 12/28/19 0801  . gabapentin (NEURONTIN) capsule 300 mg  300 mg Oral TID Mordecai Maes, NP   300 mg at 12/28/19 0801  . ibuprofen (ADVIL) tablet 600 mg  600 mg Oral Q6H PRN Deloria Lair, NP   600 mg at 12/27/19 2108  . risperiDONE (RISPERDAL) tablet 3 mg  3 mg Oral BID Johnn Hai, MD   3 mg at 12/28/19 0801  . temazepam (RESTORIL)  capsule 30 mg  30 mg Oral QHS Johnn Hai, MD   30 mg at 12/27/19 2108    Lab Results:  Results for orders placed or performed during the hospital encounter of 12/26/19 (from the past 48 hour(s))  Hemoglobin A1c     Status: Abnormal   Collection Time: 12/27/19  6:14 AM  Result Value Ref Range   Hgb A1c MFr Bld 6.3 (H) 4.8 - 5.6 %    Comment: (NOTE) Pre diabetes:          5.7%-6.4% Diabetes:              >6.4% Glycemic control for   <7.0% adults with diabetes    Mean Plasma Glucose 134.11 mg/dL    Comment: Performed at [facility], [place] [address], [place],  [address]  Lipid panel     Status: Abnormal   Collection Time: 12/27/19  6:14 AM  Result Value Ref Range   Cholesterol 226 (H) 0 - 200 mg/dL   Triglycerides 88 <150 mg/dL   HDL 49 >40 mg/dL   Total CHOL/HDL Ratio 4.6 RATIO   VLDL 18 0 - 40 mg/dL   LDL Cholesterol 159 (H) 0 - 99 mg/dL    Comment:        Total Cholesterol/HDL:CHD Risk Coronary Heart Disease Risk Table                     Men   Women  1/2 Average Risk   3.4   3.3  Average Risk       5.0   4.4  2 X Average Risk   9.6   7.1  3 X Average Risk  23.4   11.0        Use the calculated Patient Ratio above and the CHD Risk Table to determine the patient's CHD Risk.        ATP III CLASSIFICATION (LDL):  <100     mg/dL   Optimal  100-129  mg/dL   Near or Above                    Optimal  130-159  mg/dL   Borderline  160-189  mg/dL   High  >190     mg/dL   Very High Performed at [facility] [address], [place],  [address]   Prolactin     Status: Abnormal   Collection Time: 12/27/19  6:14 AM  Result Value  Ref Range   Prolactin 60.4 (H) 4.8 - 23.3 ng/mL    Comment: (NOTE) Performed At: [facility] [place], [place] [id number] Rush Farmer MD [id number]   TSH     Status: None   Collection Time: 12/27/19  6:14 AM  Result Value Ref Range   TSH 2.712 0.350 - 4.500 uIU/mL    Comment:  Performed by a 3rd Generation assay with a functional sensitivity of <=0.01 uIU/mL. Performed at [facility], [place] [address], [place], [place] [address]     Blood Alcohol level:  Lab Results  Component Value Date   ETH <10 12/25/2019   ETH <10 [id number]    Metabolic Disorder Labs: Lab Results  Component Value Date   HGBA1C 6.3 (H) 12/27/2019   MPG 134.11 12/27/2019   MPG 142.72 06/05/2019   Lab Results  Component Value Date   PROLACTIN 60.4 (H) 12/27/2019   Lab Results  Component Value Date   CHOL 226 (H) 12/27/2019   TRIG 88 12/27/2019   HDL 49 12/27/2019   CHOLHDL 4.6 12/27/2019   VLDL 18 12/27/2019   LDLCALC 159 (H) 12/27/2019   LDLCALC 139 (H) 06/05/2019    Physical Findings: AIMS: Facial and Oral Movements Muscles of Facial Expression: None, normal Lips and Perioral Area: None, normal Jaw: None, normal Tongue: None, normal,Extremity Movements Upper (arms, wrists, hands, fingers): None, normal Lower (legs, knees, ankles, toes): None, normal, Trunk Movements Neck, shoulders, hips: None, normal, Overall Severity Severity of abnormal movements (highest score from questions above): None, normal Incapacitation due to abnormal movements: None, normal Patient's awareness of abnormal movements (rate only patient's report): No Awareness, Dental Status Current problems with teeth and/or dentures?: No Does patient usually wear dentures?: No  CIWA:    COWS:     Musculoskeletal: Strength & Muscle Tone: within normal limits Gait & Station: normal  Psychiatric Specialty Exam: Physical Exam  Review of Systems  Blood pressure (!) 135/109, pulse 93, temperature 98 F (36.7 C), temperature source Oral, resp. rate 20, height 5\' 5"  (1.651 m), weight 121.6 kg, last menstrual period 10/29/2011, SpO2 100 %.Body mass index is 44.6 kg/m.  General Appearance: Casual  Eye Contact:  Good  Speech:  Clear and Coherent  Volume:  Normal  Mood:  Anxious   Affect:  Appropriate  Thought Process:  Coherent and Goal Directed  Orientation:  Full (Time, Place, and Person)  Thought Content:  Illogical, Delusions and Paranoid Ideation  Suicidal Thoughts:  No  Homicidal Thoughts:  No  Memory:  Immediate;   Fair Recent;   Fair Remote;   Fair  Judgement:  Impaired  Insight:  Shallow  Psychomotor Activity:  Decreased  Concentration:  Concentration: Fair  Recall:  [employer] of Knowledge:  Fair  Language:  Fair  Akathisia:  Negative  Handed:  Right  AIMS (if indicated):     Assets:  Communication Skills Desire for Improvement  ADL's:  Intact  Cognition:  WNL  Sleep:  Number of Hours: 7.5     Treatment Plan Summary: Daily contact with patient to assess and evaluate symptoms and progress in treatment and Medication management  Continue cognitive therapy reality based therapy continue to monitor for safety no change in precautions  Axcel Horsch, MD 12/28/2019, 12:14 PM

## 2019-12-28 NOTE — Progress Notes (Signed)
   12/28/19 2100  Psych Admission Type (Psych Patients Only)  Admission Status Involuntary  Psychosocial Assessment  Patient Complaints Anxiety  Eye Contact Fair  Facial Expression Animated;Anxious;Pensive  Affect Anxious;Appropriate to circumstance;Preoccupied  Speech Logical/coherent;Pressured  Interaction Assertive  Motor Activity Fidgety  Appearance/Hygiene Unremarkable  Behavior Characteristics Cooperative  Mood Anxious  Thought Process  Coherency Tangential  Content Preoccupation;Paranoia  Delusions Paranoid  Perception Derealization  Hallucination None reported or observed  Judgment Poor  Confusion Mild  Danger to Self  Current suicidal ideation? Denies  Danger to Others  Danger to Others None reported or observed

## 2019-12-28 NOTE — Progress Notes (Signed)
Pt denies HI/SI/AVH.  Pt stated that she is anxious and continues to have delusions.  Pt is pleasant and has been calm.  RN administered medications per MD orders. RN Actively listened to pt describe feelings and provided redirection as needed. RN will continue to monitor and assist pt as needed.

## 2019-12-28 NOTE — Progress Notes (Signed)
Recreation Therapy Notes  Date: 3.3.21 Time: 1000 Location: 500 Hall Dayroom  Group Topic:  Goal Setting  Goal Area(s) Addresses:  Patient will be able to set a SMART daily goal.   Patient will be able to identify benefit of setting goals post d/c.  Intervention:  Worksheet  Activity: Setting Life Goals.  Patients were given worksheets broken up into six categories (family, friends, work/school, spirituality, body and mental health).  Patients were to identify what they are doing well in each category, what needs improvement and then set a goal for making the improvement.  Education:  Discharge Planning, Coping Skills, Goal Planning  Education Outcome: Acknowledges Education/In Group Clarification Provided/Needs Additional Education  Clinical Observations:  Pt did not attend group session.    [name] , LRT/CTRS         [name] A 12/28/2019 11:47 AM

## 2019-12-29 MED ORDER — RISPERIDONE 3 MG PO TABS: 6.0000 mg | ORAL_TABLET | Freq: Every day | ORAL | 1 refills | Status: DC

## 2019-12-29 MED ORDER — PALIPERIDONE PALMITATE ER 234 MG/1.5ML IM SUSY
234.0000 mg | PREFILLED_SYRINGE | Freq: Once | INTRAMUSCULAR | Status: AC
  Administered 2019-12-29: 11:00:00 234 mg via INTRAMUSCULAR
  Filled 2019-12-29: qty 1.5

## 2019-12-29 MED ORDER — INVEGA SUSTENNA 117 MG/0.75ML IM SUSY: 117.0000 mg | PREFILLED_SYRINGE | INTRAMUSCULAR | 0 refills | Status: AC

## 2019-12-29 MED ORDER — PALIPERIDONE PALMITATE ER 156 MG/ML IM SUSY: 156.0000 mg | PREFILLED_SYRINGE | Freq: Once | INTRAMUSCULAR | 0 refills | Status: DC

## 2019-12-29 MED ORDER — PALIPERIDONE PALMITATE ER 117 MG/0.75ML IM SUSY: 117.0000 mg | PREFILLED_SYRINGE | INTRAMUSCULAR | Status: DC

## 2019-12-29 MED ORDER — TEMAZEPAM 30 MG PO CAPS: 30.0000 mg | ORAL_CAPSULE | Freq: Every day | ORAL | 1 refills | Status: DC

## 2019-12-29 MED ORDER — BENZTROPINE MESYLATE 0.5 MG PO TABS: 0.5000 mg | ORAL_TABLET | Freq: Two times a day (BID) | ORAL | 2 refills | Status: DC

## 2019-12-29 MED ORDER — PALIPERIDONE PALMITATE ER 156 MG/ML IM SUSY: 156.0000 mg | PREFILLED_SYRINGE | Freq: Once | INTRAMUSCULAR | Status: DC

## 2019-12-29 NOTE — Progress Notes (Signed)
  [facility] Adult Case Management Discharge Plan :  Will you be returning to the same living situation after discharge:  No. Provided shelter resources. At discharge, do you have transportation home?: Yes,  Safe transportation to her car at [facility]. Do you have the ability to pay for your medications: Yes,  Medicare.  Release of information consent forms completed and in the chart.  Patient to Follow up at: Follow-up [place] to.   Specialty: Professional Counselor Why: You will need to go to this provider's office and finish your intake assessment paperwork.  You may go Monday through Friday, anywhere between 8:30 am to 12:00 pm, or from 1:00 to 2:30 pm. Contact information: [facility] [place] [address] [phone]        [facility], [place]. Go to.   Why: Please use walk in hours to obtain medication management and therapy services. Monday through Friday, 8:00am to 4:00pm. Bring your hospital discharge paperwork with you! Contact information: [address] [place] [address] [phone]           Next level of care provider has access to [place] and Suicide Prevention discussed: Yes,  with patient. Patient declined consents.  Has patient been referred to the Quitline?: Patient refused referral  Patient has been referred for addiction treatment: Yes  Joellen Jersey, [place] 12/29/2019, 9:31 AM

## 2019-12-29 NOTE — Discharge Summary (Addendum)
Physician Discharge Summary Note  Patient:  [name] is an 54 y.o., female  MRN:  [id number]  DOB:  November 20, 1965  Patient phone:  [phone] (home)   Patient address:   [address] [place] [address],   Total Time spent with patient: Greater than 30 minutes  Date of Admission:  12/26/2019  Date of Discharge: 12/29/19  Reason for Admission: Worsening paranoia.  Principal Problem: Paranoid schizophrenia [facility])  Discharge Diagnoses: Principal Problem:   Paranoid schizophrenia [facility])  Past Psychiatric History: Several psychiatric admissions/discharges here at [facility] with similar presentations.  Past Medical History:  Past Medical History:  Diagnosis Date  . Anxiety   . Delusional disorder ([place])   . Diabetes mellitus without complication ([place])   . Homelessness   . Hypertension   . Major depressive disorder, recurrent, severe with psychotic features ([place])   . Overdose   . Paranoid schizophrenia ([place])   . Schizophrenia ([place])   . Tremors of nervous system    History reviewed. No pertinent surgical history.  Family History:  Family History  Problem Relation Age of Onset  . Hypertension Mother   . Diabetes Mother   . Aneurysm Mother   . Hypertension Father   . Diabetes Father   . Hypertension Sister   . Diabetes Sister   . Hypertension Other   . Diabetes Other    Family Psychiatric  History: See H&P  Social History:  Social History   Substance and Sexual Activity  Alcohol Use No     Social History   Substance and Sexual Activity  Drug Use No    Social History   Socioeconomic History  . Marital status: Single    Spouse name: Not on file  . Number of children: Not on file  . Years of education: Not on file  . Highest education level: Not on file  Occupational History  . Not on file  Tobacco Use  . Smoking status: Never Smoker  . Smokeless tobacco: Never Used  Substance and Sexual Activity  . Alcohol use: No  . Drug use: No  . Sexual  activity: Never  Other Topics Concern  . Not on file  Social History Narrative   Lives alone in an apartment. Has steps up to apartment      Right handed      Highest level of edu- Some college      Unemployed   Social Determinants of Health   Financial Resource Strain:   . Difficulty of Paying Living Expenses: Not on file  Food Insecurity:   . Worried About Charity fundraiser in the Last Year: Not on file  . Ran Out of Food in the Last Year: Not on file  Transportation Needs:   . Lack of Transportation (Medical): Not on file  . Lack of Transportation (Non-Medical): Not on file  Physical Activity:   . Days of Exercise per Week: Not on file  . Minutes of Exercise per Session: Not on file  Stress:   . Feeling of Stress : Not on file  Social Connections:   . Frequency of Communication with Friends and Family: Not on file  . Frequency of Social Gatherings with Friends and Family: Not on file  . Attends Religious Services: Not on file  . Active Member of Clubs or Organizations: Not on file  . Attends Archivist Meetings: Not on file  . Marital Status: Not on file   Hospital Course: (Per Md's admission evaluation): This  is the latest of numerous admissions and healthcare system encounters for [name] in the [facility] system. She once again requires admission due to intense paranoia, untreated/chronically undertreated schizophrenia, complicated by noncompliance as an outpatient and homelessness. On interview the patient continues to ramble about individual stalking her and again this is a pretty consistent and treatment resistant delusion for her.  She names specific individuals so forth is unclear whether they do exist or not.  She denies hallucinations or thoughts of self-harm. Drug screen pending usually is negative.  This is one of several psychiatric discharge summaries for this 54 year old AA female with hx of chronic mental illness & multiple psychiatric admissions.  She is known in this [facility] & other psychiatric hospitals within the surrounding areas for worsening symptoms of paranoia, delusions, homelessness & non-compliant to her treatment regimen. She has been tried on multiple psychotropic medications for her symptoms & it appears nothing has actually been helpful in stabilizing her symptoms because she does not always comply to taking them. However, her paranoid ideations, delusional thoughts & issues with homeless could be blamed as a potential barrier to mental health stabilization. This patient also may be lacking support systems. She was brought to the [facility] this time around for evaluation & treatment worsening paranoia.  After evaluation of her presenting symptoms this time around, [name] was recommended for mood stabilization treatments. The medication regimen for her presenting symptoms were discussed & with her consent initiated. She received, stabilized & was discharged on the medications as listed below on her discharge medication lists. She was also enrolled & participated sometimes in the group counseling sessions being offered & held on this unit. She learned coping skills. She presented on this admission, no other significant chronic medical conditions other than nausea that required treatment & monitoring. She was resumed/discharged on Reglan. She tolerated her treatment regimen without any adverse effects or reactions reported.  And because of the chronic nature of her psychiatric symptoms, their resistance to the treatment regimen & non-compliance to treatment regimen, [name] was treated, stabilized & being discharged on two separate antipsychotic medications (Risperdal tablets & Invega injectable). This is because she has not been able to achieve symptoms control under an antipsychotic monotherapy. These two combination antipsychotic therapies seem effective in stabilizing her symptoms at this time. It will benefit patient to continue on these  combination antipsychotic therapies as recommended. However, as her symptoms continue to improve, she may then be titrated down to an antipsychotic monotherapy. This is to prevent the chances for the development of metabolic syndrome usually associated with use of multiple antipsychotic regimen. This has to be done within the proper monitoring, discretion & judgement of her outpatient psychiatric provider. Mashanda's symptoms responded well to her current treatment regimen.  During the course of her hospitalization, the 15-minute checks were adequate to ensure Monifah's safety.  Patient did not display any dangerous, violent or suicidal behavior on the unit. She interacted with patients & staff appropriately, participated appropriately in some group sessions/therapies. Her medications were addressed & adjusted to meet her needs. She was recommended for outpatient follow-up care & medication management upon discharge to assure her continuity of care.  At the time of discharge patient is not reporting any acute suicidal/homicidal ideations. She feels more confident about her self-care & in taking her medications. She currently denies any new issues or concerns. Education and supportive counseling provided throughout her hospital stay & upon discharge.  Today upon her discharge evaluation with the attending psychiatrist,  Lavae shares she is doing well. She denies any other specific concerns. She is sleeping well. Her appetite is good. She denies other physical complaints. She denies AH/VH. She feels that her medications have been helpful & is in agreement to continue her current treatment regimen as recommended. She was able to engage in safety planning including plan to return to [facility] or contact emergency services if she feels unable to maintain her own safety or the safety of others. Pt had no further questions, comments, or concerns. She left [facility] with all personal belongings in no apparent distress.  Transportation per the [employer] safe [employer].  Physical Findings: AIMS: Facial and Oral Movements Muscles of Facial Expression: None, normal Lips and Perioral Area: None, normal Jaw: None, normal Tongue: None, normal,Extremity Movements Upper (arms, wrists, hands, fingers): None, normal Lower (legs, knees, ankles, toes): None, normal, Trunk Movements Neck, shoulders, hips: None, normal, Overall Severity Severity of abnormal movements (highest score from questions above): None, normal Incapacitation due to abnormal movements: None, normal Patient's awareness of abnormal movements (rate only patient's report): No Awareness, Dental Status Current problems with teeth and/or dentures?: No Does patient usually wear dentures?: No  CIWA:    COWS:     Musculoskeletal: Strength & Muscle Tone: within normal limits Gait & Station: normal Patient leans: N/A  Psychiatric Specialty Exam: Physical Exam  Nursing note and vitals reviewed. Constitutional: She is oriented to person, place, and time. She appears well-developed and well-nourished.  Respiratory: Effort normal.  Genitourinary:    Genitourinary Comments: Deferred   Musculoskeletal:        General: Normal range of motion.     Cervical back: Normal range of motion.  Neurological: She is alert and oriented to person, place, and time.  Skin: Skin is warm and dry.    Review of Systems  Constitutional: Negative.  Negative for chills and fever.  HENT: Negative for congestion and sore throat.   Respiratory: Negative for cough, shortness of breath and wheezing.   Cardiovascular: Negative for chest pain and palpitations.  Gastrointestinal: Negative for nausea and vomiting.  Genitourinary: Negative for dysuria.  Musculoskeletal: Negative for myalgias.  Skin: Negative for itching and rash.  Neurological: Negative for dizziness and headaches.  Endo/Heme/Allergies: Negative for environmental allergies.       NKDA   Psychiatric/Behavioral: Positive for depression (Stabilized with medication prior to discharge) and hallucinations (Hx. delusions & paranoia (Stabilized with medication prior to discharge)). Negative for memory loss, substance abuse and suicidal ideas. The patient is not nervous/anxious and does not have insomnia.     Blood pressure 136/80, pulse 84, temperature 98 F (36.7 C), temperature source Oral, resp. rate 20, height 5\' 5"  (1.651 m), weight 121.6 kg, last menstrual period 10/29/2011, SpO2 100 %.Body mass index is 44.6 kg/m.  See Md's discharge SRA  Sleep:  Number of Hours: 8.25   Has this patient used any form of tobacco in the last 30 days? (Cigarettes, Smokeless Tobacco, Cigars, and/or Pipes): N/A  Blood Alcohol level:  Lab Results  Component Value Date   ETH <10 12/25/2019   ETH <10 [id number]   Metabolic Disorder Labs:  Lab Results  Component Value Date   HGBA1C 6.3 (H) 12/27/2019   MPG 134.11 12/27/2019   MPG 142.72 06/05/2019   Lab Results  Component Value Date   PROLACTIN 60.4 (H) 12/27/2019   Lab Results  Component Value Date   CHOL 226 (H) 12/27/2019   TRIG 88 12/27/2019   HDL 49 12/27/2019  CHOLHDL 4.6 12/27/2019   VLDL 18 12/27/2019   LDLCALC 159 (H) 12/27/2019   LDLCALC 139 (H) 06/05/2019   See Psychiatric Specialty Exam and Suicide Risk Assessment completed by Attending Physician prior to discharge.  Discharge destination:  Home  Is patient on multiple antipsychotic therapies at discharge: Yes,   Do you recommend tapering to monotherapy for antipsychotics?  Yes   Has Patient had three or more failed trials of antipsychotic monotherapy by history:  Yes,   Antipsychotic medications that previously failed include:   1.  Prolixin., 2.  Saphris. and 3.  Olanzapine.  Recommended Plan for Multiple Antipsychotic Therapies: Additional reason(s) for multiple antispychotic treatment:  This is because patient's symptoms has failed to stabilize on an  antipsychotinc monotherapy .  Allergies as of 12/29/2019   No Known Allergies     Medication List    STOP taking these medications   diazepam 5 MG tablet Commonly known as: Valium   LORazepam 1 MG tablet Commonly known as: Ativan     TAKE these medications     Indication  benztropine 0.5 MG tablet Commonly known as: COGENTIN Take 1 tablet (0.5 mg total) by mouth 2 (two) times daily.  Indication: Extrapyramidal Reaction caused by Medications   gabapentin 300 MG capsule Commonly known as: NEURONTIN Take 1 capsule (300 mg total) by mouth 3 (three) times daily.  Indication: Alcohol Withdrawal Syndrome   metoCLOPramide 10 MG tablet Commonly known as: REGLAN Take 1 tablet (10 mg total) by mouth as needed for nausea. Take 1 tablet as needed for nausea and vomitting  Indication: Delayed or Stopped Emptying of Stomach   paliperidone 156 MG/ML Susy injection Commonly known as: INVEGA SUSTENNA Inject 1 mL (156 mg total) into the muscle once for 1 dose. (Due on 01-05-20): For mood control Start taking on: January 05, 2020  Indication: Schizophrenia   Invega Sustenna injection Generic drug: Paliperidone ER Inject 117 mg into the muscle every 28 (twenty-eight) days. (Due on 02-02-20): For mood control Start taking on: February 02, 2020  Indication: Schizophrenia   risperiDONE 3 MG tablet Commonly known as: RISPERDAL Take 2 tablets (6 mg total) by mouth at bedtime.  Indication: Schizophrenia   temazepam 30 MG capsule Commonly known as: RESTORIL Take 1 capsule (30 mg total) by mouth at bedtime.  Indication: [place] to.   Specialty: Professional Counselor Why: You will need to go to this provider's office and finish your intake assessment paperwork.  You may go Monday through Friday, anywhere between 8:30 am to 12:00 pm, or from 1:00 to 2:30 pm. Contact information: [facility]  [place] [address] [phone]        Llc, [place]. Go to.   Why: Please use walk in hours to obtain medication management and therapy services. Monday through Friday, 8:00am to 4:00pm. Bring your hospital discharge paperwork with you! Contact information: [address] [place]  [address] [phone]          Follow-up recommendations: Activity:  As tolerated Diet: As recommended by your primary care doctor. Keep all scheduled follow-up appointments as recommended.  Comments: Prescriptions given at discharge.  Patient agreeable to plan.  Given opportunity to ask questions.  Appears to feel comfortable with discharge denies any current suicidal or homicidal thought. Patient is also instructed prior to discharge to: Take all medications as  prescribed by his/her mental healthcare provider. Report any adverse effects and or reactions from the medicines to his/her outpatient provider promptly. Patient has been instructed & cautioned: To not engage in alcohol and or illegal drug use while on prescription medicines. In the event of worsening symptoms, patient is instructed to call the crisis hotline, 911 and or go to the nearest ED for appropriate evaluation and treatment of symptoms. To follow-up with his/her primary care provider for your other medical issues, concerns and or health care needs.   Signed: Lindell Spar, NP, PMHNP, FNP-BC 12/29/2019, 3:30 PM

## 2019-12-29 NOTE — BHH Suicide Risk Assessment (Signed)
[facility] Discharge Suicide Risk Assessment   Principal Problem: Paranoid schizophrenia ([place]) Discharge Diagnoses: Principal Problem:   Paranoid schizophrenia ([place])   Total Time spent with patient: 45 minutes  Musculoskeletal: Strength & Muscle Tone: within normal limits Gait & Station: normal Patient leans: N/A  Psychiatric Specialty Exam: Review of Systems  Blood pressure (!) 135/109, pulse 93, temperature 98 F (36.7 C), temperature source Oral, resp. rate 20, height 5\' 5"  (1.651 m), weight 121.6 kg, last menstrual period 10/29/2011, SpO2 100 %.Body mass index is 44.6 kg/m.  General Appearance: Casual  Eye Contact::  Fair  Speech:  Clear and [id number]  Volume:  Normal  Mood:  Euthymic  Affect:  Full Range  Thought Process:  Coherent and Goal Directed  Orientation:  Full (Time, Place, and Person)  Thought Content:  Paranoid Ideation and Rumination  Suicidal Thoughts:  No  Homicidal Thoughts:  No  Memory:  Immediate;   Fair Recent;   Fair Remote;   Fair  Judgement:  Fair  Insight:  Shallow  Psychomotor Activity:  Normal  Concentration:  Good  Recall:  Good  Fund of Knowledge:Good  Language: Fair  Akathisia:  Negative  Handed:  Right  AIMS (if indicated):     Assets:  Communication Skills Desire for Improvement  Sleep:  Number of Hours: 8.25  Cognition: WNL  ADL's:  Intact   Mental Status Per Nursing Assessment::   On Admission:  NA  Demographic Factors:  Living alone  Loss Factors: Loss of significant relationship  Historical Factors: NA  Risk Reduction Factors:   Sense of responsibility to family and Religious beliefs about death  Continued Clinical Symptoms:  Previous Psychiatric Diagnoses and Treatments  Cognitive Features That Contribute To Risk:  Closed-mindedness    Suicide Risk:  Minimal: No identifiable suicidal ideation.  Patients presenting with no risk factors but with morbid ruminations; may be classified as minimal risk based on the  severity of the depressive symptoms  Follow-up [place] to.   Specialty: Professional Counselor Why: You will need to go to this provider's office and finish your intake assessment paperwork.  You may go Monday through Friday, anywhere between 8:30 am to 12:00 pm, or from 1:00 to 2:30 pm. Contact information: [facility] [place] [address] [phone]        Llc, [place]. Go to.   Why: Please use walk in hours to obtain medication management and therapy services. Monday through Friday, 8:00am to 4:00pm. Bring your hospital discharge paperwork with you! Contact information: [address] [place] [address] [phone]           Plan Of Care/Follow-up recommendations:  Activity:  Activity/she has been determined by the team that the patient's delusions are certainly fixed and we are not to impact that with another protracted hospital stay she is discharged home with meds listed above  Johnn Hai, MD 12/29/2019, 10:58 AM

## 2019-12-29 NOTE — Progress Notes (Signed)
Pt discharged to lobby. Pt was stable and appreciative at that time. All papers and prescriptions were given and valuables returned. Verbal understanding expressed. Denies SI/HI and A/VH. Pt given opportunity to express concerns and ask questions.  

## 2019-12-29 NOTE — BHH Counselor (Signed)
Patient expresses reluctance to discharge as she does not have an apartment to go to in [facility].  Patient has an apartment in [place], she has chosen to live in her car rather than stay in her home. Patient stated her goal for this hospitalization was to get her housing voucher transferred to [facility]. Prior to admission, on 02/26, patient spoke with Ms.Zigmund Daniel at the [employer] regarding transferring her voucher. Patient spoke to Ms.Dalbert Batman at [place] this afternoon after 3pm, at which time Ms.Dalbert Batman informed patient that she has not received transfer documentation.  Patient declined shelter resources until shortly before discharge.  Patient requested discharge this morning, but is now requesting to remain inpatient until she can move into a new apartment in [facility].  Attending psychiatrist has cleared patient for discharge. CSW will continue to follow to assist in discharge. Patient's car is parked at [employer].  Patient reports she does not want to stay in her car, she also reports she does not plan to return to her apartment in [place].   Stephanie Acre, MSW, [place] Social Worker [facility]  [phone]

## 2019-12-29 NOTE — Progress Notes (Signed)
Recreation Therapy Notes  Date: 3.4.21 Time: 1000 Location: 500 Hall Dayroom  Group Topic: Self-Esteem  Goal Area(s) Addresses:  Patient will successfully identify positive attributes about themselves.  Patient will successfully identify benefit of improved self-esteem.   Intervention: Colored pencils, blank crest  Activity: Crest of Arms.  Patients were given a blank crest divided into four sections.  Patients were to identify four things that make them unique for each section of the crest.  Patients could highlight things such as accomplishments, characteristics, important dates, etc.  Education:  Self-Esteem, Discharge Planning.   Education Outcome: Acknowledges education/In group clarification offered/Needs additional education  Clinical Observations/Feedback: Pt did not attend group session.    [name], LRT/CTRS         [name] A 12/29/2019 11:38 AM

## 2020-01-03 ENCOUNTER — Other Ambulatory Visit: Payer: Self-pay | Admitting: Neurology

## 2020-01-12 ENCOUNTER — Other Ambulatory Visit: Payer: Self-pay

## 2020-01-12 ENCOUNTER — Encounter (HOSPITAL_BASED_OUTPATIENT_CLINIC_OR_DEPARTMENT_OTHER): Payer: Self-pay

## 2020-01-12 ENCOUNTER — Emergency Department (HOSPITAL_BASED_OUTPATIENT_CLINIC_OR_DEPARTMENT_OTHER)
Admission: EM | Admit: 2020-01-12 | Discharge: 2020-01-14 | Disposition: A | Discharge status: Other Acute Inpatient Hospital | Payer: Medicare Other | Attending: Emergency Medicine | Admitting: Emergency Medicine

## 2020-01-12 DIAGNOSIS — Z79899 Other long term (current) drug therapy: Secondary | ICD-10-CM | POA: Diagnosis not present

## 2020-01-12 DIAGNOSIS — R200 Anesthesia of skin: Secondary | ICD-10-CM | POA: Diagnosis present

## 2020-01-12 DIAGNOSIS — Z03818 Encounter for observation for suspected exposure to other biological agents ruled out: Secondary | ICD-10-CM | POA: Diagnosis not present

## 2020-01-12 DIAGNOSIS — Z20822 Contact with and (suspected) exposure to covid-19: Secondary | ICD-10-CM | POA: Insufficient documentation

## 2020-01-12 DIAGNOSIS — F430 Acute stress reaction: Secondary | ICD-10-CM

## 2020-01-12 DIAGNOSIS — I10 Essential (primary) hypertension: Secondary | ICD-10-CM | POA: Insufficient documentation

## 2020-01-12 DIAGNOSIS — Z7984 Long term (current) use of oral hypoglycemic drugs: Secondary | ICD-10-CM | POA: Insufficient documentation

## 2020-01-12 DIAGNOSIS — F439 Reaction to severe stress, unspecified: Secondary | ICD-10-CM | POA: Diagnosis not present

## 2020-01-12 DIAGNOSIS — E119 Type 2 diabetes mellitus without complications: Secondary | ICD-10-CM | POA: Insufficient documentation

## 2020-01-12 DIAGNOSIS — F200 Paranoid schizophrenia: Secondary | ICD-10-CM | POA: Diagnosis not present

## 2020-01-12 LAB — CBC
HCT: 37.2 % (ref 36.0–46.0)
Hemoglobin: 11.4 g/dL — ABNORMAL LOW (ref 12.0–15.0)
MCH: 26 pg (ref 26.0–34.0)
MCHC: 30.6 g/dL (ref 30.0–36.0)
MCV: 84.9 fL (ref 80.0–100.0)
Platelets: 286 10*3/uL (ref 150–400)
RBC: 4.38 MIL/uL (ref 3.87–5.11)
RDW: 14 % (ref 11.5–15.5)
WBC: 5.9 10*3/uL (ref 4.0–10.5)
nRBC: 0 % (ref 0.0–0.2)

## 2020-01-12 LAB — RAPID URINE DRUG SCREEN, HOSP PERFORMED
Amphetamines: NOT DETECTED
Barbiturates: NOT DETECTED
Benzodiazepines: NOT DETECTED
Cocaine: NOT DETECTED
Opiates: NOT DETECTED
Tetrahydrocannabinol: NOT DETECTED

## 2020-01-12 LAB — ETHANOL: Alcohol, Ethyl (B): <10 mg/dL (ref <10)

## 2020-01-12 LAB — COMPREHENSIVE METABOLIC PANEL
ALT: 20 U/L (ref 0–44)
AST: 20 U/L (ref 15–41)
Albumin: 3.9 g/dL (ref 3.5–5.0)
Alkaline Phosphatase: 104 U/L (ref 38–126)
Anion gap: 9 (ref 5–15)
BUN: 17 mg/dL (ref 6–20)
CO2: 25 mmol/L (ref 22–32)
Calcium: 9.1 mg/dL (ref 8.9–10.3)
Chloride: 107 mmol/L (ref 98–111)
Creatinine, Ser: 0.83 mg/dL (ref 0.44–1.00)
GFR calc Af Amer: >60 mL/min (ref >60)
GFR calc non Af Amer: >60 mL/min (ref >60)
Glucose, Bld: 120 mg/dL — ABNORMAL HIGH (ref 70–99)
Potassium: 3.6 mmol/L (ref 3.5–5.1)
Sodium: 141 mmol/L (ref 135–145)
Total Bilirubin: 0.1 mg/dL — ABNORMAL LOW (ref 0.3–1.2)
Total Protein: 7.4 g/dL (ref 6.5–8.1)

## 2020-01-12 LAB — RESPIRATORY PANEL BY RT PCR (FLU A&B, COVID)
Influenza A by PCR: NEGATIVE
Influenza B by PCR: NEGATIVE
SARS Coronavirus 2 by RT PCR: NEGATIVE

## 2020-01-12 LAB — PREGNANCY, URINE: Preg Test, Ur: NEGATIVE

## 2020-01-12 NOTE — ED Notes (Signed)
Tried unsuccessfully with additional blood attempt. Pt. States that [name] is used to gain access normally.

## 2020-01-12 NOTE — ED Notes (Signed)
TTS called to consult and pt refused to talk to that team member due to her race. TTS will call back when another team member is available.

## 2020-01-12 NOTE — BHH Counselor (Addendum)
As the TTS cart was rolled in the pt room (while clinician was on the screen). The pt requested for her assessment to be completed by a "[name]," counselor. Clinician expressed to Claiborne Billings, RN that was fine however the pt will have to wait a little longer due to the counselor is currently in an assessment.   At [place], clinician spoke to Dr. Alvino Chapel to discuss the pt wanting a "[place]-american," counselor.   Vertell Novak, [place], [facility], [facility] Triage Specialist [phone]

## 2020-01-12 NOTE — BH Assessment (Addendum)
Tele Assessment Note   Patient Name: [name] MRN: [id number] Referring Physician: Dr. [name], MD Location of Patient: [facility] Location of Provider: [place] is a 54 y.o. female who came to [facility] due to, according to EDP notes, numbness on her right side that presented after she received some bad news; pt attributes this numbness to the bump on her right forehead. Pt shares w/ clinician she is currently taking Gabapentin 3x/day. Pt shares w/ clinician that she is currently under a lot of stress and that she had recently been in the hospital, though was d/c after 3 days, which she does not think was sufficient. "I feel like I need more therapy--I feel like I nee more time to talk to somebody," she states. Pt shares she has been informed by the [place] police that she is not to contact them unless she is actively being attacked. "The [[place]] police won't help me anymore. I have nowhere to go. No one to turn to. I'm so scared. I'm so nervous," she states. "People are stalking me, bothering me. 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 I'm being cyber stalked." Pt continued to explain that she had talked to the Investment banker, operational on Friday and that when she turned in her application to a Merchant navy officer, she states she believes this woman treated her with bias due to pt having a dark Black complexion and the woman having a lighter Black complexion.  Pt denied current SI, HI, AVH, and NSSIB. She denies access to guns/weapons, engagement with the legal system, and any use of substances.  Pt's protective factors are her ability to recognize she needs mental health assistance and no hx of HI.   Pt is oriented x4. Her recent and remote memory is intact. Pt was cooperative throughout the assessment. Pt's insight, judgement, and impulse control is poor at this time.   Diagnosis: F20.9, Schizophrenia   Past Medical History:  Past Medical History:   Diagnosis Date  . Anxiety   . Delusional disorder ([place])   . Diabetes mellitus without complication ([place])   . Homelessness   . Hypertension   . Major depressive disorder, recurrent, severe with psychotic features ([place])   . Overdose   . Paranoid schizophrenia ([place])   . Schizophrenia ([place])   . Tremors of nervous system     History reviewed. No pertinent surgical history.  Family History:  Family History  Problem Relation Age of Onset  . Hypertension Mother   . Diabetes Mother   . Aneurysm Mother   . Hypertension Father   . Diabetes Father   . Hypertension Sister   . Diabetes Sister   . Hypertension Other   . Diabetes Other     Social History:  reports that she has never smoked. She has never used smokeless tobacco. She reports that she does not drink alcohol or use drugs.  Additional Social History:  Alcohol / Drug Use Pain Medications: Please see MAR Prescriptions: Please see MAR Over the Counter: Please see MAR History of alcohol / drug use?: No history of alcohol / drug abuse Longest period of sobriety (when/how long): Please see MAR  CIWA: CIWA-Ar BP: 122/77 Pulse Rate: 80 COWS:    Allergies: No Known Allergies  Home Medications: (Not in a hospital admission)   OB/GYN Status:  Patient's last menstrual period was 10/29/2011.  General Assessment Data Assessment unable to be completed: Yes Reason for not completing assessment: As the  TTS cart was rolled in the pt room (while clinician was on the screen). The pt requested for her assessment to be completed by a "[place]-american," counselor. Clinician expressed to Claiborne Billings, RN that was fine however the pt will have to wait a little longer due to the counselor is currently in an assessment.  Location of Assessment: [facility] TTS Assessment: In system Is this a Tele or Face-to-Face Assessment?: Tele Assessment Is this an Initial Assessment or a Re-assessment for this encounter?: Initial Assessment Patient  Accompanied by:: N/A Language Other than English: No Living Arrangements: Homeless/Shelter What gender do you identify as?: Female Marital status: Single Pregnancy Status: No Living Arrangements: Alone Can pt return to current living arrangement?: Yes Admission Status: Voluntary Is patient capable of signing voluntary admission?: Yes Referral Source: Self/Family/Friend Insurance type: Medicare     Crisis Care Plan Living Arrangements: Alone Legal Guardian: Other:(Self) Name of Psychiatrist: [place] Name of Therapist: [facility]  Education Status Is patient currently in school?: No Is the patient employed, unemployed or receiving disability?: Unemployed  Risk to self with the past 6 months Suicidal Ideation: No Has patient been a risk to self within the past 6 months prior to admission? : No Suicidal Intent: No Has patient had any suicidal intent within the past 6 months prior to admission? : No Is patient at risk for suicide?: No Suicidal Plan?: No Has patient had any suicidal plan within the past 6 months prior to admission? : No Access to Means: No What has been your use of drugs/alcohol within the last 12 months?: Pt denies SA Previous Attempts/Gestures: No How many times?: 0 Other Self Harm Risks: Pt is currently paranoid, experiencing delusions Triggers for Past Attempts: None known Intentional Self Injurious Behavior: None Family Suicide History: Unable to assess Recent stressful life event(s): Turmoil (Comment)(Pt is homeless, experiencing delusions) Persecutory voices/beliefs?: No Depression: No Depression Symptoms: Fatigue, Isolating Substance abuse history and/or treatment for substance abuse?: No Suicide prevention information given to non-admitted patients: Not applicable  Risk to Others within the past 6 months Homicidal Ideation: No Does patient have any lifetime risk of violence toward others beyond the six months prior to admission? : No Thoughts of Harm to  Others: No Current Homicidal Intent: No Current Homicidal Plan: No Access to Homicidal Means: No Identified Victim: None noted History of harm to others?: No Assessment of Violence: None Noted Violent Behavior Description: None noted Does patient have access to weapons?: No(Pt denies access to guns/weapons) Criminal Charges Pending?: No Does patient have a court date: No Is patient on probation?: No  Psychosis Hallucinations: None noted Delusions: Grandiose  Mental Status Report Appearance/Hygiene: In scrubs Eye Contact: Good Motor Activity: Unremarkable, Other (Comment)(Pt was lying in her hospital bed) Speech: Logical/coherent Level of Consciousness: Alert Mood: Anxious, Fearful Affect: Anxious, Apprehensive Anxiety Level: Moderate Thought Processes: Coherent, Flight of Ideas Judgement: Impaired Orientation: Person, Place, Situation Obsessive Compulsive Thoughts/Behaviors: Moderate  Cognitive Functioning Concentration: Normal Memory: Recent Intact, Remote Intact Is patient IDD: No Insight: Poor Impulse Control: Poor Appetite: Good Have you had any weight changes? : No Change Sleep: Decreased Total Hours of Sleep: (UTA, as pt has been sleeping in her car) Vegetative Symptoms: None  ADLScreening [facility] Assessment Services) Patient's cognitive ability adequate to safely complete daily activities?: Yes Patient able to express need for assistance with ADLs?: Yes Independently performs ADLs?: Yes (appropriate for developmental age)  Prior Inpatient Therapy Prior Inpatient Therapy: Yes Prior Therapy Dates: Multiple Prior Therapy Facilty/Provider(s): [name]  [place], [place] Reason for Treatment: Delusions  Prior Outpatient Therapy Prior Outpatient Therapy: No Does patient have an ACCT team?: No Does patient have Intensive In-House Services?  : No Does patient have [facility] services? : No Does patient have P4CC services?: No  ADL Screening (condition at  time of admission) Patient's cognitive ability adequate to safely complete daily activities?: Yes Is the patient deaf or have difficulty hearing?: No Does the patient have difficulty seeing, even when wearing glasses/contacts?: No Does the patient have difficulty concentrating, remembering, or making decisions?: Yes Patient able to express need for assistance with ADLs?: Yes Does the patient have difficulty dressing or bathing?: No Independently performs ADLs?: Yes (appropriate for developmental age) Does the patient have difficulty walking or climbing stairs?: No Weakness of Legs: Right Weakness of Arms/Hands: Right  Home Assistive Devices/Equipment Home Assistive Devices/Equipment: None  Therapy Consults (therapy consults require a physician order) PT Evaluation Needed: (UTA) OT Evalulation Needed: (UTA) SLP Evaluation Needed: No Abuse/Neglect Assessment (Assessment to be complete while patient is alone) Abuse/Neglect Assessment Can Be Completed: Unable to assess, patient is non-responsive or altered mental status Values / Beliefs Cultural Requests During Hospitalization: (UTA) Spiritual Requests During Hospitalization: (UTA) [place] Needed: (UTA) Transition of Care Team Consult Needed: (UTA) Advance Directives (For Healthcare) Does Patient Have a Medical Advance Directive?: Unable to assess, patient is non-responsive or altered mental status Would patient like information on creating a medical advance directive?: No - Patient declined          Disposition: Adaku Anike, NP, reviewed pt's chart and information and determined pt should be observed overnight for safety and stability and re-assessed by psychiatry in the morning. This information was provided to pt's charge nurse, Lambert Keto RN, at [phone].   Disposition Initial Assessment Completed for this Encounter: Yes Patient referred to: Other (Comment)(Pt will be observed overnight for safety and  stability)  This service was provided via telemedicine using a 2-way, interactive audio and video technology.  Names of all persons participating in this telemedicine service and their role in this encounter. Name: Katianne Fencl Role: Patient  Name: Talbot Grumbling Role: Nurse Practitioner  Name: Windell Hummingbird Role: Clinician    Dannielle Burn 01/12/2020 11:05 PM
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
## 2020-01-12 NOTE — ED Triage Notes (Addendum)
Pt states she received some "bad news" and suddenly developed numbness on the R side. Pt states symptoms started "5-10 minutes ago" (16:00). Pt also asking to speak with TTS. No arm drift noted, but pt showed little effort on the R side when asked to push and pull. EDP notified to evaluate pt.

## 2020-01-13 NOTE — Progress Notes (Signed)
Patient meets inpatient criteria per Letitia Libra, NP. Patient has been faxed out to the following facilities for review:   [facility]  [facility]  CSW will continue to follow and assist with disposition planning.   Domenic Schwab, MSW, LCSW-A Clinical Disposition Social Worker [facility]/TTS [phone]

## 2020-01-13 NOTE — BH Assessment (Signed)
[place] Assessment Progress Note This writer called tele-cart to reassess patient unsuccessfully at 1345 and again at 1410. This Probation officer had been informed that cart was in patient's room. Unsure of status at this time. TTS will attempt to contact staff to ensure monitor is in patient's room.

## 2020-01-13 NOTE — ED Notes (Signed)
Phone call made to TTS regarding update on plan of care status; spoke with Marijean Bravo who transferred me to Maudie Mercury the [facility] at [facility]. Advised both that patient has been in department for over 28 hours.  Maudie Mercury states she will look into patient's status and call back. Gave her the phone number to ED nursing station.
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
## 2020-01-13 NOTE — ED Notes (Signed)
Phone call made to [facility] and they are currently having their bed meeting.  They cannot say when they will get to the re-eval of this patient but hopefully it will be this morning. Dr. [name] notified.

## 2020-01-13 NOTE — BH Assessment (Signed)
[place] Assessment Progress Note This writer attempted to contact telecart at 1611 attempting to reassess patient unsuccessful.

## 2020-01-13 NOTE — ED Notes (Signed)
Spoke to [facility] Assessments regarding the follow up TTS.  Related that the patient has been here 20 hours.  Was informed that the provider is supposed to see the patient on TTS but is unsure what the status but will check and return call.

## 2020-01-13 NOTE — ED Notes (Signed)
Spoke with Larose Kells at [facility]; patient accepted to the adult inpatient unit at [facility]; bed 508 bed 1; call report to [phone].

## 2020-01-13 NOTE — ED Notes (Signed)
Called [facility] for transportation to [facility]; left message on voicemail to have call returned.

## 2020-01-14 ENCOUNTER — Other Ambulatory Visit: Payer: Self-pay

## 2020-01-14 ENCOUNTER — Encounter (HOSPITAL_COMMUNITY): Payer: Self-pay | Admitting: Psychiatry

## 2020-01-14 ENCOUNTER — Inpatient Hospital Stay (HOSPITAL_COMMUNITY)
Admission: AD | Admit: 2020-01-14 | Discharge: 2020-01-18 | DRG: 885 | Disposition: A | Discharge status: Home / Self Care | Payer: Medicare Other | Source: Intra-hospital | Attending: Psychiatry | Admitting: Psychiatry

## 2020-01-14 DIAGNOSIS — Z9119 Patient's noncompliance with other medical treatment and regimen: Secondary | ICD-10-CM | POA: Diagnosis not present

## 2020-01-14 DIAGNOSIS — F209 Schizophrenia, unspecified: Secondary | ICD-10-CM

## 2020-01-14 DIAGNOSIS — I10 Essential (primary) hypertension: Secondary | ICD-10-CM | POA: Diagnosis present

## 2020-01-14 DIAGNOSIS — Z20822 Contact with and (suspected) exposure to covid-19: Secondary | ICD-10-CM | POA: Diagnosis present

## 2020-01-14 DIAGNOSIS — E119 Type 2 diabetes mellitus without complications: Secondary | ICD-10-CM | POA: Diagnosis present

## 2020-01-14 DIAGNOSIS — F419 Anxiety disorder, unspecified: Secondary | ICD-10-CM | POA: Diagnosis present

## 2020-01-14 DIAGNOSIS — F200 Paranoid schizophrenia: Secondary | ICD-10-CM | POA: Diagnosis not present

## 2020-01-14 DIAGNOSIS — Z590 Homelessness: Secondary | ICD-10-CM | POA: Diagnosis not present

## 2020-01-14 DIAGNOSIS — G2581 Restless legs syndrome: Secondary | ICD-10-CM | POA: Diagnosis present

## 2020-01-14 DIAGNOSIS — G4700 Insomnia, unspecified: Secondary | ICD-10-CM | POA: Diagnosis present

## 2020-01-14 DIAGNOSIS — F22 Delusional disorders: Secondary | ICD-10-CM | POA: Diagnosis present

## 2020-01-14 LAB — GLUCOSE, CAPILLARY: Glucose-Capillary: 125 mg/dL — ABNORMAL HIGH (ref 70–99)

## 2020-01-14 MED ORDER — RISPERIDONE 2 MG PO TABS
2.0000 mg | ORAL_TABLET | Freq: Two times a day (BID) | ORAL | Status: DC
  Administered 2020-01-14 – 2020-01-15 (×2): 2 mg via ORAL
  Filled 2020-01-14 (×5): qty 1

## 2020-01-14 MED ORDER — ACETAMINOPHEN 325 MG PO TABS: 650.0000 mg | ORAL_TABLET | Freq: Four times a day (QID) | ORAL | Status: DC | PRN

## 2020-01-14 MED ORDER — RISPERIDONE 3 MG PO TABS
3.0000 mg | ORAL_TABLET | Freq: Once | ORAL | Status: DC
  Filled 2020-01-14: qty 1

## 2020-01-14 MED ORDER — LORAZEPAM 1 MG PO TABS
ORAL_TABLET | ORAL | Status: AC
  Filled 2020-01-14: qty 1

## 2020-01-14 MED ORDER — TEMAZEPAM 15 MG PO CAPS
15.0000 mg | ORAL_CAPSULE | Freq: Every evening | ORAL | Status: DC | PRN
  Administered 2020-01-15: 15 mg via ORAL
  Filled 2020-01-14 (×2): qty 1

## 2020-01-14 MED ORDER — HYDROXYZINE HCL 25 MG PO TABS
25.0000 mg | ORAL_TABLET | Freq: Three times a day (TID) | ORAL | Status: DC | PRN
  Administered 2020-01-15: 25 mg via ORAL
  Filled 2020-01-14 (×2): qty 1

## 2020-01-14 MED ORDER — MAGNESIUM HYDROXIDE 400 MG/5ML PO SUSP: 30.0000 mL | Freq: Every day | ORAL | Status: DC | PRN

## 2020-01-14 MED ORDER — ZIPRASIDONE MESYLATE 20 MG IM SOLR: 20.0000 mg | INTRAMUSCULAR | Status: DC | PRN

## 2020-01-14 MED ORDER — ALUM & MAG HYDROXIDE-SIMETH 200-200-20 MG/5ML PO SUSP: 30.0000 mL | ORAL | Status: DC | PRN

## 2020-01-14 MED ORDER — OLANZAPINE 10 MG PO TBDP
ORAL_TABLET | ORAL | Status: AC
  Filled 2020-01-14: qty 1

## 2020-01-14 MED ORDER — GABAPENTIN 100 MG PO CAPS
100.0000 mg | ORAL_CAPSULE | Freq: Three times a day (TID) | ORAL | Status: DC
  Administered 2020-01-14 – 2020-01-15 (×2): 100 mg via ORAL
  Filled 2020-01-14 (×6): qty 1

## 2020-01-14 MED ORDER — TEMAZEPAM 30 MG PO CAPS: 30.0000 mg | ORAL_CAPSULE | Freq: Every evening | ORAL | Status: DC | PRN

## 2020-01-14 MED ORDER — LORAZEPAM 2 MG/ML IJ SOLN: 1.0000 mg | Freq: Four times a day (QID) | INTRAMUSCULAR | Status: DC | PRN

## 2020-01-14 MED ORDER — OLANZAPINE 10 MG PO TBDP
10.0000 mg | ORAL_TABLET | Freq: Three times a day (TID) | ORAL | Status: DC | PRN
  Administered 2020-01-14: 10 mg via ORAL

## 2020-01-14 MED ORDER — RISPERIDONE 3 MG PO TABS
6.0000 mg | ORAL_TABLET | Freq: Every day | ORAL | Status: DC
  Filled 2020-01-14 (×2): qty 2

## 2020-01-14 MED ORDER — LORAZEPAM 1 MG PO TABS
1.0000 mg | ORAL_TABLET | Freq: Four times a day (QID) | ORAL | Status: DC | PRN
  Administered 2020-01-14: 1 mg via ORAL
  Filled 2020-01-14: qty 1

## 2020-01-14 MED ORDER — GABAPENTIN 300 MG PO CAPS
300.0000 mg | ORAL_CAPSULE | Freq: Three times a day (TID) | ORAL | Status: DC
  Administered 2020-01-14 (×2): 300 mg via ORAL
  Filled 2020-01-14 (×7): qty 1

## 2020-01-14 MED ORDER — BENZTROPINE MESYLATE 0.5 MG PO TABS
0.5000 mg | ORAL_TABLET | Freq: Two times a day (BID) | ORAL | Status: DC
  Administered 2020-01-14 – 2020-01-18 (×9): 0.5 mg via ORAL
  Filled 2020-01-14 (×15): qty 1
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
## 2020-01-14 NOTE — H&P (Addendum)
Psychiatric Admission Assessment Adult  Patient Identification: [name] MRN:  [id number] Date of Evaluation:  01/14/2020 Chief Complaint:  Exacerbation of Psychosis * see addendum below (dated 03/05/2020)  Principal Diagnosis: Exacerbation of Psychosis Diagnosis:  Exacerbation of Psychosis History of Present Illness: From MD's admission SRA: 54 year old female, presented to ED on 3/18.  Initially reported numbness on right side of her body but was not noted to be lateralized, symptoms were felt to be psychogenic and was medically cleared.  She also reported thoughts that she is being "cyber stalked ", harassed, and reported thinking that she is being video/audio taped, feeling unsafe. As per chart notes earlier today patient presented overtly paranoid, verbally aggressive/accusatory towards staff/peers, requiring as needed medication. Currently patient presents comfortable in bed, drowsy, participates in session but falls asleep at times.  Easily awoken by calling her name.  She is oriented x3.  She presents with paranoid ideations. She reports " people put me in 2 way video ", " it is audio, it is video but it is also so much more " "I was trying to send the information to the police but the wrong information was sent". Currently denies hallucinations.  Patient has a prior history of psychiatric illness, has been diagnosed with schizophrenia.  She was recently admitted to [facility] from 3/1-3/4, for psychotic/paranoid symptoms.  At that time was discharged on benztropine 0.5 mg twice daily, Neurontin 300 mg 3 times daily, Invega Sustenna 156 mg monthly with an initial IM injection scheduled for 3/11, risperidone 6 mg nightly. At this time it is unclear whether patient received said IM injection Currently denies alcohol or drug abuse.  Admission BAL/UDS negative. Medical history-reports past history of hypertension, DM.  Apparently patient has not been taking any antihypertensive or diabetic  medications .  3/2 hemoglobin A1c is 6.3.  318 nonfasting serum glucose is 120. BP today 154/92 with a pulse of 79.  Associated Signs/Symptoms: Depression Symptoms:  denies (Hypo) Manic Symptoms:  Impulsivity, Irritable Mood, Labiality of Mood, Anxiety Symptoms:  Excessive Worry, Psychotic Symptoms:  Delusions, Paranoia, PTSD Symptoms: Denies Total Time spent with patient: 30 minutes  Past Psychiatric History: History of schizophrenia with numerous hospitalizations, most recently discharged from [facility] on 12/26/19, discharged on Invega Sustenna, Risperdal 6 mg QHS, Neurontin 300 mg TID, Cogentin 0.5 mg BID, and Restoril 30 mg QHS.  Is the patient at risk to self? Yes.    Has the patient been a risk to self in the past 6 months? Yes.    Has the patient been a risk to self within the distant past? Yes.    Is the patient a risk to others? No.  Has the patient been a risk to others in the past 6 months? No.  Has the patient been a risk to others within the distant past? No.   Prior Inpatient Therapy:   Prior Outpatient Therapy:    Alcohol Screening: 1. How often do you have a drink containing alcohol?: Never 2. How many drinks containing alcohol do you have on a typical day when you are drinking?: 1 or 2 3. How often do you have six or more drinks on one occasion?: Never AUDIT-C Score: 0 4. How often during the last year have you found that you were not able to stop drinking once you had started?: Never 5. How often during the last year have you failed to do what was normally expected from you becasue of drinking?: Never 6. How often during the last year  have you needed a first drink in the morning to get yourself going after a heavy drinking session?: Never 7. How often during the last year have you had a feeling of guilt of remorse after drinking?: Never 8. How often during the last year have you been unable to remember what happened the night before because you had been drinking?:  Never 9. Have you or someone else been injured as a result of your drinking?: No 10. Has a relative or friend or a doctor or another health worker been concerned about your drinking or suggested you cut down?: No Alcohol Use Disorder Identification Test Final Score (AUDIT): 0 Substance Abuse History in the last 12 months:  No. Consequences of Substance Abuse: NA Previous Psychotropic Medications: Yes  Psychological Evaluations: No  Past Medical History:  Past Medical History:  Diagnosis Date  . Anxiety   . Delusional disorder ([place])   . Diabetes mellitus without complication ([place])   . Homelessness   . Hypertension   . Major depressive disorder, recurrent, severe with psychotic features ([place])   . Overdose   . Paranoid schizophrenia ([place])   . Schizophrenia ([place])   . Tremors of nervous system    History reviewed. No pertinent surgical history. Family History:  Family History  Problem Relation Age of Onset  . Hypertension Mother   . Diabetes Mother   . Aneurysm Mother   . Hypertension Father   . Diabetes Father   . Hypertension Sister   . Diabetes Sister   . Hypertension Other   . Diabetes Other    Family Psychiatric  History: Denies Tobacco Screening:   Social History:  Social History   Substance and Sexual Activity  Alcohol Use No     Social History   Substance and Sexual Activity  Drug Use No    Additional Social History:                           Allergies:  No Known Allergies Lab Results:  Results for orders placed or performed during the hospital encounter of 01/12/20 (from the past 48 hour(s))  Comprehensive metabolic panel     Status: Abnormal   Collection Time: 01/12/20  4:32 PM  Result Value Ref Range   Sodium 141 135 - 145 mmol/L   Potassium 3.6 3.5 - 5.1 mmol/L   Chloride 107 98 - 111 mmol/L   CO2 25 22 - 32 mmol/L   Glucose, Bld 120 (H) 70 - 99 mg/dL    Comment: Glucose reference range applies only to samples taken after fasting for at  least 8 hours.   BUN 17 6 - 20 mg/dL   Creatinine, Ser 0.83 0.44 - 1.00 mg/dL   Calcium 9.1 8.9 - 10.3 mg/dL   Total Protein 7.4 6.5 - 8.1 g/dL   Albumin 3.9 3.5 - 5.0 g/dL   AST 20 15 - 41 U/L   ALT 20 0 - 44 U/L   Alkaline Phosphatase 104 38 - 126 U/L   Total Bilirubin 0.1 (L) 0.3 - 1.2 mg/dL   GFR calc non Af Amer >60 >60 mL/min   GFR calc Af Amer >60 >60 mL/min   Anion gap 9 5 - 15    Comment: Performed at [facility], [address] [place], [place] [address]  Ethanol     Status: None   Collection Time: 01/12/20  4:32 PM  Result Value Ref Range   Alcohol, Ethyl (  B) <10 <10 mg/dL    Comment: (NOTE) Lowest detectable limit for serum alcohol is 10 mg/dL. For medical purposes only. Performed at [facility], [address] [place], [place] [address]   Urine rapid drug screen (hosp performed)     Status: None   Collection Time: 01/12/20  4:32 PM  Result Value Ref Range   Opiates NONE DETECTED NONE DETECTED   Cocaine NONE DETECTED NONE DETECTED   Benzodiazepines NONE DETECTED NONE DETECTED   Amphetamines NONE DETECTED NONE DETECTED   Tetrahydrocannabinol NONE DETECTED NONE DETECTED   Barbiturates NONE DETECTED NONE DETECTED    Comment: (NOTE) DRUG SCREEN FOR MEDICAL PURPOSES ONLY.  IF CONFIRMATION IS NEEDED FOR ANY PURPOSE, NOTIFY LAB WITHIN 5 DAYS. LOWEST DETECTABLE LIMITS FOR URINE DRUG SCREEN Drug Class                     Cutoff (ng/mL) Amphetamine and metabolites    1000 Barbiturate and metabolites    200 Benzodiazepine                 174 Tricyclics and metabolites     300 Opiates and metabolites        300 Cocaine and metabolites        300 THC                            50 Performed at [facility], [address] [place], [place] [address]   Pregnancy, urine     Status: None   Collection Time: 01/12/20  4:32 PM  Result Value Ref Range   Preg Test, Ur NEGATIVE NEGATIVE    Comment:        THE SENSITIVITY OF  THIS METHODOLOGY IS >20 mIU/mL. Performed at [facility], [address] [place], [place] [address]   CBC     Status: Abnormal   Collection Time: 01/12/20  4:32 PM  Result Value Ref Range   WBC 5.9 4.0 - 10.5 K/uL   RBC 4.38 3.87 - 5.11 MIL/uL   Hemoglobin 11.4 (L) 12.0 - 15.0 g/dL   HCT 37.2 36.0 - 46.0 %   MCV 84.9 80.0 - 100.0 fL   MCH 26.0 26.0 - 34.0 pg   MCHC 30.6 30.0 - 36.0 g/dL   RDW 14.0 11.5 - 15.5 %   Platelets 286 150 - 400 K/uL   nRBC 0.0 0.0 - 0.2 %    Comment: Performed at [facility], [address] [place], [place] [address]  Respiratory Panel by RT PCR (Flu A&B, Covid) - Nasopharyngeal Swab     Status: None   Collection Time: 01/12/20  4:32 PM   Specimen: Nasopharyngeal Swab  Result Value Ref Range   SARS Coronavirus 2 by RT PCR NEGATIVE NEGATIVE    Comment: (NOTE) SARS-CoV-2 target nucleic acids are NOT DETECTED. The SARS-CoV-2 RNA is generally detectable in upper respiratoy specimens during the acute phase of infection. The lowest concentration of SARS-CoV-2 viral copies this assay can detect is 131 copies/mL. A negative result does not preclude SARS-Cov-2 infection and should not be used as the sole basis for treatment or other patient management decisions. A negative result may occur with  improper specimen collection/handling, submission of specimen other than nasopharyngeal swab, presence of viral mutation(s) within the areas targeted by this assay, and inadequate number of viral copies (<131 copies/mL). A negative  result must be combined with clinical observations, patient history, and epidemiological information. The expected result is Negative. Fact Sheet for Patients:  PinkCheek.be Fact Sheet for Healthcare Providers:  GravelBags.it This test is not yet ap proved or cleared by the [place] FDA and  has been authorized for detection and/or diagnosis of  SARS-CoV-2 by FDA under an Emergency Use Authorization (EUA). This EUA will remain  in effect (meaning this test can be used) for the duration of the COVID-19 declaration under Section 564(b)(1) of the Act, 21 U.S.C. section 360bbb-3(b)(1), unless the authorization is terminated or revoked sooner.    Influenza A by PCR NEGATIVE NEGATIVE   Influenza B by PCR NEGATIVE NEGATIVE    Comment: (NOTE) The Xpert Xpress SARS-CoV-2/FLU/RSV assay is intended as an aid in  the diagnosis of influenza from Nasopharyngeal swab specimens and  should not be used as a sole basis for treatment. Nasal washings and  aspirates are unacceptable for Xpert Xpress SARS-CoV-2/FLU/RSV  testing. Fact Sheet for Patients: PinkCheek.be Fact Sheet for Healthcare Providers: GravelBags.it This test is not yet approved or cleared by the [place] FDA and  has been authorized for detection and/or diagnosis of SARS-CoV-2 by  FDA under an Emergency Use Authorization (EUA). This EUA will remain  in effect (meaning this test can be used) for the duration of the  Covid-19 declaration under Section 564(b)(1) of the Act, 21  U.S.C. section 360bbb-3(b)(1), unless the authorization is  terminated or revoked. Performed at [facility], [address] [place], [place] [address]     Blood Alcohol level:  Lab Results  Component Value Date   [facility] <10 01/12/2020   ETH <10 99/35/7017    Metabolic Disorder Labs:  Lab Results  Component Value Date   HGBA1C 6.3 (H) 12/27/2019   MPG 134.11 12/27/2019   MPG 142.72 06/05/2019   Lab Results  Component Value Date   PROLACTIN 60.4 (H) 12/27/2019   Lab Results  Component Value Date   CHOL 226 (H) 12/27/2019   TRIG 88 12/27/2019   HDL 49 12/27/2019   CHOLHDL 4.6 12/27/2019   VLDL 18 12/27/2019   LDLCALC 159 (H) 12/27/2019   LDLCALC 139 (H) 06/05/2019    Current Medications: Current  Facility-Administered Medications  Medication Dose Route Frequency Provider Last Rate Last Admin  . acetaminophen (TYLENOL) tablet 650 mg  650 mg Oral Q6H PRN Lindon Romp A, NP      . alum & mag hydroxide-simeth (MAALOX/MYLANTA) 200-200-20 MG/5ML suspension 30 mL  30 mL Oral Q4H PRN Lindon Romp A, NP      . benztropine (COGENTIN) tablet 0.5 mg  0.5 mg Oral BID Lindon Romp A, NP   0.5 mg at 01/14/20 0819  . gabapentin (NEURONTIN) capsule 300 mg  300 mg Oral TID Lindon Romp A, NP   300 mg at 01/14/20 1133  . hydrOXYzine (ATARAX/VISTARIL) tablet 25 mg  25 mg Oral TID PRN Lindon Romp A, NP      . LORazepam (ATIVAN) tablet 1 mg  1 mg Oral Q6H PRN Connye Burkitt, NP   1 mg at 01/14/20 1133   Or  . LORazepam (ATIVAN) injection 1 mg  1 mg Intramuscular Q6H PRN Connye Burkitt, NP      . magnesium hydroxide (MILK OF MAGNESIA) suspension 30 mL  30 mL Oral Daily PRN Lindon Romp A, NP      . OLANZapine zydis (ZYPREXA) disintegrating tablet 10 mg  10 mg Oral Q8H PRN  Connye Burkitt, NP   10 mg at 01/14/20 1133   And  . ziprasidone (GEODON) injection 20 mg  20 mg Intramuscular PRN Connye Burkitt, NP      . risperiDONE (RISPERDAL) tablet 3 mg  3 mg Oral Once Lindon Romp A, NP      . risperiDONE (RISPERDAL) tablet 6 mg  6 mg Oral QHS Lindon Romp A, NP      . temazepam (RESTORIL) capsule 30 mg  30 mg Oral QHS PRN Rozetta Nunnery, NP       PTA Medications: Medications Prior to Admission  Medication Sig Dispense Refill Last Dose  . benztropine (COGENTIN) 0.5 MG tablet Take 1 tablet (0.5 mg total) by mouth 2 (two) times daily. 60 tablet 2   . gabapentin (NEURONTIN) 300 MG capsule TAKE 1 CAPSULE(300 MG) BY MOUTH THREE TIMES DAILY 90 capsule 4   . metoCLOPramide (REGLAN) 10 MG tablet Take 1 tablet (10 mg total) by mouth as needed for nausea. Take 1 tablet as needed for nausea and vomitting (Patient not taking: Reported on 12/27/2019) 30 tablet 0   . paliperidone (INVEGA SUSTENNA) 156 MG/ML SUSY injection Inject  1 mL (156 mg total) into the muscle once for 1 dose. (Due on 01-05-20): For mood control 1 mL 0   . [START ON 02/02/2020] Paliperidone ER (INVEGA SUSTENNA) injection Inject 117 mg into the muscle every 28 (twenty-eight) days. (Due on 02-02-20): For mood control 0.9 mL 0   . risperiDONE (RISPERDAL) 3 MG tablet Take 2 tablets (6 mg total) by mouth at bedtime. 60 tablet 1   . temazepam (RESTORIL) 30 MG capsule Take 1 capsule (30 mg total) by mouth at bedtime. 30 capsule 1     Musculoskeletal: Strength & Muscle Tone: within normal limits Gait & Station: normal Patient leans: N/A  Psychiatric Specialty Exam: Physical Exam  Nursing note and vitals reviewed. Constitutional: She is oriented to person, place, and time. She appears well-developed and well-nourished.  Cardiovascular: Normal rate.  Respiratory: Effort normal.  Neurological: She is alert and oriented to person, place, and time.    Review of Systems  Constitutional: Negative.   Respiratory: Negative for cough and shortness of breath.   Psychiatric/Behavioral: Positive for agitation, behavioral problems and sleep disturbance. Negative for dysphoric mood, hallucinations, self-injury and suicidal ideas. The patient is not nervous/anxious and is not hyperactive.     Blood pressure (!) 135/103, pulse (!) 103, temperature (!) 97.5 F (36.4 C), temperature source Oral, resp. rate 20, height 5' 5.51" (1.664 m), weight 121.6 kg, last menstrual period 10/29/2011, SpO2 100 %.Body mass index is 43.9 kg/m.  General Appearance: Disheveled  Eye Contact:  Poor  Speech:  Slow  Volume:  Decreased  Mood:  Anxious  Affect:  Congruent  Thought Process:  Coherent  Orientation:  Full (Time, Place, and Person)  Thought Content:  Delusions and Paranoid Ideation  Suicidal Thoughts:  No  Homicidal Thoughts:  No  Memory:  Immediate;   Fair Recent;   Fair Remote;   Fair  Judgement:  Impaired  Insight:  Lacking  Psychomotor Activity:  Decreased   Concentration:  Concentration: Fair and Attention Span: Poor  Recall:  [employer] of Knowledge:  Fair  Language:  Fair  Akathisia:  No  Handed:  Right  AIMS (if indicated):     Assets:  Communication Skills Leisure Time Resilience  ADL's:  Intact  Cognition:  WNL  Sleep:  Number of Hours: 2.5  Treatment Plan Summary: Daily contact with patient to assess and evaluate symptoms and progress in treatment and Medication management   Inpatient hospitalization.  See MD's admission SRA for medication management.  Patient will participate in the therapeutic group milieu.  Discharge disposition in progress.   Observation Level/Precautions:  15 minute checks  Laboratory:  Reviewed  Psychotherapy:  Group therapy  Medications:  See MAR  Consultations:  PRN  Discharge Concerns:  Safety and stabilization  Estimated LOS: 3-5 days  Other:     Physician Treatment Plan for Primary Diagnosis: <principal problem not specified> Long Term Goal(s): Improvement in symptoms so as ready for discharge  Short Term Goals: Ability to identify changes in lifestyle to reduce recurrence of condition will improve, Ability to verbalize feelings will improve and Ability to disclose and discuss suicidal ideas  Physician Treatment Plan for Secondary Diagnosis: Active Problems:   Schizophrenia ([place])  Long Term Goal(s): Improvement in symptoms so as ready for discharge  Short Term Goals: Ability to demonstrate self-control will improve and Ability to identify and develop effective coping behaviors will improve  I certify that inpatient services furnished can reasonably be expected to improve the patient's condition.    Connye Burkitt, NP 3/20/20213:51 PM   I have discussed case with NP and have met with patient  Agree with NP note and assessment  54 year old female, no children, lives with mother. 54 year old female, presented to ED on 3/18.  Initially reported numbness on right side of her body but  was not noted to be lateralized, symptoms were felt to be psychogenic and was medically cleared.  She also reported thoughts that she is being "cyber stalked ", harassed, and reported thinking that she is being video/audio taped, feeling unsafe. As per chart notes earlier today patient presented overtly paranoid, verbally aggressive/accusatory towards staff/peers, requiring as needed medication. Currently patient presents comfortable in bed, drowsy, participates in session but falls asleep at times.  Easily awoken by calling her name.  She is oriented x3.  She presents with paranoid ideations. She reports " people put me in 2 way video ", " it is audio, it is video but it is also so much more " "I was trying to send the information to the police but the wrong information was sent". Currently denies hallucinations.  Patient has a prior history of psychiatric illness, has been diagnosed with schizophrenia.  She was recently admitted to [facility] from 3/1-3/4, for psychotic/paranoid symptoms.  At that time was discharged on benztropine 0.5 mg twice daily, Neurontin 300 mg 3 times daily, Invega Sustenna 156 mg monthly with an initial IM injection scheduled for 3/11, Risperidone 6 mg nightly, Restoril 30 mgrs QHS.  Patient reports she has not been taking her psychiatric medications recently At this time it is unclear whether patient received said IM injection Currently denies alcohol or drug abuse.  Admission BAL/UDS negative. Medical history-reports past history of hypertension, DM.  Apparently patient has not been taking any antihypertensive or diabetic medications, but states she was told by her PCP she may be started o these soon..   3/2 hemoglobin A1c is 6.3.  3/18 nonfasting serum glucose is 120. CBG today at 16,00 125.  BP today 154/92 with a pulse of 79.  Plan-inpatient psychiatric admission. Will resume  Risperidone at 2 mgrs BID initially , Restoril  at 15 mgrs QHS PRN for insomnia, Cogentin at 0.5  mgrs BID,  Neurontin at 100 mgrs TID . Titrate gradually as tolerated . Check EKG  to monitor QTc      Addendum -  03/05/2020 at 3,30 PM  Received written information from Health Information Management Department that patient sent written documentation to MR department expressing that she does not agree with a  diagnosis of Schizophrenia . Her letter states she feels this  diagnosis is not correct and that it  was initially made as a result of her filing a harrassment complaint in 2006.  In reviewing chart, she has history of prior inpatient psychiatric treatment episodes and a  prior history of psychosis. She has been diagnosed with Delusional Disorder and with Schizophrenia during prior treatment episodes ( before this clinician  evaluated/ treated  Patient)  and I note for the record that I cannot change other clinicians' assessments or notes, and in particular those which were written before I assessed patient.  Based on her presentation/ assessment on 01/14/2020 and on  Ms. Feinberg's written concerns about her diagnosis as above , will addend admission diagnosis to [facility] and  Delusional Disorder by history .  Gabriel Earing MD

## 2020-01-14 NOTE — Progress Notes (Signed)
   01/14/20 1100  Psych Admission Type (Psych Patients Only)  Admission Status Involuntary  Psychosocial Assessment  Patient Complaints Agitation;Anxiety  Eye Contact Fair  Facial Expression Anxious  Affect Anxious;Fearful  Speech Logical/coherent  Interaction Assertive  Motor Activity Slow  Appearance/Hygiene In scrubs  Behavior Characteristics Cooperative;Anxious  Mood Anxious  Aggressive Behavior  Targets Other (Comment) ("A man at work")  Engineer, production WDL  Content Blaming others;Delusions  Delusions Paranoid  Perception WDL  Hallucination None reported or observed  Judgment Poor  Confusion WDL  Danger to Self  Current suicidal ideation? Denies  Danger to Others  Danger to Others None reported or observed

## 2020-01-14 NOTE — Progress Notes (Addendum)
Patient remains delusional, paranoid, and disruptive in am group- verbally aggressive, intimidating and accusatory toward peer.Patient agreed to take prn medication to help her relax

## 2020-01-14 NOTE — [facility] Suicide Risk Assessment (Addendum)
[facility] Admission Suicide Risk Assessment   Nursing information obtained from:  Patient Demographic factors:  Low socioeconomic status, Living alone, Unemployed Current Mental Status:  NA Loss Factors:  (HOMELESSNESS) Historical Factors:  Impulsivity Risk Reduction Factors:  Religious beliefs about death  Total Time spent with patient: 45 minutes Principal Problem:  Diagnosis:  Active Problems:   Schizophrenia ([place])  Subjective Data:   Continued Clinical Symptoms:  Alcohol Use Disorder Identification Test Final Score (AUDIT): 0 The "Alcohol Use Disorders Identification Test", Guidelines for Use in Primary Care, Second Edition.  World Pharmacologist [facility]). Score between 0-7:  no or low risk or alcohol related problems. Score between 8-15:  moderate risk of alcohol related problems. Score between 16-19:  high risk of alcohol related problems. Score 20 or above:  warrants further diagnostic evaluation for alcohol dependence and treatment.   CLINICAL FACTORS:  54 year old female, no children, lives with mother. 54 year old female, presented to ED on 3/18.  Initially reported numbness on right side of her body but was not noted to be lateralized, symptoms were felt to be psychogenic and was medically cleared.  She also reported thoughts that she is being "cyber stalked ", harassed, and reported thinking that she is being video/audio taped, feeling unsafe. As per chart notes earlier today patient presented overtly paranoid, verbally aggressive/accusatory towards staff/peers, requiring as needed medication. Currently patient presents comfortable in bed, drowsy, participates in session but falls asleep at times.  Easily awoken by calling her name.  She is oriented x3.  She presents with paranoid ideations. She reports " people put me in 2 way video ", " it is audio, it is video but it is also so much more " "I was trying to send the information to the police but the wrong information was  sent". Currently denies hallucinations.  Patient has a prior history of psychiatric illness, has been diagnosed with schizophrenia.  She was recently admitted to [facility] from 3/1-3/4, for psychotic/paranoid symptoms.  At that time was discharged on benztropine 0.5 mg twice daily, Neurontin 300 mg 3 times daily, Invega Sustenna 156 mg monthly with an initial IM injection scheduled for 3/11, Risperidone 6 mg nightly, Restoril 30 mgrs QHS.  Patient reports she has not been taking her psychiatric medications recently At this time it is unclear whether patient received said IM injection Currently denies alcohol or drug abuse.  Admission BAL/UDS negative. Medical history-reports past history of hypertension, DM.  Apparently patient has not been taking any antihypertensive or diabetic medications, but states she was told by her PCP she may be started o these soon..   3/2 hemoglobin A1c is 6.3.  3/18 nonfasting serum glucose is 120. CBG today at 16,00 125.  BP today 154/92 with a pulse of 79.  Plan-inpatient psychiatric admission. Will resume  Risperidone at 2 mgrs BID initially , Restoril  at 15 mgrs QHS PRN for insomnia, Cogentin at 0.5 mgrs BID,  Neurontin at 100 mgrs TID . Titrate gradually as tolerated . Check EKG to monitor QTc     Musculoskeletal: Strength & Muscle Tone: within normal limits Gait & Station: normal Patient leans: N/A  Psychiatric Specialty Exam: Physical Exam  Review of Systems denies headache, denies chest pain, shortness of breath at room air at this time does not endorse numbness or hyperesthesias  Blood pressure (!) 135/103, pulse (!) 103, temperature (!) 97.5 F (36.4 C), temperature source Oral, resp. rate 20, height 5' 5.51" (1.664 m), weight 121.6 kg, last menstrual period 10/29/2011,  SpO2 100 %.Body mass index is 43.9 kg/m.  General Appearance: Fairly Groomed  Eye Contact:  Fair  Speech:  Normal Rate  Volume:  Decreased  Mood:  Denies depression, states mood "ok"   Affect:  Blunted  Thought Process:  Linear and Descriptions of Associations: Circumstantial  Orientation:  Other:  Currently drowsy (following as needed medication) but easily awoken by calling her name.  She is oriented x3.  Thought Content:  Denies hallucinations.  Does not currently present internally preoccupied.  Paranoid/persecutory ideations as above  Suicidal Thoughts:  No currently denies suicidal or self-injurious ideations  Homicidal Thoughts:  No  Memory:  Recent and remote grossly intact  Judgement:  Other:  Limited  Insight:  Limited  Psychomotor Activity:  At this time calm, in bed, without psychomotor agitation or restlessness  Concentration:  Concentration: Fair and Attention Span: Fair  Recall:  Good  Fund of Knowledge:  Good  Language:  Fair  Akathisia:  Negative  Handed:  Right  AIMS (if indicated):     Assets:  Desire for Improvement Resilience  ADL's:  Intact  Cognition:  WNL  Sleep:  Number of Hours: 2.5      COGNITIVE FEATURES THAT CONTRIBUTE TO RISK:  Closed-mindedness, Loss of executive function and Polarized thinking    SUICIDE RISK:   Moderate:  Frequent suicidal ideation with limited intensity, and duration, some specificity in terms of plans, no associated intent, good self-control, limited dysphoria/symptomatology, some risk factors present, and identifiable protective factors, including available and accessible social support.  PLAN OF CARE: Patient will be admitted to inpatient psychiatric unit for stabilization and safety. Will provide and encourage milieu participation. Provide medication management and maked adjustments as needed.  Will follow daily.    I certify that inpatient services furnished can reasonably be expected to improve the patient's condition.   Jenne Campus, MD 01/14/2020, 3:32 PM

## 2020-01-15 DIAGNOSIS — F200 Paranoid schizophrenia: Principal | ICD-10-CM

## 2020-01-15 MED ORDER — GABAPENTIN 300 MG PO CAPS
300.0000 mg | ORAL_CAPSULE | Freq: Three times a day (TID) | ORAL | Status: DC
  Administered 2020-01-15 – 2020-01-18 (×9): 300 mg via ORAL
  Filled 2020-01-15 (×16): qty 1

## 2020-01-15 MED ORDER — RISPERIDONE 3 MG PO TABS
3.0000 mg | ORAL_TABLET | Freq: Two times a day (BID) | ORAL | Status: DC
  Administered 2020-01-15 – 2020-01-18 (×6): 3 mg via ORAL
  Filled 2020-01-15 (×12): qty 1

## 2020-01-15 NOTE — Progress Notes (Addendum)
[facility] MD Progress Note  01/15/2020 12:45 PM [name]  MRN:  [id number] Subjective:  "I'm ok."  [name] found lying in bed. She appears calm on assessment but is significantly delusional. She reports calling the police prior to admission because the people she had filed harassment charges on started coming around her. She explained to the police that she was being monitored on an "audiovisual system," and the people on the other end were following her. She has apparently made repeated calls to the police, and they told her they will not answer her calls anymore. She believes she is still being monitored on the audiovisual system but states that she feels safer in the hospital.  She reports that she had an apartment in [place] but left it due to the people monitoring her on the audiovisual system. She reports she has lived in her car since then and stayed in [place], [place], [place], and [place] in an effort to escape from the people who are following her. She reports the audiovisual system is controlled by a man named "[name]" who went to church with her. She became angry when [name] married another girl at [employer], and she believes [name] has been following her since then. She states that she became agitated on the unit yesterday because she thought another patient was [name]. Patient was reassured there was no one by that name on the unit. She endorses SI but denies any plan or intent and contracts for safety. Denies HI/AVH.   From admission H&P: 54 year old female, reported thoughts that she is being "cyber stalked ", harassed,and reported thinking that she is being video/audio taped,feeling unsafe. As per chart notes earlier today patient presented overtly paranoid, verbally aggressive/accusatory towards staff/peers,requiring as needed medication. Patient has a prior history of psychiatric illness, has been diagnosed with schizophrenia.   Principal Problem: <principal problem  not specified> Diagnosis: Active Problems:   Schizophrenia ([place])  Total Time spent with patient: 15 minutes  Past Psychiatric History: See admission H&P  Past Medical History:  Past Medical History:  Diagnosis Date  . Anxiety   . Delusional disorder ([place])   . Diabetes mellitus without complication ([place])   . Homelessness   . Hypertension   . Major depressive disorder, recurrent, severe with psychotic features ([place])   . Overdose   . Paranoid schizophrenia ([place])   . Schizophrenia ([place])   . Tremors of nervous system    History reviewed. No pertinent surgical history. Family History:  Family History  Problem Relation Age of Onset  . Hypertension Mother   . Diabetes Mother   . Aneurysm Mother   . Hypertension Father   . Diabetes Father   . Hypertension Sister   . Diabetes Sister   . Hypertension Other   . Diabetes Other    Family Psychiatric  History: See admission H&P Social History:  Social History   Substance and Sexual Activity  Alcohol Use No     Social History   Substance and Sexual Activity  Drug Use No    Social History   Socioeconomic History  . Marital status: Single    Spouse name: Not on file  . Number of children: Not on file  . Years of education: Not on file  . Highest education level: Not on file  Occupational History  . Not on file  Tobacco Use  . Smoking status: Never Smoker  . Smokeless tobacco: Never Used  Substance and Sexual Activity  . Alcohol use: No  .  Drug use: No  . Sexual activity: Never  Other Topics Concern  . Not on file  Social History Narrative   Lives alone in an apartment. Has steps up to apartment      Right handed      Highest level of edu- Some college      Unemployed   Social Determinants of Health   Financial Resource Strain:   . Difficulty of Paying Living Expenses:   Food Insecurity:   . Worried About Charity fundraiser in the Last Year:   . Arboriculturist in the Last Year:   Transportation Needs:    . Film/video editor (Medical):   Marland Kitchen Lack of Transportation (Non-Medical):   Physical Activity:   . Days of Exercise per Week:   . Minutes of Exercise per Session:   Stress:   . Feeling of Stress :   Social Connections:   . Frequency of Communication with Friends and Family:   . Frequency of Social Gatherings with Friends and Family:   . Attends Religious Services:   . Active Member of Clubs or Organizations:   . Attends Archivist Meetings:   Marland Kitchen Marital Status:    Additional Social History:                         Sleep: Good  Appetite:  Good  Current Medications: Current Facility-Administered Medications  Medication Dose Route Frequency Provider Last Rate Last Admin  . acetaminophen (TYLENOL) tablet 650 mg  650 mg Oral Q6H PRN Lindon Romp A, NP      . alum & mag hydroxide-simeth (MAALOX/MYLANTA) 200-200-20 MG/5ML suspension 30 mL  30 mL Oral Q4H PRN Lindon Romp A, NP      . benztropine (COGENTIN) tablet 0.5 mg  0.5 mg Oral BID Lindon Romp A, NP   0.5 mg at 01/15/20 1008  . gabapentin (NEURONTIN) capsule 300 mg  300 mg Oral TID Connye Burkitt, NP      . hydrOXYzine (ATARAX/VISTARIL) tablet 25 mg  25 mg Oral TID PRN Lindon Romp A, NP      . LORazepam (ATIVAN) tablet 1 mg  1 mg Oral Q6H PRN Connye Burkitt, NP   1 mg at 01/14/20 1133   Or  . LORazepam (ATIVAN) injection 1 mg  1 mg Intramuscular Q6H PRN Connye Burkitt, NP      . magnesium hydroxide (MILK OF MAGNESIA) suspension 30 mL  30 mL Oral Daily PRN Lindon Romp A, NP      . OLANZapine zydis (ZYPREXA) disintegrating tablet 10 mg  10 mg Oral Q8H PRN Connye Burkitt, NP   10 mg at 01/14/20 1133   And  . ziprasidone (GEODON) injection 20 mg  20 mg Intramuscular PRN Connye Burkitt, NP      . risperiDONE (RISPERDAL) tablet 3 mg  3 mg Oral BID Connye Burkitt, NP      . temazepam (RESTORIL) capsule 15 mg  15 mg Oral QHS PRN Ishika Chesterfield, Myer Peer, MD        Lab Results:  Results for orders placed or  performed during the hospital encounter of 01/14/20 (from the past 48 hour(s))  Glucose, capillary     Status: Abnormal   Collection Time: 01/14/20  4:14 PM  Result Value Ref Range   Glucose-Capillary 125 (H) 70 - 99 mg/dL    Comment: Glucose reference range applies only to samples taken after fasting  for at least 8 hours.    Blood Alcohol level:  Lab Results  Component Value Date   ETH <10 01/12/2020   ETH <10 0000000    Metabolic Disorder Labs: Lab Results  Component Value Date   HGBA1C 6.3 (H) 12/27/2019   MPG 134.11 12/27/2019   MPG 142.72 06/05/2019   Lab Results  Component Value Date   PROLACTIN 60.4 (H) 12/27/2019   Lab Results  Component Value Date   CHOL 226 (H) 12/27/2019   TRIG 88 12/27/2019   HDL 49 12/27/2019   CHOLHDL 4.6 12/27/2019   VLDL 18 12/27/2019   LDLCALC 159 (H) 12/27/2019   LDLCALC 139 (H) 06/05/2019    Physical Findings: AIMS: Facial and Oral Movements Muscles of Facial Expression: None, normal Lips and Perioral Area: None, normal Jaw: None, normal Tongue: None, normal,Extremity Movements Upper (arms, wrists, hands, fingers): None, normal Lower (legs, knees, ankles, toes): None, normal, Trunk Movements Neck, shoulders, hips: None, normal, Overall Severity Severity of abnormal movements (highest score from questions above): None, normal Incapacitation due to abnormal movements: None, normal Patient's awareness of abnormal movements (rate only patient's report): No Awareness, Dental Status Current problems with teeth and/or dentures?: No Does patient usually wear dentures?: No  CIWA:    COWS:     Musculoskeletal: Strength & Muscle Tone: within normal limits Gait & Station: normal Patient leans: N/A  Psychiatric Specialty Exam: Physical Exam  Nursing note and vitals reviewed. Constitutional: She is oriented to person, place, and time. She appears well-developed and well-nourished.  Cardiovascular: Normal rate.  Respiratory:  Effort normal.  Neurological: She is alert and oriented to person, place, and time.    Review of Systems  Constitutional: Negative.   Respiratory: Negative for cough and shortness of breath.   Psychiatric/Behavioral: Positive for dysphoric mood and suicidal ideas. Negative for agitation, behavioral problems, hallucinations, self-injury and sleep disturbance. The patient is nervous/anxious. The patient is not hyperactive.     Blood pressure (!) 135/103, pulse (!) 103, temperature (!) 97.5 F (36.4 C), temperature source Oral, resp. rate 20, height 5' 5.51" (1.664 m), weight 121.6 kg, last menstrual period 10/29/2011, SpO2 100 %.Body mass index is 43.9 kg/m.  General Appearance: Casual  Eye Contact:  Fair  Speech:  Normal Rate  Volume:  Decreased  Mood:  Anxious  Affect:  Congruent  Thought Process:  Coherent  Orientation:  Full (Time, Place, and Person)  Thought Content:  Delusions and Paranoid Ideation  Suicidal Thoughts:  Yes.  without intent/plan  Homicidal Thoughts:  No  Memory:  Immediate;   Good Recent;   Good Remote;   Good  Judgement:  Impaired  Insight:  Lacking  Psychomotor Activity:  Decreased  Concentration:  Concentration: Fair and Attention Span: Fair  Recall:  [employer] of Knowledge:  Fair  Language:  Good  Akathisia:  No  Handed:  Right  AIMS (if indicated):     Assets:  Communication Skills Desire for Improvement Leisure Time Resilience  ADL's:  Intact  Cognition:  WNL  Sleep:  Number of Hours: 6.5     Treatment Plan Summary: Daily contact with patient to assess and evaluate symptoms and progress in treatment and Medication management  Continue inpatient hospitalization.  Increase Risperdal to 3 mg PO BID for psychosis Increase Neurontin to 300 mg PO TID for agitation Continue Cogentin 0.5 mg PO BID for EPS Continue Vistaril 25 mg PO TID PRN anxiety Continue agitation protocol PRN agitation Continue Restoril 15 mg PO QHS  PRN insomnia  Patient  will participate in the therapeutic group milieu.  Discharge disposition in progress.   Connye Burkitt, NP 01/15/2020, 12:45 PM   Attest to NP note

## 2020-01-15 NOTE — Progress Notes (Signed)
   01/14/20 1930  COVID-19 Daily Checkoff  Have you had a fever (temp > 37.80C/100F)  in the past 24 hours?  No  If you have had runny nose, nasal congestion, sneezing in the past 24 hours, has it worsened? No  COVID-19 EXPOSURE  Have you traveled outside the state in the past 14 days? No  Have you been in contact with someone with a confirmed diagnosis of COVID-19 or PUI in the past 14 days without wearing appropriate PPE? No  Have you been living in the same home as a person with confirmed diagnosis of COVID-19 or a PUI (household contact)? No  Have you been diagnosed with COVID-19? No

## 2020-01-15 NOTE — Progress Notes (Signed)
   01/15/20 1500  Psych Admission Type (Psych Patients Only)  Admission Status Involuntary  Psychosocial Assessment  Patient Complaints None  Eye Contact Fair  Facial Expression Anxious  Affect Anxious;Fearful  Speech Logical/coherent;Elective mutism  Interaction Assertive  Motor Activity Slow  Appearance/Hygiene In scrubs  Behavior Characteristics Cooperative;Guarded  Aggressive Behavior  Targets Other (Comment) ("A man at work")  Engineer, production WDL  Content WDL  Delusions Paranoid  Perception WDL  Hallucination None reported or observed  Judgment Poor  Confusion Mild  Danger to Self  Current suicidal ideation? Denies  Danger to Others  Danger to Others None reported or observed

## 2020-01-15 NOTE — Progress Notes (Signed)
   01/15/20 2140  Psych Admission Type (Psych Patients Only)  Admission Status Involuntary  Psychosocial Assessment  Patient Complaints Suspiciousness;Confusion  Eye Contact Avoids  Facial Expression Anxious  Affect Anxious;Fearful  Speech Elective mutism  Interaction Assertive  Motor Activity Slow  Appearance/Hygiene In scrubs  Behavior Characteristics Guarded  Mood Preoccupied  Aggressive Behavior  Targets Other (Comment) ("A man at work")  Engineer, production WDL  Content WDL  Delusions Paranoid  Perception WDL  Hallucination None reported or observed  Judgment Poor  Confusion Mild  Danger to Self  Current suicidal ideation? Denies  Danger to Others  Danger to Others None reported or observed

## 2020-01-15 NOTE — Progress Notes (Signed)
   01/14/20 1930  Psych Admission Type (Psych Patients Only)  Admission Status Involuntary  Psychosocial Assessment  Patient Complaints None  Eye Contact Fair  Facial Expression Anxious  Affect Anxious;Fearful  Speech Logical/coherent;Elective mutism  Interaction Assertive  Motor Activity Slow  Appearance/Hygiene In scrubs  Behavior Characteristics Guarded  Mood Anxious;Labile  Aggressive Behavior  Targets Other (Comment) ("A man at work")  Engineer, production WDL  Content Blaming others;Delusions  Delusions Paranoid  Perception WDL  Hallucination None reported or observed  Judgment Poor  Confusion Mild  Danger to Self  Current suicidal ideation? Denies  Danger to Others  Danger to Others None reported or observed

## 2020-01-15 NOTE — Progress Notes (Signed)
Adult Psychoeducational Group Note  Date:  01/15/2020 Time:  10:40 PM  Group Topic/Focus:  Wrap-Up Group:   The focus of this group is to help patients review their daily goal of treatment and discuss progress on daily workbooks.  Participation Level:  Did Not Attend  Participation Quality:  Did Not Attend  Affect:  Did Not Attend  Cognitive:  Did Not Attend  Insight: None  Engagement in Group:  Did Not Attend  Modes of Intervention:  Did Not Attend  Additional Comments:  Pt did not attend evening wrap up group tonight.  Candy Sledge 01/15/2020, 10:40 PM

## 2020-01-16 MED ORDER — LORAZEPAM 2 MG/ML IJ SOLN
4.0000 mg | Freq: Once | INTRAMUSCULAR | Status: AC
  Administered 2020-01-16: 4 mg via INTRAMUSCULAR
  Filled 2020-01-16: qty 2

## 2020-01-16 MED ORDER — CARVEDILOL 25 MG PO TABS
25.0000 mg | ORAL_TABLET | Freq: Two times a day (BID) | ORAL | Status: DC
  Administered 2020-01-16 – 2020-01-18 (×4): 25 mg via ORAL
  Filled 2020-01-16 (×2): qty 1
  Filled 2020-01-16: qty 2
  Filled 2020-01-16 (×6): qty 1

## 2020-01-16 NOTE — Progress Notes (Signed)
Recreation Therapy Notes  Date: 3.22.21 Time: 10:00 Location: [address] Dayroom  Group Topic: Leisure Education  Goal Area(s) Addresses:  Patient will identify positive leisure activities.  Patient will identify one positive benefit of participation in leisure activities.   Intervention: Leisure Group Game  Activity: Pictionary.  Each patients was to get strip of paper from the container.  The patients draws what's on the paper on the board.  The remaining patients attempt to guess the picture.  Whoever made the correct guess, got the next turn.  Patients got one minute to draw the picture and guess what it is.  LRT is to keep the time and the score (if necessary).   Education:  Leisure Education, Dentist  Education Outcome: Acknowledges education/In group clarification offered/Needs additional education  Clinical Observations/Feedback: Patient did not attend group session.    [name], LRT/CTRS         [name] A 01/16/2020 12:22 PM

## 2020-01-16 NOTE — Progress Notes (Addendum)
Pt has multiple admissions to [facility], refuses referrals for follow up outpatient care.  Pt has been referred to [facility] during past admissions and this has been unsuccessful as well.  Pt has been homeless going back to at least the fall 2020 and reports she is currently living in her car.  Based on the above, APS report was made to [place] [facility] adult protective services stating that pt is in need of a legal guardian. Winferd Humphrey, MSW, LCSW Advanced Care Supervisor 01/16/2020 1:53 PM   Supervisor received call from Sharon [place], [phone].  She asked several more questions and informed supervisor that she would be screening this report in for investigation. Winferd Humphrey, MSW, LCSW Advanced Care Supervisor 01/16/2020 2:30 PM

## 2020-01-16 NOTE — Plan of Care (Signed)
Progress note  D: pt found in bed; compliant with medication administration. Pt continues to be confused and targeting other patients, thinking they are the person who is stalking them. Pt is also preoccupied with their housing paperwork, threatening to take legal action towards these people if they don't "hurry up". Pt has been screaming out multiple times throughout the day. Pt was provided anxiety medication for this. Pt denies si/hi/ah/vh and verbally agrees to approach staff if these become apparent or before harming themself/others while at [place].  A: Pt provided support and encouragement. Pt given medication per protocol and standing orders. Q74m safety checks implemented and continued.  R: Pt safe on the unit. Will continue to monitor.  Pt progressing in the following metrics  Problem: Education: Goal: Ability to state activities that reduce stress will improve Outcome: Not Progressing   Problem: Coping: Goal: Ability to identify and develop effective coping behavior will improve Outcome: Not Progressing   Problem: Self-Concept: Goal: Ability to identify factors that promote anxiety will improve Outcome: Not Progressing Goal: Level of anxiety will decrease Outcome: Not Progressing

## 2020-01-16 NOTE — Tx Team (Signed)
Interdisciplinary Treatment and Diagnostic Plan Update  01/16/2020 Time of Session: 9:00am [name] MRN: [id number]  Principal Diagnosis: <principal problem not specified>  Secondary Diagnoses: Active Problems:   Schizophrenia ([place])   Current Medications:  Current Facility-Administered Medications  Medication Dose Route Frequency Provider Last Rate Last Admin  . acetaminophen (TYLENOL) tablet 650 mg  650 mg Oral Q6H PRN Lindon Romp A, NP      . alum & mag hydroxide-simeth (MAALOX/MYLANTA) 200-200-20 MG/5ML suspension 30 mL  30 mL Oral Q4H PRN Lindon Romp A, NP      . benztropine (COGENTIN) tablet 0.5 mg  0.5 mg Oral BID Lindon Romp A, NP   0.5 mg at 01/16/20 0943  . carvedilol (COREG) tablet 25 mg  25 mg Oral BID WC Johnn Hai, MD      . gabapentin (NEURONTIN) capsule 300 mg  300 mg Oral TID Connye Burkitt, NP   300 mg at 01/16/20 0943  . hydrOXYzine (ATARAX/VISTARIL) tablet 25 mg  25 mg Oral TID PRN Lindon Romp A, NP   25 mg at 01/15/20 2130  . LORazepam (ATIVAN) tablet 1 mg  1 mg Oral Q6H PRN Connye Burkitt, NP   1 mg at 01/14/20 1133   Or  . LORazepam (ATIVAN) injection 1 mg  1 mg Intramuscular Q6H PRN Connye Burkitt, NP      . LORazepam (ATIVAN) injection 4 mg  4 mg Intramuscular Once Johnn Hai, MD      . magnesium hydroxide (MILK OF MAGNESIA) suspension 30 mL  30 mL Oral Daily PRN Lindon Romp A, NP      . OLANZapine zydis (ZYPREXA) disintegrating tablet 10 mg  10 mg Oral Q8H PRN Connye Burkitt, NP   10 mg at 01/14/20 1133   And  . ziprasidone (GEODON) injection 20 mg  20 mg Intramuscular PRN Connye Burkitt, NP      . risperiDONE (RISPERDAL) tablet 3 mg  3 mg Oral BID Connye Burkitt, NP   3 mg at 01/16/20 0943  . temazepam (RESTORIL) capsule 15 mg  15 mg Oral QHS PRN Cobos, Myer Peer, MD   15 mg at 01/15/20 2130   PTA Medications: Medications Prior to Admission  Medication Sig Dispense Refill Last Dose  . acetaminophen (TYLENOL) 325 MG tablet Take 650 mg by mouth  every 6 (six) hours as needed for mild pain or headache.     . benztropine (COGENTIN) 0.5 MG tablet Take 1 tablet (0.5 mg total) by mouth 2 (two) times daily. (Patient not taking: Reported on 01/15/2020) 60 tablet 2 Not Taking at Unknown time  . gabapentin (NEURONTIN) 300 MG capsule TAKE 1 CAPSULE(300 MG) BY MOUTH THREE TIMES DAILY 90 capsule 4   . metoCLOPramide (REGLAN) 10 MG tablet Take 1 tablet (10 mg total) by mouth as needed for nausea. Take 1 tablet as needed for nausea and vomitting (Patient not taking: Reported on 12/27/2019) 30 tablet 0 Not Taking at Unknown time  . paliperidone (INVEGA SUSTENNA) 156 MG/ML SUSY injection Inject 1 mL (156 mg total) into the muscle once for 1 dose. (Due on 01-05-20): For mood control 1 mL 0   . [START ON 02/02/2020] Paliperidone ER (INVEGA SUSTENNA) injection Inject 117 mg into the muscle every 28 (twenty-eight) days. (Due on 02-02-20): For mood control 0.9 mL 0   . risperiDONE (RISPERDAL) 3 MG tablet Take 2 tablets (6 mg total) by mouth at bedtime. (Patient not taking: Reported on 01/15/2020) 60 tablet 1 Not  Taking at Unknown time  . temazepam (RESTORIL) 30 MG capsule Take 1 capsule (30 mg total) by mouth at bedtime. (Patient not taking: Reported on 01/15/2020) 30 capsule 1 Not Taking at Unknown time    Patient Stressors:    Patient Strengths:    Treatment Modalities: Medication Management, Group therapy, Case management,  1 to 1 session with clinician, Psychoeducation, Recreational therapy.   Physician Treatment Plan for Primary Diagnosis: <principal problem not specified> Long Term Goal(s): Improvement in symptoms so as ready for discharge Improvement in symptoms so as ready for discharge   Short Term Goals: Ability to identify changes in lifestyle to reduce recurrence of condition will improve Ability to verbalize feelings will improve Ability to disclose and discuss suicidal ideas Ability to demonstrate self-control will improve Ability to identify  and develop effective coping behaviors will improve  Medication Management: Evaluate patient's response, side effects, and tolerance of medication regimen.  Therapeutic Interventions: 1 to 1 sessions, Unit Group sessions and Medication administration.  Evaluation of Outcomes: Progressing  Physician Treatment Plan for Secondary Diagnosis: Active Problems:   Schizophrenia ([place])  Long Term Goal(s): Improvement in symptoms so as ready for discharge Improvement in symptoms so as ready for discharge   Short Term Goals: Ability to identify changes in lifestyle to reduce recurrence of condition will improve Ability to verbalize feelings will improve Ability to disclose and discuss suicidal ideas Ability to demonstrate self-control will improve Ability to identify and develop effective coping behaviors will improve     Medication Management: Evaluate patient's response, side effects, and tolerance of medication regimen.  Therapeutic Interventions: 1 to 1 sessions, Unit Group sessions and Medication administration.  Evaluation of Outcomes: Progressing   RN Treatment Plan for Primary Diagnosis: <principal problem not specified> Long Term Goal(s): Knowledge of disease and therapeutic regimen to maintain health will improve  Short Term Goals: Ability to identify and develop effective coping behaviors will improve and Compliance with prescribed medications will improve  Medication Management: RN will administer medications as ordered by provider, will assess and evaluate patient's response and provide education to patient for prescribed medication. RN will report any adverse and/or side effects to prescribing provider.  Therapeutic Interventions: 1 on 1 counseling sessions, Psychoeducation, Medication administration, Evaluate responses to treatment, Monitor vital signs and CBGs as ordered, Perform/monitor CIWA, COWS, AIMS and Fall Risk screenings as ordered, Perform wound care treatments as  ordered.  Evaluation of Outcomes: Progressing   LCSW Treatment Plan for Primary Diagnosis: <principal problem not specified> Long Term Goal(s): Safe transition to appropriate next level of care at discharge, Engage patient in therapeutic group addressing interpersonal concerns.  Short Term Goals: Engage patient in aftercare planning with referrals and resources, Increase social support, Facilitate acceptance of mental health diagnosis and concerns, Facilitate patient progression through stages of change regarding substance use diagnoses and concerns and Identify triggers associated with mental health/substance abuse issues  Therapeutic Interventions: Assess for all discharge needs, 1 to 1 time with Social worker, Explore available resources and support systems, Assess for adequacy in community support network, Educate family and significant other(s) on suicide prevention, Complete Psychosocial Assessment, Interpersonal group therapy.  Evaluation of Outcomes: Progressing  Progress in Treatment: Attending groups: No. Believes that other patient's are stalking her. Participating in groups: No. Taking medication as prescribed: Yes. Toleration medication: Yes. Family/Significant other contact made: No, will contact:  supports if consents are granted. Patient understands diagnosis: No. Discussing patient identified problems/goals with staff: Yes. Medical problems stabilized or resolved: Yes. Denies  suicidal/homicidal ideation: Yes. Issues/concerns per patient self-inventory: Yes.  New problem(s) identified: Yes, Describe:  selective homelessness, hx of medication non-compliance, no social or family supports  New Short Term/Long Term Goal(s): medication management for mood stabilization; elimination of SI thoughts; development of comprehensive mental wellness/sobriety plan.  Patient Goals: "Talk with a Microbiologist. I need counseling."  Discharge Plan or Barriers: Plans to  continue living in her car, CSW assessing for appropriate referrals.  Reason for Continuation of Hospitalization: Delusions  Medication stabilization  Estimated Length of Stay: 1-3 days  Attendees: Patient: [name]  01/16/2020 10:50 AM  Physician: Dr.Farah 01/16/2020 10:50 AM  Nursing: Legrand Como, RN 01/16/2020 10:50 AM  RN Care Manager: 01/16/2020 10:50 AM  Social Worker: Stephanie Acre, [place] 01/16/2020 10:50 AM  Recreational Therapist:  01/16/2020 10:50 AM  Other:  01/16/2020 10:50 AM  Other:  01/16/2020 10:50 AM  Other: 01/16/2020 10:50 AM    Scribe for Treatment Team: Joellen Jersey, LCSWA 01/16/2020 10:50 AM

## 2020-01-16 NOTE — Progress Notes (Signed)
Pt in room & hallway screaming unprovoked on & off through day. Able to be verbally redirected until present time. Pt was informed of IM Ativan order during last outburst & pt stated she was able to remain calm without medication. Pt agreed that if she had another outburst she would take the IM medication so when screaming began at [place] staff informed her that medication would be administered. Pt compliant with injection at this time, no issues. Remains in room. Monitoring for safety continues.  Johnnye Lana, RN 01/16/2020 1:52 PM

## 2020-01-16 NOTE — Progress Notes (Signed)
Recreation Therapy Notes  3.22.21  1050:  LRT attempted to complete recreation therapy assessment with patient.  Pt declined.  LRT will attempt to complete assessment at a later time.   [name], LRT/CTRS        [name] A 01/16/2020 12:35 PM

## 2020-01-16 NOTE — [facility] Group Notes (Signed)
LCSW Aftercare Discharge Planning Group Note  01/16/2020   Type of Group and Topic: Psychoeducational Group: Discharge Planning  Participation Level: Did Not Attend  Description of Group  Discharge planning group reviews patient's anticipated discharge plans and assists patients to anticipate and address any barriers to wellness/recovery in the community. Suicide prevention education is reviewed with patients in group.  Therapeutic Goals  1. Patients will state their anticipated discharge plan and mental health aftercare  2. Patients will identify potential barriers to wellness in the community setting  3. Patients will engage in problem solving, solution focused discussion of ways to anticipate and address barriers to wellness/recovery  Summary of Patient Progress  Plan for Discharge/Comments:  Transportation Means:  Supports:  Therapeutic Modalities:  [place], [place]  01/16/2020 2:04 PM

## 2020-01-16 NOTE — Progress Notes (Signed)
[facility] MD Progress Note  01/16/2020 9:22 AM [name]  MRN:  [id number] Subjective:    Patient continues to express delusional beliefs and even believes 2 of the female patients on the ward are in fact previous talkers of hers and has been folded into her delusional system therefore she is avoiding groups.  We may in fact move her to another ward if this becomes more problematic however some of these individuals may be discharged today Reality based therapy of limited benefit and currently is still treatment resisting delusions Principal Problem: Treatment resistant psychosis Diagnosis: Active Problems:   Schizophrenia ([place])  Total Time spent with patient: 20 minutes  Past Psychiatric History: treatment resistant   Past Medical History:  Past Medical History:  Diagnosis Date  . Anxiety   . Delusional disorder ([place])   . Diabetes mellitus without complication ([place])   . Homelessness   . Hypertension   . Major depressive disorder, recurrent, severe with psychotic features ([place])   . Overdose   . Paranoid schizophrenia ([place])   . Schizophrenia ([place])   . Tremors of nervous system    History reviewed. No pertinent surgical history. Family History:  Family History  Problem Relation Age of Onset  . Hypertension Mother   . Diabetes Mother   . Aneurysm Mother   . Hypertension Father   . Diabetes Father   . Hypertension Sister   . Diabetes Sister   . Hypertension Other   . Diabetes Other    Family Psychiatric  History: see eval Social History:  Social History   Substance and Sexual Activity  Alcohol Use No     Social History   Substance and Sexual Activity  Drug Use No    Social History   Socioeconomic History  . Marital status: Single    Spouse name: Not on file  . Number of children: Not on file  . Years of education: Not on file  . Highest education level: Not on file  Occupational History  . Not on file  Tobacco Use  . Smoking status: Never Smoker  . Smokeless  tobacco: Never Used  Substance and Sexual Activity  . Alcohol use: No  . Drug use: No  . Sexual activity: Never  Other Topics Concern  . Not on file  Social History Narrative   Lives alone in an apartment. Has steps up to apartment      Right handed      Highest level of edu- Some college      Unemployed   Social Determinants of Health   Financial Resource Strain:   . Difficulty of Paying Living Expenses:   Food Insecurity:   . Worried About Charity fundraiser in the Last Year:   . Arboriculturist in the Last Year:   Transportation Needs:   . Film/video editor (Medical):   Marland Kitchen Lack of Transportation (Non-Medical):   Physical Activity:   . Days of Exercise per Week:   . Minutes of Exercise per Session:   Stress:   . Feeling of Stress :   Social Connections:   . Frequency of Communication with Friends and Family:   . Frequency of Social Gatherings with Friends and Family:   . Attends Religious Services:   . Active Member of Clubs or Organizations:   . Attends Archivist Meetings:   Marland Kitchen Marital Status:    Additional Social History:  Sleep: Fair  Appetite:  Good  Current Medications: Current Facility-Administered Medications  Medication Dose Route Frequency Provider Last Rate Last Admin  . acetaminophen (TYLENOL) tablet 650 mg  650 mg Oral Q6H PRN Lindon Romp A, NP      . alum & mag hydroxide-simeth (MAALOX/MYLANTA) 200-200-20 MG/5ML suspension 30 mL  30 mL Oral Q4H PRN Lindon Romp A, NP      . benztropine (COGENTIN) tablet 0.5 mg  0.5 mg Oral BID Lindon Romp A, NP   0.5 mg at 01/15/20 1714  . carvedilol (COREG) tablet 25 mg  25 mg Oral BID WC Johnn Hai, MD      . gabapentin (NEURONTIN) capsule 300 mg  300 mg Oral TID Connye Burkitt, NP   300 mg at 01/15/20 1714  . hydrOXYzine (ATARAX/VISTARIL) tablet 25 mg  25 mg Oral TID PRN Rozetta Nunnery, NP   25 mg at 01/15/20 2130  . LORazepam (ATIVAN) tablet 1 mg  1 mg Oral  Q6H PRN Connye Burkitt, NP   1 mg at 01/14/20 1133   Or  . LORazepam (ATIVAN) injection 1 mg  1 mg Intramuscular Q6H PRN Connye Burkitt, NP      . magnesium hydroxide (MILK OF MAGNESIA) suspension 30 mL  30 mL Oral Daily PRN Lindon Romp A, NP      . OLANZapine zydis (ZYPREXA) disintegrating tablet 10 mg  10 mg Oral Q8H PRN Connye Burkitt, NP   10 mg at 01/14/20 1133   And  . ziprasidone (GEODON) injection 20 mg  20 mg Intramuscular PRN Connye Burkitt, NP      . risperiDONE (RISPERDAL) tablet 3 mg  3 mg Oral BID Connye Burkitt, NP   3 mg at 01/15/20 1714  . temazepam (RESTORIL) capsule 15 mg  15 mg Oral QHS PRN Cobos, Myer Peer, MD   15 mg at 01/15/20 2130    Lab Results:  Results for orders placed or performed during the hospital encounter of 01/14/20 (from the past 48 hour(s))  Glucose, capillary     Status: Abnormal   Collection Time: 01/14/20  4:14 PM  Result Value Ref Range   Glucose-Capillary 125 (H) 70 - 99 mg/dL    Comment: Glucose reference range applies only to samples taken after fasting for at least 8 hours.    Blood Alcohol level:  Lab Results  Component Value Date   ETH <10 01/12/2020   ETH <10 0000000    Metabolic Disorder Labs: Lab Results  Component Value Date   HGBA1C 6.3 (H) 12/27/2019   MPG 134.11 12/27/2019   MPG 142.72 06/05/2019   Lab Results  Component Value Date   PROLACTIN 60.4 (H) 12/27/2019   Lab Results  Component Value Date   CHOL 226 (H) 12/27/2019   TRIG 88 12/27/2019   HDL 49 12/27/2019   CHOLHDL 4.6 12/27/2019   VLDL 18 12/27/2019   LDLCALC 159 (H) 12/27/2019   LDLCALC 139 (H) 06/05/2019    Physical Findings: AIMS: Facial and Oral Movements Muscles of Facial Expression: None, normal Lips and Perioral Area: None, normal Jaw: None, normal Tongue: None, normal,Extremity Movements Upper (arms, wrists, hands, fingers): None, normal Lower (legs, knees, ankles, toes): None, normal, Trunk Movements Neck, shoulders, hips: None,  normal, Overall Severity Severity of abnormal movements (highest score from questions above): None, normal Incapacitation due to abnormal movements: None, normal Patient's awareness of abnormal movements (rate only patient's report): No Awareness, Dental Status Current problems with teeth  and/or dentures?: No Does patient usually wear dentures?: No  CIWA:    COWS:     Musculoskeletal: Strength & Muscle Tone: within normal limits Gait & Station: normal Patient leans: N/A  Psychiatric Specialty Exam: Physical Exam  Review of Systems  Blood pressure (!) 135/103, pulse (!) 103, temperature (!) 97.5 F (36.4 C), temperature source Oral, resp. rate 20, height 5' 5.51" (1.664 m), weight 121.6 kg, last menstrual period 10/29/2011, SpO2 100 %.Body mass index is 43.9 kg/m.  General Appearance: Casual and Disheveled  Eye Contact:  Good  Speech:  Clear and Coherent  Volume:  Increased  Mood:  Irritable and hypomanic  Affect:  Congruent  Thought Process:  Irrelevant and Descriptions of Associations: Circumstantial  Orientation:  Full (Time, Place, and Person)  Thought Content:  Illogical, Delusions, Paranoid Ideation and Rumination  Suicidal Thoughts:  No  Homicidal Thoughts:  No  Memory:  Immediate;   Fair Recent;   Fair  Judgement:  Fair  Insight:  Fair  Psychomotor Activity:  Normal  Concentration:  Concentration: Fair and Attention Span: Fair  Recall:  [employer] of Knowledge:  Fair  Language:  Fair  Akathisia:  Negative  Handed:  Right  AIMS (if indicated):     Assets:  Physical Health Resilience  ADL's:  Intact  Cognition:  WNL  Sleep:  Number of Hours: 6.75     Treatment Plan Summary: Daily contact with patient to assess and evaluate symptoms and progress in treatment and Medication management Continue antipsychotic medication and reality based therapy we have discussed clozapine for now she resists this- clings to her delusional beliefs   Bobbiejo Ishikawa, MD 01/16/2020,  9:22 AM

## 2020-01-16 NOTE — Progress Notes (Signed)
Patient declined to meet with CSW team earlier this afternoon. Patient received IM medications at approximately 1:45pm, unable to participate in assessment at this time.   CSW will follow up and make additional attempts to complete PSA.  Stephanie Acre, MSW, [place] Social Worker [facility]  [phone]
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
## 2020-01-17 NOTE — BHH Suicide Risk Assessment (Signed)
[facility] INPATIENT:  Family/Significant Other Suicide Prevention Education  Suicide Prevention Education:  Patient Refusal for Family/Significant Other Suicide Prevention Education: The patient [name] has refused to provide written consent for family/significant other to be provided Family/Significant Other Suicide Prevention Education during admission and/or prior to discharge.  Physician notified.  Joellen Jersey 01/17/2020, 10:55 AM

## 2020-01-17 NOTE — Progress Notes (Signed)
[facility] MD Progress Note  01/17/2020 1:03 PM [name]  MRN:  [id number] Subjective:    Patient seen in her room she requests that the staff "talk to the [place]" but I explained to her that we simply believe that her concerns are fully legitimate, and though she believes them as reality, we simply cannot advocate for her against these individuals he believes are harassing her-but she does not respond well to reality based therapy, clings to her delusional beliefs.  But she is informed that she will be discharged tomorrow.  She did not scream or behaviorally lose control with this information  Principal Problem: <principal problem not specified> Diagnosis: Active Problems:   Schizophrenia ([place])  Total Time spent with patient: 20 minutes  Past Psychiatric History: Multiple similar presentations leading to chronic homelessness propelled by paranoia  Past Medical History:  Past Medical History:  Diagnosis Date  . Anxiety   . Delusional disorder ([place])   . Diabetes mellitus without complication ([place])   . Homelessness   . Hypertension   . Major depressive disorder, recurrent, severe with psychotic features ([place])   . Overdose   . Paranoid schizophrenia ([place])   . Schizophrenia ([place])   . Tremors of nervous system    History reviewed. No pertinent surgical history. Family History:  Family History  Problem Relation Age of Onset  . Hypertension Mother   . Diabetes Mother   . Aneurysm Mother   . Hypertension Father   . Diabetes Father   . Hypertension Sister   . Diabetes Sister   . Hypertension Other   . Diabetes Other    Family Psychiatric  History: No new data shared Social History:  Social History   Substance and Sexual Activity  Alcohol Use No     Social History   Substance and Sexual Activity  Drug Use No    Social History   Socioeconomic History  . Marital status: Single    Spouse name: Not on file  . Number of children: Not on file  . Years of  education: Not on file  . Highest education level: Not on file  Occupational History  . Not on file  Tobacco Use  . Smoking status: Never Smoker  . Smokeless tobacco: Never Used  Substance and Sexual Activity  . Alcohol use: No  . Drug use: No  . Sexual activity: Never  Other Topics Concern  . Not on file  Social History Narrative   Lives alone in an apartment. Has steps up to apartment      Right handed      Highest level of edu- Some college      Unemployed   Social Determinants of Health   Financial Resource Strain:   . Difficulty of Paying Living Expenses:   Food Insecurity:   . Worried About Charity fundraiser in the Last Year:   . Arboriculturist in the Last Year:   Transportation Needs:   . Film/video editor (Medical):   Marland Kitchen Lack of Transportation (Non-Medical):   Physical Activity:   . Days of Exercise per Week:   . Minutes of Exercise per Session:   Stress:   . Feeling of Stress :   Social Connections:   . Frequency of Communication with Friends and Family:   . Frequency of Social Gatherings with Friends and Family:   . Attends Religious Services:   . Active Member of Clubs or Organizations:   . Attends Archivist Meetings:   .  Marital Status:    Additional Social History:                         Sleep: Fair  Appetite:  Fair  Current Medications: Current Facility-Administered Medications  Medication Dose Route Frequency Provider Last Rate Last Admin  . acetaminophen (TYLENOL) tablet 650 mg  650 mg Oral Q6H PRN Lindon Romp A, NP      . alum & mag hydroxide-simeth (MAALOX/MYLANTA) 200-200-20 MG/5ML suspension 30 mL  30 mL Oral Q4H PRN Lindon Romp A, NP      . benztropine (COGENTIN) tablet 0.5 mg  0.5 mg Oral BID Lindon Romp A, NP   0.5 mg at 01/17/20 1051  . carvedilol (COREG) tablet 25 mg  25 mg Oral BID WC Johnn Hai, MD   25 mg at 01/17/20 1052  . gabapentin (NEURONTIN) capsule 300 mg  300 mg Oral TID Connye Burkitt, NP    300 mg at 01/17/20 1051  . hydrOXYzine (ATARAX/VISTARIL) tablet 25 mg  25 mg Oral TID PRN Lindon Romp A, NP   25 mg at 01/15/20 2130  . LORazepam (ATIVAN) tablet 1 mg  1 mg Oral Q6H PRN Connye Burkitt, NP   1 mg at 01/14/20 1133   Or  . LORazepam (ATIVAN) injection 1 mg  1 mg Intramuscular Q6H PRN Connye Burkitt, NP      . magnesium hydroxide (MILK OF MAGNESIA) suspension 30 mL  30 mL Oral Daily PRN Lindon Romp A, NP      . OLANZapine zydis (ZYPREXA) disintegrating tablet 10 mg  10 mg Oral Q8H PRN Connye Burkitt, NP   10 mg at 01/14/20 1133   And  . ziprasidone (GEODON) injection 20 mg  20 mg Intramuscular PRN Connye Burkitt, NP      . risperiDONE (RISPERDAL) tablet 3 mg  3 mg Oral BID Connye Burkitt, NP   3 mg at 01/17/20 1051  . temazepam (RESTORIL) capsule 15 mg  15 mg Oral QHS PRN Cobos, Myer Peer, MD   15 mg at 01/15/20 2130    Lab Results: No results found for this or any previous visit (from the past 48 hour(s)).  Blood Alcohol level:  Lab Results  Component Value Date   ETH <10 01/12/2020   ETH <10 0000000    Metabolic Disorder Labs: Lab Results  Component Value Date   HGBA1C 6.3 (H) 12/27/2019   MPG 134.11 12/27/2019   MPG 142.72 06/05/2019   Lab Results  Component Value Date   PROLACTIN 60.4 (H) 12/27/2019   Lab Results  Component Value Date   CHOL 226 (H) 12/27/2019   TRIG 88 12/27/2019   HDL 49 12/27/2019   CHOLHDL 4.6 12/27/2019   VLDL 18 12/27/2019   LDLCALC 159 (H) 12/27/2019   LDLCALC 139 (H) 06/05/2019    Physical Findings: AIMS: Facial and Oral Movements Muscles of Facial Expression: None, normal Lips and Perioral Area: None, normal Jaw: None, normal Tongue: None, normal,Extremity Movements Upper (arms, wrists, hands, fingers): None, normal Lower (legs, knees, ankles, toes): None, normal, Trunk Movements Neck, shoulders, hips: None, normal, Overall Severity Severity of abnormal movements (highest score from questions above): None,  normal Incapacitation due to abnormal movements: None, normal Patient's awareness of abnormal movements (rate only patient's report): No Awareness, Dental Status Current problems with teeth and/or dentures?: No Does patient usually wear dentures?: No  CIWA:    COWS:     Musculoskeletal: Strength &  Muscle Tone: within normal limits Gait & Station: normal Patient leans: N/A  Psychiatric Specialty Exam: Physical Exam  Review of Systems  Blood pressure 120/88, pulse (!) 115, temperature (!) 97.5 F (36.4 C), temperature source Oral, resp. rate 20, height 5' 5.51" (1.664 m), weight 121.6 kg, last menstrual period 10/29/2011, SpO2 100 %.Body mass index is 43.9 kg/m.  General Appearance: Casual  Eye Contact:  Fair  Speech:  Normal Rate  Volume:  Decreased  Mood:  Euthymic  Affect:  Restricted  Thought Process:  Irrelevant  Orientation:  Full (Time, Place, and Person)  Thought Content:  Paranoid Ideation and Rumination  Suicidal Thoughts:  No  Homicidal Thoughts:  No  Memory:  Immediate;   Fair Recent;   Fair Remote;   Fair  Judgement:  Fair  Insight: Fails to accept she has a mental illness  Psychomotor Activity:  Normal  Concentration:  Concentration: Fair and Attention Span: Fair  Recall:  Poor  Fund of Knowledge:  Fair  Language:  Fair  Akathisia:  Negative  Handed:  Right  AIMS (if indicated):     Assets:  Resilience Social Support  ADL's:  Intact  Cognition:  WNL  Sleep:  Number of Hours: 8.5     Treatment Plan Summary: Daily contact with patient to assess and evaluate symptoms and progress in treatment and Medication management  No change in precautions or medications prepared for discharge tomorrow  Johnn Hai, MD 01/17/2020, 1:03 PM

## 2020-01-17 NOTE — Progress Notes (Signed)
Recreation Therapy Notes  Patient admitted to unit 3.20.21. Due to admission within last year, no new assessment conducted at this time. Last assessment conducted 3.2.21. Patient reports no changes in stressors from previous admission.  Patient reports reason for current admission "trying to get an apartment and it appears to have gotten stopped all together".  Patient denies SI, HI, AVH at this time. Patient reports goal of "talk to Education officer, museum and get them on point with [employer]".  Information found below from assessment conducted 3.2.21   Stressors:  Going through harrassment case; No place to stay  Coping Skills: Write, Music, Prayer, Avoidance  Patient Strengths: Communication; Singing  Areas of Improvement: Reaching out to people more; Keep trying to get police more involved in harrassment case    Keltie Labell [name], LRT/CTRS   [name] A 01/17/2020 12:02 PM

## 2020-01-17 NOTE — BHH Counselor (Signed)
CSW facilitated [facility] APS interview with Social Worker Rolm Bookbinder [phone]) to determine if patient is appropriate to be appointed a legal guardian.  At this time, there is NOT a recommendation for patient to be appointed a guardian.  Stephanie Acre, MSW, [place] Social Worker [facility]  [phone]

## 2020-01-17 NOTE — Progress Notes (Signed)
Recreation Therapy Notes  Date: 3.23.21 Time: 0950 Location: 500 Hall Dayroom  Group Topic: Coping Skills  Goal Area(s) Addresses:  Patient will identify difference between healthy and unhealthy coping strategies. Patient will identify benefit of using healthy coping strategies.  Intervention:  Worksheet  Activity: Healthy vs. Unhealthy Coping Strategies.  Patients were to identify a problem they are currently facing.  Patients then identify unhealthy coping strategies and consequences of unhealthy coping strategies.  Lastly, patients identified healthy coping strategies, expected outcomes and barriers to these coping strategies.  Education: Radiographer, therapeutic, Dentist.   Education Outcome: Acknowledges understanding/In group clarification offered/Needs additional education.   Clinical Observations/Feedback: Pt did not attend group session.   [name], LRT/CTRS    [name] A 01/17/2020 11:39 AM

## 2020-01-17 NOTE — Plan of Care (Signed)
Progress note  D: pt found in bed; compliant with medication administration. Pt continues to try to test boundaries. Pt also continues to scream out to gain attention of staff. Pt continues to be engulfed in their delusions, trying to engage other people that are passing by on the unit I.e. police officers. Pt shows little insight into their illness and how this is impacting their life. Pt denies si/hi/ah/vh and verbally agrees to approach staff if these become apparent or before harming themself/others while at [facility].  A: Pt provided support and encouragement. Pt given medication per protocol and standing orders. Q67m safety checks implemented and continued.  R: Pt safe on the unit. Will continue to monitor.  Pt progressing in the following metrics  Problem: Education: Goal: Knowledge of [facility] Education information/materials will improve Outcome: Not Progressing Goal: Emotional status will improve Outcome: Not Progressing Goal: Mental status will improve Outcome: Not Progressing Goal: Verbalization of understanding the information provided will improve Outcome: Not Progressing

## 2020-01-17 NOTE — Progress Notes (Signed)
   01/17/20 0610  Psych Admission Type (Psych Patients Only)  Admission Status Involuntary  Psychosocial Assessment  Patient Complaints None  Eye Contact Brief  Facial Expression Anxious;Pensive;Worried  Affect Anxious;Preoccupied  Education administrator Activity Slow  Appearance/Hygiene In hospital gown  Behavior Characteristics Anxious  Mood Depressed;Anxious  Thought Process  Coherency Unable to assess (pt just waking up from sleep)  Content WDL  Delusions UTA (pt just awake from sleep)  Perception WDL  Hallucination None reported or observed  Judgment Poor  Confusion None  Danger to Self  Current suicidal ideation? Denies  Danger to Others  Danger to Others None reported or observed   Pt in bed all of shift. Just waking up. Pt minimal. When asked how she was feeling, pt stated, "A bit well. I prayed about it." Pt offered support and informed that it was time for vital sign assessment before breakfast.

## 2020-01-17 NOTE — Accreditation Note (Signed)
Adult Psychoeducational Group Note  Date:  01/17/2020 Time:  8:02 PM  Group Topic/Focus:  Wrap-Up Group:   The focus of this group is to help patients review their daily goal of treatment and discuss progress on daily workbooks.  Participation Level:  Active  Participation Quality:  Appropriate  Affect:  Appropriate  Cognitive:  Appropriate  Insight: Appropriate  Engagement in Group:  Engaged  Modes of Intervention:  Discussion  Additional Comments: Patient attended group and said that her day was a 1. Her goal for today was to talk to her Education officer, museum, but that goal wasn't accomplished.   Alizah Sills W Sheana Bir [id number], 8:02 PM

## 2020-01-17 NOTE — BHH Counselor (Addendum)
At patient's request, CSW contacted "Ubaldo Glassing," admissions coordinator at [facility] in [place], [place] [phone]). Ubaldo Glassing confirms receipt of patient's Counsellor and shares that patient's background check has returned and cleared, Ubaldo Glassing reports she is waiting for [employer] to release patient's financial statements for the last 6 months. After this, patient's application can move forward to their corporate office for final processing.  CSW thanked [place] for updates.  Information was relayed to patient, who explained that she believes Ubaldo Glassing is intentionally delaying her application "it's a light skinned African American, dark skinned African American conflict," and explained how this relates to her issues with a particular church.  Patient is requesting to postpone discharge until she has secure housing, CSW explained this was not likely to be accommodated as patient is expected to discharge tomorrow. Patient initially declined outpatient mental health follow up, and later agreed to CSW referring patient to [facility] in [facility].  Stephanie Acre, MSW, [place] Social Worker [facility]  [phone]

## 2020-01-17 NOTE — Progress Notes (Signed)
   01/17/20 2233  Psych Admission Type (Psych Patients Only)  Admission Status Involuntary  Psychosocial Assessment  Patient Complaints Anxiety  Eye Contact Brief  Facial Expression Anxious;Pensive;Worried  Affect Anxious;Preoccupied  Systems developer Activity Slow  Appearance/Hygiene Unremarkable;In hospital gown  Behavior Characteristics Cooperative;Anxious  Mood Anxious;Depressed;Preoccupied  Thought Process  Coherency WDL  Content WDL  Delusions None reported or observed  Perception WDL  Hallucination None reported or observed  Judgment Limited  Confusion None  Danger to Self  Current suicidal ideation? Denies  Danger to Others  Danger to Others None reported or observed

## 2020-01-18 NOTE — Plan of Care (Signed)
Pt discharged today at 23; left with "Safe Transport" escorted to the vehicle by [facility]. Pt upon discharge is alert and oriented to person, place and time. Pt is clam, cooperative, denies suicidal and homicidal ideation, denies hallucinations, denies feelings of depression and anxiety. Pt given discharge instructions including follow up plan, discharge medication education; pt verbalized understanding of plan. No distress noted, none reported, pt voiced no complaints. Problem: Education: Goal: Ability to state activities that reduce stress will improve Outcome: Adequate for Discharge   Problem: Coping: Goal: Ability to identify and develop effective coping behavior will improve Outcome: Adequate for Discharge   Problem: Self-Concept: Goal: Ability to identify factors that promote anxiety will improve Outcome: Adequate for Discharge Goal: Level of anxiety will decrease Outcome: Adequate for Discharge Goal: Ability to modify response to factors that promote anxiety will improve Outcome: Adequate for Discharge   Problem: Education: Goal: Knowledge of [place] [facility] Education information/materials will improve Outcome: Adequate for Discharge Goal: Emotional status will improve Outcome: Adequate for Discharge Goal: Mental status will improve Outcome: Adequate for Discharge Goal: Verbalization of understanding the information provided will improve Outcome: Adequate for Discharge   Problem: Activity: Goal: Interest or engagement in activities will improve Outcome: Adequate for Discharge Goal: Sleeping patterns will improve Outcome: Adequate for Discharge   Problem: Coping: Goal: Ability to verbalize frustrations and anger appropriately will improve Outcome: Adequate for Discharge Goal: Ability to demonstrate self-control will improve Outcome: Adequate for Discharge   Problem: Health Behavior/Discharge Planning: Goal: Identification of resources available to assist in meeting  health care needs will improve Outcome: Adequate for Discharge Goal: Compliance with treatment plan for underlying cause of condition will improve Outcome: Adequate for Discharge   Problem: Physical Regulation: Goal: Ability to maintain clinical measurements within normal limits will improve Outcome: Adequate for Discharge   Problem: Safety: Goal: Periods of time without injury will increase Outcome: Adequate for Discharge

## 2020-01-18 NOTE — Progress Notes (Signed)
  [facility] Adult Case Management Discharge Plan :  Will you be returning to the same living situation after discharge:  Yes,  returning to her car. Declines shelter resources. At discharge, do you have transportation home?: Yes,  Safe Transportation back to car Do you have the ability to pay for your medications: Yes,  Medicare.  Release of information consent forms completed and in the chart;  Patient's signature needed at discharge.  Patient to Follow up at: Follow-up Information    [place]. Go on 01/23/2020.   Specialty: Behavioral Health Why: You are scheduled for an appointment on 01/23/20 at 2:00 pm in the [facility].  Please bring your insurance card, ID, and discharge summary.  Please wear a mask and come alone or you may bring one person to help you if necessary. Contact information: [place]. [place]. Go on 01/20/2020.   Why: You are scheduled for a clinical assessment appointment on 01/20/20 at 1:00 pm.  This appointment will be held in person and will be approximately 2 hours. Please bring your insurance card, ID and your discharge summary with you.   Contact information: [address] [place] [address] [phone]           Next level of care provider has access to [place] and Suicide Prevention discussed: Yes,  with patient. Patient declined consents.  Has patient been referred to the Quitline?: N/A patient is not a smoker  Patient has been referred for addiction treatment: Yes  Joellen Jersey, [place] 01/18/2020, 9:24 AM

## 2020-01-18 NOTE — Progress Notes (Signed)
Recreation Therapy Notes  INPATIENT RECREATION TR PLAN  Patient Details Name: [name] MRN: [id number] DOB: [birth year] Today's Date: 01/18/2020  Rec Therapy Plan Is patient appropriate for Therapeutic Recreation?: Yes Treatment times per week: about 3 days Estimated Length of Stay: 5-7 days TR Treatment/Interventions: Group participation (Comment)  Discharge Criteria Pt will be discharged from therapy if:: Discharged Treatment plan/goals/alternatives discussed and agreed upon by:: Patient/family  Discharge Summary Short term goals set: See patient care plan Short term goals met: Not met Reason goals not met: Pt did not attend groups Therapeutic equipment acquired: N/A Reason patient discharged from therapy: Discharge from hospital Pt/family agrees with progress & goals achieved: Yes Date patient discharged from therapy: 01/18/20    [name], LRT/CTRS  [name], [place] Hidrogo A 01/18/2020, 11:35 AM

## 2020-01-18 NOTE — Discharge Summary (Signed)
Physician Discharge Summary Note  Patient:  [name] is an 54 y.o., female  MRN:  [id number]  DOB:  1966/01/23  Patient phone:  [phone] (home)   Patient address:   [place] [address],   Total Time spent with patient: Greater than 30 minutes  Date of Admission:  01/14/2020  Date of Discharge: 01/18/20  Reason for Admission: Worsening paranoia.  Principal Problem: Paranoid schizophrenia [facility])  Discharge Diagnoses: Principal Problem:   Paranoid schizophrenia ([place]) Active Problems:   Delusional disorder ([place])   Schizophrenia ([place])  Past Psychiatric History: Schizophrenia                                             Several psychiatric admissions/discharges here at [facility] with similar presentations each time.   Past Medical History:  Past Medical History:  Diagnosis Date  . Anxiety   . Delusional disorder ([place])   . Diabetes mellitus without complication ([place])   . Homelessness   . Hypertension   . Major depressive disorder, recurrent, severe with psychotic features ([place])   . Overdose   . Paranoid schizophrenia ([place])   . Schizophrenia ([place])   . Tremors of nervous system    History reviewed. No pertinent surgical history.  Family History:  Family History  Problem Relation Age of Onset  . Hypertension Mother   . Diabetes Mother   . Aneurysm Mother   . Hypertension Father   . Diabetes Father   . Hypertension Sister   . Diabetes Sister   . Hypertension Other   . Diabetes Other    Family Psychiatric  History: See H&P  Social History:  Social History   Substance and Sexual Activity  Alcohol Use No     Social History   Substance and Sexual Activity  Drug Use No    Social History   Socioeconomic History  . Marital status: Single    Spouse name: Not on file  . Number of children: Not on file  . Years of education: Not on file  . Highest education level: Not on file  Occupational History  . Not on file  Tobacco Use  .  Smoking status: Never Smoker  . Smokeless tobacco: Never Used  Substance and Sexual Activity  . Alcohol use: No  . Drug use: No  . Sexual activity: Never  Other Topics Concern  . Not on file  Social History Narrative   Lives alone in an apartment. Has steps up to apartment      Right handed      Highest level of edu- Some college      Unemployed   Social Determinants of Health   Financial Resource Strain:   . Difficulty of Paying Living Expenses:   Food Insecurity:   . Worried About Charity fundraiser in the Last Year:   . Arboriculturist in the Last Year:   Transportation Needs:   . Film/video editor (Medical):   Marland Kitchen Lack of Transportation (Non-Medical):   Physical Activity:   . Days of Exercise per Week:   . Minutes of Exercise per Session:   Stress:   . Feeling of Stress :   Social Connections:   . Frequency of Communication with Friends and Family:   . Frequency of Social Gatherings with Friends and Family:   . Attends Religious Services:   .  Active Member of Clubs or Organizations:   . Attends Archivist Meetings:   Marland Kitchen Marital Status:    Hospital Course: (Per Md's admission evaluation): 54 year old female, presented to ED the on 01/12/20. Initially reported numbness on right side of her body but was not noted to be lateralized,symptoms were felt to be psychogenic and was medically cleared. She also reported thoughts that she is being "cyber stalked ", harassed,and reported thinking that she is being video/audio taped,feeling unsafe. As per chart notes earlier today patient presented overtly paranoid, verbally aggressive/accusatory towards staff/peers,requiring as needed medication. Currently patient presents comfortable in bed, drowsy,participates in session but falls asleep at times. Easily awoken by calling her name. She is oriented x 3. She presents with paranoid ideations. She reports"people put me in 2 way video", "it is audio, it is video  but it is also so much more""I was trying to send the information to the police but the wrong information was sent". Currently denies hallucinations.Patient has a prior history of psychiatric illness, has been diagnosed with schizophrenia. She was recently admitted to [facility] from 3/1-3/4/21for psychotic/paranoid symptoms. At that time was discharged on benztropine 0.5 mg twice daily, Neurontin 300 mg 3 times daily, Invega Sustenna 156 mg monthly with an initial IM injection scheduled for 3/11,risperidone 6 mg nightly. At this time, it is unclear whether patient received said IM injection. Currently denies alcohol or drug abuse. Admission BAL/UDS negative. Medical history-reports past history of hypertension,DM.Apparently patient has not been taking any antihypertensive or diabetic medications .3/2 hemoglobin A1c is 6.3. 318 nonfasting serum glucose is 120. BP today 154/92 with a pulse of 79.  Again this is one of several psychiatric discharge summaries for this 54 year old AA female with hx of chronic mental illness & multiple psychiatric admissions. She is known in this [facility] & other psychiatric hospitals within the surrounding areas for worsening symptoms of paranoia, delusions, homelessness & non-compliant to her treatment regimen. She has been tried on multiple psychotropic medications for her symptoms & it appears nothing has actually been helpful in stabilizing her symptoms because she does not always comply to taking them. However, her paranoid ideations, delusional thoughts of being cyber stalked, video/audio taped & other somatic complaints of medical symptoms has always been her complaints for seeking either medical or mental health treatments. Then, the issues with non-compliant to her recommended treatment regimen could be blamed on her problems with chronic homelessness as a potential barrier to her mental health stabilization. This patient also may be lacking support systems. She was brought  to the [facility] this time around for evaluation & treatment of worsening paranoia/delusional thinking.  After evaluation of her presenting symptoms this time around, [name] was recommended for mood stabilization treatments. The medication regimen for her presenting symptoms were discussed & with her consent initiated. She received, stabilized & was discharged on the medications as listed below on her discharge medication lists. She was also enrolled & participated sometimes in the group counseling sessions being offered & held on this unit. She learned coping skills. She presented on this admission, other significant chronic medical conditions that required treatment & monitoring. She was resumed/discharged on all her pertinent medications for those health issues . She tolerated her treatment regimen without any adverse effects or reactions reported.  And because of the chronic nature of her psychiatric symptoms, their resistance to the treatment regimen & non-compliance to treatment regimen, [name] was treated, stabilized & being discharged on two separate antipsychotic medications (Risperdal tablets &  Invega injectable). This is because she has not been able to achieve symptoms control under an antipsychotic monotherapy. These two combination antipsychotic therapies seem effective in stabilizing her symptoms at this time. It will benefit patient to continue on these combination antipsychotic therapies as recommended. However, as her symptoms continue to improve, she may then be titrated down to an antipsychotic monotherapy. This is to prevent the chances for the development of metabolic syndrome usually associated with use of multiple antipsychotic regimen. This has to be done within the proper monitoring, discretion & judgement of her outpatient psychiatric provider. [name]'s symptoms responded well to her current treatment regimen.  During the course of her hospitalization, the 15-minute checks were adequate  to ensure [name]'s safety.  Patient did not display any dangerous, violent or suicidal behavior on the unit. She interacted with patients & staff appropriately, participated appropriately in some group sessions/therapies. Her medications were addressed & adjusted to meet her needs. She was recommended for outpatient follow-up care & medication management upon discharge to assure her continuity of care.  At the time of discharge patient is not reporting any acute suicidal/homicidal ideations. She feels more confident about her self-care & in taking her medications. She currently denies any new issues or concerns. Education and supportive counseling provided throughout her hospital stay & upon discharge.  Today upon her discharge evaluation with the attending psychiatrist, [name] shares she is doing well. She denies any other specific concerns. She is sleeping well. Her appetite is good. She denies other physical complaints. She denies AH/VH. She feels that her medications have been helpful & is in agreement to continue her current treatment regimen as recommended. She was able to engage in safety planning including plan to return to [facility] or contact emergency services if she feels unable to maintain her own safety or the safety of others. Pt had no further questions, comments, or concerns. She left [facility] with all personal belongings in no apparent distress. Transportation per the [employer] safe [employer].  Physical Findings: AIMS: Facial and Oral Movements Muscles of Facial Expression: None, normal Lips and Perioral Area: None, normal Jaw: None, normal Tongue: None, normal,Extremity Movements Upper (arms, wrists, hands, fingers): None, normal Lower (legs, knees, ankles, toes): None, normal, Trunk Movements Neck, shoulders, hips: None, normal, Overall Severity Severity of abnormal movements (highest score from questions above): None, normal Incapacitation due to abnormal movements: None,  normal Patient's awareness of abnormal movements (rate only patient's report): No Awareness, Dental Status Current problems with teeth and/or dentures?: No Does patient usually wear dentures?: No  CIWA:    COWS:     Musculoskeletal: Strength & Muscle Tone: within normal limits Gait & Station: normal Patient leans: N/A  Psychiatric Specialty Exam: Physical Exam  Nursing note and vitals reviewed. Constitutional: She is oriented to person, place, and time. She appears well-developed and well-nourished.  Respiratory: Effort normal.  Genitourinary:    Genitourinary Comments: Deferred   Musculoskeletal:        General: Normal range of motion.     Cervical back: Normal range of motion.  Neurological: She is alert and oriented to person, place, and time.  Skin: Skin is warm and dry.    Review of Systems  Constitutional: Negative.  Negative for chills and fever.  HENT: Negative for congestion and sore throat.   Respiratory: Negative for cough, shortness of breath and wheezing.   Cardiovascular: Negative for chest pain and palpitations.  Gastrointestinal: Negative for nausea and vomiting.  Genitourinary: Negative for dysuria.  Musculoskeletal: Negative for myalgias.  Skin: Negative for itching and rash.  Neurological: Negative for dizziness and headaches.  Endo/Heme/Allergies: Negative for environmental allergies.       NKDA  Psychiatric/Behavioral: Positive for depression (Stabilized with medication prior to discharge) and hallucinations (Hx. delusions & paranoia (Stabilized with medication prior to discharge)). Negative for memory loss, substance abuse and suicidal ideas. The patient is not nervous/anxious and does not have insomnia.     Blood pressure 108/71, pulse 89, temperature 98 F (36.7 C), temperature source Oral, resp. rate 18, height 5' 5.51" (1.664 m), weight 121.6 kg, last menstrual period 10/29/2011, SpO2 100 %.Body mass index is 43.9 kg/m.  See Md's discharge SRA   Sleep:  Number of Hours: 8   Has this patient used any form of tobacco in the last 30 days? (Cigarettes, Smokeless Tobacco, Cigars, and/or Pipes): N/A  Blood Alcohol level:  Lab Results  Component Value Date   ETH <10 01/12/2020   ETH <10 0000000   Metabolic Disorder Labs:  Lab Results  Component Value Date   HGBA1C 6.3 (H) 12/27/2019   MPG 134.11 12/27/2019   MPG 142.72 06/05/2019   Lab Results  Component Value Date   PROLACTIN 60.4 (H) 12/27/2019   Lab Results  Component Value Date   CHOL 226 (H) 12/27/2019   TRIG 88 12/27/2019   HDL 49 12/27/2019   CHOLHDL 4.6 12/27/2019   VLDL 18 12/27/2019   LDLCALC 159 (H) 12/27/2019   LDLCALC 139 (H) 06/05/2019   See Psychiatric Specialty Exam and Suicide Risk Assessment completed by Attending Physician prior to discharge.  Discharge destination:  Home  Is patient on multiple antipsychotic therapies at discharge: Yes,   Do you recommend tapering to monotherapy for antipsychotics?  Yes   Has Patient had three or more failed trials of antipsychotic monotherapy by history:  Yes,   Antipsychotic medications that previously failed include:   1.  Prolixin., 2.  Saphris. and 3.  Olanzapine.  Recommended Plan for Multiple Antipsychotic Therapies: Additional reason(s) for multiple antispychotic treatment:  This is because patient's symptoms has failed to stabilize on an antipsychotinc monotherapy .  Allergies as of 01/18/2020   No Known Allergies     Medication List    TAKE these medications     Indication  acetaminophen 325 MG tablet Commonly known as: TYLENOL Take 650 mg by mouth every 6 (six) hours as needed for mild pain or headache.  Indication: Pain   benztropine 0.5 MG tablet Commonly known as: COGENTIN Take 1 tablet (0.5 mg total) by mouth 2 (two) times daily.  Indication: Extrapyramidal Reaction caused by Medications   gabapentin 300 MG capsule Commonly known as: NEURONTIN TAKE 1 CAPSULE(300 MG) BY MOUTH THREE  TIMES DAILY  Indication: Restless Leg Syndrome   metoCLOPramide 10 MG tablet Commonly known as: REGLAN Take 1 tablet (10 mg total) by mouth as needed for nausea. Take 1 tablet as needed for nausea and vomitting  Indication: Delayed or Stopped Emptying of Stomach   paliperidone 156 MG/ML [place] injection Commonly known as: INVEGA SUSTENNA Inject 1 mL (156 mg total) into the muscle once for 1 dose. (Due on 01-05-20): For mood control  Indication: Schizophrenia   Invega Sustenna injection Generic drug: Paliperidone ER Inject 117 mg into the muscle every 28 (twenty-eight) days. (Due on 02-02-20): For mood control Start taking on: February 02, 2020  Indication: Schizophrenia   risperiDONE 3 MG tablet Commonly known as: RISPERDAL Take 2 tablets (6 mg total) by mouth  at bedtime.  Indication: Schizophrenia   temazepam 30 MG capsule Commonly known as: RESTORIL Take 1 capsule (30 mg total) by mouth at bedtime.  Indication: [place]. Go on 01/23/2020.   Specialty: Behavioral Health Why: You are scheduled for an appointment on 01/23/20 at 2:00 pm in the [facility].  Please bring your insurance card, ID, and discharge summary.  Please wear a mask and come alone or you may bring one person to help you if necessary. Contact information: [place]. [place]. Go on 01/20/2020.   Why: You are scheduled for a clinical assessment appointment on 01/20/20 at 1:00 pm.  This appointment will be held in person and will be approximately 2 hours. Please bring your insurance card, ID and your discharge summary with you.   Contact information: [address] [place] [address] [phone]          Follow-up recommendations: Activity:  As tolerated Diet: As recommended by your primary care doctor. Keep all scheduled follow-up appointments as  recommended.  Comments: Prescriptions given at discharge.  Patient agreeable to plan.  Given opportunity to ask questions.  Appears to feel comfortable with discharge denies any current suicidal or homicidal thought. Patient is also instructed prior to discharge to: Take all medications as prescribed by his/her mental healthcare provider. Report any adverse effects and or reactions from the medicines to his/her outpatient provider promptly. Patient has been instructed & cautioned: To not engage in alcohol and or illegal drug use while on prescription medicines. In the event of worsening symptoms, patient is instructed to call the crisis hotline, 911 and or go to the nearest ED for appropriate evaluation and treatment of symptoms. To follow-up with his/her primary care provider for your other medical issues, concerns and or health care needs.   Signed: Lindell Spar, NP, PMHNP, FNP-BC 01/18/2020, 10:05 AM

## 2020-01-18 NOTE — Progress Notes (Signed)
Recreation Therapy Notes  Date: 3.24.21 Time: 0950 Location: 500 [address] Dayroom  Group Topic: Wellness  Goal Area(s) Addresses:  Patient will define components of whole wellness. Patient will verbalize benefit of whole wellness.  Intervention: Music   Activity: Exercise.  LRT led patients in a series of stretches to loosen up the muscles.  Each patient then got the opportunity to lead the group in an exercise of their choice.  The group was to get at least 30 minutes of exercise.  Patients were encouraged to take breaks if needed and get water as needed.  Education: Wellness, Dentist.   Education Outcome: Acknowledges education/In group clarification offered/Needs additional education.   Clinical Observations/Feedback: Pt did not attend group session.    [name], LRT/CTRS    [name] A 01/18/2020 11:23 AM

## 2020-01-18 NOTE — Plan of Care (Signed)
Pt did not attend recreation therapy group sessions.   Sarabelle Genson, LRT/CTRS 

## 2020-01-18 NOTE — BHH Suicide Risk Assessment (Signed)
[facility] Discharge Suicide Risk Assessment   Principal Problem: homeless due to PI Discharge Diagnoses: Active Problems:   Schizophrenia ([place])    Total Time spent with patient: 45 minutes  Musculoskeletal: Strength & Muscle Tone: within normal limits Gait & Station: normal Patient leans: N/A  Psychiatric Specialty Exam: Review of Systems  Blood pressure (!) 90/53, pulse 76, temperature 98 F (36.7 C), temperature source Oral, resp. rate 20, height 5' 5.51" (1.664 m), weight 121.6 kg, last menstrual period 10/29/2011, SpO2 100 %.Body mass index is 43.9 kg/m.  General Appearance: Casual  Eye Contact::  Good  Speech:  Clear and [id number]  Volume:  Normal  Mood:  Dysphoric  Affect:  Restricted  Thought Process:  Irrelevant  Orientation:  Full (Time, Place, and Person)  Thought Content:  Paranoid Ideation  Suicidal Thoughts:  No  Homicidal Thoughts:  No  Memory:  Immediate;   Fair Recent;   Fair Remote;   Fair  Judgement:  Fair  Insight:  Fair  Psychomotor Activity:  Normal  Concentration:  Good  Recall:  Good  Fund of Knowledge:Good  Language: Good  Akathisia:  Negative  Handed:  Right  AIMS (if indicated):     Assets:  Communication Skills Desire for Improvement  Sleep:  Number of Hours: 8  Cognition: WNL  ADL's:  Intact   Mental Status Per Nursing Assessment::   On Admission:  NA  Demographic Factors:  Unemployed  Loss Factors: Decrease in vocational status  Historical Factors: NA  Risk Reduction Factors:   Positive coping skills or problem solving skills  Continued Clinical Symptoms:  Previous Psychiatric Diagnoses and Treatments  Cognitive Features That Contribute To Risk:  Loss of executive function    Suicide Risk:  Minimal: No identifiable suicidal ideation.  Patients presenting with no risk factors but with morbid ruminations; may be classified as minimal risk based on the severity of the depressive symptoms  Follow-up [place]. Go on 01/23/2020.   Specialty: Behavioral Health Why: You are scheduled for an appointment on 01/23/20 at 2:00 pm in the [facility].  Please bring your insurance card, ID, and discharge summary.  Please wear a mask and come alone or you may bring one person to help you if necessary. Contact information: [place]. [place]. Go on 01/20/2020.   Why: You are scheduled for a clinical assessment appointment on 01/20/20 at 1:00 pm.  This appointment will be held in person and will be approximately 2 hours. Please bring your insurance card, ID and your discharge summary with you.   Contact information: [address] [place] [address] [phone]           Plan Of Care/Follow-up recommendations:  Activity:  full  Jaythen Hamme, MD 01/18/2020, 7:38 AM

## 2020-01-26 ENCOUNTER — Telehealth: Payer: Self-pay

## 2020-01-26 DIAGNOSIS — R1031 Right lower quadrant pain: Secondary | ICD-10-CM | POA: Diagnosis not present

## 2020-01-26 DIAGNOSIS — R110 Nausea: Secondary | ICD-10-CM | POA: Diagnosis not present

## 2020-01-26 DIAGNOSIS — R350 Frequency of micturition: Secondary | ICD-10-CM | POA: Diagnosis not present

## 2020-01-26 DIAGNOSIS — R609 Edema, unspecified: Secondary | ICD-10-CM | POA: Diagnosis not present

## 2020-01-26 DIAGNOSIS — R4189 Other symptoms and signs involving cognitive functions and awareness: Secondary | ICD-10-CM | POA: Diagnosis not present

## 2020-01-26 DIAGNOSIS — R0789 Other chest pain: Secondary | ICD-10-CM | POA: Diagnosis not present

## 2020-01-26 DIAGNOSIS — Z590 Homelessness: Secondary | ICD-10-CM | POA: Diagnosis not present

## 2020-01-26 DIAGNOSIS — R1011 Right upper quadrant pain: Secondary | ICD-10-CM | POA: Diagnosis not present

## 2020-01-26 DIAGNOSIS — R079 Chest pain, unspecified: Secondary | ICD-10-CM | POA: Diagnosis not present

## 2020-01-26 NOTE — Telephone Encounter (Signed)
Pt called no answer, mail box is full

## 2020-01-26 NOTE — Telephone Encounter (Signed)
Pt called states went to ed for convulsions, pt request phone call [phone].

## 2020-01-26 NOTE — Telephone Encounter (Signed)
I reviewed ER notes, no mention of physical convulsions. Her convulsions have been internal due to stress. Pls reassure and let her know that all of this is due to stress and would recommend seeing a counselor to help with stress management. Thanks

## 2020-01-26 NOTE — Telephone Encounter (Signed)
Pt called convulsions have been internal due to stress. pt reassured that all of this is due to stress and would recommend seeing a counselor to help with stress management. Pt stated that she would call and talk to her  a counselor if that doesn't work she will call us back

## 2020-01-30 DIAGNOSIS — E669 Obesity, unspecified: Secondary | ICD-10-CM | POA: Diagnosis not present

## 2020-01-30 DIAGNOSIS — F329 Major depressive disorder, single episode, unspecified: Secondary | ICD-10-CM | POA: Diagnosis not present

## 2020-01-30 DIAGNOSIS — Z6841 Body Mass Index (BMI) 40.0 and over, adult: Secondary | ICD-10-CM | POA: Diagnosis not present

## 2020-01-30 DIAGNOSIS — R7303 Prediabetes: Secondary | ICD-10-CM | POA: Diagnosis not present

## 2020-01-30 DIAGNOSIS — Z8659 Personal history of other mental and behavioral disorders: Secondary | ICD-10-CM | POA: Diagnosis not present

## 2020-01-30 DIAGNOSIS — Z09 Encounter for follow-up examination after completed treatment for conditions other than malignant neoplasm: Secondary | ICD-10-CM | POA: Diagnosis not present

## 2020-01-30 DIAGNOSIS — Z1231 Encounter for screening mammogram for malignant neoplasm of breast: Secondary | ICD-10-CM | POA: Diagnosis not present

## 2020-02-17 DIAGNOSIS — Z23 Encounter for immunization: Secondary | ICD-10-CM | POA: Diagnosis not present

## 2020-02-20 ENCOUNTER — Ambulatory Visit: Payer: Medicare Other | Admitting: Neurology

## 2020-03-15 ENCOUNTER — Other Ambulatory Visit: Payer: Self-pay | Admitting: Neurology

## 2020-03-15 ENCOUNTER — Other Ambulatory Visit: Payer: Self-pay

## 2020-03-15 NOTE — Telephone Encounter (Signed)
Patient left message with [employer]:  "Caller wants refill for Gabapentin through July until she sees her PCP. States that she goes back to PCP in July and PCP is going to start writing Rx in July. She is not having symptoms."

## 2020-03-15 NOTE — Telephone Encounter (Signed)
I pended Rx for Dr.Aquino sign, if appropriate.

## 2020-03-16 DIAGNOSIS — Z23 Encounter for immunization: Secondary | ICD-10-CM | POA: Diagnosis not present

## 2020-04-13 ENCOUNTER — Other Ambulatory Visit: Payer: Self-pay

## 2020-04-13 MED ORDER — GABAPENTIN 300 MG PO CAPS: ORAL_CAPSULE | ORAL | 0 refills | Status: DC

## 2020-04-13 NOTE — Telephone Encounter (Signed)
Refill to last until July called in to pharmacy

## 2020-04-13 NOTE — Telephone Encounter (Signed)
The following message was left with [employer] on 04/13/20 at 12:36 PM.  Caller is needing a refill for gabapentin 300 MG.

## 2020-04-13 NOTE — Telephone Encounter (Signed)
Pls let her know we can give  her refills until follow-up with PCP, but further refills will have to be from PCP. Thanks

## 2020-05-14 DIAGNOSIS — R443 Hallucinations, unspecified: Secondary | ICD-10-CM | POA: Diagnosis not present

## 2020-05-14 DIAGNOSIS — F22 Delusional disorders: Secondary | ICD-10-CM | POA: Diagnosis not present

## 2020-05-16 DIAGNOSIS — F22 Delusional disorders: Secondary | ICD-10-CM | POA: Diagnosis not present

## 2020-05-16 DIAGNOSIS — J3489 Other specified disorders of nose and nasal sinuses: Secondary | ICD-10-CM | POA: Diagnosis not present

## 2020-05-16 DIAGNOSIS — R519 Headache, unspecified: Secondary | ICD-10-CM | POA: Diagnosis not present

## 2020-05-16 DIAGNOSIS — F250 Schizoaffective disorder, bipolar type: Secondary | ICD-10-CM | POA: Diagnosis not present

## 2020-05-16 DIAGNOSIS — F29 Unspecified psychosis not due to a substance or known physiological condition: Secondary | ICD-10-CM | POA: Diagnosis not present

## 2020-06-06 DIAGNOSIS — E119 Type 2 diabetes mellitus without complications: Secondary | ICD-10-CM | POA: Diagnosis not present

## 2020-06-06 DIAGNOSIS — F209 Schizophrenia, unspecified: Secondary | ICD-10-CM | POA: Diagnosis not present

## 2020-06-06 DIAGNOSIS — G4700 Insomnia, unspecified: Secondary | ICD-10-CM | POA: Diagnosis not present

## 2020-06-10 ENCOUNTER — Emergency Department (HOSPITAL_BASED_OUTPATIENT_CLINIC_OR_DEPARTMENT_OTHER)
Admission: EM | Admit: 2020-06-10 | Discharge: 2020-06-10 | Disposition: A | Discharge status: Home / Self Care | Payer: Medicare Other | Attending: Emergency Medicine | Admitting: Emergency Medicine

## 2020-06-10 ENCOUNTER — Other Ambulatory Visit: Payer: Self-pay

## 2020-06-10 ENCOUNTER — Encounter (HOSPITAL_BASED_OUTPATIENT_CLINIC_OR_DEPARTMENT_OTHER): Payer: Self-pay | Admitting: Emergency Medicine

## 2020-06-10 DIAGNOSIS — I10 Essential (primary) hypertension: Secondary | ICD-10-CM | POA: Insufficient documentation

## 2020-06-10 DIAGNOSIS — E119 Type 2 diabetes mellitus without complications: Secondary | ICD-10-CM | POA: Insufficient documentation

## 2020-06-10 DIAGNOSIS — R69 Illness, unspecified: Secondary | ICD-10-CM | POA: Diagnosis not present

## 2020-06-10 DIAGNOSIS — F22 Delusional disorders: Secondary | ICD-10-CM | POA: Diagnosis not present

## 2020-06-10 DIAGNOSIS — Z79899 Other long term (current) drug therapy: Secondary | ICD-10-CM | POA: Insufficient documentation

## 2020-06-10 DIAGNOSIS — R42 Dizziness and giddiness: Secondary | ICD-10-CM | POA: Diagnosis present

## 2020-06-10 DIAGNOSIS — R5381 Other malaise: Secondary | ICD-10-CM | POA: Diagnosis not present

## 2020-06-10 NOTE — ED Notes (Signed)
Pt wanting to leave. Requesting a cab voucher since she arrived EMS and is homeless.  When cab arrived pt refused to get in and stated she wanted to be seen after all. Pt told she would not be able to get another cab voucher.

## 2020-06-10 NOTE — ED Notes (Signed)
Dr. Karle Starch ED Provider at bedside.
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
## 2020-06-10 NOTE — ED Notes (Signed)
Attempted to get pt discharge vitals but pt refuse since she was being discharged
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
## 2020-06-10 NOTE — ED Notes (Signed)
Pt rounded on. States she is getting worse and would like to see a doctor. I reassured her that we were doing everything we could to get her back soon.

## 2020-06-11 ENCOUNTER — Telehealth: Payer: Self-pay | Admitting: Neurology

## 2020-06-11 NOTE — Telephone Encounter (Signed)
Patient needs Gabapentin refill sent to [facility] [address] in [place].   She states she is also having neurological convulsions and seizures due to the same place in her head that Dr Delice Lesch looked at. She was wanting to see Dr Delice Lesch asap, scheduled a follow up visit for first available in 12/2020 and added appointment to waitlist. Patient requests a call back.

## 2020-06-12 ENCOUNTER — Emergency Department (HOSPITAL_BASED_OUTPATIENT_CLINIC_OR_DEPARTMENT_OTHER): Payer: Medicare Other

## 2020-06-12 ENCOUNTER — Other Ambulatory Visit: Payer: Self-pay

## 2020-06-12 ENCOUNTER — Encounter (HOSPITAL_BASED_OUTPATIENT_CLINIC_OR_DEPARTMENT_OTHER): Payer: Self-pay

## 2020-06-12 ENCOUNTER — Emergency Department (HOSPITAL_BASED_OUTPATIENT_CLINIC_OR_DEPARTMENT_OTHER)
Admission: EM | Admit: 2020-06-12 | Discharge: 2020-06-12 | Disposition: A | Discharge status: Home / Self Care | Payer: Medicare Other | Attending: Emergency Medicine | Admitting: Emergency Medicine

## 2020-06-12 DIAGNOSIS — R52 Pain, unspecified: Secondary | ICD-10-CM | POA: Diagnosis not present

## 2020-06-12 DIAGNOSIS — E119 Type 2 diabetes mellitus without complications: Secondary | ICD-10-CM | POA: Diagnosis not present

## 2020-06-12 DIAGNOSIS — Z79899 Other long term (current) drug therapy: Secondary | ICD-10-CM | POA: Diagnosis not present

## 2020-06-12 DIAGNOSIS — R450 Nervousness: Secondary | ICD-10-CM | POA: Diagnosis not present

## 2020-06-12 DIAGNOSIS — R109 Unspecified abdominal pain: Secondary | ICD-10-CM | POA: Diagnosis not present

## 2020-06-12 DIAGNOSIS — R1084 Generalized abdominal pain: Secondary | ICD-10-CM | POA: Diagnosis not present

## 2020-06-12 DIAGNOSIS — J9811 Atelectasis: Secondary | ICD-10-CM | POA: Diagnosis not present

## 2020-06-12 DIAGNOSIS — I10 Essential (primary) hypertension: Secondary | ICD-10-CM | POA: Diagnosis not present

## 2020-06-12 LAB — COMPREHENSIVE METABOLIC PANEL
ALT: 19 U/L (ref 0–44)
AST: 19 U/L (ref 15–41)
Albumin: 4.3 g/dL (ref 3.5–5.0)
Alkaline Phosphatase: 110 U/L (ref 38–126)
Anion gap: 11 (ref 5–15)
BUN: 16 mg/dL (ref 6–20)
CO2: 22 mmol/L (ref 22–32)
Calcium: 9.2 mg/dL (ref 8.9–10.3)
Chloride: 105 mmol/L (ref 98–111)
Creatinine, Ser: 0.88 mg/dL (ref 0.44–1.00)
GFR calc Af Amer: >60 mL/min (ref >60)
GFR calc non Af Amer: >60 mL/min (ref >60)
Glucose, Bld: 118 mg/dL — ABNORMAL HIGH (ref 70–99)
Potassium: 4.1 mmol/L (ref 3.5–5.1)
Sodium: 138 mmol/L (ref 135–145)
Total Bilirubin: 0.2 mg/dL — ABNORMAL LOW (ref 0.3–1.2)
Total Protein: 8.1 g/dL (ref 6.5–8.1)

## 2020-06-12 LAB — CBC
HCT: 41.1 % (ref 36.0–46.0)
Hemoglobin: 12.7 g/dL (ref 12.0–15.0)
MCH: 25.6 pg — ABNORMAL LOW (ref 26.0–34.0)
MCHC: 30.9 g/dL (ref 30.0–36.0)
MCV: 82.9 fL (ref 80.0–100.0)
Platelets: 278 10*3/uL (ref 150–400)
RBC: 4.96 MIL/uL (ref 3.87–5.11)
RDW: 14.2 % (ref 11.5–15.5)
WBC: 6.6 10*3/uL (ref 4.0–10.5)
nRBC: 0 % (ref 0.0–0.2)

## 2020-06-12 LAB — RAPID URINE DRUG SCREEN, HOSP PERFORMED
Amphetamines: NOT DETECTED
Barbiturates: NOT DETECTED
Benzodiazepines: NOT DETECTED
Cocaine: NOT DETECTED
Opiates: NOT DETECTED
Tetrahydrocannabinol: NOT DETECTED

## 2020-06-12 LAB — LIPASE, BLOOD: Lipase: 24 U/L (ref 11–51)

## 2020-06-12 LAB — URINALYSIS, ROUTINE W REFLEX MICROSCOPIC
Bilirubin Urine: NEGATIVE
Glucose, UA: NEGATIVE mg/dL
Hgb urine dipstick: NEGATIVE
Ketones, ur: 15 mg/dL — AB
Leukocytes,Ua: NEGATIVE
Nitrite: NEGATIVE
Protein, ur: NEGATIVE mg/dL
Specific Gravity, Urine: 1.025 (ref 1.005–1.030)
pH: 6 (ref 5.0–8.0)

## 2020-06-12 LAB — SALICYLATE LEVEL: Salicylate Lvl: <7 mg/dL — ABNORMAL LOW (ref 7.0–30.0)

## 2020-06-12 LAB — MAGNESIUM: Magnesium: 2.1 mg/dL (ref 1.7–2.4)

## 2020-06-12 LAB — TROPONIN I (HIGH SENSITIVITY): Troponin I (High Sensitivity): 3 ng/L (ref <18)

## 2020-06-12 LAB — PREGNANCY, URINE: Preg Test, Ur: NEGATIVE

## 2020-06-12 LAB — ACETAMINOPHEN LEVEL: Acetaminophen (Tylenol), Serum: <10 ug/mL — ABNORMAL LOW (ref 10–30)

## 2020-06-12 MED ORDER — SODIUM CHLORIDE 0.9 % IV BOLUS
500.0000 mL | Freq: Once | INTRAVENOUS | Status: AC
  Administered 2020-06-12: 500 mL via INTRAVENOUS

## 2020-06-12 NOTE — BHH Counselor (Signed)
TTS attempted to complete assessment. Patient is pleasant, however, repeatedly declines to participate. She states "I'm here for my physical health. They're going to get me a sponsor for my mental health." Patient denies SI/HI/AVH. However, per chart review patient came to ED on this date and 2 days ago appearing delusional and had tangential speech. Thoughts were disorganized. TTS night shift to attempt to assess patient.

## 2020-06-12 NOTE — ED Provider Notes (Signed)
[facility] [facility] EMERGENCY DEPARTMENT Provider Note   CSN: [id number] Arrival date & time: 06/12/20  1134     History Chief Complaint  Patient presents with  . Abdominal Pain    [name] is a 54 y.o. female history of schizophrenia, homelessness, diabetes, depression, delusional disorder, anxiety, obesity.  Patient arrived via EMS today for abdominal pain nausea and vomiting that began yesterday.  On my initial evaluation patient reports that the symptoms have resolved and she is no longer having any nausea vomiting or abdominal pain she reports that she will occasionally have this problem but it is not severe.  She now reports that she is feeling lightheaded and this has been going on for several days, she feels that she is going to have a "convulsion" but she is not able to describe to me what a compulsion is she reports that she does not take any medications for this but she is supposed to see a neurologist over in [facility] for treatment.  Patient is very tangential in her speech and then begins repeating the saying that she is "must be a boy" 4-5 times.  She denies any SI/HI, drug use or ingestions or any additional concerns.  HPI     Past Medical History:  Diagnosis Date  . Anxiety   . Delusional disorder ([place])   . Diabetes mellitus without complication ([place])   . Homelessness   . Hypertension   . Major depressive disorder, recurrent, severe with psychotic features ([place])   . Overdose   . Paranoid schizophrenia ([place])   . Schizophrenia ([place])   . Tremors of nervous system     Patient Active Problem List   Diagnosis Date Noted  . Paranoid schizophrenia ([place]) 12/26/2019  . Obesity 11/17/2019  . Type 2 diabetes mellitus without complication, without long-term current use of insulin ([place]) 09/21/2019  . Schizoaffective disorder, bipolar type ([place]) 07/21/2019  . MDD (major depressive disorder) 07/13/2019  . Major depressive disorder, recurrent episode, severe,  with psychosis ([place])   . Schizophrenia ([place]) 03/26/2018  . Delusional disorder ([place]) 01/07/2018  . Anxiety 03/29/2016  . Homelessness 11/18/2015  . Tobacco use disorder 07/05/2015  . Hypokalemia 05/30/2015  . Obstipation 05/30/2015  . Prolonged QT interval 05/30/2015  . Hypertension 05/30/2015  . Suicidal ideations 03/12/2015  . Diabetes ([place]) 09/06/2012  . Paranoia ([place]) 04/16/2012    History reviewed. No pertinent surgical history.   OB History   No obstetric history on file.     Family History  Problem Relation Age of Onset  . Hypertension Mother   . Diabetes Mother   . Aneurysm Mother   . Hypertension Father   . Diabetes Father   . Hypertension Sister   . Diabetes Sister   . Hypertension Other   . Diabetes Other     Social History   Tobacco Use  . Smoking status: Never Smoker  . Smokeless tobacco: Never Used  Vaping Use  . Vaping Use: Never used  Substance Use Topics  . Alcohol use: No  . Drug use: No    Home Medications Prior to Admission medications   Medication Sig Start Date End Date Taking? Authorizing Provider  acetaminophen (TYLENOL) 325 MG tablet Take 650 mg by mouth every 6 (six) hours as needed for mild pain or headache.    [provider]  benztropine (COGENTIN) 0.5 MG tablet Take 1 tablet (0.5 mg total) by mouth 2 (two) times daily. Patient not taking: Reported on 01/15/2020 12/29/19   Jake Samples,  Aaron Edelman, MD  gabapentin (NEURONTIN) 300 MG capsule TAKE 1 CAPSULE(300 MG) BY MOUTH THREE TIMES DAILY 04/13/20   Cameron Sprang, MD  metoCLOPramide (REGLAN) 10 MG tablet Take 1 tablet (10 mg total) by mouth as needed for nausea. Take 1 tablet as needed for nausea and vomitting Patient not taking: Reported on 12/27/2019 09/21/19   Cameron Sprang, MD  paliperidone (INVEGA SUSTENNA) 156 MG/ML SUSY injection Inject 1 mL (156 mg total) into the muscle once for 1 dose. (Due on 01-05-20): For mood control 01/05/20 01/05/20  Lindell Spar I, NP  Paliperidone ER  (INVEGA SUSTENNA) injection Inject 117 mg into the muscle every 28 (twenty-eight) days. (Due on 02-02-20): For mood control 02/02/20   Lindell Spar I, NP  risperiDONE (RISPERDAL) 3 MG tablet Take 2 tablets (6 mg total) by mouth at bedtime. Patient not taking: Reported on 01/15/2020 12/29/19   Johnn Hai, MD  temazepam (RESTORIL) 30 MG capsule Take 1 capsule (30 mg total) by mouth at bedtime. Patient not taking: Reported on 01/15/2020 12/29/19   Johnn Hai, MD    Allergies    Pork-derived products  Review of Systems   Review of Systems  Unable to perform ROS: Psychiatric disorder    Physical Exam Updated Vital Signs BP 124/72 (BP Location: Left Wrist)   Pulse (!) 54   Temp 98.7 F (37.1 C) (Oral)   Resp 20   LMP 10/29/2011 Comment: NEG U PREG 02/24/17  SpO2 98%   Physical Exam Constitutional:      General: She is not in acute distress.    Appearance: Normal appearance. She is well-developed. She is obese. She is not ill-appearing or diaphoretic.  HENT:     Head: Normocephalic and atraumatic.  Eyes:     General: Vision grossly intact. Gaze aligned appropriately.     Pupils: Pupils are equal, round, and reactive to light.  Neck:     Trachea: Trachea and phonation normal.  Pulmonary:     Effort: Pulmonary effort is normal. No respiratory distress.  Abdominal:     General: There is no distension.     Palpations: Abdomen is soft.     Tenderness: There is no abdominal tenderness. There is no guarding or rebound.  Musculoskeletal:        General: Normal range of motion.     Cervical back: Normal range of motion.  Skin:    General: Skin is warm and dry.  Neurological:     Mental Status: She is alert.     GCS: GCS eye subscore is 4. GCS verbal subscore is 5. GCS motor subscore is 6.     Comments: Speech is clear and goal oriented, follows commands Major Cranial nerves without deficit, no facial droop Moves extremities without ataxia, coordination intact  Psychiatric:        Mood  and Affect: Mood is anxious.        Speech: Speech is rapid and pressured and tangential.        Behavior: Behavior is hyperactive. Behavior is cooperative.        Thought Content: Thought content does not include homicidal or suicidal ideation.     ED Results / Procedures / Treatments   Labs (all labs ordered are listed, but only abnormal results are displayed) Labs Reviewed  COMPREHENSIVE METABOLIC PANEL - Abnormal; Notable for the following components:      Result Value   Glucose, Bld 118 (*)    Total Bilirubin 0.2 (*)    All  other components within normal limits  CBC - Abnormal; Notable for the following components:   MCH 25.6 (*)    All other components within normal limits  URINALYSIS, ROUTINE W REFLEX MICROSCOPIC - Abnormal; Notable for the following components:   Ketones, ur 15 (*)    All other components within normal limits  LIPASE, BLOOD  PREGNANCY, URINE  RAPID URINE DRUG SCREEN, HOSP PERFORMED    EKG EKG Interpretation  Date/Time:  Tuesday June 12 2020 15:51:23 EDT Ventricular Rate:  55 PR Interval:    QRS Duration: 90 QT Interval:  453 QTC Calculation: 434 R Axis:   47 Text Interpretation: Sinus rhythm Borderline T abnormalities, anterior leads When compared to prior, no significant changes seen . NO STEMI Confirmed by Antony Blackbird [phone]) on 06/12/2020 3:58:02 PM   Radiology No results found.  Procedures Procedures (including critical care time)  Medications Ordered in ED Medications  sodium chloride 0.9 % bolus 500 mL (500 mLs Intravenous New Bag/Given 06/12/20 1609)    ED Course  I have reviewed the triage vital signs and the nursing notes.  Pertinent labs & imaging results that were available during my care of the patient were reviewed by me and considered in my medical decision making (see chart for details).    MDM Rules/Calculators/A&P                          Additional history obtained from: 1. Nursing notes from this  visit. 2. Electronic medical record reviewed, patient had ED visit on 06/10/2020, diagnosis of delusional disorder.  She had presented for concern of convulsions and lightheadedness.  Encouraged to follow-up with her psychiatrist and PCP.  It appears this is the patient's fifth ED encounter at various facilities in the last 1 month.  Various diagnoses including paranoia, non-intractable headache, delusions.  It appears she had a CT scan of her head on May 16, 2020 which was unremarkable, showed only mild ethmoid sinus mucosal thickening. --------------------------- Patient had initially arrived with EMS for abdominal pain nausea and vomiting on my exam she denies any of those concerns.  She reports vague lightheadedness only.  She is tangential in her speech and appears paranoid, she is very difficult to follow.  She does not appear to be in withdrawal.  Abdominal pain labs obtained in triage, will add EKG given lightheadedness.  Neurologic examination unremarkable.  Will give patient fluid bolus for possible orthostasis.  Lipase normal limits, doubt pancreatitis. Urinalysis shows ketones suggestive of dehydration, no evidence of UTI or hemoglobin to suggest kidney stone.  Patient was given fluid bolus.  Does not appear to be in DKA. Pregnancy test negative. CMP shows glucose 118, total bilirubin 0.2 otherwise normal limits.  No emergent electrolyte derangement, AKI, LFT elevation or gap. CBC shows no evidence of ptosis or anemia.  EKG: Sinus rhythm Borderline T abnormalities, anterior leads When compared to prior, no significant changes seen . NO STEMI Confirmed by Tegeler, Gerald Stabs [phone]) on 06/12/2020 3:58:02 PM - Patient reevaluated she is resting comfortably no acute distress reports that she is feeling somewhat better but is reporting social concerns.  Informed by RN that patient is being cyber stalked and that the black community and church are against her.  Will consult TTS for possible paranoia  and delusions and at medical clearance labs. --------------------------------- --------------------------------- 4:55 PM: Reviewed telemetry strip given by RN that was concerning for possibly ventricular tachycardia.  Reviewed with Dr. Sherry Ruffing, able to march  out clear QRSs within the strip appears as artifact motion by the patient.  I then reevaluate the patient she reports that she is feeling better she denies any chest pain shortness of breath or palpitations, she has no other complaints.  Doubt ventricular tachycardia at this time, labs including magnesium, troponin were added. - Magnesium within normal limits. Salicylate level negative Acetaminophen level negative  High-sensitivity troponin within normal limits UDS negative. ------------------------------- ------------------------------- Care handoff given to Deno Etienne PA-C at shift change.  Plan of care is to reevaluate patient, follow-up on pending chest x-ray and TTS recommendations.  Pending reassuring chest x-ray and reevaluation anticipate patient will be medically cleared.  Final disposition per oncoming team and psychiatry.   Note: Portions of this report may have been transcribed using voice recognition software. Every effort was made to ensure accuracy; however, inadvertent computerized transcription errors may still be present. Final Clinical Impression(s) / ED Diagnoses Final diagnoses:  None    Rx / DC Orders ED Discharge Orders    None       Gari Crown 06/12/20 1850    Tegeler, Gwenyth Allegra, MD 06/12/20 2329

## 2020-06-12 NOTE — ED Notes (Signed)
PT laying flat for orthostatic vital signs

## 2020-06-12 NOTE — ED Triage Notes (Signed)
Pt c/o left side abd pain, n/v x today-NAD-to triage in w/c-was brought in by [facility]

## 2020-06-12 NOTE — ED Triage Notes (Signed)
Report taken from EMS, Pt called EMS for abdominal pain N/V. EMS noted only what appeared to be spit in the paper towel that patient states is her vomit. Pt placed in wheelchair in waiting room.

## 2020-06-12 NOTE — ED Notes (Signed)
Pt sts she wants me to call [employer] because she is being cyber stalked; sts the black community is against her; sts a church is against her and spread lies about her

## 2020-06-12 NOTE — Telephone Encounter (Signed)
We had discussed that her MRI brain is normal. The symptoms she is having are not neurological in nature and due to anxiety/stress. Gabapentin is helpful for anxiety, she will need to ask her PCP for refills for this for anxiety, or see a psychiatrist for anxiety.

## 2020-06-12 NOTE — ED Provider Notes (Signed)
Patient was received at shift handoff from [facility], PA-C he provided HPI, current work-up, and likely disposition.  Please see his note for full detail  In short patient presents with abdominal pain and has medical history of schizophrenia homelessness, diabetes, depression, delusional disorder anxiety and obesity.  She initially had abdominal pain with nausea and vomiting that started yesterday but since resolved.  While at the emergency department she states she has lightheadedness that is has been going on for last few days.  Drug and toxin were performed came back negative.  UA does not show signs of UTI or [place].  CMP does not show electrolyte abnormality, elevated liver enzymes or signs of AKI.  EKG was sinus bradycardia without signs of ischemia, troponin was less than 3.  Lipase 24.  CBC does not show leukocytosis or signs of anemia.  Pending TTS consult as well as chest x-ray. Physical Exam  BP (!) 143/95 (BP Location: Right Arm)   Pulse (!) 53   Temp 98.7 F (37.1 C) (Oral)   Resp 18   LMP 10/29/2011 Comment: NEG U PREG 02/24/17  SpO2 100%   Physical Exam Vitals and nursing note reviewed.  Constitutional:      General: She is not in acute distress.    Appearance: Normal appearance. She is not ill-appearing or diaphoretic.  HENT:     Head: Normocephalic and atraumatic.     Nose: No congestion or rhinorrhea.  Eyes:     General: No scleral icterus.       Right eye: No discharge.        Left eye: No discharge.     Conjunctiva/sclera: Conjunctivae normal.  Pulmonary:     Effort: Pulmonary effort is normal. No respiratory distress.     Breath sounds: Normal breath sounds. No wheezing.  Musculoskeletal:     Cervical back: Neck supple.     Right lower leg: No edema.     Left lower leg: No edema.  Skin:    General: Skin is warm and dry.     Coloration: Skin is not jaundiced or pale.  Neurological:     Mental Status: She is alert and oriented to person, place, and time.   Psychiatric:        Mood and Affect: Mood normal.     ED Course/Procedures     Procedures  MDM  I have personally reviewed all imaging, labs and have interpreted them.  Chest x-ray does not show any acute abnormalities shows low left lung volumes with some atelectasis.  TTS try to speak with patient but she was uncooperative would not speak with them as she does not feel she needs psych evaluation.  Psych has cleared her.    I have reviewed all labs and vitals and patient is medically stable for discharge.  Patient denies suicidal or homicidal ideations, delusions, hallucinations.  Patient appears to be resting calmly in bed showing no acute signs stress, vital signs remained stable does not meet criteria to be admitted to the hospital.  Differential diagnosis includes anxiety, gastritis, dehydration.  I have recommended that the patient follows up with her primary care provider for further evaluation management.  Patient was discussed with attending who agrees assessment and plan.  Patient given at home care as well as strict return precautions.  Patient verbalized that she understood and agreed to plan.       Marcello Fennel, PA-C 06/12/20 2057    Tegeler, Gwenyth Allegra, MD 06/12/20 2329

## 2020-06-12 NOTE — Telephone Encounter (Signed)
Pt called no answer LVM to call back when she calls back needs to be informed that she and Dr Delice Lesch had discussed that her MRI brain is normal. The symptoms she is having are not neurological in nature and due to anxiety/stress. Gabapentin is helpful for anxiety, she will need to ask her PCP for refills for this for anxiety, or see a psychiatrist for anxiety

## 2020-06-12 NOTE — ED Notes (Signed)
TTS consult complete

## 2020-06-12 NOTE — Discharge Instructions (Addendum)
You have been seen here for abdominal pain.  Lab work and imaging all look reassuring.  I recommend over-the-counter pain medications like ibuprofen or Tylenol every 6 hours as needed please follow dosing on the back of bottle.  I recommend following up with your primary care provider in 2 weeks time for reevaluation.  I want you to come back to emergency department if you develop chest pain, shortness of breath, severe abdominal pain, uncontrolled nausea, vomiting, diarrhea as these symptoms require further evaluation and management.

## 2020-06-12 NOTE — ED Notes (Signed)
Pt monitor alarm alerted Vfib-Vtach; Strip printed and given to EDP, Athena Masse; reviewed by Dr. Gustavus Messing.

## 2020-06-13 DIAGNOSIS — R112 Nausea with vomiting, unspecified: Secondary | ICD-10-CM | POA: Diagnosis not present

## 2020-06-13 DIAGNOSIS — R519 Headache, unspecified: Secondary | ICD-10-CM | POA: Diagnosis not present

## 2020-06-13 DIAGNOSIS — R824 Acetonuria: Secondary | ICD-10-CM | POA: Diagnosis not present

## 2020-06-13 DIAGNOSIS — R42 Dizziness and giddiness: Secondary | ICD-10-CM | POA: Diagnosis not present

## 2020-06-13 DIAGNOSIS — R9431 Abnormal electrocardiogram [ECG] [EKG]: Secondary | ICD-10-CM | POA: Diagnosis not present

## 2020-06-13 DIAGNOSIS — Z3202 Encounter for pregnancy test, result negative: Secondary | ICD-10-CM | POA: Diagnosis not present

## 2020-06-13 DIAGNOSIS — I959 Hypotension, unspecified: Secondary | ICD-10-CM | POA: Diagnosis not present

## 2020-06-13 DIAGNOSIS — R109 Unspecified abdominal pain: Secondary | ICD-10-CM | POA: Diagnosis not present

## 2020-06-13 DIAGNOSIS — R001 Bradycardia, unspecified: Secondary | ICD-10-CM | POA: Diagnosis not present

## 2020-06-14 DIAGNOSIS — R001 Bradycardia, unspecified: Secondary | ICD-10-CM | POA: Diagnosis not present

## 2020-06-16 DIAGNOSIS — R519 Headache, unspecified: Secondary | ICD-10-CM | POA: Diagnosis not present

## 2020-06-16 DIAGNOSIS — F29 Unspecified psychosis not due to a substance or known physiological condition: Secondary | ICD-10-CM | POA: Diagnosis not present

## 2020-06-16 DIAGNOSIS — G8929 Other chronic pain: Secondary | ICD-10-CM | POA: Diagnosis not present

## 2020-06-16 DIAGNOSIS — R450 Nervousness: Secondary | ICD-10-CM | POA: Diagnosis not present

## 2020-06-22 DIAGNOSIS — Z7984 Long term (current) use of oral hypoglycemic drugs: Secondary | ICD-10-CM | POA: Diagnosis not present

## 2020-06-22 DIAGNOSIS — F22 Delusional disorders: Secondary | ICD-10-CM | POA: Diagnosis not present

## 2020-06-22 DIAGNOSIS — F250 Schizoaffective disorder, bipolar type: Secondary | ICD-10-CM | POA: Diagnosis not present

## 2020-06-22 DIAGNOSIS — Z20822 Contact with and (suspected) exposure to covid-19: Secondary | ICD-10-CM | POA: Diagnosis not present

## 2020-06-22 DIAGNOSIS — F600 Paranoid personality disorder: Secondary | ICD-10-CM | POA: Diagnosis not present

## 2020-06-22 DIAGNOSIS — E119 Type 2 diabetes mellitus without complications: Secondary | ICD-10-CM | POA: Diagnosis not present

## 2020-06-22 DIAGNOSIS — Z9114 Patient's other noncompliance with medication regimen: Secondary | ICD-10-CM | POA: Diagnosis not present

## 2020-06-23 DIAGNOSIS — F22 Delusional disorders: Secondary | ICD-10-CM | POA: Diagnosis present

## 2020-06-23 DIAGNOSIS — Z79899 Other long term (current) drug therapy: Secondary | ICD-10-CM | POA: Diagnosis not present

## 2020-06-23 DIAGNOSIS — E119 Type 2 diabetes mellitus without complications: Secondary | ICD-10-CM | POA: Diagnosis present

## 2020-06-23 DIAGNOSIS — F600 Paranoid personality disorder: Secondary | ICD-10-CM | POA: Diagnosis not present

## 2020-06-23 DIAGNOSIS — Z20822 Contact with and (suspected) exposure to covid-19: Secondary | ICD-10-CM | POA: Diagnosis present

## 2020-06-23 DIAGNOSIS — F250 Schizoaffective disorder, bipolar type: Secondary | ICD-10-CM | POA: Diagnosis present

## 2020-06-23 DIAGNOSIS — Z7984 Long term (current) use of oral hypoglycemic drugs: Secondary | ICD-10-CM | POA: Diagnosis not present

## 2020-06-23 DIAGNOSIS — Z9114 Patient's other noncompliance with medication regimen: Secondary | ICD-10-CM | POA: Diagnosis not present

## 2020-07-04 DIAGNOSIS — R7303 Prediabetes: Secondary | ICD-10-CM | POA: Diagnosis not present

## 2020-07-04 DIAGNOSIS — Z79899 Other long term (current) drug therapy: Secondary | ICD-10-CM | POA: Diagnosis not present

## 2020-07-04 DIAGNOSIS — R110 Nausea: Secondary | ICD-10-CM | POA: Diagnosis not present

## 2020-07-04 DIAGNOSIS — Z7984 Long term (current) use of oral hypoglycemic drugs: Secondary | ICD-10-CM | POA: Diagnosis not present

## 2020-07-04 DIAGNOSIS — E119 Type 2 diabetes mellitus without complications: Secondary | ICD-10-CM | POA: Diagnosis not present

## 2020-07-04 DIAGNOSIS — F259 Schizoaffective disorder, unspecified: Secondary | ICD-10-CM | POA: Diagnosis not present

## 2020-07-04 DIAGNOSIS — F22 Delusional disorders: Secondary | ICD-10-CM | POA: Diagnosis not present

## 2020-07-16 DIAGNOSIS — Z7984 Long term (current) use of oral hypoglycemic drugs: Secondary | ICD-10-CM | POA: Diagnosis not present

## 2020-07-16 DIAGNOSIS — R110 Nausea: Secondary | ICD-10-CM | POA: Diagnosis not present

## 2020-07-16 DIAGNOSIS — E119 Type 2 diabetes mellitus without complications: Secondary | ICD-10-CM | POA: Diagnosis not present

## 2020-07-16 DIAGNOSIS — R42 Dizziness and giddiness: Secondary | ICD-10-CM | POA: Diagnosis not present

## 2020-07-16 DIAGNOSIS — Z79899 Other long term (current) drug therapy: Secondary | ICD-10-CM | POA: Diagnosis not present

## 2020-07-16 DIAGNOSIS — I252 Old myocardial infarction: Secondary | ICD-10-CM | POA: Diagnosis not present

## 2020-07-16 DIAGNOSIS — R0789 Other chest pain: Secondary | ICD-10-CM | POA: Diagnosis not present

## 2020-07-16 DIAGNOSIS — I10 Essential (primary) hypertension: Secondary | ICD-10-CM | POA: Diagnosis not present

## 2020-07-16 DIAGNOSIS — E669 Obesity, unspecified: Secondary | ICD-10-CM | POA: Diagnosis not present

## 2020-07-16 DIAGNOSIS — R079 Chest pain, unspecified: Secondary | ICD-10-CM | POA: Diagnosis not present

## 2020-07-16 DIAGNOSIS — R9431 Abnormal electrocardiogram [ECG] [EKG]: Secondary | ICD-10-CM | POA: Diagnosis not present

## 2020-07-16 DIAGNOSIS — F250 Schizoaffective disorder, bipolar type: Secondary | ICD-10-CM | POA: Diagnosis not present

## 2020-07-17 DIAGNOSIS — E119 Type 2 diabetes mellitus without complications: Secondary | ICD-10-CM | POA: Diagnosis not present

## 2020-07-17 DIAGNOSIS — E669 Obesity, unspecified: Secondary | ICD-10-CM | POA: Diagnosis not present

## 2020-07-17 DIAGNOSIS — F250 Schizoaffective disorder, bipolar type: Secondary | ICD-10-CM | POA: Diagnosis not present

## 2020-07-17 DIAGNOSIS — R079 Chest pain, unspecified: Secondary | ICD-10-CM | POA: Diagnosis not present

## 2020-07-26 DIAGNOSIS — Z6841 Body Mass Index (BMI) 40.0 and over, adult: Secondary | ICD-10-CM | POA: Diagnosis not present

## 2020-07-26 DIAGNOSIS — G43009 Migraine without aura, not intractable, without status migrainosus: Secondary | ICD-10-CM | POA: Diagnosis not present

## 2020-07-30 DIAGNOSIS — R9431 Abnormal electrocardiogram [ECG] [EKG]: Secondary | ICD-10-CM | POA: Diagnosis not present

## 2020-07-30 DIAGNOSIS — R079 Chest pain, unspecified: Secondary | ICD-10-CM | POA: Diagnosis not present

## 2020-07-30 DIAGNOSIS — F419 Anxiety disorder, unspecified: Secondary | ICD-10-CM | POA: Diagnosis not present

## 2020-07-30 DIAGNOSIS — Z6379 Other stressful life events affecting family and household: Secondary | ICD-10-CM | POA: Diagnosis not present

## 2020-07-30 DIAGNOSIS — Z599 Problem related to housing and economic circumstances, unspecified: Secondary | ICD-10-CM | POA: Diagnosis not present

## 2020-07-30 DIAGNOSIS — M940 Chondrocostal junction syndrome [Tietze]: Secondary | ICD-10-CM | POA: Diagnosis not present

## 2020-08-18 DIAGNOSIS — R9389 Abnormal findings on diagnostic imaging of other specified body structures: Secondary | ICD-10-CM | POA: Diagnosis not present

## 2020-08-18 DIAGNOSIS — N950 Postmenopausal bleeding: Secondary | ICD-10-CM | POA: Diagnosis not present

## 2020-08-18 DIAGNOSIS — N939 Abnormal uterine and vaginal bleeding, unspecified: Secondary | ICD-10-CM | POA: Diagnosis not present

## 2020-08-18 DIAGNOSIS — Z780 Asymptomatic menopausal state: Secondary | ICD-10-CM | POA: Diagnosis not present

## 2020-08-18 DIAGNOSIS — D252 Subserosal leiomyoma of uterus: Secondary | ICD-10-CM | POA: Diagnosis not present

## 2020-08-18 DIAGNOSIS — F22 Delusional disorders: Secondary | ICD-10-CM | POA: Diagnosis not present

## 2020-08-18 DIAGNOSIS — D251 Intramural leiomyoma of uterus: Secondary | ICD-10-CM | POA: Diagnosis not present

## 2020-08-18 DIAGNOSIS — D259 Leiomyoma of uterus, unspecified: Secondary | ICD-10-CM | POA: Diagnosis not present

## 2020-09-03 DIAGNOSIS — R42 Dizziness and giddiness: Secondary | ICD-10-CM | POA: Diagnosis not present

## 2020-09-03 DIAGNOSIS — G43009 Migraine without aura, not intractable, without status migrainosus: Secondary | ICD-10-CM | POA: Diagnosis not present

## 2020-09-03 DIAGNOSIS — R55 Syncope and collapse: Secondary | ICD-10-CM | POA: Diagnosis not present

## 2020-09-10 DIAGNOSIS — F4312 Post-traumatic stress disorder, chronic: Secondary | ICD-10-CM | POA: Diagnosis not present

## 2020-09-10 DIAGNOSIS — N950 Postmenopausal bleeding: Secondary | ICD-10-CM | POA: Diagnosis not present

## 2020-09-10 DIAGNOSIS — Z8659 Personal history of other mental and behavioral disorders: Secondary | ICD-10-CM | POA: Diagnosis not present

## 2020-09-12 DIAGNOSIS — F22 Delusional disorders: Secondary | ICD-10-CM | POA: Diagnosis not present

## 2020-09-12 DIAGNOSIS — E119 Type 2 diabetes mellitus without complications: Secondary | ICD-10-CM | POA: Diagnosis not present

## 2020-09-12 DIAGNOSIS — G4700 Insomnia, unspecified: Secondary | ICD-10-CM | POA: Diagnosis not present

## 2020-09-12 DIAGNOSIS — F250 Schizoaffective disorder, bipolar type: Secondary | ICD-10-CM | POA: Diagnosis not present

## 2020-09-12 DIAGNOSIS — G40209 Localization-related (focal) (partial) symptomatic epilepsy and epileptic syndromes with complex partial seizures, not intractable, without status epilepticus: Secondary | ICD-10-CM | POA: Diagnosis not present

## 2020-09-12 DIAGNOSIS — I10 Essential (primary) hypertension: Secondary | ICD-10-CM | POA: Diagnosis not present

## 2020-09-16 ENCOUNTER — Encounter (HOSPITAL_BASED_OUTPATIENT_CLINIC_OR_DEPARTMENT_OTHER): Payer: Self-pay | Admitting: Emergency Medicine

## 2020-09-16 ENCOUNTER — Other Ambulatory Visit: Payer: Self-pay

## 2020-09-16 ENCOUNTER — Emergency Department (HOSPITAL_BASED_OUTPATIENT_CLINIC_OR_DEPARTMENT_OTHER)
Admission: EM | Admit: 2020-09-16 | Discharge: 2020-09-16 | Disposition: A | Discharge status: Home / Self Care | Payer: Medicare Other | Attending: Emergency Medicine | Admitting: Emergency Medicine

## 2020-09-16 DIAGNOSIS — R29810 Facial weakness: Secondary | ICD-10-CM | POA: Diagnosis not present

## 2020-09-16 DIAGNOSIS — R202 Paresthesia of skin: Secondary | ICD-10-CM | POA: Diagnosis not present

## 2020-09-16 DIAGNOSIS — R531 Weakness: Secondary | ICD-10-CM | POA: Diagnosis not present

## 2020-09-16 DIAGNOSIS — F419 Anxiety disorder, unspecified: Secondary | ICD-10-CM | POA: Diagnosis not present

## 2020-09-16 DIAGNOSIS — R109 Unspecified abdominal pain: Secondary | ICD-10-CM | POA: Diagnosis not present

## 2020-09-16 DIAGNOSIS — I10 Essential (primary) hypertension: Secondary | ICD-10-CM | POA: Insufficient documentation

## 2020-09-16 DIAGNOSIS — E119 Type 2 diabetes mellitus without complications: Secondary | ICD-10-CM | POA: Diagnosis not present

## 2020-09-16 NOTE — ED Notes (Signed)
Pt states she was not aware that cab was from [place] when she said she would get a cab home and that it cost so much , pt requested voucher which was given , Cab called for pt

## 2020-09-16 NOTE — ED Notes (Signed)
When asked about her hx of schizophrenia ... pt states to take that off she adamantly states she wants that off  And that a  [employer] police officer is helping her with an issue with a person that pt feels uncomfortable with

## 2020-09-16 NOTE — Discharge Instructions (Signed)
1.  Follow-up with your family doctor and your neurologist for ongoing evaluation of preseizure sensations. 2.  Follow-up with your family doctor and a psychiatrist or mental health counselor for help with anxiety and panic.

## 2020-09-16 NOTE — ED Triage Notes (Addendum)
Pain/ tingling  in rt upper quad of aBD  Since last night  Felt nauseous she states  , denies dysuria and sob. Had EMS to her apt last night for same  Tingling in abd chest and  Arms  But she refused transport last  Night ,  States she feels like she might have a sz but has no hx states is going to have EEG soon

## 2020-09-16 NOTE — ED Provider Notes (Addendum)
[facility]ERGENC[facility] DEPARTMENT Provider Note   CSN: [id number] Arrival date & time: 09/16/20  0757     History Chief Complaint  Patient presents with  . Abdominal Pain    [name] is a 54 y.o. female.  HPI Patient has multiple concerns today.  She reports she had EMS out to her house last night for feeling of impending seizure but then determined that she was okay and did not need to be seen.  Patient reports she is scheduled to get an EEG in December.  She has been seen by neurology.  Patient is concerned that a bony prominence on her forehead is related to symptoms that will cause a stroke or a seizure.  She reports the area is tender and is extremely concerned that this will be the cause of a serious medical event.  Patient reports she is under extreme stress because she is being cyber stalked and she is finally convinced the place of this.  Still, the event is creating severe stress for her.    Past Medical History:  Diagnosis Date  . Anxiety   . Delusional disorder ([place])   . Diabetes mellitus without complication ([place])   . Homelessness   . Hypertension   . Major depressive disorder, recurrent, severe with psychotic features ([place])   . Overdose   . Paranoid schizophrenia ([place])   . Schizophrenia ([place])   . Tremors of nervous system     Patient Active Problem List   Diagnosis Date Noted  . Paranoid schizophrenia ([place]) 12/26/2019  . Obesity 11/17/2019  . Type 2 diabetes mellitus without complication, without long-term current use of insulin ([place]) 09/21/2019  . Schizoaffective disorder, bipolar type ([place]) 07/21/2019  . MDD (major depressive disorder) 07/13/2019  . Major depressive disorder, recurrent episode, severe, with psychosis ([place])   . Schizophrenia ([place]) 03/26/2018  . Delusional disorder ([place]) 01/07/2018  . Anxiety 03/29/2016  . Homelessness 11/18/2015  . Tobacco use disorder 07/05/2015  . Hypokalemia 05/30/2015  . Obstipation 05/30/2015    . Prolonged QT interval 05/30/2015  . Hypertension 05/30/2015  . Suicidal ideations 03/12/2015  . Diabetes ([place]) 09/06/2012  . Paranoia ([place]) 04/16/2012    History reviewed. No pertinent surgical history.   OB History   No obstetric history on file.     Family History  Problem Relation Age of Onset  . Hypertension Mother   . Diabetes Mother   . Aneurysm Mother   . Hypertension Father   . Diabetes Father   . Hypertension Sister   . Diabetes Sister   . Hypertension Other   . Diabetes Other     Social History   Tobacco Use  . Smoking status: Never Smoker  . Smokeless tobacco: Never Used  Vaping Use  . Vaping Use: Never used  Substance Use Topics  . Alcohol use: No  . Drug use: No    Home Medications Prior to Admission medications   Medication Sig Start Date End Date Taking? Authorizing Provider  acetaminophen (TYLENOL) 325 MG tablet Take 650 mg by mouth every 6 (six) hours as needed for mild pain or headache.    [provider]  benztropine (COGENTIN) 0.5 MG tablet Take 1 tablet (0.5 mg total) by mouth 2 (two) times daily. Patient not taking: Reported on 01/15/2020 12/29/19   Johnn Hai, MD  gabapentin (NEURONTIN) 300 MG capsule TAKE 1 CAPSULE(300 MG) BY MOUTH THREE TIMES DAILY 04/13/20   Cameron Sprang, MD  metoCLOPramide (REGLAN) 10 MG tablet Take  1 tablet (10 mg total) by mouth as needed for nausea. Take 1 tablet as needed for nausea and vomitting Patient not taking: Reported on 12/27/2019 09/21/19   Cameron Sprang, MD  paliperidone (INVEGA SUSTENNA) 156 MG/ML SUSY injection Inject 1 mL (156 mg total) into the muscle once for 1 dose. (Due on 01-05-20): For mood control 01/05/20 01/05/20  Lindell Spar I, NP  Paliperidone ER (INVEGA SUSTENNA) injection Inject 117 mg into the muscle every 28 (twenty-eight) days. (Due on 02-02-20): For mood control 02/02/20   Lindell Spar I, NP  risperiDONE (RISPERDAL) 3 MG tablet Take 2 tablets (6 mg total) by mouth at  bedtime. Patient not taking: Reported on 01/15/2020 12/29/19   Johnn Hai, MD  temazepam (RESTORIL) 30 MG capsule Take 1 capsule (30 mg total) by mouth at bedtime. Patient not taking: Reported on 01/15/2020 12/29/19   Johnn Hai, MD    Allergies    Pork-derived products  Review of Systems   Review of Systems 10 systems reviewed and negative except as per HPI Physical Exam Updated Vital Signs BP 106/68 (BP Location: Right Arm)   Pulse 71   Temp 98.5 F (36.9 C)   Resp 16   Ht 5\' 5"  (1.651 m)   Wt 119.7 kg   LMP 10/29/2011 Comment: NEG U PREG 02/24/17  SpO2 98%   BMI 43.93 kg/m   Physical Exam Constitutional:      Comments: Patient is alert and nontoxic.  She is clinically well in appearance.  Alert and interactive.  HENT:     Head: Normocephalic and atraumatic.     Comments: Patient has concerns for an area on her right forehead that she feels is abnormal in terms of bony prominence.  No significant abnormality present.  Bony structures of forehead are smooth without any boggy areas or step-offs.    Mouth/Throat:     Mouth: Mucous membranes are moist.     Pharynx: Oropharynx is clear.  Eyes:     Extraocular Movements: Extraocular movements intact.     Pupils: Pupils are equal, round, and reactive to light.  Cardiovascular:     Rate and Rhythm: Normal rate and regular rhythm.  Pulmonary:     Effort: Pulmonary effort is normal.     Breath sounds: Normal breath sounds.  Abdominal:     General: There is no distension.     Palpations: Abdomen is soft.     Tenderness: There is no abdominal tenderness. There is no guarding.  Musculoskeletal:        General: No swelling or tenderness. Normal range of motion.     Right lower leg: No edema.     Left lower leg: No edema.  Skin:    General: Skin is warm and dry.  Neurological:     General: No focal deficit present.     Mental Status: She is oriented to person, place, and time.     Cranial Nerves: No cranial nerve deficit.      Motor: No weakness.     Coordination: Coordination normal.     Comments: Patient is alert.  Speech is clear.  Aside from level of paranoia, content is normal and situationally appropriate.  Finger-nose exam intact bilaterally.  Motor strength 5\5 upper and lower extremities.  Sensation intact to light touch x4 extremities.  Psychiatric:     Comments: Patient's mood is collected.  She is interacting appropriately.  Eye contact is good.     ED Results / Procedures / Treatments  Labs (all labs ordered are listed, but only abnormal results are displayed) Labs Reviewed  URINALYSIS, ROUTINE W REFLEX MICROSCOPIC    EKG None  Radiology No results found.  Procedures Procedures (including critical care time)  Medications Ordered in ED Medications - No data to display  ED Course  I have reviewed the triage vital signs and the nursing notes.  Pertinent labs & imaging results that were available during my care of the patient were reviewed by me and considered in my medical decision making (see chart for details).    MDM Rules/Calculators/A&P                          Patient presents for several complaints but chiefly concern is that she will have a stroke or a seizure due to irregularities of her frontal bones or other unspecified cause.  She reports she lives alone and there will be no one to help her.  I tried to reassure the patient she has had normal MRI and neurology consult.  I tried to alleviate concerns regarding stroke being caused by an irregularity in her frontal bones.  We discussed her concerns for a cyber stalker and I advised I think many of her symptoms are coming from anxiety.  I have counseled her to follow-up with her neurologist, family doctor and psychiatry or mental health counselor.  Patient is dismayed that there is no medication or some other modality to help her today.  I have tried reassurance.  At this time no medical indication for further diagnostic imaging or  other appropriate therapeutic treatment at this time.  Patient is mildly delusional but not actively psychotic.  Does not appear to have any risk to herself at this time.  She was adamant to nursing staff that the diagnosis of schizophrenia be removed from her chart.  At this time, that will need to be determined by her mental health professionals. Final Clinical Impression(s) / ED Diagnoses Final diagnoses:  Anxiety    Rx / DC Orders ED Discharge Orders    None       Charlesetta Shanks, MD 09/16/20 [id number]    Charlesetta Shanks, MD 09/16/20 [phone]

## 2020-09-17 DIAGNOSIS — R200 Anesthesia of skin: Secondary | ICD-10-CM | POA: Diagnosis not present

## 2020-09-17 DIAGNOSIS — R202 Paresthesia of skin: Secondary | ICD-10-CM | POA: Diagnosis not present

## 2020-09-17 DIAGNOSIS — R079 Chest pain, unspecified: Secondary | ICD-10-CM | POA: Diagnosis not present

## 2020-09-17 DIAGNOSIS — Z79899 Other long term (current) drug therapy: Secondary | ICD-10-CM | POA: Diagnosis not present

## 2020-09-24 DIAGNOSIS — R9431 Abnormal electrocardiogram [ECG] [EKG]: Secondary | ICD-10-CM | POA: Diagnosis not present

## 2020-09-25 DIAGNOSIS — E119 Type 2 diabetes mellitus without complications: Secondary | ICD-10-CM | POA: Diagnosis not present

## 2020-09-25 DIAGNOSIS — Z9181 History of falling: Secondary | ICD-10-CM | POA: Diagnosis not present

## 2020-09-25 DIAGNOSIS — F419 Anxiety disorder, unspecified: Secondary | ICD-10-CM | POA: Diagnosis not present

## 2020-09-25 DIAGNOSIS — F4521 Hypochondriasis: Secondary | ICD-10-CM | POA: Diagnosis not present

## 2020-09-25 DIAGNOSIS — M797 Fibromyalgia: Secondary | ICD-10-CM | POA: Diagnosis not present

## 2020-09-25 DIAGNOSIS — Z7984 Long term (current) use of oral hypoglycemic drugs: Secondary | ICD-10-CM | POA: Diagnosis not present

## 2020-09-25 DIAGNOSIS — R42 Dizziness and giddiness: Secondary | ICD-10-CM | POA: Diagnosis not present

## 2020-09-25 DIAGNOSIS — E860 Dehydration: Secondary | ICD-10-CM | POA: Diagnosis not present

## 2020-09-25 DIAGNOSIS — Z23 Encounter for immunization: Secondary | ICD-10-CM | POA: Diagnosis not present

## 2020-09-25 DIAGNOSIS — F209 Schizophrenia, unspecified: Secondary | ICD-10-CM | POA: Diagnosis not present

## 2020-10-08 ENCOUNTER — Other Ambulatory Visit: Payer: Self-pay

## 2020-10-08 ENCOUNTER — Emergency Department (HOSPITAL_COMMUNITY)
Admission: EM | Admit: 2020-10-08 | Discharge: 2020-10-08 | Disposition: A | Discharge status: Home / Self Care | Payer: Medicare Other | Attending: Emergency Medicine | Admitting: Emergency Medicine

## 2020-10-08 DIAGNOSIS — I10 Essential (primary) hypertension: Secondary | ICD-10-CM | POA: Diagnosis not present

## 2020-10-08 DIAGNOSIS — R109 Unspecified abdominal pain: Secondary | ICD-10-CM | POA: Diagnosis not present

## 2020-10-08 DIAGNOSIS — Z5321 Procedure and treatment not carried out due to patient leaving prior to being seen by health care provider: Secondary | ICD-10-CM | POA: Diagnosis not present

## 2020-10-08 DIAGNOSIS — R52 Pain, unspecified: Secondary | ICD-10-CM | POA: Diagnosis not present

## 2020-10-08 LAB — CBC
HCT: 38 % (ref 36.0–46.0)
Hemoglobin: 12.2 g/dL (ref 12.0–15.0)
MCH: 26.5 pg (ref 26.0–34.0)
MCHC: 32.1 g/dL (ref 30.0–36.0)
MCV: 82.4 fL (ref 80.0–100.0)
Platelets: 318 10*3/uL (ref 150–400)
RBC: 4.61 MIL/uL (ref 3.87–5.11)
RDW: 13.2 % (ref 11.5–15.5)
WBC: 5.7 10*3/uL (ref 4.0–10.5)
nRBC: 0 % (ref 0.0–0.2)

## 2020-10-08 LAB — I-STAT BETA HCG BLOOD, ED (MC, WL, AP ONLY): I-stat hCG, quantitative: <5 m[IU]/mL (ref <5)

## 2020-10-08 LAB — COMPREHENSIVE METABOLIC PANEL
ALT: 23 U/L (ref 0–44)
AST: 24 U/L (ref 15–41)
Albumin: 3.8 g/dL (ref 3.5–5.0)
Alkaline Phosphatase: 116 U/L (ref 38–126)
Anion gap: 11 (ref 5–15)
BUN: 16 mg/dL (ref 6–20)
CO2: 21 mmol/L — ABNORMAL LOW (ref 22–32)
Calcium: 9 mg/dL (ref 8.9–10.3)
Chloride: 107 mmol/L (ref 98–111)
Creatinine, Ser: 0.86 mg/dL (ref 0.44–1.00)
GFR, Estimated: >60 mL/min (ref >60)
Glucose, Bld: 151 mg/dL — ABNORMAL HIGH (ref 70–99)
Potassium: 3.9 mmol/L (ref 3.5–5.1)
Sodium: 139 mmol/L (ref 135–145)
Total Bilirubin: 0.3 mg/dL (ref 0.3–1.2)
Total Protein: 7.2 g/dL (ref 6.5–8.1)

## 2020-10-08 LAB — LIPASE, BLOOD: Lipase: 33 U/L (ref 11–51)

## 2020-10-08 LAB — SALICYLATE LEVEL: Salicylate Lvl: <7 mg/dL — ABNORMAL LOW (ref 7.0–30.0)

## 2020-10-08 LAB — ETHANOL: Alcohol, Ethyl (B): <10 mg/dL (ref <10)

## 2020-10-08 NOTE — ED Triage Notes (Signed)
Pt here via EMS for eval of abdominal pain. Also hx of schizophrenia and is worried about her intestines coming out and cyberware that people are tracking her with.  Denies n/v. Normal bowel movements.

## 2020-10-08 NOTE — ED Notes (Addendum)
Pt called 3x no response  

## 2020-10-10 DIAGNOSIS — R55 Syncope and collapse: Secondary | ICD-10-CM | POA: Diagnosis not present

## 2020-10-15 DIAGNOSIS — G40209 Localization-related (focal) (partial) symptomatic epilepsy and epileptic syndromes with complex partial seizures, not intractable, without status epilepticus: Secondary | ICD-10-CM | POA: Diagnosis not present

## 2020-10-15 DIAGNOSIS — G43009 Migraine without aura, not intractable, without status migrainosus: Secondary | ICD-10-CM | POA: Diagnosis not present

## 2021-01-15 ENCOUNTER — Ambulatory Visit: Payer: Medicare Other | Admitting: Neurology

## 2021-02-18 ENCOUNTER — Other Ambulatory Visit: Payer: Self-pay | Admitting: Neurology

## 2021-03-23 ENCOUNTER — Emergency Department (HOSPITAL_BASED_OUTPATIENT_CLINIC_OR_DEPARTMENT_OTHER): Payer: Medicare Other

## 2021-03-23 ENCOUNTER — Emergency Department (HOSPITAL_BASED_OUTPATIENT_CLINIC_OR_DEPARTMENT_OTHER)
Admission: EM | Admit: 2021-03-23 | Discharge: 2021-03-23 | Disposition: A | Discharge status: Home / Self Care | Payer: Medicare Other | Attending: Emergency Medicine | Admitting: Emergency Medicine

## 2021-03-23 ENCOUNTER — Encounter (HOSPITAL_BASED_OUTPATIENT_CLINIC_OR_DEPARTMENT_OTHER): Payer: Self-pay | Admitting: Emergency Medicine

## 2021-03-23 ENCOUNTER — Other Ambulatory Visit: Payer: Self-pay

## 2021-03-23 DIAGNOSIS — R519 Headache, unspecified: Secondary | ICD-10-CM | POA: Diagnosis present

## 2021-03-23 DIAGNOSIS — I10 Essential (primary) hypertension: Secondary | ICD-10-CM | POA: Diagnosis not present

## 2021-03-23 DIAGNOSIS — E119 Type 2 diabetes mellitus without complications: Secondary | ICD-10-CM | POA: Insufficient documentation

## 2021-03-23 MED ORDER — ACETAMINOPHEN 500 MG PO TABS
1000.0000 mg | ORAL_TABLET | Freq: Once | ORAL | Status: AC
  Administered 2021-03-23: 1000 mg via ORAL
  Filled 2021-03-23: qty 2

## 2021-03-23 NOTE — ED Notes (Signed)
ED Provider at bedside. 

## 2021-03-23 NOTE — Discharge Instructions (Addendum)
It was our pleasure to provide your ER care today - we hope that you feel better.  Take acetaminophen or ibuprofen as need.   Follow up with primary care doctor this coming week if symptoms fail to improve/resolve.  Return to ER if worse, new symptoms, fevers, new or severe pain, seizures, persistent vomiting, trouble breathing, or other concern.

## 2021-03-23 NOTE — ED Notes (Signed)
Patient transported to CT 

## 2021-03-23 NOTE — ED Triage Notes (Signed)
Brought by ems for c/o headache and feeling like her insides are convulsing.  Reported to ems that she was in a lawsuit and was being cyber bullied.

## 2021-03-23 NOTE — ED Notes (Signed)
Given [place]  

## 2021-03-23 NOTE — ED Provider Notes (Signed)
[facility] EMERGENCY DEPARTMENT Provider Note   CSN: [id number] Arrival date & time: 03/23/21  1749     History Chief Complaint  Patient presents with  . Headache    [name] is a 55 y.o. female.  Patient c/o dull headache. Symptoms acute onset today, moderate, constant, dull, non radiating. Patient indicates why lying on couch transiently felt as if entire body was convulsing on the inside. That lasted several seconds. Was awake and alert during episode. Did not fall from couch or hit head. No confusion. Afterwards noted dull frontal headache. Denies hx frequent or migraine headaches. No eye pain or change in vision. No neck pain or stiffness. No associated nv, problems w speech or vision, numbness/weakness, or change in normal functional ability. Pt states two prior eegs were normal. No incontinence. No oral injury. No fever or chills.   The history is provided by the patient and the EMS personnel.  Headache Associated symptoms: no abdominal pain, no back pain, no congestion, no cough, no eye pain, no fever, no nausea, no neck pain, no neck stiffness, no numbness, no sinus pressure, no sore throat, no vomiting and no weakness        Past Medical History:  Diagnosis Date  . Anxiety   . Delusional disorder ([place])   . Diabetes mellitus without complication ([place])   . Homelessness   . Hypertension   . Major depressive disorder, recurrent, severe with psychotic features ([place])   . Overdose   . Paranoid schizophrenia ([place])   . Schizophrenia ([place])   . Tremors of nervous system     Patient Active Problem List   Diagnosis Date Noted  . Paranoid schizophrenia ([place]) 12/26/2019  . Obesity 11/17/2019  . Type 2 diabetes mellitus without complication, without long-term current use of insulin ([place]) 09/21/2019  . Schizoaffective disorder, bipolar type ([place]) 07/21/2019  . MDD (major depressive disorder) 07/13/2019  . Major depressive disorder, recurrent episode, severe,  with psychosis ([place])   . Schizophrenia ([place]) 03/26/2018  . Delusional disorder ([place]) 01/07/2018  . Anxiety 03/29/2016  . Homelessness 11/18/2015  . Tobacco use disorder 07/05/2015  . Hypokalemia 05/30/2015  . Obstipation 05/30/2015  . Prolonged QT interval 05/30/2015  . Hypertension 05/30/2015  . Suicidal ideations 03/12/2015  . Diabetes ([place]) 09/06/2012  . Paranoia ([place]) 04/16/2012    History reviewed. No pertinent surgical history.   OB History   No obstetric history on file.     Family History  Problem Relation Age of Onset  . Hypertension Mother   . Diabetes Mother   . Aneurysm Mother   . Hypertension Father   . Diabetes Father   . Hypertension Sister   . Diabetes Sister   . Hypertension Other   . Diabetes Other     Social History   Tobacco Use  . Smoking status: Never Smoker  . Smokeless tobacco: Never Used  Vaping Use  . Vaping Use: Never used  Substance Use Topics  . Alcohol use: No  . Drug use: No    Home Medications Prior to Admission medications   Medication Sig Start Date End Date Taking? Authorizing Provider  acetaminophen (TYLENOL) 325 MG tablet Take 650 mg by mouth every 6 (six) hours as needed for mild pain or headache.    [provider]  benztropine (COGENTIN) 0.5 MG tablet Take 1 tablet (0.5 mg total) by mouth 2 (two) times daily. Patient not taking: Reported on 01/15/2020 12/29/19   Johnn Hai, MD  gabapentin (NEURONTIN) 300  MG capsule TAKE 1 CAPSULE(300 MG) BY MOUTH THREE TIMES DAILY 04/13/20   Cameron Sprang, MD  metoCLOPramide (REGLAN) 10 MG tablet Take 1 tablet (10 mg total) by mouth as needed for nausea. Take 1 tablet as needed for nausea and vomitting Patient not taking: Reported on 12/27/2019 09/21/19   Cameron Sprang, MD  paliperidone (INVEGA SUSTENNA) 156 MG/ML SUSY injection Inject 1 mL (156 mg total) into the muscle once for 1 dose. (Due on 01-05-20): For mood control 01/05/20 01/05/20  Lindell Spar I, NP  Paliperidone ER  (INVEGA SUSTENNA) injection Inject 117 mg into the muscle every 28 (twenty-eight) days. (Due on 02-02-20): For mood control 02/02/20   Lindell Spar I, NP  risperiDONE (RISPERDAL) 3 MG tablet Take 2 tablets (6 mg total) by mouth at bedtime. Patient not taking: Reported on 01/15/2020 12/29/19   Johnn Hai, MD  temazepam (RESTORIL) 30 MG capsule Take 1 capsule (30 mg total) by mouth at bedtime. Patient not taking: Reported on 01/15/2020 12/29/19   Johnn Hai, MD    Allergies    Pork-derived products  Review of Systems   Review of Systems  Constitutional: Negative for chills and fever.  HENT: Negative for congestion, rhinorrhea, sinus pressure and sore throat.   Eyes: Negative for pain, redness and visual disturbance.  Respiratory: Negative for cough and shortness of breath.   Cardiovascular: Negative for chest pain and palpitations.  Gastrointestinal: Negative for abdominal pain, nausea and vomiting.  Genitourinary: Negative for dysuria and flank pain.  Musculoskeletal: Negative for back pain, neck pain and neck stiffness.  Skin: Negative for rash.  Neurological: Positive for headaches. Negative for syncope, speech difficulty, weakness and numbness.  Hematological: Does not bruise/bleed easily.  Psychiatric/Behavioral: Negative for confusion.    Physical Exam Updated Vital Signs BP 134/90 (BP Location: Right Arm)   Pulse 87   Temp 98.6 F (37 C) (Oral)   Resp 18   Ht 1.664 m (5' 5.5")   Wt 119.1 kg   LMP 10/29/2011 Comment: NEG U PREG 02/24/17  SpO2 100%   BMI 43.02 kg/m   Physical Exam Vitals and nursing note reviewed.  Constitutional:      General: She is not in acute distress.    Appearance: Normal appearance. She is well-developed. She is not diaphoretic.  HENT:     Head: Atraumatic.     Comments: No sinus or temporal tenderness.     Right Ear: Tympanic membrane normal.     Left Ear: Tympanic membrane normal.     Nose: Nose normal.     Mouth/Throat:     Mouth: Mucous  membranes are moist.     Pharynx: Oropharynx is clear. No oropharyngeal exudate or posterior oropharyngeal erythema.     Comments: No oral/tongue trauma.  Eyes:     General: No scleral icterus.    Conjunctiva/sclera: Conjunctivae normal.     Pupils: Pupils are equal, round, and reactive to light.  Neck:     Thyroid: No thyromegaly.     Trachea: No tracheal deviation.     Comments: No stiffness or rigidity.  Cardiovascular:     Rate and Rhythm: Normal rate and regular rhythm.     Pulses: Normal pulses.     Heart sounds: Normal heart sounds. No murmur heard. No friction rub. No gallop.   Pulmonary:     Effort: Pulmonary effort is normal. No respiratory distress.     Breath sounds: Normal breath sounds.  Abdominal:     General: There  is no distension.     Palpations: Abdomen is soft.     Tenderness: There is no abdominal tenderness.  Genitourinary:    Comments: No cva tenderness.  Musculoskeletal:        General: No swelling or tenderness. Normal range of motion.     Cervical back: Normal range of motion and neck supple. No rigidity. No muscular tenderness.     Right lower leg: No edema.     Left lower leg: No edema.     Comments: C/T spine non tender, aligned.   Skin:    General: Skin is warm and dry.     Findings: No rash.  Neurological:     Mental Status: She is alert.     Cranial Nerves: No cranial nerve deficit.     Comments: Alert, speech normal, no dysarthria or aphasia. Oriented.  Motor/sens grossly intact bil. Steady gait.   Psychiatric:        Mood and Affect: Mood normal.     ED Results / Procedures / Treatments   Labs (all labs ordered are listed, but only abnormal results are displayed) Labs Reviewed - No data to display  EKG None  Radiology CT HEAD WO CONTRAST  Result Date: 03/23/2021 CLINICAL DATA:  Headaches and seizure activity EXAM: CT HEAD WITHOUT CONTRAST TECHNIQUE: Contiguous axial images were obtained from the base of the skull through the  vertex without intravenous contrast. COMPARISON:  05/16/2020 FINDINGS: Brain: No evidence of acute infarction, hemorrhage, hydrocephalus, extra-axial collection or mass lesion/mass effect. Artifact is identified in the upper slices particularly image number 19 of series 2 which suggests subdural blood although reconstructed images show this to be related to motion artifact Vascular: No hyperdense vessel or unexpected calcification. Skull: Normal. Negative for fracture or focal lesion. Sinuses/Orbits: No acute finding. Other: None. IMPRESSION: Considerable motion artifact which limits the examination somewhat. No acute abnormality is noted. Electronically Signed   By: Inez Catalina M.D.   On: 03/23/2021 18:45    Procedures Procedures   Medications Ordered in ED Medications - No data to display  ED Course  I have reviewed the triage vital signs and the nursing notes.  Pertinent labs & imaging results that were available during my care of the patient were reviewed by me and considered in my medical decision making (see chart for details).    MDM Rules/Calculators/A&P                          Imaging ordered.  Reviewed nursing notes and prior charts for additional history.   No meds pta or today for headache. Acetaminophen po. Po fluids.   CT reviewed/interpreted by me - no hem.  Pt appears stable for d/c.   Rec pcp f/u.  Return precautions provided.    Final Clinical Impression(s) / ED Diagnoses Final diagnoses:  None    Rx / DC Orders ED Discharge Orders    None       Lajean Saver, MD 03/23/21 1851

## 2021-04-05 ENCOUNTER — Encounter (HOSPITAL_BASED_OUTPATIENT_CLINIC_OR_DEPARTMENT_OTHER): Payer: Self-pay | Admitting: Emergency Medicine

## 2021-04-05 ENCOUNTER — Emergency Department (HOSPITAL_BASED_OUTPATIENT_CLINIC_OR_DEPARTMENT_OTHER)
Admission: EM | Admit: 2021-04-05 | Discharge: 2021-04-05 | Disposition: A | Discharge status: Home / Self Care | Payer: Medicare Other | Attending: Emergency Medicine | Admitting: Emergency Medicine

## 2021-04-05 ENCOUNTER — Other Ambulatory Visit: Payer: Self-pay

## 2021-04-05 DIAGNOSIS — R197 Diarrhea, unspecified: Secondary | ICD-10-CM | POA: Diagnosis not present

## 2021-04-05 DIAGNOSIS — E119 Type 2 diabetes mellitus without complications: Secondary | ICD-10-CM | POA: Insufficient documentation

## 2021-04-05 DIAGNOSIS — N938 Other specified abnormal uterine and vaginal bleeding: Secondary | ICD-10-CM | POA: Insufficient documentation

## 2021-04-05 DIAGNOSIS — R10817 Generalized abdominal tenderness: Secondary | ICD-10-CM | POA: Diagnosis not present

## 2021-04-05 DIAGNOSIS — I10 Essential (primary) hypertension: Secondary | ICD-10-CM | POA: Diagnosis not present

## 2021-04-05 MED ORDER — SODIUM CHLORIDE 0.9 % IV BOLUS: 1000.0000 mL | Freq: Once | INTRAVENOUS | Status: DC

## 2021-04-05 NOTE — ED Triage Notes (Signed)
Pt brought in by EMS for diarrhea that looks like "oatmeal" x 24 hrs. Pt states that today she had a BM that smelt like blood but did not have any blood in it. Pt also c/o vaginal discharge that is dark mixed with some [place]. Pt states "it could be the soap that i'm using also." Pt denies any sexual activity at this time. EMS stated that pt was able to ambulated from second story apt down to EMS rig unassisted and with steady gait. Pt aaox3, VSS, GCS 15, NAD noted upon triage.

## 2021-04-05 NOTE — ED Provider Notes (Signed)
[facility] EMERGENCY DEPARTMENT Provider Note   CSN: [id number] Arrival date & time: 04/05/21  1929     History Chief Complaint  Patient presents with   Vaginal Discharge    [name] is a 55 y.o. female.  The history is provided by the patient, the EMS personnel and medical records.  Vaginal Discharge [name] is a 55 y.o. female who presents to the Emergency Department complaining of multiple complaints.  Thought she smelled blood today - wasn't sure if it was vaginal or a nose bleed. She then developed lower abdominal discomfort. She wiped her vaginally area and noted there was dark material in the sink after wiping. She also reports having diarrhea starting today that she describes as oatmeal and appearance. She had two episodes of diarrhea. She has associated nausea, no dysuria. She does not have a menstrual cycle and is not sexually active.  Does not have a menstrual cycle. Not currently sexually active.    Takes gabapentin and nausea medication at home.  Has metformin at home but does not regularly take it.     Past Medical History:  Diagnosis Date   Anxiety    Delusional disorder ([place])    Diabetes mellitus without complication ([place])    Homelessness    Hypertension    Major depressive disorder, recurrent, severe with psychotic features ([place])    Overdose    Paranoid schizophrenia ([place])    Schizophrenia ([place])    Tremors of nervous system     Patient Active Problem List   Diagnosis Date Noted   Paranoid schizophrenia ([place]) 12/26/2019   Obesity 11/17/2019   Type 2 diabetes mellitus without complication, without long-term current use of insulin ([place]) 09/21/2019   Schizoaffective disorder, bipolar type ([place]) 07/21/2019   MDD (major depressive disorder) 07/13/2019   Major depressive disorder, recurrent episode, severe, with psychosis ([place])    Schizophrenia ([place]) 03/26/2018   Delusional disorder ([place]) 01/07/2018   Anxiety 03/29/2016   Homelessness  11/18/2015   Tobacco use disorder 07/05/2015   Hypokalemia 05/30/2015   Obstipation 05/30/2015   Prolonged QT interval 05/30/2015   Hypertension 05/30/2015   Suicidal ideations 03/12/2015   Diabetes ([place]) 09/06/2012   Paranoia ([place]) 04/16/2012    History reviewed. No pertinent surgical history.   OB History   No obstetric history on file.     Family History  Problem Relation Age of Onset   Hypertension Mother    Diabetes Mother    Aneurysm Mother    Hypertension Father    Diabetes Father    Hypertension Sister    Diabetes Sister    Hypertension Other    Diabetes Other     Social History   Tobacco Use   Smoking status: Never   Smokeless tobacco: Never  Vaping Use   Vaping Use: Never used  Substance Use Topics   Alcohol use: No   Drug use: No    Home Medications Prior to Admission medications   Medication Sig Start Date End Date Taking? Authorizing Provider  acetaminophen (TYLENOL) 325 MG tablet Take 650 mg by mouth every 6 (six) hours as needed for mild pain or headache.    [provider]  benztropine (COGENTIN) 0.5 MG tablet Take 1 tablet (0.5 mg total) by mouth 2 (two) times daily. Patient not taking: Reported on 01/15/2020 12/29/19   Johnn Hai, MD  gabapentin (NEURONTIN) 300 MG capsule TAKE 1 CAPSULE(300 MG) BY MOUTH THREE TIMES DAILY 04/13/20   Cameron Sprang, MD  metoCLOPramide (REGLAN) 10 MG tablet Take 1 tablet (10 mg total) by mouth as needed for nausea. Take 1 tablet as needed for nausea and vomitting Patient not taking: Reported on 12/27/2019 09/21/19   Cameron Sprang, MD  paliperidone (INVEGA SUSTENNA) 156 MG/ML SUSY injection Inject 1 mL (156 mg total) into the muscle once for 1 dose. (Due on 01-05-20): For mood control 01/05/20 01/05/20  Lindell Spar I, NP  Paliperidone ER (INVEGA SUSTENNA) injection Inject 117 mg into the muscle every 28 (twenty-eight) days. (Due on 02-02-20): For mood control 02/02/20   Lindell Spar I, NP  risperiDONE  (RISPERDAL) 3 MG tablet Take 2 tablets (6 mg total) by mouth at bedtime. Patient not taking: Reported on 01/15/2020 12/29/19   Johnn Hai, MD  temazepam (RESTORIL) 30 MG capsule Take 1 capsule (30 mg total) by mouth at bedtime. Patient not taking: Reported on 01/15/2020 12/29/19   Johnn Hai, MD    Allergies    Pork-derived products  Review of Systems   Review of Systems  Genitourinary:  Positive for vaginal discharge.  All other systems reviewed and are negative.  Physical Exam Updated Vital Signs BP 94/66 (BP Location: Right Arm)   Pulse 63   Temp 98.1 F (36.7 C) (Oral)   Resp 18   Ht 5' 5.5" (1.664 m)   Wt 119 kg   LMP 10/29/2011 Comment: NEG U PREG 02/24/17  SpO2 100%   BMI 42.99 kg/m   Physical Exam Vitals and nursing note reviewed.  Constitutional:      Appearance: She is well-developed.  HENT:     Head: Normocephalic and atraumatic.  Cardiovascular:     Rate and Rhythm: Normal rate and regular rhythm.  Pulmonary:     Effort: Pulmonary effort is normal. No respiratory distress.  Abdominal:     Palpations: Abdomen is soft.     Tenderness: There is no guarding or rebound.     Comments: Mild generalized abdominal tenderness  Musculoskeletal:        General: No tenderness.  Skin:    General: Skin is warm and dry.  Neurological:     Mental Status: She is alert and oriented to person, place, and time.  Psychiatric:     Comments: tangential    ED Results / Procedures / Treatments   Labs (all labs ordered are listed, but only abnormal results are displayed) Labs Reviewed  COMPREHENSIVE METABOLIC PANEL  URINALYSIS, ROUTINE W REFLEX MICROSCOPIC  CBC WITH DIFFERENTIAL/PLATELET  LIPASE, BLOOD  HCG, SERUM, QUALITATIVE    EKG None  Radiology No results found.  Procedures Procedures   Medications Ordered in ED Medications  sodium chloride 0.9 % bolus 1,000 mL (has no administration in time range)    ED Course  I have reviewed the triage vital signs  and the nursing notes.  Pertinent labs & imaging results that were available during my care of the patient were reviewed by me and considered in my medical decision making (see chart for details).    MDM Rules/Calculators/A&P                         Patient presents the emergency department by EMS complaining of abdominal discomfort, possible vaginal bleeding and diarrhea. She is non-toxic appearing on evaluation with mild abdominal tenderness. Discussed with patient recommendation to obtain labs to further evaluate her complaints. On reassessment patient declines lab draw and does not want further workup at this time. Discussed importance of PCP  follow-up for further evaluation as well as return precautions.   Final Clinical Impression(s) / ED Diagnoses Final diagnoses:  Diarrhea, unspecified type    Rx / DC Orders ED Discharge Orders     None        Quintella Reichert, MD 04/05/21 2300

## 2021-04-05 NOTE — ED Notes (Signed)
Nurse entered to attempt IV and get blood on pt. Pt stated "I want the IV in my hand." nurse updated pt that due to her symptoms that nurse needed to look for an IV above the wrist since a CT with contrast might be needed. Nurse updated that she would look all over prior to attempting any IVs. Nurse started looking on right arm feeling in [facility] and then on hand. Nurse stated she would then move to the left arm too look. After looking at left arm, nurse set up all IV equipment to attempt and IV. Pt then states "Timoth Schara, I are there any female nurses because I want them to stick me." nurse asked the pt did she do something wrong. Pt stated "I just want a female nurse to stick me, I've been through so much already." nurse stated that there was a female nurse on and that she would go get them. Pt then stated "I need them to come quickly, I smell blood again." pt updated that nurse did not know how long it would take due to there only being one female nurse on the floor.   Pt then got up from the stretcher and ambulated to chair in room to await female nurse.

## 2021-04-05 NOTE — ED Notes (Signed)
Attempted to start IV when patient demanded to be seen exclusively by female staff. Explained to patient that she had an assigned nurse and that would not be possible. Patient became verbally aggressive with this nurse. Charge and provider informed.

## 2021-04-05 NOTE — ED Notes (Addendum)
Patient chose to leave after being seen by provider. Patient refused exit vitals. Patient discharged at this time.

## 2021-04-23 NOTE — ED Notes (Signed)
Formatting of this note might be different from the original.  [facility] access consult called  Electronically signed by Montel Culver, EMT at 04/23/2021  1:25 AM EDT

## 2021-04-23 NOTE — ED Notes (Signed)
Formatting of this note might be different from the original.  Pt changed into paper scrubs with the assistance of public safety and ED tech. b  Personal belongings placed in belongings bag and locked in #14 slot in C locker.   Electronically signed by Marianna Payment, BS at 04/23/2021  4:41 AM EDT

## 2021-04-23 NOTE — Progress Notes (Signed)
Formatting of this note might be different from the original.  BH assessment completed by Margreta Journey, [facility] with recommendation for IP level of care.  Referral faxed to the following [facility] IP units for review and consideration:    [facility]  [facility]  [facility]  [facility]    Billey Chang, [place]  04/23/2021 / 2:37 PM    .      Electronically signed by Billey Chang, LCSW at 04/23/2021  2:38 PM EDT

## 2021-04-23 NOTE — ED Notes (Signed)
Formatting of this note might be different from the original.  IVC paperwork faxed to [facility] signed by Montel Culver, EMT at 04/23/2021  3:12 AM EDT

## 2021-04-23 NOTE — Progress Notes (Signed)
Formatting of this note is different from the original.  [facility]        Pt will be transported to [facility] after 04/24/21.   COVID has been collected and pt under IVC.  No further needs at this time.    Electronically signed:  Alma Downs, [facility]  04/23/2021 / 7:01 PM      Electronically signed by Alma Downs, [facility] at 04/23/2021  7:02 PM EDT

## 2021-04-23 NOTE — ED Notes (Signed)
Formatting of this note might be different from the original.  Pt receiving BH consult at this time  Electronically signed by Montel Culver, EMT at 04/23/2021  1:39 AM EDT
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
## 2021-04-23 NOTE — ED Provider Notes (Signed)
Formatting of this note is different from the original.    [facility]    ED Provider Note    Ruth Carroll 55 y.o. female DOB: 06/17/1966 MRN: [id number]  History     Chief Complaint   Patient presents with   ? Behavioral Problem     EMS was called for patient because she was [place] the [place] [address] in her [place] with the lights off asleep. states she has been having convulsions and needs another gabapentin. denies any drug use. denies si/hi. pt is alert and oriented, not drowsy in triage.      55 year old female with history diabetes mellitus, hypertension, seizure disorder, schizophrenia, MDD, delusional disorder, presents the emergency department for behavioral health evaluation.    Patient believes that her bones protruding from her forehead.  She is fixated the abnormal bone structure of her skull.  She states it is causing her to have a headache.  She states she is actively prohibiting herself from convulsing, as she is shaking on the inside.    History is limited due to patient's disorganized thoughts and tangential speech.    History reviewed. No pertinent past medical history.    History reviewed. No pertinent surgical history.    Social History     Substance and Sexual Activity   Alcohol Use None     Social History     Tobacco Use   Smoking Status Not on file   Smokeless Tobacco Not on file     E-Cigarettes   ? Vaping Use     ? Start Date     ? Cartridges/Day     ? Quit Date       Social History     Substance and Sexual Activity   Drug Use Not on file       Immunizations Up to Date?: Yes    Allergies   Allergen Reactions   ? Pork-Derived Products Nausea Only     Home Medications    ATORVASTATIN CALCIUM (LIPITOR) 10 MG TABLET    Take 10 mg by mouth.    GABAPENTIN (NEURONTIN) 300 MG CAPSULE    Take 300 mg by mouth 3 (three) times a day.    MELATONIN 10 MG TABS    Take 10 mg by mouth.    METFORMIN (GLUCOPHAGE) 500 MG TABLET    Take 500 mg by mouth 1 (one) time each day with  breakfast.     Review of Systems     Review of Systems   Constitutional: Negative for chills and fever.   HENT: Negative for congestion and rhinorrhea.    Eyes: Negative for photophobia and visual disturbance.   Respiratory: Negative for cough and shortness of breath.    Cardiovascular: Negative for chest pain and palpitations.   Gastrointestinal: Negative for vomiting.   Endocrine: Negative for polydipsia and polyuria.   Genitourinary: Negative for difficulty urinating and dysuria.   Musculoskeletal: Negative for arthralgias and myalgias.   Skin: Negative for rash and wound.   Neurological: Positive for headaches. Negative for weakness.   Psychiatric/Behavioral: Negative for self-injury and suicidal ideas.     Physical Exam     ED Triage Vitals [04/22/21 2215]   BP 141/84   Heart Rate 81   Resp 14   SpO2 99 %   Temp 97.5 F (36.4 C)     Physical Exam   Vitals reviewed.  Constitutional: She appears well-developed. She no respiratory distress.   HENT:  Head: Normocephalic and atraumatic.   Mouth/Throat: Voice normal.   Eyes: EOM are intact. Pupils are equal, round, and reactive to light. Right eye: no drainage. no conjunctival injection. Left eye: no drainage. no conjunctival injection.   Neck: Voice normal. Neck supple.   Cardiovascular: Normal rate and intact distal pulses.   Pulmonary/Chest: No respiratory distress. Good air movement. Respiratory effort normal. No wheezing. No rhonchi. No rales.  Abdominal: Soft. There is no abdominal tenderness. There is no guarding and no rebound. Abdomen not distended.   Musculoskeletal: No obvious deformity noted to extremities.      Cervical back: Neck supple.     Neurological: She is alert and oriented to person, place, and time. Cranial nerves intact II through XII. Finger to nose intact bilaterally.   CN 2 - pupils round, equal, reactive to light  CN 3, 4, 6 - extra-ocular movements intact and no nystagmus  CN 5 - facial sensation intact  CN 7 - no facial droop, raises  eyebrows, symmetric smile  CN 8 - intact hearing  CN 9, 10 - uvula midline, no deviation  CN 11 - good shoulder shrug  CN 12 - tongue midline    Skin: Skin is warm. Skin is dry.   Psychiatric:   Tangential speech, disorganized thoughts     ED Course     Lab results:    CBC AND DIFFERENTIAL - Abnormal       Result Value    WBC 7.1      RBC 4.94 (*)     HGB 12.4      HCT 40.0      MCV 81 (*)     MCH 25.1 (*)     MCHC 31.0      Plt Ct 291      RDW SD 41.7      MPV 10.9      NRBC% 0.0      NRBC 0.000      NEUTROPHIL % 55.2      LYMPHOCYTE % 36.6      MONOCYTE % 4.2      Eosinophil % 3.1      BASOPHIL % 0.6      IG% 0.300      ABSOLUTE NEUTROPHIL COUNT 3.95      ABSOLUTE LYMPHOCYTE COUNT 2.6      MONO ABSOLUTE 0.3      EOS ABSOLUTE 0.2      BASO ABSOLUTE 0.0      IG ABSOLUTE 0.020     SALICYLATE LEVEL - Abnormal    Salicylate <3.0 (*)    ACETAMINOPHEN LEVEL - Abnormal    Acetaminophen <5.0 (*)    ETHANOL - Normal    Ethanol <10      Comment: Blood Alcohol Level is for Medical Purposes Only.   URINE DRUGS OF ABUSE SCRN - Normal    Ur PH DOA Scr 5.5      Amphet Scr Negative      Barb Scr Negative      Benzo Scr Negative      Cannab Scr Negative      Cocaine Scr Negative      Opiates Scr Negative      Meth Scr Negative      Oxyco Scr Negative      Narrative:     Please Note Detection Levels Below:     Amphetamines  1000 ng/mL    Barbiturates                    200 ng/mL    Benzodiazepines                 200 ng/mL    Cannabinoids (Marijuana, THC)   50 ng/mL    Cocaine                         300 ng/mL    Opiates                         300 ng/mL    Methadone                       300 ng/mL    Oxycodone                       100 ng/mL    This test is a screening test and results are only to be used for medical purposes.  If confirmation of positive results are needed, please order confirmation by GC/MS for each drug that needs confirmation.  Urine specimens are retained for 5 days.    TSH - Normal    TSH  4.08     FREE T4 - Normal    Free T4 1.00     UA NEGATIVE POPULATION, NEG SYMPTOMS - Normal    Urine Color Yellow      Urine Appearance Clear      Urine Specific Gravity 1.016      Urine pH 5.5      Urine Protein - Dipstick Negative      Urine Glucose Negative      Urine Ketones Negative      Urine Bilirubin Negative      Urine Blood Negative      Urine Nitrite Negative      Urine Urobilinogen <2       Urine Leukocyte Esterase Negative     COMPREHENSIVE METABOLIC PANEL    Na 137      Potassium 4.1      Cl 103      CO2 21      AGAP 13      Glucose 97      BUN 14      Creatinine 0.84      Ca 8.9      ALK PHOS 135      T Bili 0.20      Total Protein 7.4      Alb 4.3      GLOBULIN 3.1      ALBUMIN/GLOBULIN RATIO 1.4      BUN/CREAT RATIO 16.7      ALT 22      AST 21      eGFR 83      Comment: Normal GFR (glomerular filtration rate) > 60 mL/min/1.73 meters squared, < 60 may include impaired kidney function.  Calculation based on the Chronic Kidney Disease Epidemiology Collaboration (CK-EPI)equation refit without adjustment for race.   LIGHT BLUE TOP   LAVENDER TOP   MINT GREEN-TOP TUBE (PST GEL/LI HEP)   GOLD SST     Imaging:  No data to display    ECG:  ECG Results       ECG 12 lead (In process)  Result time 04/23/21 04:08:06    In process  Narrative:    Diagnosis Class Abnormal  Acquisition Device D3K  Ventricular Rate 56  Atrial Rate 56  P-R Interval 192  QRS Duration 80  Q-T Interval 452  QTC Calculation(Bazett) 436  Calculated P Axis 46  Calculated R Axis 51  Calculated T Axis 27    Diagnosis Sinus bradycardia  Low voltage QRS  Nonspecific T wave abnormality  Abnormal ECG  No previous ECGs available                                       Pre-Sedation  Procedures    ED Course as of 04/23/21 0422   Juleen ChinaHeather D Wagner's Documentation   Tue Apr 23, 2021   0330 Urinalysis with Possible Microscopy  Nonconcerning for infection   0330 Urine Drug Screen  Unremarkable   0330 Free T4  Within normal limits   0330  TSH  Within normal limits   0330 Ethanol  Unremarkable   0330 APAP(!)  Unremarkable   0330 ASA(!)  Unremarkable   0330 Comprehensive Metabolic Panel  Unremarkable   0330 CBC And Differential(!)  Grossly unremarkable   0330 Patient cleared for behavioral health evaluation.   0422 Called to bedside by RN, Kriste BasqueBecky.  Patient is very upset, stating she feels that her bone structure was not adequately evaluated.  I palpated patient's forehead and skull at her request.  Given her concern for her bone structure, I offered a CT of her head.  Patient is agreeable to a CT head.     MDM  Number of Diagnoses or Management Options  Diagnosis management comments: Differential diagnosis includes but not limited to be disorder, substance-induced mood disorder, medication noncompliance.      Amount and/or Complexity of Data Reviewed  Clinical lab tests: ordered and reviewed    Coding    Provider Communication    New Prescriptions    No medications on file     Modified Medications    No medications on file     Discontinued Medications    No medications on file     Clinical Impression     Final diagnoses:   Psychosis, unspecified psychosis type (*)     ED Disposition     ED Disposition   Behavioral Health     Condition   --    Comment   --                 Electronically signed by:    Yehuda BuddHeather D Wagner, DO  04/23/21 0331      Yehuda BuddHeather D Wagner, DO  04/23/21 0422    Electronically signed by Yehuda BuddHeather D Wagner, DO at 04/23/2021  4:22 AM EDT

## 2021-04-23 NOTE — Progress Notes (Signed)
Formatting of this note is different from the original.  [facility]  [facility] - Tele-Behavioral Health Diagnostic Evaluation  Visit was conducted with the use of interactive audio and video telecommunications systems that permits real time communication between provider and patient    Patient location at time of Tele-Consult: [facility]  Provider Location at time of Tele-Consult: [facility]  Patient Name:  Ruth Carroll  Date of Birth:  [birth year]   Today's Date:  April 23, 2021    36 minutes spent related to assessment and crisis stabilization.    Today's encounter included additional complexity due to: none  Provisional Diagnosis   Provisional Diagnosis: F29 Unspecified Psychotic D/O  Primary Presenting Problem: Mental Health  LOCUS Scores: 2, 4, 2, 3, 4, 4, 5 = 24  Behavioral Health Acuity: Level 2    Formulation: Tyaisha Cullom is a 55 y.o. female with a history of schizoaffective d/o, bipolar type, MDD, Delusional D/O, and Anxiety who presented to the Patient location at time of Tele-Consult: [facility] ED via EMS voluntarily with report of behavioral problem.  UDS is positive for nothing - UDS negative; EtOH <10.  On interview the patient is cooperative; however, disorganized and confused.  Observed behaviors include sitting in hospital chair, appears fatigued and distressed.  Risk factors include being found in car in middle of street asleep.  Current stressors include strained social relationships.      Patient reports, ?I was having convulsions. I started having them in my car. I had to call the paramedics or someone called them for me. I have a major headache right now.? Patient reports recently starting to have ?convulsions,? in which she ?starts shaking on the inside and I should be passed out. But I know I can feel them coming on. And I felt one coming on so I thought I called for the paramedics but maybe  someone else did.? Patient then reports neurological damages in her eyes. Patient reports ?I have bone structures sticking out of my forehead.? Patient reports Rx Gabapentin 100mg  TID ?but I need a stronger medication.? She reports having a double EEG sch?d soon on the ?29th and 30th.? Patient reports, ?I filed harassment against someone and he married someone in my church and now they?re all against me. The black community is now against me.? Patient discusses cyber bullying; ?but it?s the bone structure.? Patient speech is scattered and disorganized. Patient then discusses meals she eats regularly without being questioned about this. She reports needing to stay restful ?inside of me.? Patient reports needing ?internal rest.? Patient denies SI/HI/psychosis; however, presents with confusion and disorganization with repetitive speech; obsessive delusions and questionable paranoia. She presents as internally preoccupied. She continues to report ?stinging pain? in her head. Patient reports, ?they messed me up pretty bad; the guy that I filed harassment against; physically inside.? Patient denies abuse when questioned. Patient denies Rx medications other then gabapentin. Patient reports being ?sick to my stomach. I need to throw up. I?m just physically sick.? Patient reports possible OPT at [facility]; ?I think they?ve been calling me. I don?t know.? Patient reports living alone currently; history of homelessness. Patient reports ?there?s noises in the walls; a knocking constantly? that she discusses in regard to living environment. Patient remains severely confused and disorganized throughout interaction. She reports, ?there?s a trinkling of them from the black community that are still fighting me.? Patient sleep/appetite unknown at this time. UDS negative; EtOH <10.  Additional information includes review of EMR, which reports patient called [facility] earlier in day and requested to speak with  a nurse. During this phone interaction with RN, it was reported in EMR that patient presented with rambling, scattered, and confusing speech/information. She reported that her nose was bleeding and she took gabapentin and it stopped; and then later questioned RN she was speaking with if she had dried blood on her scalp. Patient also made reports that someone from ?[employer] had called her and they had access to her phone and try to get her to cancel her appointments.     Based on my assessment, the recommendation is for reassessment by psychiatry for further evaluation.  ED provider, Dr. Loreta Ave, was informed of the disposition recommendation and is in agreement with the plan.  Current commitment status is involuntary.    Disposition   MD Contact Name: Dr. Loreta Ave  MD Contact Date: 04/23/21  MD Contact Time: 0215  Disposition Recommendation: Further Eval Needed  Admission Type: Placed on Involuntary Commitment by NH (Initiated 6/28; EXP 7/4)    Triage Screen   ED Triage Access Screening : The patient is not exhibiting and/or reporting manic behaviors.;The patient is not experiencing suicidal/homicidal ideations.;The patient did not arrive under IVC or TDO.;There is no indication or report of substance dependency.  Type of Screen: If NOT Face to Face, Skip to Disposition Section): Face to Face (tele-psych)  Referral Source: [facility] ED  Referral Source Contact Number: [phone]  Release Signed: No  Referral Source Contacted: Yes  Release for Community Providers: No  Information Provided By:: Patient;Other: (EMR)  Court Appointed Guardian: No  Are you a Veteran?: No  Precipitating Factors: Patient reports, ?I was having convulsions. I started having them in my car. I had to call the paramedics or someone called them for me. I have a major headache right now.? Patient reports recently starting to have ?convulsions,? in which she ?starts shaking on the inside and I should be passed out. But I know I can feel them coming  on. And I felt one coming on so I thought I called for the paramedics but maybe someone else did.? Patient then reports neurological damages in her eyes. Patient reports ?I have bone structures sticking out of my forehead.? Patient reports Rx Gabapentin 100mg  TID ?but I need a stronger medication.? She reports having a double EEG sch?d soon on the ?29th and 30th.? Patient reports, ?I filed harassment against someone and he married someone in my church and now they?re all against me. The black community is now against me.? Patient discusses cyber bullying; ?but it?s the bone structure.? Patient speech is scattered and disorganized. Patient then discusses meals she eats regularly without being questioned about this. She reports needing to stay restful ?inside of me.? Patient reports needing ?internal rest.? Patient denies SI/HI/psychosis; however, presents with confusion and disorganization with repetitive speech; obsessive delusions and questionable paranoia. She continues to report ?stinging pain? in her head. Patient reports, ?they messed me up pretty bad; the guy that I filed harassment against; physically inside.? Patient denies abuse when questioned. Patient denies Rx medications other then gabapentin. Patient reports being ?sick to my stomach. I need to throw up. I?m just physically sick.? Patient reports possible OPT at [facility]; ?I think they?ve been calling me. I don?t know.? Patient reports living alone currently; history of homelessness. Patient reports ?there?s noises in the walls; a knocking constantly? that she discusses in regard to living environment. Patient remains  severely confused and disorganized throughout interaction. She reports, ?there?s a trinkling of them from the black community that are still fighting me.? Patient sleep/appetite unknown at this time. UDS negative; EtOH <10.  Date of last yearly physical:: unknown  Outside help or community services at home: Other  (Comment) (UTA)  Is there anyone that you know, or are related to, on the Behavioral Health unit?: No    General Information   Type of Screen: If NOT Face to Face, Skip to Disposition Section): Face to Face (tele-psych)  Referral Source: [facility]  Referral Source Contact Number: [phone]  Release Signed: No  Referral Source Contacted: Yes  Release for Community Providers: No  Information Provided By:: Patient;Other: (EMR)  Court Appointed Guardian: No  Are you a Veteran?: No  Precipitating Factors: Patient reports, ?I was having convulsions. I started having them in my car. I had to call the paramedics or someone called them for me. I have a major headache right now.? Patient reports recently starting to have ?convulsions,? in which she ?starts shaking on the inside and I should be passed out. But I know I can feel them coming on. And I felt one coming on so I thought I called for the paramedics but maybe someone else did.? Patient then reports neurological damages in her eyes. Patient reports ?I have bone structures sticking out of my forehead.? Patient reports Rx Gabapentin  TID ?but I need a stronger medication.? She reports having a double EEG sch?d soon on the ?29th and 30th.? Patient reports, ?I filed harassment against someone and he married someone in my church and now they?re all against me. The black community is now against me.? Patient discusses cyber bullying; ?but it?s the bone structure.? Patient speech is scattered and disorganized. Patient then discusses meals she eats regularly without being questioned about this. She reports needing to stay restful ?inside of me.? Patient reports needing ?internal rest.? Patient denies SI/HI/psychosis; however, presents with confusion and disorganization with repetitive speech; obsessive delusions and questionable paranoia. She continues to report ?stinging pain? in her head. Patient reports, ?they messed me up pretty bad; the guy that I filed harassment  against; physically inside.? Patient denies abuse when questioned. Patient denies Rx medications other then gabapentin. Patient reports being ?sick to my stomach. I need to throw up. I?m just physically sick.? Patient reports possible OPT at [facility]; ?I think they?ve been calling me. I don?t know.? Patient reports living alone currently; history of homelessness. Patient reports ?there?s noises in the walls; a knocking constantly? that she discusses in regard to living environment. Patient remains severely confused and disorganized throughout interaction. She reports, ?there?s a trinkling of them from the black community that are still fighting me.? Patient sleep/appetite unknown at this time. UDS negative; EtOH <10.  Date of last yearly physical:: unknown  Outside help or community services at home: Other (Comment) (UTA)  Is there anyone that you know, or are related to, on the Behavioral Health unit?: No    Potential Risk to Self   Suicidal threats/behaviors in past 6 months?: No  Suicidal Ideation or Suicide Threats: No  Recent attempt to Harm Self?: No  Intent for above: No  Currently engaging in self-injurious behavior?: No  History of Suicidal/Self-Injuring behaviors?: No  History of Suicidal/Self Injurious Behavior Last 6 months?: No  History of Suicidal/Self-Injuring behaviors  Greater than the past 6 months?: No  Access to firearms?: No  Other means of Harm?: No    Potential  Risk to Others   Homicidal threats/behaviors in past 6 months?: No  Homicidal Ideation or Homicidal Threats?: No  Named Individual: No  Recent attempt to Harm Another?: No  Intent for above: No  Patient currently assaultive or combative?: No  History of Homicidal Acts/Assaultive behaviors?: No  History of Homicidal Acts/Assaultive behaviors within past 6 months?: No  History of Homicidal Acts/Assaultive behaviors   Greater than the past 6 months?: No  Access to firearms?: No  Other means of Harm?: No    Symptoms    Sleep pattern changed:  (UTA)  Sleeping increased:  (UTA)  Sleeping decreased:  (UTA)  Problems:  (UTA)  Use sleep aid:  (UTA)    Appetite change:  (UTA)  Weight change:  (UTA)  Appetite Problems::  (UTA)    Hopelessness/Helplessness: Yes  Crying spells/mood swings: No  Low energy/fatigue: Yes  Concentration problems: Yes  Psychomotor retardation/agitation: No  Feelings of guilt/worthlessness: No  Social withdrawal: No  Recurrent thoughts of death: No  Deterioration in Activities of Daily Living: Yes    Rapid pressured speech: No  Increase in impulsivity: No  Increase in energy: No  Flight of ideas/loose association: Yes    Excessive worry: Yes  Nervousness: No  Irritability: No  Shortness of breath: No  Racing heart rate: No  Sweaty/Chills/Hot flashes: No  Nausea/Vomiting/Diarrhea: No  Chest Pain: No    Additional Symptom Information: Additional information includes review of EMR, which reports patient called [facility] earlier in day and requested to speak with a nurse. During this phone interaction with RN, it was reported in EMR that patient presented with rambling, scattered, and confusing speech/information. She reported that her nose was bleeding and she took gabapentin and it stopped; and then later questioned RN she was speaking with if she had dried blood on her scalp. Patient also made reports that someone from ?[employer] had called her and they had access to her phone and try to get her to cancel her appointments.    Psychosis   Delusions: Obsessions  Hallucinations: Unable to assess  Ambivalence: No (Comment)  Confusion: Yes  Disorganization: Yes    Treatments   Treatments?: Yes  [facility]  Treatment Type: Behavioral Health;Inpatient  Treatment Date of Next Appt or Last Appt: last 2019; multiple admissions  Additional Treatment?: No  Did you follow up with your aftercare appointment?:  (UTA)  Did you take your medication as prescribed?:   (UTA)    Substance Use   Substance use in past 12 months?: No  Drug Screen: Negative  History of Substance Use/Abuse:: Not screened due to cognitive status  Tobacco/Nicotine Use?: Not screened due to cognitive status  Other non-substance addictive behaviors:: none reported    Alcohol Abuse   History of Alcohol Use/Abuse:: Not screened due to cognitive status      Functioning   Dressing: Independent  Bathing: Independent  Toileting: Independent  Feeding: Independent  Hearing - Right Ear: Functional  Hearing - Left Ear: Functional  Vision - Right Eye: Functional  Vision - Left  Eye: Functional  Walks in Home: Independent  Patient Fall Risk Level: Low/medium  Possible barriers to participate in Treatment/Programming?: No  Current living arrangements (who lives with): reports living alone  Able to return to Current Living Arrangements?: Yes  Support System:: none identified  Financial concerns: None  Healthy coping skills: Other:  Recreational/Leisure activities: "take gabapentin"  Religious/Spiritual orientation: Christian  Cultural Preferences: none reported    Strengths/Limitations  Strength 1: has housing  Strength 2: has transportation  Limitation 1: lacks supports    BH History   Patient Employed?: No (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, 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)  History of Abuse?: No  Trauma: none reported  Bereavement: none reported    Legal Issues   Legal: No  Engineer, drilling?: No    Child/Adolescent Assessment   Child / Adolescent?: No    Collateral Contacts   Collateral Contact Info: Pt Refused    Mental Status   General Appearance: Equal to stated age  Motor Activity: Tense;Fidgety  Speech: Rambling  Exhibited Behavior: Anxious;Cooperative;Preoccupied  Affect Range /Display: Poor Eye Contact;Flat  Mood Range /Display: Poor Eye Contact;Flat  Affect/Mood Display: Calm;Dull  Mood: Helpless;Despair  Thought Process: Delusions;Paranoia  Thought Content: Tangential;Confused;Disorganized;Circumstantial  Insight:  Poor  Orientation To:: Person (Yes);Place (No);Situation (Yes);Date (Yes)    BP 141/84 (BP Location: Left arm, Patient Position: Sitting)   Pulse 81   Temp 97.5 F (36.4 C)   Resp 14   Wt 260 lb (117.9 kg)   SpO2 99%     Electronically signed:  Margreta Journey, [facility]  04/23/2021 / 2:26 AM    Electronically signed by Margreta Journey, LCMHC at 04/23/2021  2:28 AM EDT

## 2021-04-23 NOTE — Progress Notes (Signed)
Formatting of this note is different from the original.  [facility]  [facility] - Tele-Behavioral Placement and Disposition Note    Patient Name:  Ruth Carroll  Date of Birth:  Feb 01, 1966   Today's Date:  April 23, 2021  Disposition   - ED Contact Name: Donia Guiles, RN  - ED Contact Date: April 23, 2021  - ED Contact Time: 1523  - Disposition Recommendation: Inpatient psych admission  - Admission Type: Involuntary  - Accepting Facility Name/Address: [facility] [Adult] ([address] [place], [place])  - Accepting Facility Report phone number: [phone]  - Accepting Psychiatrist Name: Dr. Estill Cotta  - Assigned Room Number: [facility]  - Guardian Contacted: N/A  - Disposition Comments: bed is available tomorrow 6/29 pending neg Covid     Electronically signed by:  Billey Chang, LCSW  04/23/2021 3:20 PM      Electronically signed by Billey Chang, LCSW at 04/23/2021  3:26 PM EDT

## 2021-04-24 NOTE — Progress Notes (Signed)
Formatting of this note is different from the original.  [facility]  [facility] Psychiatry - Telepsychiatry Follow Up  Visit was conducted with the use of interactive audio and video telecommunications systems that permits real time communication between provider and patient    Patient Location: Patient location at time of Tele-Consult: [facility]  Provider Location: Provider Location at time of Tele-Consult: Home  Date of Service: 04/24/2021  Referral Source: BIB EMS  Record Review: moderate  Assessment     Psychiatric Diagnoses:  Active Problems:    Other schizoaffective disorders (*)    Medical Diagnoses:  Per medical team    Formulation and MDM: Ruth Carroll is a 55 y.o. female with a history of Schizoaffective disorder, DM, HTN, seizures, depression, and delusions, who presented to the ED after being BIB EMS when found asleep in her car on a main road. Per ED note: 55 year old female with history diabetes mellitus, hypertension, seizure disorder, schizophrenia, MDD, delusional disorder, presents the emergency department for behavioral health evaluation.    Patient believes that her bones protruding from her forehead.  She is fixated the abnormal bone structure of her skull.  She states it is causing her to have a headache.  She states she is actively prohibiting herself from convulsing, as she is shaking on the inside.    History is limited due to patient's disorganized thoughts and tangential speech.    The patient has been evaluated and determined to be medically stable by the ED provider.  Patient has been assessed by the ED [facility] Therapist and the findings have been discussed with this provider.  Psychiatry was consulted to assist with psychiatric assessment and treatment/disposition planning.  The chart has been reviewed and pertinent findings are noted below. Based on this review and assessment, the treatment plan has been created and discussed with the  treatment team.     Based on my assessment, patient requires psychiatric hospitalization due to psychosis.    Treatment Plan & Recommendation     - Disposition:   - Seek Inpatient Psychiatric Hospitalization    -Accepted to [facility] and is awaiting transportation.  - Commitment Status: Involuntary  - Precautions:   - elopement  - Pertinent Labs:   - QTc 436, Head CT: No acute abnormalities  - Psych Med Recs:   - Defer to inpatient team  - Medical Recs:   - per medical team    Chief Complaint     "I feel OK I guess."    History of Present Illness     On evaluation today patient presents delusional, paranoid, hyper-verbal, tangential and illogical, and is pleasant and maintains appropriate eye contact. When asked about her mood she states, "I feel OK I guess." She reports sleeping well and having a poor appetite. Patient rambles throughout assessment about church members, the man spying on her through the ceiling, and how numerous people are involved in covering it up. She continues to feel like her abdomen is, "Electric, like I may have seizures through it." Denies SI/HI/AVH. Not responding to unseen others or internal stimuli.    Will admit patient to [facility] for safety and stabilization due to psychosis.    Review Of Systems:  See HPI. All reviewed systems are negative except pertinent positives identified in the HPI.    Past Psychiatric History     Previous diagnoses: schizoaffective, depression, delusions  Previous psychiatric medication trials: per record review: gabapentin, risperidone, Invega LAI  Past suicidal/homicidal ideation/attempt:  denies  Current/Past psychiatric provider: denies psychiatrist  Previous psychiatric hospitalizations/Rehab: [facility]    Past Medical History     History reviewed. No pertinent past medical history.    Substance Use History     Marijuana: Denies  Cocaine: Denies  Opiates: Denies  Stimulants: Denies  Benzodiazepine: Denies  Tobacco:  Denies  Alcohol: Denies  Other illicit drug usage:  Denies    Patient denies all other substance use except for what is listed above.    Readiness for substance/alcohol abuse treatment, if applicable: Not applicable    Family History     Family history of suicide? Unknown    History reviewed. No pertinent family history.    Social History     Lives alone in apt. Hx of homelessness  Support: family  Trauma: hesitates and is vague and then later denies.     Social History     Socioeconomic History   ? Marital status: Married     Spouse name: Not on file   ? Number of children: Not on file   ? Years of education: Not on file   ? Highest education level: Not on file   Occupational History   ? Not on file   Tobacco Use   ? Smoking status: Not on file   ? Smokeless tobacco: Not on file   Substance and Sexual Activity   ? Alcohol use: Not on file   ? Drug use: Not on file   ? Sexual activity: Not on file   Other Topics Concern   ? Not on file   Social History Narrative   ? Not on file     Social Determinants of Health     Financial Resource Strain: Not on file   Food Insecurity: Not on file   Transportation Needs: Not on file   Physical Activity: Not on file   Stress: Not on file   Social Connections: Not on file   Intimate Partner Violence: Not on file   Housing Stability: Not on file     Evaluation     Vitals:   Vitals:    04/24/21 1000   BP: 142/76   Pulse: 75   Resp:    Temp: 98.2 F (36.8 C)   SpO2: 97%     Medications:    Allergies:  Allergies   Allergen Reactions   ? Pork-Derived Products Nausea Only     Labs:  Recent Results (from the past 24 hour(s))   COVID-19, NAA/PCR Nasopharyngeal Swab Nasopharyngeal Swab    Collection Time: 04/23/21  8:35 PM   Result Value Ref Range    SARS-COV-2 Not Detected Not Detected     Mental Status Evaluation     Constitutional:    General Appearance Wearing hospital scrubs and normal appearance    General Behavior Pleasant and cooperative   Musculoskeletal:    Gait and Station In bed for duration of assessment    Strength and tone  UTA due to tele-assessment modality    Psychiatric:    Psychomotor Activity No motor abnormalities   Speech  Hyper verbal   Mood  Concerned, Suspicious  and Fearful   Affect  Flat   Thought Process Illogical and Tangential   Associations Intact association   Thought Content/Perceptual Disturbances No Evidence of: Hallucinations, Suicidal ideation and Homicidal ideation  Evidence of:  Delusions: Persecutory  Paranoid ideations   Cognition/Sensorium  Orientation (AAOx4): Not intact, Memory: Not intact, Attention: Intact, Language: Intact and Fund of knowledge:  Intact   Insight  Poor   Judgment Poor     Discussed case with Dr. Lavell Luster.     Electronically signed by:  Milus Height, PMHNP  04/24/2021 10:39 AM      Electronically signed by Annie Paras, DO at 04/24/2021  3:13 PM EDT

## 2021-04-24 NOTE — ED Notes (Signed)
Formatting of this note might be different from the original.  Called civil spoke to [facility] transport pt to [facility] signed by Daryel Gerald at 04/24/2021  8:58 AM EDT

## 2021-04-24 NOTE — Progress Notes (Signed)
Formatting of this note is different from the original.  [facility]        Assumed care @ 12AM. Pt is slated for inpatient and has a bed at [facility]. Awaiting transfer    Electronically signed:  Montine Circle, [facility]  04/24/2021 / 1:18 AM      Electronically signed by Montine Circle, [facility] at 04/24/2021  1:19 AM EDT

## 2021-04-24 NOTE — ED Provider Notes (Signed)
Formatting of this note is different from the original.  [facility]    ED Progress Note    Patient was seen an examined this am on morning rounds.  Patient remains medically stable for behavioral health care. Patient discussed with nursing staff.     All labs reviewed - yes/no  Any medications that need a level (Lithium, Depakote) - yes/no/last level  [facility] consult done? Yes/no, findings?  Psych consult done?  Yes/no, findings?    Events of the past 24 hours include: Patient states that she feels like she had electrical shocks through her body and complains of mild upper abdominal pain.  She denies nausea.    Physical examination:    BP (!) 160/81   Pulse 89   Temp 97.7 F (36.5 C) (Oral)   Resp 21   Wt 117.9 kg (260 lb)   SpO2 97%     Cardiovascular regular rate and rhythm without murmurs rubs gallops, lungs-clear to auscultation, abdomen-soft nondistended mild epigastric tenderness no guarding.  Neurological-alert with nonfocal exam.  Psychiatric-calm and cooperative.  Denies suicidal homicidal ideation.    Assessment: Patient continues to remain medically stable and in need of mental health treatment.      Plan:  Current medical issues being addressed in the ED include:    Plan for psychiatric care will be deferred to the mental health team.  Patient has been evaluated by behavioral health in recommends inpatient treatment.  Patient has been accepted to [facility].  Patient medically clear for transfer.    Current Facility-Administered Medications:   ?  aluminum & magnesium hydroxide-simethicone (MAALOX,MYLANTA,ANTACID ANTI-GAS) 200-200-20 mg/5 mL oral suspension 30 mL, 30 mL, Oral, Q6H PRN, Amy E Benchina, PMHNP  ?  diphenhydrAMINE (BANOPHEN,BENADRYL) capsule 50 mg, 50 mg, Oral, Q6H PRN, 50 mg at 04/23/21 1724 **OR** diphenhydrAMINE (BENADRYL) injection 50 mg, 50 mg, Intramuscular, Q6H PRN, Amy E Benchina, PMHNP  ?  gabapentin (NEURONTIN) capsule 300 mg, 300 mg, Oral, TID, Leodis Rains, MD, 300 mg at 04/24/21 5956  ?  hydrOXYzine pamoate (VISTARIL) capsule 50 mg, 50 mg, Oral, Q4H PRN, Amy E Benchina, PMHNP  ?  ibuprofen (ADVIL,MOTRIN) tablet 600 mg, 600 mg, Oral, Q8H PRN, Amy E Benchina, PMHNP  ?  Melatonin 10 mg, 10 mg, Oral, 0200, Leodis Rains, MD  ?  metFORMIN (GLUCOPHAGE) tablet 500 mg, 500 mg, Oral, Daily with breakfast, Leodis Rains, MD, 500 mg at 04/24/21 0816  ?  OLANZapine zydis (ZYPREXA) disintegrating tablet 10 mg, 10 mg, Oral, Q8H PRN, 10 mg at 04/23/21 1725 **OR** OLANZapine (ZYPREXA) injection 10 mg, 10 mg, Intramuscular, Q8H PRN, Amy E Benchina, PMHNP  ?  ondansetron (ZOFRAN-ODT) disintegrating tablet 4 mg, 4 mg, Oral, Q4H PRN, 4 mg at 04/23/21 1724 **OR** ondansetron (ZOFRAN) injection 4 mg, 4 mg, Intramuscular, Q4H PRN, Amy E Benchina, PMHNP  ?  polyethylene glycol (MIRALAX) packet 17 g, 17 g, Oral, Daily PRN, Amy E Benchina, PMHNP  ?  pravastatin sodium (PRAVACHOL) tablet 40 mg, 40 mg, Oral, HS, Leodis Rains, MD, 40 mg at 04/23/21 2035  ?  traZODone (DESYREL) tablet 50 mg, 50 mg, Oral, HS PRN, Amy E Benchina, PMHNP    Current Outpatient Medications:   ?  atorvastatin calcium (LIPITOR) 10 mg tablet, Take 10 mg by mouth., Disp: , Rfl:   ?  gabapentin (NEURONTIN) 300 mg capsule, Take 300 mg by mouth 3 (three) times a day., Disp: , Rfl:   ?  Melatonin 10 MG TABS, Take 10 mg by mouth., Disp: , Rfl:   ?  metFORMIN (GLUCOPHAGE) 500 MG tablet, Take 500 mg by mouth 1 (one) time each day with breakfast., Disp: , Rfl:   ?  ondansetron (ZOFRAN-ODT) 4 mg disintegrating tablet, Take 4 mg by mouth every 8 (eight) hours as needed., Disp: , Rfl:     Electronically Signed by:    Leodis Rains, MD  04/24/21 1001    Electronically signed by Leodis Rains, MD at 04/24/2021 10:01 AM EDT

## 2022-03-24 ENCOUNTER — Encounter (HOSPITAL_COMMUNITY): Payer: Self-pay | Admitting: Emergency Medicine

## 2022-03-24 ENCOUNTER — Emergency Department (HOSPITAL_COMMUNITY)
Admission: EM | Admit: 2022-03-24 | Discharge: 2022-03-24 | Disposition: A | Discharge status: Home / Self Care | Payer: Medicare Other | Attending: Emergency Medicine | Admitting: Emergency Medicine

## 2022-03-24 ENCOUNTER — Other Ambulatory Visit: Payer: Self-pay

## 2022-03-24 DIAGNOSIS — R42 Dizziness and giddiness: Secondary | ICD-10-CM | POA: Diagnosis not present

## 2022-03-24 DIAGNOSIS — F209 Schizophrenia, unspecified: Secondary | ICD-10-CM | POA: Diagnosis not present

## 2022-03-24 DIAGNOSIS — R079 Chest pain, unspecified: Secondary | ICD-10-CM | POA: Diagnosis not present

## 2022-03-24 DIAGNOSIS — R569 Unspecified convulsions: Secondary | ICD-10-CM | POA: Diagnosis present

## 2022-03-24 DIAGNOSIS — F22 Delusional disorders: Secondary | ICD-10-CM | POA: Diagnosis not present

## 2022-03-24 DIAGNOSIS — F445 Conversion disorder with seizures or convulsions: Secondary | ICD-10-CM | POA: Insufficient documentation

## 2022-03-24 LAB — RAPID URINE DRUG SCREEN, HOSP PERFORMED
Amphetamines: NOT DETECTED
Barbiturates: NOT DETECTED
Benzodiazepines: NOT DETECTED
Cocaine: NOT DETECTED
Opiates: NOT DETECTED
Tetrahydrocannabinol: NOT DETECTED

## 2022-03-24 LAB — CBC WITH DIFFERENTIAL/PLATELET
Abs Immature Granulocytes: 0.01 10*3/uL (ref 0.00–0.07)
Basophils Absolute: 0 10*3/uL (ref 0.0–0.1)
Basophils Relative: 1 %
Eosinophils Absolute: 0.1 10*3/uL (ref 0.0–0.5)
Eosinophils Relative: 2 %
HCT: 37.6 % (ref 36.0–46.0)
Hemoglobin: 11.5 g/dL — ABNORMAL LOW (ref 12.0–15.0)
Immature Granulocytes: 0 %
Lymphocytes Relative: 30 %
Lymphs Abs: 1.9 10*3/uL (ref 0.7–4.0)
MCH: 25.4 pg — ABNORMAL LOW (ref 26.0–34.0)
MCHC: 30.6 g/dL (ref 30.0–36.0)
MCV: 83 fL (ref 80.0–100.0)
Monocytes Absolute: 0.3 10*3/uL (ref 0.1–1.0)
Monocytes Relative: 4 %
Neutro Abs: 4.1 10*3/uL (ref 1.7–7.7)
Neutrophils Relative %: 63 %
Platelets: 297 10*3/uL (ref 150–400)
RBC: 4.53 MIL/uL (ref 3.87–5.11)
RDW: 13.6 % (ref 11.5–15.5)
WBC: 6.4 10*3/uL (ref 4.0–10.5)
nRBC: 0 % (ref 0.0–0.2)

## 2022-03-24 LAB — BASIC METABOLIC PANEL
Anion gap: 6 (ref 5–15)
BUN: 18 mg/dL (ref 6–20)
CO2: 24 mmol/L (ref 22–32)
Calcium: 9 mg/dL (ref 8.9–10.3)
Chloride: 110 mmol/L (ref 98–111)
Creatinine, Ser: 0.95 mg/dL (ref 0.44–1.00)
GFR, Estimated: >60 mL/min (ref >60)
Glucose, Bld: 107 mg/dL — ABNORMAL HIGH (ref 70–99)
Potassium: 3.8 mmol/L (ref 3.5–5.1)
Sodium: 140 mmol/L (ref 135–145)

## 2022-03-24 LAB — ETHANOL: Alcohol, Ethyl (B): <10 mg/dL (ref <10)

## 2022-03-24 MED ORDER — SODIUM CHLORIDE 0.9 % IV BOLUS
1000.0000 mL | Freq: Once | INTRAVENOUS | Status: AC
  Administered 2022-03-24: 1000 mL via INTRAVENOUS

## 2022-03-24 NOTE — ED Triage Notes (Signed)
Pt BIB [facility] from a gas station. Patient drove herself to gas station and not feeling right feeling as though she was having tremors. Upon getting to gas station bystanders witnessed patient fall to the ground and was shaking, patient able to open eyes to bystander and whisper "I'm seizing". Bystanders note head moving back and forth along with arms moving back and forth rhythmically. Per EMS patient did not appear post ictal Pt had bowel and urinary incontinence prior to having the episode at this gas station per the patient. Enroute to hospital patient complaining of new onset of chest pain and SHOB.   VSS: BP: 126/82  HR-68  CBG 125  Resp-16

## 2022-03-24 NOTE — ED Notes (Signed)
RN attempted to discharge pt. Pt insisting that she is going to have a seizure and her eyes are rolling out of her head. Pt made aware that she needs to follow-up with neurology. Pt continues to bring up her 2019 trip to the ER and states they messed up her MRI results. EDP came to bedside to reassure pt with follow-up.

## 2022-03-24 NOTE — Discharge Instructions (Addendum)
You are seen in the ER after you had a seizure-like activity.  Work-up here is reassuring.  Please consider following up with the behavioral health urgent care for any mental illness crisis. Return to the emergency room if you start having severe symptoms.

## 2022-03-24 NOTE — ED Provider Notes (Addendum)
[facility] EMERGENCY DEPARTMENT Provider Note   CSN: [id number] Arrival date & time: 03/24/22  1137     History  Chief Complaint  Patient presents with  . Dizziness    [name] is a 56 y.o. female.  HPI     56 year old female comes in with chief complaint of dizziness.  Per EMS, patient had driven to the gas station herself.  While at the gas station, she started having tremors and bystanders called EMS.  Allegedly, patient was telling the bystanders as she was seizing that she was having a seizure.  Per EMS, patient was not confused when they saw her.  Patient also complained of some chest pain, but now denies having chest pain.  She denies any substance use disorder.  She has no medical problems.  On our records it is noted that patient has history of schizophrenia, homelessness, diabetes and delusional disorder along with psychogenic seizures with Upon further questioning, patient indicates that she drove to the gas station because she was fearful of being at home.  I asked her about the reason for it.  She states that some people at church could be tracking her.  She has no suicidal ideation or homicidal ideation.  She denies any mental health disease history and is not taking any psychiatric medications.  She does not want to see any psychiatrist at this time.  Home Medications Prior to Admission medications   Medication Sig Start Date End Date Taking? Authorizing Provider  acetaminophen (TYLENOL) 325 MG tablet Take 650 mg by mouth every 6 (six) hours as needed for mild pain or headache.    [provider]  benztropine (COGENTIN) 0.5 MG tablet Take 1 tablet (0.5 mg total) by mouth 2 (two) times daily. Patient not taking: Reported on 01/15/2020 12/29/19   Johnn Hai, MD  gabapentin (NEURONTIN) 300 MG capsule TAKE 1 CAPSULE(300 MG) BY MOUTH THREE TIMES DAILY 04/13/20   Cameron Sprang, MD  metoCLOPramide (REGLAN) 10 MG tablet Take 1 tablet (10 mg  total) by mouth as needed for nausea. Take 1 tablet as needed for nausea and vomitting Patient not taking: Reported on 12/27/2019 09/21/19   Cameron Sprang, MD  paliperidone (INVEGA SUSTENNA) 156 MG/ML SUSY injection Inject 1 mL (156 mg total) into the muscle once for 1 dose. (Due on 01-05-20): For mood control 01/05/20 01/05/20  Lindell Spar I, NP  Paliperidone ER (INVEGA SUSTENNA) injection Inject 117 mg into the muscle every 28 (twenty-eight) days. (Due on 02-02-20): For mood control 02/02/20   Lindell Spar I, NP  risperiDONE (RISPERDAL) 3 MG tablet Take 2 tablets (6 mg total) by mouth at bedtime. Patient not taking: Reported on 01/15/2020 12/29/19   Johnn Hai, MD  temazepam (RESTORIL) 30 MG capsule Take 1 capsule (30 mg total) by mouth at bedtime. Patient not taking: Reported on 01/15/2020 12/29/19   Johnn Hai, MD      Allergies    Pork-derived products    Review of Systems   Review of Systems  Physical Exam Updated Vital Signs BP 134/75   Pulse 66   Temp 98.1 F (36.7 C) (Oral)   Resp 16   Ht 5' 5.5" (1.664 m)   Wt 119 kg   LMP 10/29/2011 Comment: NEG U PREG 02/24/17  SpO2 99%   BMI 42.99 kg/m  Physical Exam Vitals and nursing note reviewed.  Constitutional:      Appearance: She is well-developed.  HENT:     Head: Atraumatic.  Eyes:     Extraocular Movements: Extraocular movements intact.     Pupils: Pupils are equal, round, and reactive to light.  Cardiovascular:     Rate and Rhythm: Normal rate.  Pulmonary:     Effort: Pulmonary effort is normal.  Musculoskeletal:     Cervical back: Normal range of motion and neck supple.  Skin:    General: Skin is warm and dry.  Neurological:     Mental Status: She is alert and oriented to person, place, and time.  Psychiatric:        Behavior: Behavior normal.     Comments: Delusional thinking    ED Results / Procedures / Treatments   Labs (all labs ordered are listed, but only abnormal results are displayed) Labs Reviewed   CBC WITH DIFFERENTIAL/PLATELET - Abnormal; Notable for the following components:      Result Value   Hemoglobin 11.5 (*)    MCH 25.4 (*)    All other components within normal limits  BASIC METABOLIC PANEL - Abnormal; Notable for the following components:   Glucose, Bld 107 (*)    All other components within normal limits  ETHANOL  RAPID URINE DRUG SCREEN, HOSP PERFORMED    EKG EKG Interpretation  Date/Time:  Monday Mar 24 2022 12:01:35 EDT Ventricular Rate:  71 PR Interval:  164 QRS Duration: 82 QT Interval:  392 QTC Calculation: 425 R Axis:   73 Text Interpretation: Normal sinus rhythm with sinus arrhythmia Cannot rule out Inferior infarct , age undetermined Abnormal ECG When compared with ECG of 12-Jun-2020 15:51, PREVIOUS ECG IS PRESENT Confirmed by Varney Biles [phone]) on 03/24/2022 4:05:41 PM  Radiology No results found.  Procedures Procedures    Medications Ordered in ED Medications  sodium chloride 0.9 % bolus 1,000 mL (0 mLs Intravenous Stopped 03/24/22 1723)    ED Course/ Medical Decision Making/ A&P Clinical Course as of 03/24/22 1725  Mon Mar 24, 2022  1620 Patient is requesting that we start her on gabapentin, she was taking that for her seizures before.  I indicated to her that I am not comfortable with that idea.  She is requesting that be sent to her with neurology referral.  I have provided her with information on neuro follow-up.  She can follow-up with them as needed. [AN]  1724 Patient requesting further reassessment.  We went over reassessment again.  Discussed with her that she cannot drive for 6 months after seizure or syncope per [facility].  We have given her information on behavioral health urgent care as well in addition to neurology.  [AN]    Clinical Course User Index [AN] Varney Biles, MD                           Medical Decision Making Amount and/or Complexity of Data Reviewed Labs: ordered.  This patient presents to the  ED with chief complaint(s) of seizure-like activity with pertinent past medical history of nonepileptic seizure, delusions, schizophrenia and diabetes which further complicates the presenting complaint. The complaint involves an extensive differential diagnosis and also carries with it a high risk of complications and morbidity.    The differential diagnosis includes : Nonepileptic seizure clinically, especially listening to what EMS said about the event, patient not being postictal, not having any incontinence and her talking to bystanders while she was having the seizure.  Her neuro exam is nonfocal.  She had complained of chest pain, EKG ordered.  It is reassuring.  At this time patient is not having any chest pain or shortness of breath.  Given her past medical history, we did ask her questions about the circumstances surrounding this event.  Patient states that she has been under stress because people have been tracking her.  She normally lives in [place].  It appears to me that she is slightly delusional right now.  She denies any SI, HI.  She denies any psychiatric history and does not want to be on any medications  I called patient's emergency contact, Ms. Vaughan Basta on 2 separate occasions.  At no point did anyone pick up.  Patient wants to go home.  Clinically, she is not in psychiatry crisis.  I do not have reason to involuntarily commit her.  Patient agreed to stay in the ER for neurochecks, at this time she is requesting again to go home, and we will proceed with that plan.  Additional history obtained: Additional history obtained from EMS  Records reviewed  previous ER visits  Independent labs interpretation:  The following labs were independently interpreted: UDS that is normal, CBC, metabolic profile that is normal.   Treatment and Reassessment: Patient reassessed again at 4 PM.  Continues to do well.    Consideration for admission or further workup: We considered  psychiatry consultation, however patient has declined.  No reason to IVC her.   Final Clinical Impression(s) / ED Diagnoses Final diagnoses:  Psychogenic nonepileptic seizure  Dizziness    Rx / DC Orders ED Discharge Orders     None            Varney Biles, MD 03/24/22 1725

## 2022-03-24 NOTE — Progress Notes (Signed)
PT Cancellation Note  Patient Details Name: [name] MRN: [id number] DOB: 1966/06/15   Cancelled Treatment:    Reason Eval/Treat Not Completed: PT screened, no needs identified, will sign off. Per nursing pt is mobilizing without difficulty.    [place] 03/24/2022, 4:57 PM [place] Office [phone]

## 2022-03-27 ENCOUNTER — Telehealth: Payer: Self-pay | Admitting: Neurology

## 2022-03-27 NOTE — Telephone Encounter (Signed)
Error

## 2022-03-28 ENCOUNTER — Emergency Department (HOSPITAL_BASED_OUTPATIENT_CLINIC_OR_DEPARTMENT_OTHER): Payer: Medicare Other

## 2022-03-28 ENCOUNTER — Encounter (HOSPITAL_BASED_OUTPATIENT_CLINIC_OR_DEPARTMENT_OTHER): Payer: Self-pay

## 2022-03-28 ENCOUNTER — Telehealth: Payer: Self-pay | Admitting: Neurology

## 2022-03-28 ENCOUNTER — Other Ambulatory Visit: Payer: Self-pay

## 2022-03-28 ENCOUNTER — Emergency Department (HOSPITAL_BASED_OUTPATIENT_CLINIC_OR_DEPARTMENT_OTHER)
Admission: EM | Admit: 2022-03-28 | Discharge: 2022-03-28 | Disposition: A | Discharge status: Home / Self Care | Payer: Medicare Other | Attending: Emergency Medicine | Admitting: Emergency Medicine

## 2022-03-28 DIAGNOSIS — E119 Type 2 diabetes mellitus without complications: Secondary | ICD-10-CM | POA: Diagnosis not present

## 2022-03-28 DIAGNOSIS — Z5900 Homelessness unspecified: Secondary | ICD-10-CM | POA: Diagnosis not present

## 2022-03-28 DIAGNOSIS — Z79899 Other long term (current) drug therapy: Secondary | ICD-10-CM | POA: Diagnosis not present

## 2022-03-28 DIAGNOSIS — D649 Anemia, unspecified: Secondary | ICD-10-CM | POA: Insufficient documentation

## 2022-03-28 DIAGNOSIS — R0789 Other chest pain: Secondary | ICD-10-CM | POA: Insufficient documentation

## 2022-03-28 DIAGNOSIS — I10 Essential (primary) hypertension: Secondary | ICD-10-CM | POA: Diagnosis not present

## 2022-03-28 LAB — BASIC METABOLIC PANEL
Anion gap: 7 (ref 5–15)
BUN: 18 mg/dL (ref 6–20)
CO2: 23 mmol/L (ref 22–32)
Calcium: 8.9 mg/dL (ref 8.9–10.3)
Chloride: 111 mmol/L (ref 98–111)
Creatinine, Ser: 0.83 mg/dL (ref 0.44–1.00)
GFR, Estimated: >60 mL/min (ref >60)
Glucose, Bld: 103 mg/dL — ABNORMAL HIGH (ref 70–99)
Potassium: 3.7 mmol/L (ref 3.5–5.1)
Sodium: 141 mmol/L (ref 135–145)

## 2022-03-28 LAB — CBC WITH DIFFERENTIAL/PLATELET
Abs Immature Granulocytes: 0.02 10*3/uL (ref 0.00–0.07)
Basophils Absolute: 0 10*3/uL (ref 0.0–0.1)
Basophils Relative: 0 %
Eosinophils Absolute: 0.2 10*3/uL (ref 0.0–0.5)
Eosinophils Relative: 3 %
HCT: 35.3 % — ABNORMAL LOW (ref 36.0–46.0)
Hemoglobin: 11.2 g/dL — ABNORMAL LOW (ref 12.0–15.0)
Immature Granulocytes: 0 %
Lymphocytes Relative: 29 %
Lymphs Abs: 1.8 10*3/uL (ref 0.7–4.0)
MCH: 25.7 pg — ABNORMAL LOW (ref 26.0–34.0)
MCHC: 31.7 g/dL (ref 30.0–36.0)
MCV: 81.1 fL (ref 80.0–100.0)
Monocytes Absolute: 0.3 10*3/uL (ref 0.1–1.0)
Monocytes Relative: 5 %
Neutro Abs: 3.7 10*3/uL (ref 1.7–7.7)
Neutrophils Relative %: 63 %
Platelets: 300 10*3/uL (ref 150–400)
RBC: 4.35 MIL/uL (ref 3.87–5.11)
RDW: 13.5 % (ref 11.5–15.5)
WBC: 6 10*3/uL (ref 4.0–10.5)
nRBC: 0 % (ref 0.0–0.2)

## 2022-03-28 LAB — TROPONIN I (HIGH SENSITIVITY): Troponin I (High Sensitivity): 2 ng/L (ref <18)

## 2022-03-28 NOTE — ED Notes (Signed)
Pt request different nurse to start IV and draw labs, stating appearance reminds her of a negative appearance in the past, requests dark haired nurse to care for her.  Pt with hx schizophrenia.  Does not appear to be responding to external stimulus, observed to call out from room.

## 2022-03-28 NOTE — Telephone Encounter (Signed)
Received referral. We are unable to see the patient. She is established with another neurologist.

## 2022-03-28 NOTE — ED Triage Notes (Addendum)
Pt believes people are stalking her, and has had a lot of stress in her life. States has a dull sharp pain on left side of chest.   Hx of schizophrenia
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
## 2022-03-28 NOTE — Discharge Instructions (Signed)
Your workup today was reassuring. I do not think that it is your heart that is causing your symptoms. You should call and schedule an appointment with the PCP that I have listed in your discharge paperwork.

## 2022-03-29 ENCOUNTER — Emergency Department (HOSPITAL_BASED_OUTPATIENT_CLINIC_OR_DEPARTMENT_OTHER)
Admission: EM | Admit: 2022-03-29 | Discharge: 2022-03-29 | Disposition: A | Discharge status: Home / Self Care | Payer: Medicare Other | Attending: Emergency Medicine | Admitting: Emergency Medicine

## 2022-03-29 ENCOUNTER — Encounter (HOSPITAL_BASED_OUTPATIENT_CLINIC_OR_DEPARTMENT_OTHER): Payer: Self-pay | Admitting: Emergency Medicine

## 2022-03-29 DIAGNOSIS — R200 Anesthesia of skin: Secondary | ICD-10-CM | POA: Insufficient documentation

## 2022-03-29 DIAGNOSIS — R252 Cramp and spasm: Secondary | ICD-10-CM | POA: Diagnosis present

## 2022-03-29 DIAGNOSIS — R4582 Worries: Secondary | ICD-10-CM | POA: Diagnosis not present

## 2022-03-29 DIAGNOSIS — Z711 Person with feared health complaint in whom no diagnosis is made: Secondary | ICD-10-CM

## 2022-03-29 DIAGNOSIS — F419 Anxiety disorder, unspecified: Secondary | ICD-10-CM | POA: Insufficient documentation

## 2022-03-29 LAB — CBG MONITORING, ED: Glucose-Capillary: 100 mg/dL — ABNORMAL HIGH (ref 70–99)

## 2022-03-29 NOTE — ED Notes (Signed)
ED Provider at bedside. 

## 2022-03-29 NOTE — Discharge Instructions (Signed)
It was our pleasure to provide your ER care today - we hope that you feel better.  Drink plenty of fluids/stay well hydrated.   Take your meds as prescribed by your doctors.  Follow up closely with your primary care doctor and behavioral health specialist this coming week.  For mental health issues and/or crisis, you may also go to the [facility] - see attached info - it is open 24/7 and walk-ins are welcome.   Return to ER if worse, new symptoms, fevers, chest pain, trouble breathing, or other concern.

## 2022-03-29 NOTE — ED Notes (Signed)
Reprinted her AVS from a few days ago with neurology referral, but pt is insistent she gets a new referral. EDP made aware

## 2022-03-29 NOTE — ED Provider Notes (Signed)
[facility] DEPARTMENT Provider Note   CSN: [id number] Arrival date & time: 03/29/22  1418     History  Chief Complaint  Patient presents with   Weakness    [name] is a 56 y.o. female.  Patient c/o transient tingling and cramping to fingers of right hand earlier today - states she wanted to get checked to make sure she wasn't having a stroke or seizure. No current numbness or tingling. No weakness or loss of normal function of extremities. No change in speech or vision. Denies headache. No neck pain or radicular pain. No chest pain or sob. No abd pain. No fever or chills.   The history is provided by the patient and medical records. The history is limited by the condition of the patient.  Weakness Associated symptoms: no abdominal pain, no chest pain, no cough, no diarrhea, no dysuria, no fever, no headaches, no shortness of breath and no vomiting       Home Medications Prior to Admission medications   Medication Sig Start Date End Date Taking? Authorizing Provider  acetaminophen (TYLENOL) 325 MG tablet Take 650 mg by mouth every 6 (six) hours as needed for mild pain or headache.    [provider]  benztropine (COGENTIN) 0.5 MG tablet Take 1 tablet (0.5 mg total) by mouth 2 (two) times daily. Patient not taking: Reported on 01/15/2020 12/29/19   Johnn Hai, MD  gabapentin (NEURONTIN) 300 MG capsule TAKE 1 CAPSULE(300 MG) BY MOUTH THREE TIMES DAILY 04/13/20   Cameron Sprang, MD  metoCLOPramide (REGLAN) 10 MG tablet Take 1 tablet (10 mg total) by mouth as needed for nausea. Take 1 tablet as needed for nausea and vomitting Patient not taking: Reported on 12/27/2019 09/21/19   Cameron Sprang, MD  paliperidone (INVEGA SUSTENNA) 156 MG/ML SUSY injection Inject 1 mL (156 mg total) into the muscle once for 1 dose. (Due on 01-05-20): For mood control 01/05/20 01/05/20  Lindell Spar I, NP  Paliperidone ER (INVEGA SUSTENNA) injection Inject 117 mg into the  muscle every 28 (twenty-eight) days. (Due on 02-02-20): For mood control 02/02/20   Lindell Spar I, NP  risperiDONE (RISPERDAL) 3 MG tablet Take 2 tablets (6 mg total) by mouth at bedtime. Patient not taking: Reported on 01/15/2020 12/29/19   Johnn Hai, MD  temazepam (RESTORIL) 30 MG capsule Take 1 capsule (30 mg total) by mouth at bedtime. Patient not taking: Reported on 01/15/2020 12/29/19   Johnn Hai, MD      Allergies    Pork-derived products    Review of Systems   Review of Systems  Constitutional:  Negative for chills and fever.  HENT:  Negative for sore throat and trouble swallowing.   Eyes:  Negative for pain, redness and visual disturbance.  Respiratory:  Negative for cough and shortness of breath.   Cardiovascular:  Negative for chest pain, palpitations and leg swelling.  Gastrointestinal:  Negative for abdominal pain, diarrhea and vomiting.  Genitourinary:  Negative for dysuria and flank pain.  Musculoskeletal:  Negative for back pain and neck pain.  Skin:  Negative for rash.  Neurological:  Positive for weakness and numbness. Negative for headaches.  Hematological:  Does not bruise/bleed easily.  Psychiatric/Behavioral:  Negative for agitation. The patient is nervous/anxious.    Physical Exam Updated Vital Signs BP 133/88 (BP Location: Left Wrist)   Pulse 69   Temp 98.5 F (36.9 C) (Oral)   Resp 16   Ht 1.664 m (5' 5.5")  Wt 125.2 kg   LMP 10/29/2011 Comment: NEG U PREG 02/24/17  SpO2 98%   BMI 45.23 kg/m  Physical Exam Vitals and nursing note reviewed.  Constitutional:      Appearance: Normal appearance. She is well-developed.  HENT:     Head: Atraumatic.     Nose: Nose normal.     Mouth/Throat:     Mouth: Mucous membranes are moist.     Pharynx: Oropharynx is clear.  Eyes:     General: No scleral icterus.    Conjunctiva/sclera: Conjunctivae normal.     Pupils: Pupils are equal, round, and reactive to light.  Neck:     Vascular: No carotid bruit.      Trachea: No tracheal deviation.     Comments: No stiffness or rigidity. Normal rom. No spine tenderness.  Cardiovascular:     Rate and Rhythm: Normal rate and regular rhythm.     Pulses: Normal pulses.     Heart sounds: Normal heart sounds. No murmur heard.   No friction rub. No gallop.  Pulmonary:     Effort: Pulmonary effort is normal. No respiratory distress.     Breath sounds: Normal breath sounds.  Abdominal:     General: Bowel sounds are normal. There is no distension.     Palpations: Abdomen is soft.     Tenderness: There is no abdominal tenderness.  Genitourinary:    Comments: No cva tenderness.  Musculoskeletal:        General: No swelling or tenderness.     Cervical back: Normal range of motion and neck supple. No rigidity. No muscular tenderness.     Right lower leg: No edema.     Left lower leg: No edema.  Skin:    General: Skin is warm and dry.     Findings: No rash.  Neurological:     Mental Status: She is alert.     Comments: Alert, speech normal, no dysarthria or aphasia. Motor intact bil, stre 5/5. No pronator drift. Sensation grossly intact bil. Steady gait.   Psychiatric:     Comments: Mildly anxious. Denies feeling depressed. No thoughts of self harm. Pt does not currently appear to be responding to internal stimuli - no hallucinations noted.     ED Results / Procedures / Treatments   Labs (all labs ordered are listed, but only abnormal results are displayed) Labs Reviewed  CBG MONITORING, ED - Abnormal; Notable for the following components:      Result Value   Glucose-Capillary 100 (*)    All other components within normal limits    EKG None  Radiology DG Chest Portable 1 View  Result Date: 03/28/2022 CLINICAL DATA:  Left-sided chest pain. EXAM: PORTABLE CHEST 1 VIEW COMPARISON:  Chest x-ray dated July 16, 2020. FINDINGS: The heart size and mediastinal contours are within normal limits. Both lungs are clear. The visualized skeletal structures  are unremarkable. IMPRESSION: No active disease. Electronically Signed   By: Titus Dubin M.D.   On: 03/28/2022 09:39    Procedures Procedures    Medications Ordered in ED Medications - No data to display  ED Course/ Medical Decision Making/ A&P                           Medical Decision Making Problems Addressed: Anxiety: acute illness or injury with systemic symptoms that poses a threat to life or bodily functions Numbness: acute illness or injury with systemic symptoms Worried well: acute  illness or injury with systemic symptoms  Amount and/or Complexity of Data Reviewed External Data Reviewed: labs and notes. Labs: ordered. Decision-making details documented in ED Course. Radiology: independent interpretation performed. Decision-making details documented in ED Course.   Iv ns. Continuous pulse ox and cardiac monitoring. Labs ordered/sent. Imaging ordered.   Reviewed nursing notes and prior charts for additional history. External reports reviewed. Recent sets of labs reviewed - given normal labs yesterday, do not feel beneficial to repeat today.   Cardiac monitor: sinus rhythm, rate 68.  Labs reviewed/interpreted by me - glucose normal.   Recent chest xrays reviewed/interpreted by me - no pna.   Earlier symptoms resolved. Non focal neuro exam.  Give recent ED visits, discussed mental health issues - pt indicates not currently taking meds as in past did not feel as if meds helped.  Pt denies thoughts of harm to self or others. After reassurance about no indication of seizure or stroke, pt calm, content appearing. Pt not currently responding to internal stimuli - no hallucinations. Discussed importance of close outpatient pcp and bh f/u - pt is agreeable.   Vitals normal. Symptoms resolved.   Pt currently appears stable for d/c.   Return precautions provided.            Final Clinical Impression(s) / ED Diagnoses Final diagnoses:  None    Rx / DC  Orders ED Discharge Orders     None         Lajean Saver, MD 03/29/22 [phone]
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
## 2022-03-29 NOTE — ED Notes (Signed)
Provided AVS to client, spoke with pt extensively in regards to seeing the [facility] as recommended on her AVS. Client again wants a referral to Neurologist, stated reason/ rationale of seeing a Primary Care MD first which could aid in proper assessment and evaluation to aid in facilitating a referral or consulting a Neurology Group. Pt agreed. Opportunity for further questions and or explanation of care prior to DC from facility
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
## 2022-04-04 ENCOUNTER — Encounter (HOSPITAL_BASED_OUTPATIENT_CLINIC_OR_DEPARTMENT_OTHER): Payer: Self-pay

## 2022-04-04 ENCOUNTER — Emergency Department (HOSPITAL_BASED_OUTPATIENT_CLINIC_OR_DEPARTMENT_OTHER)
Admission: EM | Admit: 2022-04-04 | Discharge: 2022-04-04 | Disposition: A | Discharge status: Home / Self Care | Payer: Medicare Other | Attending: Emergency Medicine | Admitting: Emergency Medicine

## 2022-04-04 ENCOUNTER — Other Ambulatory Visit (HOSPITAL_BASED_OUTPATIENT_CLINIC_OR_DEPARTMENT_OTHER): Payer: Self-pay

## 2022-04-04 DIAGNOSIS — R251 Tremor, unspecified: Secondary | ICD-10-CM

## 2022-04-04 DIAGNOSIS — Z760 Encounter for issue of repeat prescription: Secondary | ICD-10-CM

## 2022-04-04 MED ORDER — GABAPENTIN 300 MG PO CAPS
ORAL_CAPSULE | ORAL | 0 refills | Status: DC
Start: 2022-04-04 — End: 2022-06-29
  Filled 2022-04-04: qty 90, 30d supply, fill #0

## 2022-04-04 MED ORDER — POLYMYXIN B-TRIMETHOPRIM 10000-0.1 UNIT/ML-% OP SOLN: 1.0000 [drp] | OPHTHALMIC | 0 refills | Status: DC

## 2022-04-04 MED ORDER — GABAPENTIN 300 MG PO CAPS
300.0000 mg | ORAL_CAPSULE | Freq: Once | ORAL | Status: AC
  Administered 2022-04-04: 300 mg via ORAL
  Filled 2022-04-04: qty 1

## 2022-04-04 MED ORDER — POLYMYXIN B-TRIMETHOPRIM 10000-0.1 UNIT/ML-% OP SOLN
1.0000 [drp] | OPHTHALMIC | 0 refills | Status: DC
  Filled 2022-04-04: qty 10, 34d supply, fill #0

## 2022-04-04 NOTE — ED Notes (Signed)
Resource for homeless pt provided

## 2022-04-04 NOTE — ED Provider Notes (Signed)
[name]   CSN: [id number] Arrival date & time: 04/04/22  1220     History  Chief Complaint  Patient presents with   Tremors    [name] is a 56 y.o. female.  56 yo F with a chief complaints of occasional tremors.  She tells me that this happens when she runs out of her gabapentin.  She would like to have that refilled.        Home Medications Prior to Admission medications   Medication Sig Start Date End Date Taking? Authorizing Provider  acetaminophen (TYLENOL) 325 MG tablet Take 650 mg by mouth every 6 (six) hours as needed for mild pain or headache.    [provider]  benztropine (COGENTIN) 0.5 MG tablet Take 1 tablet (0.5 mg total) by mouth 2 (two) times daily. Patient not taking: Reported on 01/15/2020 12/29/19   Johnn Hai, MD  gabapentin (NEURONTIN) 300 MG capsule TAKE 1 CAPSULE(300 MG) BY MOUTH THREE TIMES DAILY 04/04/22   Deno Etienne, DO  metoCLOPramide (REGLAN) 10 MG tablet Take 1 tablet (10 mg total) by mouth as needed for nausea. Take 1 tablet as needed for nausea and vomitting Patient not taking: Reported on 12/27/2019 09/21/19   Cameron Sprang, MD  paliperidone (INVEGA SUSTENNA) 156 MG/ML SUSY injection Inject 1 mL (156 mg total) into the muscle once for 1 dose. (Due on 01-05-20): For mood control 01/05/20 01/05/20  Lindell Spar I, NP  Paliperidone ER (INVEGA SUSTENNA) injection Inject 117 mg into the muscle every 28 (twenty-eight) days. (Due on 02-02-20): For mood control 02/02/20   Lindell Spar I, NP  risperiDONE (RISPERDAL) 3 MG tablet Take 2 tablets (6 mg total) by mouth at bedtime. Patient not taking: Reported on 01/15/2020 12/29/19   Johnn Hai, MD  temazepam (RESTORIL) 30 MG capsule Take 1 capsule (30 mg total) by mouth at bedtime. Patient not taking: Reported on 01/15/2020 12/29/19   Johnn Hai, MD      Allergies    Pork-derived products    Review of Systems   Review of Systems  Physical  Exam Updated Vital Signs BP 114/70   Pulse 78   Temp 98.8 F (37.1 C)   Resp 18   Ht '5\' 5"'$  (1.651 m)   Wt (!) 219 kg   LMP 10/29/2011 Comment: NEG U PREG 02/24/17  SpO2 95%   BMI 80.34 kg/m  Physical Exam Vitals and nursing [name] reviewed.  Constitutional:      General: She is not in acute distress.    Appearance: She is well-developed. She is not diaphoretic.  HENT:     Head: Normocephalic and atraumatic.  Eyes:     Pupils: Pupils are equal, round, and reactive to light.  Cardiovascular:     Rate and Rhythm: Normal rate and regular rhythm.     Heart sounds: No murmur heard.    No friction rub. No gallop.  Pulmonary:     Effort: Pulmonary effort is normal.     Breath sounds: No wheezing or rales.  Abdominal:     General: There is no distension.     Palpations: Abdomen is soft.     Tenderness: There is no abdominal tenderness.  Musculoskeletal:        General: No tenderness.     Cervical back: Normal range of motion and neck supple.  Skin:    General: Skin is warm and dry.  Neurological:     Mental Status: She is alert  and oriented to person, place, and time.  Psychiatric:        Behavior: Behavior normal.     ED Results / Procedures / Treatments   Labs (all labs ordered are listed, but only abnormal results are displayed) Labs Reviewed - No data to display  EKG None  Radiology No results found.  Procedures Procedures    Medications Ordered in ED Medications  gabapentin (NEURONTIN) capsule 300 mg (has no administration in time range)    ED Course/ Medical Decision Making/ A&P                           Medical Decision Making Risk Prescription drug management.   56 yo F with a significant past medical history of schizophrenia and psychogenic nonepileptic seizures.  Comes in with a chief complaints of tremors.  No tremors on my exam.  She tells me that these typically get better with gabapentin and she is run out.  We will represcribe her here.  Have  her follow-up with her neurologist.  12:36 PM:  I have discussed the diagnosis/risks/treatment options with the patient.  Evaluation and diagnostic testing in the emergency department does not suggest an emergent condition requiring admission or immediate intervention beyond what has been performed at this time.  They will follow up with  PCP, neuro. We also discussed returning to the ED immediately if new or worsening sx occur. We discussed the sx which are most concerning (e.g., sudden worsening pain, fever, inability to tolerate by mouth) that necessitate immediate return. Medications administered to the patient during their visit and any new prescriptions provided to the patient are listed below.  Medications given during this visit Medications  gabapentin (NEURONTIN) capsule 300 mg (has no administration in time range)     The patient appears reasonably screen and/or stabilized for discharge and I doubt any other medical condition or other [facility] requiring further screening, evaluation, or treatment in the ED at this time prior to discharge.           Final Clinical Impression(s) / ED Diagnoses Final diagnoses:  Occasional tremors  Medication refill    Rx / DC Orders ED Discharge Orders          Ordered    gabapentin (NEURONTIN) 300 MG capsule        04/04/22 [place], [place], DO 04/04/22 1236

## 2022-04-04 NOTE — ED Triage Notes (Signed)
Pt reports she has tremors and ran out of gabapentin months ago. Tremors are ongoing. Living in her car and doesn't have money to go to PCP

## 2022-04-04 NOTE — Discharge Instructions (Addendum)
I refilled your medication.  Please follow-up with your neurologist in the office.

## 2022-04-04 NOTE — ED Notes (Signed)
Pts discharged instructions and meds reviewed with her.States understanding. Tech wheeled pt to lobby and pt is aware of meds at pharmacy

## 2022-04-06 ENCOUNTER — Encounter (HOSPITAL_BASED_OUTPATIENT_CLINIC_OR_DEPARTMENT_OTHER): Payer: Self-pay | Admitting: Emergency Medicine

## 2022-04-06 ENCOUNTER — Other Ambulatory Visit: Payer: Self-pay

## 2022-04-06 ENCOUNTER — Emergency Department (HOSPITAL_BASED_OUTPATIENT_CLINIC_OR_DEPARTMENT_OTHER)
Admission: EM | Admit: 2022-04-06 | Discharge: 2022-04-07 | Disposition: A | Discharge status: Home / Self Care | Payer: Medicare Other | Attending: Emergency Medicine | Admitting: Emergency Medicine

## 2022-04-06 ENCOUNTER — Emergency Department (HOSPITAL_BASED_OUTPATIENT_CLINIC_OR_DEPARTMENT_OTHER)
Admission: EM | Admit: 2022-04-06 | Discharge: 2022-04-06 | Discharge status: Absent without leave | Payer: Medicare Other | Source: Home / Self Care

## 2022-04-06 DIAGNOSIS — F22 Delusional disorders: Secondary | ICD-10-CM | POA: Diagnosis present

## 2022-04-06 DIAGNOSIS — R45851 Suicidal ideations: Secondary | ICD-10-CM | POA: Insufficient documentation

## 2022-04-06 DIAGNOSIS — F209 Schizophrenia, unspecified: Secondary | ICD-10-CM | POA: Diagnosis not present

## 2022-04-06 DIAGNOSIS — R454 Irritability and anger: Secondary | ICD-10-CM | POA: Insufficient documentation

## 2022-04-06 DIAGNOSIS — Z20822 Contact with and (suspected) exposure to covid-19: Secondary | ICD-10-CM | POA: Insufficient documentation

## 2022-04-06 DIAGNOSIS — Z046 Encounter for general psychiatric examination, requested by authority: Secondary | ICD-10-CM | POA: Diagnosis present

## 2022-04-06 LAB — CBC WITH DIFFERENTIAL/PLATELET
Abs Immature Granulocytes: 0.02 10*3/uL (ref 0.00–0.07)
Basophils Absolute: 0 10*3/uL (ref 0.0–0.1)
Basophils Relative: 0 %
Eosinophils Absolute: 0.1 10*3/uL (ref 0.0–0.5)
Eosinophils Relative: 2 %
HCT: 37.1 % (ref 36.0–46.0)
Hemoglobin: 11.6 g/dL — ABNORMAL LOW (ref 12.0–15.0)
Immature Granulocytes: 0 %
Lymphocytes Relative: 18 %
Lymphs Abs: 1.3 10*3/uL (ref 0.7–4.0)
MCH: 25.6 pg — ABNORMAL LOW (ref 26.0–34.0)
MCHC: 31.3 g/dL (ref 30.0–36.0)
MCV: 81.7 fL (ref 80.0–100.0)
Monocytes Absolute: 0.3 10*3/uL (ref 0.1–1.0)
Monocytes Relative: 5 %
Neutro Abs: 5.5 10*3/uL (ref 1.7–7.7)
Neutrophils Relative %: 75 %
Platelets: 357 10*3/uL (ref 150–400)
RBC: 4.54 MIL/uL (ref 3.87–5.11)
RDW: 13.6 % (ref 11.5–15.5)
WBC: 7.2 10*3/uL (ref 4.0–10.5)
nRBC: 0 % (ref 0.0–0.2)

## 2022-04-06 LAB — BASIC METABOLIC PANEL
Anion gap: 8 (ref 5–15)
BUN: 14 mg/dL (ref 6–20)
CO2: 22 mmol/L (ref 22–32)
Calcium: 8.8 mg/dL — ABNORMAL LOW (ref 8.9–10.3)
Chloride: 110 mmol/L (ref 98–111)
Creatinine, Ser: 0.97 mg/dL (ref 0.44–1.00)
GFR, Estimated: >60 mL/min (ref >60)
Glucose, Bld: 151 mg/dL — ABNORMAL HIGH (ref 70–99)
Potassium: 3.6 mmol/L (ref 3.5–5.1)
Sodium: 140 mmol/L (ref 135–145)

## 2022-04-06 LAB — RAPID URINE DRUG SCREEN, HOSP PERFORMED
Amphetamines: NOT DETECTED
Barbiturates: NOT DETECTED
Benzodiazepines: NOT DETECTED
Cocaine: NOT DETECTED
Opiates: NOT DETECTED
Tetrahydrocannabinol: NOT DETECTED

## 2022-04-06 LAB — SARS CORONAVIRUS 2 BY RT PCR: SARS Coronavirus 2 by RT PCR: NEGATIVE

## 2022-04-06 LAB — ETHANOL: Alcohol, Ethyl (B): <10 mg/dL (ref <10)

## 2022-04-06 MED ORDER — RISPERIDONE 2 MG PO TABS
6.0000 mg | ORAL_TABLET | Freq: Every day | ORAL | Status: DC
  Filled 2022-04-06: qty 2

## 2022-04-06 MED ORDER — GABAPENTIN 300 MG PO CAPS
300.0000 mg | ORAL_CAPSULE | Freq: Every day | ORAL | Status: DC
  Administered 2022-04-06: 300 mg via ORAL
  Filled 2022-04-06: qty 1
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
## 2022-04-06 NOTE — ED Notes (Addendum)
Called to [facility], informed security and this RN she did need a evaluation because she was thinking about hurting herself. ED MD informed of same. Escorted back to room 6

## 2022-04-06 NOTE — ED Notes (Signed)
Pt. Stated that "she doesn't feel well", but vitals are all WNL and continues to spit into emesis bag.

## 2022-04-06 NOTE — ED Notes (Signed)
TTS underway.

## 2022-04-06 NOTE — ED Notes (Signed)
Pt  is currently standing the corner, spitting into emesis bag. Not wanting to sit or lay down at this time.

## 2022-04-06 NOTE — ED Triage Notes (Signed)
Pt arrives pov, c/o "being upset r/t getting hotel room". Pt  denies pain, denies medical issues, reports thoughts of "wanting to hurt someone else"
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
## 2022-04-06 NOTE — ED Notes (Signed)
Called staffing for sitter need. No system wide available at this time .
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
## 2022-04-06 NOTE — ED Notes (Addendum)
Offered the pt some food or beverage, pt declined at this time. Continues to speak with anyone that walks by the room and keeps requesting for the RN, PD, MD. I have explained that everyone is very busy, if she had a question that I could answer it, she stated "never mind".

## 2022-04-06 NOTE — ED Notes (Signed)
Patient denying to this RN that she wanted to hurt herself or anyone else.  Patient angry about being IVC'd and requesting to talk to the charge RN.  Charge RN made aware.

## 2022-04-06 NOTE — ED Notes (Signed)
IVC paperwork was faxed to Magistrate at [facility]

## 2022-04-06 NOTE — ED Notes (Signed)
Pt given wash basin and toothbrush/toothpaste to wash up.  Pt supervised during this time.  New scrubs and new socks given.  Pt calm and cooperative at this time.  Awaiting TTS eval.

## 2022-04-06 NOTE — Discharge Instructions (Addendum)
For your behavioral health needs you are advised to follow up with one of the providers listed below at your earliest opportunity:       [facility], [place].      [place], [place] [address]      ([address]) [address].      [place], [place] [address]      ([address]) [place]      [place],  [address]       [phone]

## 2022-04-06 NOTE — ED Notes (Signed)
Patient giving self a sink bath with sitter present.  TTS moved to bedside when patient finished up for assessment.

## 2022-04-06 NOTE — ED Notes (Signed)
Again offered food and drink, refused at this time.

## 2022-04-06 NOTE — ED Notes (Signed)
VS to be assessed q 4-8 hrs prn

## 2022-04-06 NOTE — ED Notes (Signed)
This RN and Charge RN Mateo Flow went in to patient's room for covid swab.  Patient informed of plan of care with admission and the process.  Patient stated "we will just let this play out when my attorney reviews this."  Patient agreeable to covid swab, and swab performed.

## 2022-04-06 NOTE — ED Notes (Signed)
Patient dressed out in burgundy colored scrubs Teal socks Note in chart with patients belongings secured in cabinet above nurses station

## 2022-04-07 DIAGNOSIS — F22 Delusional disorders: Secondary | ICD-10-CM

## 2022-04-07 MED ORDER — ACETAMINOPHEN 325 MG PO TABS
650.0000 mg | ORAL_TABLET | Freq: Four times a day (QID) | ORAL | Status: DC | PRN
  Administered 2022-04-07: 650 mg via ORAL
  Filled 2022-04-07: qty 2

## 2022-04-07 MED ORDER — NAPHAZOLINE-GLYCERIN 0.012-0.2 % OP SOLN: 1.0000 [drp] | Freq: Four times a day (QID) | OPHTHALMIC | Status: DC | PRN

## 2022-04-07 MED ORDER — GABAPENTIN 300 MG PO CAPS
300.0000 mg | ORAL_CAPSULE | Freq: Three times a day (TID) | ORAL | Status: DC
  Administered 2022-04-07: 300 mg via ORAL
  Filled 2022-04-07: qty 1

## 2022-04-07 NOTE — ED Notes (Signed)
Patient sleeping  Shift change Transport on the way to pick up patient

## 2022-04-07 NOTE — ED Notes (Signed)
Patient woke up and told sitter that she wanted an MRI because "she bit her tongue because she was having a seizure."  Patient alert and oriented the entire time after waking up from sleeping.  Patient fell back asleep after telling sitter this. No seizure like activity noted. MD [place] made aware.
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
## 2022-04-07 NOTE — ED Notes (Signed)
[name] ED made aware patient has left with transport on the way to their facility

## 2022-04-07 NOTE — ED Notes (Addendum)
2 pt belongings in cabinet designated for [facility]. Pt wanded by secuirty.

## 2022-04-07 NOTE — ED Notes (Signed)
Pt asleep vitals will be done shortly.

## 2022-04-07 NOTE — ED Provider Notes (Signed)
Emergency Medicine Observation Re-evaluation Note  [name] is a 56 y.o. female, seen on rounds today.  Pt initially presented to the ED for complaints of Psychiatric Evaluation Currently, the patient is resting comfortably in hallway.  Physical Exam  BP (!) 122/91 (BP Location: Right Wrist) Comment: Moving arm to much  Pulse 75   Temp 98 F (36.7 C) (Oral)   Resp 18   Ht 1.664 m (5' 5.5")   Wt 125.2 kg   LMP 10/29/2011 Comment: NEG U PREG 02/24/17  SpO2 100%   BMI 45.23 kg/m  Physical Exam General: No acute distress  Psych: Calm and cooperative  ED Course / MDM  EKG:   I have reviewed the labs performed to date as well as medications administered while in observation.  Recent changes in the last 24 hours include none.  Plan  Current plan is for psychiatric evaluation.  [name] is not under involuntary commitment.     Lacretia Leigh, MD 04/07/22 [phone]

## 2022-04-07 NOTE — BH Assessment (Signed)
[place] Assessment Progress Note   Per Charmaine Downs, NP, this pt does not require psychiatric hospitalization at this time.  Pt presents under IVC initiated by EDP Malvin Johns, MD which has been rescinded by Hampton Abbot, MD.  Pt is psychiatrically cleared.  Discharge instructions include referrals for several area behavioral health providers.  EDP Lacretia Leigh, MD and pt's nurse, Clarise Cruz, have been notified.  [name], [place] Triage Specialist [phone]

## 2022-04-07 NOTE — ED Notes (Addendum)
Patient woke up and stated "I'm having seizures inside me," but would not elaborate further.  Patient caox4 and in NAD with no seizure like activity noted. Patient states she normally takes gabapentin 300 mg TID to stop this.  MD made aware.

## 2022-04-07 NOTE — ED Provider Notes (Deleted)
Patient sent from [place] draw bridge to be seen by psychiatry.  Resting comfortably in hallway.  Awaiting psychiatric assessment   Lacretia Leigh, MD 04/07/22 [phone]

## 2022-04-09 ENCOUNTER — Other Ambulatory Visit: Payer: Self-pay | Admitting: Specialist

## 2022-04-09 DIAGNOSIS — G40209 Localization-related (focal) (partial) symptomatic epilepsy and epileptic syndromes with complex partial seizures, not intractable, without status epilepticus: Secondary | ICD-10-CM

## 2022-04-09 DIAGNOSIS — G3184 Mild cognitive impairment, so stated: Secondary | ICD-10-CM

## 2022-04-22 ENCOUNTER — Other Ambulatory Visit: Payer: Self-pay

## 2022-04-22 ENCOUNTER — Emergency Department (HOSPITAL_BASED_OUTPATIENT_CLINIC_OR_DEPARTMENT_OTHER)
Admission: EM | Admit: 2022-04-22 | Discharge: 2022-04-22 | Discharge status: Against Medical Advice | Payer: Medicare Other

## 2022-04-22 NOTE — ED Notes (Signed)
This RN and Shirlean Mylar, tech taking patient back to room to be triaged. Pt made it halfway down the hall and jumped out of wheelchair, stood up, took a picture of staff and walked out saying "I'm not doing this, I'm leaving". Pt noted to drive away from facility in car. NAD noted. Triage unable to be performed.

## 2022-05-28 NOTE — ED Notes (Signed)
Formatting of this note might be different from the original.  Provided patient with warm blankets  Electronically signed by Rosita Fire, CNA at 05/28/2022  6:27 PM EDT

## 2022-05-28 NOTE — ED Provider Notes (Signed)
Formatting of this note is different from the original.  Patient placed in [facility] pathway, seen and evaluated for chief complaint of possible seizure, states she was at a gas station and bystanders noted her to be "trembling", they were concerned she was having a seizure, EMS was called. Hx of seizures and on Gabapentin.    Hx of tremors and psychogenic nonepileptic seizures.     Pertinent exam findings include non-toxic in appearance and speech is clear. Based on initial evaluation, labs are currently indicated and radiology studies are not currently indicated as allowed for current processes and treatments as applicable in a triage setting and could be different than if patient were seen in a main treatment area or dependent on labs/imagining after results are displayed.  Patient counseled on process, plan, and necessity for staying for completing the evaluation.     This document serves as a record of services personally performed by Helayne Seminole PA-C.     [facility] Emergency Department  Emergency Department Provider Note    This document was created using the aid of voice recognition Dragon dictation software    ____________________________________________    Time seen: 7:02 PM    I have reviewed the triage vital signs and the nursing notes.    History     Chief Complaint  Tremors and possible seizure    HPI   Ruth Carroll is a 56 y.o. female who presents with tremors.  The patient reports tremors in all 4 extremities at the [place] here in [facility].  Per report a bystander called [phone].  The patient reports that she was awake and alert during the entire event.  There was no loss of consciousness.  No other symptoms.  The patient reports she takes gabapentin 300 mg 3 times daily for her seizures.  She has been out of his medication for 24 to 48 hours.  Denies alcohol or drug use.  Reports she has seen a neurologist and been diagnosed with some form of a seizure disorder.   Per chart review the patient has likely psychogenic nonepileptic seizures and was placed on gabapentin for anxiety.  She has seen neurology in the past in [place].  On chart review the patient has a history of similar presentations to the [facility] system.  No bladder and no bowel incontinence no tongue biting.  The patient denies acute neurological deficits.  No new head trauma.  No new neurological deficits.    Past Medical History:   Diagnosis Date   ? Anxiety 03/29/2016   ? Delusional disorder (HCC) 01/07/2018   ? DM (diabetes mellitus) (HCC) 09/06/2012   ? Headache    ? Homelessness 11/18/2015   ? HTN (hypertension)    ? MDD (major depressive disorder), recurrent, severe, with psychosis (HCC)    ? Obstipation 05/30/2015   ? Paranoia (HCC) 04/16/2012   ? Prolonged QT interval 05/30/2015   ? Schizophrenia (HCC) 03/26/2018   ? Suicidal ideations 03/12/2015     Patient Active Problem List   Diagnosis   ? Type 2 diabetes mellitus without complication, without long-term current use of insulin (HCC)   ? Obesity   ? Insomnia   ? Dizziness   ? Tobacco use disorder   ? Non compliance w medication regimen   ? Delusional disorder (HCC)   ? Chest pain   ? Migraine without aura and without status migrainosus, not intractable   ? Illness anxiety disorder   ? Psychogenic syncope   ?  Transient alteration of awareness   ? Psychogenic nonepileptic seizure   ? Schizoaffective disorder, bipolar type [facility])     Past Surgical History:   Procedure Laterality Date   ? NO PAST SURGERIES       Current Facility-Administered Medications:   ?  gabapentin (NEURONTIN) capsule 300 mg, 300 mg, Oral, Once, Bradd Burner, MD    Current Outpatient Medications:   ?  atorvastatin (LIPITOR) 10 MG tablet, Take 1 tablet (10 mg total) by mouth daily., Disp: 90 tablet, Rfl: 3  ?  cetirizine (ZYRTEC) 10 MG tablet, Take 1 tablet (10 mg total) by mouth daily. (Patient taking differently: Take 10 mg by mouth daily as needed.), Disp: 30 tablet, Rfl: 3  ?   divalproex DR (DEPAKOTE) 500 MG tablet, Take 1 tablet (500 mg total) by mouth daily for 30 days., Disp: 30 tablet, Rfl: 0  ?  gabapentin (NEURONTIN) 300 MG capsule, Take 1 capsule (300 mg total) by mouth 2 times daily for 30 days., Disp: 60 capsule, Rfl: 0  ?  melatonin 10 mg Tab, Take 10 mg by mouth daily after dinner., Disp: , Rfl:   ?  metFORMIN (GLUCOPHAGE) 500 MG tablet, Take 1 tablet (500 mg total) by mouth daily with breakfast., Disp: 90 tablet, Rfl: 3  ?  peppermint oiL 50 mg CpDR, Take 50 mg by mouth daily., Disp: 30 capsule, Rfl: 1  ?  rimegepant (NURTEC) 75 mg tablet, Dissolve 1 tablet (75 mg total) by mouth once as needed for up to 1 dose for Migraine (can take QOD prn)., Disp: 8 tablet, Rfl: 2    Allergies  Other and Pork/porcine containing products    Family History   Problem Relation Age of Onset   ? Heart disease Mother    ? Hyperlipidemia Mother    ? Diabetes Mother    ? Hypertension Mother    ? Heart disease Father    ? Diabetes Father    ? Hypertension Father    ? Dementia Father    ? No known problems  Sister    ? Lupus Sister    ? No known problems  Brother    ? Cataracts Maternal Grandmother    ? Diabetes Maternal Grandmother    ? Glaucoma Maternal Grandmother    ? Hypertension Maternal Grandmother    ? Stroke Neg Hx    ? Seizures Neg Hx    ? Parkinsonism Neg Hx    ? Multiple sclerosis Neg Hx    ? Migraines Neg Hx    ? Depression Neg Hx    ? Macular degeneration Neg Hx      Social History  Social History     Tobacco Use   ? Smoking status: Never   ? Smokeless tobacco: Never   Vaping Use   ? Vaping Use: Never used   Substance Use Topics   ? Alcohol use: Not Currently   ? Drug use: Never     Physical Exam     VITAL SIGNS:    ED Triage Vitals   Enc Vitals Group      BP 05/28/22 1250 137/91      Pulse 05/28/22 1250 63      Resp 05/28/22 1250 16      Temp 05/28/22 1250 98.2 F (36.8 C)      Temp Source 05/28/22 1250 Oral      SpO2 05/28/22 1250 98 %      Weight 05/28/22 1250 125.2 kg (276 lb)  Height 05/28/22 1250 1.651 m (5\' 5" )      Head Circumference --       Peak Flow --       Pain Score 05/28/22 1254 0      Pain Loc --       Pain Edu? --       Excl. in GC? --      Constitutional: Alert and oriented. Well appearing and in no distress.  Eyes: Conjunctivae are normal.  ENT       Head: Normocephalic and atraumatic.       Nose: No congestion.       Mouth/Throat: Mucous membranes are moist.       Neck: No stridor.  Cardiovascular: Normal rate, regular rhythm, no murmurs.  Respiratory: Normal respiratory effort. Breath sounds are normal.  Gastrointestinal: Soft and nontender.  Musculoskeletal: Nontender with no deformities  Neurologic:   Alert and oriented x 3.  Normal speech and language.  No slurred speech or facial droop.  Cranial nerves are intact.  EOMI. No nystagmus.  No visual field cuts, visual fields normal.  Finger to nose normal bilaterally.  Motor strength 5 out of 5 in all four extremities.  Sensation to light touch intact in all 4 extremities and torso.  Skin: Skin is warm, dry and intact. No rash noted.  Psychiatric: Mood and affect are normal.     EKG     None    Radiology     All X-rays, CTs, and MRIs interpreted by radiologist and interpretation reviewed by me.  No results found for any visits on 05/28/22.    Procedures     Procedure(s) performed: None.    Pertinent labs & imaging results that were available during my care of the patient were reviewed by me and considered in my medical decision making (see chart for details).    Total critical care time was 0 minutes. This was spent providing cardiovascular or respiratory resuscitation in a critically ill patient.    ED Clinical Impression     1. Physiological tremor    2. Seizure-like activity [facility])      Medical Decision Making: Initial Impression, ED Course, Assessment and Plan     Medical Decision Making  56 year old female presents with tremors.  The patient has not taken her chronic gabapentin 100 mg 3 times daily for 24 to 48 hours.   I strongly suspect that this is an enhanced physiologic tremor secondary to gabapentin withdrawal.  I will restart this medication however at 300 mg twice daily.  The patient was told to not mix this medication with any other sedatives hypnotics or opioids.  The patient reports she does not take any other medications.  I did order a valproic acid level as per her chart review she takes valproic acid or Depakote.  The patient however denies taking this medication reports she only takes gabapentin 300 mg 3 times daily.  No alcohol use no drug use.  The patient has had 2 negative noncontrasted head CTs within the past 1 to 2 years.  The patient has had a negative MRI of the brain from the past 2 years.  I do not think additional neuroimaging is indicated.  She has a normal neurological examination and no head injury and no new focal neurological symptoms.  The patient will be given her home dose of 3 mg of gabapentin here.  Labs reviewed.  No acute or chronic renal failure no hypoglycemia no hyperglycemia.  No hyponatremia  no other electrolyte emergencies blood counts are unremarkable.  The patient was offered follow-up here in [facility] with [facility] however she request to follow-up with her previous neurologist at [facility] in [place].  Return precautions.  She was given seizure precautions no driving no tub baths no swimming pools no heavy machinery etc.  "Do not drive a car do not operate heavy machinery do not swim in a pool do not take a tub bath etc. until you are seizure free for 6 months and approved by your neurologist.  Please continue the gabapentin as directed."    Physiological tremor: complicated acute illness or injury  Seizure-like activity [facility]): complicated acute illness or injury  Amount and/or Complexity of Data Reviewed  Labs: ordered. Decision-making details documented in ED Course.    Risk  Prescription drug management.    Clinical Complexity    Patient's  presentation is most consistent with acute presentation with potential threat to life or bodily function. .    Patient's impaired access to primary care increases the complexity of managing their  presentation with seizure like activity, tremors.      Provider time spent in patient care today, inclusive of but not limited to clinical reassessment, review of diagnostic studies, and discharge preparation, was greater than 30 minutes.           Electronically signed by: Bradd Burner, MD  05/28/22 1902    Electronically signed by Bradd Burner, MD at 05/28/2022  7:02 PM EDT
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
## 2022-06-02 ENCOUNTER — Telehealth: Payer: Self-pay | Admitting: Neurology
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
## 2022-06-04 ENCOUNTER — Encounter: Payer: Self-pay | Admitting: Neurology

## 2022-06-05 ENCOUNTER — Encounter: Payer: Self-pay | Admitting: Neurology

## 2022-06-09 ENCOUNTER — Telehealth: Payer: Self-pay | Admitting: Neurology

## 2022-06-09 NOTE — Telephone Encounter (Signed)
Patient dismissed from [facility] 06/05/22

## 2022-06-17 ENCOUNTER — Other Ambulatory Visit: Payer: Self-pay

## 2022-06-17 ENCOUNTER — Emergency Department (HOSPITAL_BASED_OUTPATIENT_CLINIC_OR_DEPARTMENT_OTHER): Payer: Medicare Other

## 2022-06-17 ENCOUNTER — Emergency Department (HOSPITAL_BASED_OUTPATIENT_CLINIC_OR_DEPARTMENT_OTHER)
Admission: EM | Admit: 2022-06-17 | Discharge: 2022-06-17 | Disposition: A | Discharge status: Home / Self Care | Payer: Medicare Other | Attending: Emergency Medicine | Admitting: Emergency Medicine

## 2022-06-17 ENCOUNTER — Encounter (HOSPITAL_BASED_OUTPATIENT_CLINIC_OR_DEPARTMENT_OTHER): Payer: Self-pay | Admitting: Emergency Medicine

## 2022-06-17 DIAGNOSIS — R531 Weakness: Secondary | ICD-10-CM | POA: Diagnosis present

## 2022-06-17 DIAGNOSIS — R4701 Aphasia: Secondary | ICD-10-CM | POA: Insufficient documentation

## 2022-06-17 DIAGNOSIS — R4781 Slurred speech: Secondary | ICD-10-CM | POA: Diagnosis not present

## 2022-06-17 DIAGNOSIS — E119 Type 2 diabetes mellitus without complications: Secondary | ICD-10-CM | POA: Diagnosis not present

## 2022-06-17 DIAGNOSIS — F200 Paranoid schizophrenia: Secondary | ICD-10-CM | POA: Insufficient documentation

## 2022-06-17 LAB — CBC WITH DIFFERENTIAL/PLATELET
Abs Immature Granulocytes: 0.01 10*3/uL (ref 0.00–0.07)
Basophils Absolute: 0 10*3/uL (ref 0.0–0.1)
Basophils Relative: 0 %
Eosinophils Absolute: 0.3 10*3/uL (ref 0.0–0.5)
Eosinophils Relative: 6 %
HCT: 35.5 % — ABNORMAL LOW (ref 36.0–46.0)
Hemoglobin: 11 g/dL — ABNORMAL LOW (ref 12.0–15.0)
Immature Granulocytes: 0 %
Lymphocytes Relative: 27 %
Lymphs Abs: 1.5 10*3/uL (ref 0.7–4.0)
MCH: 25.2 pg — ABNORMAL LOW (ref 26.0–34.0)
MCHC: 31 g/dL (ref 30.0–36.0)
MCV: 81.4 fL (ref 80.0–100.0)
Monocytes Absolute: 0.3 10*3/uL (ref 0.1–1.0)
Monocytes Relative: 5 %
Neutro Abs: 3.5 10*3/uL (ref 1.7–7.7)
Neutrophils Relative %: 62 %
Platelets: 293 10*3/uL (ref 150–400)
RBC: 4.36 MIL/uL (ref 3.87–5.11)
RDW: 14.1 % (ref 11.5–15.5)
WBC: 5.6 10*3/uL (ref 4.0–10.5)
nRBC: 0 % (ref 0.0–0.2)

## 2022-06-17 LAB — RAPID URINE DRUG SCREEN, HOSP PERFORMED
Amphetamines: NOT DETECTED
Barbiturates: NOT DETECTED
Benzodiazepines: NOT DETECTED
Cocaine: NOT DETECTED
Opiates: NOT DETECTED
Tetrahydrocannabinol: NOT DETECTED

## 2022-06-17 LAB — URINALYSIS, ROUTINE W REFLEX MICROSCOPIC
Bilirubin Urine: NEGATIVE
Glucose, UA: NEGATIVE mg/dL
Hgb urine dipstick: NEGATIVE
Ketones, ur: 15 mg/dL — AB
Leukocytes,Ua: NEGATIVE
Nitrite: NEGATIVE
Protein, ur: NEGATIVE mg/dL
Specific Gravity, Urine: 1.02 (ref 1.005–1.030)
pH: 7 (ref 5.0–8.0)

## 2022-06-17 LAB — COMPREHENSIVE METABOLIC PANEL
ALT: 14 U/L (ref 0–44)
AST: 19 U/L (ref 15–41)
Albumin: 3.6 g/dL (ref 3.5–5.0)
Alkaline Phosphatase: 90 U/L (ref 38–126)
Anion gap: 6 (ref 5–15)
BUN: 16 mg/dL (ref 6–20)
CO2: 23 mmol/L (ref 22–32)
Calcium: 8.2 mg/dL — ABNORMAL LOW (ref 8.9–10.3)
Chloride: 111 mmol/L (ref 98–111)
Creatinine, Ser: 0.79 mg/dL (ref 0.44–1.00)
GFR, Estimated: >60 mL/min (ref >60)
Glucose, Bld: 104 mg/dL — ABNORMAL HIGH (ref 70–99)
Potassium: 3.6 mmol/L (ref 3.5–5.1)
Sodium: 140 mmol/L (ref 135–145)
Total Bilirubin: 0.5 mg/dL (ref 0.3–1.2)
Total Protein: 7.1 g/dL (ref 6.5–8.1)

## 2022-06-17 LAB — ETHANOL: Alcohol, Ethyl (B): <10 mg/dL (ref <10)

## 2022-06-17 LAB — PREGNANCY, URINE: Preg Test, Ur: NEGATIVE

## 2022-06-17 NOTE — ED Triage Notes (Signed)
Reports slurred speech , dizziness , blurry vision , right side body weakness 30 minutes prior to arrival . Denies Hx HTN or TIA. Alert and oriented x 4 .  Pt drove her self to ER.  Took gabapentin last night.
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
## 2022-06-17 NOTE — ED Provider Notes (Signed)
I provided a substantive portion of the care of this patient.  I personally performed the entirety of the history, exam, and medical decision making for this encounter.  EKG Interpretation  Date/Time:  Tuesday June 17 2022 09:16:13 EDT Ventricular Rate:  59 PR Interval:  161 QRS Duration: 115 QT Interval:  419 QTC Calculation: 415 R Axis:   22 Text Interpretation: Sinus rhythm Nonspecific intraventricular conduction delay Borderline T abnormalities, anterior leads Confirmed by Fredia Sorrow [phone]) on 06/17/2022 12:06:16 PM  Patient seen by me along with the physician assistant.  Patient initially came in with concerns for may be strokelike symptoms.  But it patient has a long behavioral health history.  There seems to be a lot of paranoia that seems to be some delusions.  Has a lot of complaints that would be concerning for stroke like speech problems but none of that is present on neuro exam.  I think this is all behavioral health.  Patient denies any suicidal ideations.  She does not want to be evaluated by behavioral health.  Patient drove herself here.  So we decided to do an extensive lab work-up to see what we got urinalysis was negative CBC normal pregnancy test negative complete metabolic panel without any electrolyte abnormalities or liver function test abnormalities.  Alcohol was less than 10 .CT head without any acute findings.  If the rest of the labs if everything everything is negative I feel patient could be discharged home.  Unless she changes her mind about wanting to be interviewed by behavioral health.   Fredia Sorrow, MD 06/17/22 1537

## 2022-06-17 NOTE — ED Notes (Signed)
Discharge instructions reviewed with patient. Patient verbalizes understanding, no further questions at this time. Medications and follow up information provided. No acute distress noted at time of departure.  

## 2022-06-17 NOTE — Discharge Instructions (Addendum)
As we discussed, your work-up in the ER today was reassuring for acute abnormalities.  Laboratory evaluation and CT imaging did not show any emergent concerns to be causing your symptoms.  Have given you a referral to the community wellness center for you to follow-up with for management of your health conditions. I have also given you information for the good for coming to help overall health center for you to go to the unit if you change your mind about seeking further evaluation.  Return if development of any new or worsening symptoms.

## 2022-06-17 NOTE — ED Notes (Signed)
Pt given apple juice, crackers and popcorn per PA request.

## 2022-06-29 ENCOUNTER — Other Ambulatory Visit: Payer: Self-pay

## 2022-06-29 ENCOUNTER — Encounter (HOSPITAL_BASED_OUTPATIENT_CLINIC_OR_DEPARTMENT_OTHER): Payer: Self-pay | Admitting: Emergency Medicine

## 2022-06-29 ENCOUNTER — Emergency Department (HOSPITAL_BASED_OUTPATIENT_CLINIC_OR_DEPARTMENT_OTHER)
Admission: EM | Admit: 2022-06-29 | Discharge: 2022-06-29 | Disposition: A | Discharge status: Home / Self Care | Payer: Medicare Other | Attending: Emergency Medicine | Admitting: Emergency Medicine

## 2022-06-29 DIAGNOSIS — Z760 Encounter for issue of repeat prescription: Secondary | ICD-10-CM | POA: Insufficient documentation

## 2022-06-29 DIAGNOSIS — Z041 Encounter for examination and observation following transport accident: Secondary | ICD-10-CM | POA: Diagnosis present

## 2022-06-29 DIAGNOSIS — E119 Type 2 diabetes mellitus without complications: Secondary | ICD-10-CM | POA: Insufficient documentation

## 2022-06-29 MED ORDER — GABAPENTIN 300 MG PO CAPS: 300.0000 mg | ORAL_CAPSULE | Freq: Three times a day (TID) | ORAL | 0 refills | Status: AC

## 2022-06-29 NOTE — Discharge Instructions (Signed)
Continue taking gabapentin as previously prescribed.  Return to the emergency department if symptoms significantly worsen or change.
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
## 2022-11-23 NOTE — ED Notes (Signed)
Formatting of this note might be different from the original.  Attempted to discharge pt, pt refused to leave because he discharge paperwork said we were not concerned for a stroke or seizure. Pt asked how we know that, Dr. Lyn Hollingshead explained based off her exam she did not have a concern that pt had a stroke or seizure and that we had labs and CTs ordered but she had refused. Pt stated she now wants the labs and scan.     Alesia Morin, RN  11/23/22 [phone]    Electronically signed by Alesia Morin, RN at 11/23/2022  5:29 AM MST

## 2022-11-23 NOTE — ED Notes (Signed)
Formatting of this note might be different from the original.  Report received, assuming care of patient at this time.      [employer] [place], [place]  11/23/22 9977    Electronically signed by Eliezer Bottom, RN at 11/23/2022  7:17 AM MST

## 2022-11-23 NOTE — ED Notes (Signed)
Formatting of this note might be different from the original.  Discharging patient. IV removed, VSS, ambulatory to checkout. DC teaching given to and completed with patient including follow up information, s/s to return to ED, and s/s to discuss with PCP. Patient is agreeable to POC and has had all questions answered.       [employer] [place], [place]  11/23/22 0745    Electronically signed by Eliezer Bottom, RN at 11/23/2022  7:45 AM MST

## 2022-11-23 NOTE — ED Notes (Signed)
Formatting of this note might be different from the original.  Pt currently refusing workup, stating she thinks "she might have overdone it" coming in to the ED for her symptoms tonight. ED provider did discuss plan of care with pt, for now plan is to hold off on performing any diagnostics while giving pt some time to think things over     Gerrie Nordmann, RN  11/23/22 9833    Electronically signed by Gerrie Nordmann, RN at 11/23/2022  3:05 AM MST
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
## 2022-11-23 NOTE — ED Notes (Signed)
Formatting of this note might be different from the original.  MD bedside to discuss plan of care with pt    Gerrie Nordmann, RN  11/23/22 [id number]    Electronically signed by Gerrie Nordmann, RN at 11/23/2022  3:11 AM MST

## 2022-11-23 NOTE — ED Notes (Signed)
Formatting of this note might be different from the original.  Patient is resting comfortably.    Alesia Morin, RN  11/23/22 [phone]    Electronically signed by Alesia Morin, RN at 11/23/2022  7:12 AM MST

## 2022-11-23 NOTE — ED Notes (Signed)
Formatting of this note might be different from the original.  Report given to [facility]. denied questions about pateint care. relinquishing at this time.      Alesia Morin, RN  11/23/22 [phone]    Electronically signed by Alesia Morin, RN at 11/23/2022  7:12 AM MST

## 2022-11-23 NOTE — ED Provider Notes (Signed)
Formatting of this note is different from the original.    History of Present Illness  Chief complaint(s): Seizures    HPI: This is a 57 year old female with self-reported PMH of seizures who presents today with reported seizure like activity. Patient describes being in an iHOP and reports that there was "a lot of noise" and the "children were screaming so loud" that she felt like she was having a seizure. Denies LOC, denies urinary incontinence. Came to the ED to get "checked out medically." Denies headache, chest pain or difficulty breathing. Denies history of psychiatric diagnosis. Patient states she lives in [place] and is trying to get a bus ticket to go back to [place]. Declines providing emergency contact or family contact information to nursing staff and myself. Denies SI, HI or hallucinations. Declines psychiatric evaluation. No additional complaints.     The history was obtained from: patient    Physical Exam     Physical Exam   Constitutional:       General: Patient is not in acute distress.     Appearance: Normal appearance. Not ill-appearing.   HENT:      Head: Normocephalic and atraumatic.      Nose: Nose normal.      Mouth: Mucous membranes are moist.   Eyes:      Extraocular Movements: Extraocular movements intact.      Pupils: Pupils are equal, round, and reactive to light.   Cardiovascular:      Rate and Rhythm: Normal rate and regular rhythm.      Pulses: Normal pulses.   Pulmonary:      Effort: Pulmonary effort is normal. No respiratory distress.   Abdominal:      General: There is no distension.   Musculoskeletal:         General: No swelling or tenderness.      Cervical back: Normal range of motion and neck supple.   Skin:     General: Skin is warm.      Capillary Refill: Capillary refill takes less than 2 seconds.   Neurological:      Mental Status: Patient is alert and oriented to person, place, and time. Mental status is at baseline per patient. No focal deficits. Neurologic exam normal.    Psychiatric:         Mood and Affect: Mood paranoid with tangential and pressured speech, however patient alert and oriented and expressed understanding of situation. Expressed forward thinking.   No SI or HI, no hallucinations     Behavior: Behavior normal.      Medical Decision Making  Procedures    MDM:  This is a 57 year old female with self-reported PMH of seizures who presents today with reported seizure like activity. Patient describes being in an iHOP and reports that there was "a lot of noise" and the "children were screaming so loud" that she felt like she was having a seizure. Denies LOC, denies urinary incontinence. Came to the ED to get "checked out medically."  Physical exam notable for Mood paranoid with tangential and pressured speech, however patient alert and oriented and expressed understanding of situation. Expressed forward thinking.  Findings concerning for possible bipolar disorder, however patient declined psychiatric evaluation.     Due to unknown possible seizure event, labs and head CT were ordered for evaluation. Patient declined these and requested discharge. Patient verbally appropriate and expresses understanding that is sufficient to show capacity for medical decision making at time of exam.  Patient declined crisis evaluation however agreeable to crisis team providing resources. Crisis team provided SW resources and information for [employer] bus ticket. At time of discharge, patient was provided with information on seizure and stroke symptoms for education purposes, however when she received these resources, patient declined discharge and requested labs and head CT that she previously declined earlier.     Labs normal and head CT normal. Patient at her neurologic baseline with normal neurologic exam and normal findings. I do not suspect seizure or stroke as the cause of patients symptoms. Symptoms likely due to untreated psychiatric illness, however no indications at this time that  patient is a harm to herself or others, patient does not currently meet criteria to be held in the emergency department against her will for psychiatric evaluation and treatment. Patient discharged home with strict return precautions given and provided in discharge instructions.     Clinical Impressions as of 11/23/22 1158   Seizure-like activity (HCC)   Paranoia (HCC)   Impulsive     Disposition: Discharge    MDM Complexity   Discussion with external professional (i.e. consult): Crisis/SW/CM  I independently interpreted the imaging obtained during this encounter: Yes, see my interpretation in the above MDM section  The following number of external clinical documents were reviewed: None     Vitals and Results   Vitals    11/23/22 0140 11/23/22 0306   BP: 150/99 (!) 139/118   Pulse: 74 70   Temp: 36.4 C (97.5 F) 36.4 C (97.5 F)   Resp: 18 16   Weight: 121.9 kg (268 lb 11.9 oz)    SpO2: 96% 95%     Results for orders placed or performed during the hospital encounter of 11/23/22   CBC with Platelet Count and Automated Differential   Result Value Ref Range    WBC 6.14 4.30 - 11.30 k/uL    Hemoglobin 11.2 (L) 12.6 - 15.9 g/dL    Hematocrit 01.0 27.2 - 49.0 %    Platelet 317 159 - 439 k/uL    Mean Corpuscular Volume 82.4 81.9 - 101.0 fL    Red Blood Cell 4.48 4.08 - 5.47 M/uL    Mean Corpuscular Hemoglobin 25.0 (L) 25.8 - 33.1 pg    Mean Corpuscular HGB Concentration 30.4 (L) 31.2 - 34.5 g/dL    Red Cell Distribution Width 14.7 11.5 - 15.3 %    Mean Platelet Volume 11.8 8.6 - 12.3 fL    Neutrophil % 46.6 39.4 - 72.5 %    Lymphocyte % 41.5 17.6 - 49.6 %    Monocyte % 5.7 4.1 - 12.4 %    Eosinophil % 5.7 0.4 - 6.7 %    Basophil % 0.3 0.3 - 1.4 %    Neutrophil # 2.86 2.00 - 7.40 k/uL    Lymphocyte # 2.55 1.30 - 3.60 k/uL    Monocyte # 0.35 0.30 - 1.00 k/uL    Eosinophil # 0.35 0.00 - 0.50 k/uL    Basophil # <0.03 0.00 - 0.10 k/uL    NRBC % 0.0 0.0 - 0.0 %    NRBC # <0.01 0.00 - 0.01 k/uL    Immature Granulocytes % 0.2  0.2 - 0.9 %    Immature Granulocytes # <0.03 0.01 - 0.09 k/uL   Basic Metabolic Panel   Result Value Ref Range    Sodium S/P 140 136 - 144 mmol/L    Potassium 3.9 3.3 - 5.0 mmol/L    Chloride 110 102 -  110 mmol/L    Carbon Dioxide 22 20 - 26 mmol/L    BUN 23 8 - 24 mg/dL    Creatinine S/P 0.77 0.57 - 1.11 mg/dL    Glucose 105 64 - 128 mg/dL    Anion Gap 8 8 - 14 mmol/L    Calcium 8.6 8.4 - 10.5 mg/dL    eGFR, CKD-EPI CRT 2021 91 mL/min/1.66m2     Imaging Results           CT Head WO IV Cont (Final result)  Result time 11/23/22 05:58:00      Final result by Debevits, Judson Roch, MD (11/23/22 05:58:00)              Impression:     No acute intracranial hemorrhage. No calvarial fracture.            Narrative:    EXAMINATION: Noncontrast head CT    COMPARISON: No prior studies.    INDICATION:  Seizure, new-onset, no history of trauma; seizure like activity     TECHNIQUE: Axial 4 mm noncontrast images were obtained from the skull base to the vertex, with no contrast administered.    FINDINGS:   There is no evidence of an intra-axial or extra-axial fluid collection. No evidence of acute hemorrhage, mass effect or midline shift is seen. The ventricular system and basal cisterns are patent.    No acute calvarialfracture. The visualized paranasal sinuses are within normal limits. The orbits are grossly unremarkable.                              Elveria Royals, MD'  Emergency Medicine      Loewenberg, Hildred Laser, MD  11/23/22 1200    Electronically signed by Caren Macadam, MD at 11/23/2022 12:00 PM MST

## 2022-11-23 NOTE — ED Notes (Signed)
Formatting of this note might be different from the original.  Upon attempting to room patient to room 25. Patient became increasingly agitated and concerned. Patient started stating to RN "I do not need case management, I don't need your help. I just want to be medically checked out. You don't need to take my stuff".     Weber Cooks, RN  11/23/22 0149    Electronically signed by Weber Cooks, RN at 11/23/2022  1:49 AM MST

## 2022-12-04 DIAGNOSIS — F411 Generalized anxiety disorder: Secondary | ICD-10-CM

## 2022-12-04 NOTE — ED Triage Notes (Signed)
[facility] due to anxiety from traveling today. No SI/HI. No medical needs noted. States concern for having no place to go as cold shelters are closed.

## 2022-12-04 NOTE — ED Provider Notes (Addendum)
Emergency Department Provider Note       PCP: No primary care provider on file.   Age: 57 y.o.   Sex: female     DISPOSITION Decision To Discharge 12/05/2022 01:30:27 AM       ICD-10-CM    1. Anxiety state  F41.1           Medical Decision Making     Complexity of Problems Addressed:  1 or more acute illnesses that pose a threat to life or bodily function.     Data Reviewed and Analyzed:   I independently ordered and reviewed each unique test.  I reviewed external records: provider visit note from PCP.             Discussion of management or test interpretation.    Patient is a 57 year old female with a history of anxiety and depression who states she is traveling from [place] and the cold shelter was closed and it created anxiety and depression.  Patient denies any suicidal or homicidal ideation and denies any auditory visual destinations.  Patient has no medical pain or concerns.  Patient states she only needs to cold shelter.  Patient also states she needs something to eat.  Patient denies any ingestions.  Patient denies any fevers or chills or cold cough congestion or shortness of breath.    Differential diagnosis includes but is not limited to anxiety, depression.    Patient's physical exam is unremarkable patient is ANO x 4.  Patient vital signs are stable.  Patient has no evidence of altered mental status and denies any suicidal homicidal ideations.  Patient denies any auditory or visual destinations.  We will DC and have patient follow-up as needed And to return for any worsening symptoms.        Risk of Complications and/or Morbidity of Patient Management:  Shared medical decision making was utilized in creating the patients health plan today.        Is this patient to be included in the SEP-1 core measure due to severe sepsis or septic shock? No Exclusion criteria - the patient is NOT to be included for SEP-1 Core Measure due to: Infection is not suspected      History      Patient is  a 57 year old female with a history of anxiety and depression who states she is traveling from [place] and the cold shelter was closed and it created anxiety and depression.  Patient denies any suicidal or homicidal ideation and denies any auditory visual destinations.  Patient has no medical pain or concerns.  Patient states she only needs to cold shelter.  Patient also states she needs something to eat.  Patient denies any ingestions.  Patient denies any fevers or chills or cold cough congestion or shortness of breath.         Review of Systems   All other systems reviewed and are negative.      Physical Exam     Vitals signs and nursing note reviewed:  Vitals:    12/04/22 2345 12/05/22 0000 12/05/22 0014 12/05/22 0115   BP: (!) 140/84 130/68 (!) 117/55 118/68   Pulse:       Resp:       Temp:       TempSrc:       SpO2: 98% 100% 99% 99%   Weight:       Height:          Physical Exam  Vitals and nursing note reviewed.   Constitutional:       Appearance: Normal appearance.   HENT:      Head: Normocephalic and atraumatic.      Nose: Nose normal.      Mouth/Throat:      Mouth: Mucous membranes are moist.      Pharynx: Oropharynx is clear.   Eyes:      Extraocular Movements: Extraocular movements intact.      Conjunctiva/sclera: Conjunctivae normal.      Pupils: Pupils are equal, round, and reactive to light.   Cardiovascular:      Rate and Rhythm: Normal rate.      Pulses: Normal pulses.   Pulmonary:      Effort: Pulmonary effort is normal.   Abdominal:      General: Abdomen is flat. Bowel sounds are normal.      Palpations: Abdomen is soft.   Musculoskeletal:         General: Normal range of motion.      Cervical back: Normal range of motion and neck supple.   Skin:     General: Skin is warm.      Capillary Refill: Capillary refill takes less than 2 seconds.   Neurological:      General: No focal deficit present.      Mental Status: She is alert and oriented to person, place, and time. Mental status  is at baseline.   Psychiatric:         Mood and Affect: Mood normal.         Behavior: Behavior normal.         Thought Content: Thought content normal.         Judgment: Judgment normal.          Procedures     Procedures    Orders Placed This Encounter   Procedures    COVID-19, Rapid    CBC with Auto Differential    CMP    Acetaminophen Level    Salicylate    Alcohol        Medications given during this emergency department visit:  Medications - No data to display    New Prescriptions    No medications on file        No past medical history on file.     No past surgical history on file.     Social History     Socioeconomic History    Marital status: Single        Previous Medications    No medications on file        Results for orders placed or performed during the hospital encounter of 12/04/22   COVID-19, Rapid    Specimen: Nasopharyngeal   Result Value Ref Range    Source NASAL      SARS-CoV-2, Rapid Not detected NOTD     CBC with Auto Differential   Result Value Ref Range    WBC 7.9 4.3 - 11.1 K/uL    RBC 4.44 4.05 - 5.2 M/uL    Hemoglobin 11.2 (L) 11.7 - 15.4 g/dL    Hematocrit 36.8 35.8 - 46.3 %    MCV 82.9 82 - 102 FL    MCH 25.2 (L) 26.1 - 32.9 PG    MCHC 30.4 (L) 31.4 - 35.0 g/dL    RDW 14.4 11.9 - 14.6 %    Platelets 218 150 - 450 K/uL    MPV 11.5 9.4 - 12.3 FL  nRBC 0.00 0.0 - 0.2 K/uL    Differential Type AUTOMATED      Neutrophils % 81 (H) 43 - 78 %    Lymphocytes % 10 (L) 13 - 44 %    Monocytes % 4 4.0 - 12.0 %    Eosinophils % 4 0.5 - 7.8 %    Basophils % 0 0.0 - 2.0 %    Immature Granulocytes 0 0.0 - 5.0 %    Neutrophils Absolute 6.5 1.7 - 8.2 K/UL    Lymphocytes Absolute 0.8 0.5 - 4.6 K/UL    Monocytes Absolute 0.3 0.1 - 1.3 K/UL    Eosinophils Absolute 0.3 0.0 - 0.8 K/UL    Basophils Absolute 0.0 0.0 - 0.2 K/UL    Absolute Immature Granulocyte 0.0 0.0 - 0.5 K/UL   CMP   Result Value Ref Range    Sodium 138 136 - 146 mmol/L    Potassium 3.7 3.5 - 5.1 mmol/L    Chloride 109 103 - 113 mmol/L     CO2 25 21 - 32 mmol/L    Anion Gap 4 2 - 11 mmol/L    Glucose 155 (H) 65 - 100 mg/dL    BUN 17 6 - 23 MG/DL    Creatinine 0.90 0.6 - 1.0 MG/DL    Est, Glom Filt Rate >60 >60 ml/min/1.17m2    Calcium 9.0 8.3 - 10.4 MG/DL    Total Bilirubin 0.3 0.2 - 1.1 MG/DL    ALT 54 12 - 65 U/L    AST 61 (H) 15 - 37 U/L    Alk Phosphatase 121 50 - 136 U/L    Total Protein 6.9 6.3 - 8.2 g/dL    Albumin 3.6 3.5 - 5.0 g/dL    Globulin 3.3 2.8 - 4.5 g/dL    Albumin/Globulin Ratio 1.1 0.4 - 1.6     Acetaminophen Level   Result Value Ref Range    Acetaminophen Level <2 (L) 10.0 - 16.1 ug/mL   Salicylate   Result Value Ref Range    Salicylate, Serum <0.9 (L) 2.8 - 20.0 MG/DL   Alcohol   Result Value Ref Range    Ethanol Lvl <3 MG/DL        No orders to display                     Voice dictation software was used during the making of this note.  This software is not perfect and grammatical and other typographical errors may be present.  This note has not been completely proofread for errors.      [address], [place], [place]  12/05/22 0055       Priscille Loveless, MD  12/05/22 0130

## 2022-12-05 ENCOUNTER — Inpatient Hospital Stay
Admit: 2022-12-05 | Discharge: 2022-12-05 | Disposition: A | Discharge status: Home / Self Care | Payer: MEDICARE | Attending: Emergency Medicine

## 2022-12-05 LAB — COMPREHENSIVE METABOLIC PANEL
ALT: 54 U/L (ref 12–65)
AST: 61 U/L — ABNORMAL HIGH (ref 15–37)
Albumin/Globulin Ratio: 1.1 (ref 0.4–1.6)
Albumin: 3.6 g/dL (ref 3.5–5.0)
Alk Phosphatase: 121 U/L (ref 50–136)
Anion Gap: 4 mmol/L (ref 2–11)
BUN: 17 MG/DL (ref 6–23)
CO2: 25 mmol/L (ref 21–32)
Calcium: 9 MG/DL (ref 8.3–10.4)
Chloride: 109 mmol/L (ref 103–113)
Creatinine: 0.9 MG/DL (ref 0.6–1.0)
Est, Glom Filt Rate: >60 mL/min/{1.73_m2} (ref >60)
Globulin: 3.3 g/dL (ref 2.8–4.5)
Glucose: 155 mg/dL — ABNORMAL HIGH (ref 65–100)
Potassium: 3.7 mmol/L (ref 3.5–5.1)
Sodium: 138 mmol/L (ref 136–146)
Total Bilirubin: 0.3 MG/DL (ref 0.2–1.1)
Total Protein: 6.9 g/dL (ref 6.3–8.2)

## 2022-12-05 LAB — CBC WITH AUTO DIFFERENTIAL
Absolute Immature Granulocyte: 0 10*3/uL (ref 0.0–0.5)
Basophils %: 0 % (ref 0.0–2.0)
Basophils Absolute: 0 10*3/uL (ref 0.0–0.2)
Eosinophils %: 4 % (ref 0.5–7.8)
Eosinophils Absolute: 0.3 10*3/uL (ref 0.0–0.8)
Hematocrit: 36.8 % (ref 35.8–46.3)
Hemoglobin: 11.2 g/dL — ABNORMAL LOW (ref 11.7–15.4)
Immature Granulocytes: 0 % (ref 0.0–5.0)
Lymphocytes %: 10 % — ABNORMAL LOW (ref 13–44)
Lymphocytes Absolute: 0.8 10*3/uL (ref 0.5–4.6)
MCH: 25.2 PG — ABNORMAL LOW (ref 26.1–32.9)
MCHC: 30.4 g/dL — ABNORMAL LOW (ref 31.4–35.0)
MCV: 82.9 FL (ref 82–102)
MPV: 11.5 FL (ref 9.4–12.3)
Monocytes %: 4 % (ref 4.0–12.0)
Monocytes Absolute: 0.3 10*3/uL (ref 0.1–1.3)
Neutrophils %: 81 % — ABNORMAL HIGH (ref 43–78)
Neutrophils Absolute: 6.5 10*3/uL (ref 1.7–8.2)
Platelets: 218 10*3/uL (ref 150–450)
RBC: 4.44 M/uL (ref 4.05–5.2)
RDW: 14.4 % (ref 11.9–14.6)
WBC: 7.9 10*3/uL (ref 4.3–11.1)
nRBC: 0 10*3/uL (ref 0.0–0.2)

## 2022-12-05 LAB — COVID-19, RAPID: SARS-CoV-2, Rapid: NOT DETECTED

## 2022-12-05 LAB — SALICYLATE LEVEL: Salicylate, Serum: <1.7 MG/DL — ABNORMAL LOW (ref 2.8–20.0)

## 2022-12-05 LAB — ACETAMINOPHEN LEVEL: Acetaminophen Level: <2 ug/mL — ABNORMAL LOW (ref 10.0–30.0)

## 2022-12-05 LAB — ETHANOL: Ethanol Lvl: <3 MG/DL

## 2022-12-05 NOTE — Discharge Instructions (Addendum)
We would love to help you get a primary care doctor for follow-up after your emergency department visit.      Please call [phone] between 7AM - 6PM Monday to Friday.  A care navigator will be able to assist you with setting up a doctor close to your home.

## 2022-12-05 NOTE — ED Notes (Signed)
I have reviewed discharge instructions with the patient.  The patient verbalized understanding.    Patient left ED via Discharge Method: ambulatory to Home with self.    Opportunity for questions and clarification provided.       Patient given 0 scripts.         To continue your aftercare when you leave the hospital, you may receive an automated call from our care team to check in on how you are doing.  This is a free service and part of our promise to provide the best care and service to meet your aftercare needs." If you have questions, or wish to unsubscribe from this service please call [phone].  Thank you for Choosing our [facility].        Latanya Presser, RN  12/05/22 [phone]

## 2023-08-18 ENCOUNTER — Other Ambulatory Visit: Payer: Self-pay

## 2024-05-09 ENCOUNTER — Other Ambulatory Visit (HOSPITAL_BASED_OUTPATIENT_CLINIC_OR_DEPARTMENT_OTHER): Payer: Self-pay
